# Patient Record
Sex: Female | Born: 1990 | Race: White | Hispanic: No | Marital: Single | Smoking: Former smoker
Health system: Southern US, Community
[De-identification: ages and names within clinical notes are randomized; demographics above are authoritative.]

## PROBLEM LIST (undated history)

## (undated) ENCOUNTER — Inpatient Hospital Stay (HOSPITAL_COMMUNITY): Payer: Self-pay

## (undated) ENCOUNTER — Ambulatory Visit: Admission: EM | Payer: Medicaid Other | Source: Home / Self Care

## (undated) DIAGNOSIS — F4310 Post-traumatic stress disorder, unspecified: Secondary | ICD-10-CM

## (undated) DIAGNOSIS — S0300XA Dislocation of jaw, unspecified side, initial encounter: Secondary | ICD-10-CM

## (undated) DIAGNOSIS — G43909 Migraine, unspecified, not intractable, without status migrainosus: Secondary | ICD-10-CM

## (undated) DIAGNOSIS — L501 Idiopathic urticaria: Secondary | ICD-10-CM

## (undated) DIAGNOSIS — Z8489 Family history of other specified conditions: Secondary | ICD-10-CM

## (undated) DIAGNOSIS — R569 Unspecified convulsions: Secondary | ICD-10-CM

## (undated) DIAGNOSIS — F319 Bipolar disorder, unspecified: Secondary | ICD-10-CM

## (undated) DIAGNOSIS — R42 Dizziness and giddiness: Secondary | ICD-10-CM

## (undated) DIAGNOSIS — N200 Calculus of kidney: Secondary | ICD-10-CM

## (undated) DIAGNOSIS — O24419 Gestational diabetes mellitus in pregnancy, unspecified control: Secondary | ICD-10-CM

## (undated) DIAGNOSIS — Z87898 Personal history of other specified conditions: Secondary | ICD-10-CM

## (undated) DIAGNOSIS — I10 Essential (primary) hypertension: Secondary | ICD-10-CM

## (undated) DIAGNOSIS — D171 Benign lipomatous neoplasm of skin and subcutaneous tissue of trunk: Secondary | ICD-10-CM

## (undated) DIAGNOSIS — Q7962 Hypermobile Ehlers-Danlos syndrome: Secondary | ICD-10-CM

## (undated) DIAGNOSIS — F419 Anxiety disorder, unspecified: Secondary | ICD-10-CM

## (undated) DIAGNOSIS — F329 Major depressive disorder, single episode, unspecified: Secondary | ICD-10-CM

## (undated) DIAGNOSIS — R000 Tachycardia, unspecified: Secondary | ICD-10-CM

## (undated) DIAGNOSIS — F32A Depression, unspecified: Secondary | ICD-10-CM

## (undated) DIAGNOSIS — J45909 Unspecified asthma, uncomplicated: Secondary | ICD-10-CM

## (undated) HISTORY — PX: ADENOIDECTOMY, TONSILLECTOMY AND MYRINGOTOMY WITH TUBE PLACEMENT: SHX5716

## (undated) HISTORY — PX: ABDOMINAL HYSTERECTOMY: SHX81

## (undated) HISTORY — PX: APPENDECTOMY: SHX54

## (undated) HISTORY — PX: TONSILLECTOMY: SUR1361

## (undated) HISTORY — DX: Calculus of kidney: N20.0

## (undated) HISTORY — PX: WISDOM TOOTH EXTRACTION: SHX21

## (undated) HISTORY — DX: Post-traumatic stress disorder, unspecified: F43.10

## (undated) HISTORY — PX: HIP SURGERY: SHX245

---
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
## 2008-01-20 ENCOUNTER — Emergency Department (HOSPITAL_COMMUNITY): Admission: EM | Admit: 2008-01-20 | Discharge: 2008-01-20 | Payer: Self-pay | Admitting: Emergency Medicine

## 2008-02-07 ENCOUNTER — Ambulatory Visit: Payer: Self-pay | Admitting: Family Medicine

## 2008-02-07 ENCOUNTER — Other Ambulatory Visit: Admission: RE | Admit: 2008-02-07 | Discharge: 2008-02-07 | Payer: Self-pay | Admitting: Family Medicine

## 2008-02-07 ENCOUNTER — Encounter: Payer: Self-pay | Admitting: Family Medicine

## 2008-02-07 DIAGNOSIS — F17200 Nicotine dependence, unspecified, uncomplicated: Secondary | ICD-10-CM | POA: Insufficient documentation

## 2008-02-07 HISTORY — DX: Nicotine dependence, unspecified, uncomplicated: F17.200

## 2008-02-14 ENCOUNTER — Encounter: Payer: Self-pay | Admitting: Family Medicine

## 2008-02-24 ENCOUNTER — Encounter: Payer: Self-pay | Admitting: Family Medicine

## 2008-02-26 ENCOUNTER — Telehealth: Payer: Self-pay | Admitting: *Deleted

## 2008-03-03 ENCOUNTER — Ambulatory Visit: Payer: Self-pay | Admitting: Family Medicine

## 2008-03-03 LAB — CONVERTED CEMR LAB: Beta hcg, urine, semiquantitative: NEGATIVE

## 2008-03-06 ENCOUNTER — Encounter: Payer: Self-pay | Admitting: *Deleted

## 2008-04-15 ENCOUNTER — Ambulatory Visit: Payer: Self-pay | Admitting: Family Medicine

## 2008-04-15 LAB — CONVERTED CEMR LAB: Beta hcg, urine, semiquantitative: NEGATIVE

## 2008-04-21 ENCOUNTER — Ambulatory Visit: Payer: Self-pay | Admitting: Family Medicine

## 2008-05-08 ENCOUNTER — Ambulatory Visit (HOSPITAL_COMMUNITY): Admission: RE | Admit: 2008-05-08 | Discharge: 2008-05-08 | Payer: Self-pay | Admitting: Family Medicine

## 2008-05-17 ENCOUNTER — Emergency Department (HOSPITAL_COMMUNITY): Admission: EM | Admit: 2008-05-17 | Discharge: 2008-05-17 | Payer: Self-pay | Admitting: Emergency Medicine

## 2008-05-18 ENCOUNTER — Telehealth (INDEPENDENT_AMBULATORY_CARE_PROVIDER_SITE_OTHER): Payer: Self-pay | Admitting: *Deleted

## 2008-06-10 ENCOUNTER — Encounter: Payer: Self-pay | Admitting: Family Medicine

## 2008-06-10 ENCOUNTER — Ambulatory Visit: Payer: Self-pay | Admitting: Family Medicine

## 2008-06-10 LAB — CONVERTED CEMR LAB
ALT: 22 units/L (ref 0–35)
AST: 17 units/L (ref 0–37)
Albumin: 4.6 g/dL (ref 3.5–5.2)
Alkaline Phosphatase: 94 units/L (ref 39–117)
BUN: 13 mg/dL (ref 6–23)
Basophils Absolute: 0 10*3/uL (ref 0.0–0.1)
Basophils Relative: 0 % (ref 0–1)
Beta hcg, urine, semiquantitative: NEGATIVE
CO2: 20 meq/L (ref 19–32)
Calcium: 9.8 mg/dL (ref 8.4–10.5)
Chloride: 107 meq/L (ref 96–112)
Creatinine, Ser: 0.76 mg/dL (ref 0.40–1.20)
Eosinophils Absolute: 0 10*3/uL (ref 0.0–0.7)
Eosinophils Relative: 1 % (ref 0–5)
Glucose, Bld: 111 mg/dL — ABNORMAL HIGH (ref 70–99)
HCT: 43.5 % (ref 36.0–46.0)
Hemoglobin: 13.8 g/dL (ref 12.0–15.0)
Lymphocytes Relative: 29 % (ref 12–46)
Lymphs Abs: 2.3 10*3/uL (ref 0.7–4.0)
MCHC: 31.7 g/dL (ref 30.0–36.0)
MCV: 89 fL (ref 78.0–100.0)
Monocytes Absolute: 0.6 10*3/uL (ref 0.1–1.0)
Monocytes Relative: 8 % (ref 3–12)
Neutro Abs: 5.1 10*3/uL (ref 1.7–7.7)
Neutrophils Relative %: 63 % (ref 43–77)
Platelets: 254 10*3/uL (ref 150–400)
Potassium: 4.7 meq/L (ref 3.5–5.3)
RBC: 4.89 M/uL (ref 3.87–5.11)
RDW: 14.9 % (ref 11.5–15.5)
Sodium: 140 meq/L (ref 135–145)
Total Bilirubin: 0.3 mg/dL (ref 0.3–1.2)
Total Protein: 7.6 g/dL (ref 6.0–8.3)
WBC: 8.1 10*3/uL (ref 4.0–10.5)

## 2008-06-11 ENCOUNTER — Encounter: Payer: Self-pay | Admitting: Family Medicine

## 2009-01-26 ENCOUNTER — Emergency Department (HOSPITAL_COMMUNITY): Admission: EM | Admit: 2009-01-26 | Discharge: 2009-01-26 | Payer: Self-pay | Admitting: Emergency Medicine

## 2009-04-16 ENCOUNTER — Encounter: Payer: Self-pay | Admitting: Family Medicine

## 2009-04-16 ENCOUNTER — Ambulatory Visit: Payer: Self-pay | Admitting: Family Medicine

## 2009-04-16 ENCOUNTER — Telehealth: Payer: Self-pay | Admitting: Family Medicine

## 2010-01-20 ENCOUNTER — Emergency Department: Payer: Self-pay | Admitting: Emergency Medicine

## 2010-02-02 ENCOUNTER — Ambulatory Visit: Payer: Self-pay | Admitting: Family Medicine

## 2010-02-02 ENCOUNTER — Encounter: Payer: Self-pay | Admitting: Family Medicine

## 2010-02-02 LAB — CONVERTED CEMR LAB
BUN: 11 mg/dL (ref 6–23)
CO2: 25 meq/L (ref 19–32)
Calcium: 9.3 mg/dL (ref 8.4–10.5)
Chloride: 104 meq/L (ref 96–112)
Creatinine, Ser: 0.69 mg/dL (ref 0.40–1.20)
Glucose, Bld: 103 mg/dL — ABNORMAL HIGH (ref 70–99)
Potassium: 4.1 meq/L (ref 3.5–5.3)
Sodium: 138 meq/L (ref 135–145)

## 2010-02-07 ENCOUNTER — Telehealth: Payer: Self-pay | Admitting: Family Medicine

## 2010-03-02 ENCOUNTER — Ambulatory Visit: Payer: Self-pay | Admitting: Family Medicine

## 2010-03-02 DIAGNOSIS — F4310 Post-traumatic stress disorder, unspecified: Secondary | ICD-10-CM

## 2010-03-02 HISTORY — DX: Post-traumatic stress disorder, unspecified: F43.10

## 2010-03-02 LAB — CONVERTED CEMR LAB: Beta hcg, urine, semiquantitative: NEGATIVE

## 2010-06-04 ENCOUNTER — Emergency Department: Payer: Self-pay | Admitting: Emergency Medicine

## 2010-06-24 ENCOUNTER — Emergency Department: Payer: Self-pay | Admitting: Emergency Medicine

## 2010-06-26 ENCOUNTER — Telehealth: Payer: Self-pay | Admitting: Family Medicine

## 2010-08-07 ENCOUNTER — Encounter: Payer: Self-pay | Admitting: *Deleted

## 2010-08-07 ENCOUNTER — Encounter: Payer: Self-pay | Admitting: Family Medicine

## 2010-08-16 NOTE — Miscellaneous (Signed)
Summary: needs postpartum pap  Clinical Lists Changes   Pt with hx of colpo with CIN I, then prenatal pap with [facility].  Given pt's age, will repeat pap 3 months postpartum and depending on results, will consider repeat colpo.

## 2010-08-16 NOTE — Assessment & Plan Note (Signed)
Summary: hematuria, lt side pain, pregnant/ls    PCP:  Altamese Cabal MD   History of Present Illness: S: Patient is a 20 y/o G2P1 at 13 weeks here todya for question of recent bleeding. Patient went to the bathroom about 1.5 hours ago and noticed blood tinged fluid in water and she came immediately to the [facility].  Patient also complains of intermittant l-sided pain in lower abdomen that is sharp.  No recent intercourse. No further bleeding since that episode. Denies cramping and ctx. No dysuria or flank pain or fevers.  FHTs 160s and fundal height 13 today    Past Medical History:    Reviewed history from 05/26/2007 and no changes required:        mild zoster in March 2007       SVD 1/08       depressive disorder NOS       abnl pap-LGSIL 2007, s/p colpo-->CIN I       ASCUS-H pap 10/08 (patient pregnant) needs f/u pap 3 months postpartum  Past Surgical History:    Reviewed history from 05/03/2007 and no changes required:       none      Physical Exam  General:     Well-developed,well-nourished,in no acute distress; alert,appropriate and cooperative throughout examination. tearful and anxious Abdomen:     Bowel sounds positive,abdomen soft and non-tender without masses, organomegaly. Gravid 13 cm uterus. FHTs 160s. No CVAT tenderness Genitalia:     Normal introitus for age, no external lesions, no vaginal discharge, mucosa pink and moist, no vaginal or cervical lesions, no vaginal atrophy, no friaility or hemorrhage, 13 cm  uterus size and position, no adnexal masses or tenderness. cervix appears closed Skin:     Intact without suspicious lesions or rashes Psych:     tearful and slightly anxious.      Impression & Recommendations:  Problem # 1:  HEMATURIA UNSPECIFIED (ICD-599.70) Assessment: New UA normal and cervix closed and no evidence of bleeding. Reassured patient.  She will call if symptoms return. Discussed case with preceptor. Orders: Urinalysis-[facility] (00000) [facility]-  Est Level  3 (16109)    Patient Instructions: 1)  you cervix is closed and there were no signs of bleeing on your internal exam or in your urine. 2)  You are still dehydrated and need to drink water 3)  If this happens again, call the office or go to the [facility] if it is after hours    ] Laboratory Results   Urine Tests  Date/Time Received: July 03, 2007 3.48  PM  Date/Time Reported: July 03, 2007 4:07 PM   Routine Urinalysis   Color: yellow Appearance: Clear Glucose: negative   (Normal Range: Negative) Bilirubin: negative   (Normal Range: Negative) Ketone: moderate (40)   (Normal Range: Negative) Spec. Gravity: 1.015   (Normal Range: 1.003-1.035) Blood: negative   (Normal Range: Negative) pH: 7.0   (Normal Range: 5.0-8.0) Protein: negative   (Normal Range: Negative) Urobilinogen: 0.2   (Normal Range: 0-1) Nitrite: negative   (Normal Range: Negative) Leukocyte Esterace: negative   (Normal Range: Negative)    Comments: ...............test performed by......Marland KitchenBonnie A. [place], [place] (ASCP)

## 2010-08-16 NOTE — Progress Notes (Signed)
Summary: triage  Phone Note Call from Patient Call back at [phone]   Caller: mom-Jennifer Summary of Call: pt is having sore throat for 2 days - wants to come in today Initial call taken by: De Nurse,  April 16, 2009 10:03 AM  Follow-up for Phone Call        mom states she has had this for 2-3 days & has lost her voice. needs note for work. appt at 3 as work in Follow-up by: Golden Circle RN,  April 16, 2009 10:09 AM
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
## 2010-08-16 NOTE — Assessment & Plan Note (Signed)
Summary: NOB   OB Initial Intake Information    Positive HCG by: at [facility]    Race: White    Marital status: Single    Occupation: Arts development officer    Number of children at home: 1    [facility]    Newborn's physician: samuhel  FOB Information    Husband/Father of baby: dustin giles    FOB occupation warehouse    FOB Comments: supportive  Menstrual History    LMP (date): 03/29/2007    EDC by LMP: 01/03/2008    Best Working EDC: 01/03/2008    LMP - Character: normal    LMP - Reliable? : Yes    Menarche: 10 years    Menses interval: 30 days    Menstrual flow 6 days    On BCP's at conception: yes    Pre Pregnancy Weight: 167 lbs.    Symptoms since LMP: nausea    Other symptoms: menstrual cramping pains. no bleeding  Past Medical History:    Reviewed history from 05/03/2007 and no changes required:        mild zoster in March 2007       SVD 1/08       depressive disorder NOS       abnl pap-LGSIL 2007, s/p colpo,  needs repeat  pap 11/08  Past Surgical History:    Reviewed history from 05/03/2007 and no changes required:       none  Past Pregnancy History    Gravida:     2    Term Births:     1    Premature Births:   0    Living Children:   1    Para:       1    Mult. Births:     0    Aborta:     0    Elect. Ab:     0  Pregnancy # 1    Delivery date:     08/08/2006    Weeks Gestation:   39    Preterm labor:     no    Delivery type:     NSVD    Hours of labor:     24    Anesthesia type:     epidural    [facility]    Infant Sex:     Female    Birth weight:     6 lbs 3 ounces    Name:     Jada    Comments:     cleft palate  Family History:    obesity, tobacco use, sister has seizure d/o and developmental delay    other sister has Type II DM    child has cleft palate    mom DM II, etoh abuse    great GF and GM - CAD, CHF  Genetic History    Father of baby:   dustin giles     Thalassemia:     mother: no    father: no    Neural tube defect:   mother: no   father: no    Down's Syndrome:   mother: no   father: no    Tay-Sachs:     mother: no   father: no    Sickle Cell Dz/Trait:   mother: no   father: no    Hemophilia:     mother: no   father: no    Muscular  Dystrophy:   mother: no   father: no    Cystic Fibrosis:   mother: no   father: no    Huntington's Dz:   mother: no   father: no    Mental Retardation:   mother: yes   father: no    Fragile X:     mother: no   father: no    Other Genetic or       Chromosomal Dz:   mother: no   father: no    Child with other       birth defect:     mother: yes   father: no    > 3 spont. abortions:   mother: no    Hx of stillbirth:     mother: no  Additional Genetic Comments:    patients first child had cleft palate patients sister has mental retardation(white matter delay) and seizure discorder  Flowsheet View for Follow-up Visit    Estimated weeks of       gestation:     6 5/7    Weight:     172    Blood pressure:   114 / 69  Physical Examination  Vital Signs:  BP (upright): 114/69  Wt: 172  Last Ht: 62.5 (11/02/2006)  General Exam:  Constitutional:    alert, no acute distress, well hydrated, well developed, well nourished, appropriate dress, and OBESE APPEARING.   Skin:    normal turgor, normal color, no rashes, no lesions, and no unusual bruising.   Head:    atraumatic and normocephalic.   Eyes:    EOM intact, PERRLA, and no injection.   Mouth:    good dentition, no erythema, no exudates, and no lesions.   Neck:    supple.   Cardiovascular:    RRR, no murmurs, no gallops, peripheral pulses intact, and no edema.   Respiratory:    no respiratory distress and clear to auscultation.   Abdomen:    gravid, normal BS, and no hepatosplenomegaly.   Psychiatric:    oriented to all spheres, affect and mood appropriate, normal interaction, and good eye contact.    Impression & Recommendations:  Problem # 1:  PREGNANCY, MULTIGRAVIDA  (ICD-V22.1) Assessment: Comment Only Patient about 6 weeks. Will do 1 hr GTT at next visit since she has h/o GDM. Will refer to MFM for integrative screening Orders: Prenatal-[facility] ([address]) Urine Culture-[facility] ([id number]) HIV-[facility] ([id number]) GC/Chlamydia-[facility] (87591/87491) Pap Smear-[facility] ([id number]) Medicaid OB visit - [facility] ([id number]) Obstetric Referral (Obstetric)   Patient Instructions: 1)  please f/u in 4 weeks 2)  we will do a sugar test at that time 3)  call if you have any questions or concerns Prenatal Visit    FOB name: dustin giles Concerns noted: patient having mild menstrual-like cramps. no vaginal bleeding EDC Confirmation:    New working [facility]: 01/03/2008    LMP reliable? Yes    Last menses onset (LMP) date: 03/29/2007    EDC by LMP: 01/03/2008

## 2010-08-16 NOTE — Miscellaneous (Signed)
Summary: PCP Change  Pt requested a female provider.  Have reassigned her and her 2 children to Dr. Helane Rima. Dennison Nancy RN  March 06, 2008 9:34 AM

## 2010-08-16 NOTE — Assessment & Plan Note (Signed)
Summary: IUD insertion/ACM   Vital Signs:  Patient Profile:   20 Years Old Female Height:     62.5 inches (158.75 cm) Weight:      175.8 pounds Temp:     97.9 degrees F Pulse rate:   97 / minute BP sitting:   109 / 75  (left arm)  Pt. in pain?   no  Vitals Entered By: Alphia Kava (March 03, 2008 9:02 AM)              Is Patient Diabetic? No     PCP:  Myrtie Soman  MD  Chief Complaint:  IUD Insertion.  History of Present Illness: here for iud--previously has tried ocp--desires iud. No questions.  Post partum--having some menses now. Smoker.    Past Medical History:     mild zoster in March 2007    NSVD x 2 1/08 and 6/09    depressive disorder NOS    abnl pap-LGSIL 2007, s/p colpo-->CIN I    ASCUS-H pap 10/08 (patient pregnant) needs f/u pap postpartum  Past Surgical History:    Reviewed history from 05/03/2007 and no changes required:       none    Risk Factors:     Counseled to quit/cut down tobacco use:  yes     Physical Exam  Genitalia:     Bright red blood oozing in small quantity from os.normal introitus, no external lesions, no vaginal or cervical lesions, normal uterus size and position, and no adnexal masses or tenderness.   Additional Exam:     Patient given informed consent for IUD insertion. She has no questions. Signed copy in chart. Patient placed in lithotomy position. Sterile prep and sterile speculum/equipment used. Cervix cleansed X 3 with betadine. Tenaculum used to secure cervix by placement in anterior lip of cervix. Some blood oozing from tthe os noted. Uterine sound used and sounded to 6 and then IUD placed without problems.IUD dislodged wit attempt to trim strings and so a second Mirena was inserted using continuous steril technique, IUD strings trimmed to 2 inches and patient taught how to check for them. Her os was relatively open and so I think she is at risk for early expulsion. We will have her come back in 2 weeks to see Dr  Constance Goltz for a string check.     Impression & Recommendations:  Problem # 1:  CONTRACEPTIVE MANAGEMENT (ICD-V25.09)  Orders: U Preg-[facility] (29518) IUD insert- [facility] (84166)   Complete Medication List: 1)  Ortho Tri-cyclen (28) 0.035 Mg Tabs (Norgestimate-ethinyl estradiol) .... One by mouth daily; dispense one month's supply 2)  Ibuprofen 400 Mg Tabs (Ibuprofen) .... One by mouth 3-4 times per day as needed for back pain   Patient Instructions: 1)  Please schedule a follow-up appointment in 2 weeks for IUD string check with Dr. Constance Goltz (okay to double book). 2)  One of the most important things you can do for your health is to STOP SMOKING.  When you are ready to quit smoking, please call the "quit line" at 1-800-QUIT-NOW (580-538-2160).  You can get more information about the quit line at [email or web address].  It is free!   ] Laboratory Results   Urine Tests  Date/Time Received: March 03, 2008 9:22 AM  Date/Time Reported: March 03, 2008 9:32 AM      Urine HCG: negative Comments: ...........test performed by...........Marland KitchenTerese Door, CMA     Appended Document: IUD insertion/ACM 1st IUD wasted when attempting to cut strings.  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  IUD lot # [id number] exp 01/12................................ JESSICA FLEEGER CMA  March 03, 2008 1:01 PM    Clinical Lists Changes  Orders: Added new Test order of IUD Supply-[facility] (I6270) - Signed Added new Test order of IUD Supply-[facility] (J5009) - Signed

## 2010-08-16 NOTE — Progress Notes (Signed)
Summary: Letter for [facility]  Phone Note Call from Patient Call back at Home Phone [phone]   Summary of Call: Pt is requesting a letter to prove she is pregnant to take to [facility].  Pt has an appt at 1pm this afternoon with them. Initial call taken by: Haydee Salter,  May 13, 2007 8:35 AM  Follow-up for Phone Call        LVM informing pt. Letter ready to be pick up @ front desk Follow-up by: Tomasa Rand,  May 13, 2007 9:07 AM

## 2010-08-16 NOTE — Assessment & Plan Note (Signed)
Summary: cannot hear our of ear x1wk wp   Vital Signs:  Patient Profile:   20 Years Old Female Height:     62.5 inches (158.75 cm) Weight:      178 pounds Temp:     98.4 degrees F Pulse rate:   109 / minute BP sitting:   116 / 74  Pt. in pain?   no  Vitals Entered By: Jone Baseman CMA (Nov 22, 2007 2:33 PM)                Audiometry Screening        Left  500 hz: 20db 1000 hz: 20db 2000 hz: 20db 4000 hz: 20db Right  500 hz: No Response 1000 hz: No Response 2000 hz: No Response 4000 hz: No Response   Hearing Testing Entered By: Jone Baseman CMA (Nov 22, 2007 2:40 PM)   PCP:  Altamese Cabal MD  Chief Complaint:  can't hear out of right ear x 10 days.  History of Present Illness: S: Patient is a 20 y/o female diagnosed with acute OM with TM rupture about 10 days ago. Recently finished course of oral antibiotic. Still having drainage from ear. Denies infection and pain. patient has h/o tympanosty tubes x 3 as a child. She has had 2 ear infections in the past 3 years. Currently smoking 1-2 cigarettes a day.       Risk Factors:  Tobacco use:  current    Cigarettes:  Yes -- occ pack(s) per day    Counseled to quit/cut down tobacco use:  yes    Physical Exam  General:     Well-developed,well-nourished,in no acute distress; alert,appropriate and cooperative throughout examination. pregnant Head:     normocephalic and atraumatic.   Ears:     R TM with abut 15 % TM rupture and some fluid drainage. left TM normal    Impression & Recommendations:  Problem # 1:  HEARING LOSS, RIGHT EAR (ICD-389.9) Assessment: New patient has decreased hearing from OM that resulted in TM rupture about 10 days ago. Will refer to ENT [phone][facility] ENT Referral (ENT) [facility]- Est Level  3 (76734)     ]

## 2010-08-16 NOTE — Assessment & Plan Note (Signed)
Summary: LLQ pain x 2 days   Vital Signs:  Patient Profile:   20 Years Old Female Height:     62.5 inches (158.75 cm) Weight:      165.1 pounds BMI:     29.82 Temp:     98.3 degrees F Pulse rate:   106 / minute BP sitting:   102 / 64  Pt. in pain?   no  Vitals Entered By: Golden Circle RN (January 25, 2007 10:05 AM)                PCP:  Altamese Cabal MD  Chief Complaint:  llq pain x 2-3 days-worse with movement and better with rest.  History of Present Illness: S Left groin pain x 2-3 days.  No trauma or overexertion per patient report.  Pain worse when lifting baby .  Patient has not taken anything for pain.  Actually it is getting better.   Patient also notes she is thinking about chagning contraceptive methods and is interested in the Implanon.     Family History:    Reviewed history from 09/13/2006 and no changes required:       obesity, tobacco use, sister has seizure d/o and developmental delay       other sister has Type II DM       child has cleft palate       mom DM II, etoh abuse  Social History:    Reviewed history from 09/13/2006 and no changes required:       G1P1 (2008, girl)  Poor school performance.  Has BF who has graduated from [employer], works at [employer].  Monogamous relationship, began sexual activity 2004.  H/o tobacco use, none currently.  No EtOH, recreational drugs.     Physical Exam  General:      Well appearing adolescent,no acute distress Musculoskeletal:      full ROM in left hip.  No pain on exam    Impression & Recommendations:  Problem # 1:  SPRAIN/STRAIN  NEC (ICD-848.8) Assessment: New Strained groin. See patient instructions Orders: [facility]- Est Level  3 (54098)    Patient Instructions: 1)  Yhave pulled a muscle in your left groin. 2)  Ace wrap that leg during the day and ice at ngiht. 3)  Exercises :  lay on side and lift leg up multiple times.  THen stand up and move leg in opposite direction.  Also sweeze ball betweeen  you legs.Marland Kitchen 4)  If you decide you want the implanon please call office and ask them to get me to do a referal 5)  F/U with me in 2 months

## 2010-08-16 NOTE — Letter (Signed)
Summary: Out of Work  [facility]  [address]   [place], [place] [address]   Phone: [phone]  Fax: [phone]    April 16, 2009   Employee:  ALAYZHA AN    To Whom It May Concern:   For Medical reasons, please excuse the above named employee from work for the following dates:  Start:   April 15, 2009 End:  April 17, 2009  If you need additional information, please feel free to contact our office.       Sincerely,    Romero Belling MD

## 2010-08-16 NOTE — Assessment & Plan Note (Signed)
Summary: diarrhea wp   Vital Signs:  Patient Profile:   20 Years Old Female Height:     62.5 inches (158.75 cm) Weight:      164.56 pounds BMI:     29.73 Temp:     98.1 degrees F Pulse rate:   94 / minute BP sitting:   108 / 78  (left arm) Cuff size:   regular  Pt. in pain?   yes    Location:   lower abdomen    Intensity:   5    Type:       cramping  Vitals Entered By: Dennison Nancy RN (June 10, 2008 9:55 AM)              Is Patient Diabetic? No     PCP:  Helane Rima MD  Chief Complaint:  Work in for diarrhea.  History of Present Illness: History of present illness is detailed by problem in assessment and plan.    Updated Prior Medication List: LOPERAMIDE HCL 2 MG CAPS (LOPERAMIDE HCL) 1 tab by mouth after every loose stool, maximum of 4 per day PROCTOFOAM HC 1-1 % FOAM (HYDROCORTISONE ACE-PRAMOXINE) Use per manufacturer's instructions as needed for anal pain.  Disp #1 bottle.  Current Allergies (reviewed today): ! CODEINE    Risk Factors:     Counseled to quit/cut down tobacco use:  yes    Physical Exam  General:     Well-developed,well-nourished,in no acute distress; alert,appropriate and cooperative throughout examination Head:     Normocephalic and atraumatic without obvious abnormalities. No apparent alopecia or balding. Mouth:     Moist mucous membranes. Abdomen:     Soft, nontender, normoactive bowel sounds with no masses or hepatosplenomegaly.    Impression & Recommendations:  Problem # 1:  ABDOMINAL CRAMPS (ICD-789.00) Assessment: New 1 month history of abdominal cramps and diarrhea.  Had an episode of vomiting and diarrhea on 10/31 and was seen in ED--told she had gastroenteritis that would be self-limiting.  Nausea persisted for 2 days after that and vomiting resolved.  However, patient has had persistent postprandial diarrhea since.  Occurs approximately three times a day after every meal.  Diarrhea is watery, nonbloody, brown  without fat.  No history of camping or drinking from streams.  Preceded by middle lower abdominal cramping that is relieved after defecation.  Has IUD (placement confirmed by Ultrasound), is sexually active with 1 partner, no vaginal discharge or bleeding.  Periods are monthly and last 2 weeks, no dysmenorrhea.  UPT is negative today.  No fever/chills.  Drinking plenty of fluids.  Has perianal pain during defecation.  A/P:  Unclear cause, but is low risk for parasites.  Consider IBS given relief with defecation and lack of systemic symptoms.    Will check stool O&P, WBC, and culture.  Loperamide for relief, Proctofoam for anal pain, maintain good hydration.  If persists, would consider testing for Celiac disease as well. Orders: U Preg-[facility] ([id number])   Complete Medication List: 1)  Loperamide Hcl 2 Mg Caps (Loperamide hcl) .Marland Kitchen.. 1 tab by mouth after every loose stool, maximum of 4 per day 2)  Proctofoam Hc 1-1 % Foam (Hydrocortisone ace-pramoxine) .... Use per manufacturer's instructions as needed for anal pain.  disp #1 bottle.  Other Orders: Comp Met-[facility] [id number]) CBC w/Diff-[facility] ([id number]) [facility]- Est Level  3 (95284)  Future Orders: Culture, Stool- [facility] ([id number]) ... 06/10/2009 Culture, Stool Giardia/Cryptosporidium-[facility] [id number]) ... 06/10/2009 Stool, WBC/Lactoferrin-[facility] ([id number]) ... 06/10/2009   Patient Instructions: 1)  We  are checking some stool studies today to determine why you are having so much diarrhea.  Our lab will give you instructions. 2)  Please continue to drink plenty of fluids to avoid dehydration. 3)  I have prescribed loperamide which you can use to try to relieve the diarrhea. 4)  I have also prescribed proctofoam which can help with the anal pain/burning. 5)  Please schedule a follow-up appointment in 1 month with Dr. Earlene Plater.   Prescriptions: PROCTOFOAM HC 1-1 % FOAM (HYDROCORTISONE ACE-PRAMOXINE) Use per manufacturer's instructions as needed for anal pain.   Disp #1 bottle.  #1 x 0   Entered and Authorized by:   Romero Belling MD   Signed by:   Romero Belling MD on 06/10/2008   Method used:   Electronically to        [facility] [address] [phone]* (retail)       [address]       [place], [place]  [address]       Ph: [phone] or [phone]       Fax: [phone]   RxID:   [id number] LOPERAMIDE HCL 2 MG CAPS (LOPERAMIDE HCL) 1 tab by mouth after every loose stool, maximum of 4 per day  #100 x 0   Entered and Authorized by:   Romero Belling MD   Signed by:   Romero Belling MD on 06/10/2008   Method used:   [facility]        [facility] [address] [phone]* (retail)       [address]       [place], [place]  [address]       Ph: [phone] or [phone]       Fax: [phone]   RxID:   [id number]  ]  Laboratory Results   Urine Tests  Date/Time Received: June 10, 2008 10:04 AM  Date/Time Reported: June 10, 2008 10:09 AM     Urine HCG: negative Comments: ...........test performed by...........Marland KitchenTerese Door, CMA

## 2010-08-16 NOTE — Assessment & Plan Note (Signed)
Summary: f/u ob  Prenatal Visit Concerns noted: tired. checking blood sugar 120 at the highest fasting.   following diabetic diet.  Flowsheet View for Follow-up Visit    Estimated weeks of       gestation:     12 6/7    Weight:     163    Blood pressure:   112 / 67    Urine protein:       N    Urine glucose:    N    Hx headache?     No    Nausea/vomiting?   No    Edema?     0    Bleeding?     no    Leakage/discharge?   no    Fetal activity:       no    Labor symptoms?   no    Fundal height:      12    FHR:       150s    Fetal position:      N/A    Taking Vitamins?   Y    Smoking PPD:   n/a    Next visit:     4 wk  Physical Examination  Vital Signs:  BP (upright): 112/67  Wt: 163  Last Ht: 62.5 (11/02/2006)  General Exam:  Constitutional:    alert, no acute distress, well hydrated, well developed, well nourished, and appropriate dress.     Impression & Recommendations:  Problem # 1:  PREGNANCY, MULTIGRAVIDA (ICD-V22.1) Assessment: Comment Only Patient failed early 1 hr GTT (204).  Will refer to high risk clinic for education and treatment. Patient's weight also down 5 lbs but she is eating well and no nausea or vomitting. Discussed with preceptor ...will monitor.  Orders: UA Glucose/Protein-[facility] ([id number]) Medicaid OB visit - [facility] (16109)   Other Orders: Obstetric Referral (Obstetric)  PAP Screening:    Last PAP smear:  05/16/2007  Osteoporosis Risk Assessment:  Risk Factors for Fracture or Low Bone Density:   Race (White or Asian):     yes   Smoking status:       never  Immunization & Chemoprophylaxis:    Influenza vaccine: Fluvax 3+  (05/03/2007)  Patient Instructions: 1)  we are making you an appointment at the highrisk diabetic clinic...they will call you with details 2)  see me in 4 weeks.Marland KitchenMarland KitchenMarland KitchenI believe I will be able to follow you as well  OB Initial Intake Information    Positive HCG by: at [facility]    Race: White    Marital status: Single  Occupation: home maker    Number of children at home: 1    [facility]    Newborn's physician: samuhel  FOB Information    Husband/Father of baby: dustin giles    FOB occupation warehouse    FOB Comments: supportive  Menstrual History    LMP (date): 03/29/2007    LMP - Character: normal    Menarche: 10 years    Menses interval: 30 days    Menstrual flow 6 days    On BCP's at conception: yes   OB Ultrasound Data Entry:    Ultrasound Date:     06/17/2007    Fetal number:         1    Cardiac Motion:       Yes    Anomalies:       None noted    Placenta Location:  Anterior Dating:    Gestational age:     27 weeks, 3 days    EDC by sonogram:     01/03/2008 (consistent with current EDC) Best Working [facility]:    01/03/2008  Prenatal Lab Monitoring, Entry, and Tracking  24-28 Week Lab:  TEST                  VALUE          DATE           REVIEWED  GTT:                  abnormal       06/21/2007     yes

## 2010-08-16 NOTE — Progress Notes (Signed)
   Phone Note Call from Patient   Caller: Patient Summary of Call: PT SCHEDULED SDA at 1:15pm for migraine Initial call taken by: Dedra Skeens CMA,,  February 18, 2007 11:17 AM

## 2010-08-16 NOTE — Progress Notes (Signed)
Summary: Triage  Phone Note Call from Patient Call back at Home Phone [phone]   Reason for Call: Talk to Nurse Summary of Call: Pt wants to be seen today to have her cervix checked....she has been referred to high risk, therefore I'm not sure if we can see her here for this. Initial call taken by: Haydee Salter,  December 17, 2007 9:10 AM  Follow-up for Phone Call        37 weeks. c/o cramps off & on . was seen yesterday at high risk. wants to be checked to see if her cervix has dialated further. asked Dr. Deirdre Priest. she needs to call high risk clinic and be checked there. pt ok with answer Follow-up by: Golden Circle RN,  December 17, 2007 9:10 AM

## 2010-08-16 NOTE — Assessment & Plan Note (Signed)
Summary: migraine/shaw/dpg  Medications Added VOLTAREN 75 MG TBEC (DICLOFENAC SODIUM) One tab by mouth two times a day PRN        Vital Signs:  Patient Profile:   20 Years Old Female Height:     62.5 inches (158.75 cm) Weight:      167.6 pounds Temp:     98.4 degrees F Pulse rate:   81 / minute BP sitting:   105 / 67  Pt. in pain?   yes    Location:   head    Intensity:   4  Vitals Entered By: Altamese Dilling CMA, (February 19, 2007 8:59 AM)                PCP:  Altamese Cabal MD  Chief Complaint:  Headache.  History of Present Illness: 20 y/o WF with  headaches - These have been going on for the last 6 days and are associated with lightheadedness as an aura as well as lightheadedness when most severe later on.  It is worst in the middle of the day.  She has tried Tylenol and it doesn't help.  + phono/photophobia.  3-8/10.  NO syncope.  Located in left temple and feels like a "hammer to her brain."  FIrst headaches ever like these.        Physical Exam  General:      Well appearing adolescent,no acute distress Head:      normocephalic and atraumatic  Eyes:      PERRL, EOMI  Ears:      TM's pearly gray with cone, canals clear  Nose:      Clear without erythema, edema or exudate  Mouth:      Clear without erythema, edema or exudate  Neck:      supple without adenopathy  Lungs:      Clear to ausc, no crackles, rhonchi or wheezing, no grunting, flaring or retractions  Heart:      RRR without murmur  Neurologic:      Neurologic exam grossly intact  Skin:      intact without lesions, rashes  Psychiatric:      alert and cooperative     Medications Added to Medication List This Visit: 1)  Voltaren 75 Mg Tbec (Diclofenac sodium) .... One tab by mouth two times a day prn   Patient Instructions: 1)  You can take Voltaren for the migraines one tab every 12 hours as needed. 2)  Followup routinely at next routine available with your 1o MD Dr Iven Finn  to see how the treatment has gone.          Appended Document: Orders Update  Since missed previously:  A/P:  Migraine:  Worsened.  WIll prescribe Voltaren as needed.  Consider tryptan if this continues.   Clinical Lists Changes  Orders: Added new Test order of [facility]- Est Level  3 ([id number]) - Signed

## 2010-08-16 NOTE — Miscellaneous (Signed)
Summary: DNKA  Clinical Lists Changes 

## 2010-08-16 NOTE — Assessment & Plan Note (Signed)
Summary: OB f/u  Prenatal Visit Concerns noted: Doing well. No n/v/abd pain or bleeding. interested in integrative screening EDC Confirmation:    New working [facility]: 01/03/2008 Ultrasound Dating Information:    First U/S on 05/26/2007   Gest age: [redacted]w[redacted]d   EDC: 01/03/2008.  Flowsheet View for Follow-up Visit    Estimated weeks of       gestation:     9 3/7    Weight:     168    Blood pressure:   119 / 16    Urine protein:       Tr    Urine glucose:    N    Hx headache?     No    Nausea/vomiting?   No    Edema?     0    Bleeding?     no    Leakage/discharge?   no    Fetal activity:       no    Labor symptoms?   no    Fundal height:      9    FHR:       180s    Fetal position:      N/A    Taking Vitamins?   Y    Smoking PPD:   n/a    Next visit:     4 wk  Physical Examination  Vital Signs:  BP (upright): 119/16  Wt: 168  Last Ht: 62.5 (11/02/2006)  General Exam:  Constitutional:    alert and no acute distress.    Impression & Recommendations:  Problem # 1:  PREGNANCY, MULTIGRAVIDA (ICD-V22.1) Assessment: Comment Only Nl pregnancy thus far. FHTs detected today. Patient has appt with MFM. Will have patient come in for early 1 hour GTT within next week.  Abnormal pap reviewed with attendings. Given age and recent colpo will perfrom pap 3 months postpartum. Orders: [facility]A Glucose/Protein-[facility] (81002)   Other Orders: Medicaid OB visit - [facility] (16109)  Future Orders: Glucose 1 hr-[facility] ([id number]) ... 06/12/2008  Patient Instructions: 1)  we have set you up with a referal for integrative screening and the nurse will give you your appt date and time 2)  please schedule you sugar test in the next 7 days 3)  please schedule patient a follow-up at [facility] clinic in 4 weeks     OB Initial Intake Information    Positive [facility]CG by: at [facility]    Race: White    Marital status: Single    Occupation: home maker    Number of children at home: 1    [facility]  Newborn's physician: samuhel  FOB Information    Husband/Father of baby: dustin giles    FOB occupation warehouse    FOB Comments: supportive  Menstrual History    LMP (date): 03/29/2007    Best Working EDC: 01/03/2008    LMP - Character: normal    Menarche: 10 years    Menses interval: 30 days    Menstrual flow 6 days    On BCP's at conception: yes   Risk Factors:     Genetic History  Infection Risk History    Infection Risk History Reviewed:    High Risk Hepatitis B: no    Immunized against Hepatitis B: no    Exposure to TB: no    Patient with history of Genital Herpes: no    Sexual partner with history of Genital Herpes: no    History of STD (GC, Chlamydia,  Syphilis, HPV): no    Rash, Viral, or Febrile Illness since LMP: no    Exposure to Cat Litter: no    Chicken Pox Immune Status: Hx of Disease: Immune    History of Parvovirus (Fifth Disease): no    Occupational Exposure to Children: none  Environmental Exposures    Environmental Exposures Reviewed:    Xray Exposure since LMP: no    Chemical or other exposure: no    Medication, drug, or alcohol use since LMP: no

## 2010-08-16 NOTE — Progress Notes (Signed)
Summary: Requesting appt  Phone Note Call from Patient Call back at Home Phone [phone]   Reason for Call: Talk to Nurse Summary of Call: pt is requesting to speak with RN, sts she went to her appt today with the highrisk clinic and they told her to call here to schedule a colposcopy appt Initial call taken by: ERIN LEVAN,  July 22, 2007 3:43 PM  Follow-up for Phone Call        Would you like for Michelle [name] to set this up? Follow-up by: Jone Baseman CMA,  July 22, 2007 5:08 PM  Additional Follow-up for Phone Call Additional follow up Details #1::        I talked to Dr. [place] when we got her pap back and she said not to do one until after she delivers but I'll ask again.  The patient is under 20 and had a colpo last year. Additional Follow-up by: Altamese Cabal MD,  July 22, 2007 9:05 PM    Additional Follow-up for Phone Call Additional follow up Details #2::    [facility] tell [facility] what I just wrote above (patient does not need colpo)  Yes, we should go with the original plan for post partum eval.  [facility] clinic informed. ...................................................................Michelle [name] CMA  July 30, 2007 3:37 PM

## 2010-08-16 NOTE — Progress Notes (Signed)
Summary: Triage  Phone Note Call from Patient Call back at [phone]   Reason for Call: Talk to Nurse Summary of Call: pt was seen over the weekend for stomach flu, was told to f/u this week. Pt wants to know if she has to be seen? Initial call taken by: Knox Royalty,  May 18, 2008 9:41 AM  Follow-up for Phone Call        Pt states she was seen at ED Saturday evening.  States she has been having diarrhea for apprx 2 weeks, and started vomiting sat- which is why she went to ED.  ED told pt she had stomach flu and gave her IV fluids.  Pt states she is feeling better - feeling a little nauseated still, not vomiting, but still having diarrhea.  Advised drink plenty of clear liquids - avoid fried foods- eat toast, rice, crackers when hungry.  Call back tomorrow if no better. Follow-up by: AMY MARTIN RN,  May 18, 2008 9:57 AM

## 2010-08-16 NOTE — Progress Notes (Signed)
Summary: Question  Phone Note Call from Patient   Reason for Call: Talk to Doctor Summary of Call: pt would like to speak with Dr Iven Finn about a test that she is having done Initial call taken by: Haydee Salter,  October 02, 2006 11:23 AM  Follow-up for Phone Call        Called patient.  Addressed her questions Follow-up by: Altamese Cabal MD,  October 02, 2006 12:08 PM

## 2010-08-16 NOTE — Letter (Signed)
Summary: Patient Health Questionnaire   Patient Health Questionnaire   Imported By: Clydell Hakim 03/04/2010 14:58:22  _____________________________________________________________________  External Attachment:    Type:   Image     Comment:   External Document

## 2010-08-16 NOTE — Progress Notes (Signed)
Summary: Triage  Phone Note Call from Patient Call back at [phone]   Summary of Call: pt wants to speak with a rn, wouldn't elaborate. Initial call taken by: Haydee Salter,  February 26, 2008 11:40 AM  Follow-up for Phone Call        left message Follow-up by: Golden Circle RN,  February 26, 2008 11:44 AM  Additional Follow-up for Phone Call Additional follow up Details #1::        started ocps 2 weeks ago after her 6 wk post partum exam.  bleeding.  since she started the pills. checked with Dr. Jennette Kettle. states that is normal & to keep taking the pills. should even out after 1-2 cycles. pt ok with answer Additional Follow-up by: Golden Circle RN,  February 26, 2008 11:46 AM

## 2010-08-16 NOTE — Assessment & Plan Note (Signed)
Summary: migraines/el  Medications Added IBUPROFEN 800 MG TABS (IBUPROFEN) one every 6 hrs if needed for headache. Take with food YASMIN 28 3-0.03 MG TABS (DROSPIRENONE-ETHINYL ESTRADIOL) Take 1 tablet by mouth once a day        Vital Signs:  Patient Profile:   20 Years Old Female Height:     62.5 inches (158.75 cm) Weight:      170 pounds Temp:     98.9 degrees F Pulse rate:   120 / minute BP sitting:   116 / 72  Pt. in pain?   yes    Location:   headache    Intensity:   2-3  Vitals Entered By: Jone Baseman CMA (May 01, 2007 4:00 PM)                  PCP:  Altamese Cabal MD  Chief Complaint:  migraines.  History of Present Illness: 20 y/o female 1. headaches-patient seen previously and diagnosed with migraine. Voltaren didnt help. Patient describes headaches that feel like a "hammer jabbing into her brain". She is getting 1-2 per week.  Questionable if they are associated with a true aura (patient sometimes seems black spots before HA).  Patient denies associated vomiting, but sometimes gets nauseous.   2. depression- Patient reports better mood. No longer taking paxil (took herself off) 3. nausea- Patient has felt nauseated last few mornings.  She reports last LMP was 9/12.  Patient feels like she did with last pregnancy    Past Medical History:    Reviewed history from 09/24/2006 and no changes required:        mild zoster in March 2007       SVD 1/08       depressive disorder NOS       abnl pap-LGSIL 2007   Family History:    Reviewed history from 01/25/2007 and no changes required:       obesity, tobacco use, sister has seizure d/o and developmental delay       other sister has Type II DM       child has cleft palate       mom DM II, etoh abuse  Social History:    Reviewed history from 01/25/2007 and no changes required:       G1P1 (2008, girl)  Poor school performance.  Has BF who has graduated from [employer], works at [employer].  Monogamous  relationship, began sexual activity 2004.  H/o tobacco use, none currently.  No EtOH, recreational drugs.       Impression & Recommendations:  Problem # 1:  NAUSEA (ICD-787.02) Assessment: New Patients pregnancy test positive. Discussed tihs with patient who is upset but wants to keep baby. Patient to stop Yaz, not on any other meds.  Orders: U Preg-[facility] ([id number]) [facility]- Est Level  3 (16109)   Problem # 2:  MIGRAINE, CLASSICAL W/O INTRACTABLE MIGRAINE (ICD-346.00) Assessment: Unchanged Recommended Tylenol since patient pregnant(positive test today) The following medications were removed from the medication list:    Voltaren 75 Mg Tbec (Diclofenac sodium) ..... One tab by mouth two times a day prn    Ibuprofen 800 Mg Tabs (Ibuprofen) ..... One every 6 hrs if needed for headache. take with food  Orders: [facility]- Est Level  3 (60454)    Patient Instructions: 1)  please schedule new OB appt 2)  do not take the ibuprofen which was sent to the pharmacy    Prescriptions: IBUPROFEN 800 MG TABS (IBUPROFEN) one every 6  hrs if needed for headache. Take with food  #30 x 1   Entered and Authorized by:   Altamese Cabal MD   Signed by:   Altamese Cabal MD on 05/01/2007   Method used:   Electronically sent to ...       [facility] # [id number]*       [address]       [place], [place]  [address]       Ph: [phone] or [phone]       Fax: [phone]   RxID:   [id number]  ] Laboratory Results   Urine Tests  Date/Time Received: May 01, 2007 4:26 PM   Date/Time Reported: 4:33 PM     Urine HCG: positive Comments: ...............test performed by......Marland KitchenBonnie A. [place], [place] (ASCP)

## 2010-08-16 NOTE — Miscellaneous (Signed)
  Clinical Lists Changes  Problems: Changed problem from PAP SMEAR, LGSIL, ABNORMAL (ICD-795.09) to PAP SMEAR, LGSIL, ABNORMAL (ICD-795.09) - patient  had ASCUS-H on f/u pap 6 months after colpo (which showed CIN 1) Since she is currently pregnant and 16, will repeat pap 3 months postpartum, no colpo now. Observations: Added new observation of PAST MED HX:  mild zoster in March 2007 SVD 1/08 depressive disorder NOS abnl pap-LGSIL 2007, s/p colpo-->CIN I ASCUS-H pap 10/08 (patient pregnant) needs f/u pap 3 months postpartum (05/26/2007 18:06)       Past Medical History:     mild zoster in March 2007    SVD 1/08    depressive disorder NOS    abnl pap-LGSIL 2007, s/p colpo-->CIN I    ASCUS-H pap 10/08 (patient pregnant) needs f/u pap 3 months postpartum

## 2010-08-16 NOTE — Assessment & Plan Note (Signed)
Summary: OV/DNKA          

## 2010-08-16 NOTE — Assessment & Plan Note (Signed)
Summary: FU COLPO/KH   Vital Signs:  Patient Profile:   20 Years Old Female Height:     62.5 inches (158.75 cm) Weight:      158 pounds Temp:     98.3 degrees F Pulse rate:   99 / minute BP sitting:   124 / 74  Pt. in pain?   no  Vitals Entered By: Jone Baseman CMA (Nov 27, 2006 3:57 PM)                PCP:  Altamese Cabal MD  Chief Complaint:  F/U colpo.  History of Present Illness: S: Patient is a 20 y/o female G1P1 here today to discuss colposcopy results.  She had LGSIL on pap smear. Official colpo path report not back in chart yet. Patient denies vaginal bleeding, dischrge, oror and pain. Mook is ok.  She tried Zyprexa 2.5 mg and then slept for two days straight, so she has stopped taking it.        Physical Exam  General:      Well appearing adolescent,no acute distress Psychiatric:      alert and cooperative     Impression & Recommendations:  Problem # 1:  PAP SMEAR, LGSIL, ABNORMAL (ICD-795.09) Assessment: Unchanged Official path report not yet back.  Told patient she will need a f/u pap smear in 6 months. Orders: [facility]- Est Level  3 (16109)   Other Orders: State- HPV Vaccine/ 3 dose sch IM (60454U) Admin 1st Vaccine (98119)   Patient Instructions: 1)  you will need a pap smear in October 2)  please see me in 2-3 months   HPV # 2    Vaccine Type: Gardasil (State)    Site: left deltoid    Mfr: Merck    Dose: 0.5 ml    Route: IM    Given by: JESSICA FLEEGER CMA    Exp. Date: 03/04/2009    Lot #: [id number]    VIS given: 08/18/05 version given Nov 27, 2006.

## 2010-08-16 NOTE — Progress Notes (Signed)
Summary: [facility]  Phone Note Call from Patient   Caller: Patient Call For: Spero Geralds, Psy.D. Summary of Call: Patient called to report she took one Zyprexa on May 7th at night time.  She said she slept all night and the entire next day.  She hasn't taken any since.  She wants to know what to do.  I consulted Dr. Kathrynn Running who followed up with her by phone.  Dr. Kathrynn Running said that because she was on a 2.5 mg dose of Zyprexa (the lowest dose possible), we will need to switch medications.  We will follow up with her in [facility] on the 28th to determine the next course of action. Initial call taken by: Spero Geralds PsyD,  Nov 28, 2006 12:14 PM

## 2010-08-16 NOTE — Miscellaneous (Signed)
  Clinical Lists Changes  Observations: Added new observation of PAP DUE: 02/2008 (05/26/2007 18:09) Added new observation of PAP SMEAR: ASCUS (05/16/2007 18:11)       Preventive Care Screening  Pap Smear:    Date:  05/16/2007    Next Due:  02/2008    Results:  ASCUS     patient had ASCUS - H.  Given age and current pregnancy, will repeat 3 months postpartum. 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

## 2010-08-16 NOTE — Assessment & Plan Note (Signed)
Summary: [facility]    Visit Type:  [facility] PCP:  Altamese Cabal MD   History of Present Illness: In the last month, reports a period of four days of "up" mood.  Did some major cleaning between 10 and 1 a.m. with decreased sleep.  Reported feeling hyper and joyful.  She had a week of normal mood and then a three day period of depressed mood that shifted toward a more normal mood when she awoke this morning.  The depressed mood included a desire to withdraw and sleep.  She was tearful for no reason.  She denied suicidal ideation during this time.  She is day four of a new birth control pack.  She sees herself as increased irritability with her period.    Reports throbbing headache at left upper temple region.  She gets them about every other day.  She feels nauseous but does not vomit.  Reports an aura about 30 minutes to an hour before migraine.  + phono/photo phobia. Takes two advil to treat. Has not discussed this with her PCP. She forgets this usually during a visit.          Impression & Recommendations:  Problem # 1:  DISORDER, BIPOLAR NOS (ICD-296.80) Assessment: Unchanged Patient reports mood is "normal" or good - shifting off a period of three days of depressed mood.  Affect: a little flat. No FOI or LOA. No psychotic symptoms. Negative suicide/homocide. Insight:consistent with age Judgment: intact  Discussed possibilities given lack of tolerance to Zyprexa.  Abilify and Lithium were considered.  Depakote would have the potential to help with headaches and provide mood stabilization.  Concerns about Depakote with pregnancy were discussed.  Patient takes Dianah Field and reports no difficulty remembering to take a daily pill.   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  2)  Take 250 mg of Depakote once a day at night with food for three days.  If that pill doesn't bother you after three days then you go to two pills (500 mg).  If you can tolerate that, increase to three pills (750 mg).  Samples provided.  Prescription given for Depakote 250 ER #90.  Take three daily with food.   3)  Take with food.  Most common side effect is stomach related. 4)  Some people can get sedated (sleepy).  Hair loss is uncommon but should be reported if it occurs.  Difficulty focusing or thinking may also be an issue.  Please pay attention to appetite as well.

## 2010-08-16 NOTE — Assessment & Plan Note (Signed)
Summary: check iud wp   Vital Signs:  Patient Profile:   20 Years Old Female Height:     62.5 inches (158.75 cm) Weight:      172 pounds Temp:     98.3 degrees F Pulse rate:   112 / minute BP sitting:   121 / 77  Pt. in pain?   no  Vitals Entered By: Jone Baseman CMA (April 15, 2008 2:43 PM)                   PCP:  Helane Rima MD  Chief Complaint:  IUD check.  History of Present Illness: Michelle [name] is a 20 year old G2P2 that had an IUD placed 03/03/08 by Dr. Jennette Kettle for contraception. She is following up today to check that it is in place. She has checked it herself over the past month and states that she was able to feel the string until recently.     Updated Prior Medication List: IBUPROFEN 400 MG  TABS (IBUPROFEN) one by mouth 3-4 times per day as needed for back pain    Past Medical History:    Reviewed history from 03/03/2008 and no changes required:       Mild Zoster in March 2007       NSVD x 2 1/08 and 6/09       Depressive Disorder NOS       Abnl pap-LGSIL 2007, s/p colpo-->CIN I       ASCUS-H pap 10/08 (patient pregnant)        ASCUS-HPV high risk 02/07/08  Past Surgical History:    Reviewed history from 05/03/2007 and no changes required:       None    Risk Factors:     Counseled to quit/cut down tobacco use:  yes  PAP Smear History:     Date of Last PAP Smear:  05/16/2007       Impression & Recommendations:  Problem # 1:  CONTRACEPTIVE MANAGEMENT (ICD-V25.09) Assessment: Deteriorated Unable to find string of IUD today. Will schedule ultrasound to evaluate. U Preg negative. Advised to use condoms for birth control until next visit. Paient voiced understanding.  Orders: U Preg-[facility] (81025) U Preg-[facility] (81025) Ultrasound (Ultrasound) [facility]- Est Level  3 (16109)   Problem # 2:  ABNORMAL PAP SMEAR (ICD-795.0) Assessment: Deteriorated Pap ASCUS- HPV +, pt is 17, will repeat pap in one year.  Problem # 3:  SMOKER (ICD-305.1) Assessment:  Unchanged Educated risks of continued smoking and benefits of cessation. Will provide education materials and resources. Orders: [facility]- Est Level  3 ([id number])   Complete Medication List: 1)  Ibuprofen 400 Mg Tabs (Ibuprofen) .... One by mouth 3-4 times per day as needed for back pain   Patient Instructions: 1)  Please schedule a follow-up appointment in 1 year. 2)  Please schedule a follow-up appointment as needed. 3)  Tobacco is very bad for your health and your loved ones! You Should stop smoking!. 4)  Stop Smoking Tips: Choose a Quit date. Cut down before the Quit date. Decide what you will do as a substitute when you feel the urge to smoke (gum, toothpick, exercise).   ] Laboratory Results   Urine Tests  Date/Time Received: April 15, 2008 3:48 PM  Date/Time Reported: April 15, 2008 3:55 PM     Urine HCG: negative Comments: ...............test performed by......Marland KitchenBonnie A. [place], [place] (ASCP)

## 2010-08-16 NOTE — Progress Notes (Signed)
Summary: Requesting to speak with Dr. Iven Finn   Phone Note Call from Patient Call back at Home Phone [phone]   Reason for Call: Talk to Doctor Summary of Call: Pt is requesting to speak with Dr. Iven Finn.  Pt is in denial about her preg test coming back positive and needs reassurance. Initial call taken by: Haydee Salter,  May 02, 2007 2:25 PM  Follow-up for Phone Call        pt calling again, requesting we page Dr. Iven Finn. Done by Amion. Follow-up by: ERIN LEVAN,  May 02, 2007 3:33 PM  Additional Follow-up for Phone Call Additional follow up Details #1::        spoke with patient and she is having menstrual-like cramps.  No vomiting or fevers.  No vaginal bleeding. Pain 4/10 in severity. Told patient if it gets worse to go to [facility] for evaluation. They could r/o ectopic.  She also has appt to see me tomorrow Additional Follow-up by: Altamese Cabal MD,  May 02, 2007 4:13 PM

## 2010-08-16 NOTE — Assessment & Plan Note (Signed)
Summary: sore throat & has lost voice/[place]/wallace   Vital Signs:  Patient profile:   20 year old female Weight:      131.9 pounds BMI:     23.83 Temp:     98.3 degrees F Pulse rate:   88 / minute BP sitting:   110 / 76  (right arm)  Vitals Entered By: Arlyss Repress CMA, (April 16, 2009 3:04 PM) CC: nasal congestion and cough x 2 days. lost voice yesterday. denies sore throat or pain. Is Patient Diabetic? No Pain Assessment Patient in pain? no        Primary Care Provider:  Helane Rima MD  CC:  nasal congestion and cough x 2 days. lost voice yesterday. denies sore throat or pain.Marland Kitchen  History of Present Illness: 20 yo female here for 2 day h/o nasal congestion and cough without otalgia, pharyngitis, facial pain, fever, or shortness of breath.  Has not tried any OTC meds.  Is now losing voice.  Missed work yesterday.  Habits & Providers  Alcohol-Tobacco-Diet     Tobacco Status: current     Tobacco Counseling: to quit use of tobacco products  Current Medications (verified): 1)  Tessalon Perles 100 Mg Caps (Benzonatate) .Marland Kitchen.. 1 Cap By Mouth Three Times A Day As Needed For Cough  Allergies (verified): 1)  ! Codeine  Physical Exam  Additional Exam:  VITALS:  Reviewed, normal GEN: Alert & oriented, weak-appearing, nonacute NECK: Midline trachea, no masses/thyromegaly LYMPH: No cervical/axillary lymphadenopathy CARDIO: Regular rate and rhythm, no murmurs/rubs/gallops, 2+ bilateral radial pulses, no carotid bruits RESP: Clear to auscultation, normal work of breathing, no retractions/accessory muscle use SKIN: Intact, no lesions     Impression & Recommendations:  Problem # 1:  COUGH (ICD-786.2) Assessment New Suspect allergies, but possible URI.  Sympomatic treatment.  Note for work. Orders: [facility]- Est Level  3 (16109)  Problem # 2:  SMOKER (ICD-305.1) Assessment: Unchanged Not ready to quit. Orders: [facility]- Est Level  3 ([id number])  Complete Medication List: 1)   Tessalon Perles 100 Mg Caps (Benzonatate) .Marland Kitchen.. 1 cap by mouth three times a day as needed for cough  Patient Instructions: 1)  You either have a very mild cold or seasonal allergies.  If it's a cold it should get better over the next 3-5 days.  In the meantime, get plenty of rest, drink plenty of fluids, and take Tessalon Perls for cough. 2)  If you are not better in 3-5 days come back to see Dr. Earlene Plater to consider allergies as the cause. 3)  One of the most important things you can do for your health is to STOP SMOKING.  When you are ready to quit smoking, please call the "quit line" at 1-800-QUIT-NOW (913-524-2113).  You can get more information about the quit line at [email or web address].  It is free! 4)  Please schedule a follow-up appointment as needed with Dr. Earlene Plater. Prescriptions: TESSALON PERLES 100 MG CAPS (BENZONATATE) 1 cap by mouth three times a day as needed for cough  #12 x 0   Entered and Authorized by:   Romero Belling MD   Signed by:   Romero Belling MD on 04/16/2009   Method used:   Print then Give to Patient   RxID:   [id number]

## 2010-08-16 NOTE — Progress Notes (Signed)
Summary: requesting referral  Phone Note Call from Patient Call back at Home Phone [phone]   Reason for Call: Talk to Nurse Summary of Call: pt is requesting to speak with RN, sts she has an ear infection and wants to be referred to a specialist, pt has been there before.  Initial call taken by: Knox Royalty,  Nov 15, 2007 9:07 AM  Follow-up for Phone Call        left message. when she calls back she will need appt here today. md will then decide if referral is to be made Follow-up by: Golden Circle RN,  Nov 15, 2007 9:23 AM  Additional Follow-up for Phone Call Additional follow up Details #1::        returning call Additional Follow-up by: Haydee Salter,  Nov 15, 2007 9:37 AM    Additional Follow-up for Phone Call Additional follow up Details #2::    c/o r ear pain x 5 days. Went to ED & was told she had a perforated ear drum. Was told to see her ear dr. Dr. Jac Canavan. on amoxicillin. discussed with Passenger transport manager. referral ok if hx of referrals there in chart. None found. checked with the doctor she named. she has not been there since 1997. called pt back after speaking to preceptors again. does not need  appt to be seen for this now. can wait for next OB check unless she has increasing pain. states the pain has not improved since Wed when she went to ED. appt sch for 11am today. pcp not available Follow-up by: Golden Circle RN,  Nov 15, 2007 9:39 AM

## 2010-08-16 NOTE — Assessment & Plan Note (Signed)
Summary: 6wk postpartum wp   Vital Signs:  Patient Profile:   20 Years Old Female Height:     62.5 inches (158.75 cm) Weight:      174.5 pounds BMI:     31.52 Temp:     97.4 degrees F Pulse rate:   110 / minute BP sitting:   120 / 73  (right arm)  Pt. in pain?   no  Vitals Entered By: Starleen Blue RN (February 07, 2008 11:15 AM)                  PCP:  Myrtie Soman  MD   History of Present Illness: Pt is a 20 yo female here for 6 week postpartum check.  1. Post-partum check Doing well. No problems with bleeding. Back pain is stable. Taking ibuprofen 400 mg 3-4 times per day. Eating and drinkning normally. Staying acive. Normal bowel and bladder habits.  2. Contraception Pt would like IUD. Is willing to do OCPs until IUD can be inserted. Thinks she'll be able to take a once daily pill. Has not had intercourse since delivery.  3. History of abnormal pap. ASCUS on 10/08. Ok to repeat pap smear today. No vaginal complaints. (no discharge, no bleeding; no dysuria).  4. Tobacco Smoking 2 cigs/day. Mother also smokes about 4 cigs/day. Wants to quit and has been actively cutting down. Ok to continue discussing this matter. Not yet ready to quit.    Past Medical History:     mild zoster in March 2007    SVD x 2 1/08 and 6/09    depressive disorder NOS    abnl pap-LGSIL 2007, s/p colpo-->CIN I    ASCUS-H pap 10/08 (patient pregnant) needs f/u pap postpartum   Social History:    [employer] (2008, girl)  Poor school performance.  Has a finace. Lives with fiance and mom. Dropped out after 9th grade. Hoping to finish school eventually. Monogamous relationship, began sexual activity 2004.  H/o tobacco use, none currently.  No EtOH, recreational drugs.     Physical Exam  General:     alert and overweight-appearing.   Lungs:     Normal respiratory effort, chest expands symmetrically. Lungs are clear to auscultation, no crackles or wheezes. Heart:     normal rate, regular  rhythm, no murmur, no gallop, and no rub.   Abdomen:     soft, non-tender, normal bowel sounds, and no guarding.   Pulses:     2+ DP pulses Extremities:     trace bilateral LE edema; no cyanosis    Impression & Recommendations:  Problem # 1:  POSTPARTUM CARE&EXAMINATION IMMED AFTER DELIV (ICD-V24.0) Assessment: Unchanged Post-partum. s/p SVD. No complications. Pain and bleeding controlled. Has not had intercourse since delivery. Has chronic back pain with pain that is at baseline. Takes ibuprofen 400 mg 3-4 times per day. Continue to follow these issues. Advised pt that as long as pain is under control I am not inclined to augment regimen further as this could cause problems with dependence later on in life. Pt is accepting. [id number]   Problem # 2:  Hx of GESTATIONAL DIABETES (ICD-648.80) Assessment: Unchanged Need to schedule 2hr GTT. Will have staff call pt to do this. Pt is overweight and has inreased risk of developing diabetes. Took glyburide and metformin during pregnancy. No complications. Orders: Postpartum visit- [facility] ([id number])   Problem # 3:  SCREENING FOR MALIGNANT NEOPLASM, CERVIX (ICD-V76.2) Assessment: Unchanged Pap smear performed today in  advance of IUD placement. Will follow-up results. ASCUS 10/08.  Orders: [facility] ([id number]) [facility]   Problem # 4:  CONTRACEPTIVE MANAGEMENT (ICD-V25.09) Assessment: Unchanged Would like IUD. Will schedule in about 1 month. By then pap results will be back. Advised pt not to smoke while on OCPs. Pt smoking 2 cigs per day. Follow-up adherence to daily pill-taking.  Problem # 5:  SMOKER (ICD-305.1) Assessment: Unchanged Smoking 2 cigs/day. Trying to stop entirely. Follow-up on this. Relative contraindication with OCPs. Will discuss this further if pt plans to be on OCPs long-term. [id number]   Complete Medication List: 1)  Ortho Tri-cyclen  (28) 0.035 Mg Tabs (Norgestimate-ethinyl estradiol) .... One by mouth daily; dispense one month's supply 2)  Ibuprofen 400 Mg Tabs (Ibuprofen) .... One by mouth 3-4 times per day as needed for back pain   Patient Instructions: 1)  Take the birth control pills every day. If you get off track use another form of contraception (like condoms). You shouldn't smoke while you are taking birth control pills. 2)  Increase ibuprofen to 600 mg up to four times a day for back pain. Tylenol will also help. 3)  We'll schedule you for an IUD insertion in our colposcopy clinic. Please schedule in one month. 4)  We'll get back to you on the results of your pap smear.   Prescriptions: ORTHO TRI-CYCLEN (28) 0.035 MG  TABS (NORGESTIMATE-ETHINYL ESTRADIOL) one by mouth daily; dispense one month's supply  #1 x 3   Entered and Authorized by:   Myrtie Soman  MD   Signed by:   Myrtie Soman  MD on 02/07/2008   Method used:   Electronically sent to ...       [facility] [address] [phone]*       [address]       [place], [place]  [address]       Ph: [phone] or [phone]       Fax: [phone]   RxID:   [id number]  ]  Vital Signs:  Patient Profile:   20 Years Old Female Height:     62.5 inches (158.75 cm) Weight:      174.5 pounds BMI:     31.52 Temp:     97.4 degrees F Pulse rate:   110 / minute BP sitting:   120 / 73

## 2010-08-16 NOTE — Assessment & Plan Note (Signed)
Summary: POST PARTUM CHECK [facility]   Vital Signs:  Patient Profile:   20 Years Old Female Weight:      158 pounds Temp:     98.3 degrees F Pulse rate:   74 / minute BP sitting:   123 / 73  Pt. in pain?   no  Vitals Entered By: Jone Baseman CMA (September 24, 2006 10:25 AM)                Chief Complaint:  Post Partum visit.  History of Present Illness:       The patient comes in today for a Post Partum visit.  She complains of abnormal vaginal bleeding, but denies fever, chills, abdominal pain, and pelvic pain.  Janaysha states she has had spotting or light flow nearly every day since delivery.The patient is currently bottle feeding exclusively.  She has been having problems sleeping and takes Ambien occasionally  Her mood/affect is described as mildly depressed and she denies suicidal ideation.  She is tolerating Zoloft well which was start about 11 days ago.  Review of her support sytems reveals: single mom, father very helpful, and family very helpful.  Discussed post partum contraception and patient is currently using : Depo Provera.  We discussed switching to a different contraceptive and she is oen to the idea.        Past Medical History:     mild zoster in March 2007    SVD 1/08    depressive disorder NOS    Risk Factors:  Tobacco use:  never    Physical Exam  General:      Well appearing adolescent,no acute distress Lungs:      Clear to ausc, no crackles, rhonchi or wheezing, no grunting, flaring or retractions  Heart:      RRR without murmur  Genitalia:      normal female.  small amnt blood flow from cerivcal os. physiologic leukorrhea.  No adenexal abnormalities. uterus involuted Skin:      Moderate papulo-pustular acne Psychiatric:      mildly depressed affect.      Impression & Recommendations:  Problem # 1:  POSTPARTUM EXAMINATION (ICD-V24.2) Pt recovering well. Teen mom, though good support.  Treating for depression.   Orders: Hemoglobin-[facility]  ([id number]) Postpartum visit- [facility] ([id number])   Problem # 2:  ACNE (ICD-706.1) Assessment: Unchanged Prescribed Benzaclin two times a day.  Problem # 3:  DISORDER, DEPRESSIVE NEC (ICD-311) Assessment: Unchanged Stable. Continue Zoloft 20 mg by mouth qam.  This was started less than two weeks ago so needs time for full effect. Will see patient back in one month and titrate up as needed.  Pt has information on counseling resources.  Problem # 4:  SCREENING FOR MALIGNANT NEOPLASM, CERVIX (ICD-V76.2) Patient due for pap smear.  Also will have first Gardisil today. Orders: Pap Smear-[facility] ([id number])   Other Orders: State- HPV Vaccine/ 3 dose sch IM ([id number]) Admin 1st Vaccine [facility]) [phone])   Patient Instructions: 1)  Please schedule f/u for 1 month from now   HPV # 1    Vaccine Type: Gardasil (State)    Site: left deltoid    Mfr: Merck    Dose: 0.5 ml    Route: IM    Given by: Jone Baseman CMA    Exp. Date: 02/15/2009    Lot #: [id number]    VIS given: 08/18/05 version given September 24, 2006.   Laboratory Results   Blood Tests   Date/Time Recieved: September 24, 2006 11:13 AM  Date/Time Reported: September 24, 2006 11:20 AM    CBC HGB:  13.9 g/dL   (Normal Range: 54.0-98.1 in Males, 12.0-15.0 in Females) Comments: capillary stick

## 2010-08-16 NOTE — Letter (Signed)
Summary: New OB  New OB   Imported By: Denny Peon LEVAN 05/17/2007 09:44:10  _____________________________________________________________________  External Attachment:    Type:   Image     Comment:   External Document

## 2010-08-16 NOTE — Assessment & Plan Note (Signed)
Summary: check iud/flu shot//ts   Vital Signs:  Patient Profile:   20 Years Old Female Height:     62.5 inches (158.75 cm) Weight:      172.3 pounds Pulse rate:   100 / minute BP sitting:   119 / 75  (right arm)  Pt. in pain?   no  Vitals Entered By: Arlyss Repress CMA, (April 21, 2008 2:56 PM)                 Last Flu Vaccine:  Fluvax 3+ (05/03/2007 1:36:54 PM) Flu Vaccine Result Date:  04/21/2008 Flu Vaccine Result:  given Flu Vaccine Next Due:  1 yr   PCP:  Helane Rima MD  Chief Complaint:  check iud and flu shot.  History of Present Illness: Michelle [name] is a 20 year old that I saw last week for an IUD check. At that time, I did not locate her IUD string and sent her for an [place]. She went to the [facility] for her [place], but was enable to go into the building since she had her child with her. She is here today with her daughter, Michelle [name], for a child visit. During the visit, she mentioned that 1) she was unable to get the [place], and 2) she thinks that she has been able to feel the string since last week.   1. Will check for IUD string. 2. Will give flu shot.    Past Medical History:    Reviewed history from 04/15/2008 and no changes required:       Mild Zoster in March 2007       NSVD x 2 1/08 and 6/09       Depressive Disorder NOS       Abnl pap-LGSIL 2007, s/p colpo-->CIN I       ASCUS-H pap 10/08 (patient pregnant)        ASCUS-HPV high risk 02/07/08  Past Surgical History:    Reviewed history from 04/15/2008 and no changes required:       None   Family History:    Reviewed history from 05/15/2007 and no changes required:       obesity, tobacco use, sister has seizure d/o and developmental delay       other sister has Type II DM       child has cleft palate       mom DM II, etoh abuse       great GF and GM - CAD, CHF  Social History:    Reviewed history from 02/07/2008 and no changes required:       G221 (2008, girl)  Poor school performance.  Has a finace. Lives with  fiance and mom. Dropped out after 9th grade, but is going to go back for her GED soon. Hoping to finish school eventually. Monogamous relationship, began sexual activity 2004.  + Tobacco.  No ETOH or recreational drugs.    Review of Systems  The patient denies fever, chest pain, syncope, dyspnea on exertion, peripheral edema, prolonged cough, and abdominal pain.     Physical Exam  Lungs:     Normal respiratory effort, chest expands symmetrically. Lungs are clear to auscultation, no crackles or wheezes. Heart:     Normal rate and regular rhythm. S1 and S2 normal without gallop, murmur, click, rub or other extra sounds. Genitalia:     normal introitus, no external lesions, no vaginal discharge, and mucosa pink and moist.  No IUD string.    Impression & Recommendations:  Problem # 1:  CONTRACEPTIVE MANAGEMENT (ICD-V25.09) Assessment: Unchanged No IUD located. Continue with plan: Ultrasound. Condoms for contraception.  Other Orders: Influenza Vaccine NON MCR ([address])    ]  Influenza Vaccine    Vaccine Type: Fluvax Non-MCR    Site: left deltoid    Mfr: GlaxoSmithKline    Dose: 0.5 ml    Route: IM    Given by: Arlyss Repress CMA,    Exp. Date: 01/13/2009    Lot #: [id number]    VIS given: 02/07/07 version given April 21, 2008.  Flu Vaccine Consent Questions    Do you have a history of severe allergic reactions to this vaccine? no    Any prior history of allergic reactions to egg and/or gelatin? no    Do you have a sensitivity to the preservative Thimersol? no    Do you have a past history of Guillan-Barre Syndrome? no    Do you currently have an acute febrile illness? no    Have you ever had a severe reaction to latex? no    Vaccine information given and explained to patient? yes    Are you currently pregnant? no   Appended Document: check iud/flu shot//ts                ]

## 2010-08-16 NOTE — Assessment & Plan Note (Signed)
Summary: abd cramping/ls   Vital Signs:  Patient Profile:   20 Years Old Female Height:     62.5 inches (158.75 cm) Weight:      171.5 pounds Temp:     97.6 degrees F Pulse rate:   109 / minute BP sitting:   140 / 69  (right arm)  Pt. in pain?   yes    Location:   abd.    Intensity:   4  Vitals Entered By: Starleen Blue RN (May 03, 2007 1:46 PM)                  PCP:  Altamese Cabal MD  Chief Complaint:  abd. cramping.  History of Present Illness: S: 20 y/o female G2P! recently found out she was pregnant. Initially very upset but wanted to keep baby. Her fiance is very supportive and happy. The patient had abdominal cramping yesterday, went to [facility].  U/s showed very early IUP. Hcg was about 1,100.  LLQ cramping is a lot better today. No bleeding. No dizziness and no fevers. patient taking PNV.   Lipid Management History:      Negative NCEP/ATP III risk factors include female age less than 48 years old and non-tobacco-user status.       Past Medical History:    Reviewed history from 05/01/2007 and no changes required:        mild zoster in March 2007       SVD 1/08       depressive disorder NOS       abnl pap-LGSIL 2007, s/p colpo,  needs repeat  pap 11/08  Past Surgical History:    Reviewed history from 09/13/2006 and no changes required:       none   Family History:    Reviewed history from 01/25/2007 and no changes required:       obesity, tobacco use, sister has seizure d/o and developmental delay       other sister has Type II DM       child has cleft palate       mom DM II, etoh abuse  Social History:    Reviewed history from 01/25/2007 and no changes required:       G2P1 (2008, girl)  Poor school performance.  Has BF , works at [employer].  Monogamous relationship, began sexual activity 2004.  H/o tobacco use, none currently.  No EtOH, recreational drugs.     Physical Exam  General:     Well-developed,well-nourished,in no acute distress;  alert,appropriate and cooperative throughout examination Abdomen:     Bowel sounds positive,abdomen soft and non-tender without masses, organomegaly or hernias noted. Psych:     Cognition and judgment appear intact. Alert and cooperative with normal attention span and concentration. No apparent delusions, illusions, hallucinations    Impression & Recommendations:  Problem # 1:  ABDOMINAL CRAMPS (ICD-789.00) Assessment: Improved Patient seen at [facility] last night and had normal exam and IUP on u/s. Wanted to come in today for follow-up.  Patients pain has resolved and she has not had bleeding.  Recommended she continue to take PNV and make inital OB appt.  Orders: [facility]- Est Level  3 ([address])   Other Orders: Flu Vaccine 14yrs + ([id number]) Admin 1st Vaccine (09811)  Lipid Assessment/Plan:      Based on NCEP/ATP III, the patient's risk factor category is "0-1 risk factors".  From this information, the patient's calculated lipid goals are as follows: Total cholesterol goal is 200; LDL  cholesterol goal is 160; HDL cholesterol goal is 40; Triglyceride goal is 150.     Patient Instructions: 1)  schedule new OB appointment    ]  Influenza Vaccine    Vaccine Type: Fluvax 3+    Site: left deltoid    Mfr: Sanofi Pasteur    Dose: 0.5 ml    Route: IM    Given by: Shanda Bumps fleger cma    Exp. Date: 01/14/2008    Lot #: [id number]    VIS given: 01/13/05 version given May 03, 2007.  Flu Vaccine Consent Questions    Do you have a history of severe allergic reactions to this vaccine? no    Any prior history of allergic reactions to egg and/or gelatin? no    Do you have a sensitivity to the preservative Thimersol? no    Do you have a past history of Guillan-Barre Syndrome? no    Do you currently have an acute febrile illness? no    Have you ever had a severe reaction to latex? no    Vaccine information given and explained to patient? yes    Are you currently pregnant? no

## 2010-08-16 NOTE — Letter (Signed)
Summary: CBC, CMet Results -- wnl  [facility]  [address]   [place], [place] [address]   Phone: [phone]  Fax: [phone]    06/11/2008  Michelle [name] [address] [place], [place]  [address]  Dear [name],  We checked your electrolytes and blood counts in lab yesterday, and I am happy to tell you that your results were all normal.  Sincerely,   Romero Belling MD [facility]  Appended Document: CBC, CMet Results -- wnl sent

## 2010-08-16 NOTE — Progress Notes (Signed)
Summary: Note for dentist  Phone Note Call from Patient Call back at Home Phone [phone]   Summary of Call: Pt needs to speak with someone about getting a note to see the dentist.  She has an appt with them tomorrow and forgot to get a note from [place] because she [facility]. Initial call taken by: Haydee Salter,  September 23, 2007 8:35 AM  Follow-up for Phone Call        Pt states she is returning a call to someone. Follow-up by: Haydee Salter,  September 23, 2007 8:52 AM  Additional Follow-up for Phone Call Additional follow up Details #1::        Pt needed a note since she is a gestational diabetic.  Advsied she should get the note from [facility] since they are following her pregnancy.  Pt agreeable. Additional Follow-up by: [facility] CMA,  September 23, 2007 11:54 AM

## 2010-08-16 NOTE — Miscellaneous (Signed)
Summary: Orders Update - 2hr GTT  Clinical Lists Changes  Orders: Added new Test order of Miscellaneous Lab Charge-[facility] [phone]) - Signed

## 2010-08-16 NOTE — Progress Notes (Signed)
Summary: wi request  Phone Note Call from Patient Call back at [phone]   Reason for Call: Talk to Nurse Summary of Call: wi request, having pregnancy symptoms & concerned bc she still has merena Initial call taken by: Knox Royalty,  February 07, 2010 9:11 AM  Follow-up for Phone Call        told her pregnancy unlikely. appt at 4 with pcp. has to get a ride here  Follow-up by: Golden Circle RN,  February 07, 2010 9:15 AM

## 2010-08-16 NOTE — Progress Notes (Signed)
Summary: Gardisil   Phone Note Call from Patient Call back at Home Phone [phone]   Reason for Call: Talk to Nurse Summary of Call: Patient would like to know when she got her last Gardisil shot. Initial call taken by: Haydee Salter,  February 26, 2007 9:21 AM  Follow-up for Phone Call        Per pt's record first gardasil received 09/24/06. Pt due for the third dose six months from the first. Pt due 03/27/07, pt made aware. Follow-up by: Tomasa Rand,  February 26, 2007 9:36 AM

## 2010-08-16 NOTE — Progress Notes (Signed)
Summary: [place] request   Phone Note Call from Patient Call back at Home Phone [phone]   Summary of Call: Pt states she wants to speak with an rn about something that is "very private" Initial call taken by: Haydee Salter,  March 25, 2007 10:06 AM  Follow-up for Phone Call        pt states she needs appointment to discuss birth control, Inplanan. also has white bumps on areola area of each breast which started 2 weeks ago. she had these after her baby was born when she was producing milk but went away. now have appeared again. appointment scheduled with Dr.Samuhel for 04/17/07.  will also send message to Dr.Samuhel to notify of white bumps and if earlier check is needed. Follow-up by: Theresia Lo RN,  March 25, 2007 11:27 AM
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
## 2010-08-16 NOTE — Progress Notes (Signed)
Summary: Appt and records  Phone Note From Other Clinic Call back at [phone]   Caller: Revonda Standard @ [facility] Summary of Call: Pt has been referred by [place] and has an appt on 12/1.  Needs order for this appt and all prenatal records faxed to [phone]. Initial call taken by: Haydee Salter,  June 12, 2007 3:30 PM  Follow-up for Phone Call        done  Follow-up by: [facility] CMA,  June 12, 2007 4:49 PM
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
## 2010-08-16 NOTE — Assessment & Plan Note (Signed)
Summary: f/u aw   Vital Signs:  Patient Profile:   20 Years Old Female Height:     62.5 inches (158.75 cm) Weight:      159.9 pounds (72.68 kg) BMI:     28.88 Temp:     97.6 degrees F (36.44 degrees C) Pulse rate:   99 / minute BP sitting:   115 / 74  (left arm)  Pt. in pain?   no  Vitals Entered By: Tomasa Rand (November 02, 2006 1:36 PM)                History of Present Illness: CC: f/u depresion, bronchitis S: Patient is a 20 y/o G1P1 presents one week after ER visit to [facility] where she was diagnosed   dx'd with bronchitis.  FInished antiobiotics 2 weeks ago but still has sore throat , can barely eat or drink mostly bothered by cough. Cough is productive at times.Also has nasal congestion. No recent fevers. nasal congestion. Tried Robitussin which didnt help much.  In terms of her depressio.  Prozac has helped some but she still has days where she stays un bed all day and is "miserable".  Still able to take care of child, but fels anger towards baby at times, but states she definitely would never hurt her.  She also has days where she cleans all day and has lots of energy and very elevated mood.  These "good days" cycle rapidly with the bad days.  No suicidal or homicidal ideation.  Patiet also having irregular vaginal bleeding.  Nearly every days has blood flow.  Patient had depo about 2.5 months ago after delivery of infant.  Would like to switch birth control.  Patient has colposcopy at end of April for LGSIL on last pap.  Patient also trying to lose weight.  She is exercising with a friend and trying to make healthy eating chocies.        Physical Exam  General:      Well appearing adolescent,no acute distress Eyes:      PERRL, EOMI  Ears:      TM's pearly gray with cone, canals clear  Nose:      Clear without erythema, edema or exudate  Mouth:      mild OP erythema.  No tonsil Neck:      no lymphadenopathy Lungs:      Clear to ausc, no crackles,  rhonchi or wheezing, no grunting, flaring or retractions  Heart:      RRR without murmur     Impression & Recommendations:  Problem # 1:  DISORDER, DEPRESSIVE NEC (ICD-311) Assessment: Improved Better with prozac, but also seems to be hypomanic now.  Im concerned that she mayhave bipolar d/o. Referal made to mood disorder clinic.  For now continue current meds. Orders: [facility]- Est  Level 4 ([address])   Problem # 2:  CONTRACEPTIVE MANAGEMENT (ICD-V25.09) Assessment: New Will change patient to Yasmin.  She will start Sunday April 20th.  No contraindications.  Paient provided with condoms as well. Orders: [facility]- Est  Level 4 ([address])   Problem # 3:  U R I (ICD-465.9) Assessment: New Post viral cough present.  Gave patient infaled corticosteroid (Asthmanex) sample.   Orders: [facility]- Est  Level 4 (09811)   Other Orders: U Preg-[facility] ([id number])   Patient Instructions: 1)  f/u 1 month 2)  Start birth control pill Sunday

## 2010-08-16 NOTE — Progress Notes (Signed)
Summary: [place] request  Phone Note Call from Patient Call back at Home Phone [phone]   Summary of Call: pt states she needs to be seen today for a high fever Initial call taken by: Haydee Salter,  Dec 05, 2006 11:40 AM  Follow-up for Phone Call        mother was calling for daughter Michelle [name]. see pt chart Follow-up by: Theresia Lo RN,  Dec 05, 2006 11:50 AM

## 2010-08-16 NOTE — Progress Notes (Signed)
Summary: [place] request   Phone Note Call from Patient Call back at Home Phone [phone]   Summary of Call: Pt is requesting ot speak with an rn about abdominal pain, states she is pregnant. Initial call taken by: Haydee Salter,  May 02, 2007 3:51 PM  Follow-up for Phone Call        spoke with patient and she reports abdominal cramoing with a pain score of 4. no bleeding. started this am and has continued. scheduled appointment with pcp tomorrow afternoon and advised if pain worsens to go to [facility] ER.  Also advised will page MD to make sure she doesn't need to be seen sooner.  Dr. Iven Finn notified and states she was just about to call pt anyway and will discuss. will keep appointment for tomorrow afternoon. Follow-up by: Theresia Lo RN,  May 02, 2007 4:12 PM

## 2010-08-18 NOTE — Progress Notes (Signed)
Summary: UPDATE PROBLEM LIST   

## 2010-08-23 ENCOUNTER — Encounter: Payer: Self-pay | Admitting: *Deleted

## 2010-10-28 ENCOUNTER — Encounter: Payer: Self-pay | Admitting: Family Medicine

## 2010-10-28 ENCOUNTER — Ambulatory Visit (INDEPENDENT_AMBULATORY_CARE_PROVIDER_SITE_OTHER): Payer: Self-pay | Admitting: Family Medicine

## 2010-10-28 DIAGNOSIS — N6019 Diffuse cystic mastopathy of unspecified breast: Secondary | ICD-10-CM | POA: Insufficient documentation

## 2010-10-28 DIAGNOSIS — N921 Excessive and frequent menstruation with irregular cycle: Secondary | ICD-10-CM

## 2010-10-28 DIAGNOSIS — N920 Excessive and frequent menstruation with regular cycle: Secondary | ICD-10-CM

## 2010-10-28 MED ORDER — KETOROLAC TROMETHAMINE 30 MG/ML IJ SOLN
30.0000 mg | Freq: Once | INTRAMUSCULAR | Status: AC
  Administered 2010-10-28: 30 mg via INTRAMUSCULAR

## 2010-10-28 MED ORDER — NORGESTIMATE-ETH ESTRADIOL 0.25-35 MG-MCG PO TABS: ORAL_TABLET | ORAL | Status: DC

## 2010-10-28 NOTE — Progress Notes (Signed)
  Subjective:    Patient ID: Michelle [name], female    DOB: 01/13/1991, 20 y.o.   MRN: [id number]  HPI R breast pain- Present x 1-2 weeks. No fevers, chills, nausea, vomiting. No redness, nipple discharge. No breast feeding. Has had bilateral breast pain in the past associated with menstruation. Pain similar in presentation. Currently menstruating per pt.  Also with menorrhagia type sxs per pt. Has had mirena IUD for last 2-3 years per pt. Has had  Intermittent menstrual bleeding for last 1-2 years despite IUD. Had irregular menses prior to IUD per pt. No abd pain, dysuria.     Review of Systems See HPI     Objective:   Physical Exam  Pulmonary/Chest:     Gen: up in chair, NAD R Breast (see diagram) L breast- WNL  ABD: S/NT/+ bowel sounds  EXT: 2+ peripheral pulses    Assessment & Plan:  R Breast- Likely fibrocystic disease with acute flare. No infectious red flags.  Will give pt shot of toradol for pain. Instructed to use prn ibuprofen/mobic for pain. Will start on on monophasic OCP in setting of concurrent menorrhagia.  Pt instructed to follow up with PCP in 2-4 weeks about breast pain and menorrhagia.   Menorrhagia- Apparently longstanding history of menorrhagia per pt prior to IUD. Am starting on monophasic OCP 10/2010, skipping blanks to follow pattern of menstrual cycle q 3 months. Inclined to start this in setting of concurrent fibrocystic disease. May need GYN referral (?essure) if this persists despite OCPs.

## 2010-10-28 NOTE — Patient Instructions (Signed)
Fibrocystic Breast Changes  Fibrocystic breast changes is a non-cancerous (benign) condition that about half of all women have at some time in their life. It is also called benign breast disease and mammary dysplasia. It may also be called fibrocystic breast disease, but it is not really a disease. It is a common condition that occurs when women go through the hormonal changes during their menstrual cycle, between the ages of 20 to 50. Menopausal women do not have this problem, unless they are on hormone therapy. It can affect one or both breasts. This is not a sign that you will later get cancer.  CAUSES  Overgrowth of cells lining the milk ducts, or enlarged lobules in the breast, cause the breast duct to become blocked. The duct then fills up with fluid. This is like a small balloon filled with water. It is called a cyst. Over time, with repeated inflammation there is a tendency to form scar tissue. This scar tissue becomes the fibrous part of fibrocystic disease. The exact cause of this happening is not known, but it may be related to the female hormones, estrogen and progesterone. Heredity (genetics) may also be a factor in some cases.  SYMPTOMS   Tenderness.    Swelling.    Rope-like feeling.    Lumpy breast, one or both sides.    Changes in the size of the breasts, before and after the menstrual period (larger before, smaller after).    Green or dark brown nipple discharge (not blood).   Symptoms are usually worse before periods (menstrual cycle) and get better toward the end of menstruation. Usually, it is temporary minor discomfort. But some women have severe pain.    DIAGNOSIS   Check your breasts monthly. The best time to check your breasts is after your period. If you check them during your period, you are more likely to feel the normal glands enlarged, as a result of the hormonal changes that happen right before your period. If you do not have menstrual periods, check your breasts the first day of every month. Become familiar with the way your own breasts feel. It is then easier to notice if there are changes, such as more tenderness, a new growth, change in breast size, or a change in a lump that has always been there. All breasts lumps need to be investigated, to rule out breast cancer. See your caregiver as soon as possible, if you find a lump. Most breast lumps are not cancerous. Excellent treatment is available for ones that are.    To make a diagnosis, your caregiver will examine your breasts and may recommend other tests, such as:   Mammogram (breast X-ray).    Ultrasound.    MRI (magnetic resonance imaging).    Removing fluid from the cyst with a fine needle, under local anesthesia (aspiration).    Taking a breast tissue sample (breast biopsy).   SOME QUESTIONS YOUR CAREGIVER WILL ASK ARE:   What was the date of your last period?    When did the lump show up?    Is there any discharge from your breast?    Is the breast tender or painful?    Are the symptoms in one or both breasts?    Has the lump changed in size from month-to-month? How long has it been present?    Any family history of breast problems?    Any past breast problems?    Any history of breast surgery?    Are you   taking any medications?    When was your last mammogram, and where was it done?   TREATMENT   Dietary changes help to prevent or reduce the symptoms of fibrocystic breast changes.    You may need to stop consuming all foods that contain caffeine, such as chocolate, sodas, coffee, and tea.    Reducing sugar and fat in your diet may also help.     Decrease estrogen in your diet. Some sources include commercially raised meats which contain estrogen. Eliminate other natural estrogens.    Birth control pills can also make symptoms worse.    Natural progesterone cream, applied at a dose of 15 to 20 milligrams per day, from ovulation until a day or two before your period returns, may help with returning to normal breast tissue over several months. Seek advice from your caregiver.    Over-the-counter pain pills may help, as recommended by your caregiver.    Danazol hormone (female-like hormone) is sometimes used. It may cause hair growth and acne.    Needle aspiration can be used, to remove fluid from the cyst.    Surgery may be needed, to remove a large, persistent, and tender cyst.    Evening primrose oil may help with the tenderness and pain. It has linolenic acid that women may not have enough of.   HOME CARE INSTRUCTIONS   Examine your breasts after every menstrual period.    If you do not have menstrual periods, examine your breasts the first day of every month.    Wear a firm support bra, especially when exercising.    Decrease or avoid caffeine in your diet.    Decrease the fat and sugar in your diet.    Eat a balanced diet.    Try to see your caregiver after you have a menstrual period.    Before seeing your caregiver, make notes about:    When you have the symptoms.    What types of symptoms you are having.    Medications you are taking.    When and where your last mammogram was taken.    Past breast problems or breast surgery.   SEEK MEDICAL CARE IF:   You have been diagnosed with fibrocystic breast changes, and you develop changes in your breast:    Discharge from the nipple, especially bloody discharge.    Pain in the breast that does not go away after your menstrual period.    New lumps or bumps in the breast.    Lumps in your armpit.    Your breast or breasts become enlarged, red, and painful.     You find an isolated lump, even if it is not tender.    You have questions about this condition that have not been answered.   Document Released: 04/19/2006 Document Re-Released: 07/25/2009  [employer] Patient Information 2011 [employer].

## 2010-10-28 NOTE — Assessment & Plan Note (Signed)
Likely fibrocystic disease with acut flare. No infectious red flags.  Will give pt shot of toradol for pain. Instructed to use prn ibuprofen/mobic for pain. Will start on on monophasic OCP in setting of concurrent menorrhagia.  Pt instructed to follow up with PCP in 2-4 weeks about breast pain and menorrhagia.

## 2010-11-29 NOTE — Discharge Summary (Signed)
Michelle [name], Michelle [name]                ACCOUNT NO.:  [id number]   MEDICAL RECORD NO.:  [id number]          PATIENT TYPE:  INP   LOCATION:  9164                          FACILITY:  [facility]   PHYSICIAN:  Norton Blizzard, MD    DATE OF BIRTH:  10/20/1990   DATE OF ADMISSION:  12/03/2007  DATE OF DISCHARGE:  12/04/2007                               DISCHARGE SUMMARY   ADMISSION DIAGNOSES:  1. Intrauterine pregnancy at 35-4/[redacted] weeks gestation.  2. Question preterm premature rupture of membranes.  3. Preterm labor.  4. Class B gestational diabetes.   DISCHARGE DIAGNOSES:  1. Intrauterine pregnancy at 35-5/7 weeks.  2. Preterm labor, resolved.  3. No evidence of preterm premature rupture of membranes.  4. Class B gestational diabetes.   BRIEF HOSPITAL COURSE:  The patient is an 20 year old G2, P1 at 35-4/7  weeks, who presented on Dec 03, 2007, with reports of leaking fluid and  contractions.  Of note, the patient was last seen in the [facility] on Dec 02, 2007.  Her cervical exam at that visit was 2 cm, 75,  and -2.  On examination, on admission, the patient was noted not to have  any pool of fluid but had a small amount of blood-tinged mucus discharge  around her cervix, which was weakly positive for Nitrazine, and had a  few ferns on a slide.  The patient's cervical examination was noted to  be changed from 2 cm dilatation to 4 cm dilatation.  Given her equivocal  testing for preterm premature rupture of membranes, and her cervical  change, the patient was admitted for observation.  Of note, the patient  was afebrile, had no uterine tenderness or any other concerning signs.  Fetal heart rate was in the 140 to 150s and reactive throughout her  stay.  The patient subsequently got an ultrasound that showed an  amniotic fluid index of 15.7.  She had no further leaking of fluid or  further cervical change overnight, and only had irregular contractions  on the tocometer.  After about 12  hours of  observation, it was deemed  that the patient was not in preterm labor and had no evidence of  rupture.  She was deemed stable for discharge to home.  Of note, for her  class B diabetes, her blood sugars were within normal limits with  postprandial of 112 and a fasting of 57 on the day of discharge.   DISCHARGE INSTRUCTIONS:  The patient is to follow up at [facility]  as previously scheduled for her antenatal testing on Dec 05, 2007.  The  patient was also given an antenatal discharge instruction sheet  discussing preterm labor signs or symptoms, and told to call or come  back to the [facility]U for any further concerning symptoms.      Norton Blizzard, MD  Electronically Signed     UAD/MEDQ  D:  12/04/2007  T:  12/04/2007  Job:  [id number]

## 2010-12-05 ENCOUNTER — Emergency Department: Payer: Self-pay | Admitting: Emergency Medicine

## 2011-04-06 LAB — POCT URINALYSIS DIP (DEVICE)
Bilirubin Urine: NEGATIVE
Bilirubin Urine: NEGATIVE
Bilirubin Urine: NEGATIVE
Glucose, UA: NEGATIVE
Glucose, UA: NEGATIVE
Glucose, UA: NEGATIVE
Hgb urine dipstick: NEGATIVE
Hgb urine dipstick: NEGATIVE
Hgb urine dipstick: NEGATIVE
Ketones, ur: NEGATIVE
Ketones, ur: NEGATIVE
Ketones, ur: NEGATIVE
Nitrite: NEGATIVE
Nitrite: NEGATIVE
Nitrite: NEGATIVE
Operator id: 194561
Operator id: 297281
Operator id: 297281
Protein, ur: NEGATIVE
Protein, ur: NEGATIVE
Protein, ur: NEGATIVE
Specific Gravity, Urine: 1.01
Specific Gravity, Urine: 1.02
Specific Gravity, Urine: 1.015
Urobilinogen, UA: 0.2
Urobilinogen, UA: 0.2
Urobilinogen, UA: 0.2
pH: 7
pH: 7
pH: 7.5

## 2011-04-06 LAB — URINALYSIS, ROUTINE W REFLEX MICROSCOPIC
Bilirubin Urine: NEGATIVE
Glucose, UA: NEGATIVE
Hgb urine dipstick: NEGATIVE
Ketones, ur: 15 — AB
Nitrite: NEGATIVE
Protein, ur: NEGATIVE
Specific Gravity, Urine: 1.01
Urobilinogen, UA: 0.2
pH: 7

## 2011-04-07 LAB — POCT URINALYSIS DIP (DEVICE)
Bilirubin Urine: NEGATIVE
Bilirubin Urine: NEGATIVE
Glucose, UA: NEGATIVE
Glucose, UA: NEGATIVE
Hgb urine dipstick: NEGATIVE
Hgb urine dipstick: NEGATIVE
Ketones, ur: NEGATIVE
Ketones, ur: NEGATIVE
Nitrite: NEGATIVE
Nitrite: NEGATIVE
Operator id: 194561
Operator id: 297281
Protein, ur: NEGATIVE
Protein, ur: NEGATIVE
Specific Gravity, Urine: 1.015
Specific Gravity, Urine: 1.015
Urobilinogen, UA: 0.2
Urobilinogen, UA: 0.2
pH: 7.5
pH: 7.5

## 2011-04-10 LAB — POCT URINALYSIS DIP (DEVICE)
Bilirubin Urine: NEGATIVE
Bilirubin Urine: NEGATIVE
Glucose, UA: NEGATIVE
Glucose, UA: NEGATIVE
Hgb urine dipstick: NEGATIVE
Hgb urine dipstick: NEGATIVE
Ketones, ur: NEGATIVE
Ketones, ur: NEGATIVE
Nitrite: NEGATIVE
Nitrite: NEGATIVE
Operator id: 297281
Operator id: 297281
Protein, ur: NEGATIVE
Protein, ur: NEGATIVE
Specific Gravity, Urine: <=1.005
Specific Gravity, Urine: 1.01
Urobilinogen, UA: 0.2
Urobilinogen, UA: 0.2
pH: 6.5
pH: 7.5

## 2011-04-11 LAB — POCT URINALYSIS DIP (DEVICE)
Bilirubin Urine: NEGATIVE
Bilirubin Urine: NEGATIVE
Bilirubin Urine: NEGATIVE
Glucose, UA: NEGATIVE
Glucose, UA: NEGATIVE
Glucose, UA: NEGATIVE
Hgb urine dipstick: NEGATIVE
Hgb urine dipstick: NEGATIVE
Hgb urine dipstick: NEGATIVE
Ketones, ur: NEGATIVE
Ketones, ur: NEGATIVE
Ketones, ur: NEGATIVE
Nitrite: NEGATIVE
Nitrite: NEGATIVE
Nitrite: NEGATIVE
Operator id: 194561
Operator id: 194561
Operator id: 297281
Protein, ur: NEGATIVE
Protein, ur: NEGATIVE
Protein, ur: NEGATIVE
Specific Gravity, Urine: 1.01
Specific Gravity, Urine: 1.01
Specific Gravity, Urine: 1.01
Urobilinogen, UA: 0.2
Urobilinogen, UA: 0.2
Urobilinogen, UA: 0.2
pH: 7.5
pH: 7
pH: 7.5

## 2011-04-11 LAB — WOUND CULTURE: Gram Stain: NONE SEEN

## 2011-04-12 LAB — COMPREHENSIVE METABOLIC PANEL
ALT: 12
AST: 16
Albumin: 2.3 — ABNORMAL LOW
Alkaline Phosphatase: 94
BUN: 6
CO2: 21
Calcium: 8.6
Chloride: 108
Creatinine, Ser: 0.49
GFR calc Af Amer: >60
GFR calc non Af Amer: >60
Glucose, Bld: 128 — ABNORMAL HIGH
Potassium: 3.6
Sodium: 137
Total Bilirubin: 0.3
Total Protein: 5.7 — ABNORMAL LOW

## 2011-04-12 LAB — CBC
HCT: 30.4 — ABNORMAL LOW
Hemoglobin: 10.6 — ABNORMAL LOW
MCHC: 34.8
MCV: 91.2
Platelets: 207
RBC: 3.34 — ABNORMAL LOW
RDW: 13.2
WBC: 14.4 — ABNORMAL HIGH

## 2011-04-12 LAB — RPR: RPR Ser Ql: NONREACTIVE

## 2011-04-13 LAB — CBC
HCT: 30.9 — ABNORMAL LOW
Hemoglobin: 10.8 — ABNORMAL LOW
MCHC: 35
MCV: 91.8
Platelets: 215
RBC: 3.37 — ABNORMAL LOW
RDW: 13.4
WBC: 14.3 — ABNORMAL HIGH

## 2011-04-13 LAB — POCT URINALYSIS DIP (DEVICE)
Bilirubin Urine: NEGATIVE
Bilirubin Urine: NEGATIVE
Glucose, UA: NEGATIVE
Glucose, UA: NEGATIVE
Hgb urine dipstick: NEGATIVE
Hgb urine dipstick: NEGATIVE
Ketones, ur: NEGATIVE
Ketones, ur: NEGATIVE
Nitrite: NEGATIVE
Nitrite: NEGATIVE
Operator id: 194561
Operator id: 297281
Protein, ur: NEGATIVE
Protein, ur: NEGATIVE
Specific Gravity, Urine: 1.01
Specific Gravity, Urine: 1.01
Urobilinogen, UA: 0.2
Urobilinogen, UA: 1
pH: 7
pH: 7

## 2011-04-13 LAB — RPR: RPR Ser Ql: NONREACTIVE

## 2011-04-18 LAB — COMPREHENSIVE METABOLIC PANEL
ALT: 22
AST: 23
Albumin: 4
Alkaline Phosphatase: 105
BUN: 15
CO2: 24
Calcium: 9.5
Chloride: 104
Creatinine, Ser: 0.69
GFR calc Af Amer: >60
GFR calc non Af Amer: >60
Glucose, Bld: 105 — ABNORMAL HIGH
Potassium: 3.5
Sodium: 136
Total Bilirubin: 0.3
Total Protein: 7.4

## 2011-04-18 LAB — DIFFERENTIAL
Basophils Absolute: 0
Basophils Relative: 0
Eosinophils Absolute: 0.1
Eosinophils Relative: 1
Lymphocytes Relative: 26
Lymphs Abs: 3.9
Monocytes Absolute: 1.1 — ABNORMAL HIGH
Monocytes Relative: 7
Neutro Abs: 10.2 — ABNORMAL HIGH
Neutrophils Relative %: 66

## 2011-04-18 LAB — POCT I-STAT, CHEM 8
BUN: 16
Calcium, Ion: 1.2
Chloride: 106
Creatinine, Ser: 0.9
Glucose, Bld: 114 — ABNORMAL HIGH
HCT: 42
Hemoglobin: 14.3
Potassium: 3.8
Sodium: 139
TCO2: 24

## 2011-04-18 LAB — CBC
HCT: 40.7
Hemoglobin: 13.7
MCHC: 33.7
MCV: 86.6
Platelets: 255
RBC: 4.7
RDW: 13.8
WBC: 15.3 — ABNORMAL HIGH

## 2011-04-18 LAB — URINALYSIS, ROUTINE W REFLEX MICROSCOPIC
Bilirubin Urine: NEGATIVE
Glucose, UA: NEGATIVE
Hgb urine dipstick: NEGATIVE
Ketones, ur: NEGATIVE
Nitrite: NEGATIVE
Protein, ur: NEGATIVE
Specific Gravity, Urine: 1.019
Urobilinogen, UA: 0.2
pH: 6.5

## 2011-04-18 LAB — POCT PREGNANCY, URINE: Preg Test, Ur: NEGATIVE

## 2011-04-18 LAB — LIPASE, BLOOD: Lipase: 19

## 2011-04-24 LAB — URINE CULTURE
Colony Count: NO GROWTH
Culture: NO GROWTH

## 2011-04-24 LAB — COMPREHENSIVE METABOLIC PANEL
ALT: 27
AST: 34
Albumin: 3 — ABNORMAL LOW
Alkaline Phosphatase: 75
BUN: 6
CO2: 22
Calcium: 9
Chloride: 104
Creatinine, Ser: 0.51
GFR calc Af Amer: >60
GFR calc non Af Amer: >60
Glucose, Bld: 90
Potassium: 3.7
Sodium: 132 — ABNORMAL LOW
Total Bilirubin: 0.8
Total Protein: 5.9 — ABNORMAL LOW

## 2011-04-24 LAB — DIFFERENTIAL
Basophils Absolute: 0
Basophils Relative: 0
Eosinophils Absolute: 0 — ABNORMAL LOW
Eosinophils Relative: 0
Lymphocytes Relative: 21
Lymphs Abs: 2.7
Monocytes Absolute: 1
Monocytes Relative: 8
Neutro Abs: 9.3 — ABNORMAL HIGH
Neutrophils Relative %: 71

## 2011-04-24 LAB — URINALYSIS, ROUTINE W REFLEX MICROSCOPIC
Bilirubin Urine: NEGATIVE
Bilirubin Urine: NEGATIVE
Glucose, UA: NEGATIVE
Glucose, UA: NEGATIVE
Hgb urine dipstick: NEGATIVE
Hgb urine dipstick: NEGATIVE
Ketones, ur: 15 — AB
Ketones, ur: NEGATIVE
Nitrite: NEGATIVE
Nitrite: NEGATIVE
Protein, ur: NEGATIVE
Protein, ur: NEGATIVE
Specific Gravity, Urine: 1.01
Specific Gravity, Urine: 1.012
Urobilinogen, UA: 0.2
Urobilinogen, UA: 1
pH: 7
pH: 7.5

## 2011-04-24 LAB — CBC
HCT: 34 — ABNORMAL LOW
Hemoglobin: 12
MCHC: 35.3
MCV: 85.4
Platelets: 232
RBC: 3.98
RDW: 13.2
WBC: 13.1 — ABNORMAL HIGH

## 2011-04-26 LAB — URINALYSIS, ROUTINE W REFLEX MICROSCOPIC
Bilirubin Urine: NEGATIVE
Glucose, UA: NEGATIVE
Hgb urine dipstick: NEGATIVE
Ketones, ur: NEGATIVE
Nitrite: NEGATIVE
Protein, ur: NEGATIVE
Specific Gravity, Urine: 1.02
Urobilinogen, UA: 0.2
pH: 6.5

## 2011-04-26 LAB — POCT PREGNANCY, URINE
Operator id: 12087
Preg Test, Ur: POSITIVE

## 2011-04-26 LAB — CBC
HCT: 37.2
Hemoglobin: 12.7
MCHC: 34.2
MCV: 87.8
Platelets: 242
RBC: 4.24
RDW: 13.2
WBC: 8

## 2011-04-26 LAB — WET PREP, GENITAL
Clue Cells Wet Prep HPF POC: NONE SEEN
Trich, Wet Prep: NONE SEEN
Yeast Wet Prep HPF POC: NONE SEEN

## 2011-04-26 LAB — GC/CHLAMYDIA PROBE AMP, GENITAL
Chlamydia, DNA Probe: NEGATIVE
GC Probe Amp, Genital: NEGATIVE

## 2011-04-26 LAB — HCG, QUANTITATIVE, PREGNANCY: hCG, Beta Chain, Quant, S: 1187 — ABNORMAL HIGH

## 2011-05-01 LAB — POCT URINALYSIS DIP (DEVICE)
Bilirubin Urine: NEGATIVE
Glucose, UA: NEGATIVE
Hgb urine dipstick: NEGATIVE
Ketones, ur: NEGATIVE
Nitrite: NEGATIVE
Operator id: 235561
Protein, ur: 30 — AB
Specific Gravity, Urine: 1.015
Urobilinogen, UA: 0.2
pH: 7

## 2011-05-01 LAB — I-STAT 8, (EC8 V) (CONVERTED LAB)
Acid-base deficit: 1
BUN: 10
Bicarbonate: 24.6 — ABNORMAL HIGH
Chloride: 105
Glucose, Bld: 108 — ABNORMAL HIGH
HCT: 39
Hemoglobin: 13.3
Operator id: 235561
Potassium: 3.6
Sodium: 137
TCO2: 26
pCO2, Ven: 42.3 — ABNORMAL LOW
pH, Ven: 7.372 — ABNORMAL HIGH

## 2011-05-01 LAB — POCT PREGNANCY, URINE
Operator id: 235561
Preg Test, Ur: NEGATIVE

## 2011-06-26 ENCOUNTER — Emergency Department: Payer: Self-pay | Admitting: Emergency Medicine

## 2011-08-11 ENCOUNTER — Ambulatory Visit: Payer: Medicaid Other | Admitting: Family Medicine

## 2011-08-11 ENCOUNTER — Ambulatory Visit (INDEPENDENT_AMBULATORY_CARE_PROVIDER_SITE_OTHER): Payer: Medicaid Other | Admitting: Family Medicine

## 2011-08-11 ENCOUNTER — Encounter: Payer: Self-pay | Admitting: Family Medicine

## 2011-08-11 ENCOUNTER — Ambulatory Visit (HOSPITAL_COMMUNITY)
Admission: RE | Admit: 2011-08-11 | Discharge: 2011-08-11 | Disposition: A | Discharge status: Home / Self Care | Payer: Medicaid Other | Source: Ambulatory Visit | Attending: Family Medicine | Admitting: Family Medicine

## 2011-08-11 VITALS — BP 121/80 | HR 108 | Ht 62.5 in | Wt 140.0 lb

## 2011-08-11 DIAGNOSIS — T8332XA Displacement of intrauterine contraceptive device, initial encounter: Secondary | ICD-10-CM

## 2011-08-11 DIAGNOSIS — Z30431 Encounter for routine checking of intrauterine contraceptive device: Secondary | ICD-10-CM | POA: Insufficient documentation

## 2011-08-11 DIAGNOSIS — N920 Excessive and frequent menstruation with regular cycle: Secondary | ICD-10-CM | POA: Insufficient documentation

## 2011-08-11 DIAGNOSIS — IMO0001 Reserved for inherently not codable concepts without codable children: Secondary | ICD-10-CM

## 2011-08-11 DIAGNOSIS — T8389XA Other specified complication of genitourinary prosthetic devices, implants and grafts, initial encounter: Secondary | ICD-10-CM

## 2011-08-11 DIAGNOSIS — Y849 Medical procedure, unspecified as the cause of abnormal reaction of the patient, or of later complication, without mention of misadventure at the time of the procedure: Secondary | ICD-10-CM

## 2011-08-11 NOTE — Progress Notes (Signed)
  Subjective:    Patient ID: Michelle [name], female    DOB: 02-22-1991, 21 y.o.   MRN: [id number]  HPI IUD removal: Pt states that she would like IUD removed due to daily vaginal bleeding for the past 1 1/2 years.  Initially had no period for the first 2 years.  Also would like to start trying for 3rd child with her boyfriend.  No vaginal discharge. No abd pain.  No vaginal bleeding currently. No foul odor.   Review of Systems As per above    Objective:   Physical Exam  Constitutional: She appears well-developed and well-nourished.  Cardiovascular: Normal rate.   Pulmonary/Chest: Effort normal. No respiratory distress.  Genitourinary: Vagina normal and uterus normal. Cervix exhibits no motion tenderness, no discharge and no friability. Right adnexum displays no mass, no tenderness and no fullness. Left adnexum displays no tenderness and no fullness.            Assessment & Plan:

## 2011-08-16 DIAGNOSIS — T8332XA Displacement of intrauterine contraceptive device, initial encounter: Secondary | ICD-10-CM | POA: Insufficient documentation

## 2011-08-16 NOTE — Assessment & Plan Note (Signed)
Unable to visualize strings IUD. Attempted to obtain strings by using cervical Pap smear brush. Unsuccessful. We'll send patient for ultrasound of abdomen to identify IUD position. Will refer to OB/GYN for removal.

## 2011-08-28 ENCOUNTER — Telehealth: Payer: Self-pay | Admitting: *Deleted

## 2011-08-28 NOTE — Telephone Encounter (Signed)
Waiting for call back. Please tell pt:  APPT AT [facility] 09-08-11 AT 10 AM. ARRIVE 15 MIN BEFORE APPT AND BRING PICTURE ID AND INSURANCE CARD LIST OF MEDS AND CO-PAY  .Michelle [name]

## 2011-08-30 NOTE — Telephone Encounter (Signed)
LMOM cell #informing pt of appt as no call back yet.

## 2011-09-05 ENCOUNTER — Encounter (HOSPITAL_COMMUNITY): Payer: Self-pay

## 2011-09-05 ENCOUNTER — Emergency Department (HOSPITAL_COMMUNITY): Payer: Medicaid Other

## 2011-09-05 ENCOUNTER — Emergency Department (HOSPITAL_COMMUNITY)
Admission: EM | Admit: 2011-09-05 | Discharge: 2011-09-05 | Disposition: A | Discharge status: Home Health | Payer: Medicaid Other | Attending: Emergency Medicine | Admitting: Emergency Medicine

## 2011-09-05 DIAGNOSIS — R1031 Right lower quadrant pain: Secondary | ICD-10-CM

## 2011-09-05 DIAGNOSIS — N12 Tubulo-interstitial nephritis, not specified as acute or chronic: Secondary | ICD-10-CM

## 2011-09-05 DIAGNOSIS — R1011 Right upper quadrant pain: Secondary | ICD-10-CM | POA: Insufficient documentation

## 2011-09-05 DIAGNOSIS — D72829 Elevated white blood cell count, unspecified: Secondary | ICD-10-CM

## 2011-09-05 LAB — CBC
HCT: 44.7 % (ref 36.0–46.0)
Hemoglobin: 15.2 g/dL — ABNORMAL HIGH (ref 12.0–15.0)
MCH: 32.1 pg (ref 26.0–34.0)
MCHC: 34 g/dL (ref 30.0–36.0)
MCV: 94.3 fL (ref 78.0–100.0)
Platelets: 162 10*3/uL (ref 150–400)
RBC: 4.74 MIL/uL (ref 3.87–5.11)
RDW: 12.4 % (ref 11.5–15.5)
WBC: 16.9 10*3/uL — ABNORMAL HIGH (ref 4.0–10.5)

## 2011-09-05 LAB — URINALYSIS, ROUTINE W REFLEX MICROSCOPIC
Bilirubin Urine: NEGATIVE
Glucose, UA: NEGATIVE mg/dL
Ketones, ur: NEGATIVE mg/dL
Leukocytes, UA: NEGATIVE
Nitrite: NEGATIVE
Protein, ur: NEGATIVE mg/dL
Specific Gravity, Urine: 1.015 (ref 1.005–1.030)
Urobilinogen, UA: 1 mg/dL (ref 0.0–1.0)
pH: 7 (ref 5.0–8.0)

## 2011-09-05 LAB — DIFFERENTIAL
Basophils Absolute: 0 10*3/uL (ref 0.0–0.1)
Basophils Relative: 0 % (ref 0–1)
Eosinophils Absolute: 0 10*3/uL (ref 0.0–0.7)
Eosinophils Relative: 0 % (ref 0–5)
Lymphocytes Relative: 11 % — ABNORMAL LOW (ref 12–46)
Lymphs Abs: 1.9 10*3/uL (ref 0.7–4.0)
Monocytes Absolute: 2.1 10*3/uL — ABNORMAL HIGH (ref 0.1–1.0)
Monocytes Relative: 13 % — ABNORMAL HIGH (ref 3–12)
Neutro Abs: 12.8 10*3/uL — ABNORMAL HIGH (ref 1.7–7.7)
Neutrophils Relative %: 76 % (ref 43–77)

## 2011-09-05 LAB — COMPREHENSIVE METABOLIC PANEL
ALT: 13 U/L (ref 0–35)
AST: 18 U/L (ref 0–37)
Albumin: 4.2 g/dL (ref 3.5–5.2)
Alkaline Phosphatase: 79 U/L (ref 39–117)
BUN: 11 mg/dL (ref 6–23)
CO2: 20 mEq/L (ref 19–32)
Calcium: 9.8 mg/dL (ref 8.4–10.5)
Chloride: 103 mEq/L (ref 96–112)
Creatinine, Ser: 0.61 mg/dL (ref 0.50–1.10)
GFR calc Af Amer: >90 mL/min (ref >90)
GFR calc non Af Amer: >90 mL/min (ref >90)
Glucose, Bld: 90 mg/dL (ref 70–99)
Potassium: 3.9 mEq/L (ref 3.5–5.1)
Sodium: 137 mEq/L (ref 135–145)
Total Bilirubin: 0.3 mg/dL (ref 0.3–1.2)
Total Protein: 8 g/dL (ref 6.0–8.3)

## 2011-09-05 LAB — URINE MICROSCOPIC-ADD ON

## 2011-09-05 LAB — WET PREP, GENITAL
Trich, Wet Prep: NONE SEEN
Yeast Wet Prep HPF POC: NONE SEEN

## 2011-09-05 LAB — LIPASE, BLOOD: Lipase: 13 U/L (ref 11–59)

## 2011-09-05 LAB — POCT PREGNANCY, URINE: Preg Test, Ur: NEGATIVE

## 2011-09-05 MED ORDER — ONDANSETRON HCL 4 MG/2ML IJ SOLN
4.0000 mg | INTRAMUSCULAR | Status: DC | PRN
  Administered 2011-09-05: 4 mg via INTRAVENOUS
  Filled 2011-09-05: qty 2

## 2011-09-05 MED ORDER — FENTANYL CITRATE 0.05 MG/ML IJ SOLN
50.0000 ug | Freq: Once | INTRAMUSCULAR | Status: AC
  Administered 2011-09-05: 18:00:00 via INTRAVENOUS
  Filled 2011-09-05: qty 2

## 2011-09-05 MED ORDER — ACETAMINOPHEN 325 MG PO TABS
975.0000 mg | ORAL_TABLET | Freq: Once | ORAL | Status: AC
  Administered 2011-09-05: 975 mg via ORAL

## 2011-09-05 MED ORDER — OXYCODONE-ACETAMINOPHEN 5-325 MG PO TABS
2.0000 | ORAL_TABLET | Freq: Once | ORAL | Status: AC
  Administered 2011-09-05: 2 via ORAL
  Filled 2011-09-05: qty 2

## 2011-09-05 MED ORDER — FENTANYL CITRATE 0.05 MG/ML IJ SOLN
INTRAMUSCULAR | Status: AC
  Administered 2011-09-05: 50 ug via INTRAVENOUS
  Filled 2011-09-05: qty 2

## 2011-09-05 MED ORDER — OXYCODONE-ACETAMINOPHEN 5-325 MG PO TABS: 2.0000 | ORAL_TABLET | Freq: Four times a day (QID) | ORAL | Status: DC | PRN

## 2011-09-05 MED ORDER — ACETAMINOPHEN 325 MG PO TABS
ORAL_TABLET | ORAL | Status: AC
  Administered 2011-09-05: 975 mg via ORAL
  Filled 2011-09-05: qty 3

## 2011-09-05 MED ORDER — DEXTROSE 5 % IV SOLN
1.0000 g | Freq: Once | INTRAVENOUS | Status: AC
  Administered 2011-09-05: 1 g via INTRAVENOUS
  Filled 2011-09-05: qty 10

## 2011-09-05 MED ORDER — CEFPODOXIME PROXETIL 100 MG PO TABS: 100.0000 mg | ORAL_TABLET | Freq: Two times a day (BID) | ORAL | Status: DC

## 2011-09-05 MED ORDER — IOHEXOL 300 MG/ML  SOLN
80.0000 mL | Freq: Once | INTRAMUSCULAR | Status: AC | PRN
  Administered 2011-09-05: 80 mL via INTRAVENOUS

## 2011-09-05 MED ORDER — FENTANYL CITRATE 0.05 MG/ML IJ SOLN
50.0000 ug | Freq: Once | INTRAMUSCULAR | Status: AC
  Administered 2011-09-05: 50 ug via INTRAVENOUS

## 2011-09-05 MED ORDER — FENTANYL CITRATE 0.05 MG/ML IJ SOLN
50.0000 ug | Freq: Once | INTRAMUSCULAR | Status: AC
  Administered 2011-09-05: 17:00:00 via INTRAVENOUS
  Filled 2011-09-05: qty 2

## 2011-09-05 MED ORDER — FENTANYL CITRATE 0.05 MG/ML IJ SOLN
INTRAMUSCULAR | Status: AC
  Filled 2011-09-05: qty 2

## 2011-09-05 MED ORDER — FENTANYL CITRATE 0.05 MG/ML IJ SOLN
50.0000 ug | Freq: Once | INTRAMUSCULAR | Status: AC
  Administered 2011-09-05: 15:00:00 via INTRAVENOUS

## 2011-09-05 NOTE — ED Notes (Signed)
Patient transported to Ultrasound 

## 2011-09-05 NOTE — ED Notes (Signed)
Called [place] to estimate time, no answer

## 2011-09-05 NOTE — ED Notes (Signed)
CT notified patient finished drinking contrastr

## 2011-09-05 NOTE — ED Notes (Signed)
Pt was instructed not to take additional Tylenol and not drive or operate heavy machinery while on pain medication. Pt informed that if any of her results are abnormal then she will be informed via phone call.

## 2011-09-05 NOTE — ED Notes (Signed)
Family at bedside. 

## 2011-09-05 NOTE — ED Notes (Signed)
Pt stated that she has been having RLQ abdominal pain since yesterday. Pt states that she also was having nausea and vomiting and fevers as well. Pain is a 7 out of 10. Currently no nausea or vomiting. No respiratory or cardiac distress. Bowel sounds heard and present. Will continue to monitor.

## 2011-09-05 NOTE — ED Provider Notes (Signed)
Results for orders placed during the hospital encounter of 09/05/11  URINALYSIS, ROUTINE W REFLEX MICROSCOPIC      Component Value Range   Color, Urine YELLOW  YELLOW    APPearance CLEAR  CLEAR    Specific Gravity, Urine 1.015  1.005 - 1.030    pH 7.0  5.0 - 8.0    Glucose, UA NEGATIVE  NEGATIVE (mg/dL)   Hgb urine dipstick SMALL (*) NEGATIVE    Bilirubin Urine NEGATIVE  NEGATIVE    Ketones, ur NEGATIVE  NEGATIVE (mg/dL)   Protein, ur NEGATIVE  NEGATIVE (mg/dL)   Urobilinogen, UA 1.0  0.0 - 1.0 (mg/dL)   Nitrite NEGATIVE  NEGATIVE    Leukocytes, UA NEGATIVE  NEGATIVE   POCT PREGNANCY, URINE      Component Value Range   Preg Test, Ur NEGATIVE  NEGATIVE   CBC      Component Value Range   WBC 16.9 (*) 4.0 - 10.5 (K/uL)   RBC 4.74  3.87 - 5.11 (MIL/uL)   Hemoglobin 15.2 (*) 12.0 - 15.0 (g/dL)   HCT 16.1  09.6 - 04.5 (%)   MCV 94.3  78.0 - 100.0 (fL)   MCH 32.1  26.0 - 34.0 (pg)   MCHC 34.0  30.0 - 36.0 (g/dL)   RDW 40.9  81.1 - 91.4 (%)   Platelets 162  150 - 400 (K/uL)  DIFFERENTIAL      Component Value Range   Neutrophils Relative 76  43 - 77 (%)   Neutro Abs 12.8 (*) 1.7 - 7.7 (K/uL)   Lymphocytes Relative 11 (*) 12 - 46 (%)   Lymphs Abs 1.9  0.7 - 4.0 (K/uL)   Monocytes Relative 13 (*) 3 - 12 (%)   Monocytes Absolute 2.1 (*) 0.1 - 1.0 (K/uL)   Eosinophils Relative 0  0 - 5 (%)   Eosinophils Absolute 0.0  0.0 - 0.7 (K/uL)   Basophils Relative 0  0 - 1 (%)   Basophils Absolute 0.0  0.0 - 0.1 (K/uL)  COMPREHENSIVE METABOLIC PANEL      Component Value Range   Sodium 137  135 - 145 (mEq/L)   Potassium 3.9  3.5 - 5.1 (mEq/L)   Chloride 103  96 - 112 (mEq/L)   CO2 20  19 - 32 (mEq/L)   Glucose, Bld 90  70 - 99 (mg/dL)   BUN 11  6 - 23 (mg/dL)   Creatinine, Ser 7.82  0.50 - 1.10 (mg/dL)   Calcium 9.8  8.4 - 95.6 (mg/dL)   Total Protein 8.0  6.0 - 8.3 (g/dL)   Albumin 4.2  3.5 - 5.2 (g/dL)   AST 18  0 - 37 (U/L)   ALT 13  0 - 35 (U/L)   Alkaline Phosphatase 79  39 - 117  (U/L)   Total Bilirubin 0.3  0.3 - 1.2 (mg/dL)   GFR calc non Af Amer >90  >90 (mL/min)   GFR calc Af Amer >90  >90 (mL/min)  LIPASE, BLOOD      Component Value Range   Lipase 13  11 - 59 (U/L)  URINE MICROSCOPIC-ADD ON      Component Value Range   Squamous Epithelial / LPF RARE  RARE    WBC, UA 0-2  <3 (WBC/hpf)   RBC / HPF 3-6  <3 (RBC/hpf)   Bacteria, UA FEW (*) RARE    CT ABDOMEN PELVIS W CONTRAST   Final Result:    IMPRESSION: Normal examination. No evidence of appendicitis.  Addendum by Danae Orleans, MD (09/05/11 19:29:30)  **ADDENDUM** CREATED: 09/05/2011 19:19:13  Subsequent review of this exam shows a poorly defined area of  decreased parenchymal enhancement in the anterior mid pole of the  right kidney. This is consistent with pyelonephritis. Mild  asymmetric swelling of the right kidney is seen but there is no  evidence of renal abscess or perinephric fluid.  These results were called by telephone on 09/05/2011 at 1920  hours to Dr. Jeraldine Loots in the emergency department, who verbally  acknowledged these results.   US PELVIS COMPLETE   Final Result:    IMPRESSION: Normal exam. No evidence of pelvic mass or other significant abnormality.  No sonographic evidence for ovarian torsion.     US TRANSVAGINAL NON-OB   Final Result:    IMPRESSION: Normal exam. No evidence of pelvic mass or other significant abnormality.  No sonographic evidence for ovarian torsion.     [place] ART/VEN FLOW ABD PELV DOPPLER   Final Result:    IMPRESSION: Normal exam. No evidence of pelvic mass or other significant abnormality.  No sonographic evidence for ovarian torsion.        MDM:  Care transferred from [facility], [place] at 16:00.  20 y.o. F with gradual, migrating abd pain, now in RLQ c/f appy.  Leukocytosis of 17 with NL lipase and LFTs.  CT abd pending.  Plan for surgery admit if positive vs TVU for r/o ovarian torsion if CT neg.  Fentanyl given for pain mgmt.  CT without  signs of acute appendicitis or pelvic pathology. Patient still with pain; thus, transvaginal ultrasound with Doppler ordered for rule out ovarian torsion.  Ultrasound negative for torsion. Radiology call back for CT over read with concern for possible right-sided pyelonephritis.  Although the pt's urine is not clearly infected, will Tx for pyelo 2/2 Sx's.  Rocephin ordered and vantin and vicodin Rx's given.  Plan for PCP f/u for persistence.     Clinical impression: 1. Right lower quadrant pain   2. Leukocytosis   3. Pyelonephritis      Particia Lather, MD 09/06/11 [id number]

## 2011-09-05 NOTE — ED Notes (Signed)
Pain is increasing

## 2011-09-05 NOTE — ED Notes (Signed)
Rt. Lower abdominal pain began yesterday,  Denies any n/v./d denies any vaginal discharge or pain , denies any UTI

## 2011-09-05 NOTE — ED Provider Notes (Signed)
History     CSN: [id number]  Arrival date & time 09/05/11  1236   First MD Initiated Contact with Patient 09/05/11 1400      Chief Complaint  Patient presents with  . Abdominal Pain    (Consider location/radiation/quality/duration/timing/severity/associated sxs/prior treatment) HPI Comments: Patient that is G2P2 presents emergency department with a chief complaint of abdominal pain.  The pain was located in the epigastric/RUQ yesterday and has moved and localized to RLQ today rapidly worsening. Pain is 10/10 severity. Pt reports having fever night sweats & chills last night. She denies N/V/D. Pt does not have a abdominal surgical history. Last meal last night at 5:30PM. Pt does not have regular menstruation bc on Mirena,  LMP 4 days ago, lasted 10 days.  The history is provided by the patient.    History reviewed. No pertinent past medical history.  History reviewed. No pertinent past surgical history.  No family history on file.  History  Substance Use Topics  . Smoking status: Current Everyday Smoker -- 0.3 packs/day    Types: Cigarettes  . Smokeless tobacco: Not on file   Comment: decreased smoking   . Alcohol Use: No    OB History    Grav Para Term Preterm Abortions TAB SAB Ect Mult Living                  Review of Systems  Constitutional: Negative for fever, chills and appetite change.  HENT: Negative for congestion.   Eyes: Negative for visual disturbance.  Respiratory: Negative for shortness of breath.   Cardiovascular: Negative for chest pain and leg swelling.  Gastrointestinal: Positive for abdominal pain.  Genitourinary: Negative for dysuria, urgency and frequency.  Neurological: Negative for dizziness, syncope, weakness, light-headedness, numbness and headaches.  Psychiatric/Behavioral: Negative for confusion.  All other systems reviewed and are negative.    Allergies  Codeine  Home Medications  No current outpatient prescriptions on file.  BP  112/67  Pulse 109  Temp(Src) 100.4 F (38 C) (Oral)  Resp 20  Ht 5\' 3"  (1.6 m)  Wt 140 lb (63.504 kg)  BMI 24.80 kg/m2  SpO2 97%  Physical Exam  Nursing note and vitals reviewed. Constitutional: She is oriented to person, place, and time. Vital signs are normal. She appears well-developed and well-nourished. No distress.  HENT:  Head: Normocephalic and atraumatic.  Mouth/Throat: Uvula is midline, oropharynx is clear and moist and mucous membranes are normal.  Eyes: Conjunctivae and EOM are normal. Pupils are equal, round, and reactive to light.  Neck: Normal range of motion and full passive range of motion without pain. Neck supple. No spinous process tenderness and no muscular tenderness present. No rigidity. No Brudzinski's sign noted.  Cardiovascular: Regular rhythm.        Tachycardic 134   Pulmonary/Chest: Effort normal and breath sounds normal. No accessory muscle usage. Not tachypneic. No respiratory distress.  Abdominal: Soft. Normal appearance. She exhibits no distension, no ascites, no pulsatile midline mass and no mass. There is tenderness in the right lower quadrant. There is no CVA tenderness. No hernia.         Patient with severe right lower quadrant abdominal pain.  No suprapubic tenderness.  Genitourinary:       Exam performed by Jaci Carrel,  exam chaperoned Date: 09/05/2011 Pelvic exam: normal external genitalia without evidence of trauma. VULVA: normal appearing vulva with no masses, tenderness or lesion. VAGINA: normal appearing vagina with normal color and discharge, no lesions. ADNEXA: normal adnexa in  size, nontender and no masses UTERUS: uterus is normal size, shape, consistency and nontender.    Musculoskeletal: Normal range of motion.  Lymphadenopathy:    She has no cervical adenopathy.  Neurological: She is alert and oriented to person, place, and time.  Skin: Skin is warm and dry. No rash noted. She is not diaphoretic.  Psychiatric: She has a  normal mood and affect. Her speech is normal and behavior is normal.    ED Course  Procedures (including critical care time)  Labs Reviewed  URINALYSIS, ROUTINE W REFLEX MICROSCOPIC - Abnormal; Notable for the following:    Hgb urine dipstick SMALL (*)    All other components within normal limits  CBC - Abnormal; Notable for the following:    WBC 16.9 (*)    Hemoglobin 15.2 (*)    All other components within normal limits  DIFFERENTIAL - Abnormal; Notable for the following:    Neutro Abs 12.8 (*)    Lymphocytes Relative 11 (*)    Monocytes Relative 13 (*)    Monocytes Absolute 2.1 (*)    All other components within normal limits  URINE MICROSCOPIC-ADD ON - Abnormal; Notable for the following:    Bacteria, UA FEW (*)    All other components within normal limits  POCT PREGNANCY, URINE  COMPREHENSIVE METABOLIC PANEL  LIPASE, BLOOD  WET PREP, GENITAL  GC/CHLAMYDIA PROBE AMP, GENITAL   No results found.   No diagnosis found.    MDM  RLQ pain   Pain not likely ovarian etiology d/t location of abdominal exam on PE including bi manual with no adenexal tenderness. CT abdpelvis to r/o appy. Pt care resumed [place], resident         [place], [place] 09/13/11 0127

## 2011-09-05 NOTE — Discharge Instructions (Signed)
Take Motrin (maximum of 2400mg  per day) or tylenol (maximum of 4000mg  per day) scheduled for 2 days then as needed for pain.  If your pain persists, take vicodin in place of your tylenol.  Complete your full course of antibiotics.  See your doctor if your symptoms persists.   Abdominal Pain (Nonspecific) Your exam might not show the exact reason you have abdominal pain. Since there are many different causes of abdominal pain, another checkup and more tests may be needed. It is very important to follow up for lasting (persistent) or worsening symptoms. A possible cause of abdominal pain in any person who still has his or her appendix is acute appendicitis. Appendicitis is often hard to diagnose. Normal blood tests, urine tests, ultrasound, and CT scans do not completely rule out early appendicitis or other causes of abdominal pain. Sometimes, only the changes that happen over time will allow appendicitis and other causes of abdominal pain to be determined. Other potential problems that may require surgery may also take time to become more apparent. Because of this, it is important that you follow all of the instructions below. HOME CARE INSTRUCTIONS   Rest as much as possible.   Do not eat solid food until your pain is gone.   While adults or children have pain: A diet of water, weak decaffeinated tea, broth or bouillon, gelatin, oral rehydration solutions (ORS), frozen ice pops, or ice chips may be helpful.   When pain is gone in adults or children: Start a light diet (dry toast, crackers, applesauce, or white rice). Increase the diet slowly as long as it does not bother you. Eat no dairy products (including cheese and eggs) and no spicy, fatty, fried, or high-fiber foods.   Use no alcohol, caffeine, or cigarettes.   Take your regular medicines unless your caregiver told you not to.   Take any prescribed medicine as directed.   Only take over-the-counter or prescription medicines for pain,  discomfort, or fever as directed by your caregiver. Do not give aspirin to children.  If your caregiver has given you a follow-up appointment, it is very important to keep that appointment. Not keeping the appointment could result in a permanent injury and/or lasting (chronic) pain and/or disability. If there is any problem keeping the appointment, you must call to reschedule.  SEEK IMMEDIATE MEDICAL CARE IF:   Your pain is not gone in 24 hours.   Your pain becomes worse, changes location, or feels different.   You or your child has an oral temperature above 102 F (38.9 C), not controlled by medicine.   Your baby is older than 3 months with a rectal temperature of 102 F (38.9 C) or higher.   Your baby is 20 months old or younger with a rectal temperature of 100.4 F (38 C) or higher.   You have shaking chills.   You keep throwing up (vomiting) or cannot drink liquids.   There is blood in your vomit or you see blood in your bowel movements.   Your bowel movements become dark or black.   You have frequent bowel movements.   Your bowel movements stop (become blocked) or you cannot pass gas.   You have bloody, frequent, or painful urination.   You have yellow discoloration in the skin or whites of the eyes.   Your stomach becomes bloated or bigger.   You have dizziness or fainting.   You have chest or back pain.  MAKE SURE YOU:   Understand these  instructions.   Will watch your condition.   Will get help right away if you are not doing well or get worse.  Document Released: 07/03/2005 Document Revised: 03/15/2011 Document Reviewed: 05/31/2009 [facility] Patient Information [address], [place].    Pyelonephritis, Adult Pyelonephritis is a kidney infection. In general, there are 2 main types of pyelonephritis:  Infections that come on quickly without any warning (acute pyelonephritis).   Infections that persist for a long period of time (chronic pyelonephritis).    CAUSES  Two main causes of pyelonephritis are:  Bacteria traveling from the bladder to the kidney. This is a problem especially in pregnant women. The urine in the bladder can become filled with bacteria from multiple causes, including:   Inflammation of the prostate gland (prostatitis).   Sexual intercourse in females.   Bladder infection (cystitis).   Bacteria traveling from the bloodstream to the tissue part of the kidney.  Problems that may increase your risk of getting a kidney infection include:  Diabetes.   Kidney stones or bladder stones.   Cancer.   Catheters placed in the bladder.   Other abnormalities of the kidney or ureter.  SYMPTOMS   Abdominal pain.   Pain in the side or flank area.   Fever.   Chills.   Upset stomach.   Blood in the urine (dark urine).   Frequent urination.   Strong or persistent urge to urinate.   Burning or stinging when urinating.  DIAGNOSIS  Your caregiver may diagnose your kidney infection based on your symptoms. A urine sample may also be taken. TREATMENT  In general, treatment depends on how severe the infection is.   If the infection is mild and caught early, your caregiver may treat you with oral antibiotics and send you home.   If the infection is more severe, the bacteria may have gotten into the bloodstream. This will require intravenous (IV) antibiotics and a hospital stay. Symptoms may include:   High fever.   Severe flank pain.   Shaking chills.   Even after a hospital stay, your caregiver may require you to be on oral antibiotics for a period of time.   Other treatments may be required depending upon the cause of the infection.  HOME CARE INSTRUCTIONS   Take your antibiotics as directed. Finish them even if you start to feel better.   Make an appointment to have your urine checked to make sure the infection is gone.   Drink enough fluids to keep your urine clear or pale yellow.   Take medicines for  the bladder if you have urgency and frequency of urination as directed by your caregiver.  SEEK IMMEDIATE MEDICAL CARE IF:   You have a fever.   You are unable to take your antibiotics or fluids.   You develop shaking chills.   You experience extreme weakness or fainting.   There is no improvement after 2 days of treatment.  MAKE SURE YOU:  Understand these instructions.   Will watch your condition.   Will get help right away if you are not doing well or get worse.  Document Released: 07/03/2005 Document Revised: 03/15/2011 Document Reviewed: 12/07/2010 [facility] Patient Information 2012 [place], [place].

## 2011-09-06 LAB — GC/CHLAMYDIA PROBE AMP, GENITAL
Chlamydia, DNA Probe: NEGATIVE
GC Probe Amp, Genital: NEGATIVE

## 2011-09-06 NOTE — ED Provider Notes (Signed)
  I performed a history and physical examination of Michelle [name] and discussed her management with Dr. Lendell Caprice.  I agree with the history, physical, assessment, and plan of care, with the following exceptions: None  This previously well female presents with abdominal pain.  On exam she has tenderness.  Ultrasound does not demonstrate acute ovarian torsion.  Patient's CT suggests pyelonephritis.  I have discussed the findings with the radiologist.  The patient was discharged following a dose of IV antibiotics. Michelle Jarvis, MD 09/06/11 [id number]

## 2011-09-07 ENCOUNTER — Ambulatory Visit (INDEPENDENT_AMBULATORY_CARE_PROVIDER_SITE_OTHER): Payer: Self-pay | Admitting: Family Medicine

## 2011-09-07 VITALS — BP 130/74 | HR 106 | Temp 98.2°F | Ht 63.0 in | Wt 141.0 lb

## 2011-09-07 DIAGNOSIS — R1031 Right lower quadrant pain: Secondary | ICD-10-CM | POA: Insufficient documentation

## 2011-09-07 MED ORDER — OXYCODONE-ACETAMINOPHEN 5-325 MG PO TABS: 1.0000 | ORAL_TABLET | Freq: Three times a day (TID) | ORAL | Status: DC | PRN

## 2011-09-07 MED ORDER — CEFTRIAXONE SODIUM 1 G IJ SOLR
1.0000 g | Freq: Once | INTRAMUSCULAR | Status: AC
  Administered 2011-09-07: 1 g via INTRAMUSCULAR

## 2011-09-07 NOTE — Patient Instructions (Signed)
Thank you for coming in today. Please fill that antibiotic.  Please come back tomorrow morning for a recheck.  Go to the ER if you have dramatically worsening abdominal pain, vomiting.

## 2011-09-07 NOTE — Assessment & Plan Note (Signed)
Unsure of etiology at this point.  Most likely diagnosis is pyelonephritis given changes on CT scan. Additionally an undetected ureteral stone is also possible.  Doubtful of appendicitis or ovarian torsion giving normal CT scan and ultrasound. Additionally given that her appendix overlies her psoas and she has a negative psoas sign and no peritoneal signs appendicitis is less likely.  Plan to give ceftriaxone 1 g IM today, and encourage patient to get Cefpodoxime. Discussed warning signs such as worsening pain or vomiting .   Patient will followup in clinic tomorrow for further evaluation

## 2011-09-07 NOTE — Progress Notes (Signed)
Michelle [name] is a 21 y.o. female who presents to [facility] today for right lower quadrant abdominal pain starting Monday. Noted acute onset of right lower quadrant pain on Monday. On Tuesday she went to the emergency room or a abdominal/pelvic CT scan revealed inflammation of the right kidney. Ultrasound did not show ovarian mass or torsion her white count was mildly elevated at 16 and her urinalysis showed mild blood with a few leukocyte esterase. She was diagnosed with pyelonephritis given a Rocephin shot and a prescription for oxycodone and Cefpodoxime and asked to followup with her primary care doctor.  She has been unable to fill the Cefpodoxime but will be able to get up today after the visit.  She continues to experience moderate to intense right lower quadrant pain. She denies any dysuria fevers or chills. Her last bowel movement we had was yesterday.  She has irregular periods and frequent vaginal bleeding of what she's had recently.  She has not been able to eat much recently but is drinking and urinating.     PMH reviewed. Recent delivery ROS as above otherwise neg Medications reviewed. Current Outpatient Prescriptions  Medication Sig Dispense Refill  . oxyCODONE-acetaminophen (ROXICET) 5-325 MG per tablet Take 1 tablet by mouth every 8 (eight) hours as needed for pain.  30 tablet  0  . cefpodoxime (VANTIN) 100 MG tablet Take 1 tablet (100 mg total) by mouth 2 (two) times daily.  14 tablet  0    Exam:  BP 130/74  Pulse 106  Temp(Src) 98.2 F (36.8 C) (Oral)  Ht 5\' 3"  (1.6 m)  Wt 141 lb (63.957 kg)  BMI 24.98 kg/m2  LMP 09/01/2011 Gen: Well  in pain appearing HEENT: EOMI,  MMM Lungs: CTABL Nl WOB Heart: RRR no MRG Abd: NABS, tender to palpation of the right lower corner no rebound or involuntary guarding.  Negative psoas sign.  No masses noted Exts: Non edematous BL  LE, warm and well perfused.

## 2011-09-08 ENCOUNTER — Encounter: Payer: Self-pay | Admitting: Family Medicine

## 2011-09-08 ENCOUNTER — Inpatient Hospital Stay (HOSPITAL_COMMUNITY)
Admission: AD | Admit: 2011-09-08 | Discharge: 2011-09-10 | DRG: 690 | Disposition: A | Discharge status: Home / Self Care | Payer: Medicaid Other | Source: Ambulatory Visit | Attending: Family Medicine | Admitting: Family Medicine

## 2011-09-08 ENCOUNTER — Ambulatory Visit (INDEPENDENT_AMBULATORY_CARE_PROVIDER_SITE_OTHER): Payer: Medicaid Other | Admitting: Family Medicine

## 2011-09-08 ENCOUNTER — Telehealth: Payer: Self-pay | Admitting: *Deleted

## 2011-09-08 ENCOUNTER — Ambulatory Visit: Payer: Medicaid Other | Admitting: Obstetrics and Gynecology

## 2011-09-08 VITALS — BP 109/78 | HR 95 | Temp 98.5°F | Ht 63.0 in | Wt 140.0 lb

## 2011-09-08 DIAGNOSIS — F319 Bipolar disorder, unspecified: Secondary | ICD-10-CM

## 2011-09-08 DIAGNOSIS — R1031 Right lower quadrant pain: Secondary | ICD-10-CM | POA: Insufficient documentation

## 2011-09-08 DIAGNOSIS — N12 Tubulo-interstitial nephritis, not specified as acute or chronic: Principal | ICD-10-CM | POA: Diagnosis present

## 2011-09-08 DIAGNOSIS — F17200 Nicotine dependence, unspecified, uncomplicated: Secondary | ICD-10-CM

## 2011-09-08 DIAGNOSIS — K37 Unspecified appendicitis: Secondary | ICD-10-CM | POA: Diagnosis present

## 2011-09-08 DIAGNOSIS — B952 Enterococcus as the cause of diseases classified elsewhere: Secondary | ICD-10-CM | POA: Diagnosis present

## 2011-09-08 DIAGNOSIS — Z79899 Other long term (current) drug therapy: Secondary | ICD-10-CM

## 2011-09-08 DIAGNOSIS — A498 Other bacterial infections of unspecified site: Secondary | ICD-10-CM | POA: Diagnosis present

## 2011-09-08 DIAGNOSIS — N10 Acute tubulo-interstitial nephritis: Secondary | ICD-10-CM

## 2011-09-08 LAB — DIFFERENTIAL
Basophils Absolute: 0 10*3/uL (ref 0.0–0.1)
Basophils Relative: 0 % (ref 0–1)
Eosinophils Absolute: 0.2 10*3/uL (ref 0.0–0.7)
Eosinophils Relative: 2 % (ref 0–5)
Lymphocytes Relative: 41 % (ref 12–46)
Lymphs Abs: 4.1 10*3/uL — ABNORMAL HIGH (ref 0.7–4.0)
Monocytes Absolute: 0.7 10*3/uL (ref 0.1–1.0)
Monocytes Relative: 7 % (ref 3–12)
Neutro Abs: 4.9 10*3/uL (ref 1.7–7.7)
Neutrophils Relative %: 49 % (ref 43–77)

## 2011-09-08 LAB — URINE CULTURE
Colony Count: >=100000
Culture  Setup Time: 201302192201

## 2011-09-08 LAB — CBC
HCT: 37 % (ref 36.0–46.0)
Hemoglobin: 12.8 g/dL (ref 12.0–15.0)
MCH: 32.5 pg (ref 26.0–34.0)
MCHC: 34.6 g/dL (ref 30.0–36.0)
MCV: 93.9 fL (ref 78.0–100.0)
Platelets: 217 10*3/uL (ref 150–400)
RBC: 3.94 MIL/uL (ref 3.87–5.11)
RDW: 12.2 % (ref 11.5–15.5)
WBC: 10 10*3/uL (ref 4.0–10.5)

## 2011-09-08 LAB — COMPREHENSIVE METABOLIC PANEL
ALT: 27 U/L (ref 0–35)
AST: 19 U/L (ref 0–37)
Albumin: 3.2 g/dL — ABNORMAL LOW (ref 3.5–5.2)
Alkaline Phosphatase: 79 U/L (ref 39–117)
BUN: 9 mg/dL (ref 6–23)
CO2: 25 mEq/L (ref 19–32)
Calcium: 9.2 mg/dL (ref 8.4–10.5)
Chloride: 101 mEq/L (ref 96–112)
Creatinine, Ser: 0.64 mg/dL (ref 0.50–1.10)
GFR calc Af Amer: >90 mL/min (ref >90)
GFR calc non Af Amer: >90 mL/min (ref >90)
Glucose, Bld: 108 mg/dL — ABNORMAL HIGH (ref 70–99)
Potassium: 3.5 mEq/L (ref 3.5–5.1)
Sodium: 138 mEq/L (ref 135–145)
Total Bilirubin: 0.1 mg/dL — ABNORMAL LOW (ref 0.3–1.2)
Total Protein: 6.8 g/dL (ref 6.0–8.3)

## 2011-09-08 MED ORDER — ACETAMINOPHEN 650 MG RE SUPP: 650.0000 mg | Freq: Four times a day (QID) | RECTAL | Status: DC | PRN

## 2011-09-08 MED ORDER — ENOXAPARIN SODIUM 40 MG/0.4ML ~~LOC~~ SOLN
40.0000 mg | SUBCUTANEOUS | Status: DC
  Administered 2011-09-08 – 2011-09-09 (×2): 40 mg via SUBCUTANEOUS
  Filled 2011-09-08 (×3): qty 0.4

## 2011-09-08 MED ORDER — NICOTINE 14 MG/24HR TD PT24
14.0000 mg | MEDICATED_PATCH | Freq: Every day | TRANSDERMAL | Status: DC
  Administered 2011-09-09: 14 mg via TRANSDERMAL
  Filled 2011-09-08 (×2): qty 1

## 2011-09-08 MED ORDER — ACETAMINOPHEN 325 MG PO TABS: 650.0000 mg | ORAL_TABLET | Freq: Four times a day (QID) | ORAL | Status: DC | PRN

## 2011-09-08 MED ORDER — ONDANSETRON HCL 4 MG/2ML IJ SOLN: 4.0000 mg | Freq: Four times a day (QID) | INTRAMUSCULAR | Status: DC | PRN

## 2011-09-08 MED ORDER — HEPARIN SODIUM (PORCINE) 5000 UNIT/ML IJ SOLN
5000.0000 [IU] | Freq: Three times a day (TID) | INTRAMUSCULAR | Status: DC
  Filled 2011-09-08 (×2): qty 1

## 2011-09-08 MED ORDER — SODIUM CHLORIDE 0.9 % IV SOLN
INTRAVENOUS | Status: DC
  Administered 2011-09-08 – 2011-09-10 (×6): via INTRAVENOUS

## 2011-09-08 MED ORDER — DEXTROSE 5 % IV SOLN
1.0000 g | INTRAVENOUS | Status: DC
  Administered 2011-09-08 – 2011-09-09 (×2): 1 g via INTRAVENOUS
  Filled 2011-09-08 (×3): qty 10

## 2011-09-08 MED ORDER — MORPHINE SULFATE 10 MG/ML IJ SOLN
1.0000 mg | Freq: Once | INTRAMUSCULAR | Status: AC
  Administered 2011-09-08: 1 mg via INTRAMUSCULAR

## 2011-09-08 MED ORDER — ONDANSETRON HCL 4 MG PO TABS: 4.0000 mg | ORAL_TABLET | Freq: Four times a day (QID) | ORAL | Status: DC | PRN

## 2011-09-08 MED ORDER — MORPHINE SULFATE 2 MG/ML IJ SOLN
2.0000 mg | INTRAMUSCULAR | Status: DC | PRN
  Administered 2011-09-08 – 2011-09-09 (×4): 2 mg via INTRAVENOUS
  Filled 2011-09-08 (×4): qty 1

## 2011-09-08 MED ORDER — MORPHINE SULFATE 4 MG/ML IJ SOLN: 1.0000 mg | Freq: Once | INTRAMUSCULAR | Status: DC

## 2011-09-08 NOTE — Telephone Encounter (Signed)
Called and informed patient bed is ready and to head over to admitting.Sharnelle Cappelli, Rodena Medin

## 2011-09-08 NOTE — H&P (Addendum)
Michelle [name] is an 21 y.o. female.   Chief Complaint: Abdominal Pain HPI:  Patient presented to clinic today with complaint of continued abdominal pain. Abdominal pain started 4 days ago.  Pain is most prominent in LRQ. Was seen in ED on 2/19 with abdominal pain and received extensive work up including CT abdomen, abd and pelvic ultrasound.  These did show some findings consistent with pyelonephritis.  No inflammation of the appendix was seen.  Gallbladder was normal and ovaries/uterus were normal.   She received 1g Ceftriaxone at that time and sent home with rx for cefpodoxime.  She followed up with our clinic on 1/21 with continued pain.  She was given 1g ceftriaxone again as she had not filled cefpodoxime and percocet for pain control.  She now comes in today still with continued pain.  She denies nausea but she does have some anorexia associated with this.   Past Medical History  Diagnosis Date  . SVD (spontaneous vaginal delivery)   . Tobacco abuse   . Bipolar I disorder   . PTSD (post-traumatic stress disorder)   . Abnormal Pap smear     No past surgical history on file.  No family history on file. Social History:  reports that she has been smoking Cigarettes.  She has been smoking about .3 packs per day. She does not have any smokeless tobacco history on file. She reports that she does not drink alcohol. Her drug history not on file.  Allergies:  Allergies  Allergen Reactions  . Codeine Hives    Medications Prior to Admission  Medication Sig Dispense Refill  . cefpodoxime (VANTIN) 100 MG tablet Take 1 tablet (100 mg total) by mouth 2 (two) times daily.  14 tablet  0  . oxyCODONE-acetaminophen (ROXICET) 5-325 MG per tablet Take 1 tablet by mouth every 8 (eight) hours as needed for pain.  30 tablet  0   Medications Prior to Admission  Medication Dose Route Frequency Provider Last Rate Last Dose  . cefTRIAXone (ROCEPHIN) injection 1 g  1 g Intramuscular Once Clementeen Graham, MD   1 g  at 09/07/11 1000    No results found for this or any previous visit (from the past 48 hour(s)). No results found.  ROS She had fever prior to ED visit but denies any since that time.  Endorses cough. Denies chest pain, shortness of breath, headache, dysuria, diarrhea, rash, joint pain, myalgias, vaginal discharge.     Blood pressure 109/78, pulse 95, temperature 98.5 F (36.9 C), temperature source Oral, height 5\' 3"  (1.6 m), weight 140 lb (63.504 kg), last menstrual period 09/01/2011. Physical Exam General appearance: alert, cooperative and appears uncomfortable. Head: Normocephalic, without obvious abnormality, atraumatic Eyes: negative findings: conjunctivae and sclerae normal and non icteric Neck: no adenopathy, supple, symmetrical, trachea midline and thyroid not enlarged, symmetric, no tenderness/mass/nodules Back: no cva tenderness, but testing does cause anterior abdominal pain.  Lungs: clear to auscultation bilaterally Breasts: normal appearance, no masses or tenderness Heart: regular rate and rhythm, S1, S2 normal, no murmur, click, rub or gallop Abdomen: NABS, Tenderness along entire R side of abdomen, most prominent in LRQ.  No distention.  no rebound Extremities: extremities normal, atraumatic, no cyanosis or edema Pulses: 2+ and symmetric Skin: Skin color, texture, turgor normal. No rashes or lesions Neurologic: Grossly normal    Assessment/Plan 21 yo female with 4 day history of abdominal pain, likely 2/2 to pyelonephritis  1. Abdominal Pain:  Has received ceftriaxone x2.  Will admit  and continue administration of ceftriaxone via IV and IV hydration given her anorexia.  Urine culture from 2/19 grew E. Coli and Enterobacter both sensitive to ceftriaxone and CT from 2/19 consistent with pyelo.  We will control her pain with morphine and hydrate with normal saline at 129mL/hr.  Recheck CBC to see if this has improved. If her pain is not improving within the next 24-48  hours or wbc count is still elevated can consider rescanning abdomen to evaluate for abscess. Differential includes PID, appendicitis, ovarian torsion, mesenteric adenitis.  Not likely PID given lack of vaginal discharge, normal pelvic ultrasound and cultures.  Ovarian torsion not seen on US/Doppler either.   I do not feel this is appendicitis given the recent CT findings as well as no peritoneal signs and CT with no signs of lymphadenitis.    2.  Bipolar I d/o:  Currently not on any medications, monitor while inpatient  3. FEN/GI:  Clear liquids for now.  IVF-NS @ 114mL/hr.    4. DVT PPX:  Heparin 5000 units TID  5.  Dispo:  Pending improvement of pain.     Keontay Vora 09/08/2011, 3:13 PM

## 2011-09-08 NOTE — Progress Notes (Addendum)
Patient ID: Michelle [name], female   DOB: [birth year], 21 y.o.   MRN: [id number]  Michelle [name] is an 21 y.o. female.    Chief Complaint: Abdominal Pain HPI:  Patient presented to clinic today with complaint of continued abdominal pain. Abdominal pain started 4 days ago.  Pain is most prominent in LRQ. Was seen in ED on 2/19 with abdominal pain and received extensive work up including CT abdomen, abd and pelvic ultrasound.  These did show some findings consistent with pyelonephritis.  No inflammation of the appendix was seen.  Gallbladder was normal and ovaries/uterus were normal.   She received 1g Ceftriaxone at that time and sent home with rx for cefpodoxime.  She followed up with our clinic on 1/21 with continued pain.  She was given 1g ceftriaxone again as she had not filled cefpodoxime and percocet for pain control.  She now comes in today still with continued pain.  She denies nausea but she does have some anorexia associated with this.   Past Medical History Diagnosis Date . SVD (spontaneous vaginal delivery)  . Tobacco abuse  . Bipolar I disorder  . PTSD (post-traumatic stress disorder)  . Abnormal Pap smear    No past surgical history on file.  No family history on file. Social History:  reports that she has been smoking Cigarettes.  She has been smoking about .3 packs per day. She does not have any smokeless tobacco history on file. She reports that she does not drink alcohol. Her drug history not on file.  Allergies:  Allergies Allergen Reactions . Codeine Hives   Medications Prior to Admission Medication Sig Dispense Refill . cefpodoxime (VANTIN) 100 MG tablet Take 1 tablet (100 mg total) by mouth 2 (two) times daily.  14 tablet  0 . oxyCODONE-acetaminophen (ROXICET) 5-325 MG per tablet Take 1 tablet by mouth every 8 (eight) hours as needed for pain.  30 tablet  0  Medications Prior to Admission Medication Dose Route Frequency Provider Last Rate Last Dose . cefTRIAXone  (ROCEPHIN) injection 1 g  1 g Intramuscular Once Clementeen Graham, MD   1 g at 09/07/11 1000   No results found for this or any previous visit (from the past 48 hour(s)). No results found.  ROS She had fever prior to ED visit but denies any since that time.  Endorses cough. Denies chest pain, shortness of breath, headache, dysuria, diarrhea, rash, joint pain, myalgias, vaginal discharge.     Blood pressure 109/78, pulse 95, temperature 98.5 F (36.9 C), temperature source Oral, height 5\' 3"  (1.6 m), weight 140 lb (63.504 kg), last menstrual period 09/01/2011. Physical Exam General appearance: alert, cooperative and appears uncomfortable. Head: Normocephalic, without obvious abnormality, atraumatic Eyes: negative findings: conjunctivae and sclerae normal and non icteric Neck: no adenopathy, supple, symmetrical, trachea midline and thyroid not enlarged, symmetric, no tenderness/mass/nodules Back: no cva tenderness, but testing does cause anterior abdominal pain.  Lungs: clear to auscultation bilaterally Breasts: normal appearance, no masses or tenderness Heart: regular rate and rhythm, S1, S2 normal, no murmur, click, rub or gallop Abdomen: NABS, Tenderness along entire R side of abdomen, most prominent in LRQ.  No distention.  no rebound Extremities: extremities normal, atraumatic, no cyanosis or edema Pulses: 2+ and symmetric Skin: Skin color, texture, turgor normal. No rashes or lesions Neurologic: Grossly normal    Assessment/Plan 21 yo female with 4 day history of abdominal pain, likely 2/2 to pyelonephritis  1. Abdominal Pain:  Has received ceftriaxone x2.  Will  admit and continue administration of ceftriaxone via IV and IV hydration given her anorexia.  Urine culture from 2/19 grew E. Coli and Enterobacter both sensitive to ceftriaxone and CT from 2/19 consistent with pyelo.  We will control her pain with morphine and hydrate with normal saline at 186mL/hr.  Recheck CBC to see if this  has improved. If her pain is not improving within the next 24-48 hours or wbc count is still elevated can consider rescanning abdomen to evaluate for abscess. Differential includes PID, appendicitis, ovarian torsion, mesenteric adenitis.  Not likely PID given lack of vaginal discharge, normal pelvic ultrasound and cultures.  Ovarian torsion not seen on US/Doppler either.   I do not feel this is appendicitis given the recent CT findings as well as no peritoneal signs and CT with no signs of lymphadenitis.    2.  Bipolar I d/o:  Currently not on any medications, monitor while inpatient  3. FEN/GI:  Clear liquids for now.  IVF-NS @ 135mL/hr.    4. DVT PPX:  Heparin 5000 units TID  5.  Dispo:  Pending improvement of pain.     Carlon Davidson 09/08/2011, 3:13 PM  FMT[facility] Attending Admission Note  Patient seen and examined by me with Dr. Ashley Royalty in the [facility], patient with R sided abdominal pain and prior evaluation which included abdominal CT, ultrasound and urine culture which grew E coli and Enterobacter; diagnosis of pyelonephritis. No evidence of appendicitis.  Patient has had poor oral intake and poor pain control with oral medications.  She does not appear to have made meaningful clinical improvement, and thus is a candidate for inpatient treatment with iv rehydration, parenteral ceftriaxone, analgesia.  Would obtain blood cultures upon admission.  Anticipate rapid improvement/turnaround. Paula Compton, M.D.

## 2011-09-08 NOTE — Progress Notes (Signed)
Addended by: Everrett Coombe on: 09/08/2011 05:23 PM   Modules accepted: Orders

## 2011-09-09 ENCOUNTER — Inpatient Hospital Stay (HOSPITAL_COMMUNITY): Payer: Medicaid Other

## 2011-09-09 LAB — CBC
HCT: 36.3 % (ref 36.0–46.0)
Hemoglobin: 12.1 g/dL (ref 12.0–15.0)
MCH: 31.3 pg (ref 26.0–34.0)
MCHC: 33.3 g/dL (ref 30.0–36.0)
MCV: 93.8 fL (ref 78.0–100.0)
Platelets: 212 10*3/uL (ref 150–400)
RBC: 3.87 MIL/uL (ref 3.87–5.11)
RDW: 12.3 % (ref 11.5–15.5)
WBC: 9.8 10*3/uL (ref 4.0–10.5)

## 2011-09-09 LAB — BASIC METABOLIC PANEL
BUN: 7 mg/dL (ref 6–23)
CO2: 25 mEq/L (ref 19–32)
Calcium: 8.8 mg/dL (ref 8.4–10.5)
Chloride: 106 mEq/L (ref 96–112)
Creatinine, Ser: 0.57 mg/dL (ref 0.50–1.10)
GFR calc Af Amer: >90 mL/min (ref >90)
GFR calc non Af Amer: >90 mL/min (ref >90)
Glucose, Bld: 101 mg/dL — ABNORMAL HIGH (ref 70–99)
Potassium: 3.8 mEq/L (ref 3.5–5.1)
Sodium: 139 mEq/L (ref 135–145)

## 2011-09-09 MED ORDER — IOHEXOL 300 MG/ML  SOLN
100.0000 mL | Freq: Once | INTRAMUSCULAR | Status: AC | PRN
  Administered 2011-09-09: 100 mL via INTRAVENOUS

## 2011-09-09 MED ORDER — ACETAMINOPHEN 325 MG PO TABS
650.0000 mg | ORAL_TABLET | Freq: Four times a day (QID) | ORAL | Status: DC | PRN
  Administered 2011-09-09: 650 mg via ORAL
  Filled 2011-09-09: qty 2

## 2011-09-09 MED ORDER — MORPHINE SULFATE 4 MG/ML IJ SOLN
4.0000 mg | INTRAMUSCULAR | Status: DC | PRN
  Administered 2011-09-09 (×2): 4 mg via INTRAVENOUS
  Administered 2011-09-09: 2 mg via INTRAVENOUS
  Administered 2011-09-09 (×3): 4 mg via INTRAVENOUS
  Filled 2011-09-09 (×6): qty 1

## 2011-09-09 MED ORDER — MORPHINE SULFATE 2 MG/ML IJ SOLN
1.0000 mg | INTRAMUSCULAR | Status: DC | PRN
  Administered 2011-09-09 – 2011-09-10 (×4): 1 mg via INTRAVENOUS
  Filled 2011-09-09 (×4): qty 1

## 2011-09-09 MED ORDER — OXYCODONE-ACETAMINOPHEN 5-325 MG PO TABS
1.0000 | ORAL_TABLET | Freq: Four times a day (QID) | ORAL | Status: DC
  Administered 2011-09-09 – 2011-09-10 (×3): 1 via ORAL
  Filled 2011-09-09 (×3): qty 1

## 2011-09-09 MED ORDER — IOHEXOL 300 MG/ML  SOLN
20.0000 mL | INTRAMUSCULAR | Status: AC
  Administered 2011-09-09 (×2): 20 mL via ORAL

## 2011-09-09 MED ORDER — MORPHINE SULFATE 4 MG/ML IJ SOLN: 4.0000 mg | INTRAMUSCULAR | Status: DC | PRN

## 2011-09-09 NOTE — Progress Notes (Signed)
Subjective: Interval History: has complaint of persistent RLQ pain 8=9/10. Denies nausea or vomiting. Ambulating and voiding without difficulty. Tolerating liquids. Asking to eat solids.   Objective: Vital signs in last 24 hours: Temp:  [98.2 F (36.8 C)-98.8 F (37.1 C)] 98.2 F (36.8 C) (02/23 0556) Pulse Rate:  [70-95] 72  (02/23 0656) Resp:  [18-20] 20  (02/23 0656) BP: (86-112)/(48-78) 92/50 mmHg (02/23 0814) SpO2:  [99 %-100 %] 100 % (02/23 0556) Weight:  [139 lb 6.4 oz (63.231 kg)-140 lb (63.504 kg)] 139 lb 6.4 oz (63.231 kg) (02/22 1835)  Intake/Output from previous day: 02/22 0701 - 02/23 0700 In: 1360.4 [I.V.:1310.4] Out: 1400 [Urine:1400] Intake/Output this shift: Total I/O In: 120 [P.O.:120] Out: 525 [Urine:525]  General appearance: alert, cooperative and mild distress Throat: lips, mucosa, and tongue normal; teeth and gums normal Lungs: clear to auscultation bilaterally Heart: regular rate and rhythm, S1, S2 normal, no murmur, click, rub or gallop Abdomen: Flat. NABS. RLQ pain w/o rebound or guarding. Positive obturator sign. Negative Psoas or Rovsing's. R CVA tenderness.  Skin: Skin color, texture, turgor normal. No rashes or lesions  Results for orders placed during the hospital encounter of 09/08/11 (from the past 24 hour(s))  CBC     Status: Normal   Collection Time   09/08/11  8:56 PM      Component Value Range   WBC 10.0  4.0 - 10.5 (K/uL)   RBC 3.94  3.87 - 5.11 (MIL/uL)   Hemoglobin 12.8  12.0 - 15.0 (g/dL)   HCT 16.1  09.6 - 04.5 (%)   MCV 93.9  78.0 - 100.0 (fL)   MCH 32.5  26.0 - 34.0 (pg)   MCHC 34.6  30.0 - 36.0 (g/dL)   RDW 40.9  81.1 - 91.4 (%)   Platelets 217  150 - 400 (K/uL)  COMPREHENSIVE METABOLIC PANEL     Status: Abnormal   Collection Time   09/08/11  8:56 PM      Component Value Range   Sodium 138  135 - 145 (mEq/L)   Potassium 3.5  3.5 - 5.1 (mEq/L)   Chloride 101  96 - 112 (mEq/L)   CO2 25  19 - 32 (mEq/L)   Glucose, Bld 108 (*)  70 - 99 (mg/dL)   BUN 9  6 - 23 (mg/dL)   Creatinine, Ser 7.82  0.50 - 1.10 (mg/dL)   Calcium 9.2  8.4 - 95.6 (mg/dL)   Total Protein 6.8  6.0 - 8.3 (g/dL)   Albumin 3.2 (*) 3.5 - 5.2 (g/dL)   AST 19  0 - 37 (U/L)   ALT 27  0 - 35 (U/L)   Alkaline Phosphatase 79  39 - 117 (U/L)   Total Bilirubin 0.1 (*) 0.3 - 1.2 (mg/dL)   GFR calc non Af Amer >90  >90 (mL/min)   GFR calc Af Amer >90  >90 (mL/min)  DIFFERENTIAL     Status: Abnormal   Collection Time   09/08/11  8:56 PM      Component Value Range   Neutrophils Relative 49  43 - 77 (%)   Neutro Abs 4.9  1.7 - 7.7 (K/uL)   Lymphocytes Relative 41  12 - 46 (%)   Lymphs Abs 4.1 (*) 0.7 - 4.0 (K/uL)   Monocytes Relative 7  3 - 12 (%)   Monocytes Absolute 0.7  0.1 - 1.0 (K/uL)   Eosinophils Relative 2  0 - 5 (%)   Eosinophils Absolute 0.2  0.0 -  0.7 (K/uL)   Basophils Relative 0  0 - 1 (%)   Basophils Absolute 0.0  0.0 - 0.1 (K/uL)  BASIC METABOLIC PANEL     Status: Abnormal   Collection Time   09/09/11  7:00 AM      Component Value Range   Sodium 139  135 - 145 (mEq/L)   Potassium 3.8  3.5 - 5.1 (mEq/L)   Chloride 106  96 - 112 (mEq/L)   CO2 25  19 - 32 (mEq/L)   Glucose, Bld 101 (*) 70 - 99 (mg/dL)   BUN 7  6 - 23 (mg/dL)   Creatinine, Ser 1.61  0.50 - 1.10 (mg/dL)   Calcium 8.8  8.4 - 09.6 (mg/dL)   GFR calc non Af Amer >90  >90 (mL/min)   GFR calc Af Amer >90  >90 (mL/min)  CBC     Status: Normal   Collection Time   09/09/11  7:00 AM      Component Value Range   WBC 9.8  4.0 - 10.5 (K/uL)   RBC 3.87  3.87 - 5.11 (MIL/uL)   Hemoglobin 12.1  12.0 - 15.0 (g/dL)   HCT 04.5  40.9 - 81.1 (%)   MCV 93.8  78.0 - 100.0 (fL)   MCH 31.3  26.0 - 34.0 (pg)   MCHC 33.3  30.0 - 36.0 (g/dL)   RDW 91.4  78.2 - 95.6 (%)   Platelets 212  150 - 400 (K/uL)    Micro: Urine culture: >=100,000 COLONIES/ML Culture ESCHERICHIA COLI ENTEROBACTER AEROGENES  Studies/Results: US Transvaginal and Pelvis 09/05/11: negative. With normal doppler  flow to both ovaries.   Ct Abdomen Pelvis W Contrast 09/05/2011:  poorly defined area of decreased parenchymal enhancement in the anterior mid pole of the right kidney.  This is consistent with pyelonephritis.  Mild asymmetric swelling of the right kidney is seen but there is no evidence of renal abscess or perinephric fluid.      Scheduled Meds:    . cefTRIAXone (ROCEPHIN)  IV  1 g Intravenous Q24H  . enoxaparin (LOVENOX) injection  40 mg Subcutaneous Q24H  . nicotine  14 mg Transdermal Daily  . DISCONTD: heparin  5,000 Units Subcutaneous Q8H   Continuous Infusions:   . sodium chloride 125 mL/hr at 09/09/11 0306   PRN Meds:acetaminophen, acetaminophen, morphine, ondansetron (ZOFRAN) IV, ondansetron, DISCONTD: morphine  Assessment/Plan: 21 yo F with 4 days of RLQ in the setting of positive UCx for E. Coli and enterococcus and evidence of pylelonephrits.  on abdominal CT, otherwise negative work up.   1. Abdominal pain A: Persistent pain treating for upper urinary tract infection. The patient's exam is also concerning for developing appendicitis although her appendix was normal on CT abdomen obtained on 09/05/11.  P: -make pt NPO -obtain abdominal ultrasound specifically to evaluate the appendix.  -pain control with prn morphine.  -Continue IV ceftriaxone.  -if Ab U/S is negative will advance diet and start PO pain medicine.  -CBC in AM monitor for fever or elevated WBC.    2. DVT prophylaxis: Lovenox.   3. Tobacco abuse: continue nicoderm.  4. FEN/GI: NPO for now. Continue IVFs and check BMET in AM.   LOS: 1 day   [facility]

## 2011-09-09 NOTE — Progress Notes (Addendum)
Family Medicine Teaching Service Attending Note  I interviewed and examined patient Michelle [name] and reviewed their tests and x-rays.  I discussed with Dr. Armen [name] and reviewed their note for today.  I agree with their assessment and plan.     Additionally  Continues with significant R mid abdomen pain that is worse with palpation and with inspiration.  No fever or wbc.  Lungs are clear but poor inspiration CT reviewed Lungs normal no masses seen  Continue IV antibiotics and analgesics.  Review previously ordered US If pain conitnues would discuss with radiology and liked repeat CT   After discussing the plan of car with my attending. I will cancel the abdominal ultrasound and restart the patient's diet with orders to advance at tolerated. If she has worsening pain will make her NPO again and repeat the CT of the abdomen with contrast.  Michelle [name] 1:25 PM 09/09/11

## 2011-09-10 LAB — CBC
HCT: 33.9 % — ABNORMAL LOW (ref 36.0–46.0)
Hemoglobin: 11.6 g/dL — ABNORMAL LOW (ref 12.0–15.0)
MCH: 31.8 pg (ref 26.0–34.0)
MCHC: 34.2 g/dL (ref 30.0–36.0)
MCV: 92.9 fL (ref 78.0–100.0)
Platelets: 195 10*3/uL (ref 150–400)
RBC: 3.65 MIL/uL — ABNORMAL LOW (ref 3.87–5.11)
RDW: 12 % (ref 11.5–15.5)
WBC: 8.1 10*3/uL (ref 4.0–10.5)

## 2011-09-10 LAB — BASIC METABOLIC PANEL
BUN: 4 mg/dL — ABNORMAL LOW (ref 6–23)
CO2: 25 mEq/L (ref 19–32)
Calcium: 8.9 mg/dL (ref 8.4–10.5)
Chloride: 109 mEq/L (ref 96–112)
Creatinine, Ser: 0.55 mg/dL (ref 0.50–1.10)
GFR calc Af Amer: >90 mL/min (ref >90)
GFR calc non Af Amer: >90 mL/min (ref >90)
Glucose, Bld: 108 mg/dL — ABNORMAL HIGH (ref 70–99)
Potassium: 3.6 mEq/L (ref 3.5–5.1)
Sodium: 142 mEq/L (ref 135–145)

## 2011-09-10 MED ORDER — NICOTINE 14 MG/24HR TD PT24: 1.0000 | MEDICATED_PATCH | Freq: Every day | TRANSDERMAL | Status: DC

## 2011-09-10 MED ORDER — OXYCODONE-ACETAMINOPHEN 5-325 MG PO TABS: 2.0000 | ORAL_TABLET | ORAL | Status: AC | PRN

## 2011-09-10 MED ORDER — CEPHALEXIN 500 MG PO CAPS: 500.0000 mg | ORAL_CAPSULE | Freq: Three times a day (TID) | ORAL | Status: DC

## 2011-09-10 MED ORDER — CEPHALEXIN 500 MG PO CAPS: 500.0000 mg | ORAL_CAPSULE | Freq: Three times a day (TID) | ORAL | Status: AC

## 2011-09-10 NOTE — Discharge Instructions (Signed)
Pyelonephritis, Adult Pyelonephritis is a kidney infection. In general, there are 2 main types of pyelonephritis:  Infections that come on quickly without any warning (acute pyelonephritis).   Infections that persist for a long period of time (chronic pyelonephritis).  CAUSES  Two main causes of pyelonephritis are:  Bacteria traveling from the bladder to the kidney. This is a problem especially in pregnant women. The urine in the bladder can become filled with bacteria from multiple causes, including:   Inflammation of the prostate gland (prostatitis).   Sexual intercourse in females.   Bladder infection (cystitis).   Bacteria traveling from the bloodstream to the tissue part of the kidney.  Problems that may increase your risk of getting a kidney infection include:  Diabetes.   Kidney stones or bladder stones.   Cancer.   Catheters placed in the bladder.   Other abnormalities of the kidney or ureter.  SYMPTOMS   Abdominal pain.   Pain in the side or flank area.   Fever.   Chills.   Upset stomach.   Blood in the urine (dark urine).   Frequent urination.   Strong or persistent urge to urinate.   Burning or stinging when urinating.  DIAGNOSIS  Your caregiver may diagnose your kidney infection based on your symptoms. A urine sample may also be taken. TREATMENT  In general, treatment depends on how severe the infection is.   If the infection is mild and caught early, your caregiver may treat you with oral antibiotics and send you home.   If the infection is more severe, the bacteria may have gotten into the bloodstream. This will require intravenous (IV) antibiotics and a hospital stay. Symptoms may include:   High fever.   Severe flank pain.   Shaking chills.   Even after a hospital stay, your caregiver may require you to be on oral antibiotics for a period of time.   Other treatments may be required depending upon the cause of the infection.  HOME CARE  INSTRUCTIONS   Take your antibiotics as directed. Finish them even if you start to feel better.   Make an appointment to have your urine checked to make sure the infection is gone.   Drink enough fluids to keep your urine clear or pale yellow.   Take medicines for the bladder if you have urgency and frequency of urination as directed by your caregiver.  SEEK IMMEDIATE MEDICAL CARE IF:   You have a fever.   You are unable to take your antibiotics or fluids.   You develop shaking chills.   You experience extreme weakness or fainting.   There is no improvement after 2 days of treatment.  MAKE SURE YOU:  Understand these instructions.   Will watch your condition.   Will get help right away if you are not doing well or get worse.  Document Released: 07/03/2005 Document Revised: 03/15/2011 Document Reviewed: 12/07/2010 [employer] Patient Information 2012 [employer]. 

## 2011-09-10 NOTE — Progress Notes (Signed)
Family Medicine Teaching Service Attending Note  I interviewed and examined patient Michelle [name] and reviewed their tests and x-rays.  I discussed with Dr. Armen Pickup and reviewed their note for today.  I agree with their assessment and plan.     Additionally  Feels some better.  Repeat CT only shows likely pyelo.  Treat with 14 days total of antibiotics and follow up for resolution of her pain

## 2011-09-10 NOTE — Discharge Summary (Signed)
I have reviewed this discharge summary and agree.    

## 2011-09-10 NOTE — Discharge Summary (Signed)
Physician Discharge Summary  Patient ID: Michelle [name] MRN: [id number] DOB/AGE: Mar 09, 1991 20 y.o.  Admit date: 09/08/2011 Discharge date: 09/10/2011  Admission Diagnoses: pyelonephritis, question appendicitis    Discharge Diagnoses:  Active Problems:  Pyelonephritis   DSORD BIPOLAR I, UNSPC, MOST RECENT EPSD  SMOKER   Discharged Condition: fair  Hospital Course:  21 yo F presents with uncontrolled abdominal pain in the setting of pyelonephritis.   1. Abdominal pain: attributed to pyelonephritis but given worsening pain she was admitted and  evaluated for additional sources of pain including appendicitis, ovarian torsion, PID. She was treated with IV Ceftriaxone.  Urine culture was positive for E. Coli and enterobacter sensitive to 3rd generation cephalosporins. Pain controlled with IV morphine, percocet and tylenol.  Repeat CT of the abdomen and pelvis was positive only for R pyelonephritis on 09/09/11. On dat 2. Tobacco abuse: started on nicotine patch.   Consults: None  Significant Diagnostic Studies:  GC/Chlam: negative U preg : negative  UCx: E. coli and enterobacter  > 100, 000  CT Abd and pelvis with contrast  2/19  Mild  asymmetric swelling of the right kidney is seen but there is no  evidence of renal abscess or perinephric fluid. CT Abd and pelvis with contrast 2/23: stable changes of right pyelonephritis  Treatments: IV hydration, antibiotics: ceftriaxone and analgesia: Morphine and percocet.   Discharge Exam: Blood pressure 110/65, pulse 72, temperature 97.9 F (36.6 C), temperature source Oral, resp. rate 18, height 5\' 3"  (1.6 m), weight 139 lb 6.4 oz (63.231 kg), last menstrual period 09/01/2011, SpO2 99.00%. General appearance: alert, cooperative and mild distress  Throat: lips, mucosa, and tongue normal; teeth and gums normal  Lungs: clear to auscultation bilaterally  Heart: regular rate and rhythm, S1, S2 normal, no murmur, click, rub or gallop  Abdomen:  Flat. NABS. RLQ pain w/o rebound or guarding.  Skin: Skin color, texture, turgor normal. No rashes or lesions   Disposition: Home-Health Care Svc  Discharge Orders    Future Orders Please Complete By Expires   Diet - low sodium heart healthy      Increase activity slowly      Call MD for:  persistant nausea and vomiting      Call MD for:  severe uncontrolled pain        Medication List  As of 09/10/2011  8:52 AM   STOP taking these medications         cefpodoxime 100 MG tablet         TAKE these medications         cephALEXin 500 MG capsule   Commonly known as: KEFLEX   Take 1 capsule (500 mg total) by mouth 3 (three) times daily.      nicotine 14 mg/24hr patch   Commonly known as: NICODERM CQ - dosed in mg/24 hours   Place 1 patch onto the skin daily.      oxyCODONE-acetaminophen 5-325 MG per tablet   Commonly known as: PERCOCET   Take 2 tablets by mouth every 4 (four) hours as needed. For pain.           Follow-up Information    Follow up with CAVINESS,DAWN, MD. Call in 1 week.   Contact information:   [address] [place] [address] [phone]          Signed: Dessa Phi 09/10/2011, 8:52 AM

## 2011-09-10 NOTE — Progress Notes (Addendum)
Subjective: Interval History: patient's pain is well controlled currently. She denies nausea or vomiting.    Objective: Vital signs in last 24 hours: Temp:  [97.7 F (36.5 C)-97.9 F (36.6 C)] 97.9 F (36.6 C) (02/24 0537) Pulse Rate:  [71-78] 72  (02/24 0537) Resp:  [18-20] 18  (02/24 0537) BP: (88-110)/(48-65) 110/65 mmHg (02/24 0537) SpO2:  [99 %-100 %] 99 % (02/24 0537)  Intake/Output from previous day: 02/23 0701 - 02/24 0700 In: 960 [P.O.:960] Out: 1275 [Urine:1275] Intake/Output this shift: Total I/O In: -  Out: 300 [Urine:300]  General appearance: alert, cooperative and mild distress Throat: lips, mucosa, and tongue normal; teeth and gums normal Lungs: clear to auscultation bilaterally Heart: regular rate and rhythm, S1, S2 normal, no murmur, click, rub or gallop Abdomen: Flat. NABS. RLQ pain w/o rebound or guarding.  Skin: Skin color, texture, turgor normal. No rashes or lesions  Results for orders placed during the hospital encounter of 09/08/11 (from the past 24 hour(s))  CBC     Status: Abnormal   Collection Time   09/10/11  6:20 AM      Component Value Range   WBC 8.1  4.0 - 10.5 (K/uL)   RBC 3.65 (*) 3.87 - 5.11 (MIL/uL)   Hemoglobin 11.6 (*) 12.0 - 15.0 (g/dL)   HCT 16.1 (*) 09.6 - 46.0 (%)   MCV 92.9  78.0 - 100.0 (fL)   MCH 31.8  26.0 - 34.0 (pg)   MCHC 34.2  30.0 - 36.0 (g/dL)   RDW 04.5  40.9 - 81.1 (%)   Platelets 195  150 - 400 (K/uL)  BASIC METABOLIC PANEL     Status: Abnormal   Collection Time   09/10/11  6:20 AM      Component Value Range   Sodium 142  135 - 145 (mEq/L)   Potassium 3.6  3.5 - 5.1 (mEq/L)   Chloride 109  96 - 112 (mEq/L)   CO2 25  19 - 32 (mEq/L)   Glucose, Bld 108 (*) 70 - 99 (mg/dL)   BUN 4 (*) 6 - 23 (mg/dL)   Creatinine, Ser 9.14  0.50 - 1.10 (mg/dL)   Calcium 8.9  8.4 - 78.2 (mg/dL)   GFR calc non Af Amer >90  >90 (mL/min)   GFR calc Af Amer >90  >90 (mL/min)    Micro: Urine culture: >=100,000 COLONIES/ML Culture  ESCHERICHIA COLI ENTEROBACTER AEROGENES  Studies/Results: US Transvaginal and Pelvis 09/05/11: negative. With normal doppler flow to both ovaries.   CT Abdomen Pelvis W Contrast 09/05/2011:  poorly defined area of decreased parenchymal enhancement in the anterior mid pole of the right kidney.  This is consistent with pyelonephritis.  Mild asymmetric swelling of the right kidney is seen but there is no evidence of renal abscess or perinephric fluid.    CT Abdomen Pelvis W Contrast 09/10/11: Stable changes of right pyelonephritis.  Normal appendix. No changes since prior study.    Scheduled Meds:    . cefTRIAXone (ROCEPHIN)  IV  1 g Intravenous Q24H  . enoxaparin (LOVENOX) injection  40 mg Subcutaneous Q24H  . iohexol  20 mL Oral Q1 Hr x 2  . nicotine  14 mg Transdermal Daily  . oxyCODONE-acetaminophen  1 tablet Oral Q6H   Continuous Infusions:    . sodium chloride 125 mL/hr at 09/10/11 0327   PRN Meds:acetaminophen, acetaminophen, iohexol, morphine, ondansetron (ZOFRAN) IV, DISCONTD: acetaminophen, DISCONTD: morphine, DISCONTD: morphine, DISCONTD: ondansetron  Assessment/Plan: 21 yo F with 4 days of RLQ  in the setting of positive UCx for E. Coli and enterococcus and evidence of pylelonephrits.  on abdominal CT x 2, otherwise negative work up.   1. Abdominal pain A: Persistent pain treating for pyelonephritis. Afebrile with normal WBC. Tolerating orals.  P: -D/C IVF and IV morphine -pain control with percocet. Discussed with patient and her boyfriend that she should take it regularly as first then space out to zero.  -Transition to keflex to complete a 7 days course of antibiotics.   2. DVT prophylaxis: Lovenox.   3. Tobacco abuse: continue nicoderm.  4. FEN/GI: Advance diet as tolerated.   5. Dispo: D/C to home today.    LOS: 2 days   [facility]

## 2011-09-14 NOTE — ED Provider Notes (Signed)
Medical screening examination/treatment/procedure(s) were performed by non-physician practitioner and as supervising physician I was immediately available for consultation/collaboration.   Laray Anger, DO 09/14/11 1612

## 2011-09-15 LAB — CULTURE, BLOOD (ROUTINE X 2)
Culture  Setup Time: 201302230116
Culture  Setup Time: 201302230117
Culture: NO GROWTH
Culture: NO GROWTH

## 2011-09-19 ENCOUNTER — Inpatient Hospital Stay: Payer: Medicaid Other | Admitting: Family Medicine

## 2011-09-22 ENCOUNTER — Ambulatory Visit (HOSPITAL_COMMUNITY)
Admission: RE | Admit: 2011-09-22 | Discharge: 2011-09-22 | Disposition: A | Discharge status: Home / Self Care | Payer: Medicaid Other | Source: Ambulatory Visit | Attending: Family Medicine | Admitting: Family Medicine

## 2011-09-22 ENCOUNTER — Ambulatory Visit (INDEPENDENT_AMBULATORY_CARE_PROVIDER_SITE_OTHER): Payer: Medicaid Other | Admitting: Family Medicine

## 2011-09-22 ENCOUNTER — Other Ambulatory Visit: Payer: Self-pay | Admitting: Family Medicine

## 2011-09-22 ENCOUNTER — Encounter: Payer: Self-pay | Admitting: Family Medicine

## 2011-09-22 DIAGNOSIS — Z975 Presence of (intrauterine) contraceptive device: Secondary | ICD-10-CM | POA: Insufficient documentation

## 2011-09-22 DIAGNOSIS — R109 Unspecified abdominal pain: Secondary | ICD-10-CM

## 2011-09-22 LAB — CBC WITH DIFFERENTIAL/PLATELET
Basophils Absolute: 0 10*3/uL (ref 0.0–0.1)
Basophils Relative: 0 % (ref 0–1)
Eosinophils Absolute: 0.1 10*3/uL (ref 0.0–0.7)
Eosinophils Relative: 1 % (ref 0–5)
HCT: 43.4 % (ref 36.0–46.0)
Hemoglobin: 14.9 g/dL (ref 12.0–15.0)
Lymphocytes Relative: 31 % (ref 12–46)
Lymphs Abs: 4 10*3/uL (ref 0.7–4.0)
MCH: 32 pg (ref 26.0–34.0)
MCHC: 34.3 g/dL (ref 30.0–36.0)
MCV: 93.1 fL (ref 78.0–100.0)
Monocytes Absolute: 1.1 10*3/uL — ABNORMAL HIGH (ref 0.1–1.0)
Monocytes Relative: 9 % (ref 3–12)
Neutro Abs: 7.5 10*3/uL (ref 1.7–7.7)
Neutrophils Relative %: 59 % (ref 43–77)
Platelets: 278 10*3/uL (ref 150–400)
RBC: 4.66 MIL/uL (ref 3.87–5.11)
RDW: 12.6 % (ref 11.5–15.5)
WBC: 12.7 10*3/uL — ABNORMAL HIGH (ref 4.0–10.5)

## 2011-09-22 LAB — COMPREHENSIVE METABOLIC PANEL
ALT: 10 U/L (ref 0–35)
AST: 15 U/L (ref 0–37)
Albumin: 4.5 g/dL (ref 3.5–5.2)
Alkaline Phosphatase: 88 U/L (ref 39–117)
BUN: 10 mg/dL (ref 6–23)
CO2: 26 mEq/L (ref 19–32)
Calcium: 9.6 mg/dL (ref 8.4–10.5)
Chloride: 105 mEq/L (ref 96–112)
Creat: 0.75 mg/dL (ref 0.50–1.10)
Glucose, Bld: 88 mg/dL (ref 70–99)
Potassium: 4.6 mEq/L (ref 3.5–5.3)
Sodium: 139 mEq/L (ref 135–145)
Total Bilirubin: 0.2 mg/dL — ABNORMAL LOW (ref 0.3–1.2)
Total Protein: 7.1 g/dL (ref 6.0–8.3)

## 2011-09-22 LAB — POCT URINALYSIS DIPSTICK
Bilirubin, UA: NEGATIVE
Blood, UA: NEGATIVE
Glucose, UA: NEGATIVE
Ketones, UA: NEGATIVE
Leukocytes, UA: NEGATIVE
Nitrite, UA: NEGATIVE
Protein, UA: NEGATIVE
Spec Grav, UA: 1.015
Urobilinogen, UA: 0.2
pH, UA: 7

## 2011-09-22 LAB — LIPASE: Lipase: 20 U/L (ref 0–75)

## 2011-09-22 MED ORDER — OXYCODONE-ACETAMINOPHEN 10-325 MG PO TABS: 1.0000 | ORAL_TABLET | ORAL | Status: DC | PRN

## 2011-09-22 NOTE — Progress Notes (Signed)
  Subjective:    Patient ID: Michelle [name], female    DOB: March 16, 1991, 21 y.o.   MRN: [id number]  HPI Abdominal pain: Patient discharged Hospital on February 24. Had right-sided abdominal pain that was persistent. Diagnosed with possible pyelonephritis. Had ultrasound as well as CT that were normal. Urine culture positive for Escherichia coli sensitive to Keflex. CT showed right-sided pyelonephritis. Patient here today for hospital followup. Says that right-sided pain now improved. But now left side is beginning to hurt for the past 2-3 days. This was not present during hospital stay. Has been taking Percocet as needed to control pain. Now out of Percocet. Positive nausea. No vomiting. No fever. No back pain. No burning with urination. No blood in urine. No blood in stool. No dark stools. Positive loose stools since being on antibiotics. Decreased appetite. But good by mouth fluid intake. No vaginal discharge. No vaginal bleeding. No history of STDs. Negative for gonorrhea and chlamydia on February 19.   Review of Systems As per above.    Objective:   Physical Exam  Constitutional: She appears well-developed and well-nourished.  HENT:  Head: Normocephalic and atraumatic.  Eyes: Pupils are equal, round, and reactive to light. Right eye exhibits no discharge. Left eye exhibits no discharge.  Neck: Normal range of motion.  Cardiovascular: Normal rate, regular rhythm and normal heart sounds.   No murmur heard. Pulmonary/Chest: Effort normal. No respiratory distress. She has no wheezes. She has no rales. She exhibits no tenderness.  Abdominal: Soft. She exhibits no distension and no mass. There is tenderness (LLQ mild-mod tenderness on exam, mild tenderness in RLQ). There is no rebound and no guarding.  Musculoskeletal: She exhibits no edema.  Lymphadenopathy:    She has no cervical adenopathy.  Neurological: She is alert.  Skin: No rash noted.  Psychiatric: She has a normal mood and affect.  Her behavior is normal.          Assessment & Plan:

## 2011-09-22 NOTE — Patient Instructions (Addendum)
Abdominal pain: Please schedule a follow up appointment with me on Tuesday.  We will discuss lab results and ultrasound at your appointment. If any new or worsening of symptoms return go to the ER Take percocet as needed for pain.

## 2011-09-25 ENCOUNTER — Emergency Department (HOSPITAL_COMMUNITY): Payer: Medicaid Other

## 2011-09-25 ENCOUNTER — Encounter (HOSPITAL_COMMUNITY): Payer: Self-pay | Admitting: Emergency Medicine

## 2011-09-25 ENCOUNTER — Emergency Department (HOSPITAL_COMMUNITY)
Admission: EM | Admit: 2011-09-25 | Discharge: 2011-09-25 | Disposition: A | Discharge status: Home / Self Care | Payer: Medicaid Other | Attending: Emergency Medicine | Admitting: Emergency Medicine

## 2011-09-25 DIAGNOSIS — F17200 Nicotine dependence, unspecified, uncomplicated: Secondary | ICD-10-CM | POA: Insufficient documentation

## 2011-09-25 DIAGNOSIS — R109 Unspecified abdominal pain: Secondary | ICD-10-CM | POA: Insufficient documentation

## 2011-09-25 DIAGNOSIS — F319 Bipolar disorder, unspecified: Secondary | ICD-10-CM | POA: Insufficient documentation

## 2011-09-25 DIAGNOSIS — R10813 Right lower quadrant abdominal tenderness: Secondary | ICD-10-CM | POA: Insufficient documentation

## 2011-09-25 LAB — CBC
HCT: 40.7 % (ref 36.0–46.0)
Hemoglobin: 14.3 g/dL (ref 12.0–15.0)
MCH: 32.2 pg (ref 26.0–34.0)
MCHC: 35.1 g/dL (ref 30.0–36.0)
MCV: 91.7 fL (ref 78.0–100.0)
Platelets: 224 10*3/uL (ref 150–400)
RBC: 4.44 MIL/uL (ref 3.87–5.11)
RDW: 12.5 % (ref 11.5–15.5)
WBC: 11.8 10*3/uL — ABNORMAL HIGH (ref 4.0–10.5)

## 2011-09-25 LAB — URINALYSIS, ROUTINE W REFLEX MICROSCOPIC
Bilirubin Urine: NEGATIVE
Glucose, UA: NEGATIVE mg/dL
Hgb urine dipstick: NEGATIVE
Ketones, ur: NEGATIVE mg/dL
Leukocytes, UA: NEGATIVE
Nitrite: NEGATIVE
Protein, ur: NEGATIVE mg/dL
Specific Gravity, Urine: 1.015 (ref 1.005–1.030)
Urobilinogen, UA: 1 mg/dL (ref 0.0–1.0)
pH: 7.5 (ref 5.0–8.0)

## 2011-09-25 LAB — COMPREHENSIVE METABOLIC PANEL
ALT: 9 U/L (ref 0–35)
AST: 17 U/L (ref 0–37)
Albumin: 3.9 g/dL (ref 3.5–5.2)
Alkaline Phosphatase: 89 U/L (ref 39–117)
BUN: 10 mg/dL (ref 6–23)
CO2: 27 mEq/L (ref 19–32)
Calcium: 9.4 mg/dL (ref 8.4–10.5)
Chloride: 103 mEq/L (ref 96–112)
Creatinine, Ser: 0.74 mg/dL (ref 0.50–1.10)
GFR calc Af Amer: >90 mL/min (ref >90)
GFR calc non Af Amer: >90 mL/min (ref >90)
Glucose, Bld: 98 mg/dL (ref 70–99)
Potassium: 3.8 mEq/L (ref 3.5–5.1)
Sodium: 140 mEq/L (ref 135–145)
Total Bilirubin: 0.1 mg/dL — ABNORMAL LOW (ref 0.3–1.2)
Total Protein: 7.6 g/dL (ref 6.0–8.3)

## 2011-09-25 LAB — DIFFERENTIAL
Basophils Absolute: 0 10*3/uL (ref 0.0–0.1)
Basophils Relative: 0 % (ref 0–1)
Eosinophils Absolute: 0.2 10*3/uL (ref 0.0–0.7)
Eosinophils Relative: 2 % (ref 0–5)
Lymphocytes Relative: 43 % (ref 12–46)
Lymphs Abs: 5.1 10*3/uL — ABNORMAL HIGH (ref 0.7–4.0)
Monocytes Absolute: 1 10*3/uL (ref 0.1–1.0)
Monocytes Relative: 9 % (ref 3–12)
Neutro Abs: 5.4 10*3/uL (ref 1.7–7.7)
Neutrophils Relative %: 46 % (ref 43–77)

## 2011-09-25 LAB — POCT PREGNANCY, URINE: Preg Test, Ur: NEGATIVE

## 2011-09-25 MED ORDER — KETOROLAC TROMETHAMINE 30 MG/ML IJ SOLN
30.0000 mg | Freq: Once | INTRAMUSCULAR | Status: AC
  Administered 2011-09-25: 30 mg via INTRAVENOUS

## 2011-09-25 MED ORDER — SODIUM CHLORIDE 0.9 % IV SOLN: 1000.0000 mL | INTRAVENOUS | Status: DC

## 2011-09-25 MED ORDER — KETOROLAC TROMETHAMINE 30 MG/ML IJ SOLN
INTRAMUSCULAR | Status: AC
  Filled 2011-09-25: qty 1

## 2011-09-25 MED ORDER — SODIUM CHLORIDE 0.9 % IV SOLN
1000.0000 mL | Freq: Once | INTRAVENOUS | Status: AC
  Administered 2011-09-25: 1000 mL via INTRAVENOUS

## 2011-09-25 MED ORDER — HYDROMORPHONE HCL PF 1 MG/ML IJ SOLN
1.0000 mg | Freq: Once | INTRAMUSCULAR | Status: AC
  Administered 2011-09-25: 1 mg via INTRAVENOUS
  Filled 2011-09-25: qty 1

## 2011-09-25 MED ORDER — ONDANSETRON HCL 4 MG/2ML IJ SOLN
4.0000 mg | Freq: Once | INTRAMUSCULAR | Status: AC
  Administered 2011-09-25: 4 mg via INTRAVENOUS
  Filled 2011-09-25: qty 2

## 2011-09-25 MED ORDER — OXYCODONE-ACETAMINOPHEN 5-325 MG PO TABS: 1.0000 | ORAL_TABLET | ORAL | Status: DC | PRN

## 2011-09-25 NOTE — ED Notes (Signed)
Pt states she was dx with a kidney stone over the weekend.  Pt states pain has not improved and pcp instructed pt to come to ED if pain did not improve.

## 2011-09-25 NOTE — ED Notes (Signed)
Patient undressed and in a gown. Pulse oximetry and blood pressure cuff on.

## 2011-09-25 NOTE — ED Notes (Signed)
Pt returned from restroom with assistance

## 2011-09-25 NOTE — ED Provider Notes (Signed)
History     CSN: [id number]  Arrival date & time 09/25/11  1235   First MD Initiated Contact with Patient 09/25/11 1709      Chief Complaint  Patient presents with  . Abdominal Pain    (Consider location/radiation/quality/duration/timing/severity/associated sxs/prior treatment) HPI Comments: Pt has been seen a few times in the past.  She has had a CT scan as well as an ultrasound.  The CT scan did not show appendicitis but it did suggest a possible kidney infection.  Pt feels like it was getting worse so she came to the ED today.  Pt called her doctor and was told to come to the ED.  Pt has been on antibiotics but it is not getting better.  The pain is on both sides now but more on the left.  It increases with eating.  Her pain meds do not help.  Patient is a 21 y.o. female presenting with abdominal pain. The history is provided by the patient.  Abdominal Pain The primary symptoms of the illness include abdominal pain. Episode onset: 1 month ago. The onset of the illness was gradual.    Past Medical History  Diagnosis Date  . SVD (spontaneous vaginal delivery)   . Tobacco abuse   . Bipolar I disorder   . PTSD (post-traumatic stress disorder)   . Abnormal Pap smear     History reviewed. No pertinent past surgical history.  No family history on file.  History  Substance Use Topics  . Smoking status: Current Everyday Smoker -- 0.8 packs/day    Types: Cigarettes  . Smokeless tobacco: Not on file   Comment: decreased smoking   . Alcohol Use: No    OB History    Grav Para Term Preterm Abortions TAB SAB Ect Mult Living                  Review of Systems  Gastrointestinal: Positive for abdominal pain.  All other systems reviewed and are negative.    Allergies  Codeine  Home Medications   Current Outpatient Rx  Name Route Sig Dispense Refill  . CEPHALEXIN 500 MG PO CAPS Oral Take 500 mg by mouth 3 (three) times daily. Suppose to finish course today.    .  OXYCODONE-ACETAMINOPHEN 5-325 MG PO TABS Oral Take 1 tablet by mouth every 4 (four) hours as needed. pain      BP 128/69  Pulse 98  Temp(Src) 97.9 F (36.6 C) (Oral)  Resp 16  SpO2 99%  LMP 09/01/2011  Physical Exam  Nursing note and vitals reviewed. Constitutional: She appears well-developed and well-nourished. No distress.  HENT:  Head: Normocephalic and atraumatic.  Right Ear: External ear normal.  Left Ear: External ear normal.  Eyes: Conjunctivae are normal. Right eye exhibits no discharge. Left eye exhibits no discharge. No scleral icterus.  Neck: Neck supple. No tracheal deviation present.  Cardiovascular: Normal rate, regular rhythm and intact distal pulses.   Pulmonary/Chest: Effort normal and breath sounds normal. No stridor. No respiratory distress. She has no wheezes. She has no rales.  Abdominal: Soft. Bowel sounds are normal. She exhibits no distension. There is tenderness in the right lower quadrant. There is no rigidity, no rebound and no guarding.  Musculoskeletal: She exhibits no edema and no tenderness.  Neurological: She is alert. She has normal strength. No sensory deficit. Cranial nerve deficit:  no gross defecits noted. She exhibits normal muscle tone. She displays no seizure activity. Coordination normal.  Skin: Skin is warm  and dry. No rash noted.  Psychiatric: She has a normal mood and affect.    ED Course  Procedures (including critical care time)  Labs Reviewed  CBC - Abnormal; Notable for the following:    WBC 11.8 (*)    All other components within normal limits  DIFFERENTIAL - Abnormal; Notable for the following:    Lymphs Abs 5.1 (*)    All other components within normal limits  COMPREHENSIVE METABOLIC PANEL - Abnormal; Notable for the following:    Total Bilirubin 0.1 (*)    All other components within normal limits  URINALYSIS, ROUTINE W REFLEX MICROSCOPIC  POCT PREGNANCY, URINE   Ct Abdomen Pelvis Wo Contrast  09/25/2011  *RADIOLOGY  REPORT*  Clinical Data: Abdominal pain for 4 weeks.  Urinary tract infection.  Pain.  CT ABDOMEN AND PELVIS WITHOUT CONTRAST  Technique:  Multidetector CT imaging of the abdomen and pelvis was performed following the standard protocol without intravenous contrast.  Comparison: 09/09/2011  Findings: Lung bases are unremarkable.  There are changes of early medullary nephrocalcinosis, right greater than left.  No evidence for discrete renal or ureteral calculi.  The changes within the renal parenchyma on the previous exams, suggesting pyelonephritis are not apparent today but would not be expected to be identified given the lack of intravenous contrast.  No focal abnormality identified within the liver, spleen, pancreas, or adrenal glands.  The gallbladder is present.  The stomach and small bowel loops have a normal appearance. The appendix is well seen and has a normal appearance.  Colonic loops are normal in appearance.  The uterus is present and contains intrauterine device in the expected location. No retroperitoneal or mesenteric adenopathy. No evidence for aortic aneurysm. Visualized osseous structures have a normal appearance.  IMPRESSION:  1.  Changes of medullary nephrocalcinosis, right greater than left. 2.  No intrarenal calculi or urinary tract obstruction. 3.  No evidence for acute appendicitis.  Original Report Authenticated By: Patterson Hammersmith, M.D.       MDM  CT scan suggests medullary nephrocalcinosis. At this time she has no evidence of intrarenal calculi or urinary tract obstruction there is no signs of acute appendicitis.  Her urinalysis does not suggest a urinary tract infection. As part of this workup the patient has already had a pelvic exam and pelvic ultrasound were unremarkable. This point it does not appear to be evidence of an acute emergency medical condition. Will have her followup with her Dr. consider seeing a nephrologist considering her symptoms.        Celene Kras,  MD 09/25/11 [phone]

## 2011-09-25 NOTE — ED Notes (Signed)
Pt sitting and interacting with visitor in waiting room. Appears in nad.

## 2011-09-25 NOTE — ED Notes (Addendum)
Pt ambulatory to restroom with assistance.

## 2011-09-25 NOTE — ED Notes (Signed)
Family at bedside. 

## 2011-09-25 NOTE — ED Notes (Signed)
abd pain x 4 weeks dx w/ uti has nas seen  a dr here and her dr and given 2 different meds still having pain states took 1 percocet this am still in pain

## 2011-09-25 NOTE — Assessment & Plan Note (Signed)
Has changed in location since last evaluation.  Pt reports pain is 7/10 in LLQ.  Further workup needed in the setting of these changing symptoms.  Will repeat blood work- cmet, cbc, lipase, u/a.  Will also repeat abd and pelvic ultrasound to look for any changes, and also to reevaluation kidneys. Pt to return on Monday or Tuesday for reeval.  Gave percocet for pain control.  If new or worsening of symptoms over the weekend pt is to go to ER for evaluation.

## 2011-09-26 ENCOUNTER — Other Ambulatory Visit (HOSPITAL_COMMUNITY)
Admission: RE | Admit: 2011-09-26 | Discharge: 2011-09-26 | Disposition: A | Discharge status: Home / Self Care | Payer: Medicaid Other | Source: Ambulatory Visit | Attending: Family Medicine | Admitting: Family Medicine

## 2011-09-26 ENCOUNTER — Ambulatory Visit (INDEPENDENT_AMBULATORY_CARE_PROVIDER_SITE_OTHER): Payer: Medicaid Other | Admitting: Family Medicine

## 2011-09-26 ENCOUNTER — Encounter: Payer: Self-pay | Admitting: Family Medicine

## 2011-09-26 DIAGNOSIS — Z113 Encounter for screening for infections with a predominantly sexual mode of transmission: Secondary | ICD-10-CM | POA: Insufficient documentation

## 2011-09-26 DIAGNOSIS — R109 Unspecified abdominal pain: Secondary | ICD-10-CM

## 2011-09-26 LAB — POCT URINALYSIS DIPSTICK
Bilirubin, UA: NEGATIVE
Blood, UA: NEGATIVE
Glucose, UA: NEGATIVE
Ketones, UA: NEGATIVE
Leukocytes, UA: NEGATIVE
Nitrite, UA: NEGATIVE
Protein, UA: NEGATIVE
Spec Grav, UA: 1.01
Urobilinogen, UA: 0.2
pH, UA: 7

## 2011-09-26 MED ORDER — CIPROFLOXACIN HCL 500 MG PO TABS: 500.0000 mg | ORAL_TABLET | Freq: Two times a day (BID) | ORAL | Status: DC

## 2011-09-26 MED ORDER — OXYCODONE-ACETAMINOPHEN 5-325 MG PO TABS: 1.0000 | ORAL_TABLET | ORAL | Status: DC | PRN

## 2011-09-26 MED ORDER — METRONIDAZOLE 500 MG PO TABS: 500.0000 mg | ORAL_TABLET | Freq: Two times a day (BID) | ORAL | Status: DC

## 2011-09-26 MED ORDER — AZITHROMYCIN 500 MG PO TABS: 500.0000 mg | ORAL_TABLET | Freq: Every day | ORAL | Status: DC

## 2011-09-27 ENCOUNTER — Encounter: Payer: Self-pay | Admitting: Family Medicine

## 2011-09-27 ENCOUNTER — Telehealth: Payer: Self-pay | Admitting: Family Medicine

## 2011-09-27 ENCOUNTER — Ambulatory Visit (INDEPENDENT_AMBULATORY_CARE_PROVIDER_SITE_OTHER): Payer: Medicaid Other | Admitting: Family Medicine

## 2011-09-27 VITALS — BP 121/76 | HR 102 | Temp 98.4°F | Wt 139.0 lb

## 2011-09-27 DIAGNOSIS — R55 Syncope and collapse: Secondary | ICD-10-CM

## 2011-09-27 DIAGNOSIS — R112 Nausea with vomiting, unspecified: Secondary | ICD-10-CM

## 2011-09-27 DIAGNOSIS — R109 Unspecified abdominal pain: Secondary | ICD-10-CM

## 2011-09-27 MED ORDER — PROMETHAZINE HCL 25 MG PO TABS
25.0000 mg | ORAL_TABLET | Freq: Three times a day (TID) | ORAL | Status: DC | PRN
Start: 2011-09-27 — End: 2011-12-03

## 2011-09-27 NOTE — Progress Notes (Signed)
  Subjective:    Patient ID: Michelle [name], female    DOB: 03-13-1991, 21 y.o.   MRN: [id number]  HPI Vomiting last night and persistent abdominal pain: Patient reports that after coming to office yesterday she went home and had a steak with a 1 soft and salad for supper. After eating she had a dark-colored vomit, she was concerned that this was blood. After that she vomited 3 other times during the night had dark brown colored specks in it. One time she was walking to bathroom and was dry heaving when she passed out. Woke up she felt a few minutes later on the floor. No head trauma. No dizziness after this. No weakness in extremities. Continues to have abdominal pain consistent with yesterday. Also having some discomfort in epigastric area today. Percocet did not seem to be controlling pain. No fever. No back pain. No urine symptoms. No diarrhea. No other sick contacts in the house. Patient has not started taking antibiotics as prescribed at yesterday's appointment for PID.  Husband states he did know she needed home, also concerned about inability to afford. Patient has had no further vomiting episodes today. Abdominal pain consistent with yesterday's.    Review of Systems As per above.    Objective:   Physical Exam  Constitutional: She is oriented to person, place, and time. She appears well-developed and well-nourished.  HENT:  Head: Normocephalic and atraumatic.  Mouth/Throat: Oropharynx is clear and moist.  Eyes: EOM are normal. Pupils are equal, round, and reactive to light. Right eye exhibits no discharge. Left eye exhibits no discharge.  Neck: Neck supple. No thyromegaly present.  Cardiovascular: Normal rate, regular rhythm and normal heart sounds.   No murmur heard. Pulmonary/Chest: Effort normal. No respiratory distress. She has no wheezes. She has no rales.  Abdominal: Soft. She exhibits no distension and no mass. There is tenderness (in RLQ and LLQ, moderate to severe--- in  epigastric area + mild tenderness). There is no rebound and no guarding.  Musculoskeletal: She exhibits no edema.  Lymphadenopathy:    She has no cervical adenopathy.  Neurological: She is alert and oriented to person, place, and time. She has normal reflexes. She displays normal reflexes. No cranial nerve deficit. She exhibits normal muscle tone. Coordination normal.  Skin: No rash noted.  Psychiatric: She has a normal mood and affect.          Assessment & Plan:
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
## 2011-09-27 NOTE — Telephone Encounter (Signed)
Patient called about bloody emesis. This is new problem, being worked up for abdominal pain. Advised needs evaluation. Patient states she is working to find a babysitter so that she can come to ED.

## 2011-09-27 NOTE — Patient Instructions (Signed)
Abdominal pain: Stop the percocet- this is probably contributing to your nausea. Eat bland diet- Drink lots of fluids. Monitor vomiting and look for dark color Go purchase antibiotic today- remember there are 3 of them.  I will give you a prescription for phenergan in case you need it.  Return to see me in the morning.

## 2011-09-28 ENCOUNTER — Ambulatory Visit (INDEPENDENT_AMBULATORY_CARE_PROVIDER_SITE_OTHER): Payer: Medicaid Other | Admitting: Family Medicine

## 2011-09-28 ENCOUNTER — Encounter: Payer: Self-pay | Admitting: Family Medicine

## 2011-09-28 DIAGNOSIS — R109 Unspecified abdominal pain: Secondary | ICD-10-CM

## 2011-09-28 NOTE — Patient Instructions (Signed)
Return on Tuesday for follow up.  Return sooner if any new or worsening of symptoms.  Continue antibiotics. Still unclear if this is all due to pyelonephritis or PID.       Pyelonephritis, Adult Pyelonephritis is a kidney infection. In general, there are 2 main types of pyelonephritis:  Infections that come on quickly without any warning (acute pyelonephritis).   Infections that persist for a long period of time (chronic pyelonephritis).  CAUSES  Two main causes of pyelonephritis are:  Bacteria traveling from the bladder to the kidney. This is a problem especially in pregnant women. The urine in the bladder can become filled with bacteria from multiple causes, including:   Inflammation of the prostate gland (prostatitis).   Sexual intercourse in females.   Bladder infection (cystitis).   Bacteria traveling from the bloodstream to the tissue part of the kidney.  Problems that may increase your risk of getting a kidney infection include:  Diabetes.   Kidney stones or bladder stones.   Cancer.   Catheters placed in the bladder.   Other abnormalities of the kidney or ureter.  SYMPTOMS   Abdominal pain.   Pain in the side or flank area.   Fever.   Chills.   Upset stomach.   Blood in the urine (dark urine).   Frequent urination.   Strong or persistent urge to urinate.   Burning or stinging when urinating.  DIAGNOSIS  Your caregiver may diagnose your kidney infection based on your symptoms. A urine sample may also be taken. TREATMENT  In general, treatment depends on how severe the infection is.   If the infection is mild and caught early, your caregiver may treat you with oral antibiotics and send you home.   If the infection is more severe, the bacteria may have gotten into the bloodstream. This will require intravenous (IV) antibiotics and a hospital stay. Symptoms may include:   High fever.   Severe flank pain.   Shaking chills.   Even after a  hospital stay, your caregiver may require you to be on oral antibiotics for a period of time.   Other treatments may be required depending upon the cause of the infection.  HOME CARE INSTRUCTIONS   Take your antibiotics as directed. Finish them even if you start to feel better.   Make an appointment to have your urine checked to make sure the infection is gone.   Drink enough fluids to keep your urine clear or pale yellow.   Take medicines for the bladder if you have urgency and frequency of urination as directed by your caregiver.  SEEK IMMEDIATE MEDICAL CARE IF:   You have a fever.   You are unable to take your antibiotics or fluids.   You develop shaking chills.   You experience extreme weakness or fainting.   There is no improvement after 2 days of treatment.  MAKE SURE YOU:  Understand these instructions.   Will watch your condition.   Will get help right away if you are not doing well or get worse.  Document Released: 07/03/2005 Document Revised: 06/22/2011 Document Reviewed: 12/07/2010 [facility] Patient Information 2012 [place], [place].      Pelvic Inflammatory Disease Pelvic Inflammatory Disease (PID) is an infection in some or all of your female organs. This includes the womb (uterus), ovaries, fallopian tubes and tissues in the pelvis. PID is a common cause of sudden onset (acute) lower abdominal (pelvic) pain. PID can be treated, but it is a serious infection. It  may take weeks before you are completely well. In some cases, hospitalization is needed for surgery or to administer medications to kill germs (antibiotics) through your veins (intravenously). CAUSES   It may be caused by germs that are spread during sexual contact.   PID can also occur following:   The birth of a baby.   A miscarriage.   An abortion.   Major surgery of the pelvis.   Use of an IUD.   Sexual assault.  SYMPTOMS   Abdominal or pelvic pain.   Fever.   Chills.   Abnormal  vaginal discharge.  DIAGNOSIS  Your caregiver will choose some of these methods to make a diagnosis:  A physical exam and history.   Blood tests.   Cultures of the vagina and cervix.   X-rays or ultrasound.   A procedure to look inside the pelvis (laparoscopy).  TREATMENT   Use of antibiotics by mouth or intravenously.   Treatment of sexual partners when the infection is an sexually transmitted disease (STD).   Hospitalization and surgery may be needed.  RISKS AND COMPLICATIONS   PID can cause women to become unable to have children (sterile) if left untreated or if partially treated. That is why it is important to finish all medications given to you.   Sterility or future tubal (ectopic) pregnancies can occur in fully treated individuals. This is why it is so important to follow your prescribed treatment.   It can cause longstanding (chronic) pelvic pain after frequent infections.   Painful intercourse.   Pelvic abscesses.   In rare cases, surgery or a hysterectomy may be needed.   If this is a sexually transmitted infection (STI), you are also at risk for any other STD including AIDSor human papillomavirus (HPV).  HOME CARE INSTRUCTIONS   Finish all medication as prescribed. Incomplete treatment will put you at risk for sterility and tubal pregnancy.   Only take over-the-counter or prescription medicines for pain, discomfort, or fever as directed by your caregiver.   Do not have sex until treatment is completed or as directed by your caregiver. If PID is confirmed, your recent sexual contacts will need treatment.   Keep your follow-up appointments.  SEEK MEDICAL CARE IF:   You have increased or abnormal vaginal discharge.   You need prescription medication for your pain.   Your partner has an STD.   You are vomiting.   You cannot take your medications.  SEEK IMMEDIATE MEDICAL CARE IF:   You have a fever.   You develop increased abdominal or pelvic pain.     You develop chills.   You have pain when you urinate.   You are not better after 72 hours following treatment.  Document Released: 07/03/2005 Document Revised: 06/22/2011 Document Reviewed: 03/16/2007 [facility] Patient Information 2012 [place], [place].

## 2011-09-28 NOTE — Progress Notes (Signed)
  Subjective:    Patient ID: Michelle [name], female    DOB: [birth year], 21 y.o.   MRN: [id number]  HPI Abdominal pain followup: Patient states that she has had some slight improvement in lower abdominal pain. Yesterday his pain was 8/10. Today it is 7/10. Patient was able to eat a full meal-chicken, rice, broccoli last night for supper. She had one episode of vomiting at 11:30 a positive one hour after the antibiotics. She did not have any blood in the vomit. No further syncopal episodes. Has had some intermittent nausea. Felt well enough last night to clean her house. States she has taken antibiotics as prescribed since yesterday when she left her appointment with me. Patient states she does not want to come back tomorrow for followup and she is feeling better. But agrees to followup with me next week. No fever. No back pain. No nausea. No further vomiting.   Review of Systems    as per above. Objective:   Physical Exam  Constitutional: She appears well-developed and well-nourished. No distress.  HENT:  Head: Normocephalic and atraumatic.  Neck: Neck supple.  Cardiovascular: Normal rate, regular rhythm and normal heart sounds.   No murmur heard. Pulmonary/Chest: Effort normal. No respiratory distress. She has no wheezes. She has no rales. She exhibits no tenderness.  Abdominal: Soft. Bowel sounds are normal. She exhibits no distension and no mass. There is tenderness (mild to mod LLQ and RLQ abd pain, mild epigastric tenderness--- improved compared to yesterdays exam.). There is no rebound and no guarding.  Musculoskeletal: She exhibits no edema.  Lymphadenopathy:    She has no cervical adenopathy.  Neurological: She is alert.  Skin: No rash noted.          Assessment & Plan:

## 2011-09-29 ENCOUNTER — Ambulatory Visit: Payer: Medicaid Other | Admitting: Family Medicine

## 2011-09-29 LAB — URINE CULTURE: Colony Count: >=100000

## 2011-10-01 DIAGNOSIS — R112 Nausea with vomiting, unspecified: Secondary | ICD-10-CM | POA: Insufficient documentation

## 2011-10-01 DIAGNOSIS — R55 Syncope and collapse: Secondary | ICD-10-CM | POA: Insufficient documentation

## 2011-10-01 NOTE — Assessment & Plan Note (Signed)
Not improved.  Pt has not yet taken first dose of antibiotics.  Reviewed with pt and fiancee the importance of taking antibiotics to treat the possible PID infection.  Advised that she pick up the antibiotics immediately and begin to take as directed.

## 2011-10-01 NOTE — Assessment & Plan Note (Addendum)
Most likely the n/v due to narcotic pain medications and possible PID infection.  The dark color of vomit most likely is due to the color of the food she at for dinner prior to the episodes of vomiting. No vomiting occurred while at office or during exam.  MMM, no dizziness with standing, no orthostasis.  Vomiting now resolved.  Reviewed red flags for return.  Phenergan for n/v. Pt told to stop taking percocet since it may be causing this.

## 2011-10-01 NOTE — Assessment & Plan Note (Signed)
Improved abd pain, no fever, over all feels better-- + urine culture for enterobacter sensitive to cipro.  Will continue antibiotics.  Most likely symptoms were due to either continue pyelo or PID.  Will continue antibiotic regimen of cipro, flagyl, and azithromycin since improved and high suspicion/concern for PID from previous exam 2 days ago. No more narcotics- since they complicate picture by possibly causing nausea/vomiting. Pt to return for follow up on Tuesday next week.  Reviewed red flags that require patient return to care.

## 2011-10-01 NOTE — Assessment & Plan Note (Signed)
Most likely 2/2 vasovagal since this occurred while vomiting.  Neuro exam wnl.  No further episodes. No seizure activity.  No loss of bowel or bladder during episode. Continue to monitor closely for any further events.

## 2011-10-01 NOTE — Assessment & Plan Note (Signed)
Pt continues to have persistent pain.  Done with keflex antibiotics for pyelo.  Workup in office and in ER negative.  Repeat pelvic exam today revealed CMT and tenderness in adenxa.  Persistent tenderness in RLQ and LLQ.  Will recheck UA and urine culture.  Repeated GC/Chlam.  Will also treat pt for PID in the setting of above PE findings- cipro, azithromycin, and flagyl.  Pt to return tomorrow for recheck.  Reviewed red flags that would warrant return to ER.

## 2011-10-03 ENCOUNTER — Encounter: Payer: Self-pay | Admitting: Family Medicine

## 2011-10-03 ENCOUNTER — Ambulatory Visit (INDEPENDENT_AMBULATORY_CARE_PROVIDER_SITE_OTHER): Payer: Medicaid Other | Admitting: Family Medicine

## 2011-10-03 DIAGNOSIS — R109 Unspecified abdominal pain: Secondary | ICD-10-CM

## 2011-10-03 NOTE — Assessment & Plan Note (Signed)
Abdominal pain improving by history.  Also, on exam abd pain is improved.  Pt to complete antibiotics.  Pt to return in 3-4 days after completion of antibiotics for recheck.  If persistent problems/symptoms at that time can consider recheck of Urine culture to confirm that infection is resolved, recheck of renal u/s to r/o abscess (if symptoms continue especially if any fever), could also consider referral to GI if continued abd pain, n/v. Hopefully pt will continue to improve and no further work up will be needed.  At this point, with pt improving it appears that diagnosis of pyelonephritis vs PID is most likely.

## 2011-10-03 NOTE — Progress Notes (Signed)
  Subjective:    Patient ID: Michelle [name], female    DOB: 1991-06-09, 21 y.o.   MRN: [id number]  HPI Abdominal pain Followup: Diagnosed last week with pyelonephritis versus PID. Patient states that day after last visit pain in abdomen we completely away. Over the weekend pain did come back intermittently. Would occur 2-3 times per day. The last approximately 2 hours. Occasionally would have nausea. Had 3 episodes of vomiting over the weekend. No further syncope. Patient states that eating seems to make the pain worse. It seems like it improves with rest and lying down. No burning with urination. Positive urinary frequency, positive urinary tension off and on. No fever. No back pain. Has been taking antibiotics as directed. States she has 3 more days of Cipro and Flagyl to take. Eating well. Drinking well. States that overall she feels much better than she did last week. Seems to be improving every day. Continues to deny vaginal discharge.  Review of Systems As per above.    Objective:   Physical Exam  Constitutional: She appears well-developed and well-nourished.  HENT:  Head: Normocephalic and atraumatic.  Mouth/Throat: Oropharynx is clear and moist. No oropharyngeal exudate.  Eyes: Pupils are equal, round, and reactive to light. Right eye exhibits no discharge. Left eye exhibits no discharge.  Cardiovascular: Normal rate, regular rhythm and normal heart sounds.   No murmur heard. Pulmonary/Chest: Effort normal and breath sounds normal. No respiratory distress. She has no wheezes. She has no rales.  Abdominal: Soft. She exhibits no distension and no mass. There is tenderness (mild tenderness in epigastric area and lower abd- improved compared to last exam.). There is no rebound and no guarding.  Musculoskeletal: She exhibits no edema.  Neurological: She is alert.  Skin: Skin is warm. No rash noted.  Psychiatric: She has a normal mood and affect.       Smiling, appears to feel better.            Assessment & Plan:

## 2011-10-03 NOTE — Patient Instructions (Signed)
Continue to take the antibiotics.  I still think that is is either due to pyelonephritis or PID.   Return to see me for recheck in 3-4 days.   If new or worsening symptoms return sooner.

## 2011-10-06 ENCOUNTER — Ambulatory Visit (INDEPENDENT_AMBULATORY_CARE_PROVIDER_SITE_OTHER): Payer: Medicaid Other | Admitting: Family Medicine

## 2011-10-06 ENCOUNTER — Encounter: Payer: Self-pay | Admitting: Family Medicine

## 2011-10-06 DIAGNOSIS — R109 Unspecified abdominal pain: Secondary | ICD-10-CM

## 2011-10-06 LAB — POCT URINALYSIS DIPSTICK
Bilirubin, UA: NEGATIVE
Blood, UA: NEGATIVE
Glucose, UA: NEGATIVE
Ketones, UA: NEGATIVE
Leukocytes, UA: NEGATIVE
Nitrite, UA: NEGATIVE
Protein, UA: 30
Spec Grav, UA: 1.015
Urobilinogen, UA: 0.2
pH, UA: 7

## 2011-10-06 LAB — POCT UA - MICROSCOPIC ONLY

## 2011-10-06 MED ORDER — RANITIDINE HCL 150 MG PO CAPS: 150.0000 mg | ORAL_CAPSULE | Freq: Two times a day (BID) | ORAL | Status: DC

## 2011-10-06 NOTE — Patient Instructions (Addendum)
We will [facility] and for [facility] doctor.   Take ranitidine 2 x day daily.  Try taking tums to see if this helps settle your stomach.   I will mail you or call you with your urine results.   Return to see me within the next week.

## 2011-10-06 NOTE — Progress Notes (Signed)
  Subjective:    Patient ID: Michelle [name], female    DOB: [birth year], 21 y.o.   MRN: [id number]  HPI Abdominal pain followup: Patient reports continued transient abdominal pain off and on. Pain located mostly in lower abdominal area. Occasional have epigastric discomfort but this is mild in comparison. Will come on and last for 2 hours and then goal weight for short amount of time. Occurs about 6 times per day. Food makes it worse. Lying down makes it better. Has had positive nausea with pain. Last night had vomiting with blood in it x1. No back pain. No vaginal discharge. No burning with urination. No urinary frequency. Finished antibiotics yesterday. No bloody stools. No dark stools.    Review of Systems    as per above. Objective:   Physical Exam  Constitutional: She appears well-developed and well-nourished.  HENT:  Head: Normocephalic and atraumatic.  Nose: Nose normal.  Mouth/Throat: Oropharynx is clear and moist. No oropharyngeal exudate.  Eyes: Pupils are equal, round, and reactive to light.  Neck: Normal range of motion. Neck supple.  Cardiovascular: Normal rate, regular rhythm and normal heart sounds.   No murmur heard. Pulmonary/Chest: Effort normal and breath sounds normal. No respiratory distress. She has no wheezes. She has no rales.  Abdominal: Soft. She exhibits no distension. There is tenderness (moderate tenderness in RLQ and LLQ, mild tenderness in epigastric area with palpation). There is no rebound and no guarding.  Musculoskeletal: She exhibits no edema.       No CVA tenderness  Neurological: She is alert.  Skin: No rash noted.  Psychiatric: She has a normal mood and affect. Her behavior is normal.          Assessment & Plan:

## 2011-10-07 NOTE — Assessment & Plan Note (Signed)
abominal pain- improved but is still present off and on.  Last dose of antibiotic taken yesterday.  Will repeat the U/a and culture for test of cure.  [facility]- I don't think that this is causing this discomfort but in the setting of ? Of PID that is improved but now is continuing to have abdominal pain- I think that it may be best to get the IUD out at this time.  Also will refer to GI for their opinion regarding patient symptoms.

## 2011-10-08 LAB — URINE CULTURE
Colony Count: NO GROWTH
Organism ID, Bacteria: NO GROWTH

## 2011-10-09 ENCOUNTER — Telehealth: Payer: Self-pay | Admitting: *Deleted

## 2011-10-09 ENCOUNTER — Other Ambulatory Visit: Payer: Self-pay | Admitting: Family Medicine

## 2011-10-09 DIAGNOSIS — T839XXA Unspecified complication of genitourinary prosthetic device, implant and graft, initial encounter: Secondary | ICD-10-CM

## 2011-10-09 NOTE — Telephone Encounter (Signed)
Will send request for IUD removal to [facility]. Pt informed. Lorenda Hatchet, Renato Battles

## 2011-10-09 NOTE — Telephone Encounter (Signed)
Waiting for call back. Need to ask about appt for IUD removal.  .Arlyss Repress

## 2011-10-10 ENCOUNTER — Encounter: Payer: Self-pay | Admitting: Advanced Practice Midwife

## 2011-10-11 ENCOUNTER — Ambulatory Visit (INDEPENDENT_AMBULATORY_CARE_PROVIDER_SITE_OTHER): Payer: Medicaid Other | Admitting: Gastroenterology

## 2011-10-11 ENCOUNTER — Encounter: Payer: Self-pay | Admitting: Gastroenterology

## 2011-10-11 DIAGNOSIS — R109 Unspecified abdominal pain: Secondary | ICD-10-CM

## 2011-10-11 NOTE — Assessment & Plan Note (Addendum)
One month history of lower bowel pain with fever, intermittent hematemesis and negative CT and ultrasound except for nephrocalcinosis. I strongly suspect that her abdominal pain is related to a primary GYN process, perhaps PID. Upper GI symptoms of nausea and vomiting seem to be secondary to her pain. There is no evidence for diverticulitis or appendicitis. She describes very limited hematemesis which probably is secondary to recurrent vomiting.  Recommendations #1 hold GI workup until her IUD is removed. If abdominal pain not improve I would consider further evaluation by endoscopic studies

## 2011-10-11 NOTE — Progress Notes (Signed)
History of Present Illness: Michelle [name] is a 21 year old white female referred at the request of Dr. Edmonia James for evaluation of abdominal pain. In the past month she's been complaining of moderately severe lower abdominal pain.  Pain is described as severe, constant, similar to pain from uterine contractions, and unrelated to eating or bowel movements. With pain she has developed nausea and intermittent vomiting. She claims to have seen  small blood clots with her vomiting. She denies melena or hematochezia. She's undergone extensive workup including CT x2, ultrasound x2, vaginal ultrasound and was hospitalized for  Pain and fever. She was treated with various courses of antibiotics for pyelonephritis and possible PID. She has no prior GI history.    Past Medical History  Diagnosis Date  . Tobacco abuse   . Bipolar I disorder   . PTSD (post-traumatic stress disorder)   . DM mellitus, gestational   . Post partum depression    History reviewed. No pertinent past surgical history. family history includes Breast cancer in her paternal grandmother; Colon cancer in her maternal grandfather and paternal grandfather; Colon polyps in her maternal aunt; Diabetes in her father, mother, and sister; and Heart disease in her father. No current outpatient prescriptions on file.   Allergies as of 10/11/2011 - Review Complete 10/11/2011  Allergen Reaction Noted  . Codeine Anaphylaxis and Hives 06/10/2008    reports that she has been smoking Cigarettes.  She has been smoking about .8 packs per day. She has never used smokeless tobacco. She reports that she does not drink alcohol or use illicit drugs.     Review of Systems: Pertinent positive and negative review of systems were noted in the above HPI section. All other review of systems were otherwise negative.  Vital signs were reviewed in today's medical record Physical Exam: General: She is a slightly ill-appearing female in no acute distress Head:  Normocephalic and atraumatic Eyes:  sclerae anicteric, EOMI Ears: Normal auditory acuity Mouth: No deformity or lesions Neck: Supple, no masses or thyromegaly Lungs: Clear throughout to auscultation Heart: Regular rate and rhythm; no murmurs, rubs or bruits Abdomen: Soft,and non distended. No masses, hepatosplenomegaly or hernias noted. Normal Bowel sounds; there is mild bilateral lower quadrant tenderness to palpation without guarding or rebound Rectal:deferred Musculoskeletal: Symmetrical with no gross deformities  Skin: No lesions on visible extremities Pulses:  Normal pulses noted Extremities: No clubbing, cyanosis, edema or deformities noted Neurological: Alert oriented x 4, grossly nonfocal Cervical Nodes:  No significant cervical adenopathy Inguinal Nodes: No significant inguinal adenopathy Psychological:  Alert and cooperative. Normal mood and affect

## 2011-10-11 NOTE — Patient Instructions (Signed)
Follow up as needed

## 2011-10-12 ENCOUNTER — Telehealth: Payer: Self-pay | Admitting: Family Medicine

## 2011-10-12 ENCOUNTER — Encounter (HOSPITAL_COMMUNITY): Payer: Self-pay | Admitting: *Deleted

## 2011-10-12 ENCOUNTER — Encounter: Payer: Self-pay | Admitting: Family Medicine

## 2011-10-12 ENCOUNTER — Inpatient Hospital Stay (HOSPITAL_COMMUNITY)
Admission: AD | Admit: 2011-10-12 | Discharge: 2011-10-13 | Disposition: A | Discharge status: Home / Self Care | Payer: Medicaid Other | Source: Ambulatory Visit | Attending: Family Medicine | Admitting: Family Medicine

## 2011-10-12 ENCOUNTER — Inpatient Hospital Stay (HOSPITAL_COMMUNITY): Payer: Medicaid Other

## 2011-10-12 ENCOUNTER — Ambulatory Visit (INDEPENDENT_AMBULATORY_CARE_PROVIDER_SITE_OTHER): Payer: Medicaid Other | Admitting: Family Medicine

## 2011-10-12 DIAGNOSIS — N949 Unspecified condition associated with female genital organs and menstrual cycle: Secondary | ICD-10-CM | POA: Insufficient documentation

## 2011-10-12 DIAGNOSIS — T839XXA Unspecified complication of genitourinary prosthetic device, implant and graft, initial encounter: Secondary | ICD-10-CM

## 2011-10-12 DIAGNOSIS — Z30431 Encounter for routine checking of intrauterine contraceptive device: Secondary | ICD-10-CM | POA: Insufficient documentation

## 2011-10-12 DIAGNOSIS — R102 Pelvic and perineal pain: Secondary | ICD-10-CM

## 2011-10-12 DIAGNOSIS — R109 Unspecified abdominal pain: Secondary | ICD-10-CM

## 2011-10-12 LAB — URINALYSIS, ROUTINE W REFLEX MICROSCOPIC
Bilirubin Urine: NEGATIVE
Glucose, UA: NEGATIVE mg/dL
Hgb urine dipstick: NEGATIVE
Ketones, ur: NEGATIVE mg/dL
Leukocytes, UA: NEGATIVE
Nitrite: NEGATIVE
Protein, ur: NEGATIVE mg/dL
Specific Gravity, Urine: 1.02 (ref 1.005–1.030)
Urobilinogen, UA: 0.2 mg/dL (ref 0.0–1.0)
pH: 7 (ref 5.0–8.0)

## 2011-10-12 LAB — CBC
HCT: 39.5 % (ref 36.0–46.0)
Hemoglobin: 13.3 g/dL (ref 12.0–15.0)
MCH: 31.6 pg (ref 26.0–34.0)
MCHC: 33.7 g/dL (ref 30.0–36.0)
MCV: 93.8 fL (ref 78.0–100.0)
Platelets: 201 10*3/uL (ref 150–400)
RBC: 4.21 MIL/uL (ref 3.87–5.11)
RDW: 12.6 % (ref 11.5–15.5)
WBC: 11.9 10*3/uL — ABNORMAL HIGH (ref 4.0–10.5)

## 2011-10-12 LAB — POCT PREGNANCY, URINE: Preg Test, Ur: NEGATIVE

## 2011-10-12 MED ORDER — KETOROLAC TROMETHAMINE 60 MG/2ML IM SOLN
60.0000 mg | Freq: Once | INTRAMUSCULAR | Status: AC
  Administered 2011-10-12: 60 mg via INTRAMUSCULAR
  Filled 2011-10-12: qty 2

## 2011-10-12 NOTE — Telephone Encounter (Signed)
[facility] to see if I could move pt appt up per Dr Carie Caddy told me 4/22 was the 1st available and that if pt was still w/abd pain and vomiting she should go to [facility]U at [facility] and be seen in their ER and they will probably do an U/S to visualize IUD,etc. Mom Victorino Dike is advised and she is in agreement and will notify dgt today of the recommendations. Will notify Dr Edmonia James.

## 2011-10-12 NOTE — MAU Provider Note (Signed)
History     CSN: [id number]  Arrival date and time: 10/12/11 2204   None     No chief complaint on file.  HPI 21 y.o. [id number] with pelvic pain for greater than 1 month, has been evaluated multiple times for this complaint. Has been treated for PID and Pyelonephritis with some relief, but recurrence of pain after antibiotic course completed. Sees Dr. Edmonia James at [facility], recommended removal of IUD, but unable to remove in clinic as strings were not visible. Pelvic and abd u/s were normal, IUD visualized in appropriate position. Had appt for GI eval with Dr. Arlyce Dice yesterday, he felt that pain was GYN in origin and stated he would not recommend further GI workup until IUD was removed. Has appt in GYN clinic on 4/22, but states that pain has worsened and she can't wait that long. Has percocet for pain, but states it does not help, has not taken anything for pain in a few days.    Past Medical History  Diagnosis Date  . Tobacco abuse   . Bipolar I disorder   . PTSD (post-traumatic stress disorder)   . DM mellitus, gestational   . Post partum depression     No past surgical history on file.  Family History  Problem Relation Age of Onset  . Breast cancer Paternal Grandmother   . Colon cancer Paternal Grandfather   . Colon cancer Maternal Grandfather   . Colon polyps Maternal Aunt   . Diabetes Mother     maternal aunts and uncles  . Diabetes Sister   . Diabetes Father   . Heart disease Father     History  Substance Use Topics  . Smoking status: Current Everyday Smoker -- 0.8 packs/day    Types: Cigarettes  . Smokeless tobacco: Never Used   Comment: decreased smoking   . Alcohol Use: No    Allergies:  Allergies  Allergen Reactions  . Codeine Anaphylaxis and Hives    No prescriptions prior to admission    Review of Systems  Constitutional: Negative.   Respiratory: Negative.   Cardiovascular: Negative.   Gastrointestinal: Positive for nausea, vomiting and abdominal  pain. Negative for diarrhea and constipation.  Genitourinary: Negative for dysuria, urgency, frequency, hematuria and flank pain.       Negative for vaginal bleeding, vaginal discharge  Musculoskeletal: Negative.   Neurological: Negative.   Psychiatric/Behavioral: Negative.    Physical Exam   Last menstrual period 09/18/2011.  Physical Exam  Constitutional: She is oriented to person, place, and time. She appears well-developed and well-nourished. No distress.  HENT:  Head: Normocephalic and atraumatic.  Cardiovascular: Normal rate, regular rhythm and normal heart sounds.   Respiratory: Effort normal and breath sounds normal. No respiratory distress.  GI: Soft. Bowel sounds are normal. She exhibits no distension and no mass. There is no tenderness. There is no rebound and no guarding.  Genitourinary: There is no rash or lesion on the right labia. There is no rash or lesion on the left labia. Uterus is tender. Uterus is not deviated, not enlarged and not fixed. Cervix exhibits no motion tenderness, no discharge and no friability. Right adnexum displays tenderness. Right adnexum displays no mass and no fullness. Left adnexum displays tenderness. Left adnexum displays no mass and no fullness. No erythema, tenderness or bleeding around the vagina. Vaginal discharge (thin white nonmalodorous) found.       IUD strings not visualized, attempted to reach IUD with Tresa Endo without success  Neurological: She is alert and oriented  to person, place, and time.  Skin: Skin is warm and dry.  Psychiatric: She has a normal mood and affect.    MAU Course  Procedures Results for orders placed during the hospital encounter of 10/12/11 (from the past 24 hour(s))  URINALYSIS, ROUTINE W REFLEX MICROSCOPIC     Status: Normal   Collection Time   10/12/11 10:15 PM      Component Value Range   Color, Urine YELLOW  YELLOW    APPearance CLEAR  CLEAR    Specific Gravity, Urine 1.020  1.005 - 1.030    pH 7.0  5.0 -  8.0    Glucose, UA NEGATIVE  NEGATIVE (mg/dL)   Hgb urine dipstick NEGATIVE  NEGATIVE    Bilirubin Urine NEGATIVE  NEGATIVE    Ketones, ur NEGATIVE  NEGATIVE (mg/dL)   Protein, ur NEGATIVE  NEGATIVE (mg/dL)   Urobilinogen, UA 0.2  0.0 - 1.0 (mg/dL)   Nitrite NEGATIVE  NEGATIVE    Leukocytes, UA NEGATIVE  NEGATIVE   CBC     Status: Abnormal   Collection Time   10/12/11 10:35 PM      Component Value Range   WBC 11.9 (*) 4.0 - 10.5 (K/uL)   RBC 4.21  3.87 - 5.11 (MIL/uL)   Hemoglobin 13.3  12.0 - 15.0 (g/dL)   HCT 40.9  81.1 - 91.4 (%)   MCV 93.8  78.0 - 100.0 (fL)   MCH 31.6  26.0 - 34.0 (pg)   MCHC 33.7  30.0 - 36.0 (g/dL)   RDW 78.2  95.6 - 21.3 (%)   Platelets 201  150 - 400 (K/uL)  POCT PREGNANCY, URINE     Status: Normal   Collection Time   10/12/11 10:41 PM      Component Value Range   Preg Test, Ur NEGATIVE  NEGATIVE    Ct Abdomen Pelvis Wo Contrast  09/25/2011  *RADIOLOGY REPORT*  Clinical Data: Abdominal pain for 4 weeks.  Urinary tract infection.  Pain.  CT ABDOMEN AND PELVIS WITHOUT CONTRAST  Technique:  Multidetector CT imaging of the abdomen and pelvis was performed following the standard protocol without intravenous contrast.  Comparison: 09/09/2011  Findings: Lung bases are unremarkable.  There are changes of early medullary nephrocalcinosis, right greater than left.  No evidence for discrete renal or ureteral calculi.  The changes within the renal parenchyma on the previous exams, suggesting pyelonephritis are not apparent today but would not be expected to be identified given the lack of intravenous contrast.  No focal abnormality identified within the liver, spleen, pancreas, or adrenal glands.  The gallbladder is present.  The stomach and small bowel loops have a normal appearance. The appendix is well seen and has a normal appearance.  Colonic loops are normal in appearance.  The uterus is present and contains intrauterine device in the expected location. No  retroperitoneal or mesenteric adenopathy. No evidence for aortic aneurysm. Visualized osseous structures have a normal appearance.  IMPRESSION:  1.  Changes of medullary nephrocalcinosis, right greater than left. 2.  No intrarenal calculi or urinary tract obstruction. 3.  No evidence for acute appendicitis.  Original Report Authenticated By: Patterson Hammersmith, M.D.   US Abdomen Complete  09/22/2011  *RADIOLOGY REPORT*  Clinical Data:  21 year old female with abdominal pain.  ABDOMINAL ULTRASOUND COMPLETE  Comparison:  09/09/2011 CT  Findings:  Gallbladder:  The gallbladder is unremarkable. There is no evidence of gallstones, gallbladder wall thickening, or pericholecystic fluid.  Common Bile Duct:  There  is no evidence of intrahepatic or extrahepatic biliary dilation. The CBD measures 4.0 mm in greatest diameter.  Liver:  The liver is within normal limits in parenchymal echogenicity. No focal abnormalities are identified.  IVC:  Appears normal.  Pancreas:  Although the pancreas is difficult to visualize in its entirety, no focal pancreatic abnormality is identified.  Spleen:  Within normal limits in size and echotexture.  Right kidney:  The right kidney is normal in size and parenchymal echogenicity.  There is no evidence of solid mass, hydronephrosis or definite renal calculi.  The right kidney measures 12.3 cm.  Left kidney:  The left kidney is normal in size and parenchymal echogenicity.  There is no evidence of solid mass, hydronephrosis or definite renal calculi.   The left kidney measures 12.0 cm.  Abdominal Aorta:  No abdominal aortic aneurysm identified.  There is no evidence of ascites.  IMPRESSION: Negative abdominal ultrasound.  Original Report Authenticated By: Rosendo Gros, M.D.   US Transvaginal Non-ob  10/13/2011  *RADIOLOGY REPORT*  Clinical Data: Pelvic pain; assess position of intrauterine device.  TRANSVAGINAL ULTRASOUND OF PELVIS  Technique:  Transvaginal ultrasound examination of the  pelvis was performed including evaluation of the uterus, ovaries, adnexal regions, and pelvic cul-de-sac.  Comparison:  CT of the abdomen and pelvis performed 09/25/2011, and pelvic ultrasound performed 09/22/2011  Findings:  Uterus:  Normal in size and appearance; measures 8.2 x 3.1 x 4.8 cm.  Endometrium: Normal in thickness and appearance; measures 0.3 cm in thickness.  The intrauterine device is noted in expected position, at the fundus of the uterus.  Right ovary: Normal appearance/no adnexal mass; measures 3.4 x 2.1 x 2.1 cm.  Left ovary: Normal appearance/no adnexal mass; measures 3.5 x 1.9 x 2.1 cm.  Other Findings:  No free fluid is seen within the pelvic cul-de- sac.  IMPRESSION: Normal study.  No evidence of pelvic mass or other significant abnormality.  Intrauterine device noted in expected position, at the fundus of the uterus.  Original Report Authenticated By: Tonia Ghent, M.D.   US Transvaginal Non-ob  09/22/2011  *RADIOLOGY REPORT*  Clinical Data: 21 year old female with left-sided pelvic pain.  TRANSABDOMINAL AND TRANSVAGINAL ULTRASOUND OF PELVIS Technique:  Both transabdominal and transvaginal ultrasound examinations of the pelvis were performed. Transabdominal technique was performed for global imaging of the pelvis including uterus, ovaries, adnexal regions, and pelvic cul-de-sac.  Comparison: None.   It was necessary to proceed with endovaginal exam following the transabdominal exam to visualize the endometrium and ovaries.  Findings:  Uterus: The uterus is normal in appearance and size measuring 7.2 x 3.3 x 4.9 cm.  No uterine masses are identified.  Endometrium: An IUD is present slightly limiting evaluation.  The endometrium is try layered measuring 3.9 mm in greatest diameter.  Right ovary:  The right ovary is unremarkable measuring 2.6 x 1.5 x 2.1 cm.  Left ovary: The left ovary is unremarkable measuring 2.3 x 2.1 x 2.6 cm.  Other findings: There is no evidence of free fluid or adnexal  mass.  IMPRESSION: IUD within the uterus, otherwise normal study. No evidence of pelvic mass or other significant abnormality.  Original Report Authenticated By: Rosendo Gros, M.D.   US Pelvis Complete  09/22/2011  *RADIOLOGY REPORT*  Clinical Data: 21 year old female with left-sided pelvic pain.  TRANSABDOMINAL AND TRANSVAGINAL ULTRASOUND OF PELVIS Technique:  Both transabdominal and transvaginal ultrasound examinations of the pelvis were performed. Transabdominal technique was performed for global imaging of the pelvis including  uterus, ovaries, adnexal regions, and pelvic cul-de-sac.  Comparison: None.   It was necessary to proceed with endovaginal exam following the transabdominal exam to visualize the endometrium and ovaries.  Findings:  Uterus: The uterus is normal in appearance and size measuring 7.2 x 3.3 x 4.9 cm.  No uterine masses are identified.  Endometrium: An IUD is present slightly limiting evaluation.  The endometrium is try layered measuring 3.9 mm in greatest diameter.  Right ovary:  The right ovary is unremarkable measuring 2.6 x 1.5 x 2.1 cm.  Left ovary: The left ovary is unremarkable measuring 2.3 x 2.1 x 2.6 cm.  Other findings: There is no evidence of free fluid or adnexal mass.  IMPRESSION: IUD within the uterus, otherwise normal study. No evidence of pelvic mass or other significant abnormality.  Original Report Authenticated By: Rosendo Gros, M.D.   Pain decreased from 9/10 to 5/10 following Toradol 60 mg IM, pt does not appear in acute distress   Assessment and Plan  21 y.o. [id number] with pelvic pain Pt has appointment in GYN clinic to follow up for IUD removal, per patient request I will send a request to the clinic to see if we can get an appointment any sooner than 4/22, she is aware that this may not be possible, also discussed with patient that pain may or may not be related to IUD and removal may not resolve pain Encouraged to take Motrin on schedule while pain is  ongoing, Percocet PRN   Granvel Proudfoot 10/12/2011, 10:22 PM

## 2011-10-12 NOTE — MAU Note (Signed)
Pt states she was seen at [facility] today-they told her if the abd pain got worse,to [facility] and we would remoave the [employer]

## 2011-10-13 MED ORDER — IBUPROFEN 600 MG PO TABS: 600.0000 mg | ORAL_TABLET | Freq: Four times a day (QID) | ORAL | Status: AC | PRN

## 2011-10-13 MED ORDER — OXYCODONE-ACETAMINOPHEN 5-325 MG PO TABS: 1.0000 | ORAL_TABLET | ORAL | Status: AC | PRN

## 2011-10-13 NOTE — Progress Notes (Signed)
  Subjective:    Patient ID: Michelle [name], female    DOB: 01-31-1991, 21 y.o.   MRN: [id number]  HPI Abdominal pain: Pt here for f/up.  Pt reports continued lower abdominal pain.  N/v resolved over last weekend but return again last night- had 3-4 episodes of vomiting overnight.  Reports that there were speaks of red color in her vomit- was concerned that it may be blood.  Pt was seen at GI doctor since last appointment.  Dr. Arlyce Dice.  He advised that we move forward with IUD removal and if pain persists after IUD removal he will consider doing endoscopies at that time.  Pain approximately the same intensity that it was at last appointment.  No worsening.  No fever.  No diarrhea.  No constipation.  No blood in stool.  No dark stools.  No back pain.  Not taking ranitidine rx that was given at last appointment.  Pt states that it cost too much.  Has used some of her mothers prevacid without any relief over the past 2-3 days.    Review of Systems As per above.      Objective:   Physical Exam  Constitutional: She appears well-developed and well-nourished.  HENT:  Head: Normocephalic and atraumatic.  Eyes: Pupils are equal, round, and reactive to light.  Neck: Normal range of motion.  Cardiovascular: Normal rate, regular rhythm and normal heart sounds.   No murmur heard. Pulmonary/Chest: Effort normal. No respiratory distress. She has no wheezes. She has no rales.  Abdominal: Soft. She exhibits no distension and no mass. There is tenderness (right and left lower quadrants- mild to mod). There is no rebound and no guarding.  Musculoskeletal: She exhibits no edema.       No CVA tenderness  Skin: No rash noted.          Assessment & Plan:

## 2011-10-13 NOTE — Assessment & Plan Note (Addendum)
As per GI recommendations.  Tried to get pt's April 22nd appointment for IUD removal moved to a sooner date- but unable.  Pt instructed to go to MAU if any new or worsening symptoms over the weekend. Otherwise pt is to follow up with me during the work week.  Pt is to Return if no improvement or if new or worsening of symptoms.   Return if no improvement or if new or worsening of symptoms.

## 2011-10-13 NOTE — MAU Provider Note (Signed)
Chart reviewed and agree with management and plan.  

## 2011-10-23 ENCOUNTER — Encounter: Payer: Self-pay | Admitting: Family Medicine

## 2011-10-23 ENCOUNTER — Encounter: Payer: Self-pay | Admitting: Obstetrics and Gynecology

## 2011-10-23 ENCOUNTER — Ambulatory Visit (INDEPENDENT_AMBULATORY_CARE_PROVIDER_SITE_OTHER): Payer: Medicaid Other | Admitting: Obstetrics and Gynecology

## 2011-10-23 ENCOUNTER — Ambulatory Visit (HOSPITAL_COMMUNITY)
Admission: RE | Admit: 2011-10-23 | Discharge: 2011-10-23 | Disposition: A | Discharge status: Home / Self Care | Payer: Medicaid Other | Source: Ambulatory Visit | Attending: Family Medicine | Admitting: Family Medicine

## 2011-10-23 ENCOUNTER — Ambulatory Visit (INDEPENDENT_AMBULATORY_CARE_PROVIDER_SITE_OTHER): Payer: Medicaid Other | Admitting: Family Medicine

## 2011-10-23 VITALS — BP 112/76 | HR 95 | Temp 98.1°F | Ht 63.0 in | Wt 141.3 lb

## 2011-10-23 DIAGNOSIS — R079 Chest pain, unspecified: Secondary | ICD-10-CM | POA: Insufficient documentation

## 2011-10-23 DIAGNOSIS — Z30432 Encounter for removal of intrauterine contraceptive device: Secondary | ICD-10-CM

## 2011-10-23 DIAGNOSIS — R109 Unspecified abdominal pain: Secondary | ICD-10-CM

## 2011-10-23 NOTE — Patient Instructions (Signed)
Smoking Cessation This document explains the best ways for you to quit smoking and new treatments to help. It lists new medicines that can double or triple your chances of quitting and quitting for good. It also considers ways to avoid relapses and concerns you may have about quitting, including weight gain. NICOTINE: A POWERFUL ADDICTION If you have tried to quit smoking, you know how hard it can be. It is hard because nicotine is a very addictive drug. For some people, it can be as addictive as heroin or cocaine. Usually, people make 2 or 3 tries, or more, before finally being able to quit. Each time you try to quit, you can learn about what helps and what hurts. Quitting takes hard work and a lot of effort, but you can quit smoking. QUITTING SMOKING IS ONE OF THE MOST IMPORTANT THINGS YOU WILL EVER DO.  You will live longer, feel better, and live better.   The impact on your body of quitting smoking is felt almost immediately:   Within 20 minutes, blood pressure decreases. Pulse returns to its normal level.   After 8 hours, carbon monoxide levels in the blood return to normal. Oxygen level increases.   After 24 hours, chance of heart attack starts to decrease. Breath, hair, and body stop smelling like smoke.   After 48 hours, damaged nerve endings begin to recover. Sense of taste and smell improve.   After 72 hours, the body is virtually free of nicotine. Bronchial tubes relax and breathing becomes easier.   After 2 to 12 weeks, lungs can hold more air. Exercise becomes easier and circulation improves.   Quitting will reduce your risk of having a heart attack, stroke, cancer, or lung disease:   After 1 year, the risk of coronary heart disease is cut in half.   After 5 years, the risk of stroke falls to the same as a nonsmoker.   After 10 years, the risk of lung cancer is cut in half and the risk of other cancers decreases significantly.   After 15 years, the risk of coronary heart  disease drops, usually to the level of a nonsmoker.   If you are pregnant, quitting smoking will improve your chances of having a healthy baby.   The people you live with, especially your children, will be healthier.   You will have extra money to spend on things other than cigarettes.  FIVE KEYS TO QUITTING Studies have shown that these 5 steps will help you quit smoking and quit for good. You have the best chances of quitting if you use them together: 1. Get ready.  2. Get support and encouragement.  3. Learn new skills and behaviors.  4. Get medicine to reduce your nicotine addiction and use it correctly.  5. Be prepared for relapse or difficult situations. Be determined to continue trying to quit, even if you do not succeed at first.  1. GET READY  Set a quit date.   Change your environment.   Get rid of ALL cigarettes, ashtrays, matches, and lighters in your home, car, and place of work.   Do not let people smoke in your home.   Review your past attempts to quit. Think about what worked and what did not.   Once you quit, do not smoke. NOT EVEN A PUFF!  2. GET SUPPORT AND ENCOURAGEMENT Studies have shown that you have a better chance of being successful if you have help. You can get support in many ways.  Tell   your family, friends, and coworkers that you are going to quit and need their support. Ask them not to smoke around you.   Talk to your caregivers (doctor, dentist, nurse, pharmacist, psychologist, and/or smoking counselor).   Get individual, group, or telephone counseling and support. The more counseling you have, the better your chances are of quitting. Programs are available at local hospitals and health centers. Call your local health department for information about programs in your area.   Spiritual beliefs and practices may help some smokers quit.   Quit meters are small computer programs online or downloadable that keep track of quit statistics, such as amount  of "quit-time," cigarettes not smoked, and money saved.   Many smokers find one or more of the many self-help books available useful in helping them quit and stay off tobacco.  3. LEARN NEW SKILLS AND BEHAVIORS  Try to distract yourself from urges to smoke. Talk to someone, go for a walk, or occupy your time with a task.   When you first try to quit, change your routine. Take a different route to work. Drink tea instead of coffee. Eat breakfast in a different place.   Do something to reduce your stress. Take a hot bath, exercise, or read a book.   Plan something enjoyable to do every day. Reward yourself for not smoking.   Explore interactive web-based programs that specialize in helping you quit.  4. GET MEDICINE AND USE IT CORRECTLY Medicines can help you stop smoking and decrease the urge to smoke. Combining medicine with the above behavioral methods and support can quadruple your chances of successfully quitting smoking. The [place] Food and Drug Administration (FDA) has approved 7 medicines to help you quit smoking. These medicines fall into 3 categories.  Nicotine replacement therapy (delivers nicotine to your body without the negative effects and risks of smoking):   Nicotine gum: Available over-the-counter.   Nicotine lozenges: Available over-the-counter.   Nicotine inhaler: Available by prescription.   Nicotine nasal spray: Available by prescription.   Nicotine skin patches (transdermal): Available by prescription and over-the-counter.   Antidepressant medicine (helps people abstain from smoking, but how this works is unknown):   Bupropion sustained-release (SR) tablets: Available by prescription.   Nicotinic receptor partial agonist (simulates the effect of nicotine in your brain):   Varenicline tartrate tablets: Available by prescription.   Ask your caregiver for advice about which medicines to use and how to use them. Carefully read the information on the package.    Everyone who is trying to quit may benefit from using a medicine. If you are pregnant or trying to become pregnant, nursing an infant, you are under age 18, or you smoke fewer than 10 cigarettes per day, talk to your caregiver before taking any nicotine replacement medicines.   You should stop using a nicotine replacement product and call your caregiver if you experience nausea, dizziness, weakness, vomiting, fast or irregular heartbeat, mouth problems with the lozenge or gum, or redness or swelling of the skin around the patch that does not go away.   Do not use any other product containing nicotine while using a nicotine replacement product.   Talk to your caregiver before using these products if you have diabetes, heart disease, asthma, stomach ulcers, you had a recent heart attack, you have high blood pressure that is not controlled with medicine, a history of irregular heartbeat, or you have been prescribed medicine to help you quit smoking.  5. BE PREPARED FOR RELAPSE OR   DIFFICULT SITUATIONS  Most relapses occur within the first 3 months after quitting. Do not be discouraged if you start smoking again. Remember, most people try several times before they finally quit.   You may have symptoms of withdrawal because your body is used to nicotine. You may crave cigarettes, be irritable, feel very hungry, cough often, get headaches, or have difficulty concentrating.   The withdrawal symptoms are only temporary. They are strongest when you first quit, but they will go away within 10 to 14 days.  Here are some difficult situations to watch for:  Alcohol. Avoid drinking alcohol. Drinking lowers your chances of successfully quitting.   Caffeine. Try to reduce the amount of caffeine you consume. It also lowers your chances of successfully quitting.   Other smokers. Being around smoking can make you want to smoke. Avoid smokers.   Weight gain. Many smokers will gain weight when they quit, usually  less than 10 pounds. Eat a healthy diet and stay active. Do not let weight gain distract you from your main goal, quitting smoking. Some medicines that help you quit smoking may also help delay weight gain. You can always lose the weight gained after you quit.   Bad mood or depression. There are a lot of ways to improve your mood other than smoking.  If you are having problems with any of these situations, talk to your caregiver. SPECIAL SITUATIONS AND CONDITIONS Studies suggest that everyone can quit smoking. Your situation or condition can give you a special reason to quit.  Pregnant women/new mothers: By quitting, you protect your baby's health and your own.   Hospitalized patients: By quitting, you reduce health problems and help healing.   Heart attack patients: By quitting, you reduce your risk of a second heart attack.   Lung, head, and neck cancer patients: By quitting, you reduce your chance of a second cancer.   Parents of children and adolescents: By quitting, you protect your children from illnesses caused by secondhand smoke.  QUESTIONS TO THINK ABOUT Think about the following questions before you try to stop smoking. You may want to talk about your answers with your caregiver.  Why do you want to quit?   If you tried to quit in the past, what helped and what did not?   What will be the most difficult situations for you after you quit? How will you plan to handle them?   Who can help you through the tough times? Your family? Friends? Caregiver?   What pleasures do you get from smoking? What ways can you still get pleasure if you quit?  Here are some questions to ask your caregiver:  How can you help me to be successful at quitting?   What medicine do you think would be best for me and how should I take it?   What should I do if I need more help?   What is smoking withdrawal like? How can I get information on withdrawal?  Quitting takes hard work and a lot of effort,  but you can quit smoking. FOR MORE INFORMATION  Smokefree.gov (http://www.davis-sullivan.com/) provides free, accurate, evidence-based information and professional assistance to help support the immediate and long-term needs of people trying to quit smoking. Document Released: 06/27/2001 Document Revised: 06/22/2011 Document Reviewed: 04/19/2009 [facility] Patient Information 2012 [place], [place].Pregnancy If you are planning on getting pregnant, it is a good idea to make a preconception appointment with your care- giver to discuss having a healthy lifestyle before getting pregnant. Such  as, diet, weight, exercise, taking prenatal vitamins especially folic acid (it helps prevent brain and spinal cord defects), avoiding alcohol, smoking and illegal drugs, medical problems (diabetes, convulsions), family history of genetic problems, working conditions and immunizations. It is better to have knowledge of these things and do something about them before getting pregnant. In your pregnancy, it is important to follow certain guidelines to have a healthy baby. It is very important to get good prenatal care and follow your caregiver's instructions. Prenatal care includes all the medical care you receive before your baby's birth. This helps to prevent problems during the pregnancy and childbirth. HOME CARE INSTRUCTIONS   Start your prenatal visits by the 12th week of pregnancy or before when possible. They are usually scheduled monthly at first. They are more often in the last 2 months before delivery. It is important that you keep your caregiver's appointments and follow your caregiver's instructions regarding medication use, exercise, and diet.   During pregnancy, you are providing food for you and your baby. Eat a regular, well-balanced diet. Choose foods such as meat, fish, milk and other dairy products, vegetables, fruits, whole-grain breads and cereals. Your caregiver will inform you of the ideal weight gain  depending on your current height and weight. Drink lots of liquids. Try to drink 8 glasses of water a day.   Alcohol is associated with a number of birth defects including fetal alcohol syndrome. It is best to avoid alcohol completely. Smoking will cause low birth rate and prematurity. Use of alcohol and nicotine during your pregnancy also increases the chances that your child will be chemically dependent later in their life and may contribute to SIDS (Sudden Infant Death Syndrome).   Do not use illegal drugs.   Only take prescription or over-the-counter medications that are recommended by your caregiver. Other medications can cause genetic and physical problems in the baby.   Morning sickness can often be helped by keeping soda crackers at the bedside. Eat a couple before arising in the morning.   A sexual relationship may be continued until near the end of pregnancy if there are no other problems such as early (premature) leaking of amniotic fluid from the membranes, vaginal bleeding, painful intercourse or belly (abdominal) pain.   Exercise regularly. Check with your caregiver if you are unsure of the safety of some of your exercises.   Do not use hot tubs, steam rooms or saunas. These increase the risk of fainting or passing out and hurting yourself and the baby. Swimming is OK for exercise. Get plenty of rest, including afternoon naps when possible especially in the third trimester.   Avoid toxic odors and chemicals.   Do not wear high heels. They may cause you to lose your balance and fall.   Do not lift over 5 pounds. If you do lift anything, lift with your legs and thighs, not your back.   Avoid long trips, especially in the third trimester.   If you have to travel out of the city or state, take a copy of your medical records with you.  SEEK IMMEDIATE MEDICAL CARE IF:   You develop an unexplained oral temperature above 102 F (38.9 C), or as your caregiver suggests.   You have  leaking of fluid from the vagina. If leaking membranes are suspected, take your temperature and inform your caregiver of this when you call.   There is vaginal spotting or bleeding. Notify your caregiver of the amount and how many pads are used.  You continue to feel sick to your stomach (nauseous) and have no relief from remedies suggested, or you throw up (vomit) blood or coffee ground like materials.   You develop upper abdominal pain.   You have round ligament discomfort in the lower abdominal area. This still must be evaluated by your caregiver.   You feel contractions of the uterus.   You do not feel the baby move, or there is less movement than before.   You have painful urination.   You have abnormal vaginal discharge.   You have persistent diarrhea.   You get a severe headache.   You have problems with your vision.   You develop muscle weakness.   You feel dizzy and faint.   You develop shortness of breath.   You develop chest pain.   You have back pain that travels down to your leg and feet.   You feel irregular or a very fast heartbeat.   You develop excessive weight gain in a short period of time (5 pounds in 3 to 5 days).   You are involved with a domestic violence situation.  Document Released: 07/03/2005 Document Revised: 06/22/2011 Document Reviewed: 12/25/2008 [facility] Patient Information 2012 [place], [place].

## 2011-10-23 NOTE — Progress Notes (Signed)
  Subjective:    Patient ID: Michelle [name], female    DOB: 1990-09-25, 21 y.o.   MRN: [id number]  HPI Chest pain: Can't breath, sharp pain left of sternum, worse with deep, worse with movement, occurred while laying down watching tv at 1 am.  Not worse with ambulation.  Not relieved with rest.  Constant.  Sitting still and not moving, and shallow breathing makes better.  No trauma to chest. No palpitations. No sob.  No syncope.  No weakness in arms and legs.  States it Doesn't feel like panic attack- last panic attack was when she was age 45. No lower extremity edema. No calf pain. No calf redness.  Lower abdomainal pain: Did go to the [facility] on 3/28. Per patient-They attempted to remove the IUD but were unsuccessful. Told her that they would need the instruments that are in the OB/GYN specialty clinic. And told her to come to her scheduled appointment on April 22. Pain actually improved over the past 2 weeks.  But then at 1 AM began to have lower abdominal pain that began at the same time as the above stated chest pain. Also began to have nausea and vomiting with any by mouth intake. Unable to keep anything down since 1 AM. No dark stools. No bloody stools. No rash. No by mouth intake since 1 AM.    Review of Systems As per above.    Objective:   Physical Exam  Constitutional: She appears well-developed and well-nourished.  HENT:  Head: Normocephalic and atraumatic.  Eyes: Pupils are equal, round, and reactive to light. Right eye exhibits no discharge. Left eye exhibits no discharge.  Neck: Normal range of motion. Neck supple.  Cardiovascular: Normal rate, regular rhythm and normal heart sounds.   No murmur heard.      At time of exam HR 90.    Pulmonary/Chest: Effort normal and breath sounds normal. No respiratory distress. She has no wheezes. She has no rales.       Poor inspiratory effort 2/2 discomfort in left rib/chest area with deep breath  Abdominal: Soft. She exhibits no  distension and no mass. There is tenderness (mod lower abd tenderness). There is no rebound and no guarding.  Musculoskeletal: She exhibits no edema.  Lymphadenopathy:    She has no cervical adenopathy.  Neurological: She is alert.  Skin: No rash noted.  Psychiatric: She has a normal mood and affect. Her behavior is normal.          Assessment & Plan:

## 2011-10-23 NOTE — Progress Notes (Signed)
Here today for IUD complications- IUD was put in about 4 years ago. States breast pain started about 1 year ago, then in February started having intermittent pain in lower abdomen- at first started in chest down left side to lower abdomen , then changed to chest pain stopped, but pain spread to all over lower abdomen.  States did  Have chest pains last night, and pain worse in lower abdomen =11. Has seen Dr. Edmonia James at [facility]- and they did a lot of blood, and std tests, and admitted x 48 hours. Also saw GI doctor-at [place] and he said it was gynecology related. Also states when first got IUD didn't have a period for 2 years , then 2011 started having periods again , and now for about a year has had bleeding almost every day except for 2 days.

## 2011-10-23 NOTE — Patient Instructions (Signed)
Chest wall pain: Most likely due to muscle strain. Your ekg was normal and your heart rate is now normal. Ibuprofen 800mg  every 8 hours as needed.   Lower abd pain: Ibuprofen as per above. Go for appointment today at 1:45.   Return to see me in 1 week or sooner if new or worsening of symptoms.

## 2011-10-23 NOTE — Progress Notes (Signed)
  HPI  Requests removal of Mirena IUD which has been in for 4 years. She would like to attempt pregnancy. She is also unhappy with the IUD feeling that cause her to have irregular bleeding and abdominal pain. Has had neg GC/CT cultures and Pap recently at [facility].   Review of Systems     Objective:   Physical Exam  Gen: WN WD in NAD Bimanual: cx posterior, uterus anteverted  IUD Removal  Patient was in the dorsal lithotomy position, normal external genitalia was noted.  A speculum was placed in the patient's vagina, normal discharge was noted, no lesions. The nullipparous cervix was visualized, no lesions, no abnormal discharge. The strings of the IUD were not visualized, after plastic dilator traverse the internal os and able to feel device with Cytobrush. She tolerated 3 attempts with Tresa Endo forceps. After arrest consulted Dr. Jolayne Panther who progressed anterior lip with a tenaculum.Kelly forceps were introduced into the endometrial cavity without success. After that hook was used and the IUD was grasped and removed in its entirety.  The strings of the IUD were grasped and pulled using ring forceps.  The IUD was successfully removed in its entirety.  Patient tolerated the procedure well.        Assessment & Plan:  Successful removal of IUD Will prescribe prenatal vitamins and discuss pregnancy precautions and advised her to wait for one spontaneous instrument.before attempting pregnancy. Keep menstrual calendar F/U with [facility]

## 2011-10-24 DIAGNOSIS — R079 Chest pain, unspecified: Secondary | ICD-10-CM | POA: Insufficient documentation

## 2011-10-24 NOTE — Assessment & Plan Note (Signed)
Atypical chest pain.  Story is not consistent with cardiac cause.  EKG wnl.  HR normalized once pt calmed down and exam room and was no longer crying.  Most likely chest pain is due to MSK strain in left rib area or possible anxiety.  Pt to take motrin 800mg  q 8 hour prn.  Pt is to return for f/up on Friday.  If new or worsening of symptoms pt is to return sooner.

## 2011-10-24 NOTE — Assessment & Plan Note (Signed)
[facility]  Pt to go for IUD removal.  As per previous notes- the hope is that pain will resolve with iud removal.  If pain doesn't will reconsult GI for further investigation as to cause of this pain.

## 2011-10-27 ENCOUNTER — Emergency Department (HOSPITAL_COMMUNITY)
Admission: EM | Admit: 2011-10-27 | Discharge: 2011-10-27 | Disposition: A | Discharge status: Home / Self Care | Payer: Medicaid Other | Source: Home / Self Care | Attending: Emergency Medicine | Admitting: Emergency Medicine

## 2011-10-27 ENCOUNTER — Emergency Department (INDEPENDENT_AMBULATORY_CARE_PROVIDER_SITE_OTHER): Payer: Medicaid Other

## 2011-10-27 ENCOUNTER — Encounter (HOSPITAL_COMMUNITY): Payer: Self-pay | Admitting: *Deleted

## 2011-10-27 DIAGNOSIS — S9000XA Contusion of unspecified ankle, initial encounter: Secondary | ICD-10-CM

## 2011-10-27 DIAGNOSIS — S9002XA Contusion of left ankle, initial encounter: Secondary | ICD-10-CM

## 2011-10-27 MED ORDER — NAPROXEN 500 MG PO TABS: 500.0000 mg | ORAL_TABLET | Freq: Two times a day (BID) | ORAL | Status: DC

## 2011-10-27 NOTE — ED Notes (Signed)
Pt states she hit her left ankle/lower leg on the door jam last pm.  C/o swelling and pain to left lateral ankle and foot.  Using crutches to walk.

## 2011-10-27 NOTE — ED Provider Notes (Signed)
History     CSN: [id number]  Arrival date & time 10/27/11  1820   First MD Initiated Contact with Patient 10/27/11 1842     7:52 PM HPI KAYLEI FRINK is a 21 y.o. female complaining of left ankle pain. States last night she hit her left lateral ankle on a door jam. States that her ankle immediately began to bruise. Reports waking with swelling and was unable to walk without severe pain. Has been using old crutches.  Patient is a 21 y.o. female presenting with ankle pain. The history is provided by the patient.  Ankle Pain  The incident occurred yesterday. The incident occurred at home. The pain is present in the left ankle. The quality of the pain is described as aching. The pain is severe. The pain has been constant since onset. Associated symptoms include inability to bear weight and loss of motion. Pertinent negatives include no numbness, no muscle weakness, no loss of sensation and no tingling. The symptoms are aggravated by bearing weight and palpation. She has tried rest, ice and elevation for the symptoms. The treatment provided no relief.    Past Medical History  Diagnosis Date  . Tobacco abuse   . Bipolar I disorder   . PTSD (post-traumatic stress disorder)   . DM mellitus, gestational   . Post partum depression   . Menorrhagia     History reviewed. No pertinent past surgical history.  Family History  Problem Relation Age of Onset  . Breast cancer Paternal Grandmother   . Colon cancer Paternal Grandfather   . Colon cancer Maternal Grandfather   . Colon polyps Maternal Aunt   . Diabetes Mother     maternal aunts and uncles  . Diabetes Sister   . Diabetes Father   . Heart disease Father     History  Substance Use Topics  . Smoking status: Current Everyday Smoker -- 0.5 packs/day    Types: Cigarettes  . Smokeless tobacco: Never Used   Comment: decreased smoking   . Alcohol Use: Yes     occasional    OB History    Grav Para Term Preterm Abortions TAB SAB Ect  Mult Living   2 2 2       2       Review of Systems  Constitutional: Negative for fever and chills.  Musculoskeletal: Positive for joint swelling. Negative for myalgias and back pain.       Ankle pain and swelling   Neurological: Negative for tingling and numbness.  All other systems reviewed and are negative.    Allergies  Codeine  Home Medications   Current Outpatient Rx  Name Route Sig Dispense Refill  . LEVONORGESTREL 20 MCG/24HR IU IUD Intrauterine 1 each by Intrauterine route once.    Marland Kitchen PROMETHAZINE HCL 25 MG PO TABS Oral Take 1 tablet (25 mg total) by mouth every 8 (eight) hours as needed for nausea. 20 tablet 0  . PROMETHAZINE HCL 25 MG PO TABS Oral Take 25 mg by mouth every 6 (six) hours as needed. For nausea      BP 126/68  Pulse 94  Temp(Src) 98.6 F (37 C) (Oral)  Resp 20  SpO2 96%  LMP 10/25/2011  Physical Exam  Vitals reviewed. Constitutional: She is oriented to person, place, and time. Vital signs are normal. She appears well-developed and well-nourished. No distress.  HENT:  Head: Normocephalic and atraumatic.  Eyes: Pupils are equal, round, and reactive to light.  Neck: Neck supple.  Pulmonary/Chest:  Effort normal.  Musculoskeletal:       Left ankle: She exhibits decreased range of motion, swelling and ecchymosis. She exhibits no deformity, no laceration and normal pulse. tenderness. Lateral malleolus and AITFL tenderness found. No medial malleolus, no CF ligament, no posterior TFL, no head of 5th metatarsal and no proximal fibula tenderness found. Achilles tendon normal.  Neurological: She is alert and oriented to person, place, and time.  Skin: Skin is warm and dry. No rash noted. No erythema. No pallor.  Psychiatric: She has a normal mood and affect. Her behavior is normal.    ED Course  Procedures     MDM  No Fracture or acute findings on Xray which was reviewed by myself. Will Place in ASO and Crutches and advised R.I.C.E. Likely has a  sprain or a contusion. Pt voices understanding and is ready for d/c        Thomasene Lot, Cordelia Poche 10/27/11 2051

## 2011-10-27 NOTE — Discharge Instructions (Signed)

## 2011-10-27 NOTE — ED Provider Notes (Signed)
Medical screening examination/treatment/procedure(s) were performed by non-physician practitioner and as supervising physician I was immediately available for consultation/collaboration.  Leslee Home, M.D.   Reuben Likes, MD 10/27/11 [phone]

## 2011-10-30 ENCOUNTER — Ambulatory Visit: Payer: Medicaid Other | Admitting: Family Medicine

## 2011-11-06 ENCOUNTER — Encounter: Payer: Medicaid Other | Admitting: Advanced Practice Midwife

## 2011-11-22 ENCOUNTER — Encounter: Payer: Self-pay | Admitting: Family Medicine

## 2011-11-22 NOTE — Telephone Encounter (Signed)
Error

## 2011-11-24 NOTE — Telephone Encounter (Signed)
This encounter was created in error - please disregard.

## 2011-12-03 ENCOUNTER — Inpatient Hospital Stay (HOSPITAL_COMMUNITY)
Admission: AD | Admit: 2011-12-03 | Discharge: 2011-12-03 | Disposition: A | Discharge status: Home / Self Care | Payer: Medicaid Other | Source: Ambulatory Visit | Attending: Obstetrics and Gynecology | Admitting: Obstetrics and Gynecology

## 2011-12-03 ENCOUNTER — Encounter (HOSPITAL_COMMUNITY): Payer: Self-pay

## 2011-12-03 DIAGNOSIS — Z3201 Encounter for pregnancy test, result positive: Secondary | ICD-10-CM

## 2011-12-03 DIAGNOSIS — R110 Nausea: Secondary | ICD-10-CM | POA: Insufficient documentation

## 2011-12-03 LAB — ABO/RH: ABO/RH(D): O POS

## 2011-12-03 LAB — POCT PREGNANCY, URINE: Preg Test, Ur: POSITIVE — AB

## 2011-12-03 LAB — URINALYSIS, ROUTINE W REFLEX MICROSCOPIC
Bilirubin Urine: NEGATIVE
Glucose, UA: NEGATIVE mg/dL
Hgb urine dipstick: NEGATIVE
Ketones, ur: 40 mg/dL — AB
Leukocytes, UA: NEGATIVE
Nitrite: NEGATIVE
Protein, ur: NEGATIVE mg/dL
Specific Gravity, Urine: 1.015 (ref 1.005–1.030)
Urobilinogen, UA: 0.2 mg/dL (ref 0.0–1.0)
pH: 7.5 (ref 5.0–8.0)

## 2011-12-03 LAB — HCG, QUANTITATIVE, PREGNANCY: hCG, Beta Chain, Quant, S: 5114 m[IU]/mL — ABNORMAL HIGH (ref <5)

## 2011-12-03 NOTE — MAU Note (Signed)
Nausea, sore breast, LMP 10/23/11 positive home tests concerned because went to [employer] yesterday, has taken Percocet and Tramadol for her back, drink alcohol

## 2011-12-03 NOTE — Discharge Instructions (Signed)
    ________________________________________ ° ° ° ° °To schedule your Maternity Eligibility Appointment, please call [phone].  When you arrive for your appointment you must bring the following items or information listed below.  Your appointment will be rescheduled if you do not have these items or are 15 minutes late. °If currently receiving Medicaid, you MUST bring: °1. Medicaid Card °2. Social Security Card °3. Picture ID °4. Proof of Pregnancy °5. Verification of current address if the address on Medicaid card is incorrect "postmarked mail" °If not receiving Medicaid, you MUST bring: °1. Social Security Card °2. Picture ID °3. Birth Certificate (if available) Passport or *Green Card °4. Proof of Pregnancy °5. Verification of current address "postmarked mail" for each income presented. °6. Verification of insurance coverage, if any °7. Check stubs from each employer for the previous month (if unable to present check stub  °for each week, we will accept check stub for the first and last week ill the same month.) If you can't locate check stubs, you must bring a letter from the employer(s) and it must have the following information on letterhead, typed, in English: °o name of company °o company telephone number °o how long been with the company, if less than one month °o how much person earns per hour °o how many hours per week work °o the gross pay the person earned for the previous month °If you are 23 years old or less, you do not have to bring proof of income unless you work or live with the father of the baby and at that time we will need proof of income from you and/or the father of the baby. °Green Card recipients are eligible for Medicaid for Pregnant Women (MPW) ° °Prenatal Care Providers °[facility]    [facility] ° Phone- [phone]     Phone: [phone] °         °[facility]                      [facility] ° @[place]     Phone: [phone] ° Phone: [phone] °        [facility] °[facility]     Phone: [phone] ° Phone: [phone]   °        [facility] °[facility] [place]                hone: [phone] ° Phone: [phone] °        [facility] °Dr. Bernard Marshall      Phone: [phone] ° Phone: [phone] °        [facility] °[facility]                Phone: [phone] ° Women’s Health  ° Phone:[phone]    [facility]) °         Phone: [phone] °[facility] °  Phone: [phone] °

## 2011-12-03 NOTE — MAU Provider Note (Signed)
Chief Complaint:  Nausea    First Provider Initiated Contact with Patient 12/03/11 1647      Michelle [name] is  21 y.o. [id number].  Patient's last menstrual period was 10/23/2011.Marland Kitchen  Her pregnancy status is positive.  She presents complaining of Nausea  "My breast have been very tender for the past few weeks. I got really nauseous when I smelled my boyfriend's food last night. I rode almost every roller coaster at [employer] yesterday. I drank a little and have been taking Tramadol and Percocet for my back and HCG diet drops. I just had my Mirena taken out about 3 days before my last normal period. I didn't think anything of missing my period because I have heard that it takes some time for the period to normalize after having an IUD removed. I just want to talk to a doctor to see what the effects of what I have been doing on this pregnancy. My other complaint is the extreme breast pain/tenderness."   Denies abd pain, vaginal bleeding, dysuria, vaginal discharge or back pain.   Obstetrical/Gynecological History: OB History    Grav Para Term Preterm Abortions TAB SAB Ect Mult Living   3 2 2       2       Past Medical History: Past Medical History  Diagnosis Date  . Tobacco abuse   . Bipolar I disorder   . PTSD (post-traumatic stress disorder)   . DM mellitus, gestational   . Post partum depression   . Menorrhagia   . 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 No pertinent past surgical history.  Family History: Family History  Problem Relation Age of Onset  . Breast cancer Paternal Grandmother   . Colon cancer Paternal Grandfather   . Colon cancer Maternal Grandfather   . Colon polyps Maternal Aunt   . Diabetes Mother     maternal aunts and uncles  . Diabetes Sister   . Diabetes Father   . Heart disease Father     Social History: History  Substance Use Topics  . Smoking status: Current Everyday Smoker -- 0.5  packs/day    Types: Cigarettes  . Smokeless tobacco: Never Used   Comment: decreased smoking   . Alcohol Use: Yes     occasional    Allergies:  Allergies  Allergen Reactions  . Codeine Anaphylaxis and Hives    Prescriptions prior to admission  Medication Sig Dispense Refill  . acetaminophen (TYLENOL) 325 MG tablet Take 650 mg by mouth every 6 (six) hours as needed. pain      . oxyCODONE-acetaminophen (PERCOCET) 5-325 MG per tablet Take 1 tablet by mouth every 8 (eight) hours as needed. pain      . traMADol (ULTRAM) 50 MG tablet Take 50 mg by mouth every 6 (six) hours as needed. pain      . levonorgestrel (MIRENA) 20 MCG/24HR IUD 1 each by Intrauterine route once.        Review of Systems - Negative except what has been reviewed in HPI  Physical Exam   Blood pressure 119/73, pulse 117, temperature 98.3 F (36.8 C), temperature source Oral, resp. rate 16, height 5\' 3"  (1.6 m), weight 138 lb (62.596 kg), last menstrual period 10/23/2011.  General: General appearance - alert, well appearing, and in no distress and oriented to person, place, and time Mental status - alert, oriented to person, place, and time, normal mood, behavior,  speech, dress, motor activity, and thought processes, affect appropriate to mood Mouth - mucous membranes moist, pharynx normal without lesions and multiple piercings of lips Abdomen - soft, nontender, nondistended, no masses or organomegaly Skin - normal coloration and turgor, no rashes, no suspicious skin lesions noted Multiple piercing and tattos Focused Gynecological Exam: examination not indicated  Labs: Recent Results (from the past 24 hour(s))  URINALYSIS, ROUTINE W REFLEX MICROSCOPIC   Collection Time   12/03/11  4:10 PM      Component Value Range   Color, Urine YELLOW  YELLOW    APPearance CLEAR  CLEAR    Specific Gravity, Urine 1.015  1.005 - 1.030    pH 7.5  5.0 - 8.0    Glucose, UA NEGATIVE  NEGATIVE (mg/dL)   Hgb urine dipstick  NEGATIVE  NEGATIVE    Bilirubin Urine NEGATIVE  NEGATIVE    Ketones, ur 40 (*) NEGATIVE (mg/dL)   Protein, ur NEGATIVE  NEGATIVE (mg/dL)   Urobilinogen, UA 0.2  0.0 - 1.0 (mg/dL)   Nitrite NEGATIVE  NEGATIVE    Leukocytes, UA NEGATIVE  NEGATIVE   POCT PREGNANCY, URINE   Collection Time   12/03/11  4:21 PM      Component Value Range   Preg Test, Ur POSITIVE (*) NEGATIVE   HCG, QUANTITATIVE, PREGNANCY   Collection Time   12/03/11  4:50 PM      Component Value Range   hCG, Beta Chain, Quant, S 5114 (*) <5 (mIU/mL)  ABO/RH   Collection Time   12/03/11  4:55 PM      Component Value Range   ABO/RH(D) O POS     ED Course: Informal [place] at bedside. Interuterine gest sac with ? Yolk sac. Given no abd pain or bleeding. Will have patient FU for transvag US for dating this week.  Assessment: Positive Pregnancy  Plan: Discharge home FU this week for dating [place]  Elya Diloreto E. 12/03/2011,6:06 PM

## 2011-12-03 NOTE — MAU Note (Signed)
"  My breast have been very tender for the past few weeks. I got really nauseous when I smelled my boyfriend's food last night.  I rode almost every roller coaster at [employer] yesterday. I drank a little and have been taking Tramadol and Percocet for my back and HCG diet drops. I just had my Mirena taken out about 3 days before my last normal period. I didn't think anything of missing my period because I have heard that it takes some time for the period to normalize after having an IUD removed. I just want to talk to a doctor to see what the effects of what I have been doing on this pregnancy. My other complaint is the extreme breast pain/tenderness."

## 2011-12-04 ENCOUNTER — Telehealth: Payer: Self-pay | Admitting: Family Medicine

## 2011-12-04 NOTE — Telephone Encounter (Signed)
Patient has just found out that she is pregnant and is concerned because she is having severe cramps in her back.  She wants to be seen today if at all possible.

## 2011-12-04 NOTE — Telephone Encounter (Signed)
Returned call to patient.  C/o back pain that "feels like I've been in a car accident."  Denies any dysuria or abdominal pain.  No appts available this afternoon.  Informed patient she can go to urgent care to be evaluated or come here tomorrow morning for appt.  Patient states she will go to urgent care today.  Gaylene Brooks, RN

## 2011-12-04 NOTE — MAU Provider Note (Signed)
Agree with above note.  Michelle [name] 12/04/2011 6:18 AM

## 2011-12-07 ENCOUNTER — Ambulatory Visit (INDEPENDENT_AMBULATORY_CARE_PROVIDER_SITE_OTHER): Payer: Medicaid Other | Admitting: Family Medicine

## 2011-12-07 ENCOUNTER — Ambulatory Visit (HOSPITAL_COMMUNITY)
Admission: RE | Admit: 2011-12-07 | Discharge: 2011-12-07 | Disposition: A | Discharge status: Home / Self Care | Payer: Medicaid Other | Source: Ambulatory Visit | Attending: Physician Assistant | Admitting: Physician Assistant

## 2011-12-07 ENCOUNTER — Telehealth: Payer: Self-pay | Admitting: Family Medicine

## 2011-12-07 ENCOUNTER — Encounter: Payer: Self-pay | Admitting: Family Medicine

## 2011-12-07 VITALS — BP 109/66 | HR 76 | Ht 63.0 in | Wt 139.0 lb

## 2011-12-07 DIAGNOSIS — N939 Abnormal uterine and vaginal bleeding, unspecified: Secondary | ICD-10-CM

## 2011-12-07 DIAGNOSIS — N898 Other specified noninflammatory disorders of vagina: Secondary | ICD-10-CM

## 2011-12-07 DIAGNOSIS — O209 Hemorrhage in early pregnancy, unspecified: Secondary | ICD-10-CM

## 2011-12-07 DIAGNOSIS — Z3201 Encounter for pregnancy test, result positive: Secondary | ICD-10-CM

## 2011-12-07 DIAGNOSIS — O3680X0 Pregnancy with inconclusive fetal viability, not applicable or unspecified: Secondary | ICD-10-CM | POA: Insufficient documentation

## 2011-12-07 DIAGNOSIS — Z3689 Encounter for other specified antenatal screening: Secondary | ICD-10-CM | POA: Insufficient documentation

## 2011-12-07 NOTE — Progress Notes (Signed)
  Subjective:    Patient ID: Michelle [name], female    DOB: 09-05-1990, 21 y.o.   MRN: [id number]  HPI Vaginal bleeding during pregnacy: Pt is [redacted] weeks pregnant- has had vaginal spotting x 2 days now.  Concerned about her pregnancy.  Had a positive pregnancy test on Sunday. She went [facility] the emergent department at [facility] when she found out that she was pregnant because she was concerned do to the amount of alcohol she had drank the week before she cannot she was pregnant. Was concerned she had jeopardize the pregnancy. One to be checked out. Beta hCG was done and level was 5114.  Had ultrasound scheduled for today. This was within normal limits and consistent with a [redacted] week gestational age. Patient here today because of spotting x2 days. Would like to be checked out to make sure pregnancy is healthy. No dizziness. Positive occasional nausea. No vomiting. No abdominal pain. No diarrhea. No vaginal discharge. No fever.  Smoking status reviewed.  Review of Systems    as per above. Objective:   Physical Exam  Constitutional: She appears well-developed and well-nourished.  HENT:  Head: Normocephalic and atraumatic.  Cardiovascular: Normal rate.   Pulmonary/Chest: Effort normal. No respiratory distress.  Abdominal: Soft. She exhibits no distension and no mass. There is no tenderness. There is no rebound and no guarding.  Genitourinary: Vagina normal and uterus normal. There is no rash, tenderness, lesion or injury on the right labia. There is no rash, tenderness, lesion or injury on the left labia. Cervix exhibits no motion tenderness, no discharge and no friability. Right adnexum displays no mass, no tenderness and no fullness. Left adnexum displays no mass, no tenderness and no fullness.            Assessment & Plan:

## 2011-12-07 NOTE — Telephone Encounter (Signed)
Michelle [name] needs New Ob appt set up for next week.  They are waiting for labs to come back tomorrow.

## 2011-12-08 ENCOUNTER — Telehealth: Payer: Self-pay | Admitting: Family Medicine

## 2011-12-08 DIAGNOSIS — O209 Hemorrhage in early pregnancy, unspecified: Secondary | ICD-10-CM | POA: Insufficient documentation

## 2011-12-08 LAB — HCG, QUANTITATIVE, PREGNANCY: hCG, Beta Chain, Quant, S: 18813.1 m[IU]/mL

## 2011-12-08 NOTE — Assessment & Plan Note (Signed)
Will repeat BHCG today to ensure that it is increasing appropriately.  Cervical os closed, abd exam wnl, U/S that was done this AM (ordered by MAU when pt went for eval 4-5 days ago when she learned she was pregant).  This shows intrauterine pregnancy consistent with 21 weeks of age- correlates with EDD from LMP.  Pt to return early next week for recheck of BHCG.  If any new or worsening of symptoms over the weekend pt is to go to MAU.  Reviewed red flags with patient.

## 2011-12-08 NOTE — Telephone Encounter (Signed)
Reviewed pt lab this am.  I am out of the office today- will ask Katrinka Blazing, RN to call patient and let her know that the lab work shows that the [facility] level is increasing appropriately- this is a good sign that the pregnancy is healthy, but we will need to continue to monitor.  Will recheck blood work and discuss in more detail at appointment next week.  Thanks so much for your help calling this patient.  Thanks, D. Edmonia James, MD

## 2011-12-08 NOTE — Telephone Encounter (Signed)
Dr. Edmonia James has no open slots for next week.  I scheduled Michelle [name] an appointment for 6/7 @ 9:00 for follow up only.  We can do her prenatal labs at that visit and then I will scheduled her NOB appointment at that time.  Jule notified.  Ileana Ladd

## 2011-12-08 NOTE — Telephone Encounter (Signed)
Patient notified

## 2011-12-10 ENCOUNTER — Inpatient Hospital Stay (HOSPITAL_COMMUNITY): Payer: Medicaid Other

## 2011-12-10 ENCOUNTER — Encounter (HOSPITAL_COMMUNITY): Payer: Self-pay | Admitting: *Deleted

## 2011-12-10 ENCOUNTER — Inpatient Hospital Stay (HOSPITAL_COMMUNITY)
Admission: AD | Admit: 2011-12-10 | Discharge: 2011-12-10 | Disposition: A | Discharge status: Home / Self Care | Payer: Medicaid Other | Source: Ambulatory Visit | Attending: Obstetrics & Gynecology | Admitting: Obstetrics & Gynecology

## 2011-12-10 DIAGNOSIS — O209 Hemorrhage in early pregnancy, unspecified: Secondary | ICD-10-CM | POA: Insufficient documentation

## 2011-12-10 DIAGNOSIS — O208 Other hemorrhage in early pregnancy: Secondary | ICD-10-CM

## 2011-12-10 DIAGNOSIS — O418X10 Other specified disorders of amniotic fluid and membranes, first trimester, not applicable or unspecified: Secondary | ICD-10-CM

## 2011-12-10 DIAGNOSIS — O468X1 Other antepartum hemorrhage, first trimester: Secondary | ICD-10-CM

## 2011-12-10 HISTORY — DX: Gestational diabetes mellitus in pregnancy, unspecified control: O24.419

## 2011-12-10 NOTE — MAU Provider Note (Signed)
History     CSN: [id number]  Arrival date and time: 12/10/11 1625   First Provider Initiated Contact with Patient 12/10/11 1650      No chief complaint on file.  HPI Michelle [name] is 21 y.o. [phone] [redacted]w[redacted]d weeks presenting with hx of a "gush of blood" see when urinating shortly before arrival.  Had ultrasound on 5/23 that showed small [facility] with living IUP at [redacted]w[redacted]d.  Has mild lower abdominal pain.      Past Medical History  Diagnosis Date  . Tobacco abuse   . Bipolar I disorder   . PTSD (post-traumatic stress disorder)   . DM mellitus, gestational   . Post partum depression   . Menorrhagia   . 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 Gestational diabetes     Past Surgical History  Procedure Date  . No past surgeries     Family History  Problem Relation Age of Onset  . Breast cancer Paternal Grandmother   . Colon cancer Paternal Grandfather   . Colon cancer Maternal Grandfather   . Colon polyps Maternal Aunt   . Diabetes Mother     maternal aunts and uncles  . Diabetes Sister   . Diabetes Father   . Heart disease Father     History  Substance Use Topics  . Smoking status: Current Everyday Smoker -- 0.5 packs/day    Types: Cigarettes  . Smokeless tobacco: Never Used   Comment: decreased smoking   . Alcohol Use: No     occasional    Allergies:  Allergies  Allergen Reactions  . Codeine Anaphylaxis and Hives    Prescriptions prior to admission  Medication Sig Dispense Refill  . acetaminophen (TYLENOL) 325 MG tablet Take 650 mg by mouth every 6 (six) hours as needed. pain        Review of Systems  Constitutional: Negative.   Respiratory: Negative.   Cardiovascular: Negative.   Gastrointestinal: Positive for abdominal pain (lower, mid abdominal pain-mild).  Genitourinary:       + for gush of vaginal blood   Physical Exam   Blood pressure 120/78, pulse 117, temperature 98.2 F (36.8 C), temperature source Oral, last menstrual period  10/23/2011.  Physical Exam  Constitutional: She is oriented to person, place, and time. She appears well-developed and well-nourished. No distress.       worried  HENT:  Head: Normocephalic.  Neck: Normal range of motion.  Cardiovascular: Normal rate.   Respiratory: Effort normal.  GI: Soft. She exhibits no distension and no mass. There is no tenderness. There is no rebound and no guarding.  Genitourinary:       Speculum exam Vulva neg Vagina small amount of bright red bleeding Cervix nontender, nonfriable, closed Uterus nontender, slightly enlarged Adnexa bilaterally clear, nontender Chaperone present for exam  Neurological: She is alert and oriented to person, place, and time.  Skin: Skin is warm and dry.  Psychiatric: She has a normal mood and affect. Her behavior is normal.       *RADIOLOGY REPORT*  Clinical Data: Vaginal bleeding  OBSTETRIC <14 WK ULTRASOUND  Technique: Transvaginal ultrasound was performed for evaluation  of the gestation as well as the maternal uterus and adnexal  regions.  Comparison: 12/07/2011  Intrauterine gestational sac: Visualized/normal in shape.  Yolk sac: Present  Embryo: Intrauterine  Cardiac Activity: Present  Heart Rate: 122 bpm  CRL: 7.4 mm 6w 5d [place] EDC: 07/30/2012  Maternal uterus/Adnexae:  A large subchorionic hemorrhage has increased in size since the  prior scan 3 days ago. Cross-sectional measurements today are 2.9 x  1.6 x 2.8 cm. Normal right ovary with corpus luteum cyst  redemonstrated. Normal left ovary unchanged. No free fluid is  present.  IMPRESSION:  Single viable intrauterine fetus with heart rate of 122 beats per  minute, with estimated gestational age of [redacted] weeks 5 days. EDC is  07/30/2012.  Large subchorionic hemorrhage has developed since the previous  study on 12/07/11 measuring 2.9 x 1.6 x 2.8 cm. Continued  surveillance is warranted.  Original Report Authenticated By: Elsie Stain, M.D.    MAU Course    Procedures  MDM   Assessment and Plan  A:  Vaginal bleeding in early pregnancy--[redacted]w[redacted]d      Subchorionic hemorrhage larger today than seen on U/S 2 days ago  P:  Pelvic rest      Bleeding precautions given, explained increase in size of [facility] to patient.       Return for increased bleeding or increased pain.        Taro Hidrogo,EVE M 12/10/2011, 4:54 PM

## 2011-12-10 NOTE — MAU Note (Signed)
Pt reports  she went to the bathroom and a "pool of Blood" came out of her. She has been having sharp abd pain off and on all day.

## 2011-12-10 NOTE — Discharge Instructions (Signed)
Vaginal Bleeding During Pregnancy  A small amount of bleeding from the vagina can happen anytime during pregnancy. Be sure to tell your doctor about all vaginal bleeding.   HOME CARE   Get plenty of rest and sleep.   Count the number of pads you use each day. Do not use tampons.   Save any tissue you pass for your doctor to see.   Do not exercise   Do not do any heavy lifting.   Avoid going up and down stairs. If you must climb stairs, go slowly.   Do not have sex (intercourse) or orgasms until approved by your doctor.   Do not douche.   Only take medicine as told by your doctor. Do not take aspirin.   Eat healthy.   Always keep your follow-up appointments.  GET HELP RIGHT AWAY IF:    You feel the baby moving less or not moving at all.   The bleeding gets worse.   You have very painful cramps or pain in your stomach or back.   You pass large clots or anything that looks like tissue.   You have a temperature by mouth above 102 F (38.9 C).   You feel very weak.   You have chills.   You feel dizzy or pass out (faint).   You have a gush of fluid from the vagina.  MAKE SURE YOU:    Understand these instructions.   Will watch your condition.   Will get help right away if you are not doing well or get worse.  Document Released: 04/11/2008 Document Revised: 06/22/2011 Document Reviewed: 06/08/2009  [employer] Patient Information 2012 [employer].

## 2011-12-22 ENCOUNTER — Encounter: Payer: Self-pay | Admitting: Family Medicine

## 2011-12-22 ENCOUNTER — Ambulatory Visit (INDEPENDENT_AMBULATORY_CARE_PROVIDER_SITE_OTHER): Payer: Medicaid Other | Admitting: Family Medicine

## 2011-12-22 VITALS — BP 105/65 | HR 89 | Ht 63.0 in | Wt 142.0 lb

## 2011-12-22 DIAGNOSIS — O209 Hemorrhage in early pregnancy, unspecified: Secondary | ICD-10-CM

## 2011-12-22 NOTE — Progress Notes (Signed)
  Subjective:    Patient ID: Michelle [name], female    DOB: 1991/01/01, 21 y.o.   MRN: [id number]  HPI Vaginal bleeding followup: Seen on May 23 for vaginal bleeding. Had ultrasound on May 23 that showed subchorionic hemorrhage. No bleeding between May 23 to May 26. Beta hCG showed appropriate increases suggesting viable pregnancy. On May 26 patient had another episode of bleeding. Went MAU had ultrasound repeat. Showed larger subchorionic hemorrhage. Patient told to followup here with me. No further bleeding since May 26. No abdominal pain. No fever. No cramping. No problems with bowel or urination. No back pain.  Smoking status reviewed.   Review of Systems As per above.    Objective:   Physical Exam  Constitutional: She appears well-developed and well-nourished.  HENT:  Head: Normocephalic and atraumatic.  Eyes: Pupils are equal, round, and reactive to light.  Cardiovascular: Normal rate, regular rhythm and normal heart sounds.   No murmur heard. Pulmonary/Chest: Effort normal and breath sounds normal. No respiratory distress.  Abdominal: Soft. She exhibits no distension and no mass. There is no tenderness. There is no rebound and no guarding.  Musculoskeletal: She exhibits no edema.  Neurological: She is alert.  Skin: No rash noted.  Psychiatric: She has a normal mood and affect.          Assessment & Plan:

## 2011-12-25 NOTE — Assessment & Plan Note (Signed)
Vaginal bleeding most likely caused by [facility] as seen in [place] at [facility] on 5/23 and 5/26. Called OB/GYN attending on call- Dr. Debroah Loop and discussed case.  No further lab work or imaging recommended in the setting of no further vaginal bleeding.  Will resume regular prenatal care- New OB appointment to be scheduled for the next couple of weeks.  Reviewed red flags with pt.  Pt to return sooner if any further vaginal bleeding or new concerning symptoms.

## 2012-01-02 ENCOUNTER — Telehealth: Payer: Self-pay | Admitting: Family Medicine

## 2012-01-02 NOTE — Telephone Encounter (Signed)
Patient is [redacted] weeks pregnant and asking if she can take Tramadol for back pain.  This was a Rx given to her prior to pregnancy.  Because patient is still in first trimester, I advised against Tramadol and to take Tylenol with heating or ice pads instead.  Tramadol is Category C, but because first trimester is so critical, I would rather patient start with Tylenol and follow up with PCP as needed.

## 2012-01-06 ENCOUNTER — Encounter (HOSPITAL_COMMUNITY): Payer: Self-pay | Admitting: Emergency Medicine

## 2012-01-06 ENCOUNTER — Emergency Department (HOSPITAL_COMMUNITY)
Admission: EM | Admit: 2012-01-06 | Discharge: 2012-01-06 | Disposition: A | Discharge status: Home / Self Care | Payer: Medicaid Other | Attending: Emergency Medicine | Admitting: Emergency Medicine

## 2012-01-06 DIAGNOSIS — S99921A Unspecified injury of right foot, initial encounter: Secondary | ICD-10-CM

## 2012-01-06 DIAGNOSIS — M79609 Pain in unspecified limb: Secondary | ICD-10-CM | POA: Insufficient documentation

## 2012-01-06 DIAGNOSIS — W208XXA Other cause of strike by thrown, projected or falling object, initial encounter: Secondary | ICD-10-CM | POA: Insufficient documentation

## 2012-01-06 NOTE — ED Notes (Signed)
Pt. Noted not in her room, triage, waiting room, POD A/B/C/D or radiology.  Will continue to look for pt.

## 2012-01-06 NOTE — ED Notes (Signed)
Patient not in assigned room. Patient not in radiology.  During admission, patient was stating she'd wanted to leave because she had a "birthday party" she wanted to "go to." MD notified. Consulting civil engineer notified.

## 2012-01-06 NOTE — ED Notes (Signed)
Pt. Stated, i was carrying a car seat with groceries and it fell on my rt. Foot the car seat.

## 2012-01-06 NOTE — ED Provider Notes (Signed)
History     CSN: [id number]  Arrival date & time 01/06/12  1001   First MD Initiated Contact with Patient 01/06/12 1055      Chief Complaint  Patient presents with  . Foot Pain    (Consider location/radiation/quality/duration/timing/severity/associated sxs/prior treatment) HPI  patient is a 21 year old female who presents today complaining of 5/10 sharp throbbing pain over the dorsum of the right foot which began earlier today after the patient dropped a car seat holding a bag of groceries on her foot. Patient says the pain is worse with movement, palpation, and walking. She also reports that she is alert and weeks' pregnant. Patient took Tylenol at home with no relief. She has not tried ice. Patient has no prior injury to the foot. There are no other associated or modifying factors. Past Medical History  Diagnosis Date  . Tobacco abuse   . Bipolar I disorder   . PTSD (post-traumatic stress disorder)   . Post partum depression   . Menorrhagia   . 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 Diabetes mellitus   . Pregnant     Past Surgical History  Procedure Date  . No past surgeries     Family History  Problem Relation Age of Onset  . Breast cancer Paternal Grandmother   . Colon cancer Paternal Grandfather   . Colon cancer Maternal Grandfather   . Colon polyps Maternal Aunt   . Diabetes Mother     maternal aunts and uncles  . Diabetes Sister   . Diabetes Father   . Heart disease Father     History  Substance Use Topics  . Smoking status: Current Everyday Smoker -- 0.5 packs/day    Types: Cigarettes  . Smokeless tobacco: Never Used   Comment: decreased smoking   . Alcohol Use: No     occasional    OB History    Grav Para Term Preterm Abortions TAB SAB Ect Mult Living   3 2 2       2       Review of Systems  Constitutional: Negative.   HENT: Negative.   Eyes: Negative.   Respiratory: Negative.   Cardiovascular: Negative.     Gastrointestinal: Negative.   Genitourinary: Negative.   Musculoskeletal:       Right foot pain as mentioned in the history of present illness.  Skin: Negative.   Neurological: Negative.   Hematological: Negative.   Psychiatric/Behavioral: Negative.   All other systems reviewed and are negative.    Allergies  Codeine  Home Medications   Current Outpatient Rx  Name Route Sig Dispense Refill  . PRENATAL MULTIVITAMIN CH Oral Take 1 tablet by mouth daily.      BP 99/71  Pulse 94  Temp 97.9 F (36.6 C) (Oral)  Resp 18  SpO2 98%  LMP 10/23/2011  Physical Exam  Nursing note and vitals reviewed. GEN: Well-developed, well-nourished female in no distress HEENT: Atraumatic, normocephalic. EYES: PERRLA BL, no scleral icterus. NECK: Trachea midline, no meningismus CV: regular rate and rhythm.  PULM: No respiratory distress.   Neuro: cranial nerves grossly 2-12 intact, no abnormalities of strength or sensation, A and O x 3 MSK: Tenderness to palpation over the dorsum of the right foot with absolutely no deformity, edema, or ecchymosis noted. No tenderness palpation of the ankle. Otherwise musculoskeletal exam is within normal limits.  Skin: No rashes petechiae, purpura, or jaundice Psych: no abnormality of mood   ED Course  Procedures (including critical care time)  Labs Reviewed - No data to display No results found.   1. Right foot injury       MDM  Patient was evaluated by myself. Based on presentation patient was given ice and an clean, the foot was ordered. Patient had already taken Tylenol prior to presentation. She did not have any significant deformity. Patient then eloped and did not notify medical staff. Multiple attempts were made to call the patient from the lobby in case she left. We were unable to locate the patient. Had she stayed we would've performed a plain film of the right foot with shielding of her belly. If the knee injury had been found we would've  manage this appropriately. Patient did not stay for this and therefore cannot rule out the possibility of a bony injury today.        Cyndra Numbers, MD 01/06/12 2106

## 2012-01-09 ENCOUNTER — Other Ambulatory Visit: Payer: Medicaid Other

## 2012-01-09 DIAGNOSIS — Z331 Pregnant state, incidental: Secondary | ICD-10-CM

## 2012-01-09 LAB — HIV ANTIBODY (ROUTINE TESTING W REFLEX): HIV: NONREACTIVE

## 2012-01-09 NOTE — Progress Notes (Signed)
PRENATAL LABS DONE TODAY,PT COULD NOT GIVE [place] A URINE CULTURE TODAY. Michelle [name]

## 2012-01-10 LAB — OBSTETRIC PANEL
Antibody Screen: NEGATIVE
Basophils Absolute: 0 10*3/uL (ref 0.0–0.1)
Basophils Relative: 0 % (ref 0–1)
Eosinophils Absolute: 0.1 10*3/uL (ref 0.0–0.7)
Eosinophils Relative: 1 % (ref 0–5)
HCT: 35.7 % — ABNORMAL LOW (ref 36.0–46.0)
Hemoglobin: 12.3 g/dL (ref 12.0–15.0)
Hepatitis B Surface Ag: NEGATIVE
Lymphocytes Relative: 26 % (ref 12–46)
Lymphs Abs: 3 10*3/uL (ref 0.7–4.0)
MCH: 31.5 pg (ref 26.0–34.0)
MCHC: 34.5 g/dL (ref 30.0–36.0)
MCV: 91.5 fL (ref 78.0–100.0)
Monocytes Absolute: 0.7 10*3/uL (ref 0.1–1.0)
Monocytes Relative: 6 % (ref 3–12)
Neutro Abs: 7.7 10*3/uL (ref 1.7–7.7)
Neutrophils Relative %: 67 % (ref 43–77)
Platelets: 172 10*3/uL (ref 150–400)
RBC: 3.9 MIL/uL (ref 3.87–5.11)
RDW: 12.8 % (ref 11.5–15.5)
Rh Type: POSITIVE
Rubella: 9.1 IU/mL — ABNORMAL HIGH
WBC: 11.4 10*3/uL — ABNORMAL HIGH (ref 4.0–10.5)

## 2012-01-10 LAB — SICKLE CELL SCREEN: Sickle Cell Screen: NEGATIVE

## 2012-01-11 ENCOUNTER — Encounter: Payer: Self-pay | Admitting: Family Medicine

## 2012-01-16 ENCOUNTER — Encounter: Payer: Medicaid Other | Admitting: Family Medicine

## 2012-01-21 ENCOUNTER — Encounter (HOSPITAL_COMMUNITY): Payer: Self-pay | Admitting: Obstetrics and Gynecology

## 2012-01-21 ENCOUNTER — Inpatient Hospital Stay (HOSPITAL_COMMUNITY)
Admission: AD | Admit: 2012-01-21 | Discharge: 2012-01-21 | Disposition: A | Discharge status: Home / Self Care | Payer: Medicaid Other | Source: Ambulatory Visit | Attending: Obstetrics & Gynecology | Admitting: Obstetrics & Gynecology

## 2012-01-21 DIAGNOSIS — R1013 Epigastric pain: Secondary | ICD-10-CM | POA: Insufficient documentation

## 2012-01-21 DIAGNOSIS — O99891 Other specified diseases and conditions complicating pregnancy: Secondary | ICD-10-CM | POA: Insufficient documentation

## 2012-01-21 LAB — COMPREHENSIVE METABOLIC PANEL
ALT: 7 U/L (ref 0–35)
AST: 12 U/L (ref 0–37)
Albumin: 3 g/dL — ABNORMAL LOW (ref 3.5–5.2)
Alkaline Phosphatase: 65 U/L (ref 39–117)
BUN: 8 mg/dL (ref 6–23)
CO2: 24 mEq/L (ref 19–32)
Calcium: 8.9 mg/dL (ref 8.4–10.5)
Chloride: 103 mEq/L (ref 96–112)
Creatinine, Ser: 0.54 mg/dL (ref 0.50–1.10)
GFR calc Af Amer: >90 mL/min (ref >90)
GFR calc non Af Amer: >90 mL/min (ref >90)
Glucose, Bld: 95 mg/dL (ref 70–99)
Potassium: 3.9 mEq/L (ref 3.5–5.1)
Sodium: 136 mEq/L (ref 135–145)
Total Bilirubin: 0.1 mg/dL — ABNORMAL LOW (ref 0.3–1.2)
Total Protein: 6.2 g/dL (ref 6.0–8.3)

## 2012-01-21 LAB — CBC WITH DIFFERENTIAL/PLATELET
Basophils Absolute: 0 10*3/uL (ref 0.0–0.1)
Basophils Relative: 0 % (ref 0–1)
Eosinophils Absolute: 0.1 10*3/uL (ref 0.0–0.7)
Eosinophils Relative: 1 % (ref 0–5)
HCT: 34.1 % — ABNORMAL LOW (ref 36.0–46.0)
Hemoglobin: 11.7 g/dL — ABNORMAL LOW (ref 12.0–15.0)
Lymphocytes Relative: 22 % (ref 12–46)
Lymphs Abs: 3 10*3/uL (ref 0.7–4.0)
MCH: 31.5 pg (ref 26.0–34.0)
MCHC: 34.3 g/dL (ref 30.0–36.0)
MCV: 91.7 fL (ref 78.0–100.0)
Monocytes Absolute: 0.8 10*3/uL (ref 0.1–1.0)
Monocytes Relative: 6 % (ref 3–12)
Neutro Abs: 9.7 10*3/uL — ABNORMAL HIGH (ref 1.7–7.7)
Neutrophils Relative %: 71 % (ref 43–77)
Platelets: 160 10*3/uL (ref 150–400)
RBC: 3.72 MIL/uL — ABNORMAL LOW (ref 3.87–5.11)
RDW: 11.8 % (ref 11.5–15.5)
WBC: 13.7 10*3/uL — ABNORMAL HIGH (ref 4.0–10.5)

## 2012-01-21 LAB — LIPASE, BLOOD: Lipase: 14 U/L (ref 11–59)

## 2012-01-21 MED ORDER — GI COCKTAIL ~~LOC~~
30.0000 mL | Freq: Once | ORAL | Status: AC
  Administered 2012-01-21: 30 mL via ORAL
  Filled 2012-01-21: qty 30

## 2012-01-21 MED ORDER — RANITIDINE HCL 150 MG PO CAPS: 150.0000 mg | ORAL_CAPSULE | Freq: Every day | ORAL | Status: DC

## 2012-01-21 MED ORDER — FAMOTIDINE 20 MG PO TABS
20.0000 mg | ORAL_TABLET | Freq: Once | ORAL | Status: AC
  Administered 2012-01-21: 20 mg via ORAL
  Filled 2012-01-21: qty 1

## 2012-01-21 NOTE — MAU Note (Signed)
Pt encouraged to provide Urine sample. Pt says she is unable to urinate at this time.

## 2012-01-21 NOTE — MAU Provider Note (Signed)
Attestation of Attending Supervision of Advanced Practitioner (CNM/NP): Evaluation and management procedures were performed by the Advanced Practitioner under my supervision and collaboration.  I have reviewed the Advanced Practitioner's note and chart, and I agree with the management and plan.  Jaynie Collins, M.D. 01/21/2012 12:25 PM

## 2012-01-21 NOTE — MAU Note (Signed)
Pt presents to MAU with chief complaint of upper abdominal pain that started 2 days ago. Pt is G3P2, [redacted]w[redacted]d; Pt describes the pain as intermittent, sharp, when it comes it lasts for 2-3 hours. Pt has tried tums and has had no relief.

## 2012-01-21 NOTE — MAU Provider Note (Signed)
History     CSN: [id number]  Arrival date & time 01/21/12  0948   None     Chief Complaint  Patient presents with  . Abdominal Pain    HPI Michelle [name] is a 21 y.o. female @ [redacted]w[redacted]d gestation who presents to MAU for abdominal pain. The pain is located in the upper abdomen. The pain started 3 days ago. The symptoms increase with lying flat and after eating at [facility], [employer]  and eating [place] food. Associated symptoms include headache and back pain. Denies UTI symptoms, denies nausea, vomiting or other problems. Has taken tylenol for pain without relief of epigastric pain but did help headache. The history was provided by the patient.  Past Medical History  Diagnosis Date  . Tobacco abuse   . Bipolar I disorder   . PTSD (post-traumatic stress disorder)   . Post partum depression   . Menorrhagia   . 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 Diabetes mellitus   . Pregnant     Past Surgical History  Procedure Date  . No past surgeries     Family History  Problem Relation Age of Onset  . Breast cancer Paternal Grandmother   . Colon cancer Paternal Grandfather   . Colon cancer Maternal Grandfather   . Colon polyps Maternal Aunt   . Diabetes Mother     maternal aunts and uncles  . Diabetes Sister   . Diabetes Father   . Heart disease Father     History  Substance Use Topics  . Smoking status: Current Everyday Smoker -- 0.5 packs/day    Types: Cigarettes  . Smokeless tobacco: Never Used   Comment: decreased smoking   . Alcohol Use: No     occasional    OB History    Grav Para Term Preterm Abortions TAB SAB Ect Mult Living   3 2 2       2       Review of Systems  Constitutional: Negative for fever, chills, diaphoresis and fatigue.  HENT: Negative for ear pain, congestion, sore throat, facial swelling, neck pain, neck stiffness, dental problem and sinus pressure.   Eyes: Negative for photophobia, pain, discharge and visual disturbance.    Respiratory: Positive for wheezing (smoker). Negative for cough and chest tightness.   Cardiovascular: Negative for chest pain, palpitations and leg swelling.  Gastrointestinal: Positive for abdominal pain. Negative for nausea, vomiting, diarrhea, constipation and abdominal distention.  Genitourinary: Negative for dysuria, frequency, flank pain, vaginal bleeding, vaginal discharge, difficulty urinating and pelvic pain.  Musculoskeletal: Positive for back pain. Negative for myalgias and gait problem.  Skin: Negative for color change and rash.  Neurological: Positive for headaches. Negative for dizziness, speech difficulty, weakness, light-headedness and numbness.  Psychiatric/Behavioral: Negative for confusion and agitation. Nervous/anxious: bipolar disorder, no on any medication.     Allergies  Codeine  Home Medications  No current outpatient prescriptions on file.  BP 111/64  Pulse 102  Temp 98.2 F (36.8 C) (Oral)  Resp 18  LMP 10/23/2011  Physical Exam  Nursing note and vitals reviewed. Constitutional: She is oriented to person, place, and time. She appears well-developed and well-nourished. No distress.  HENT:  Head: Normocephalic.  Eyes: EOM are normal.  Neck: Neck supple.  Cardiovascular: Normal rate.        tachycardia  Pulmonary/Chest: Effort normal.  Abdominal: Soft. There is tenderness in the epigastric area. There is no rigidity, no rebound, no  guarding and no CVA tenderness.       Positive FHT's.  Musculoskeletal: Normal range of motion.  Neurological: She is alert and oriented to person, place, and time. No cranial nerve deficit.  Skin: Skin is warm and dry.  Psychiatric: She has a normal mood and affect. Her behavior is normal. Judgment and thought content normal.   Results for orders placed during the hospital encounter of 01/21/12 (from the past 24 hour(s))  CBC WITH DIFFERENTIAL     Status: Abnormal   Collection Time   01/21/12 10:49 AM      Component  Value Range   WBC 13.7 (*) 4.0 - 10.5 K/uL   RBC 3.72 (*) 3.87 - 5.11 MIL/uL   Hemoglobin 11.7 (*) 12.0 - 15.0 g/dL   HCT 11.9 (*) 14.7 - 82.9 %   MCV 91.7  78.0 - 100.0 fL   MCH 31.5  26.0 - 34.0 pg   MCHC 34.3  30.0 - 36.0 g/dL   RDW 56.2  13.0 - 86.5 %   Platelets 160  150 - 400 K/uL   Neutrophils Relative 71  43 - 77 %   Neutro Abs 9.7 (*) 1.7 - 7.7 K/uL   Lymphocytes Relative 22  12 - 46 %   Lymphs Abs 3.0  0.7 - 4.0 K/uL   Monocytes Relative 6  3 - 12 %   Monocytes Absolute 0.8  0.1 - 1.0 K/uL   Eosinophils Relative 1  0 - 5 %   Eosinophils Absolute 0.1  0.0 - 0.7 K/uL   Basophils Relative 0  0 - 1 %   Basophils Absolute 0.0  0.0 - 0.1 K/uL  COMPREHENSIVE METABOLIC PANEL     Status: Abnormal   Collection Time   01/21/12 10:49 AM      Component Value Range   Sodium 136  135 - 145 mEq/L   Potassium 3.9  3.5 - 5.1 mEq/L   Chloride 103  96 - 112 mEq/L   CO2 24  19 - 32 mEq/L   Glucose, Bld 95  70 - 99 mg/dL   BUN 8  6 - 23 mg/dL   Creatinine, Ser 7.84  0.50 - 1.10 mg/dL   Calcium 8.9  8.4 - 69.6 mg/dL   Total Protein 6.2  6.0 - 8.3 g/dL   Albumin 3.0 (*) 3.5 - 5.2 g/dL   AST 12  0 - 37 U/L   ALT 7  0 - 35 U/L   Alkaline Phosphatase 65  39 - 117 U/L   Total Bilirubin 0.1 (*) 0.3 - 1.2 mg/dL   GFR calc non Af Amer >90  >90 mL/min   GFR calc Af Amer >90  >90 mL/min  LIPASE, BLOOD     Status: Normal   Collection Time   01/21/12 10:49 AM      Component Value Range   Lipase 14  11 - 59 U/L   Re evaluation: After GI Cocktail and pepcid the patient is feeling much better. She now rates her pain as 2/10. States she wants to go home and get some sleep now.  Assessment: 21 y.o. @ [redacted]w[redacted]d with epigastric pain  Plan:  Zantac Rx   Follow up with OB for continued prenatal care   Return as needed.  I have discussed with the patient results of labs and clinical finding and need for change in diet. Patient voices understanding. ED Course  Procedures   MDM

## 2012-01-26 ENCOUNTER — Ambulatory Visit: Payer: Medicaid Other | Admitting: Sports Medicine

## 2012-01-26 ENCOUNTER — Encounter: Payer: Self-pay | Admitting: Sports Medicine

## 2012-01-26 ENCOUNTER — Other Ambulatory Visit (HOSPITAL_COMMUNITY)
Admission: RE | Admit: 2012-01-26 | Discharge: 2012-01-26 | Disposition: A | Discharge status: Home / Self Care | Payer: Medicaid Other | Source: Ambulatory Visit | Attending: Family Medicine | Admitting: Family Medicine

## 2012-01-26 ENCOUNTER — Encounter: Payer: Medicaid Other | Admitting: Sports Medicine

## 2012-01-26 VITALS — BP 111/67 | Temp 98.5°F | Wt 142.0 lb

## 2012-01-26 DIAGNOSIS — Z113 Encounter for screening for infections with a predominantly sexual mode of transmission: Secondary | ICD-10-CM | POA: Insufficient documentation

## 2012-01-26 DIAGNOSIS — R8789 Other abnormal findings in specimens from female genital organs: Secondary | ICD-10-CM

## 2012-01-26 DIAGNOSIS — Z3490 Encounter for supervision of normal pregnancy, unspecified, unspecified trimester: Secondary | ICD-10-CM

## 2012-01-26 DIAGNOSIS — Z01419 Encounter for gynecological examination (general) (routine) without abnormal findings: Secondary | ICD-10-CM | POA: Insufficient documentation

## 2012-01-26 LAB — OB RESULTS CONSOLE GC/CHLAMYDIA
Chlamydia: NEGATIVE
Gonorrhea: NEGATIVE

## 2012-01-26 NOTE — Patient Instructions (Addendum)
Follow up next week for you lab tests. Follow up in 2.5 weeks for your 16 week appointment  ABCs of Pregnancy A Antepartum care is very important. Be sure you see your doctor and get prenatal care as soon as you think you are pregnant. At this time, you will be tested for infection, genetic abnormalities and potential problems with you and the pregnancy. This is the time to discuss diet, exercise, work, medications, labor, pain medication during labor and the possibility of a cesarean delivery. Ask any questions that may concern you. It is important to see your doctor regularly throughout your pregnancy. Avoid exposure to toxic substances and chemicals - such as cleaning solvents, lead and mercury, some insecticides, and paint. Pregnant women should avoid exposure to paint fumes, and fumes that cause you to feel ill, dizzy or faint. When possible, it is a good idea to have a pre-pregnancy consultation with your caregiver to begin some important recommendations your caregiver suggests such as, taking folic acid, exercising, quitting smoking, avoiding alcoholic beverages, etc. B Breastfeeding is the healthiest choice for both you and your baby. It has many nutritional benefits for the baby and health benefits for the mother. It also creates a very tight and loving bond between the baby and mother. Talk to your doctor, your family and friends, and your employer about how you choose to feed your baby and how they can support you in your decision. Not all birth defects can be prevented, but a woman can take actions that may increase her chance of having a healthy baby. Many birth defects happen very early in pregnancy, sometimes before a woman even knows she is pregnant. Birth defects or abnormalities of any child in your or the father's family should be discussed with your caregiver. Get a good support bra as your breast size changes. Wear it especially when you exercise and when nursing.  C Celebrate the news  of your pregnancy with the your spouse/father and family. Childbirth classes are helpful to take for you and the spouse/father because it helps to understand what happens during the pregnancy, labor and delivery. Cesarean delivery should be discussed with your doctor so you are prepared for that possibility. The pros and cons of circumcision if it is a boy, should be discussed with your pediatrician. Cigarette smoking during pregnancy can result in low birth weight babies. It has been associated with infertility, miscarriages, tubal pregnancies, infant death (mortality) and poor health (morbidity) in childhood. Additionally, cigarette smoking may cause long-term learning disabilities. If you smoke, you should try to quit before getting pregnant and not smoke during the pregnancy. Secondary smoke may also harm a mother and her developing baby. It is a good idea to ask people to stop smoking around you during your pregnancy and after the baby is born. Extra calcium is necessary when you are pregnant and is found in your prenatal vitamin, in dairy products, green leafy vegetables and in calcium supplements. D A healthy diet according to your current weight and height, along with vitamins and mineral supplements should be discussed with your caregiver. Domestic abuse or violence should be made known to your doctor right away to get the situation corrected. Drink more water when you exercise to keep hydrated. Discomfort of your back and legs usually develops and progresses from the middle of the second trimester through to delivery of the baby. This is because of the enlarging baby and uterus, which may also affect your balance. Do not take illegal drugs.  Illegal drugs can seriously harm the baby and you. Drink extra fluids (water is best) throughout pregnancy to help your body keep up with the increases in your blood volume. Drink at least 6 to 8 glasses of water, fruit juice, or milk each day. A good way to know you  are drinking enough fluid is when your urine looks almost like clear water or is very light yellow.  E Eat healthy to get the nutrients you and your unborn baby need. Your meals should include the five basic food groups. Exercise (30 minutes of light to moderate exercise a day) is important and encouraged during pregnancy, if there are no medical problems or problems with the pregnancy. Exercise that causes discomfort or dizziness should be stopped and reported to your caregiver. Emotions during pregnancy can change from being ecstatic to depression and should be understood by you, your partner and your family. F Fetal screening with ultrasound, amniocentesis and monitoring during pregnancy and labor is common and sometimes necessary. Take 400 micrograms of folic acid daily both before, when possible, and during the first few months of pregnancy to reduce the risk of birth defects of the brain and spine. All women who could possibly become pregnant should take a vitamin with folic acid, every day. It is also important to eat a healthy diet with fortified foods (enriched grain products, including cereals, rice, breads, and pastas) and foods with natural sources of folate (orange juice, green leafy vegetables, beans, peanuts, broccoli, asparagus, peas, and lentils). The father should be involved with all aspects of the pregnancy including, the prenatal care, childbirth classes, labor, delivery, and postpartum time. Fathers may also have emotional concerns about being a father, financial needs, and raising a family. G Genetic testing should be done appropriately. It is important to know your family and the father's history. If there have been problems with pregnancies or birth defects in your family, report these to your doctor. Also, genetic counselors can talk with you about the information you might need in making decisions about having a family. You can call a major medical center in your area for help in  finding a board-certified genetic counselor. Genetic testing and counseling should be done before pregnancy when possible, especially if there is a history of problems in the mother's or father's family. Certain ethnic backgrounds are more at risk for genetic defects. H Get familiar with the hospital where you will be having your baby. Get to know how long it takes to get there, the labor and delivery area, and the hospital procedures. Be sure your medical insurance is accepted there. Get your home ready for the baby including, clothes, the baby's room (when possible), furniture and car seat. Hand washing is important throughout the day, especially after handling raw meat and poultry, changing the baby's diaper or using the bathroom. This can help prevent the spread of many bacteria and viruses that cause infection. Your hair may become dry and thinner, but will return to normal a few weeks after the baby is born. Heartburn is a common problem that can be treated by taking antacids recommended by your caregiver, eating smaller meals 5 or 6 times a day, not drinking liquids when eating, drinking between meals and raising the head of your bed 2 to 3 inches. I Insurance to cover you, the baby, doctor and hospital should be reviewed so that you will be prepared to pay any costs not covered by your insurance plan. If you do not have medical  insurance, there are usually clinics and services available for you in your community. Take 30 milligrams of iron during your pregnancy as prescribed by your doctor to reduce the risk of low red blood cells (anemia) later in pregnancy. All women of childbearing age should eat a diet rich in iron. J There should be a joint effort for the mother, father and any other children to adapt to the pregnancy financially, emotionally, and psychologically during the pregnancy. Join a support group for moms-to-be. Or, join a class on parenting or childbirth. Have the family participate when  possible. K Know your limits. Let your caregiver know if you experience any of the following:   Pain of any kind.   Strong cramps.   You develop a lot of weight in a short period of time (5 pounds in 3 to 5 days).   Vaginal bleeding, leaking of amniotic fluid.   Headache, vision problems.   Dizziness, fainting, shortness of breath.   Chest pain.   Fever of 102 F (38.9 C) or higher.   Gush of clear fluid from your vagina.   Painful urination.   Domestic violence.   Irregular heartbeat (palpitations).   Rapid beating of the heart (tachycardia).   Constant feeling sick to your stomach (nauseous) and vomiting.   Trouble walking, fluid retention (edema).   Muscle weakness.   If your baby has decreased activity.   Persistent diarrhea.   Abnormal vaginal discharge.   Uterine contractions at 20-minute intervals.   Back pain that travels down your leg.  L Learn and practice that what you eat and drink should be in moderation and healthy for you and your baby. Legal drugs such as alcohol and caffeine are important issues for pregnant women. There is no safe amount of alcohol a woman can drink while pregnant. Fetal alcohol syndrome, a disorder characterized by growth retardation, facial abnormalities, and central nervous system dysfunction, is caused by a woman's use of alcohol during pregnancy. Caffeine, found in tea, coffee, soft drinks and chocolate, should also be limited. Be sure to read labels when trying to cut down on caffeine during pregnancy. More than 200 foods, beverages, and over-the-counter medications contain caffeine and have a high salt content! There are coffees and teas that do not contain caffeine. M Medical conditions such as diabetes, epilepsy, and high blood pressure should be treated and kept under control before pregnancy when possible, but especially during pregnancy. Ask your caregiver about any medications that may need to be changed or adjusted  during pregnancy. If you are currently taking any medications, ask your caregiver if it is safe to take them while you are pregnant or before getting pregnant when possible. Also, be sure to discuss any herbs or vitamins you are taking. They are medicines, too! Discuss with your doctor all medications, prescribed and over-the-counter, that you are taking. During your prenatal visit, discuss the medications your doctor may give you during labor and delivery. N Never be afraid to ask your doctor or caregiver questions about your health, the progress of the pregnancy, family problems, stressful situations, and recommendation for a pediatrician, if you do not have one. It is better to take all precautions and discuss any questions or concerns you may have during your office visits. It is a good idea to write down your questions before you visit the doctor. O Over-the-counter cough and cold remedies may contain alcohol or other ingredients that should be avoided during pregnancy. Ask your caregiver about prescription, herbs or  over-the-counter medications that you are taking or may consider taking while pregnant.  P Physical activity during pregnancy can benefit both you and your baby by lessening discomfort and fatigue, providing a sense of well-being, and increasing the likelihood of early recovery after delivery. Light to moderate exercise during pregnancy strengthens the belly (abdominal) and back muscles. This helps improve posture. Practicing yoga, walking, swimming, and cycling on a stationary bicycle are usually safe exercises for pregnant women. Avoid scuba diving, exercise at high altitudes (over 3000 feet), skiing, horseback riding, contact sports, etc. Always check with your doctor before beginning any kind of exercise, especially during pregnancy and especially if you did not exercise before getting pregnant. Q Queasiness, stomach upset and morning sickness are common during pregnancy. Eating a  couple of crackers or dry toast before getting out of bed. Foods that you normally love may make you feel sick to your stomach. You may need to substitute other nutritious foods. Eating 5 or 6 small meals a day instead of 3 large ones may make you feel better. Do not drink with your meals, drink between meals. Questions that you have should be written down and asked during your prenatal visits. R Read about and make plans to baby-proof your home. There are important tips for making your home a safer environment for your baby. Review the tips and make your home safer for you and your baby. Read food labels regarding calories, salt and fat content in the food. S Saunas, hot tubs, and steam rooms should be avoided while you are pregnant. Excessive high heat may be harmful during your pregnancy. Your caregiver will screen and examine you for sexually transmitted diseases and genetic disorders during your prenatal visits. Learn the signs of labor. Sexual relations while pregnant is safe unless there is a medical or pregnancy problem and your caregiver advises against it. T Traveling long distances should be avoided especially in the third trimester of your pregnancy. If you do have to travel out of state, be sure to take a copy of your medical records and medical insurance plan with you. You should not travel long distances without seeing your doctor first. Most airlines will not allow you to travel after 36 weeks of pregnancy. Toxoplasmosis is an infection caused by a parasite that can seriously harm an unborn baby. Avoid eating undercooked meat and handling cat litter. Be sure to wear gloves when gardening. Tingling of the hands and fingers is not unusual and is due to fluid retention. This will go away after the baby is born. U Womb (uterus) size increases during the first trimester. Your kidneys will begin to function more efficiently. This may cause you to feel the need to urinate more often. You may also  leak urine when sneezing, coughing or laughing. This is due to the growing uterus pressing against your bladder, which lies directly in front of and slightly under the uterus during the first few months of pregnancy. If you experience burning along with frequency of urination or bloody urine, be sure to tell your doctor. The size of your uterus in the third trimester may cause a problem with your balance. It is advisable to maintain good posture and avoid wearing high heels during this time. An ultrasound of your baby may be necessary during your pregnancy and is safe for you and your baby. V Vaccinations are an important concern for pregnant women. Get needed vaccines before pregnancy. Center for Disease Control ([employer]) has clear guidelines for the use  of vaccines during pregnancy. Review the list, be sure to discuss it with your doctor. Prenatal vitamins are helpful and healthy for you and the baby. Do not take extra vitamins except what is recommended. Taking too much of certain vitamins can cause overdose problems. Continuous vomiting should be reported to your caregiver. Varicose veins may appear especially if there is a family history of varicose veins. They should subside after the delivery of the baby. Support hose helps if there is leg discomfort. W Being overweight or underweight during pregnancy may cause problems. Try to get within 15 pounds of your ideal weight before pregnancy. Remember, pregnancy is not a time to be dieting! Do not stop eating or start skipping meals as your weight increases. Both you and your baby need the calories and nutrition you receive from a healthy diet. Be sure to consult with your doctor about your diet. There is a formula and diet plan available depending on whether you are overweight or underweight. Your caregiver or nutritionist can help and advise you if necessary. X Avoid X-rays. If you must have dental work or diagnostic tests, tell your dentist or  physician that you are pregnant so that extra care can be taken. X-rays should only be taken when the risks of not taking them outweigh the risk of taking them. If needed, only the minimum amount of radiation should be used. When X-rays are necessary, protective lead shields should be used to cover areas of the body that are not being X-rayed. Y Your baby loves you. Breastfeeding your baby creates a loving and very close bond between the two of you. Give your baby a healthy environment to live in while you are pregnant. Infants and children require constant care and guidance. Their health and safety should be carefully watched at all times. After the baby is born, rest or take a nap when the baby is sleeping. Z Get your ZZZs. Be sure to get plenty of rest. Resting on your side as often as possible, especially on your left side is advised. It provides the best circulation to your baby and helps reduce swelling. Try taking a nap for 30 to 45 minutes in the afternoon when possible. After the baby is born rest or take a nap when the baby is sleeping. Try elevating your feet for that amount of time when possible. It helps the circulation in your legs and helps reduce swelling.  Most information courtesy of the CDC. Document Released: 07/03/2005 Document Revised: 06/22/2011 Document Reviewed: 03/17/2009 [facility] Patient Information 2012 [place], [place].  Pregnancy If you are planning on getting pregnant, it is a good idea to make a preconception appointment with your care- giver to discuss having a healthy lifestyle before getting pregnant. Such as, diet, weight, exercise, taking prenatal vitamins especially folic acid (it helps prevent brain and spinal cord defects), avoiding alcohol, smoking and illegal drugs, medical problems (diabetes, convulsions), family history of genetic problems, working conditions and immunizations. It is better to have knowledge of these things and do something about them before  getting pregnant. In your pregnancy, it is important to follow certain guidelines to have a healthy baby. It is very important to get good prenatal care and follow your caregiver's instructions. Prenatal care includes all the medical care you receive before your baby's birth. This helps to prevent problems during the pregnancy and childbirth. HOME CARE INSTRUCTIONS   Start your prenatal visits by the 12th week of pregnancy or before when possible. They are usually scheduled  monthly at first. They are more often in the last 2 months before delivery. It is important that you keep your caregiver's appointments and follow your caregiver's instructions regarding medication use, exercise, and diet.   During pregnancy, you are providing food for you and your baby. Eat a regular, well-balanced diet. Choose foods such as meat, fish, milk and other dairy products, vegetables, fruits, whole-grain breads and cereals. Your caregiver will inform you of the ideal weight gain depending on your current height and weight. Drink lots of liquids. Try to drink 8 glasses of water a day.   Alcohol is associated with a number of birth defects including fetal alcohol syndrome. It is best to avoid alcohol completely. Smoking will cause low birth rate and prematurity. Use of alcohol and nicotine during your pregnancy also increases the chances that your child will be chemically dependent later in their life and may contribute to SIDS (Sudden Infant Death Syndrome).   Do not use illegal drugs.   Only take prescription or over-the-counter medications that are recommended by your caregiver. Other medications can cause genetic and physical problems in the baby.   Morning sickness can often be helped by keeping soda crackers at the bedside. Eat a couple before arising in the morning.   A sexual relationship may be continued until near the end of pregnancy if there are no other problems such as early (premature) leaking of amniotic  fluid from the membranes, vaginal bleeding, painful intercourse or belly (abdominal) pain.   Exercise regularly. Check with your caregiver if you are unsure of the safety of some of your exercises.   Do not use hot tubs, steam rooms or saunas. These increase the risk of fainting or passing out and hurting yourself and the baby. Swimming is OK for exercise. Get plenty of rest, including afternoon naps when possible especially in the third trimester.   Avoid toxic odors and chemicals.   Do not wear high heels. They may cause you to lose your balance and fall.   Do not lift over 5 pounds. If you do lift anything, lift with your legs and thighs, not your back.   Avoid long trips, especially in the third trimester.   If you have to travel out of the city or state, take a copy of your medical records with you.  SEEK IMMEDIATE MEDICAL CARE IF:   You develop an unexplained oral temperature above 102 F (38.9 C), or as your caregiver suggests.   You have leaking of fluid from the vagina. If leaking membranes are suspected, take your temperature and inform your caregiver of this when you call.   There is vaginal spotting or bleeding. Notify your caregiver of the amount and how many pads are used.   You continue to feel sick to your stomach (nauseous) and have no relief from remedies suggested, or you throw up (vomit) blood or coffee ground like materials.   You develop upper abdominal pain.   You have round ligament discomfort in the lower abdominal area. This still must be evaluated by your caregiver.   You feel contractions of the uterus.   You do not feel the baby move, or there is less movement than before.   You have painful urination.   You have abnormal vaginal discharge.   You have persistent diarrhea.   You get a severe headache.   You have problems with your vision.   You develop muscle weakness.   You feel dizzy and faint.   You  develop shortness of breath.   You  develop chest pain.   You have back pain that travels down to your leg and feet.   You feel irregular or a very fast heartbeat.   You develop excessive weight gain in a short period of time (5 pounds in 3 to 5 days).   You are involved with a domestic violence situation.  Document Released: 07/03/2005 Document Revised: 06/22/2011 Document Reviewed: 12/25/2008 [facility] Patient Information 2012 [place], [place].

## 2012-01-30 ENCOUNTER — Other Ambulatory Visit: Payer: Medicaid Other

## 2012-01-30 DIAGNOSIS — Z3480 Encounter for supervision of other normal pregnancy, unspecified trimester: Secondary | ICD-10-CM

## 2012-01-30 LAB — GLUCOSE, CAPILLARY: Glucose-Capillary: 85 mg/dL (ref 70–99)

## 2012-01-30 NOTE — Progress Notes (Signed)
3 HR GTT DONE TODAY Michelle [name] 

## 2012-01-31 LAB — GLUCOSE TOLERANCE, 3 HOURS
Glucose Tolerance, 1 hour: 118 mg/dL (ref 70–189)
Glucose Tolerance, 2 hour: 97 mg/dL (ref 70–164)
Glucose Tolerance, Fasting: 84 mg/dL (ref 70–104)
Glucose, GTT - 3 Hour: 88 mg/dL (ref 70–144)

## 2012-02-05 ENCOUNTER — Other Ambulatory Visit: Payer: Self-pay | Admitting: Sports Medicine

## 2012-02-05 DIAGNOSIS — Z3490 Encounter for supervision of normal pregnancy, unspecified, unspecified trimester: Secondary | ICD-10-CM | POA: Insufficient documentation

## 2012-02-06 ENCOUNTER — Encounter: Payer: Self-pay | Admitting: Sports Medicine

## 2012-02-06 NOTE — Progress Notes (Signed)
Note reviewed.  Agree with plan.  Following issues noted: H/o GDM - early 3 hour GTT normal.  Will need repeat at 24-28 weeks.   Smoker - cessation encouraged.  BPAD with h/o PTSD - could use support of Pregnancy Medical Home case manager.  Should fill out PMH forms next visit, if not already done. Rubella equivocal - needs post partum vaccination. Dating - per radiology note, LMP unsure so will use early ultrasound for dates. Vaginal bleeding early pregnancy - subchorionic hemorrhage on ultrasound.  Bleeding resolved.  H/o abnormal pap (ASCUS - H) in 2008.  Most recent pap normal.  Need urine culture.

## 2012-02-06 NOTE — Progress Notes (Signed)
  Subjective:    Michelle [name] is being seen today for her first obstetrical visit.  This is a planned pregnancy. She is at [redacted]w[redacted]d gestation. Her obstetrical history is significant for smoker. Relationship with FOB: significant other, living together. Patient does intend to breast feed. Pregnancy history fully reviewed.  Patient reports no complaints.  Review of Systems:   Review of Systems Pt reports infrequent headaches, worse when missed meals. No current bleeding, discharge No cramping NO vision changes NO swelling Some nausea, no vomiting  Objective:     BP 111/67  Temp 98.5 F (36.9 C)  Wt 142 lb (64.411 kg)  LMP 10/23/2011 Physical Exam  PE: GENERAL:  Adult caucasian female.  [facility]  In no discomfort; norespiratory distress.   PSYCH: Alert and appropriately interactive; Insight:Fair   H&N: AT/[place], MMM, no scleral icterus, EOMi THORAX: HEART: RRR, S1/S2 heard, no murmur LUNGS: CTA B, no wheezes, no crackles EXTREMITIES: Moves all 4 extremities spontaneously, warm well perfused, 1+ edema, bilateral DP and PT pulses 2/4.   Pelvic exam: normal external genitalia, vulva, vagina, cervix, uterus and adnexa, PAP: Pap smear done today, exam chaperoned, uterine size consistent with dates.  Exam    Assessment:    Pregnancy: [id number] Patient Active Problem List  Diagnosis  . DSORD BIPOLAR I, UNSPC, MOST RECENT EPSD  . SMOKER  . PTSD  . ABNORMAL PAP SMEAR  . PAP SMEAR, LGSIL, ABNORMAL  . Fibrocystic disease of breast  . Menorrhagia  . Malpositioned IUD  . Acute abdominal pain in right lower quadrant  . Abdominal pain  . Syncope  . Nausea & vomiting  . Chest pain  . Vaginal bleeding before [redacted] weeks gestation  . Supervision of normal pregnancy       Plan:     Initial labs drawn. Prenatal vitamins. Problem list reviewed and updated. AFP3 discussed: declined. Role of ultrasound in pregnancy discussed; fetal survey: requested. Amniocentesis  discussed: not indicated. Follow up in 1 weeks. >50% of 25 min visit spent on counseling and coordination of care.    Andrena Mews, DO [facility] Resident - PGY-2 02/06/2012 12:07 AM

## 2012-02-10 ENCOUNTER — Telehealth: Payer: Self-pay | Admitting: Emergency Medicine

## 2012-02-10 NOTE — Telephone Encounter (Signed)
Patient called emergency line for nausea and nosebleeds.  She is currently [redacted] weeks pregnant.  Reported two episodes of NBNB vomiting last night.  Has tried eating or drinking anything since then.  She also reports two nosebleeds, one last night that lasted about 30 seconds and another one this morning that resolved quickly but she "bled a lot."  Discussed that if she was able to tolerate fluids and the nosebleeds stopped quickly, she is probably okay until Monday.  However, given her concern over amount of blood with nosebleed this morning, informed that she can go to [facility] to be evaluated.

## 2012-02-14 ENCOUNTER — Encounter: Payer: Medicaid Other | Admitting: Sports Medicine

## 2012-02-15 ENCOUNTER — Ambulatory Visit (INDEPENDENT_AMBULATORY_CARE_PROVIDER_SITE_OTHER): Payer: Medicaid Other | Admitting: Sports Medicine

## 2012-02-15 ENCOUNTER — Telehealth: Payer: Self-pay | Admitting: *Deleted

## 2012-02-15 VITALS — BP 119/78 | Wt 144.4 lb

## 2012-02-15 DIAGNOSIS — Z3490 Encounter for supervision of normal pregnancy, unspecified, unspecified trimester: Secondary | ICD-10-CM

## 2012-02-15 DIAGNOSIS — M999 Biomechanical lesion, unspecified: Secondary | ICD-10-CM | POA: Insufficient documentation

## 2012-02-15 DIAGNOSIS — Z3480 Encounter for supervision of other normal pregnancy, unspecified trimester: Secondary | ICD-10-CM

## 2012-02-15 DIAGNOSIS — F319 Bipolar disorder, unspecified: Secondary | ICD-10-CM

## 2012-02-15 NOTE — Patient Instructions (Addendum)
CUT BACK ON YOUR MT DEW and other Caffeine containing beverages. STOP SMOKING!!! Follow up in 4 weeks we will arrange for an ultrasound before your next appointment.  Pregnancy - Second Trimester The second trimester of pregnancy (3 to 6 months) is a period of rapid growth for you and your baby. At the end of the sixth month, your baby is about 9 inches long and weighs 1 1/2 pounds. You will begin to feel the baby move between 18 and 20 weeks of the pregnancy. This is called quickening. Weight gain is faster. A clear fluid (colostrum) may leak out of your breasts. You may feel small contractions of the womb (uterus). This is known as false labor or Braxton-Hicks contractions. This is like a practice for labor when the baby is ready to be born. Usually, the problems with morning sickness have usually passed by the end of your first trimester. Some women develop small dark blotches (called cholasma, mask of pregnancy) on their face that usually goes away after the baby is born. Exposure to the sun makes the blotches worse. Acne may also develop in some pregnant women and pregnant women who have acne, may find that it goes away. PRENATAL EXAMS  Blood work may continue to be done during prenatal exams. These tests are done to check on your health and the probable health of your baby. Blood work is used to follow your blood levels (hemoglobin). Anemia (low hemoglobin) is common during pregnancy. Iron and vitamins are given to help prevent this. You will also be checked for diabetes between 24 and 28 weeks of the pregnancy. Some of the previous blood tests may be repeated.   The size of the uterus is measured during each visit. This is to make sure that the baby is continuing to grow properly according to the dates of the pregnancy.   Your blood pressure is checked every prenatal visit. This is to make sure you are not getting toxemia.   Your urine is checked to make sure you do not have an infection,  diabetes or protein in the urine.   Your weight is checked often to make sure gains are happening at the suggested rate. This is to ensure that both you and your baby are growing normally.   Sometimes, an ultrasound is performed to confirm the proper growth and development of the baby. This is a test which bounces harmless sound waves off the baby so your caregiver can more accurately determine due dates.  Sometimes, a specialized test is done on the amniotic fluid surrounding the baby. This test is called an amniocentesis. The amniotic fluid is obtained by sticking a needle into the belly (abdomen). This is done to check the chromosomes in instances where there is a concern about possible genetic problems with the baby. It is also sometimes done near the end of pregnancy if an early delivery is required. In this case, it is done to help make sure the baby's lungs are mature enough for the baby to live outside of the womb. CHANGES OCCURING IN THE SECOND TRIMESTER OF PREGNANCY Your body goes through many changes during pregnancy. They vary from person to person. Talk to your caregiver about changes you notice that you are concerned about.  During the second trimester, you will likely have an increase in your appetite. It is normal to have cravings for certain foods. This varies from person to person and pregnancy to pregnancy.   Your lower abdomen will begin to bulge.  You may have to urinate more often because the uterus and baby are pressing on your bladder. It is also common to get more bladder infections during pregnancy (pain with urination). You can help this by drinking lots of fluids and emptying your bladder before and after intercourse.   You may begin to get stretch marks on your hips, abdomen, and breasts. These are normal changes in the body during pregnancy. There are no exercises or medications to take that prevent this change.   You may begin to develop swollen and bulging veins  (varicose veins) in your legs. Wearing support hose, elevating your feet for 15 minutes, 3 to 4 times a day and limiting salt in your diet helps lessen the problem.   Heartburn may develop as the uterus grows and pushes up against the stomach. Antacids recommended by your caregiver helps with this problem. Also, eating smaller meals 4 to 5 times a day helps.   Constipation can be treated with a stool softener or adding bulk to your diet. Drinking lots of fluids, vegetables, fruits, and whole grains are helpful.   Exercising is also helpful. If you have been very active up until your pregnancy, most of these activities can be continued during your pregnancy. If you have been less active, it is helpful to start an exercise program such as walking.   Hemorrhoids (varicose veins in the rectum) may develop at the end of the second trimester. Warm sitz baths and hemorrhoid cream recommended by your caregiver helps hemorrhoid problems.   Backaches may develop during this time of your pregnancy. Avoid heavy lifting, wear low heal shoes and practice good posture to help with backache problems.   Some pregnant women develop tingling and numbness of their hand and fingers because of swelling and tightening of ligaments in the wrist (carpel tunnel syndrome). This goes away after the baby is born.   As your breasts enlarge, you may have to get a bigger bra. Get a comfortable, cotton, support bra. Do not get a nursing bra until the last month of the pregnancy if you will be nursing the baby.   You may get a dark line from your belly button to the pubic area called the linea nigra.   You may develop rosy cheeks because of increase blood flow to the face.   You may develop spider looking lines of the face, neck, arms and chest. These go away after the baby is born.  HOME CARE INSTRUCTIONS   It is extremely important to avoid all smoking, herbs, alcohol, and unprescribed drugs during your pregnancy. These  chemicals affect the formation and growth of the baby. Avoid these chemicals throughout the pregnancy to ensure the delivery of a healthy infant.   Most of your home care instructions are the same as suggested for the first trimester of your pregnancy. Keep your caregiver's appointments. Follow your caregiver's instructions regarding medication use, exercise and diet.   During pregnancy, you are providing food for you and your baby. Continue to eat regular, well-balanced meals. Choose foods such as meat, fish, milk and other low fat dairy products, vegetables, fruits, and whole-grain breads and cereals. Your caregiver will tell you of the ideal weight gain.   A physical sexual relationship may be continued up until near the end of pregnancy if there are no other problems. Problems could include early (premature) leaking of amniotic fluid from the membranes, vaginal bleeding, abdominal pain, or other medical or pregnancy problems.   Exercise regularly if  there are no restrictions. Check with your caregiver if you are unsure of the safety of some of your exercises. The greatest weight gain will occur in the last 2 trimesters of pregnancy. Exercise will help you:   Control your weight.   Get you in shape for labor and delivery.   Lose weight after you have the baby.   Wear a good support or jogging bra for breast tenderness during pregnancy. This may help if worn during sleep. Pads or tissues may be used in the bra if you are leaking colostrum.   Do not use hot tubs, steam rooms or saunas throughout the pregnancy.   Wear your seat belt at all times when driving. This protects you and your baby if you are in an accident.   Avoid raw meat, uncooked cheese, cat litter boxes and soil used by cats. These carry germs that can cause birth defects in the baby.   The second trimester is also a good time to visit your dentist for your dental health if this has not been done yet. Getting your teeth cleaned  is OK. Use a soft toothbrush. Brush gently during pregnancy.   It is easier to loose urine during pregnancy. Tightening up and strengthening the pelvic muscles will help with this problem. Practice stopping your urination while you are going to the bathroom. These are the same muscles you need to strengthen. It is also the muscles you would use as if you were trying to stop from passing gas. You can practice tightening these muscles up 10 times a set and repeating this about 3 times per day. Once you know what muscles to tighten up, do not perform these exercises during urination. It is more likely to contribute to an infection by backing up the urine.   Ask for help if you have financial, counseling or nutritional needs during pregnancy. Your caregiver will be able to offer counseling for these needs as well as refer you for other special needs.   Your skin may become oily. If so, wash your face with mild soap, use non-greasy moisturizer and oil or cream based makeup.  MEDICATIONS AND DRUG USE IN PREGNANCY  Take prenatal vitamins as directed. The vitamin should contain 1 milligram of folic acid. Keep all vitamins out of reach of children. Only a couple vitamins or tablets containing iron may be fatal to a baby or young child when ingested.   Avoid use of all medications, including herbs, over-the-counter medications, not prescribed or suggested by your caregiver. Only take over-the-counter or prescription medicines for pain, discomfort, or fever as directed by your caregiver. Do not use aspirin.   Let your caregiver also know about herbs you may be using.   Alcohol is related to a number of birth defects. This includes fetal alcohol syndrome. All alcohol, in any form, should be avoided completely. Smoking will cause low birth rate and premature babies.   Street or illegal drugs are very harmful to the baby. They are absolutely forbidden. A baby born to an addicted mother will be addicted at birth.  The baby will go through the same withdrawal an adult does.  SEEK MEDICAL CARE IF:  You have any concerns or worries during your pregnancy. It is better to call with your questions if you feel they cannot wait, rather than worry about them. SEEK IMMEDIATE MEDICAL CARE IF:   An unexplained oral temperature above 102 F (38.9 C) develops, or as your caregiver suggests.   You have  leaking of fluid from the vagina (birth canal). If leaking membranes are suspected, take your temperature and tell your caregiver of this when you call.   There is vaginal spotting, bleeding, or passing clots. Tell your caregiver of the amount and how many pads are used. Light spotting in pregnancy is common, especially following intercourse.   You develop a bad smelling vaginal discharge with a change in the color from clear to white.   You continue to feel sick to your stomach (nauseated) and have no relief from remedies suggested. You vomit blood or coffee ground-like materials.   You lose more than 2 pounds of weight or gain more than 2 pounds of weight over 1 week, or as suggested by your caregiver.   You notice swelling of your face, hands, feet, or legs.   You get exposed to [place] measles and have never had them.   You are exposed to fifth disease or chickenpox.   You develop belly (abdominal) pain. Round ligament discomfort is a common non-cancerous (benign) cause of abdominal pain in pregnancy. Your caregiver still must evaluate you.   You develop a bad headache that does not go away.   You develop fever, diarrhea, pain with urination, or shortness of breath.   You develop visual problems, blurry, or double vision.   You fall or are in a car accident or any kind of trauma.   There is mental or physical violence at home.  Document Released: 06/27/2001 Document Revised: 06/22/2011 Document Reviewed: 12/30/2008 [facility] Patient Information 2012 [place], [place].

## 2012-02-15 NOTE — Assessment & Plan Note (Signed)
PHQ-9 - Difficulty: Somewhat Difficult 1 2 3 4 5 6 7 8 9  Total:  1 0 3 3 2 1 1 0 0 11    Completed [facility] pregnancy home risk screening form.

## 2012-02-15 NOTE — Progress Notes (Signed)
Pt reports frequent headaches and fatigue.  Not sleeping well.  Consuming excessive Mt. Dew.   Urine Culture today. Counseled on smoking cessation and caffine intake. OMT provided for headache and lumbar pain. Follow up in 4 weeks. Anatomy US ordered

## 2012-02-15 NOTE — Assessment & Plan Note (Signed)
OMT EXAM: Seated Flexion/ASIS CompressionTest: left Leg Length screening: R pseudoshort leg CERVICAL  C2 RR  THORACIC  T3 rLsL,  T6 rRsR  RIBS  R rib 7 posterior  LUMBAR  L5 rL  SACRUM  RonR  PELVIS  Posterior L innominant   Decision today to treat with OMT was based on Physical Exam  After verbal consent patient was treated with ME, CS,  techniques in Cervical, Thoracic, Rib, lumbar, sacral and pelvic SD addressed today findings as below: areas  Patient tolerated the procedure well with improvement in symptoms  Patient given exercises, stretches and lifestyle modifications  See medications in patient instructions if given  Patient will follow up in 4 weeks

## 2012-02-15 NOTE — Telephone Encounter (Signed)
Left message for patient to return call. She has an appointment at [facility] on 03/07/12 at 9:30 for OB US. She needs to arrive at 9:15 and bring a picture ID and insurance card.Hale Chalfin, Rodena Medin

## 2012-02-17 ENCOUNTER — Telehealth: Payer: Self-pay | Admitting: Sports Medicine

## 2012-02-18 LAB — CULTURE, OB URINE: Colony Count: >=100000

## 2012-02-22 ENCOUNTER — Telehealth: Payer: Self-pay | Admitting: Sports Medicine

## 2012-02-22 NOTE — Telephone Encounter (Signed)
Pt is requesting a proof of pregnancy letter.

## 2012-02-22 NOTE — Telephone Encounter (Signed)
Verification of pregnancy letter done,pt to pick up.

## 2012-02-26 ENCOUNTER — Telehealth: Payer: Self-pay | Admitting: Sports Medicine

## 2012-02-26 DIAGNOSIS — Z3490 Encounter for supervision of normal pregnancy, unspecified, unspecified trimester: Secondary | ICD-10-CM

## 2012-02-26 DIAGNOSIS — O99891 Other specified diseases and conditions complicating pregnancy: Secondary | ICD-10-CM

## 2012-02-26 DIAGNOSIS — R8271 Bacteriuria: Secondary | ICD-10-CM

## 2012-02-26 MED ORDER — NITROFURANTOIN MONOHYD MACRO 100 MG PO CAPS: 100.0000 mg | ORAL_CAPSULE | Freq: Two times a day (BID) | ORAL | Status: DC

## 2012-02-26 NOTE — Telephone Encounter (Signed)
Called and spoke with patient regarding asymptomatic bacteria.  Will treat with 7 days of Macrobid.  Stressed importance of adherence to 2 resistant nature of organism and potential need for injectable therapy if fails Macrobid.  Patient did request screening for neural tube defects and reviewed options of quad screen.  Will call patient to schedule this as she is before 20 weeks

## 2012-02-26 NOTE — Telephone Encounter (Signed)
Error

## 2012-02-27 ENCOUNTER — Telehealth: Payer: Self-pay | Admitting: *Deleted

## 2012-02-27 ENCOUNTER — Other Ambulatory Visit: Payer: Self-pay | Admitting: Sports Medicine

## 2012-02-27 MED ORDER — SULFAMETHOXAZOLE-TMP DS 800-160 MG PO TABS: 1.0000 | ORAL_TABLET | Freq: Two times a day (BID) | ORAL | Status: AC

## 2012-02-27 NOTE — Telephone Encounter (Signed)
Patient calls stating she cannot afford medication sent in for urinary tract infection. Wonders if she can  come in for an injection .    Her medicaid is pending and will take about  1.5 weeks to be effective. She failed to fill out needed paperwork in time.  Will forward message to Dr. Berline Chough.

## 2012-02-27 NOTE — Telephone Encounter (Signed)
Dr. Berline Chough sent in another RX that will be on $4.00 plan .  Message left with mother to have patient call back.

## 2012-02-27 NOTE — Telephone Encounter (Signed)
LVM for patient to call back to schedule appointment for her

## 2012-02-27 NOTE — Telephone Encounter (Signed)
Okay for lab only appointment.  F/u with me as previously scheduled

## 2012-02-29 NOTE — Telephone Encounter (Signed)
Patient never called back . I was able to get in touch with her this AM.  States her mother ended up giving her the money to get [employer] ( first RX sent in by Dr. Berline Chough).  Also the pharmacy called her to advise that another RX had been sent in for bactrim so  she picked that up also. She ask pharmacist if she was suppose to take both and was told yes since MD since both on same day.  Took both yesterday. Advised patient to just take Macrobid. Will notify MD.

## 2012-03-06 ENCOUNTER — Ambulatory Visit (INDEPENDENT_AMBULATORY_CARE_PROVIDER_SITE_OTHER): Payer: Medicaid Other | Admitting: Sports Medicine

## 2012-03-06 VITALS — BP 106/68 | Wt 144.0 lb

## 2012-03-06 DIAGNOSIS — Z3480 Encounter for supervision of other normal pregnancy, unspecified trimester: Secondary | ICD-10-CM

## 2012-03-06 DIAGNOSIS — Z3490 Encounter for supervision of normal pregnancy, unspecified, unspecified trimester: Secondary | ICD-10-CM

## 2012-03-06 DIAGNOSIS — R8271 Bacteriuria: Secondary | ICD-10-CM

## 2012-03-06 DIAGNOSIS — O99891 Other specified diseases and conditions complicating pregnancy: Secondary | ICD-10-CM

## 2012-03-06 LAB — POCT URINALYSIS DIPSTICK
Bilirubin, UA: NEGATIVE
Blood, UA: NEGATIVE
Glucose, UA: NEGATIVE
Ketones, UA: NEGATIVE
Nitrite, UA: NEGATIVE
Protein, UA: NEGATIVE
Spec Grav, UA: 1.015
Urobilinogen, UA: 0.2
pH, UA: 7

## 2012-03-06 LAB — POCT UA - MICROSCOPIC ONLY

## 2012-03-07 ENCOUNTER — Encounter: Payer: Self-pay | Admitting: Sports Medicine

## 2012-03-07 ENCOUNTER — Ambulatory Visit (HOSPITAL_COMMUNITY)
Admission: RE | Admit: 2012-03-07 | Discharge: 2012-03-07 | Disposition: A | Discharge status: Home / Self Care | Payer: Medicaid Other | Source: Ambulatory Visit | Attending: Family Medicine | Admitting: Family Medicine

## 2012-03-07 DIAGNOSIS — O99330 Smoking (tobacco) complicating pregnancy, unspecified trimester: Secondary | ICD-10-CM | POA: Insufficient documentation

## 2012-03-07 DIAGNOSIS — Z3490 Encounter for supervision of normal pregnancy, unspecified, unspecified trimester: Secondary | ICD-10-CM

## 2012-03-07 DIAGNOSIS — Z3689 Encounter for other specified antenatal screening: Secondary | ICD-10-CM | POA: Insufficient documentation

## 2012-03-08 ENCOUNTER — Telehealth: Payer: Self-pay | Admitting: Sports Medicine

## 2012-03-08 LAB — CULTURE, OB URINE
Colony Count: NO GROWTH
Organism ID, Bacteria: NO GROWTH

## 2012-03-08 NOTE — Telephone Encounter (Signed)
Patient is calling to discuss the results of the ultrasound from yesterday.

## 2012-03-08 NOTE — Telephone Encounter (Signed)
Called and LVM informing of normal Fetal US.  Need for repeat during 3rd trimester to re-evaluate placenta.

## 2012-03-13 NOTE — Progress Notes (Signed)
Pt presents for routine care.  No complaints.  Has significantly decreased her caffine intake.  Headaches improved Results of QUAD screen reviewed [place] ordered

## 2012-03-14 ENCOUNTER — Encounter: Payer: Self-pay | Admitting: Sports Medicine

## 2012-03-20 ENCOUNTER — Telehealth: Payer: Self-pay | Admitting: *Deleted

## 2012-03-20 NOTE — Telephone Encounter (Signed)
Patient calls stating  for past few days has been having contraction once daily , yesterday had two , one in AM and one in afternoon. Denies any leakage of fluid. Does have some thin white discharge.  She is feeling baby move . She received a call from  Dr. Berline Chough about [place]  and she thought he said something about amnionic fluid being low and is wondering if this is why she is having contractions. Consulted Dr. Earnest Bailey and she looked at [place] reports and advised that placenta is lying low but fluid looks OK. Patient states her phone was breaking up so she didn't hear correctly. Dr. Earnest Bailey  Advises these are Michelle [name] and if pain becomes  more intense or leakage of fluid go to [facility] or call MD.

## 2012-03-27 ENCOUNTER — Ambulatory Visit (INDEPENDENT_AMBULATORY_CARE_PROVIDER_SITE_OTHER): Payer: Medicaid Other | Admitting: Sports Medicine

## 2012-03-27 DIAGNOSIS — Z3480 Encounter for supervision of other normal pregnancy, unspecified trimester: Secondary | ICD-10-CM

## 2012-03-27 NOTE — Patient Instructions (Addendum)
Please follow up in 3 weeks in our OB clinic.  Pregnancy - Second Trimester The second trimester of pregnancy (3 to 6 months) is a period of rapid growth for you and your baby. At the end of the sixth month, your baby is about 9 inches long and weighs 1 1/2 pounds. You will begin to feel the baby move between 18 and 20 weeks of the pregnancy. This is called quickening. Weight gain is faster. A clear fluid (colostrum) may leak out of your breasts. You may feel small contractions of the womb (uterus). This is known as false labor or Braxton-Hicks contractions. This is like a practice for labor when the baby is ready to be born. Usually, the problems with morning sickness have usually passed by the end of your first trimester. Some women develop small dark blotches (called cholasma, mask of pregnancy) on their face that usually goes away after the baby is born. Exposure to the sun makes the blotches worse. Acne may also develop in some pregnant women and pregnant women who have acne, may find that it goes away. PRENATAL EXAMS  Blood work may continue to be done during prenatal exams. These tests are done to check on your health and the probable health of your baby. Blood work is used to follow your blood levels (hemoglobin). Anemia (low hemoglobin) is common during pregnancy. Iron and vitamins are given to help prevent this. You will also be checked for diabetes between 24 and 28 weeks of the pregnancy. Some of the previous blood tests may be repeated.   The size of the uterus is measured during each visit. This is to make sure that the baby is continuing to grow properly according to the dates of the pregnancy.   Your blood pressure is checked every prenatal visit. This is to make sure you are not getting toxemia.   Your urine is checked to make sure you do not have an infection, diabetes or protein in the urine.   Your weight is checked often to make sure gains are happening at the suggested rate. This  is to ensure that both you and your baby are growing normally.   Sometimes, an ultrasound is performed to confirm the proper growth and development of the baby. This is a test which bounces harmless sound waves off the baby so your caregiver can more accurately determine due dates.  Sometimes, a specialized test is done on the amniotic fluid surrounding the baby. This test is called an amniocentesis. The amniotic fluid is obtained by sticking a needle into the belly (abdomen). This is done to check the chromosomes in instances where there is a concern about possible genetic problems with the baby. It is also sometimes done near the end of pregnancy if an early delivery is required. In this case, it is done to help make sure the baby's lungs are mature enough for the baby to live outside of the womb. CHANGES OCCURING IN THE SECOND TRIMESTER OF PREGNANCY Your body goes through many changes during pregnancy. They vary from person to person. Talk to your caregiver about changes you notice that you are concerned about.  During the second trimester, you will likely have an increase in your appetite. It is normal to have cravings for certain foods. This varies from person to person and pregnancy to pregnancy.   Your lower abdomen will begin to bulge.   You may have to urinate more often because the uterus and baby are pressing on your bladder.  It is also common to get more bladder infections during pregnancy (pain with urination). You can help this by drinking lots of fluids and emptying your bladder before and after intercourse.   You may begin to get stretch marks on your hips, abdomen, and breasts. These are normal changes in the body during pregnancy. There are no exercises or medications to take that prevent this change.   You may begin to develop swollen and bulging veins (varicose veins) in your legs. Wearing support hose, elevating your feet for 15 minutes, 3 to 4 times a day and limiting salt in  your diet helps lessen the problem.   Heartburn may develop as the uterus grows and pushes up against the stomach. Antacids recommended by your caregiver helps with this problem. Also, eating smaller meals 4 to 5 times a day helps.   Constipation can be treated with a stool softener or adding bulk to your diet. Drinking lots of fluids, vegetables, fruits, and whole grains are helpful.   Exercising is also helpful. If you have been very active up until your pregnancy, most of these activities can be continued during your pregnancy. If you have been less active, it is helpful to start an exercise program such as walking.   Hemorrhoids (varicose veins in the rectum) may develop at the end of the second trimester. Warm sitz baths and hemorrhoid cream recommended by your caregiver helps hemorrhoid problems.   Backaches may develop during this time of your pregnancy. Avoid heavy lifting, wear low heal shoes and practice good posture to help with backache problems.   Some pregnant women develop tingling and numbness of their hand and fingers because of swelling and tightening of ligaments in the wrist (carpel tunnel syndrome). This goes away after the baby is born.   As your breasts enlarge, you may have to get a bigger bra. Get a comfortable, cotton, support bra. Do not get a nursing bra until the last month of the pregnancy if you will be nursing the baby.   You may get a dark line from your belly button to the pubic area called the linea nigra.   You may develop rosy cheeks because of increase blood flow to the face.   You may develop spider looking lines of the face, neck, arms and chest. These go away after the baby is born.  HOME CARE INSTRUCTIONS   It is extremely important to avoid all smoking, herbs, alcohol, and unprescribed drugs during your pregnancy. These chemicals affect the formation and growth of the baby. Avoid these chemicals throughout the pregnancy to ensure the delivery of a  healthy infant.   Most of your home care instructions are the same as suggested for the first trimester of your pregnancy. Keep your caregiver's appointments. Follow your caregiver's instructions regarding medication use, exercise and diet.   During pregnancy, you are providing food for you and your baby. Continue to eat regular, well-balanced meals. Choose foods such as meat, fish, milk and other low fat dairy products, vegetables, fruits, and whole-grain breads and cereals. Your caregiver will tell you of the ideal weight gain.   A physical sexual relationship may be continued up until near the end of pregnancy if there are no other problems. Problems could include early (premature) leaking of amniotic fluid from the membranes, vaginal bleeding, abdominal pain, or other medical or pregnancy problems.   Exercise regularly if there are no restrictions. Check with your caregiver if you are unsure of the safety of some  of your exercises. The greatest weight gain will occur in the last 2 trimesters of pregnancy. Exercise will help you:   Control your weight.   Get you in shape for labor and delivery.   Lose weight after you have the baby.   Wear a good support or jogging bra for breast tenderness during pregnancy. This may help if worn during sleep. Pads or tissues may be used in the bra if you are leaking colostrum.   Do not use hot tubs, steam rooms or saunas throughout the pregnancy.   Wear your seat belt at all times when driving. This protects you and your baby if you are in an accident.   Avoid raw meat, uncooked cheese, cat litter boxes and soil used by cats. These carry germs that can cause birth defects in the baby.   The second trimester is also a good time to visit your dentist for your dental health if this has not been done yet. Getting your teeth cleaned is OK. Use a soft toothbrush. Brush gently during pregnancy.   It is easier to loose urine during pregnancy. Tightening up and  strengthening the pelvic muscles will help with this problem. Practice stopping your urination while you are going to the bathroom. These are the same muscles you need to strengthen. It is also the muscles you would use as if you were trying to stop from passing gas. You can practice tightening these muscles up 10 times a set and repeating this about 3 times per day. Once you know what muscles to tighten up, do not perform these exercises during urination. It is more likely to contribute to an infection by backing up the urine.   Ask for help if you have financial, counseling or nutritional needs during pregnancy. Your caregiver will be able to offer counseling for these needs as well as refer you for other special needs.   Your skin may become oily. If so, wash your face with mild soap, use non-greasy moisturizer and oil or cream based makeup.  MEDICATIONS AND DRUG USE IN PREGNANCY  Take prenatal vitamins as directed. The vitamin should contain 1 milligram of folic acid. Keep all vitamins out of reach of children. Only a couple vitamins or tablets containing iron may be fatal to a baby or young child when ingested.   Avoid use of all medications, including herbs, over-the-counter medications, not prescribed or suggested by your caregiver. Only take over-the-counter or prescription medicines for pain, discomfort, or fever as directed by your caregiver. Do not use aspirin.   Let your caregiver also know about herbs you may be using.   Alcohol is related to a number of birth defects. This includes fetal alcohol syndrome. All alcohol, in any form, should be avoided completely. Smoking will cause low birth rate and premature babies.   Street or illegal drugs are very harmful to the baby. They are absolutely forbidden. A baby born to an addicted mother will be addicted at birth. The baby will go through the same withdrawal an adult does.  SEEK MEDICAL CARE IF:  You have any concerns or worries during  your pregnancy. It is better to call with your questions if you feel they cannot wait, rather than worry about them. SEEK IMMEDIATE MEDICAL CARE IF:   An unexplained oral temperature above 102 F (38.9 C) develops, or as your caregiver suggests.   You have leaking of fluid from the vagina (birth canal). If leaking membranes are suspected, take your temperature and  tell your caregiver of this when you call.   There is vaginal spotting, bleeding, or passing clots. Tell your caregiver of the amount and how many pads are used. Light spotting in pregnancy is common, especially following intercourse.   You develop a bad smelling vaginal discharge with a change in the color from clear to white.   You continue to feel sick to your stomach (nauseated) and have no relief from remedies suggested. You vomit blood or coffee ground-like materials.   You lose more than 2 pounds of weight or gain more than 2 pounds of weight over 1 week, or as suggested by your caregiver.   You notice swelling of your face, hands, feet, or legs.   You get exposed to [place] measles and have never had them.   You are exposed to fifth disease or chickenpox.   You develop belly (abdominal) pain. Round ligament discomfort is a common non-cancerous (benign) cause of abdominal pain in pregnancy. Your caregiver still must evaluate you.   You develop a bad headache that does not go away.   You develop fever, diarrhea, pain with urination, or shortness of breath.   You develop visual problems, blurry, or double vision.   You fall or are in a car accident or any kind of trauma.   There is mental or physical violence at home.  Document Released: 06/27/2001 Document Revised: 06/22/2011 Document Reviewed: 12/30/2008 [facility] Patient Information 2012 [place], [place].

## 2012-03-28 NOTE — Progress Notes (Signed)
Presents for routine care @ [redacted]w[redacted]d.  Reports occasional intermittent mild cramping.  No other complaints.  Headaches improved.   Reviewed results of [place] - will need repeat US to re-evaluate placenta. F/u in 2.5 weeks with OB clinic for 24 week appointment.

## 2012-04-11 ENCOUNTER — Inpatient Hospital Stay (HOSPITAL_COMMUNITY)
Admission: AD | Admit: 2012-04-11 | Discharge: 2012-04-11 | Disposition: A | Discharge status: Home / Self Care | Payer: Medicaid Other | Source: Ambulatory Visit | Attending: Obstetrics & Gynecology | Admitting: Obstetrics & Gynecology

## 2012-04-11 ENCOUNTER — Encounter (HOSPITAL_COMMUNITY): Payer: Self-pay | Admitting: *Deleted

## 2012-04-11 DIAGNOSIS — O368190 Decreased fetal movements, unspecified trimester, not applicable or unspecified: Secondary | ICD-10-CM

## 2012-04-11 DIAGNOSIS — R109 Unspecified abdominal pain: Secondary | ICD-10-CM | POA: Insufficient documentation

## 2012-04-11 LAB — URINALYSIS, ROUTINE W REFLEX MICROSCOPIC
Bilirubin Urine: NEGATIVE
Glucose, UA: NEGATIVE mg/dL
Hgb urine dipstick: NEGATIVE
Ketones, ur: NEGATIVE mg/dL
Leukocytes, UA: NEGATIVE
Nitrite: NEGATIVE
Protein, ur: NEGATIVE mg/dL
Specific Gravity, Urine: 1.01 (ref 1.005–1.030)
Urobilinogen, UA: 0.2 mg/dL (ref 0.0–1.0)
pH: 7.5 (ref 5.0–8.0)

## 2012-04-11 LAB — RAPID URINE DRUG SCREEN, HOSP PERFORMED
Amphetamines: NOT DETECTED
Barbiturates: NOT DETECTED
Benzodiazepines: NOT DETECTED
Cocaine: NOT DETECTED
Opiates: NOT DETECTED
Tetrahydrocannabinol: NOT DETECTED

## 2012-04-11 NOTE — MAU Provider Note (Signed)
Michelle [name] is  21 y.o. G3P2002 at [redacted]w[redacted]d presents complaining of decreased fetal movement and cramping. Pt denies VB and LOF. She has been receiving care at [facility]. Obstetrical/Gynecological History: Menstrual History: OB History    Grav Para Term Preterm Abortions TAB SAB Ect Mult Living   3 2 2       2       Patient's last menstrual period was 10/23/2011.     Past Medical History: Past Medical History  Diagnosis Date  . Tobacco abuse   . Bipolar I disorder   . PTSD (post-traumatic stress disorder)   . Post partum depression   . Menorrhagia   . 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 Gestational diabetes   . Pregnant   . Chronic headaches     Past Surgical History: Past Surgical History  Procedure Date  . No past surgeries     Family History: Family History  Problem Relation Age of Onset  . Breast cancer Paternal Grandmother   . Colon cancer Paternal Grandfather   . Colon cancer Maternal Grandfather   . Colon polyps Maternal Aunt   . Diabetes Mother     maternal aunts and uncles  . Diabetes Sister   . Diabetes Father   . Heart disease Father     Social History: History  Substance Use Topics  . Smoking status: Current Every Day Smoker -- 0.5 packs/day    Types: Cigarettes  . Smokeless tobacco: Never Used   Comment: decreased smoking   . Alcohol Use: No     occasional    Allergies:  Allergies  Allergen Reactions  . Codeine Anaphylaxis and Hives    Meds:  Prescriptions prior to admission  Medication Sig Dispense Refill  . Prenatal Vit-Fe Fumarate-FA (PRENATAL MULTIVITAMIN) TABS Take 1 tablet by mouth at bedtime.       . traMADol (ULTRAM) 50 MG tablet Take 50 mg by mouth every 6 (six) hours as needed. For pain        Review of Systems - Please refer to the aforementioned patients' reports.     Physical Exam  Blood pressure 123/77, pulse 110, temperature 97.8 F (36.6 C), temperature source Oral, resp.  rate 20, height 5' 2.5" (1.588 m), weight 68.493 kg (151 lb), last menstrual period 10/23/2011, SpO2 100.00%. GENERAL: Well-developed, well-nourished female in no acute distress.  LUNGS: Clear to auscultation bilaterally.  HEART: Regular rate and rhythm. ABDOMEN: Soft, nontender, nondistended, gravid.  EXTREMITIES: Nontender, no edema, 2+ distal pulses. CERVICAL EXAM: Dilatation 0cm   Effacement 0%   Station high     Labs: Recent Results (from the past 24 hour(s))  URINALYSIS, ROUTINE W REFLEX MICROSCOPIC   Collection Time   04/11/12  9:55 PM      Component Value Range   Color, Urine YELLOW  YELLOW   APPearance CLEAR  CLEAR   Specific Gravity, Urine 1.010  1.005 - 1.030   pH 7.5  5.0 - 8.0   Glucose, UA NEGATIVE  NEGATIVE mg/dL   Hgb urine dipstick NEGATIVE  NEGATIVE   Bilirubin Urine NEGATIVE  NEGATIVE   Ketones, ur NEGATIVE  NEGATIVE mg/dL   Protein, ur NEGATIVE  NEGATIVE mg/dL   Urobilinogen, UA 0.2  0.0 - 1.0 mg/dL   Nitrite NEGATIVE  NEGATIVE   Leukocytes, UA NEGATIVE  NEGATIVE   Imaging Studies:  Bedside U/S per Michelle [name] CNM, +FHR Assessment: Michelle [name] is  21 y.o. [phone]  at [redacted]w[redacted]d presents with decreased fetal movement and cramping.  Plan: Discharge home F/u [facility] Fetal movement counts info and safe meds.   Michelle [name] 9/26/201311:30 PM

## 2012-04-11 NOTE — MAU Note (Signed)
Around 2pm started having pelvic pressure. Around 4-5pm today contractions started. Pain started in the abdomen then spread to back. Decrease fetal movement. No movement felt since last night 1030pm.

## 2012-04-11 NOTE — MAU Note (Signed)
Fetal heartbeat visualized by b/s ultrasound performed by Wynelle Bourgeois CNM

## 2012-04-13 NOTE — MAU Provider Note (Signed)
I have seen this patient and agree with the above resident's note.  LEFTWICH-KIRBY, Cortney Beissel Certified Nurse-Midwife 

## 2012-04-18 ENCOUNTER — Encounter: Payer: Self-pay | Admitting: Family Medicine

## 2012-04-25 ENCOUNTER — Ambulatory Visit (INDEPENDENT_AMBULATORY_CARE_PROVIDER_SITE_OTHER): Payer: Medicaid Other | Admitting: Family Medicine

## 2012-04-25 VITALS — BP 111/63 | Temp 98.1°F | Wt 156.0 lb

## 2012-04-25 DIAGNOSIS — Z3480 Encounter for supervision of other normal pregnancy, unspecified trimester: Secondary | ICD-10-CM

## 2012-04-25 NOTE — Patient Instructions (Addendum)
I think it is great that you are considering quitting smoking.  Please call us or go to [facility] if you have any problems with contractions that occur every 20 minutes and last for 2 hours, if your water breaks, if you have vaginal bleeding or if the baby is not moving well. We will set up an appt with Dr Raymondo Band. Please come back and see Dr. Berline Chough in 2 weeks.  We will check your sugar test at that time, and will set up your ultrasound appointment then.

## 2012-04-25 NOTE — Progress Notes (Signed)
Michelle [name] is a 21 y.o. G3P2 at [redacted]w[redacted]d for routine follow up.  She reports back pain, present from prior to pregnancy.  Unrelieved with Tylenol.  Does take Tramadol at times.  No maternity belt used.  See flow sheet for details.  Denies vaginal discharge.  A/P: Pregnancy at [redacted]w[redacted]d.  Doing well.   Pregnancy issues include low lying placenta - needs repeat ultrasound. Can schedule at next visit. PTSD/depression - pt reports she is doing fine now.  Denies SI Smoking - Interested in quitting smoking.  Has tried. Willing to meet with Dr. Raymondo Band. H/o GDM previous pregnancy -passed early glucola.  Repeat next visit Childbirth and education classes were offered. Preterm labor precautions reviewed. Follow up 2 weeks.

## 2012-05-10 ENCOUNTER — Encounter: Payer: Medicaid Other | Admitting: Sports Medicine

## 2012-05-13 ENCOUNTER — Ambulatory Visit (INDEPENDENT_AMBULATORY_CARE_PROVIDER_SITE_OTHER): Payer: Medicaid Other | Admitting: Family Medicine

## 2012-05-13 VITALS — BP 100/62 | Temp 97.7°F | Wt 159.6 lb

## 2012-05-13 DIAGNOSIS — O9989 Other specified diseases and conditions complicating pregnancy, childbirth and the puerperium: Secondary | ICD-10-CM

## 2012-05-13 DIAGNOSIS — O26899 Other specified pregnancy related conditions, unspecified trimester: Secondary | ICD-10-CM

## 2012-05-13 DIAGNOSIS — Z3480 Encounter for supervision of other normal pregnancy, unspecified trimester: Secondary | ICD-10-CM

## 2012-05-13 DIAGNOSIS — Z3490 Encounter for supervision of normal pregnancy, unspecified, unspecified trimester: Secondary | ICD-10-CM

## 2012-05-13 LAB — POCT URINALYSIS DIPSTICK
Bilirubin, UA: NEGATIVE
Blood, UA: NEGATIVE
Glucose, UA: NEGATIVE
Ketones, UA: NEGATIVE
Nitrite, UA: NEGATIVE
Protein, UA: NEGATIVE
Spec Grav, UA: 1.015
Urobilinogen, UA: 0.2
pH, UA: 7.5

## 2012-05-13 LAB — POCT UA - MICROSCOPIC ONLY: Epithelial cells, urine per micros: <5

## 2012-05-13 NOTE — Progress Notes (Signed)
Pt presents with cramping since 8am.  Has had diarrhea last two nights.  Slightly increased fetal movement.  Cramping is sporadic.  No bleeding/discharge.  Is trying to drink plenty of fluids.  No fevers/chills.  Pt is mildly unwell appearing but in no distress.  Exam is otherwise per flowsheet.  Urine does not show obvious signs of infection.  A/P: Rec increased fluid intake and, if cramping becomes organized and happening more often than 4 times per hour, to go to MAU for monitoring.

## 2012-05-16 ENCOUNTER — Ambulatory Visit (INDEPENDENT_AMBULATORY_CARE_PROVIDER_SITE_OTHER): Payer: Medicaid Other | Admitting: Sports Medicine

## 2012-05-16 VITALS — BP 129/79 | Temp 98.7°F | Wt 166.2 lb

## 2012-05-16 DIAGNOSIS — Z3490 Encounter for supervision of normal pregnancy, unspecified, unspecified trimester: Secondary | ICD-10-CM

## 2012-05-16 DIAGNOSIS — Z3480 Encounter for supervision of other normal pregnancy, unspecified trimester: Secondary | ICD-10-CM

## 2012-05-16 DIAGNOSIS — R8271 Bacteriuria: Secondary | ICD-10-CM

## 2012-05-16 DIAGNOSIS — O99891 Other specified diseases and conditions complicating pregnancy: Secondary | ICD-10-CM

## 2012-05-16 LAB — RPR

## 2012-05-16 LAB — CBC
HCT: 33.3 % — ABNORMAL LOW (ref 36.0–46.0)
Hemoglobin: 11.7 g/dL — ABNORMAL LOW (ref 12.0–15.0)
MCH: 32.1 pg (ref 26.0–34.0)
MCHC: 35.1 g/dL (ref 30.0–36.0)
MCV: 91.2 fL (ref 78.0–100.0)
Platelets: 184 10*3/uL (ref 150–400)
RBC: 3.65 MIL/uL — ABNORMAL LOW (ref 3.87–5.11)
RDW: 12.3 % (ref 11.5–15.5)
WBC: 16.4 10*3/uL — ABNORMAL HIGH (ref 4.0–10.5)

## 2012-05-16 MED ORDER — SULFAMETHOXAZOLE-TRIMETHOPRIM 800-160 MG PO TABS: 1.0000 | ORAL_TABLET | Freq: Two times a day (BID) | ORAL | Status: DC

## 2012-05-16 MED ORDER — NITROFURANTOIN MONOHYD MACRO 100 MG PO CAPS: 100.0000 mg | ORAL_CAPSULE | Freq: Every day | ORAL | Status: DC

## 2012-05-16 NOTE — Patient Instructions (Addendum)
It was good to see you.  Lots todo:  1. Follow up ultrasound 2. Follow up with Dr. Raymondo Band about smoking 3. Same day we will do a 3 hour sugar test.  You need to be fasted before coming in. 4. We are checking labs today. 5. Please take the Bactrim for 7 days.  Then macrobid everyday after that until pregnancy ends for the bacteria in your urine.  6. Follow up with me in 2 weeks.  Urinary Tract Infection in Pregnancy A urinary tract infection (UTI) is a bacterial infection of the urinary tract. Infection of the urinary tract can include the ureters, kidneys (pyelonephritis), bladder (cystitis), and urethra (urethritis). All pregnant women should be screened for bacteria in the urinary tract. Identifying and treating a UTI will decrease the risk of preterm labor and developing more serious infections in both the mother and baby. CAUSES Bacteria germs cause almost all UTIs. There are many factors that can increase your chances of getting a UTI during pregnancy. These include:  Having a short urethra.  Poor toilet and hygiene habits.  Sexual intercourse.  Blockage of urine along the urinary tract.  Problems with the pelvic muscles or nerves.  Diabetes.  Obesity.  Bladder problems after having several children.  Previous history of UTI. SYMPTOMS   Pain, burning, or a stinging feeling when urinating.  Suddenly feeling the need to urinate right away (urgency).  Loss of bladder control (urinary incontinence).  Frequent urination, more than is common with pregnancy.  Lower abdominal or back discomfort.  Bad smelling urine.  Cloudy urine.  Blood in the urine (hematuria).  Fever. When the kidneys are infected, the symptoms may be:  Back pain.  Flank pain on the right side more so than the left.  Fever.  Chills.  Nausea.  Vomiting. DIAGNOSIS   Urine tests.  Additional tests and procedures may include:  Ultrasound of the kidneys, ureters, bladder, and  urethra.  Looking in the bladder with a lighted tube (cystoscopy).  Certain X-ray studies only when absolutely necessary. Finding out the results of your test Ask when your test results will be ready. Make sure you get your test results. TREATMENT  Antibiotic medicine by mouth.  Antibiotics given through the vein (intravenously), if needed. HOME CARE INSTRUCTIONS   Take your antibiotics as directed. Finish them even if you start to feel better. Only take medicine as directed by your caregiver.  Drink enough fluids to keep your urine clear or pale yellow.  Do not have sexual intercourse until the infection is gone and your caregiver says it is okay.  Make sure you are tested for UTIs throughout your pregnancy if you get one. These infections often come back. Preventing a UTI in the future:  Practice good toilet habits. Always wipe from front to back. Use the tissue only once.  Do not hold your urine. Empty your bladder as soon as possible when the urge comes.  Do not douche or use deodorant sprays.  Wash with soap and warm water around the genital area and the anus.  Empty your bladder before and after sexual intercourse.  Wear underwear with a cotton crotch.  Avoid caffeine and carbonated drinks. They can irritate the bladder.  Drink cranberry juice or take cranberry pills. This may decrease the risk of getting a UTI.  Do not drink alcohol.  Keep all your appointments and tests as scheduled. SEEK MEDICAL CARE IF:   Your symptoms get worse.  You are still having fevers 2  or more days after treatment begins.  You develop a rash.  You feel that you are having problems with medicines prescribed.  You develop abnormal vaginal discharge. SEEK IMMEDIATE MEDICAL CARE IF:   You develop back or flank pain.  You develop chills.  You have blood in your urine.  You develop nausea and vomiting.  You develop contractions of your uterus.  You have a gush of fluid  from the vagina. MAKE SURE YOU:   Understand these instructions.  Will watch your condition.  Will get help right away if you are not doing well or get worse. Document Released: 10/28/2010 Document Revised: 09/25/2011 Document Reviewed: 10/28/2010 [facility] Patient Information 2013 [place], [place].    Pregnancy - Third Trimester The third trimester of pregnancy (the last 3 months) is a period of the most rapid growth for you and your baby. The baby approaches a length of 20 inches and a weight of 6 to 10 pounds. The baby is adding on fat and getting ready for life outside your body. While inside, babies have periods of sleeping and waking, suck their thumbs, and hiccups. You can often feel small contractions of the uterus. This is false labor. It is also called Braxton-Hicks contractions. This is like a practice for labor. The usual problems in this stage of pregnancy include more difficulty breathing, swelling of the hands and feet from water retention, and having to urinate more often because of the uterus and baby pressing on your bladder.  PRENATAL EXAMS  Blood work may continue to be done during prenatal exams. These tests are done to check on your health and the probable health of your baby. Blood work is used to follow your blood levels (hemoglobin). Anemia (low hemoglobin) is common during pregnancy. Iron and vitamins are given to help prevent this. You may also continue to be checked for diabetes. Some of the past blood tests may be done again.  The size of the uterus is measured during each visit. This makes sure your baby is growing properly according to your pregnancy dates.  Your blood pressure is checked every prenatal visit. This is to make sure you are not getting toxemia.  Your urine is checked every prenatal visit for infection, diabetes and protein.  Your weight is checked at each visit. This is done to make sure gains are happening at the suggested rate and that you and  your baby are growing normally.  Sometimes, an ultrasound is performed to confirm the position and the proper growth and development of the baby. This is a test done that bounces harmless sound waves off the baby so your caregiver can more accurately determine due dates.  Discuss the type of pain medication and anesthesia you will have during your labor and delivery.  Discuss the possibility and anesthesia if a Cesarean Section might be necessary.  Inform your caregiver if there is any mental or physical violence at home. Sometimes, a specialized non-stress test, contraction stress test and biophysical profile are done to make sure the baby is not having a problem. Checking the amniotic fluid surrounding the baby is called an amniocentesis. The amniotic fluid is removed by sticking a needle into the belly (abdomen). This is sometimes done near the end of pregnancy if an early delivery is required. In this case, it is done to help make sure the baby's lungs are mature enough for the baby to live outside of the womb. If the lungs are not mature and it is unsafe to  deliver the baby, an injection of cortisone medication is given to the mother 1 to 2 days before the delivery. This helps the baby's lungs mature and makes it safer to deliver the baby. CHANGES OCCURING IN THE THIRD TRIMESTER OF PREGNANCY Your body goes through many changes during pregnancy. They vary from person to person. Talk to your caregiver about changes you notice and are concerned about.  During the last trimester, you have probably had an increase in your appetite. It is normal to have cravings for certain foods. This varies from person to person and pregnancy to pregnancy.  You may begin to get stretch marks on your hips, abdomen, and breasts. These are normal changes in the body during pregnancy. There are no exercises or medications to take which prevent this change.  Constipation may be treated with a stool softener or adding  bulk to your diet. Drinking lots of fluids, fiber in vegetables, fruits, and whole grains are helpful.  Exercising is also helpful. If you have been very active up until your pregnancy, most of these activities can be continued during your pregnancy. If you have been less active, it is helpful to start an exercise program such as walking. Consult your caregiver before starting exercise programs.  Avoid all smoking, alcohol, un-prescribed drugs, herbs and "street drugs" during your pregnancy. These chemicals affect the formation and growth of the baby. Avoid chemicals throughout the pregnancy to ensure the delivery of a healthy infant.  Backache, varicose veins and hemorrhoids may develop or get worse.  You will tire more easily in the third trimester, which is normal.  The baby's movements may be stronger and more often.  You may become short of breath easily.  Your belly button may stick out.  A yellow discharge may leak from your breasts called colostrum.  You may have a bloody mucus discharge. This usually occurs a few days to a week before labor begins. HOME CARE INSTRUCTIONS   Keep your caregiver's appointments. Follow your caregiver's instructions regarding medication use, exercise, and diet.  During pregnancy, you are providing food for you and your baby. Continue to eat regular, well-balanced meals. Choose foods such as meat, fish, milk and other low fat dairy products, vegetables, fruits, and whole-grain breads and cereals. Your caregiver will tell you of the ideal weight gain.  A physical sexual relationship may be continued throughout pregnancy if there are no other problems such as early (premature) leaking of amniotic fluid from the membranes, vaginal bleeding, or belly (abdominal) pain.  Exercise regularly if there are no restrictions. Check with your caregiver if you are unsure of the safety of your exercises. Greater weight gain will occur in the last 2 trimesters of  pregnancy. Exercising helps:  Control your weight.  Get you in shape for labor and delivery.  You lose weight after you deliver.  Rest a lot with legs elevated, or as needed for leg cramps or low back pain.  Wear a good support or jogging bra for breast tenderness during pregnancy. This may help if worn during sleep. Pads or tissues may be used in the bra if you are leaking colostrum.  Do not use hot tubs, steam rooms, or saunas.  Wear your seat belt when driving. This protects you and your baby if you are in an accident.  Avoid raw meat, cat litter boxes and soil used by cats. These carry germs that can cause birth defects in the baby.  It is easier to loose urine during pregnancy.  Tightening up and strengthening the pelvic muscles will help with this problem. You can practice stopping your urination while you are going to the bathroom. These are the same muscles you need to strengthen. It is also the muscles you would use if you were trying to stop from passing gas. You can practice tightening these muscles up 10 times a set and repeating this about 3 times per day. Once you know what muscles to tighten up, do not perform these exercises during urination. It is more likely to cause an infection by backing up the urine.  Ask for help if you have financial, counseling or nutritional needs during pregnancy. Your caregiver will be able to offer counseling for these needs as well as refer you for other special needs.  Make a list of emergency phone numbers and have them available.  Plan on getting help from family or friends when you go home from the hospital.  Make a trial run to the hospital.  Take prenatal classes with the father to understand, practice and ask questions about the labor and delivery.  Prepare the baby's room/nursery.  Do not travel out of the city unless it is absolutely necessary and with the advice of your caregiver.  Wear only low or no heal shoes to have better  balance and prevent falling. MEDICATIONS AND DRUG USE IN PREGNANCY  Take prenatal vitamins as directed. The vitamin should contain 1 milligram of folic acid. Keep all vitamins out of reach of children. Only a couple vitamins or tablets containing iron may be fatal to a baby or young child when ingested.  Avoid use of all medications, including herbs, over-the-counter medications, not prescribed or suggested by your caregiver. Only take over-the-counter or prescription medicines for pain, discomfort, or fever as directed by your caregiver. Do not use aspirin, ibuprofen (Motrin, Advil, Nuprin) or naproxen (Aleve) unless OK'd by your caregiver.  Let your caregiver also know about herbs you may be using.  Alcohol is related to a number of birth defects. This includes fetal alcohol syndrome. All alcohol, in any form, should be avoided completely. Smoking will cause low birth rate and premature babies.  Street/illegal drugs are very harmful to the baby. They are absolutely forbidden. A baby born to an addicted mother will be addicted at birth. The baby will go through the same withdrawal an adult does. SEEK MEDICAL CARE IF: You have any concerns or worries during your pregnancy. It is better to call with your questions if you feel they cannot wait, rather than worry about them. DECISIONS ABOUT CIRCUMCISION You may or may not know the sex of your baby. If you know your baby is a boy, it may be time to think about circumcision. Circumcision is the removal of the foreskin of the penis. This is the skin that covers the sensitive end of the penis. There is no proven medical need for this. Often this decision is made on what is popular at the time or based upon religious beliefs and social issues. You can discuss these issues with your caregiver or pediatrician. SEEK IMMEDIATE MEDICAL CARE IF:   An unexplained oral temperature above 102 F (38.9 C) develops, or as your caregiver suggests.  You have  leaking of fluid from the vagina (birth canal). If leaking membranes are suspected, take your temperature and tell your caregiver of this when you call.  There is vaginal spotting, bleeding or passing clots. Tell your caregiver of the amount and how many pads are used.  You develop a bad smelling vaginal discharge with a change in the color from clear to white.  You develop vomiting that lasts more than 24 hours.  You develop chills or fever.  You develop shortness of breath.  You develop burning on urination.  You loose more than 2 pounds of weight or gain more than 2 pounds of weight or as suggested by your caregiver.  You notice sudden swelling of your face, hands, and feet or legs.  You develop belly (abdominal) pain. Round ligament discomfort is a common non-cancerous (benign) cause of abdominal pain in pregnancy. Your caregiver still must evaluate you.  You develop a severe headache that does not go away.  You develop visual problems, blurred or double vision.  If you have not felt your baby move for more than 1 hour. If you think the baby is not moving as much as usual, eat something with sugar in it and lie down on your left side for an hour. The baby should move at least 4 to 5 times per hour. Call right away if your baby moves less than that.  You fall, are in a car accident or any kind of trauma.  There is mental or physical violence at home. Document Released: 06/27/2001 Document Revised: 09/25/2011 Document Reviewed: 12/30/2008 [facility] Patient Information 2013 [place], [place].

## 2012-05-16 NOTE — Progress Notes (Signed)
Feeling much better.  Fewer contractions.  Good movement. 2nd case of Bacturia.  Will start prophylaxis following treatment.  Culture pending.  Staph species will treat with Bactrim.  Then macrobid daily Repeat US for low lying placenta ordered today 28 Week labs collected today 3 hour Glucola ordered for next week given prior hx of GDM Follow up with Dr. Raymondo Band for smoking cessation. Follow up with me in 2 weeks. Seen in OB clinic @ 25w

## 2012-05-16 NOTE — Assessment & Plan Note (Signed)
Tx with Bactrim given Staph Species.  Sensitives pending.  Will follow cultures. Second episode.  Daily macrobid rx'd

## 2012-05-17 ENCOUNTER — Telehealth: Payer: Self-pay | Admitting: *Deleted

## 2012-05-17 LAB — HIV ANTIBODY (ROUTINE TESTING W REFLEX): HIV: NONREACTIVE

## 2012-05-17 LAB — CULTURE, OB URINE: Colony Count: >=100000

## 2012-05-17 NOTE — Telephone Encounter (Signed)
LM with patient's mother to inform patient to call back

## 2012-05-17 NOTE — Telephone Encounter (Signed)
Message copied by Farrell Ours on Fri May 17, 2012  8:42 AM ------      Message from: Gaspar Bidding D      Created: Fri May 17, 2012  6:29 AM       Please call and let her know her hemoglobin is fine.  Normal level for pregnancy.  Continue prenatal vitamin

## 2012-05-21 ENCOUNTER — Telehealth: Payer: Self-pay | Admitting: Sports Medicine

## 2012-05-21 ENCOUNTER — Ambulatory Visit (HOSPITAL_COMMUNITY)
Admission: RE | Admit: 2012-05-21 | Discharge: 2012-05-21 | Disposition: A | Discharge status: Home / Self Care | Payer: Medicaid Other | Source: Ambulatory Visit | Attending: Family Medicine | Admitting: Family Medicine

## 2012-05-21 ENCOUNTER — Encounter: Payer: Self-pay | Admitting: Sports Medicine

## 2012-05-21 DIAGNOSIS — O99330 Smoking (tobacco) complicating pregnancy, unspecified trimester: Secondary | ICD-10-CM | POA: Insufficient documentation

## 2012-05-21 DIAGNOSIS — O4400 Placenta previa specified as without hemorrhage, unspecified trimester: Secondary | ICD-10-CM | POA: Insufficient documentation

## 2012-05-21 DIAGNOSIS — Z3490 Encounter for supervision of normal pregnancy, unspecified, unspecified trimester: Secondary | ICD-10-CM

## 2012-05-21 NOTE — Telephone Encounter (Signed)
Patient is calling for the results of her ultrasound.

## 2012-05-22 ENCOUNTER — Other Ambulatory Visit: Payer: Medicaid Other

## 2012-05-22 DIAGNOSIS — Z3490 Encounter for supervision of normal pregnancy, unspecified, unspecified trimester: Secondary | ICD-10-CM

## 2012-05-22 LAB — GLUCOSE, CAPILLARY: Glucose-Capillary: 84 mg/dL (ref 70–99)

## 2012-05-22 NOTE — Progress Notes (Signed)
3 HR GTT DONE TODAY Kenedie Dirocco 

## 2012-05-23 LAB — GLUCOSE TOLERANCE, 3 HOURS
Glucose Tolerance, 1 hour: 177 mg/dL (ref 70–189)
Glucose Tolerance, 2 hour: 122 mg/dL (ref 70–164)
Glucose Tolerance, Fasting: 73 mg/dL (ref 70–104)
Glucose, GTT - 3 Hour: 87 mg/dL (ref 70–144)

## 2012-05-28 NOTE — Telephone Encounter (Signed)
To be addressed at next visit.

## 2012-05-29 ENCOUNTER — Ambulatory Visit (INDEPENDENT_AMBULATORY_CARE_PROVIDER_SITE_OTHER): Payer: Medicaid Other | Admitting: Sports Medicine

## 2012-05-29 VITALS — BP 125/74 | Temp 97.7°F | Wt 172.2 lb

## 2012-05-29 DIAGNOSIS — O99891 Other specified diseases and conditions complicating pregnancy: Secondary | ICD-10-CM

## 2012-05-29 DIAGNOSIS — Z3480 Encounter for supervision of other normal pregnancy, unspecified trimester: Secondary | ICD-10-CM

## 2012-05-29 DIAGNOSIS — R8271 Bacteriuria: Secondary | ICD-10-CM

## 2012-05-29 LAB — POCT URINALYSIS DIPSTICK
Bilirubin, UA: NEGATIVE
Blood, UA: NEGATIVE
Glucose, UA: NEGATIVE
Ketones, UA: NEGATIVE
Nitrite, UA: NEGATIVE
Protein, UA: NEGATIVE
Spec Grav, UA: 1.015
Urobilinogen, UA: 0.2
pH, UA: 7.5

## 2012-05-29 LAB — POCT UA - MICROSCOPIC ONLY

## 2012-05-29 NOTE — Patient Instructions (Addendum)
It was good to see you.  You have a cold.  For treatment you can use:  1. Tylenol for body aches  2. Saline rinse up to 6 times per day  3. Mucinex (guaifenesin) for helping break up your cough.    Call us if you have any fevers, chills, nausea or vomiting.  Drink more water. Stop smoking!  Pregnancy If you are planning on getting pregnant, it is a good idea to make a preconception appointment with your caregiver to discuss having a healthy lifestyle before getting pregnant. This includes diet, weight, exercise, taking prenatal vitamins (especially folic acid, which helps prevent brain and spinal cord defects), avoiding alcohol, smoking and illegal drugs, medical problems (diabetes, convulsions), family history of genetic problems, working conditions, and immunizations. It is better to have knowledge of these things and do something about them before getting pregnant. During your pregnancy, it is important to follow certain guidelines in order to have a healthy baby. It is very important to get good prenatal care and follow your caregiver's instructions. Prenatal care includes all the medical care you receive before your baby's birth. This helps to prevent problems during the pregnancy and childbirth. HOME CARE INSTRUCTIONS   Start your prenatal visits by the 12th week of pregnancy or earlier, if possible. At first, appointments are usually scheduled monthly. They become more frequent in the last 2 months before delivery. It is important that you keep your caregiver's appointments and follow your caregiver's instructions regarding medication use, exercise, and diet.  During pregnancy, you are providing food for you and your baby. Eat a regular, well-balanced diet. Choose foods such as meat, fish, milk and other dairy products, vegetables, fruits, whole-grain breads and cereals. Your caregiver will inform you of the ideal weight gain depending on your current height and weight. Drink lots of  liquids. Try to drink 8 glasses of water a day.  Alcohol is associated with a number of birth defects including fetal alcohol syndrome. It is best to avoid alcohol completely. Smoking will cause low birth rate and prematurity. Use of alcohol and nicotine during your pregnancy also increases the chances that your child will be chemically dependent later in their life and may contribute to SIDS (Sudden Infant Death Syndrome).  Do not use illegal drugs.  Only take prescription or over-the-counter medications that are recommended by your caregiver. Other medications can cause genetic and physical problems in the baby.  Morning sickness can often be helped by keeping soda crackers at the bedside. Eat a few before getting up in the morning.  A sexual relationship may be continued until near the end of pregnancy if there are no other problems such as early (premature) leaking of amniotic fluid from the membranes, vaginal bleeding, painful intercourse or belly (abdominal) pain.  Exercise regularly. Check with your caregiver if you are unsure of the safety of some of your exercises.  Do not use hot tubs, steam rooms or saunas. These increase the risk of fainting and hurting yourself and the baby. Swimming is OK for exercise. Get plenty of rest, including afternoon naps when possible, especially in the third trimester.  Avoid toxic odors and chemicals.  Do not wear high heels. They may cause you to lose your balance and fall.  Do not lift over 5 pounds. If you do lift anything, lift with your legs and thighs, not your back.  Avoid long trips, especially in the third trimester.  If you have to travel out of the city or  state, take a copy of your medical records with you. SEEK IMMEDIATE MEDICAL CARE IF:   You develop an unexplained oral temperature above 102 F (38.9 C), or as your caregiver suggests.  You have leaking of fluid from the vagina. If leaking membranes are suspected, take your  temperature and inform your caregiver of this when you call.  There is vaginal spotting or bleeding. Notify your caregiver of the amount and how many pads are used.  You continue to feel sick to your stomach (nauseous) and have no relief from remedies suggested, or you throw up (vomit) blood or coffee ground like materials.  You develop upper abdominal pain.  You have round ligament discomfort in the lower abdominal area. This still must be evaluated by your caregiver.  You feel contractions of the uterus.  You do not feel the baby move, or there is less movement than before.  You have painful urination.  You have abnormal vaginal discharge.  You have persistent diarrhea.  You get a severe headache.  You have problems with your vision.  You develop muscle weakness.  You feel dizzy and faint.  You develop shortness of breath.  You develop chest pain.  You have back pain that travels down to your leg and feet.  You feel irregular or a very fast heartbeat.  You develop excessive weight gain in a short period of time (5 pounds in 3 to 5 days).  You are involved in a domestic violence situation. Document Released: 07/03/2005 Document Revised: 01/02/2012 Document Reviewed: 12/25/2008 [facility] Patient Information 2013 [place], [place].

## 2012-05-29 NOTE — Assessment & Plan Note (Signed)
F/u UA and culture today

## 2012-05-31 LAB — CULTURE, OB URINE
Colony Count: NO GROWTH
Organism ID, Bacteria: NO GROWTH

## 2012-06-02 ENCOUNTER — Encounter (HOSPITAL_COMMUNITY): Payer: Self-pay | Admitting: *Deleted

## 2012-06-02 ENCOUNTER — Inpatient Hospital Stay (HOSPITAL_COMMUNITY)
Admission: AD | Admit: 2012-06-02 | Discharge: 2012-06-02 | Disposition: A | Discharge status: Home / Self Care | Payer: Medicaid Other | Source: Ambulatory Visit | Attending: Obstetrics and Gynecology | Admitting: Obstetrics and Gynecology

## 2012-06-02 DIAGNOSIS — J069 Acute upper respiratory infection, unspecified: Secondary | ICD-10-CM

## 2012-06-02 DIAGNOSIS — J329 Chronic sinusitis, unspecified: Secondary | ICD-10-CM | POA: Insufficient documentation

## 2012-06-02 DIAGNOSIS — Z3490 Encounter for supervision of normal pregnancy, unspecified, unspecified trimester: Secondary | ICD-10-CM

## 2012-06-02 DIAGNOSIS — J40 Bronchitis, not specified as acute or chronic: Secondary | ICD-10-CM

## 2012-06-02 DIAGNOSIS — R05 Cough: Secondary | ICD-10-CM | POA: Insufficient documentation

## 2012-06-02 DIAGNOSIS — O99891 Other specified diseases and conditions complicating pregnancy: Secondary | ICD-10-CM | POA: Insufficient documentation

## 2012-06-02 DIAGNOSIS — R059 Cough, unspecified: Secondary | ICD-10-CM | POA: Insufficient documentation

## 2012-06-02 LAB — URINE MICROSCOPIC-ADD ON

## 2012-06-02 LAB — URINALYSIS, ROUTINE W REFLEX MICROSCOPIC
Bilirubin Urine: NEGATIVE
Glucose, UA: NEGATIVE mg/dL
Hgb urine dipstick: NEGATIVE
Ketones, ur: NEGATIVE mg/dL
Nitrite: NEGATIVE
Protein, ur: NEGATIVE mg/dL
Specific Gravity, Urine: <1.005 — ABNORMAL LOW (ref 1.005–1.030)
Urobilinogen, UA: 0.2 mg/dL (ref 0.0–1.0)
pH: 7 (ref 5.0–8.0)

## 2012-06-02 MED ORDER — ONDANSETRON HCL 4 MG PO TABS: 4.0000 mg | ORAL_TABLET | Freq: Four times a day (QID) | ORAL | Status: DC

## 2012-06-02 MED ORDER — OXYMETAZOLINE HCL 0.05 % NA SOLN: 1.0000 | Freq: Two times a day (BID) | NASAL | Status: DC

## 2012-06-02 MED ORDER — ALBUTEROL SULFATE HFA 108 (90 BASE) MCG/ACT IN AERS: 2.0000 | INHALATION_SPRAY | RESPIRATORY_TRACT | Status: DC | PRN

## 2012-06-02 MED ORDER — AZITHROMYCIN 250 MG PO TABS: 250.0000 mg | ORAL_TABLET | Freq: Every day | ORAL | Status: DC

## 2012-06-02 MED ORDER — SPACER/AERO-HOLDING CHAMBERS DEVI: Status: DC

## 2012-06-02 NOTE — Progress Notes (Signed)
Pt reports cold like symptoms X 3 days.  Cough, nasal congestion.  Denies fevers, chills, nausea or vomiting.  Lungs CTA, mild nasal exudate, mildly dry mucous membranes.  No focal signs of infection.  Reviewed appropriate sx treatments in pregnancy. Pt to call if worsening symptoms vs starts running fevers.  No fetal tachycardia, normal fetal movement. Rechecked Urine.  On suppressive therapy for recurrent Bacturia. +1 Leuk, neg culture Reviewed 3 hour results and repeat US = normal Slight swelling on exam.  Encouraged to drink water.  BP adequate, no changes in vision, no headaches other than congestion related Pt continues to smoke, has not followed up with Dr. Raymondo Band.  Counseled again to quit Follow up in 2 weeks.

## 2012-06-02 NOTE — Progress Notes (Signed)
Report given to Ginger, Charity fundraiser. Current Rn asked to transfer back to Antenatal with a different patient.

## 2012-06-02 NOTE — MAU Provider Note (Signed)
History     CSN: [id number]  Arrival date and time: 06/02/12 1749   None     Chief Complaint  Patient presents with  . Emesis  . Cough   HPI 21 y.o. G3P2002 at [redacted]w[redacted]d with cough and sinus congestion > 1 week. Sinus headache. Green/brown sputum. Chest a little tight, some difficulty breathing and wheezing. Has to sleep with extra pillow or feels harder to breathe at night. Left side of sinuses congested, cannot expel mucous. Postnasal drainage, thick mucous, gagging at times. Nausea and vomiting since last night. Emesis x 6 after eating. Keeping some fluids down but hard to swallow, thinks it is because of mucous. Taking Mucinex in daytime, benadryl at night.  No fever/chills. No diarrhea/constipation. Just finished bactrim for UTI. On macrobid for suppression.   OB History    Grav Para Term Preterm Abortions TAB SAB Ect Mult Living   3 2 2       2      GDM both prior pregnancies.   Past Medical History  Diagnosis Date  . Tobacco abuse   . Bipolar I disorder   . PTSD (post-traumatic stress disorder)   . Post partum depression   . Menorrhagia   . 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 Gestational diabetes   . Pregnant   . Chronic headaches     Past Surgical History  Procedure Date  . No past surgeries     Family History  Problem Relation Age of Onset  . Breast cancer Paternal Grandmother   . Colon cancer Paternal Grandfather   . Colon cancer Maternal Grandfather   . Colon polyps Maternal Aunt   . Diabetes Mother     maternal aunts and uncles  . Diabetes Sister   . Diabetes Father   . Heart disease Father   Asthma - sister, mom, dad, MGF  History  Substance Use Topics  . Smoking status: Current Every Day Smoker -- 0.5 packs/day    Types: Cigarettes  . Smokeless tobacco: Never Used     Comment: decreased smoking   . Alcohol Use: No     Comment: occasional    Allergies:  Allergies  Allergen Reactions  . Codeine Anaphylaxis and Hives     Prescriptions prior to admission  Medication Sig Dispense Refill  . diphenhydrAMINE (BENADRYL) 25 mg capsule Take 50 mg by mouth at bedtime as needed. sleep      . guaiFENesin (MUCINEX) 600 MG 12 hr tablet Take 600 mg by mouth 2 (two) times daily.      . nitrofurantoin, macrocrystal-monohydrate, (MACROBID) 100 MG capsule Take 1 capsule (100 mg total) by mouth daily.  30 capsule  5  . Prenatal Vit-Fe Fumarate-FA (PRENATAL MULTIVITAMIN) TABS Take 1 tablet by mouth at bedtime.       . traMADol (ULTRAM) 50 MG tablet Take 50 mg by mouth every 6 (six) hours as needed. For pain      . sulfamethoxazole-trimethoprim (BACTRIM DS) 800-160 MG per tablet Take 1 tablet by mouth 2 (two) times daily.  14 tablet  0    Review of Systems  Constitutional: Negative for fever and chills.  HENT: Positive for congestion.   Eyes: Negative for blurred vision and double vision.  Respiratory: Positive for cough, sputum production and wheezing.   Cardiovascular: Negative for chest pain.  Gastrointestinal: Positive for nausea and vomiting. Negative for abdominal pain, diarrhea and constipation.  Genitourinary: Negative for dysuria and urgency.  Musculoskeletal:  Positive for back pain.  Neurological: Positive for dizziness. Negative for headaches.   Physical Exam   Blood pressure 127/84, pulse 96, temperature 98.1 F (36.7 C), temperature source Oral, resp. rate 18, height 5\' 2"  (1.575 m), weight 77.565 kg (171 lb), last menstrual period 10/23/2011, SpO2 97.00%.  Physical Exam  Constitutional: She is oriented to person, place, and time. She appears well-developed and well-nourished. No distress.  HENT:  Head: Normocephalic and atraumatic.       Nasal congestion  Eyes: Conjunctivae normal and EOM are normal.  Neck: Normal range of motion. Neck supple.  Cardiovascular: Normal rate, regular rhythm and normal heart sounds.   Respiratory: Breath sounds normal. No respiratory distress. She has no wheezes.  GI:  Soft. There is no tenderness. There is no rebound and no guarding.  Musculoskeletal: Normal range of motion. She exhibits no edema and no tenderness.  Neurological: She is alert and oriented to person, place, and time.  Skin: Skin is warm and dry.  Psychiatric: She has a normal mood and affect.     NST:  130-135, moderate variability, accels present, no decels TOCO:  No contractions   MAU Course  Procedures   Assessment and Plan  21 y.o. G3P2002 at [redacted]w[redacted]d with URI, bronchitis/sinusitis - Azithromycin - Continue mucinex, stop benadryl. Discussed OTC meds and safety - Afrin + sinus rinse - Albuterol inhaler - Supportive care, fluids, zofran for nausea F/U in clinic in 1 week  Napoleon Form 06/02/2012, 7:25 PM

## 2012-06-02 NOTE — MAU Note (Signed)
Pt started having a cough since last Sat.Micah Flesher to Dr. Appointment on [place]. Told her if symptoms got worse to come to MAU. Pt c/o vomiting now not able to eat since yesterday. Reports "coughing is worse and her lungs feel like they are burning.

## 2012-06-10 ENCOUNTER — Encounter: Payer: Medicaid Other | Admitting: Sports Medicine

## 2012-06-19 ENCOUNTER — Inpatient Hospital Stay (HOSPITAL_COMMUNITY)
Admission: AD | Admit: 2012-06-19 | Discharge: 2012-06-23 | DRG: 765 | Disposition: A | Discharge status: Home / Self Care | Payer: Medicaid Other | Source: Ambulatory Visit | Attending: Obstetrics and Gynecology | Admitting: Obstetrics and Gynecology

## 2012-06-19 ENCOUNTER — Inpatient Hospital Stay (HOSPITAL_COMMUNITY): Payer: Medicaid Other

## 2012-06-19 ENCOUNTER — Encounter (HOSPITAL_COMMUNITY): Payer: Self-pay | Admitting: *Deleted

## 2012-06-19 ENCOUNTER — Ambulatory Visit (INDEPENDENT_AMBULATORY_CARE_PROVIDER_SITE_OTHER): Payer: Medicaid Other | Admitting: Sports Medicine

## 2012-06-19 ENCOUNTER — Encounter: Payer: Medicaid Other | Admitting: Sports Medicine

## 2012-06-19 VITALS — BP 130/92 | Temp 97.5°F | Wt 174.7 lb

## 2012-06-19 DIAGNOSIS — IMO0002 Reserved for concepts with insufficient information to code with codable children: Secondary | ICD-10-CM

## 2012-06-19 DIAGNOSIS — O1490 Unspecified pre-eclampsia, unspecified trimester: Secondary | ICD-10-CM

## 2012-06-19 DIAGNOSIS — O152 Eclampsia in the puerperium: Secondary | ICD-10-CM | POA: Diagnosis not present

## 2012-06-19 DIAGNOSIS — O159 Eclampsia, unspecified as to time period: Secondary | ICD-10-CM | POA: Diagnosis present

## 2012-06-19 DIAGNOSIS — Z3490 Encounter for supervision of normal pregnancy, unspecified, unspecified trimester: Secondary | ICD-10-CM

## 2012-06-19 DIAGNOSIS — F17200 Nicotine dependence, unspecified, uncomplicated: Secondary | ICD-10-CM

## 2012-06-19 DIAGNOSIS — O365990 Maternal care for other known or suspected poor fetal growth, unspecified trimester, not applicable or unspecified: Secondary | ICD-10-CM | POA: Diagnosis present

## 2012-06-19 DIAGNOSIS — O368390 Maternal care for abnormalities of the fetal heart rate or rhythm, unspecified trimester, not applicable or unspecified: Secondary | ICD-10-CM

## 2012-06-19 DIAGNOSIS — H5340 Unspecified visual field defects: Secondary | ICD-10-CM

## 2012-06-19 DIAGNOSIS — D689 Coagulation defect, unspecified: Secondary | ICD-10-CM | POA: Diagnosis not present

## 2012-06-19 DIAGNOSIS — O9913 Other diseases of the blood and blood-forming organs and certain disorders involving the immune mechanism complicating the puerperium: Secondary | ICD-10-CM | POA: Diagnosis not present

## 2012-06-19 DIAGNOSIS — O99891 Other specified diseases and conditions complicating pregnancy: Secondary | ICD-10-CM

## 2012-06-19 DIAGNOSIS — R8271 Bacteriuria: Secondary | ICD-10-CM

## 2012-06-19 DIAGNOSIS — Z98891 History of uterine scar from previous surgery: Secondary | ICD-10-CM

## 2012-06-19 DIAGNOSIS — O99893 Other specified diseases and conditions complicating puerperium: Secondary | ICD-10-CM | POA: Diagnosis not present

## 2012-06-19 DIAGNOSIS — D696 Thrombocytopenia, unspecified: Secondary | ICD-10-CM | POA: Diagnosis not present

## 2012-06-19 DIAGNOSIS — F4310 Post-traumatic stress disorder, unspecified: Secondary | ICD-10-CM

## 2012-06-19 HISTORY — DX: Bipolar disorder, unspecified: F31.9

## 2012-06-19 LAB — COMPREHENSIVE METABOLIC PANEL
ALT: 8 U/L (ref 0–35)
AST: 18 U/L (ref 0–37)
Albumin: 2.2 g/dL — ABNORMAL LOW (ref 3.5–5.2)
Alkaline Phosphatase: 120 U/L — ABNORMAL HIGH (ref 39–117)
BUN: 20 mg/dL (ref 6–23)
CO2: 20 mEq/L (ref 19–32)
Calcium: 9.6 mg/dL (ref 8.4–10.5)
Chloride: 103 mEq/L (ref 96–112)
Creatinine, Ser: 0.91 mg/dL (ref 0.50–1.10)
GFR calc Af Amer: >90 mL/min (ref >90)
GFR calc non Af Amer: 88 mL/min — ABNORMAL LOW (ref >90)
Glucose, Bld: 114 mg/dL — ABNORMAL HIGH (ref 70–99)
Potassium: 4.1 mEq/L (ref 3.5–5.1)
Sodium: 137 mEq/L (ref 135–145)
Total Bilirubin: 0.3 mg/dL (ref 0.3–1.2)
Total Protein: 5.6 g/dL — ABNORMAL LOW (ref 6.0–8.3)

## 2012-06-19 LAB — URINALYSIS, ROUTINE W REFLEX MICROSCOPIC
Bilirubin Urine: NEGATIVE
Glucose, UA: NEGATIVE mg/dL
Ketones, ur: NEGATIVE mg/dL
Leukocytes, UA: NEGATIVE
Nitrite: NEGATIVE
Protein, ur: >300 mg/dL — AB
Specific Gravity, Urine: 1.015 (ref 1.005–1.030)
Urobilinogen, UA: 0.2 mg/dL (ref 0.0–1.0)
pH: 6.5 (ref 5.0–8.0)

## 2012-06-19 LAB — CBC
HCT: 37.7 % (ref 36.0–46.0)
Hemoglobin: 13.3 g/dL (ref 12.0–15.0)
MCH: 32.8 pg (ref 26.0–34.0)
MCHC: 35.3 g/dL (ref 30.0–36.0)
MCV: 92.9 fL (ref 78.0–100.0)
Platelets: 115 10*3/uL — ABNORMAL LOW (ref 150–400)
RBC: 4.06 MIL/uL (ref 3.87–5.11)
RDW: 12.7 % (ref 11.5–15.5)
WBC: 16.4 10*3/uL — ABNORMAL HIGH (ref 4.0–10.5)

## 2012-06-19 LAB — PROTEIN / CREATININE RATIO, URINE
Creatinine, Urine: 188.8 mg/dL
Protein Creatinine Ratio: 3.29 — ABNORMAL HIGH (ref 0.00–0.15)
Total Protein, Urine: 621.6 mg/dL

## 2012-06-19 LAB — URINE MICROSCOPIC-ADD ON

## 2012-06-19 MED ORDER — DOCUSATE SODIUM 100 MG PO CAPS
100.0000 mg | ORAL_CAPSULE | Freq: Every day | ORAL | Status: DC
  Administered 2012-06-19: 100 mg via ORAL
  Filled 2012-06-19: qty 1

## 2012-06-19 MED ORDER — ACETAMINOPHEN 325 MG PO TABS
650.0000 mg | ORAL_TABLET | ORAL | Status: DC | PRN
  Administered 2012-06-19: 650 mg via ORAL
  Filled 2012-06-19: qty 2

## 2012-06-19 MED ORDER — DOCUSATE SODIUM 100 MG PO CAPS
100.0000 mg | ORAL_CAPSULE | Freq: Every day | ORAL | Status: DC
  Administered 2012-06-20: 100 mg via ORAL
  Filled 2012-06-19: qty 1

## 2012-06-19 MED ORDER — PRENATAL MULTIVITAMIN CH
1.0000 | ORAL_TABLET | Freq: Every day | ORAL | Status: DC
  Administered 2012-06-19: 1 via ORAL
  Filled 2012-06-19: qty 1

## 2012-06-19 MED ORDER — CALCIUM CARBONATE ANTACID 500 MG PO CHEW
2.0000 | CHEWABLE_TABLET | ORAL | Status: DC | PRN
  Administered 2012-06-20 (×2): 400 mg via ORAL
  Filled 2012-06-19 (×2): qty 2

## 2012-06-19 MED ORDER — LABETALOL HCL 200 MG PO TABS
200.0000 mg | ORAL_TABLET | Freq: Two times a day (BID) | ORAL | Status: DC
  Administered 2012-06-19 – 2012-06-23 (×7): 200 mg via ORAL
  Filled 2012-06-19 (×10): qty 1

## 2012-06-19 MED ORDER — BETAMETHASONE SOD PHOS & ACET 6 (3-3) MG/ML IJ SUSP
12.0000 mg | INTRAMUSCULAR | Status: DC
  Administered 2012-06-19: 12 mg via INTRAMUSCULAR
  Filled 2012-06-19 (×2): qty 2

## 2012-06-19 MED ORDER — ZOLPIDEM TARTRATE 5 MG PO TABS: 5.0000 mg | ORAL_TABLET | Freq: Every evening | ORAL | Status: DC | PRN

## 2012-06-19 MED ORDER — LACTATED RINGERS IV SOLN
INTRAVENOUS | Status: DC
  Administered 2012-06-19 – 2012-06-20 (×2): via INTRAVENOUS

## 2012-06-19 NOTE — MAU Provider Note (Signed)
Chief Complaint:  Headache and Leg Swelling   None     HPI: Michelle [name] is a 21 y.o. G3P2002 at 41w4dwho presents to maternity admissions from family practice with h/a, edema, and elevated BP.  She reports some visual disturbances, seeing spots, which has been occuring throughout the pregnancy.  She reports some epigastric burning, more central than on the right, all day today.  She reports good fetal movement, denies LOF, vaginal bleeding, vaginal itching/burning, urinary symptoms, h/a, dizziness, n/v, or fever/chills.     Past Medical History: Past Medical History  Diagnosis Date  . Tobacco abuse   . Bipolar I disorder   . PTSD (post-traumatic stress disorder)   . Post partum depression   . Menorrhagia   . 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 Gestational diabetes   . Pregnant   . Chronic headaches   . Bipolar disorder     Past obstetric history: OB History    Grav Para Term Preterm Abortions TAB SAB Ect Mult Living   3 2 2       2      # Outc Date GA Lbr Len/2nd Wgt Sex Del Anes PTL Lv   1 TRM 1/08 [redacted]w[redacted]d  6lb7oz(2.92kg)  SVD      Comments: Gestational Diabetes @ 17 weeks; followed at High Risk   2 TRM 6/09 [redacted]w[redacted]d  7lb(3.175kg)  SVD      Comments: Gestational Diabetes @ ~18 weeks; Followed at High Risk   3 CUR               Past Surgical History: Past Surgical History  Procedure Date  . No past surgeries     Family History: Family History  Problem Relation Age of Onset  . Breast cancer Paternal Grandmother   . Colon cancer Paternal Grandfather   . Colon cancer Maternal Grandfather   . Colon polyps Maternal Aunt   . Diabetes Mother     maternal aunts and uncles  . Diabetes Sister   . Diabetes Father   . Heart disease Father     Social History: History  Substance Use Topics  . Smoking status: Current Every Day Smoker -- 0.5 packs/day    Types: Cigarettes  . Smokeless tobacco: Never Used     Comment: decreased smoking   . Alcohol  Use: No     Comment: occasional    Allergies:  Allergies  Allergen Reactions  . Codeine Anaphylaxis and Hives    Meds:  Prescriptions prior to admission  Medication Sig Dispense Refill  . albuterol (PROVENTIL HFA;VENTOLIN HFA) 108 (90 BASE) MCG/ACT inhaler Inhale 2 puffs into the lungs every 4 (four) hours as needed for wheezing.  1 Inhaler  0  . nitrofurantoin, macrocrystal-monohydrate, (MACROBID) 100 MG capsule Take 1 capsule (100 mg total) by mouth daily.  30 capsule  5  . ondansetron (ZOFRAN) 4 MG tablet Take 1 tablet (4 mg total) by mouth every 6 (six) hours.  12 tablet  0  . Prenatal Vit-Fe Fumarate-FA (PRENATAL MULTIVITAMIN) TABS Take 1 tablet by mouth at bedtime.       . traMADol (ULTRAM) 50 MG tablet Take 50 mg by mouth every 6 (six) hours as needed. For pain        ROS: Pertinent findings in history of present illness.  Physical Exam  Blood pressure 146/97, pulse 97, temperature 97.4 F (36.3 C), temperature source Oral, resp. rate 16, height 5' 2.5" (1.588 m), weight  79.742 kg (175 lb 12.8 oz), last menstrual period 10/23/2011. GENERAL: Well-developed, well-nourished female in no acute distress.  HEENT: normocephalic HEART: normal rate RESP: normal effort ABDOMEN: Soft, non-tender, gravid appropriate for gestational age EXTREMITIES: Nontender, no edema NEURO: alert and oriented  Cervix 2/40/ballottable, vertex    FHT:  Baseline 145 ,  Minimal variability, No accelerations, prolonged decelerations lasting 3 minutes and 50 second decelerations that do not correlate with contractions Contractions: occasional, pt denies feeling ctx   Labs: Results for orders placed during the hospital encounter of 06/19/12 (from the past 24 hour(s))  PROTEIN / CREATININE RATIO, URINE     Status: Abnormal   Collection Time   06/19/12 10:49 AM      Component Value Range   Creatinine, Urine 188.8     Total Protein, Urine 621.6     PROTEIN CREATININE RATIO 3.29 (*) 0.00 - 0.15   URINALYSIS, ROUTINE W REFLEX MICROSCOPIC     Status: Abnormal   Collection Time   06/19/12 10:49 AM      Component Value Range   Color, Urine YELLOW  YELLOW   APPearance CLEAR  CLEAR   Specific Gravity, Urine 1.015  1.005 - 1.030   pH 6.5  5.0 - 8.0   Glucose, UA NEGATIVE  NEGATIVE mg/dL   Hgb urine dipstick TRACE (*) NEGATIVE   Bilirubin Urine NEGATIVE  NEGATIVE   Ketones, ur NEGATIVE  NEGATIVE mg/dL   Protein, ur >409 (*) NEGATIVE mg/dL   Urobilinogen, UA 0.2  0.0 - 1.0 mg/dL   Nitrite NEGATIVE  NEGATIVE   Leukocytes, UA NEGATIVE  NEGATIVE  URINE MICROSCOPIC-ADD ON     Status: Normal   Collection Time   06/19/12 10:49 AM      Component Value Range   Squamous Epithelial / LPF RARE  RARE   WBC, UA 0-2  <3 WBC/hpf  CBC     Status: Abnormal   Collection Time   06/19/12 10:55 AM      Component Value Range   WBC 16.4 (*) 4.0 - 10.5 K/uL   RBC 4.06  3.87 - 5.11 MIL/uL   Hemoglobin 13.3  12.0 - 15.0 g/dL   HCT 81.1  91.4 - 78.2 %   MCV 92.9  78.0 - 100.0 fL   MCH 32.8  26.0 - 34.0 pg   MCHC 35.3  30.0 - 36.0 g/dL   RDW 95.6  21.3 - 08.6 %   Platelets 115 (*) 150 - 400 K/uL  COMPREHENSIVE METABOLIC PANEL     Status: Abnormal   Collection Time   06/19/12 10:55 AM      Component Value Range   Sodium 137  135 - 145 mEq/L   Potassium 4.1  3.5 - 5.1 mEq/L   Chloride 103  96 - 112 mEq/L   CO2 20  19 - 32 mEq/L   Glucose, Bld 114 (*) 70 - 99 mg/dL   BUN 20  6 - 23 mg/dL   Creatinine, Ser 5.78  0.50 - 1.10 mg/dL   Calcium 9.6  8.4 - 46.9 mg/dL   Total Protein 5.6 (*) 6.0 - 8.3 g/dL   Albumin 2.2 (*) 3.5 - 5.2 g/dL   AST 18  0 - 37 U/L   ALT 8  0 - 35 U/L   Alkaline Phosphatase 120 (*) 39 - 117 U/L   Total Bilirubin 0.3  0.3 - 1.2 mg/dL   GFR calc non Af Amer 88 (*) >90 mL/min   GFR calc Af  Amer >90  >90 mL/min     Assessment: 1. Supervision of normal pregnancy   2. Preeclampsia   3. Non-reassuring electronic fetal monitoring tracing     Plan: Called Dr Jolayne Panther to  review EFM and labs Admit to antepartum for 24 hour urine Continuous EFM Betamethasone x2 doses in 24 hours  Sharen Counter Certified Nurse-Midwife 06/19/2012 1:53 PM

## 2012-06-19 NOTE — Patient Instructions (Addendum)
Please go to MAU due to the swelling and new headache. STOP SMOKING

## 2012-06-19 NOTE — MAU Note (Signed)
Onset of headache and swelling for one week, headache started yesterday has not gone away.

## 2012-06-19 NOTE — Progress Notes (Signed)
Reports new onset swelling and 1 day history of new frontal headaches.  Has history of cervicogenic headaches.  This is slightly different. Reflexes 2+/4 on exam, Swelling 2+/4 in LE and UE Continues to smoke Feeling baby move today, noticed less movement over the weekend (~ full day without movement) Taking macrobid daily for recoccurant UTI, urine today pending. Will send to MAU for pre-eclampsia work up and NST given new swelling and new type headache.  BP has been elevated but not above 130 previously.  Discussed with Dr. Leveda Anna and Dr. Jolayne Panther Also seems to bo have fallen off growth with only 1cm change in last 3 weeks.  Will need to continue to monitor growth curve. F/u in 1-2 weeks

## 2012-06-19 NOTE — Progress Notes (Signed)
Called Dr. Jolayne Panther to update on status of EFM strip with late decels.  MD viewed strip and new orders rec;d at this time.  O2 continues at 8-10L/min via facemask and pt is sleeping on left side.  IVF's at 125cc/hr.

## 2012-06-19 NOTE — Assessment & Plan Note (Signed)
UA today

## 2012-06-19 NOTE — Progress Notes (Signed)
Patient ID: Michelle [name], female   DOB: [birth year], 21 y.o.   MRN: [id number] Came to talk to patient regarding management plan. Patient is doing well without any complaints. She denies any headaches, visual disturbances, RUQ/epigastric pain.  Filed Vitals:   06/19/12 2206  BP: 144/100  Pulse: 77  Temp:   Resp:    Abd: S/Gravid/NT Ext: NT, equal in size, b/l lower extremity edema  FHT: baseline 150, min variability, no accels, occasional late decels Toco: occasional contractions 12/4 BPP 8/8  A/P 21 yo G3P2 at [redacted]w[redacted]d admitted to rule out preeclampsia - Fetal tracing category II- Discussed with patient concern regarding fetal tracing and possibility of delivery within the next 24 hours - NICU consult - Continue labetalol for BP control - Continue close monitoring - Plan to receive second dose of betamethasone at 3 pm  If not delivered - Awaiting 24 hr urine collection

## 2012-06-19 NOTE — H&P (Signed)
Michelle [name] is a 21 y.o. female presenting from family practice office for h/a, elevated BP, and edema.  She reports reduced fetal movement, denies regular contractions, LOF, vaginal bleeding, vaginal itching/burning, urinary symptoms, dizziness, n/v, or fever/chills.     Maternal Medical History:  Reason for admission: Reason for Admission:   [facility], [facility], [facility]/a  Contractions: Frequency: rare.   Perceived severity is mild.    Fetal activity: Perceived fetal activity is normal.   Last perceived fetal movement was within the past hour.    Prenatal complications: no prenatal complications Prenatal Complications - Diabetes: none.    OB History    Grav Para Term Preterm Abortions TAB SAB Ect Mult Living   3 2 2       2      Past Medical History  Diagnosis Date  . Tobacco abuse   . Bipolar I disorder   . PTSD (post-traumatic stress disorder)   . Post partum depression   . Menorrhagia   . 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 Gestational diabetes   . Pregnant   . Chronic headaches   . Bipolar disorder    Past Surgical History  Procedure Date  . No past surgeries    Family History: family history includes Breast cancer in her paternal grandmother; Colon cancer in her maternal grandfather and paternal grandfather; Colon polyps in her maternal aunt; Diabetes in her father, mother, and sister; and Heart disease in her father. Social History:  reports that she has been smoking Cigarettes.  She has been smoking about .5 packs per day. She has never used smokeless tobacco. She reports that she does not drink alcohol or use illicit drugs.   Prenatal Transfer Tool  Maternal Diabetes: No Genetic Screening: Declined Maternal Ultrasounds/Referrals: Abnormal:  Findings:   Other: low-lying placenta--resolved Fetal Ultrasounds or other Referrals:  None Maternal Substance Abuse:  No Significant Maternal Medications:  None Significant  Maternal Lab Results:  Lab values include: Other: GBS unknown Other Comments:  None  Review of Systems  Constitutional: Negative for fever, chills and malaise/fatigue.  Eyes: Negative for blurred vision.  Respiratory: Negative for cough and shortness of breath.   Cardiovascular: Negative for chest pain.  Gastrointestinal: Negative for heartburn, nausea, vomiting and abdominal pain.  Genitourinary: Negative for dysuria, urgency and frequency.  Musculoskeletal: Negative.   Neurological: Negative for dizziness and headaches.  Psychiatric/Behavioral: Negative for depression.      Blood pressure 146/97, pulse 97, temperature 97.4 F (36.3 C), temperature source Oral, resp. rate 16, height 5' 2.5" (1.588 m), weight 79.742 kg (175 lb 12.8 oz), last menstrual period 10/23/2011. Maternal Exam:  Uterine Assessment: Contraction strength is mild.  Contraction duration is 50 seconds. Contraction frequency is rare.   Abdomen: Patient reports no abdominal tenderness. Fetal presentation: vertex  Cervix: Cervix evaluated by digital exam.     Fetal Exam Fetal Monitor Review: Mode: ultrasound.   Baseline rate: 150.  Variability: minimal (<5 bpm).   Pattern: no accelerations and prolonged decelerations.   Prolonged decels lasting 3 minutes and decelerations down to 130s lasting 50-60 seconds that do not correlate with contractions.  After admission to ante, occasional lates noted.   Fetal State Assessment: Category II - tracings are indeterminate.     Physical Exam  Nursing note and vitals reviewed. Constitutional: She is oriented to person, place, and time. She appears well-developed and well-nourished.  Neck: Normal range of motion.  Cardiovascular: Normal rate and regular rhythm.   Respiratory: Effort normal and breath sounds normal.  GI: Soft.  Genitourinary:       2/40/ballotable, vertex  Musculoskeletal: Normal range of motion.  Neurological: She is alert and oriented to person,  place, and time.  Skin: Skin is warm and dry.  Psychiatric: She has a normal mood and affect. Her behavior is normal. Judgment and thought content normal.    Prenatal labs: ABO, Rh: O/POS/-- (06/25 1045) Antibody: NEG (06/25 1045) Rubella: 9.1 (06/25 1045) RPR: NON REAC (10/31 0943)  HBsAg: NEGATIVE (06/25 1045)  HIV: NON REACTIVE (10/31 0943)  GBS:   unknown  3 hour GTT on 05/22/12: 73, 177, 122, 87   Assessment/Plan: Preeclampsia Nonreassuring fetal surveillance  Admit to antepartum for 24 hour urine Continuous EFM Betamethasone x2 doses in 24 hours   LEFTW[place]-KIRBY, Teoman Giraud 06/19/2012, 2:02 PM

## 2012-06-20 ENCOUNTER — Encounter (HOSPITAL_COMMUNITY): Payer: Self-pay | Admitting: Radiology

## 2012-06-20 ENCOUNTER — Inpatient Hospital Stay (HOSPITAL_COMMUNITY): Payer: Medicaid Other

## 2012-06-20 ENCOUNTER — Encounter (HOSPITAL_COMMUNITY): Payer: Self-pay | Admitting: Anesthesiology

## 2012-06-20 ENCOUNTER — Encounter (HOSPITAL_COMMUNITY)
Admission: AD | Disposition: A | Discharge status: Home / Self Care | Payer: Self-pay | Source: Ambulatory Visit | Attending: Obstetrics and Gynecology

## 2012-06-20 ENCOUNTER — Inpatient Hospital Stay (HOSPITAL_COMMUNITY): Payer: Medicaid Other | Admitting: Anesthesiology

## 2012-06-20 DIAGNOSIS — Z87898 Personal history of other specified conditions: Secondary | ICD-10-CM

## 2012-06-20 DIAGNOSIS — IMO0002 Reserved for concepts with insufficient information to code with codable children: Secondary | ICD-10-CM

## 2012-06-20 HISTORY — DX: Personal history of other specified conditions: Z87.898

## 2012-06-20 LAB — POCT FERN TEST: POCT Fern Test: NEGATIVE

## 2012-06-20 LAB — CBC
HCT: 35.7 % — ABNORMAL LOW (ref 36.0–46.0)
Hemoglobin: 12.4 g/dL (ref 12.0–15.0)
MCH: 32.1 pg (ref 26.0–34.0)
MCHC: 34.7 g/dL (ref 30.0–36.0)
MCV: 92.5 fL (ref 78.0–100.0)
Platelets: 100 K/uL — ABNORMAL LOW (ref 150–400)
RBC: 3.86 MIL/uL — ABNORMAL LOW (ref 3.87–5.11)
RDW: 12.7 % (ref 11.5–15.5)
WBC: 18.2 K/uL — ABNORMAL HIGH (ref 4.0–10.5)

## 2012-06-20 LAB — GLUCOSE, CAPILLARY: Glucose-Capillary: 129 mg/dL — ABNORMAL HIGH (ref 70–99)

## 2012-06-20 SURGERY — Surgical Case
Anesthesia: Regional

## 2012-06-20 MED ORDER — CEFAZOLIN SODIUM-DEXTROSE 2-3 GM-% IV SOLR
2.0000 g | INTRAVENOUS | Status: AC
  Administered 2012-06-20: 2 g via INTRAVENOUS
  Filled 2012-06-20: qty 50

## 2012-06-20 MED ORDER — DIPHENHYDRAMINE HCL 50 MG/ML IJ SOLN: 12.5000 mg | INTRAMUSCULAR | Status: DC | PRN

## 2012-06-20 MED ORDER — SIMETHICONE 80 MG PO CHEW
80.0000 mg | CHEWABLE_TABLET | ORAL | Status: DC | PRN
  Administered 2012-06-22: 80 mg via ORAL

## 2012-06-20 MED ORDER — OXYTOCIN 10 UNIT/ML IJ SOLN
40.0000 [IU] | INTRAVENOUS | Status: DC | PRN
  Administered 2012-06-20: 40 [IU] via INTRAVENOUS

## 2012-06-20 MED ORDER — PRENATAL MULTIVITAMIN CH
1.0000 | ORAL_TABLET | Freq: Every day | ORAL | Status: DC
  Administered 2012-06-21 – 2012-06-23 (×3): 1 via ORAL
  Filled 2012-06-20 (×3): qty 1

## 2012-06-20 MED ORDER — CITRIC ACID-SODIUM CITRATE 334-500 MG/5ML PO SOLN
ORAL | Status: AC
  Filled 2012-06-20: qty 15

## 2012-06-20 MED ORDER — SENNOSIDES-DOCUSATE SODIUM 8.6-50 MG PO TABS
2.0000 | ORAL_TABLET | Freq: Every day | ORAL | Status: DC
  Administered 2012-06-20 – 2012-06-22 (×3): 2 via ORAL

## 2012-06-20 MED ORDER — SCOPOLAMINE 1 MG/3DAYS TD PT72
1.0000 | MEDICATED_PATCH | Freq: Once | TRANSDERMAL | Status: AC
  Administered 2012-06-20: 1.5 mg via TRANSDERMAL

## 2012-06-20 MED ORDER — MIDAZOLAM HCL 5 MG/5ML IJ SOLN
INTRAMUSCULAR | Status: DC | PRN
  Administered 2012-06-20: 4 mg via INTRAVENOUS

## 2012-06-20 MED ORDER — PNEUMOCOCCAL VAC POLYVALENT 25 MCG/0.5ML IJ INJ
0.5000 mL | INJECTION | INTRAMUSCULAR | Status: AC
  Administered 2012-06-21: 0.5 mL via INTRAMUSCULAR
  Filled 2012-06-20: qty 0.5

## 2012-06-20 MED ORDER — DIPHENHYDRAMINE HCL 25 MG PO CAPS: 25.0000 mg | ORAL_CAPSULE | ORAL | Status: DC | PRN

## 2012-06-20 MED ORDER — OXYCODONE-ACETAMINOPHEN 5-325 MG PO TABS
1.0000 | ORAL_TABLET | ORAL | Status: DC | PRN
  Administered 2012-06-20 – 2012-06-21 (×3): 2 via ORAL
  Administered 2012-06-21: 1 via ORAL
  Administered 2012-06-21: 2 via ORAL
  Administered 2012-06-21 (×3): 1 via ORAL
  Administered 2012-06-22 – 2012-06-23 (×6): 2 via ORAL
  Filled 2012-06-20 (×4): qty 2
  Filled 2012-06-20: qty 1
  Filled 2012-06-20: qty 2
  Filled 2012-06-20: qty 1
  Filled 2012-06-20 (×2): qty 2
  Filled 2012-06-20: qty 1
  Filled 2012-06-20 (×3): qty 2
  Filled 2012-06-20: qty 1
  Filled 2012-06-20: qty 2

## 2012-06-20 MED ORDER — MORPHINE SULFATE 0.5 MG/ML IJ SOLN
INTRAMUSCULAR | Status: AC
  Filled 2012-06-20: qty 10

## 2012-06-20 MED ORDER — OXYTOCIN 40 UNITS IN LACTATED RINGERS INFUSION - SIMPLE MED: 62.5000 mL/h | INTRAVENOUS | Status: AC

## 2012-06-20 MED ORDER — NALOXONE HCL 0.4 MG/ML IJ SOLN: 0.4000 mg | INTRAMUSCULAR | Status: DC | PRN

## 2012-06-20 MED ORDER — ONDANSETRON HCL 4 MG PO TABS: 4.0000 mg | ORAL_TABLET | ORAL | Status: DC | PRN

## 2012-06-20 MED ORDER — NICOTINE 21 MG/24HR TD PT24
21.0000 mg | MEDICATED_PATCH | Freq: Every day | TRANSDERMAL | Status: DC
  Filled 2012-06-20 (×5): qty 1

## 2012-06-20 MED ORDER — SCOPOLAMINE 1 MG/3DAYS TD PT72
MEDICATED_PATCH | TRANSDERMAL | Status: AC
  Administered 2012-06-20: 1.5 mg via TRANSDERMAL
  Filled 2012-06-20: qty 1

## 2012-06-20 MED ORDER — METOCLOPRAMIDE HCL 5 MG/ML IJ SOLN: 10.0000 mg | Freq: Three times a day (TID) | INTRAMUSCULAR | Status: DC | PRN

## 2012-06-20 MED ORDER — MIDAZOLAM HCL 2 MG/2ML IJ SOLN
INTRAMUSCULAR | Status: AC
  Filled 2012-06-20: qty 4

## 2012-06-20 MED ORDER — LACTATED RINGERS IV SOLN
INTRAVENOUS | Status: DC
  Administered 2012-06-21: via INTRAVENOUS

## 2012-06-20 MED ORDER — ONDANSETRON HCL 4 MG/2ML IJ SOLN
INTRAMUSCULAR | Status: DC | PRN
  Administered 2012-06-20: 4 mg via INTRAVENOUS

## 2012-06-20 MED ORDER — MAGNESIUM SULFATE 50 % IJ SOLN
4.0000 g | INTRAVENOUS | Status: DC | PRN
  Administered 2012-06-20: 4 g via INTRAVENOUS
  Administered 2012-06-20: 10:00:00 via INTRAVENOUS

## 2012-06-20 MED ORDER — DIPHENHYDRAMINE HCL 25 MG PO CAPS: 25.0000 mg | ORAL_CAPSULE | Freq: Four times a day (QID) | ORAL | Status: DC | PRN

## 2012-06-20 MED ORDER — ONDANSETRON HCL 4 MG/2ML IJ SOLN: 4.0000 mg | Freq: Three times a day (TID) | INTRAMUSCULAR | Status: DC | PRN

## 2012-06-20 MED ORDER — ONDANSETRON HCL 4 MG/2ML IJ SOLN: 4.0000 mg | INTRAMUSCULAR | Status: DC | PRN

## 2012-06-20 MED ORDER — BUPIVACAINE HCL (PF) 0.5 % IJ SOLN
INTRAMUSCULAR | Status: AC
  Filled 2012-06-20: qty 30

## 2012-06-20 MED ORDER — IBUPROFEN 600 MG PO TABS: 600.0000 mg | ORAL_TABLET | Freq: Four times a day (QID) | ORAL | Status: DC | PRN

## 2012-06-20 MED ORDER — DIPHENHYDRAMINE HCL 50 MG/ML IJ SOLN: 25.0000 mg | INTRAMUSCULAR | Status: DC | PRN

## 2012-06-20 MED ORDER — TETANUS-DIPHTH-ACELL PERTUSSIS 5-2.5-18.5 LF-MCG/0.5 IM SUSP
0.5000 mL | Freq: Once | INTRAMUSCULAR | Status: DC
  Filled 2012-06-20: qty 0.5

## 2012-06-20 MED ORDER — SODIUM CHLORIDE 0.9 % IJ SOLN: 3.0000 mL | INTRAMUSCULAR | Status: DC | PRN

## 2012-06-20 MED ORDER — MAGNESIUM SULFATE 50 % IJ SOLN: 4.0000 g | INTRAVENOUS | Status: DC | PRN

## 2012-06-20 MED ORDER — PHENYLEPHRINE HCL 10 MG/ML IJ SOLN
INTRAMUSCULAR | Status: DC | PRN
  Administered 2012-06-20: 80 ug via INTRAVENOUS

## 2012-06-20 MED ORDER — DIBUCAINE 1 % RE OINT: 1.0000 "application " | TOPICAL_OINTMENT | RECTAL | Status: DC | PRN

## 2012-06-20 MED ORDER — MENTHOL 3 MG MT LOZG: 1.0000 | LOZENGE | OROMUCOSAL | Status: DC | PRN

## 2012-06-20 MED ORDER — IBUPROFEN 600 MG PO TABS
600.0000 mg | ORAL_TABLET | Freq: Four times a day (QID) | ORAL | Status: DC
  Administered 2012-06-20 – 2012-06-22 (×4): 600 mg via ORAL
  Filled 2012-06-20 (×4): qty 1

## 2012-06-20 MED ORDER — MEPERIDINE HCL 25 MG/ML IJ SOLN: 6.2500 mg | INTRAMUSCULAR | Status: DC | PRN

## 2012-06-20 MED ORDER — OXYTOCIN 10 UNIT/ML IJ SOLN
INTRAMUSCULAR | Status: AC
  Filled 2012-06-20: qty 4

## 2012-06-20 MED ORDER — MAGNESIUM SULFATE 40 G IN LACTATED RINGERS - SIMPLE
2.0000 g/h | INTRAVENOUS | Status: DC
  Administered 2012-06-21: 2 g/h via INTRAVENOUS
  Filled 2012-06-20 (×2): qty 500

## 2012-06-20 MED ORDER — SIMETHICONE 80 MG PO CHEW
80.0000 mg | CHEWABLE_TABLET | Freq: Three times a day (TID) | ORAL | Status: DC
  Administered 2012-06-20 – 2012-06-22 (×8): 80 mg via ORAL

## 2012-06-20 MED ORDER — CEFAZOLIN SODIUM-DEXTROSE 2-3 GM-% IV SOLR
INTRAVENOUS | Status: AC
  Filled 2012-06-20: qty 50

## 2012-06-20 MED ORDER — LACTATED RINGERS IV SOLN
INTRAVENOUS | Status: DC | PRN
  Administered 2012-06-20: 10:00:00 via INTRAVENOUS

## 2012-06-20 MED ORDER — KETOROLAC TROMETHAMINE 30 MG/ML IJ SOLN: 30.0000 mg | Freq: Four times a day (QID) | INTRAMUSCULAR | Status: DC | PRN

## 2012-06-20 MED ORDER — NALBUPHINE SYRINGE 5 MG/0.5 ML
INJECTION | INTRAMUSCULAR | Status: AC
  Administered 2012-06-20: 5 mg via SUBCUTANEOUS
  Filled 2012-06-20: qty 0.5

## 2012-06-20 MED ORDER — NALBUPHINE HCL 10 MG/ML IJ SOLN
5.0000 mg | INTRAMUSCULAR | Status: DC | PRN
  Filled 2012-06-20: qty 1

## 2012-06-20 MED ORDER — MAGNESIUM SULFATE BOLUS VIA INFUSION: 4.0000 g | Freq: Once | INTRAVENOUS | Status: DC

## 2012-06-20 MED ORDER — MAGNESIUM SULFATE 40 G IN LACTATED RINGERS - SIMPLE: 2.0000 g/h | INTRAVENOUS | Status: DC

## 2012-06-20 MED ORDER — LANOLIN HYDROUS EX OINT: 1.0000 "application " | TOPICAL_OINTMENT | CUTANEOUS | Status: DC | PRN

## 2012-06-20 MED ORDER — HYDROMORPHONE HCL PF 1 MG/ML IJ SOLN
INTRAMUSCULAR | Status: AC
  Administered 2012-06-20: 0.5 mg via INTRAVENOUS
  Filled 2012-06-20: qty 1

## 2012-06-20 MED ORDER — BUPIVACAINE HCL (PF) 0.5 % IJ SOLN
INTRAMUSCULAR | Status: DC | PRN
  Administered 2012-06-20: 30 mL

## 2012-06-20 MED ORDER — ZOLPIDEM TARTRATE 5 MG PO TABS
5.0000 mg | ORAL_TABLET | Freq: Every evening | ORAL | Status: DC | PRN
  Administered 2012-06-21 – 2012-06-22 (×2): 5 mg via ORAL
  Filled 2012-06-20 (×2): qty 1

## 2012-06-20 MED ORDER — NALBUPHINE HCL 10 MG/ML IJ SOLN
5.0000 mg | INTRAMUSCULAR | Status: DC | PRN
  Administered 2012-06-20: 5 mg via SUBCUTANEOUS
  Filled 2012-06-20: qty 1

## 2012-06-20 MED ORDER — ONDANSETRON HCL 4 MG/2ML IJ SOLN
INTRAMUSCULAR | Status: AC
  Filled 2012-06-20: qty 2

## 2012-06-20 MED ORDER — HYDROMORPHONE HCL PF 1 MG/ML IJ SOLN
0.2500 mg | INTRAMUSCULAR | Status: DC | PRN
  Administered 2012-06-20 (×4): 0.5 mg via INTRAVENOUS

## 2012-06-20 MED ORDER — PHENYLEPHRINE 40 MCG/ML (10ML) SYRINGE FOR IV PUSH (FOR BLOOD PRESSURE SUPPORT)
PREFILLED_SYRINGE | INTRAVENOUS | Status: AC
  Filled 2012-06-20: qty 5

## 2012-06-20 MED ORDER — MAGNESIUM SULFATE BOLUS VIA INFUSION
2.0000 g | Freq: Once | INTRAVENOUS | Status: AC
  Filled 2012-06-20: qty 500

## 2012-06-20 MED ORDER — WITCH HAZEL-GLYCERIN EX PADS: 1.0000 "application " | MEDICATED_PAD | CUTANEOUS | Status: DC | PRN

## 2012-06-20 MED ORDER — BUPIVACAINE HCL (PF) 0.25 % IJ SOLN
INTRAMUSCULAR | Status: AC
  Filled 2012-06-20: qty 30

## 2012-06-20 MED ORDER — LACTATED RINGERS IV SOLN
INTRAVENOUS | Status: DC | PRN
  Administered 2012-06-20 (×2): via INTRAVENOUS

## 2012-06-20 MED ORDER — FENTANYL CITRATE 0.05 MG/ML IJ SOLN
INTRAMUSCULAR | Status: AC
  Filled 2012-06-20: qty 2

## 2012-06-20 MED ORDER — NALOXONE HCL 1 MG/ML IJ SOLN
1.0000 ug/kg/h | INTRAVENOUS | Status: DC | PRN
  Filled 2012-06-20: qty 2

## 2012-06-20 MED ORDER — KETOROLAC TROMETHAMINE 60 MG/2ML IM SOLN: 60.0000 mg | Freq: Once | INTRAMUSCULAR | Status: DC | PRN

## 2012-06-20 MED ORDER — FENTANYL CITRATE 0.05 MG/ML IJ SOLN
INTRAMUSCULAR | Status: DC | PRN
  Administered 2012-06-20: 25 ug via INTRATHECAL

## 2012-06-20 SURGICAL SUPPLY — 36 items
BARRIER ADHS 3X4 INTERCEED (GAUZE/BANDAGES/DRESSINGS) IMPLANT
CLOTH BEACON ORANGE TIMEOUT ST (SAFETY) ×2 IMPLANT
CONTAINER PREFILL 10% NBF 15ML (MISCELLANEOUS) IMPLANT
DRESSING TELFA 8X3 (GAUZE/BANDAGES/DRESSINGS) IMPLANT
DRSG COVADERM 4X10 (GAUZE/BANDAGES/DRESSINGS) IMPLANT
DRSG OPSITE POSTOP 4X10 (GAUZE/BANDAGES/DRESSINGS) ×2 IMPLANT
DURAPREP 26ML APPLICATOR (WOUND CARE) ×2 IMPLANT
ELECT REM PT RETURN 9FT ADLT (ELECTROSURGICAL) ×2
ELECTRODE REM PT RTRN 9FT ADLT (ELECTROSURGICAL) ×1 IMPLANT
GAUZE SPONGE 4X4 12PLY STRL LF (GAUZE/BANDAGES/DRESSINGS) IMPLANT
GLOVE BIO SURGEON STRL SZ 6.5 (GLOVE) ×4 IMPLANT
GOWN PREVENTION PLUS LG XLONG (DISPOSABLE) ×6 IMPLANT
KIT ABG SYR 3ML LUER SLIP (SYRINGE) IMPLANT
NEEDLE HYPO 25X5/8 SAFETYGLIDE (NEEDLE) IMPLANT
NEEDLE SPNL 18GX3.5 QUINCKE PK (NEEDLE) ×2 IMPLANT
NS IRRIG 1000ML POUR BTL (IV SOLUTION) ×2 IMPLANT
PACK C SECTION WH (CUSTOM PROCEDURE TRAY) ×2 IMPLANT
PAD ABD 7.5X8 STRL (GAUZE/BANDAGES/DRESSINGS) IMPLANT
PAD OB MATERNITY 4.3X12.25 (PERSONAL CARE ITEMS) IMPLANT
SLEEVE SCD COMPRESS KNEE MED (MISCELLANEOUS) IMPLANT
STRIP CLOSURE SKIN 1/2X4 (GAUZE/BANDAGES/DRESSINGS) ×2 IMPLANT
SUT PDS AB 0 CTX 60 (SUTURE) IMPLANT
SUT VIC AB 0 CT1 27 (SUTURE)
SUT VIC AB 0 CT1 27XBRD ANBCTR (SUTURE) IMPLANT
SUT VIC AB 0 CT1 36 (SUTURE) IMPLANT
SUT VIC AB 2-0 CT1 27 (SUTURE) ×1
SUT VIC AB 2-0 CT1 TAPERPNT 27 (SUTURE) ×1 IMPLANT
SUT VIC AB 2-0 CTX 36 (SUTURE) ×4 IMPLANT
SUT VIC AB 3-0 CT1 27 (SUTURE) ×1
SUT VIC AB 3-0 CT1 TAPERPNT 27 (SUTURE) ×1 IMPLANT
SUT VIC AB 3-0 SH 27 (SUTURE)
SUT VIC AB 3-0 SH 27X BRD (SUTURE) IMPLANT
SYR 30ML LL (SYRINGE) ×2 IMPLANT
TOWEL OR 17X24 6PK STRL BLUE (TOWEL DISPOSABLE) ×4 IMPLANT
TRAY FOLEY CATH 14FR (SET/KITS/TRAYS/PACK) ×2 IMPLANT
WATER STERILE IRR 1000ML POUR (IV SOLUTION) ×2 IMPLANT

## 2012-06-20 NOTE — Progress Notes (Signed)
Spoke with Dr. Jean Rosenthal and Dr. Marice Potter regarding having blood in the OR. They said they would call blood bank if they needed the blood and do emergency release for O negative. At this time the pt is a PPH LOW. T&S drawn and 2 units of PRBC ordered. Kendra RN in the OR also advised.

## 2012-06-20 NOTE — Progress Notes (Signed)
Patient ID: Michelle [name], female   DOB: [birth year], 21 y.o.   MRN: [id number] Attending on call note  After receiving check out on this patient, I spoke with MFM, Dr. Vincenza Hews, who reviewed the chart and FHR. U/S called soon after to tell me about the poor fetal growth. I have spoken with Ms. Michelle [name] and strongly recommended a cesarean section done ASAP. OR, NICU and anesthesia notified

## 2012-06-20 NOTE — Anesthesia Postprocedure Evaluation (Signed)
  Anesthesia Post-op Note  Patient: Michelle [name]  Procedure(s) Performed: Procedure(s) (LRB) with comments: CESAREAN SECTION (N/A)  Patient is awake and responsive. She will go to CT scanner. Pain and nausea are reasonably well controlled. Dr Marice Potter opts for PO meds vs PCA Vital signs are stable and clinically acceptable. Oxygen saturation is clinically acceptable. There are no apparent anesthetic complications at this time. Patient is ready for discharge.

## 2012-06-20 NOTE — Progress Notes (Signed)
  Family Medicine Resident Continuity Delivery Interim Follow-up Note   Patient name: Michelle [name] Medical record number: [id number] Date of birth: [birth year] Age: 21 y.o. Gender: female Length of Stay:  LOS: 1 day     Visited with pt this evening.  She reports generalized malaise but is resting well and denies headache, vision changes, nausea or vomiting.  There has been no further seizure activity.  There was CBG from 1111 today that was erroneously added to [name]'s Chart from [facility]).  Subsequent CBG was normal.  [name] has been able to eat and drink without difficulty and is wanting to travel to the NICU to see Eli.  Nursing will assess her ability to transfer and determine appropriateness for transport.  Eli is currently ventilated (s/p Jet Ventilator) and is having difficulties maintaining his oxygen levels as well as CBGs.  He remains critical and will be expected to remain under NICU's care for an additional 2-3 weeks pending clinical improvement.             DISPOSITION  [name] is to remain in AICU for 24 hours of Magnesium.  We will continue to monitor BPs and control as appropriate. I appreciate [facility]'s excellent care of this difficult and unfortunate case.    Andrena Mews, DO [facility] Resident - PGY-2 06/20/2012 5:33 PM

## 2012-06-20 NOTE — Anesthesia Postprocedure Evaluation (Signed)
  Anesthesia Post-op Note  Patient: Michelle [name]  Procedure(s) Performed: Procedure(s) (LRB) with comments: CESAREAN SECTION (N/A)  Patient Location: A-ICU  Anesthesia Type:Spinal  Level of Consciousness: awake  Airway and Oxygen Therapy: Patient Spontanous Breathing  Post-op Pain: mild  Post-op Assessment: Patient's Cardiovascular Status Stable and Respiratory Function Stable  Post-op Vital Signs: stable  Complications: No apparent anesthesia complications

## 2012-06-20 NOTE — Transfer of Care (Signed)
Immediate Anesthesia Transfer of Care Note  Patient: Michelle [name]  Procedure(s) Performed: Procedure(s) (LRB) with comments: CESAREAN SECTION (N/A)  Patient Location: PACU  Anesthesia Type:Spinal  Level of Consciousness: sedated  Airway & Oxygen Therapy: Patient Spontanous Breathing and Patient connected to nasal cannula oxygen  Post-op Assessment: Report given to PACU RN and Post -op Vital signs reviewed and stable  Post vital signs: stable  Complications: No apparent anesthesia complications

## 2012-06-20 NOTE — Addendum Note (Signed)
Addendum  created 06/20/12 1643 by Renford Dills, CRNA   Modules edited:Notes Section

## 2012-06-20 NOTE — Anesthesia Procedure Notes (Signed)
Spinal  Patient location during procedure: OR Preanesthetic Checklist Completed: patient identified, site marked, surgical consent, pre-op evaluation, timeout performed, IV checked, risks and benefits discussed and monitors and equipment checked Spinal Block Patient position: sitting Prep: DuraPrep Patient monitoring: heart rate, cardiac monitor, continuous pulse ox and blood pressure Approach: midline Location: L3-4 Injection technique: single-shot Needle Needle type: Sprotte  Needle gauge: 24 G Needle length: 9 cm Assessment Sensory level: T4 Additional Notes Spinal Dosage in OR  Bupivicaine ml       1.3 Fentanyl mcg            25

## 2012-06-20 NOTE — Progress Notes (Signed)
  Family Medicine Resident Continuity Delivery   Patient name: Michelle [name] Medical record number: [id number] Date of birth: [birth year] Age: 21 y.o. Gender: female Length of Stay:  LOS: 1 day     Pt admitted 12/4 for Pre-ecclampsia.  This morning category III tracing, poor growth scan leading to primary C-Section.  I was present and assisted in C-section.  During surgery pt was noted to have Generalized Tonic Clonic Seizures.  Responded to versed.  Please see Operative Note and Anesthesia notes for further details.            DISPOSITION  To AICU for 24o magnesium.  Disposition pending further clinical improvement.   Baby to NICU care.  Andrena Mews, DO [facility] Resident - PGY-2 06/20/2012 10:16 AM

## 2012-06-20 NOTE — Op Note (Addendum)
06/19/2012 - 06/20/2012  10:07 AM  PATIENT:  Michelle [name]  21 y.o. female  PRE-OPERATIVE DIAGNOSIS:  Non-Reassuring Fetal Heart Rate; pre eclampsia  POST-OPERATIVE DIAGNOSIS:  Non-Reassuring Fetal Heart Rate; eclampsia  PROCEDURE:  Procedure(s) (LRB) with comments: CESAREAN SECTION (N/A)  SURGEON:  Surgeon(s) and Role:    * Allie Bossier, MD - Primary  PHYSICIAN ASSISTANT:   ASSISTANTS: Gaspar Bidding, DO   ANESTHESIA:   spinal  EBL:  Total I/O In: 1000 [I.V.:1000] Out: 550 [Urine:50; Blood:500]  BLOOD ADMINISTERED:none  DRAINS: none   COMPLICATIONS: intraoperative eclamptic seizure  FINDINGS: Living female infant with Apgars 6 &8, weight 3-6   intact, small, calcified placenta, normal adnexa Cord gas 6.99  LOCAL MEDICATIONS USED:  MARCAINE     SPECIMEN:  Source of Specimen:  cord blood, cord gas, placenta  DISPOSITION OF SPECIMEN:  PATHOLOGY  COUNTS:  YES  TOURNIQUET:  * No tourniquets in log *  DICTATION: .Dragon Dictation  PLAN OF CARE: Admit to inpatient   PATIENT DISPOSITION:  PACU - hemodynamically stable.   Delay start of Pharmacological VTE agent (>24hrs) due to surgical blood loss or risk of bleeding: yes  The risks, benefits, and alternatives of surgery were explained, understood, accepted. Consents were signed. All questions were answered. In the operating room spinal anesthesia was applied without complication. Her abdomen and vagina were prepped and draped in the usual sterile fashion. A Foley catheter was placed, draining clear urine throughout case. Timeout procedure was done. After adequate anesthesia was assured, a low transverse incision was made. The incision was carried down through the subcutaneous tissue to the fascia. The fascia was scored the midline and extended bilaterally. The middle 50% of the rectus muscles were separated in a transverse fashion using electrosurgical technique. Excellent hemostasis was maintained. The peritoneum was  entered with hemostats. Peritoneal incision was extended bilaterally with the Bovie. The bladder blade was placed. A transverse incision was made on the lower uterine segment. The uterine incision was extended with traction on each side. Amniotomy was performed with a hemostat. Clear fluid was noted. The baby was delivered from a vertex presentation. The baby's cord was clamped and cut and it was transferred to the NICU personnel for care. The placenta was delivered intact with traction after a section of cord was removed for a cord gas. The uterus was left in situ and the interior was cleaned with a dry lap sponge. The uterine incision was closed with two layers of2-0 Vicryl running locking suture, the second layer imbricating the first. Excellent hemostasis was noted. At about this time she began having a tonic clonic seizure. Magnesium sulfate and versed were given and the seizure stopped. By tilting the uterus each side was able to visualize the adnexa, and they were normal. The rectus fascia rectus muscles were noted be hemostatic as well. The fascia was closed with a #0 vicryl suture inn a running nonlocking fashion. No defects were palpable. The subcutaneous tissue was irrigated, clean, and dried. I then injected 25 mL of 0.5% marcaine into the subcutaneous tissue. A subcuticular closure was done with a 3-0 Vicryl suture. Steri-Strips are placed. Excellent cosmetic results were obtained. She was taken to the recovery room in stable condition.

## 2012-06-20 NOTE — Progress Notes (Signed)
MFM- retrospective note  Dr. Marice Potter came to my office around 8:30am for a curbside consult on this patient. I was told she had elevated blood pressures, significant proteinuria, and a concerning fetal tracing. Was told patient had a BPP of 8/8 yesterday.  I quickly evaluated the patient's chart and noted a few severe range blood pressure and platelets had been 115,000 on admission. I pulled up the fetal tracing and there was a normal baseline with minimal variability with repetitive late decelerations since yesterday afternoon.  Based on this quick review I recommended patient be started on magnesium sulfate for seizure prophylaxis and be delivered immediately by C section for nonreassuring fetal status. Also recommended repeat lab studies to evaluate for HELLP syndrome as platelet count previously had been decreased.  Dr. Marice Potter was in agreement and proceeded with C section.  Dr. Marice Potter returned to inform me that the patient had a seizure- I then recommended that the patient have a CT scan of the head to evaluate for bleeding.  If official consult is desired we will be happy to make further recommendations.  Eulis Foster, MD

## 2012-06-20 NOTE — Progress Notes (Addendum)
CBG at 11:11am listed as 26. Per NICU nurse at 17:00, baby arrived to NICU with newborn and maternal bracelets.  Nurse-to-nurse reports for patient made no mention of pt with CBG of 26.  CBG value possibly represents infant's glucose level at that time, but posted on mother's chart due to scanning error.  See infant's chart.

## 2012-06-20 NOTE — Anesthesia Preprocedure Evaluation (Signed)
Anesthesia Evaluation  Patient identified by MRN, date of birth, ID band Patient awake    Reviewed: Allergy & Precautions, H&P , Patient's Chart, lab work & pertinent test results  Airway Mallampati: II TM Distance: >3 FB Neck ROM: full    Dental No notable dental hx.    Pulmonary  breath sounds clear to auscultation  Pulmonary exam normal       Cardiovascular Exercise Tolerance: Good hypertension, Rhythm:regular Rate:Normal     Neuro/Psych    GI/Hepatic   Endo/Other    Renal/GU      Musculoskeletal   Abdominal   Peds  Hematology   Anesthesia Other Findings PIH, Low platlets. Repeated now. Swollen face and hands MP lll, No Hx of GA 18GA IV to L ACF.  Swollen difficult access  Reproductive/Obstetrics                           Anesthesia Physical Anesthesia Plan  ASA: III  Anesthesia Plan: Spinal   Post-op Pain Management:    Induction:   Airway Management Planned:   Additional Equipment:   Intra-op Plan:   Post-operative Plan:   Informed Consent: I have reviewed the patients History and Physical, chart, labs and discussed the procedure including the risks, benefits and alternatives for the proposed anesthesia with the patient or authorized representative who has indicated his/her understanding and acceptance.   Dental Advisory Given  Plan Discussed with: CRNA  Anesthesia Plan Comments: (Lab work confirmed with CRNA in room. Platelets okay. Discussed spinal anesthetic, and patient consents to the procedure:  included risk of possible headache,backache, failed block, allergic reaction, and nerve injury. This patient was asked if she had any questions or concerns before the procedure started. )        Anesthesia Quick Evaluation

## 2012-06-20 NOTE — Progress Notes (Signed)
Patient ID: Michelle [name], female   DOB: [birth year], 21 y.o.   MRN: [id number] FACULTY PRACTICE ANTEPARTUM(COMPREHENSIVE) NOTE  Michelle [name] is a 21 y.o. G3P2002 at [redacted]w[redacted]d who is admitted for rule out preeclampsia.   Fetal presentation is cephalic. Length of Stay:  1  Days  Subjective: Patient reports feeling well this morning. She denies any headaches, visual disturbances, RUQ/epigastric pain, nausea/emesis. Patient reports the fetal movement as active. Patient reports uterine contraction  activity as occasional. Patient reports  vaginal bleeding as none. Patient describes fluid per vagina as None.  Vitals:  Blood pressure 134/80, pulse 96, temperature 98.2 F (36.8 C), temperature source Oral, resp. rate 18, height 5' 2.5" (1.588 m), weight 79.742 kg (175 lb 12.8 oz), last menstrual period 10/23/2011, SpO2 96.00%. Physical Examination:  General appearance - alert, well appearing, and in no distress Fundal Height:  consistent with dates Pelvic Exam:  examination not indicated Cervical Exam: Not evaluated.  Extremities: Homans sign is negative, no sign of DVT with DTRs 2+ bilaterally Membranes:intact  Fetal Monitoring:  Baseline: 135 bpm, Variability: Fair (1-6 bpm), Accelerations: no accels, Decelerations: present and Toco: irregular contractions  Labs:  Recent Results (from the past 24 hour(s))  PROTEIN / CREATININE RATIO, URINE   Collection Time   06/19/12 10:49 AM      Component Value Range   Creatinine, Urine 188.8     Total Protein, Urine 621.6     PROTEIN CREATININE RATIO 3.29 (*) 0.00 - 0.15  URINALYSIS, ROUTINE W REFLEX MICROSCOPIC   Collection Time   06/19/12 10:49 AM      Component Value Range   Color, Urine YELLOW  YELLOW   APPearance CLEAR  CLEAR   Specific Gravity, Urine 1.015  1.005 - 1.030   pH 6.5  5.0 - 8.0   Glucose, UA NEGATIVE  NEGATIVE mg/dL   Hgb urine dipstick TRACE (*) NEGATIVE   Bilirubin Urine NEGATIVE  NEGATIVE   Ketones, ur NEGATIVE  NEGATIVE  mg/dL   Protein, ur >696 (*) NEGATIVE mg/dL   Urobilinogen, UA 0.2  0.0 - 1.0 mg/dL   Nitrite NEGATIVE  NEGATIVE   Leukocytes, UA NEGATIVE  NEGATIVE  URINE MICROSCOPIC-ADD ON   Collection Time   06/19/12 10:49 AM      Component Value Range   Squamous Epithelial / LPF RARE  RARE   WBC, UA 0-2  <3 WBC/hpf  CBC   Collection Time   06/19/12 10:55 AM      Component Value Range   WBC 16.4 (*) 4.0 - 10.5 K/uL   RBC 4.06  3.87 - 5.11 MIL/uL   Hemoglobin 13.3  12.0 - 15.0 g/dL   HCT 29.5  28.4 - 13.2 %   MCV 92.9  78.0 - 100.0 fL   MCH 32.8  26.0 - 34.0 pg   MCHC 35.3  30.0 - 36.0 g/dL   RDW 44.0  10.2 - 72.5 %   Platelets 115 (*) 150 - 400 K/uL  COMPREHENSIVE METABOLIC PANEL   Collection Time   06/19/12 10:55 AM      Component Value Range   Sodium 137  135 - 145 mEq/L   Potassium 4.1  3.5 - 5.1 mEq/L   Chloride 103  96 - 112 mEq/L   CO2 20  19 - 32 mEq/L   Glucose, Bld 114 (*) 70 - 99 mg/dL   BUN 20  6 - 23 mg/dL   Creatinine, Ser 3.66  0.50 - 1.10 mg/dL   Calcium  9.6  8.4 - 10.5 mg/dL   Total Protein 5.6 (*) 6.0 - 8.3 g/dL   Albumin 2.2 (*) 3.5 - 5.2 g/dL   AST 18  0 - 37 U/L   ALT 8  0 - 35 U/L   Alkaline Phosphatase 120 (*) 39 - 117 U/L   Total Bilirubin 0.3  0.3 - 1.2 mg/dL   GFR calc non Af Amer 88 (*) >90 mL/min   GFR calc Af Amer >90  >90 mL/min  POCT FERN TEST   Collection Time   06/20/12 12:12 AM      Component Value Range   POCT Fern Test Negative = intact amniotic membranes      Imaging Studies:    Currently EPIC will not allow sonographic studies to automatically populate into notes.  In the meantime, copy and paste results into note or free text.  Medications:  Scheduled    . betamethasone acetate-betamethasone sodium phosphate  12 mg Intramuscular Q24H  . docusate sodium  100 mg Oral Daily  . labetalol  200 mg Oral BID  . prenatal multivitamin  1 tablet Oral Daily  . [DISCONTINUED] docusate sodium  100 mg Oral Daily   I have reviewed the patient's  current medications.  ASSESSMENT: Patient Active Problem List  Diagnosis  . DSORD BIPOLAR I, UNSPC, MOST RECENT EPSD  . SMOKER  . PTSD  . PAP SMEAR, LGSIL, ABNORMAL  . Fibrocystic disease of breast  . Menorrhagia  . Acute abdominal pain in right lower quadrant  . Abdominal pain  . Syncope  . Nausea & vomiting  . Chest pain  . Vaginal bleeding before [redacted] weeks gestation  . Supervision of normal pregnancy  . OSTEOPATHIC FINDINDS - SOMATIC DYSFUNCTION  . Asymptomatic bacteriuria in pregnancy  . Preeclampsia    PLAN: 21 yo G3P2002 at [redacted]w[redacted]d admitted to rule out preeclampsia - FHT remains category II with BPP 8/8 yesterday afternoon - Continue close monitoring - BP better controlled on labetalol - Continue 24 hr urine collection - Second dose of betamethasone today  Michelle [name] 06/20/2012,6:57 AM

## 2012-06-21 ENCOUNTER — Encounter (HOSPITAL_COMMUNITY): Payer: Self-pay | Admitting: *Deleted

## 2012-06-21 LAB — CBC
HCT: 29.1 % — ABNORMAL LOW (ref 36.0–46.0)
Hemoglobin: 10 g/dL — ABNORMAL LOW (ref 12.0–15.0)
MCH: 32.4 pg (ref 26.0–34.0)
MCHC: 34.4 g/dL (ref 30.0–36.0)
MCV: 94.2 fL (ref 78.0–100.0)
Platelets: 84 10*3/uL — ABNORMAL LOW (ref 150–400)
RBC: 3.09 MIL/uL — ABNORMAL LOW (ref 3.87–5.11)
RDW: 12.9 % (ref 11.5–15.5)
WBC: 21.6 10*3/uL — ABNORMAL HIGH (ref 4.0–10.5)

## 2012-06-21 LAB — COMPREHENSIVE METABOLIC PANEL
ALT: 8 U/L (ref 0–35)
AST: 24 U/L (ref 0–37)
Albumin: 1.9 g/dL — ABNORMAL LOW (ref 3.5–5.2)
Alkaline Phosphatase: 92 U/L (ref 39–117)
BUN: 16 mg/dL (ref 6–23)
CO2: 20 mEq/L (ref 19–32)
Calcium: 7.6 mg/dL — ABNORMAL LOW (ref 8.4–10.5)
Chloride: 106 mEq/L (ref 96–112)
Creatinine, Ser: 0.89 mg/dL (ref 0.50–1.10)
GFR calc Af Amer: >90 mL/min (ref >90)
GFR calc non Af Amer: >90 mL/min (ref >90)
Glucose, Bld: 138 mg/dL — ABNORMAL HIGH (ref 70–99)
Potassium: 3.8 mEq/L (ref 3.5–5.1)
Sodium: 136 mEq/L (ref 135–145)
Total Bilirubin: 0.1 mg/dL — ABNORMAL LOW (ref 0.3–1.2)
Total Protein: 5 g/dL — ABNORMAL LOW (ref 6.0–8.3)

## 2012-06-21 LAB — GLUCOSE, CAPILLARY: Glucose-Capillary: 26 mg/dL — CL (ref 70–99)

## 2012-06-21 MED ORDER — MAGNESIUM SULFATE 40 G IN LACTATED RINGERS - SIMPLE
2.0000 g/h | INTRAVENOUS | Status: DC
  Filled 2012-06-21: qty 500
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
## 2012-06-21 NOTE — Progress Notes (Signed)
Subjective: Postpartum Day 1: Cesarean Delivery Patient reports tolerating PO.    Objective: Vital signs in last 24 hours: Temp:  [97.5 F (36.4 C)-98.2 F (36.8 C)] 98.2 F (36.8 C) (12/06 0346) Pulse Rate:  [72-109] 85  (12/06 0700) Resp:  [14-20] 18  (12/06 0632) BP: (96-142)/(45-91) 135/91 mmHg (12/06 0700) SpO2:  [96 %-100 %] 98 % (12/06 0700)  Physical Exam:  General: alert Lochia: appropriate Uterine Fundus: firm Incision: dressing c/d/i DVT Evaluation: No evidence of DVT seen on physical exam. DTR- 3+   Basename 06/21/12 0530 06/20/12 0859  HGB 10.0* 12.4  HCT 29.1* 35.7*   Platelets 84K  Assessment/Plan: Status post Cesarean section. Doing well postoperatively.  Continue current care I will order the magnesium to stop 24 hours post partum and transfer her to the floor.  Michelle [name] C. 06/21/2012, 7:40 AM

## 2012-06-21 NOTE — MAU Provider Note (Signed)
Attestation of Attending Supervision of Advanced Practitioner (CNM/NP): Evaluation and management procedures were performed by the Advanced Practitioner under my supervision and collaboration.  I have reviewed the Advanced Practitioner's note and chart, and I agree with the management and plan.  Damiel Barthold 06/21/2012 9:01 AM \ 

## 2012-06-21 NOTE — Progress Notes (Signed)
Pt. Very insistent on going downstairs to "get some fresh air" and then she was going to see her baby in NICU. I turned off the Magnesium drip at 1000 and her mom and FOB pushed her in the wheelchair downstairs.

## 2012-06-21 NOTE — H&P (Signed)
Attestation of Attending Supervision of Advanced Practitioner (CNM/NP): Evaluation and management procedures were performed by the Advanced Practitioner under my supervision and collaboration.  I have reviewed the Advanced Practitioner's note and chart, and I agree with the management and plan.  Margie Urbanowicz 06/21/2012 9:01 AM \ 

## 2012-06-22 LAB — CBC
HCT: 29.2 % — ABNORMAL LOW (ref 36.0–46.0)
HCT: 28.1 % — ABNORMAL LOW (ref 36.0–46.0)
Hemoglobin: 9.9 g/dL — ABNORMAL LOW (ref 12.0–15.0)
Hemoglobin: 9.7 g/dL — ABNORMAL LOW (ref 12.0–15.0)
MCH: 32.5 pg (ref 26.0–34.0)
MCH: 33.1 pg (ref 26.0–34.0)
MCHC: 33.9 g/dL (ref 30.0–36.0)
MCHC: 34.5 g/dL (ref 30.0–36.0)
MCV: 95.7 fL (ref 78.0–100.0)
MCV: 95.9 fL (ref 78.0–100.0)
Platelets: 65 10*3/uL — ABNORMAL LOW (ref 150–400)
Platelets: 68 10*3/uL — ABNORMAL LOW (ref 150–400)
RBC: 3.05 MIL/uL — ABNORMAL LOW (ref 3.87–5.11)
RBC: 2.93 MIL/uL — ABNORMAL LOW (ref 3.87–5.11)
RDW: 13.3 % (ref 11.5–15.5)
RDW: 13.2 % (ref 11.5–15.5)
WBC: 22.2 10*3/uL — ABNORMAL HIGH (ref 4.0–10.5)
WBC: 21.6 10*3/uL — ABNORMAL HIGH (ref 4.0–10.5)

## 2012-06-22 NOTE — Progress Notes (Addendum)
Subjective: Postpartum Day 2: Cesarean Delivery for NRFHT , with Eclamptic seizure during cesarean Patient reports vision abnormality in left eye, she's seeing dark spots in visual field, described as half the size of examiners face.  Objective: Vital signs in last 24 hours: Temp:  [97.4 F (36.3 C)-98.4 F (36.9 C)] 98.4 F (36.9 C) (12/07 0639) Pulse Rate:  [73-88] 74  (12/07 0639) Resp:  [16-20] 16  (12/07 0639) BP: (133-156)/(86-104) 146/104 mmHg (12/07 0639) SpO2:  [97 %-100 %] 98 % (12/07 0639) Weight:  [180 lb (81.647 kg)] 180 lb (81.647 kg) (12/07 0981)  Physical Exam:  General: alert, appears stated age and no distress HEENT: PERRL, EOMI, SCLERA NORMAL, VISUAL FIELDS NORMAL ON RIGHT, WITH APPARENT REDUCED VISION IN UPPER VISUAL FIELD, SHE DOES NOT SEE FINGER OR MOVEMENT UNTIL FINGER NEARLY TO CENTER OF FIELD. Cranial nerves otherwise intact Lochia: appropriate Uterine Fundus: firm Incision: healing well DVT Evaluation: No evidence of DVT seen on physical exam.   Basename 06/22/12 0510 06/21/12 0530  HGB 9.9* 10.0*  HCT 29.2* 29.1*   CBC    Component Value Date/Time   WBC 22.2* 06/22/2012 0510   RBC 3.05* 06/22/2012 0510   HGB 9.9* 06/22/2012 0510   HCT 29.2* 06/22/2012 0510   PLT 65* 06/22/2012 0510   MCV 95.7 06/22/2012 0510   MCH 32.5 06/22/2012 0510   MCHC 33.9 06/22/2012 0510   RDW 13.3 06/22/2012 0510   LYMPHSABS 3.0 01/21/2012 1049   MONOABS 0.8 01/21/2012 1049   EOSABS 0.1 01/21/2012 1049   BASOSABS 0.0 01/21/2012 1049     Assessment/Plan: Status post Cesarean section for NRFHT, . Postoperative course complicated by possible visual field change left eye,   Thrombocytopenia, leukocytosis s/p cesarean PLAN: Opthalmology consult,  Follow up cbc daily.  Tilda Burrow 06/22/2012, 7:39 AM   PHone consult with Dr Luciana Axe at [phone].  CT was ordered postop and was normal, reviewed with Dr Luciana Axe.  He advised followup MON/Tues/Wed at his office at above phone number  to check for retinal vein abnormalities.  No other interventions at this time.

## 2012-06-22 NOTE — Plan of Care (Signed)
Problem: Phase II Progression Outcomes Goal: Pain controlled on oral analgesia Outcome: Completed/Met Date Met:  06/22/12 Good pain control on po Percocet Goal: Progress activity as tolerated unless otherwise ordered Outcome: Completed/Met Date Met:  06/22/12 Walks frequently to NICU Goal: Afebrile, VS remain stable Outcome: Not Progressing BP continues to be elevated at times Goal: Incision intact & without signs/symptoms of infection Outcome: Completed/Met Date Met:  06/22/12 Patient has see through op site in place  Problem: Discharge Progression Outcomes Goal: Barriers To Progression Addressed/Resolved Outcome: Not Progressing Bp still fluctuating

## 2012-06-23 DIAGNOSIS — H5340 Unspecified visual field defects: Secondary | ICD-10-CM

## 2012-06-23 DIAGNOSIS — Z98891 History of uterine scar from previous surgery: Secondary | ICD-10-CM

## 2012-06-23 LAB — CBC WITH DIFFERENTIAL/PLATELET
Basophils Absolute: 0 10*3/uL (ref 0.0–0.1)
Basophils Relative: 0 % (ref 0–1)
Eosinophils Absolute: 0.3 10*3/uL (ref 0.0–0.7)
Eosinophils Relative: 1 % (ref 0–5)
HCT: 28.7 % — ABNORMAL LOW (ref 36.0–46.0)
Hemoglobin: 9.6 g/dL — ABNORMAL LOW (ref 12.0–15.0)
Lymphocytes Relative: 24 % (ref 12–46)
Lymphs Abs: 5.2 10*3/uL — ABNORMAL HIGH (ref 0.7–4.0)
MCH: 32.4 pg (ref 26.0–34.0)
MCHC: 33.4 g/dL (ref 30.0–36.0)
MCV: 97 fL (ref 78.0–100.0)
Monocytes Absolute: 1.6 10*3/uL — ABNORMAL HIGH (ref 0.1–1.0)
Monocytes Relative: 7 % (ref 3–12)
Neutro Abs: 14.7 10*3/uL — ABNORMAL HIGH (ref 1.7–7.7)
Neutrophils Relative %: 67 % (ref 43–77)
Platelets: 62 10*3/uL — ABNORMAL LOW (ref 150–400)
RBC: 2.96 MIL/uL — ABNORMAL LOW (ref 3.87–5.11)
RDW: 13.4 % (ref 11.5–15.5)
WBC: 21.8 10*3/uL — ABNORMAL HIGH (ref 4.0–10.5)

## 2012-06-23 MED ORDER — PNEUMOCOCCAL VAC POLYVALENT 25 MCG/0.5ML IJ INJ
0.5000 mL | INJECTION | Freq: Once | INTRAMUSCULAR | Status: AC
  Administered 2012-06-23: 0.5 mL via INTRAMUSCULAR
  Filled 2012-06-23: qty 0.5

## 2012-06-23 MED ORDER — IBUPROFEN 600 MG PO TABS: 600.0000 mg | ORAL_TABLET | Freq: Four times a day (QID) | ORAL | Status: DC

## 2012-06-23 MED ORDER — OXYCODONE-ACETAMINOPHEN 5-325 MG PO TABS
1.0000 | ORAL_TABLET | ORAL | Status: DC | PRN
Start: 2012-06-23 — End: 2012-07-21

## 2012-06-23 MED ORDER — MEASLES, MUMPS & RUBELLA VAC ~~LOC~~ INJ
0.5000 mL | INJECTION | Freq: Once | SUBCUTANEOUS | Status: AC
  Administered 2012-06-23: 0.5 mL via SUBCUTANEOUS
  Filled 2012-06-23: qty 0.5
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
## 2012-06-23 NOTE — Progress Notes (Signed)
Discharge instructions reviewed with patient.  Patient states understanding of home care and signs/symptoms to report to MD.  No home equipment  Needed.  Ambulated to NICU to visit baby and left for home from NICU.

## 2012-06-23 NOTE — Discharge Summary (Signed)
Pt was seen and eval by Dr. Despina Hidden and Dr. Berline Chough.  Scheduled to see ophthal early next week as an outpt.  clh-S

## 2012-06-23 NOTE — Progress Notes (Signed)
Pt seen by Dr. Despina Hidden and Dr. Berline Chough.  Visual field defect improved.  Scheduled to see opthal early next week.  Michelle [name], M.D., [facility]n Core

## 2012-06-23 NOTE — Progress Notes (Signed)
Subjective: Postpartum Day 3: Cesarean Delivery Patient reports incisional pain, tolerating PO, + flatus, + BM and no problems voiding.   Pt being interviewed by NICU social worker at time of evaluation  Objective: Vital signs in last 24 hours: Temp:  [97.8 F (36.6 C)-98.6 F (37 C)] 98.6 F (37 C) (12/08 0504) Pulse Rate:  [84-106] 92  (12/08 0504) Resp:  [18-20] 18  (12/08 0504) BP: (134-156)/(83-101) 134/83 mmHg (12/08 0504) SpO2:  [97 %-98 %] 97 % (12/08 0504) Weight:  [81.194 kg (179 lb)] 81.194 kg (179 lb) (12/08 0504)  Physical Exam:  General: alert, cooperative, distracted, fatigued and no distress Lochia: appropriate Uterine Fundus: firm Incision: healing well, no significant drainage, no dehiscence, no significant erythema DVT Evaluation: No evidence of DVT seen on physical exam. Negative Homan's sign. No cords or calf tenderness. No significant calf/ankle edema.   Basename 06/23/12 0516 06/22/12 1209  HGB 9.6* 9.7*  HCT 28.7* 28.1*    Assessment/Plan: Status post Cesarean section. Postoperative course complicated by visual field loss.  Plan for out pt follow up tomorrow with  D/c to home today with follow up with OB clinic in 1-2 weeks, myself in 3 days for BP check and Opthalmology tomorrow.  Andrena Mews, DO [facility] Resident - PGY-2 06/23/2012 10:17 AM

## 2012-06-23 NOTE — Discharge Summary (Signed)
Obstetric Discharge Summary Reason for Admission: Pre-Ecclampsia Prenatal Procedures: NST, Preeclampsia, ultrasound and BPP, non-reassuring fetal monitoring and growth restriction Intrapartum Procedures: cesarean: low cervical, transverse Postpartum Procedures: none Complications-Operative and Postpartum: Intraoperative Ecclamptic Seizure, PP visual field loss Hemoglobin  Date Value Range Status  06/23/2012 9.6* 12.0 - 15.0 g/dL Final     HCT  Date Value Range Status  06/23/2012 28.7* 36.0 - 46.0 % Final   Hospital Course: Pt was admitted on 12/4 with symptoms of pre-eclampsia.  Pt was found to have non-reassuring fetal tracing and growth restriction on [place] and was urgently taken to C-section.  Following delivery of fetus pt had Eclamptic Seizure on operating table that was responsive to versed.  She was given 24o of magnesium and BP was controlled using Lebetalol;  CT head was negative for intracranial bleed/mass.  Pt developed visual field defect on PPD#2 and opthalmology was consulted.  Plan immediate follow up as OP.  Pt is to be d/c to home with close follow up for BP.  Will need close follow up of her BP, smoking and psychiatric co-morbidities especially given tenuous neonate status.     Physical Exam:  General: alert, appears stated age, distracted and no distress Lochia: appropriate Uterine Fundus: firm Incision: healing well DVT Evaluation: No evidence of DVT seen on physical exam. Visual Field:  R sided peripheral visual field loss  Discharge Diagnoses: Ecclampsia with Pre-term C-Section Delivery   Discharge Information: Date: 06/23/2012 Activity: lifting restriction Diet: routine Medications: Ibuprofen and Percocet Condition: persisent visual field loss with occasional elevated but controlled BP Instructions: refer to practice specific booklet Discharge to: home Follow-up Information    Follow up with Vittorio Mohs, DO. Call on 06/26/2012. (Call to schedule appointment  for BP check)    Contact information:   [address] [place] [address] [phone]       Follow up with [facility]. In 2 weeks. (for incision check)    Contact information:   [facility] Number: [phone]      Call Edmon Crape, MD. (Call first thing in AM for appointment On 12/9)    Contact information:   [address] [place] [address] [phone]          Newborn Data: Live born female  Birth Weight: 3 lb 6.2 oz (1537 g) APGAR: 6, 8  Baby Eli remains in NICU on ventilator.   Andrena Mews, DO [facility] Resident - PGY-2 06/23/2012 10:41 AM

## 2012-06-25 ENCOUNTER — Telehealth: Payer: Self-pay | Admitting: Sports Medicine

## 2012-06-25 NOTE — Telephone Encounter (Signed)
Called patient back and she states she had been taking Roxicet 5/325mg  two  every 4 hours but recently has decreased to one every 4 hours because she is running out.  Advised will try to contact Dr. Berline Chough

## 2012-06-25 NOTE — Telephone Encounter (Signed)
Pt Rx 20 pills less than 48 hours ago.  Did not have this requirement during hospitalization.  Will be happy to discuss further pain management at tomorrow's appointment but this is not a medication to be continued more than 1 week post-op.  Ensure patient is taking Ibuprofen.  Pt does have hx of opioid abuse and currently in stressed situation with baby in NICU.  Concern for relapse but does require analgesia following abdominal surgery.  If pt's pain is not adequately controlled by medications provided at discharge needs to be re-evaluated in MAU.  Will forward to Dr. Marice Potter.

## 2012-06-25 NOTE — Telephone Encounter (Signed)
Pt has appt tomorrow and will be out of her pain meds before then.  She had a C section last week and needs something before tomorrow.  Needs to talk to nurse

## 2012-06-25 NOTE — Telephone Encounter (Signed)
Patient notified.  She has not been taking ibuprofen because she thought she was not suppose to take   due to BP. Advised if pain is severe she should go to MAU for evaluation. She voices understanding.

## 2012-06-25 NOTE — Telephone Encounter (Signed)
Message left to call back

## 2012-06-26 ENCOUNTER — Encounter: Payer: Self-pay | Admitting: Sports Medicine

## 2012-06-26 ENCOUNTER — Ambulatory Visit (INDEPENDENT_AMBULATORY_CARE_PROVIDER_SITE_OTHER): Payer: Medicaid Other | Admitting: Sports Medicine

## 2012-06-26 VITALS — BP 138/91 | HR 113 | Temp 99.1°F | Wt 166.5 lb

## 2012-06-26 DIAGNOSIS — O159 Eclampsia, unspecified as to time period: Secondary | ICD-10-CM

## 2012-06-26 DIAGNOSIS — R569 Unspecified convulsions: Secondary | ICD-10-CM

## 2012-06-26 MED ORDER — TRAMADOL HCL 50 MG PO TABS: 50.0000 mg | ORAL_TABLET | Freq: Four times a day (QID) | ORAL | Status: DC | PRN

## 2012-06-27 NOTE — Assessment & Plan Note (Addendum)
Pt with residual visual field loss.  Pt was to call for Ophthalmology f/u 2 days ago but did not call until immediately prior to appointment here.  CAlled and scheduled appointment for tomorrow morning personally during visit.   S/p seizure, normal CT BP normal

## 2012-06-28 NOTE — Progress Notes (Signed)
  [facility]  Patient name: Michelle [name] MRN [id number]  Date of birth: 22-Apr-1991  CC & HPI:  Michelle [name] is a 21 y.o. female presenting today for follow up of Eclampsia with visual field loss s/p C-Section.  # wound & pain control.  Pt requiring 2 percocet every 4-6 hours.  Almost out from rx 3 days ago.  Not taking ibuprofen because it was scratched out on her AVS from [facility] Discharge.    # Visual Field loss:  Pt was to call on Monday for appointment but called before immediately prior arriving to this appointment (Wednesday).  No change in visual fields, still lacking Left lateral field  ROS:  No headaches, no seizures, not on any meds for BP at this time  Pertinent History Reviewed:  Medical & Surgical Hx:  Reviewed: Significant for recent C-section and Eclamptic seizure Medications: Reviewed & Updated - see associated section Social History: Reviewed -  reports that she has been smoking Cigarettes.  She has been smoking about .5 packs per day. She has never used smokeless tobacco. BABY IN NICU, extubated today  Objective Findings:  Vitals:  Filed Vitals:   06/26/12 1444  BP: 138/91  Pulse: 113  Temp: 99.1 F (37.3 C)    PE: GENERAL:  Adult caucasian female. In mild to moderate discomfort; no respiratory distress. PSYCH: Alert and appropriately interactive; Insight:Fair   HNEENT:  EOMi, PERRLA, Ophthalmoscopic exam: limited/non-dilated, no hemorrhages, optic disk sharp, no av nicking,  ABDOMEN: surgical dressing clean, dry and intact, transparent, no erythema, not abdominal rigidity   Assessment & Plan:

## 2012-07-04 ENCOUNTER — Telehealth: Payer: Self-pay | Admitting: Sports Medicine

## 2012-07-04 NOTE — Telephone Encounter (Signed)
Called back and mother answered phone and states patient is not in . Message left to call back.

## 2012-07-04 NOTE — Telephone Encounter (Signed)
Pt called back and needs to talk to her about packing - wants to know if she needs to take packing from C Section off before she goes back on the 2nd

## 2012-07-04 NOTE — Telephone Encounter (Signed)
Called back and spoke with mother  who states Avis told her what to ask.  States she has a dressing over incision and wonders if she can remove it. Mother states she has not had staples removed yet . Has been two weeks since delivery. Advised mother that she will need to go back to [facility] for them to look at incision and give instructions and to remove staples. She will let patient know.

## 2012-07-04 NOTE — Telephone Encounter (Signed)
Needs to ask the nurse a question about her packing

## 2012-07-05 ENCOUNTER — Inpatient Hospital Stay (HOSPITAL_COMMUNITY)
Admission: AD | Admit: 2012-07-05 | Discharge: 2012-07-06 | Disposition: A | Discharge status: Home / Self Care | Payer: Medicaid Other | Source: Ambulatory Visit | Attending: Obstetrics & Gynecology | Admitting: Obstetrics & Gynecology

## 2012-07-05 DIAGNOSIS — O99893 Other specified diseases and conditions complicating puerperium: Secondary | ICD-10-CM | POA: Insufficient documentation

## 2012-07-05 DIAGNOSIS — R51 Headache: Secondary | ICD-10-CM | POA: Insufficient documentation

## 2012-07-05 DIAGNOSIS — R29898 Other symptoms and signs involving the musculoskeletal system: Secondary | ICD-10-CM

## 2012-07-06 DIAGNOSIS — O165 Unspecified maternal hypertension, complicating the puerperium: Secondary | ICD-10-CM

## 2012-07-06 DIAGNOSIS — R51 Headache: Secondary | ICD-10-CM

## 2012-07-06 MED ORDER — IBUPROFEN 200 MG PO TABS: 600.0000 mg | ORAL_TABLET | Freq: Four times a day (QID) | ORAL | Status: DC | PRN

## 2012-07-06 NOTE — MAU Note (Signed)
Removed abdominal dressing from C/S incision, removed steri strips, no staples visible would appear patient had dissolvable stitches, incision well approximated, no drainage or redness noted.

## 2012-07-06 NOTE — MAU Provider Note (Signed)
History     CSN: [id number]  Arrival date and time: 07/05/12 2355   First Provider Initiated Contact with Patient 07/06/12 0056      Chief Complaint  Patient presents with  . Suture / Staple Removal  . Headache   HPI Michelle [name] is 21yo, s/p c-section on 12/5 due preeclampsia and seizure during the c-section. Pt reports pain in her R temporal area for 16 days,  Since the delivery. Pt states the pain is getting worse since the delivery and she notice that yesterday was worse after she lay down on her R side. The pain is present only at touch, 9/10, no photophobia, no nausea, or other symptoms associated. Pt is taking tramadol for her c-section ( last dose was yesterday 1pm). Pt smoke 1/2-1pack/ day. No alcohol or drug use. Pt is  Breastfeeding.  Past Medical History  Diagnosis Date  . Tobacco abuse   . Bipolar I disorder   . PTSD (post-traumatic stress disorder)   . Post partum depression   . Menorrhagia   . 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 Gestational diabetes   . Pregnant   . Chronic headaches   . Bipolar disorder     Past Surgical History  Procedure Date  . No past surgeries   . Cesarean section 06/20/2012    Procedure: CESAREAN SECTION;  Surgeon: Allie Bossier, MD;  Location: [facility];  Service: Obstetrics;  Laterality: N/A;    Family History  Problem Relation Age of Onset  . Breast cancer Paternal Grandmother   . Colon cancer Paternal Grandfather   . Colon cancer Maternal Grandfather   . Colon polyps Maternal Aunt   . Diabetes Mother     maternal aunts and uncles  . Diabetes Sister   . Diabetes Father   . Heart disease Father     History  Substance Use Topics  . Smoking status: Current Every Day Smoker -- 0.5 packs/day    Types: Cigarettes  . Smokeless tobacco: Never Used     Comment: decreased smoking   . Alcohol Use: No     Comment: occasional    Allergies:  Allergies  Allergen Reactions  . Codeine Anaphylaxis and Hives     Rxn occurred when pt was 21 yr old.  Not sure of details.  Has taken percocet and tolerated it in the past.    Prescriptions prior to admission  Medication Sig Dispense Refill  . albuterol (PROVENTIL HFA;VENTOLIN HFA) 108 (90 BASE) MCG/ACT inhaler Inhale 2 puffs into the lungs every 4 (four) hours as needed for wheezing.  1 Inhaler  0  . ibuprofen (ADVIL,MOTRIN) 600 MG tablet Take 1 tablet (600 mg total) by mouth every 6 (six) hours.  30 tablet  0  . oxyCODONE-acetaminophen (PERCOCET/ROXICET) 5-325 MG per tablet Take 1-2 tablets by mouth every 4 (four) hours as needed (moderate - severe pain).  20 tablet  0  . Prenatal Vit-Fe Fumarate-FA (PRENATAL MULTIVITAMIN) TABS Take 1 tablet by mouth at bedtime.       . traMADol (ULTRAM) 50 MG tablet Take 1 tablet (50 mg total) by mouth every 6 (six) hours as needed. For pain  30 tablet  0    Review of Systems  Constitutional: Negative for fever.  HENT: Negative for hearing loss.   Eyes: Positive for blurred vision. Negative for photophobia and pain.  Cardiovascular: Negative for chest pain.  Gastrointestinal: Negative for nausea, vomiting and diarrhea.  Neurological: Negative  for headaches.   Physical Exam   Blood pressure 129/94, pulse 108, temperature 98.2 F (36.8 C), temperature source Oral, resp. rate 20, height 5' 2.5" (1.588 m), weight 71.759 kg (158 lb 3.2 oz), currently breastfeeding.  Physical Exam  Constitutional: She is oriented to person, place, and time. She appears well-developed.  HENT:  Head: Normocephalic and atraumatic.  Right Ear: External ear normal. Tympanic membrane is not perforated and not bulging.  Left Ear: External ear normal. Tympanic membrane is not perforated and not bulging.  Nose: Nose normal.  Mouth/Throat: Oropharynx is clear and moist. No oropharyngeal exudate.  Eyes: Conjunctivae normal and EOM are normal. Pupils are equal, round, and reactive to light.  Neck: Normal range of motion. Neck supple.   Cardiovascular: Normal rate, regular rhythm and normal heart sounds.   Respiratory: Effort normal and breath sounds normal.  GI: Soft.  Neurological: She is alert and oriented to person, place, and time. No cranial nerve deficit or sensory deficit.  Pain to palpation to the TMJ with clicking  MAU Course  Procedures  MDM   Assessment and Plan  Michelle [name] is 21yo s/p c-section on 12/5 who presents with pain in her R temporal side. This pain is possible due TMJ. Advised patient to follow up with her PCP. Ibuprofen for pain. Governor Specking 07/06/2012, 12:57 AM   I have seen and examined this patient and I agree with the above. Cam Hai 9:34 AM 07/06/2012

## 2012-07-06 NOTE — MAU Note (Signed)
Had c/s 06/20/12 due to preeclampsia at 33wks. Had seizure after delivery. Baby in NICU. Told to come here to have staples removed. Also have had pain in R temple that extends to R ear and down R cheek when I turn my head to L

## 2012-07-06 NOTE — Progress Notes (Signed)
Pain occurs R temple when touches area. Sore like bruise. Also has pain when turns head to L

## 2012-07-18 ENCOUNTER — Ambulatory Visit (INDEPENDENT_AMBULATORY_CARE_PROVIDER_SITE_OTHER): Payer: Medicaid Other | Admitting: Sports Medicine

## 2012-07-18 VITALS — BP 112/68 | HR 105 | Temp 97.9°F | Ht 62.0 in | Wt 158.0 lb

## 2012-07-18 DIAGNOSIS — F319 Bipolar disorder, unspecified: Secondary | ICD-10-CM

## 2012-07-18 DIAGNOSIS — R569 Unspecified convulsions: Secondary | ICD-10-CM

## 2012-07-18 DIAGNOSIS — H5340 Unspecified visual field defects: Secondary | ICD-10-CM

## 2012-07-18 DIAGNOSIS — Z98891 History of uterine scar from previous surgery: Secondary | ICD-10-CM

## 2012-07-18 DIAGNOSIS — O159 Eclampsia, unspecified as to time period: Secondary | ICD-10-CM

## 2012-07-18 DIAGNOSIS — M26609 Unspecified temporomandibular joint disorder, unspecified side: Secondary | ICD-10-CM

## 2012-07-18 DIAGNOSIS — F17200 Nicotine dependence, unspecified, uncomplicated: Secondary | ICD-10-CM

## 2012-07-18 DIAGNOSIS — Z9889 Other specified postprocedural states: Secondary | ICD-10-CM

## 2012-07-18 MED ORDER — VARENICLINE TARTRATE 0.5 MG X 11 & 1 MG X 42 PO MISC: ORAL | Status: DC

## 2012-07-18 NOTE — Assessment & Plan Note (Signed)
Will try chantix.  Pt with history of BiPolar disorder and hx of Suicide attempt multiple years ago.  Discussed risks/benefits and pt voices understanding  Pt has to follow up with myself or Dr. Raymondo Band in 1 week. Also given information for 1800 Quit Now. >follow up Mood/SI now on chantix

## 2012-07-18 NOTE — Patient Instructions (Addendum)
It was nice to see you today.   Today we discussed: 1. SMOKING  I have started you on  - varenicline (CHANTIX PAK) 0.5 MG X 11 & 1 MG X 42 tablet; Take one 0.5 mg tablet by mouth q day for 3 days, then increase to one 0.5 mg bid for 4 days, then increase to one 1 mg tablet q day.  Dispense: 53 tablet; Refill: 0  YOU NEED TO FOLLOW UP WITH ME or DR. KOVAL in 1 week  Please Call 1800-QUIT-NOW or log onto their website to get enrolled  3. S/P cesarean section  Please call and see if [facility] would like to see you back.  Their number is [phone].  Ask to be transferred to the high risk clinic.    4.  Jaw Pain  Follow up with the Dentist as previously scheduled    Please plan to return to see me in 1 week.  If you need anything prior to seeing me please call the clinic.  Please Bring all medications with you to each appointment.

## 2012-07-18 NOTE — Assessment & Plan Note (Addendum)
Will have follow up at [facility] in 2 weeks for 6 week check Discussed and encouraged to continue to attempt pumping q 2-3 hours.  Discussed role of FEnuGreek and Reglan.  #1 thing to try is to STOP SMOKING.  Will try OTC FenuGreek.  Reglan not proven, has significant side effects and have other correctable cause of milk supply issues.  Pt agreeable to attempt quitting and recognizes high likelihood of being unable to produce adequate breast milk for [employer]

## 2012-07-18 NOTE — Progress Notes (Signed)
  [facility]  Patient name: Michelle [name] MRN [id number]  Date of birth: [birth year]  CC & HPI:  SHAHANA [name] is a 22 y.o. female presenting today for 4 week post-partum follow up:  # s/p C-section - seen in MAU for wound check.  Having some itching, mild thin dark vaginal discharge over past couple of days.  No wound discharge.  # Breast feeding - seen by Lactation Consultant in NICU.  Having issues with milk supply.  Only getting a couple of drops with each pumping episode.  Encourged to continue q3 hour pumping.  Wants to discuss options for helping milk supply  # Jaw Pain: has appointment to see dentist  # S/p Seizure and persistent visual field defect.  Seen by Ophthalmology.  Pt reports had broken blood vessel that should heal on own.  No further problems.  No new weaknesses  # Smoking: continues to smoke 1/2 - 1 ppd.  Her and Partner trying to quit but having difficulty.  Interested in quitting "yesterday" but cannot.  Wants to try chantix  ROS:  Per HPI  Pertinent History Reviewed:  Medical & Surgical Hx:  Reviewed: Significant for recent Ecclampsia during C-Section, chronic pain issues, tobacco abuse since age 22 Medications: Reviewed & Updated - see associated section Social History: Reviewed -  reports that she has been smoking Cigarettes.  She has been smoking about .5 packs per day. She has never used smokeless tobacco.  Son Michelle [name] remains in NICU,  Working on taking po feeds from bottle.  Improving overall and hopes to have him home soon  Objective Findings:  Vitals:  Filed Vitals:   07/18/12 0838  BP: 112/68  Pulse: 105  Temp: 97.9 F (36.6 C)    PE: GENERAL:  Adult post-partum caucasian female. In no discomfort; no respiratory distress. Psychiatric: Alert, affect improved but markedly flat.  Very apathetic and depressed mood.  No evidence of SI/HI.  Lacking confidence and hesitant but recognizes importance of smoking cessation.   H&N: AT/[place],  trachea midline,  EENT:  MMM, no scleral icterus, EOMi, eye exam deferred HEART: RRR, S1/S2 heard, no murmur LUNGS: CTA B, no wheezes, no crackles EXTREMITIES: Moves all 4 extremities spontaneously  warm well perfused, no edema, bilateral DP and PT pulses 2/4.  Biceps reflexes 2+/4  Assessment & Plan:

## 2012-07-18 NOTE — Assessment & Plan Note (Signed)
Improving, no follow up scheduled

## 2012-07-21 ENCOUNTER — Encounter: Payer: Self-pay | Admitting: Sports Medicine

## 2012-07-21 NOTE — Assessment & Plan Note (Signed)
Continues to have visual field defect s/p seizure.  No further evidence of deficits, no headaches, no recurrent seizures

## 2012-07-21 NOTE — Assessment & Plan Note (Signed)
To see dentist >consider OMT if not improved by 6 week follow up

## 2012-07-21 NOTE — Assessment & Plan Note (Signed)
Appears clinically stable given worsening psychological stressors (NICU baby) Continue to follow and have early follow up >Refer for individual/family counseling if agreeable

## 2012-07-21 NOTE — Addendum Note (Signed)
Addended by: Gaspar Bidding D on: 07/21/2012 04:43 PM   Modules accepted: Level of Service

## 2012-07-22 ENCOUNTER — Telehealth: Payer: Self-pay | Admitting: Sports Medicine

## 2012-07-22 ENCOUNTER — Emergency Department (HOSPITAL_COMMUNITY): Payer: Medicaid Other

## 2012-07-22 ENCOUNTER — Encounter (HOSPITAL_COMMUNITY): Payer: Self-pay | Admitting: Emergency Medicine

## 2012-07-22 ENCOUNTER — Emergency Department (HOSPITAL_COMMUNITY)
Admission: EM | Admit: 2012-07-22 | Discharge: 2012-07-22 | Disposition: A | Discharge status: Home / Self Care | Payer: Medicaid Other | Attending: Emergency Medicine | Admitting: Emergency Medicine

## 2012-07-22 DIAGNOSIS — Z8669 Personal history of other diseases of the nervous system and sense organs: Secondary | ICD-10-CM | POA: Insufficient documentation

## 2012-07-22 DIAGNOSIS — N898 Other specified noninflammatory disorders of vagina: Secondary | ICD-10-CM | POA: Insufficient documentation

## 2012-07-22 DIAGNOSIS — Z8739 Personal history of other diseases of the musculoskeletal system and connective tissue: Secondary | ICD-10-CM | POA: Insufficient documentation

## 2012-07-22 DIAGNOSIS — Z8659 Personal history of other mental and behavioral disorders: Secondary | ICD-10-CM | POA: Insufficient documentation

## 2012-07-22 DIAGNOSIS — F4310 Post-traumatic stress disorder, unspecified: Secondary | ICD-10-CM | POA: Insufficient documentation

## 2012-07-22 DIAGNOSIS — Z9889 Other specified postprocedural states: Secondary | ICD-10-CM | POA: Insufficient documentation

## 2012-07-22 DIAGNOSIS — F17200 Nicotine dependence, unspecified, uncomplicated: Secondary | ICD-10-CM | POA: Insufficient documentation

## 2012-07-22 DIAGNOSIS — R110 Nausea: Secondary | ICD-10-CM | POA: Insufficient documentation

## 2012-07-22 DIAGNOSIS — Z8679 Personal history of other diseases of the circulatory system: Secondary | ICD-10-CM | POA: Insufficient documentation

## 2012-07-22 DIAGNOSIS — F319 Bipolar disorder, unspecified: Secondary | ICD-10-CM | POA: Insufficient documentation

## 2012-07-22 DIAGNOSIS — R109 Unspecified abdominal pain: Secondary | ICD-10-CM | POA: Insufficient documentation

## 2012-07-22 DIAGNOSIS — Z3202 Encounter for pregnancy test, result negative: Secondary | ICD-10-CM | POA: Insufficient documentation

## 2012-07-22 LAB — WET PREP, GENITAL
Clue Cells Wet Prep HPF POC: NONE SEEN
Trich, Wet Prep: NONE SEEN
Yeast Wet Prep HPF POC: NONE SEEN

## 2012-07-22 LAB — BASIC METABOLIC PANEL
BUN: 10 mg/dL (ref 6–23)
CO2: 23 mEq/L (ref 19–32)
Calcium: 9.1 mg/dL (ref 8.4–10.5)
Chloride: 103 mEq/L (ref 96–112)
Creatinine, Ser: 0.63 mg/dL (ref 0.50–1.10)
GFR calc Af Amer: >90 mL/min (ref >90)
GFR calc non Af Amer: >90 mL/min (ref >90)
Glucose, Bld: 106 mg/dL — ABNORMAL HIGH (ref 70–99)
Potassium: 3.7 mEq/L (ref 3.5–5.1)
Sodium: 139 mEq/L (ref 135–145)

## 2012-07-22 LAB — URINALYSIS, ROUTINE W REFLEX MICROSCOPIC
Bilirubin Urine: NEGATIVE
Glucose, UA: NEGATIVE mg/dL
Hgb urine dipstick: NEGATIVE
Ketones, ur: NEGATIVE mg/dL
Leukocytes, UA: NEGATIVE
Nitrite: NEGATIVE
Protein, ur: NEGATIVE mg/dL
Specific Gravity, Urine: 1.016 (ref 1.005–1.030)
Urobilinogen, UA: 0.2 mg/dL (ref 0.0–1.0)
pH: 6.5 (ref 5.0–8.0)

## 2012-07-22 LAB — CBC WITH DIFFERENTIAL/PLATELET
Basophils Absolute: 0 10*3/uL (ref 0.0–0.1)
Basophils Relative: 0 % (ref 0–1)
Eosinophils Absolute: 0.1 10*3/uL (ref 0.0–0.7)
Eosinophils Relative: 1 % (ref 0–5)
HCT: 38.5 % (ref 36.0–46.0)
Hemoglobin: 13.2 g/dL (ref 12.0–15.0)
Lymphocytes Relative: 19 % (ref 12–46)
Lymphs Abs: 3.6 10*3/uL (ref 0.7–4.0)
MCH: 32.2 pg (ref 26.0–34.0)
MCHC: 34.3 g/dL (ref 30.0–36.0)
MCV: 93.9 fL (ref 78.0–100.0)
Monocytes Absolute: 1.1 10*3/uL — ABNORMAL HIGH (ref 0.1–1.0)
Monocytes Relative: 6 % (ref 3–12)
Neutro Abs: 14.2 10*3/uL — ABNORMAL HIGH (ref 1.7–7.7)
Neutrophils Relative %: 75 % (ref 43–77)
Platelets: 194 10*3/uL (ref 150–400)
RBC: 4.1 MIL/uL (ref 3.87–5.11)
RDW: 12.6 % (ref 11.5–15.5)
WBC: 19 10*3/uL — ABNORMAL HIGH (ref 4.0–10.5)

## 2012-07-22 LAB — POCT PREGNANCY, URINE: Preg Test, Ur: NEGATIVE

## 2012-07-22 LAB — LIPASE, BLOOD: Lipase: 20 U/L (ref 11–59)

## 2012-07-22 MED ORDER — KETOROLAC TROMETHAMINE 30 MG/ML IJ SOLN
30.0000 mg | Freq: Once | INTRAMUSCULAR | Status: AC
  Administered 2012-07-22: 30 mg via INTRAVENOUS
  Filled 2012-07-22: qty 1

## 2012-07-22 MED ORDER — ONDANSETRON HCL 4 MG PO TABS: 4.0000 mg | ORAL_TABLET | Freq: Four times a day (QID) | ORAL | Status: DC

## 2012-07-22 MED ORDER — NAPROXEN 375 MG PO TABS: 375.0000 mg | ORAL_TABLET | Freq: Two times a day (BID) | ORAL | Status: DC

## 2012-07-22 MED ORDER — ONDANSETRON HCL 4 MG/2ML IJ SOLN
4.0000 mg | Freq: Once | INTRAMUSCULAR | Status: AC
  Administered 2012-07-22: 4 mg via INTRAVENOUS
  Filled 2012-07-22: qty 2

## 2012-07-22 NOTE — ED Notes (Signed)
Pt c/o RLQ pain starting today; pt 1 month post partum csection delivery; pt sts some nausea

## 2012-07-22 NOTE — ED Provider Notes (Signed)
History     CSN: [id number]  Arrival date & time 07/22/12  1422   First MD Initiated Contact with Patient 07/22/12 1812      Chief Complaint  Patient presents with  . Abdominal Pain    (Consider location/radiation/quality/duration/timing/severity/associated sxs/prior treatment) HPI  22 year old female who had a C-section a month ago presents complaining of low abnormal pain. Patient reports pain to her low abdomen when she woke up this morning. Pain did not wake her up. She described pain as a sharp and aching sensation, radiating up abdomen, moderate in severity, persistent, with mild nausea. She has never had pain like this before. She took some ibuprofen without relief. She complains of mild nausea without vomiting or diarrhea. She denies fever, chills, chest pain shortness of breath, dysuria, vaginal discharge, or rash. She denies any recent sexual activities. She reports the child is currently still in the NICU for the past month. She is currently not breast-feeding.  Past Medical History  Diagnosis Date  . Tobacco abuse   . Bipolar I disorder   . PTSD (post-traumatic stress disorder)   . Post partum depression   . Menorrhagia   . 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 Gestational diabetes   . Pregnant   . Chronic headaches   . Bipolar disorder   . H/O eclampsia     Past Surgical History  Procedure Date  . No past surgeries   . Cesarean section 06/20/2012    Procedure: CESAREAN SECTION;  Surgeon: Allie Bossier, MD;  Location: [facility];  Service: Obstetrics;  Laterality: N/A;    Family History  Problem Relation Age of Onset  . Breast cancer Paternal Grandmother   . Colon cancer Paternal Grandfather   . Colon cancer Maternal Grandfather   . Colon polyps Maternal Aunt   . Diabetes Mother     maternal aunts and uncles  . Diabetes Sister   . Diabetes Father   . Heart disease Father     History  Substance Use Topics  . Smoking status: Current Every  Day Smoker -- 0.5 packs/day    Types: Cigarettes  . Smokeless tobacco: Never Used     Comment: decreased smoking   . Alcohol Use: No     Comment: occasional    OB History    Grav Para Term Preterm Abortions TAB SAB Ect Mult Living   4 3 2 1      3      Obstetric Comments   C-Section Indication: Non-reassuring fetal tracing, growth delay & marked proteinuria & Pre-Eclampsia Eclamptic Seizure during C-section delivery      Review of Systems  Constitutional:       10 Systems reviewed and all are negative for acute change except as noted in the HPI.     Allergies  Codeine  Home Medications   Current Outpatient Rx  Name  Route  Sig  Dispense  Refill  . ALBUTEROL SULFATE HFA 108 (90 BASE) MCG/ACT IN AERS   Inhalation   Inhale 2 puffs into the lungs every 4 (four) hours as needed for wheezing.   1 Inhaler   0   . IBUPROFEN 200 MG PO TABS   Oral   Take 3 tablets (600 mg total) by mouth every 6 (six) hours as needed for pain.   30 tablet   0   . IBUPROFEN 600 MG PO TABS   Oral   Take 1 tablet (600 mg total)  by mouth every 6 (six) hours.   30 tablet   0   . PRENATAL MULTIVITAMIN CH   Oral   Take 1 tablet by mouth at bedtime.          . TRAMADOL HCL 50 MG PO TABS   Oral   Take 1 tablet (50 mg total) by mouth every 6 (six) hours as needed. For pain   30 tablet   0   . VARENICLINE TARTRATE 0.5 MG X 11 & 1 MG X 42 PO MISC      Take one 0.5 mg tablet by mouth q day for 3 days, then increase to one 0.5 mg bid for 4 days, then increase to one 1 mg tablet q day.   53 tablet   0     BP 134/81  Pulse 114  Temp 97.7 F (36.5 C) (Oral)  Resp 18  SpO2 99%  Breastfeeding? Yes  Physical Exam  Nursing note and vitals reviewed. Constitutional: She appears well-developed and well-nourished. No distress.  HENT:  Head: Normocephalic and atraumatic.  Eyes: Conjunctivae normal are normal.  Neck: Normal range of motion. Neck supple.  Cardiovascular: Normal rate and  regular rhythm.   Pulmonary/Chest: Effort normal and breath sounds normal. She exhibits no tenderness.  Abdominal: Soft. There is tenderness (tenderness along abdominal surgical scar without evidence of dehiscence, or infection..  No hernia or overlying skin changes noted.  ). There is no rebound and no guarding.  Genitourinary: Uterus normal. There is no rash or lesion on the right labia. There is no rash or lesion on the left labia. Cervix exhibits no motion tenderness and no discharge. Right adnexum displays no mass and no tenderness. Left adnexum displays no mass and no tenderness. No erythema, tenderness or bleeding around the vagina. No foreign body around the vagina. Vaginal discharge found.  Lymphadenopathy:       Right: No inguinal adenopathy present.       Left: No inguinal adenopathy present.    ED Course  Procedures (including critical care time)  Labs Reviewed  CBC WITH DIFFERENTIAL - Abnormal; Notable for the following:    WBC 19.0 (*)     Neutro Abs 14.2 (*)     Monocytes Absolute 1.1 (*)     All other components within normal limits  BASIC METABOLIC PANEL - Abnormal; Notable for the following:    Glucose, Bld 106 (*)     All other components within normal limits  LIPASE, BLOOD  URINALYSIS, ROUTINE W REFLEX MICROSCOPIC   No results found.   No diagnosis found.  Results for orders placed during the hospital encounter of 07/22/12  CBC WITH DIFFERENTIAL      Component Value Range   WBC 19.0 (*) 4.0 - 10.5 K/uL   RBC 4.10  3.87 - 5.11 MIL/uL   Hemoglobin 13.2  12.0 - 15.0 g/dL   HCT 16.1  09.6 - 04.5 %   MCV 93.9  78.0 - 100.0 fL   MCH 32.2  26.0 - 34.0 pg   MCHC 34.3  30.0 - 36.0 g/dL   RDW 40.9  81.1 - 91.4 %   Platelets 194  150 - 400 K/uL   Neutrophils Relative 75  43 - 77 %   Neutro Abs 14.2 (*) 1.7 - 7.7 K/uL   Lymphocytes Relative 19  12 - 46 %   Lymphs Abs 3.6  0.7 - 4.0 K/uL   Monocytes Relative 6  3 - 12 %   Monocytes  Absolute 1.1 (*) 0.1 - 1.0 K/uL     Eosinophils Relative 1  0 - 5 %   Eosinophils Absolute 0.1  0.0 - 0.7 K/uL   Basophils Relative 0  0 - 1 %   Basophils Absolute 0.0  0.0 - 0.1 K/uL  BASIC METABOLIC PANEL      Component Value Range   Sodium 139  135 - 145 mEq/L   Potassium 3.7  3.5 - 5.1 mEq/L   Chloride 103  96 - 112 mEq/L   CO2 23  19 - 32 mEq/L   Glucose, Bld 106 (*) 70 - 99 mg/dL   BUN 10  6 - 23 mg/dL   Creatinine, Ser 4.03  0.50 - 1.10 mg/dL   Calcium 9.1  8.4 - 47.4 mg/dL   GFR calc non Af Amer >90  >90 mL/min   GFR calc Af Amer >90  >90 mL/min  LIPASE, BLOOD      Component Value Range   Lipase 20  11 - 59 U/L  URINALYSIS, ROUTINE W REFLEX MICROSCOPIC      Component Value Range   Color, Urine YELLOW  YELLOW   APPearance CLEAR  CLEAR   Specific Gravity, Urine 1.016  1.005 - 1.030   pH 6.5  5.0 - 8.0   Glucose, UA NEGATIVE  NEGATIVE mg/dL   Hgb urine dipstick NEGATIVE  NEGATIVE   Bilirubin Urine NEGATIVE  NEGATIVE   Ketones, ur NEGATIVE  NEGATIVE mg/dL   Protein, ur NEGATIVE  NEGATIVE mg/dL   Urobilinogen, UA 0.2  0.0 - 1.0 mg/dL   Nitrite NEGATIVE  NEGATIVE   Leukocytes, UA NEGATIVE  NEGATIVE  WET PREP, GENITAL      Component Value Range   Yeast Wet Prep HPF POC NONE SEEN  NONE SEEN   Trich, Wet Prep NONE SEEN  NONE SEEN   Clue Cells Wet Prep HPF POC NONE SEEN  NONE SEEN   WBC, Wet Prep HPF POC FEW (*) NONE SEEN  POCT PREGNANCY, URINE      Component Value Range   Preg Test, Ur NEGATIVE  NEGATIVE   Dg Abd Acute W/chest  07/22/2012  *RADIOLOGY REPORT*  Clinical Data: Right lower quadrant abdominal pain.  ACUTE ABDOMEN SERIES (ABDOMEN 2 VIEW & CHEST 1 VIEW)  Comparison: CT 09/25/2011.  Findings: Lungs clear.  Cardiopericardial silhouette within normal limits.  Trachea midline.  No airspace disease or effusion. Bowel gas pattern is within normal limits.  No pathologic air fluid levels are identified.   Stool and bowel gas present in the rectosigmoid.  IMPRESSION: Normal acute abdominal series.    Original Report Authenticated By: Andreas Newport, M.D.     1. Abdominal pain 2. leukocytosis  MDM  Lower abdominal pain along abdominal surgical scar, which started this AM.  Abdomen nonsurgical. Has C-section 1 month ago.  Report she is having difficulty passing flatus. Will obtain acute abd series.  9:10 PM Workup has been unremarkable. No acute changes, and acute abdomen series shows no concerning finding.  Pt has elevated WBC of 19, however at her baseline. On rexamination pt feels more comfortable.  Still tender to the touch along surgical site.  I have low suspicion for appendicitis at this time.  I recommend pt to f/u with her PCP for further care.  Strict return precaution discusses, to r/o appendicitis if pain worsen.     BP 101/61  Pulse 92  Temp 98.7 F (37.1 C) (Oral)  Resp 18  SpO2 99%  I  have reviewed nursing notes and vital signs. I personally reviewed the imaging tests through PACS system  I reviewed available ER/hospitalization records thought the EMR   Fayrene Helper, [place] 07/22/12 2124

## 2012-07-22 NOTE — ED Notes (Signed)
Verified with mini lab that pts preg test was negative. Called Xray and notified pt ready for transport to Xray.

## 2012-07-22 NOTE — Telephone Encounter (Signed)
Returned call to patient and left message to call our office back.  Richardson, Jeannette Ann, RN  

## 2012-07-22 NOTE — ED Notes (Signed)
Patient transported to X-ray 

## 2012-07-22 NOTE — Telephone Encounter (Signed)
Pt is having cramping and hurting in her incision (C section) and needs to know if she needs to go to hospital

## 2012-07-22 NOTE — ED Notes (Signed)
T. Bowie, PA at bedside.  

## 2012-07-23 LAB — GC/CHLAMYDIA PROBE AMP
CT Probe RNA: NEGATIVE
GC Probe RNA: NEGATIVE

## 2012-07-23 NOTE — Telephone Encounter (Signed)
Called patient back.  She went to hospital yesterday for "side pain" and will call back as needed.  Gaylene Brooks, RN

## 2012-07-24 NOTE — ED Provider Notes (Signed)
Medical screening examination/treatment/procedure(s) were performed by non-physician practitioner and as supervising physician I was immediately available for consultation/collaboration.    Nelia Shi, MD 07/24/12 [phone]

## 2012-07-25 ENCOUNTER — Observation Stay (HOSPITAL_COMMUNITY)
Admission: EM | Admit: 2012-07-25 | Discharge: 2012-07-26 | Disposition: A | Discharge status: Home / Self Care | Payer: Medicaid Other | Attending: General Surgery | Admitting: General Surgery

## 2012-07-25 ENCOUNTER — Ambulatory Visit (INDEPENDENT_AMBULATORY_CARE_PROVIDER_SITE_OTHER): Payer: Medicaid Other | Admitting: Pharmacist

## 2012-07-25 ENCOUNTER — Ambulatory Visit (HOSPITAL_COMMUNITY): Payer: Medicaid Other | Admitting: Anesthesiology

## 2012-07-25 ENCOUNTER — Ambulatory Visit (HOSPITAL_COMMUNITY)
Admission: RE | Admit: 2012-07-25 | Discharge: 2012-07-25 | Disposition: A | Discharge status: Home / Self Care | Payer: Medicaid Other | Source: Ambulatory Visit | Attending: Family Medicine | Admitting: Family Medicine

## 2012-07-25 ENCOUNTER — Encounter (HOSPITAL_COMMUNITY): Payer: Self-pay

## 2012-07-25 ENCOUNTER — Encounter (HOSPITAL_COMMUNITY): Payer: Self-pay | Admitting: General Practice

## 2012-07-25 ENCOUNTER — Encounter (HOSPITAL_COMMUNITY): Payer: Self-pay | Admitting: Anesthesiology

## 2012-07-25 ENCOUNTER — Telehealth: Payer: Self-pay | Admitting: Family Medicine

## 2012-07-25 ENCOUNTER — Encounter: Payer: Self-pay | Admitting: Pharmacist

## 2012-07-25 ENCOUNTER — Encounter (HOSPITAL_COMMUNITY): Payer: Self-pay | Admitting: Emergency Medicine

## 2012-07-25 ENCOUNTER — Encounter (HOSPITAL_COMMUNITY)
Admission: EM | Disposition: A | Discharge status: Home / Self Care | Payer: Self-pay | Source: Home / Self Care | Attending: Emergency Medicine

## 2012-07-25 VITALS — BP 119/77 | HR 90 | Ht 62.5 in | Wt 158.9 lb

## 2012-07-25 DIAGNOSIS — R1031 Right lower quadrant pain: Secondary | ICD-10-CM

## 2012-07-25 DIAGNOSIS — R52 Pain, unspecified: Secondary | ICD-10-CM

## 2012-07-25 DIAGNOSIS — K3580 Unspecified acute appendicitis: Principal | ICD-10-CM | POA: Diagnosis present

## 2012-07-25 DIAGNOSIS — E119 Type 2 diabetes mellitus without complications: Secondary | ICD-10-CM | POA: Insufficient documentation

## 2012-07-25 DIAGNOSIS — F319 Bipolar disorder, unspecified: Secondary | ICD-10-CM | POA: Insufficient documentation

## 2012-07-25 DIAGNOSIS — K37 Unspecified appendicitis: Secondary | ICD-10-CM

## 2012-07-25 DIAGNOSIS — R109 Unspecified abdominal pain: Secondary | ICD-10-CM | POA: Insufficient documentation

## 2012-07-25 DIAGNOSIS — F17200 Nicotine dependence, unspecified, uncomplicated: Secondary | ICD-10-CM | POA: Insufficient documentation

## 2012-07-25 DIAGNOSIS — I10 Essential (primary) hypertension: Secondary | ICD-10-CM | POA: Insufficient documentation

## 2012-07-25 HISTORY — DX: Unspecified convulsions: R56.9

## 2012-07-25 HISTORY — DX: Essential (primary) hypertension: I10

## 2012-07-25 HISTORY — PX: LAPAROSCOPIC APPENDECTOMY: SHX408

## 2012-07-25 LAB — BASIC METABOLIC PANEL
BUN: 13 mg/dL (ref 6–23)
CO2: 26 mEq/L (ref 19–32)
Calcium: 9.6 mg/dL (ref 8.4–10.5)
Chloride: 101 mEq/L (ref 96–112)
Creatinine, Ser: 0.65 mg/dL (ref 0.50–1.10)
GFR calc Af Amer: >90 mL/min (ref >90)
GFR calc non Af Amer: >90 mL/min (ref >90)
Glucose, Bld: 100 mg/dL — ABNORMAL HIGH (ref 70–99)
Potassium: 4.3 mEq/L (ref 3.5–5.1)
Sodium: 138 mEq/L (ref 135–145)

## 2012-07-25 LAB — CBC
HCT: 37.6 % (ref 36.0–46.0)
Hemoglobin: 12.6 g/dL (ref 12.0–15.0)
MCH: 32.1 pg (ref 26.0–34.0)
MCHC: 33.5 g/dL (ref 30.0–36.0)
MCV: 95.7 fL (ref 78.0–100.0)
Platelets: 196 10*3/uL (ref 150–400)
RBC: 3.93 MIL/uL (ref 3.87–5.11)
RDW: 12.4 % (ref 11.5–15.5)
WBC: 11.6 10*3/uL — ABNORMAL HIGH (ref 4.0–10.5)

## 2012-07-25 SURGERY — APPENDECTOMY, LAPAROSCOPIC
Anesthesia: General | Site: Abdomen | Wound class: Contaminated

## 2012-07-25 MED ORDER — VARENICLINE TARTRATE 1 MG PO TABS
1.0000 mg | ORAL_TABLET | Freq: Two times a day (BID) | ORAL | Status: DC
  Administered 2012-07-25: 1 mg via ORAL
  Filled 2012-07-25 (×3): qty 1

## 2012-07-25 MED ORDER — MORPHINE SULFATE 2 MG/ML IJ SOLN
2.0000 mg | INTRAMUSCULAR | Status: DC | PRN
  Administered 2012-07-25: 2 mg via INTRAVENOUS
  Filled 2012-07-25: qty 1

## 2012-07-25 MED ORDER — KCL IN DEXTROSE-NACL 20-5-0.9 MEQ/L-%-% IV SOLN
INTRAVENOUS | Status: DC
  Administered 2012-07-25: 20:00:00 via INTRAVENOUS
  Filled 2012-07-25 (×2): qty 1000

## 2012-07-25 MED ORDER — SODIUM CHLORIDE 0.9 % IR SOLN
Status: DC | PRN
  Administered 2012-07-25: 1000 mL

## 2012-07-25 MED ORDER — 0.9 % SODIUM CHLORIDE (POUR BTL) OPTIME
TOPICAL | Status: DC | PRN
  Administered 2012-07-25: 1000 mL

## 2012-07-25 MED ORDER — IOHEXOL 300 MG/ML  SOLN
100.0000 mL | Freq: Once | INTRAMUSCULAR | Status: AC | PRN
Start: 2012-07-25 — End: 2012-07-25
  Administered 2012-07-25: 1 mL via INTRAVENOUS

## 2012-07-25 MED ORDER — OXYCODONE HCL 5 MG PO TABS: 5.0000 mg | ORAL_TABLET | Freq: Once | ORAL | Status: DC | PRN

## 2012-07-25 MED ORDER — VARENICLINE TARTRATE 0.5 MG X 11 & 1 MG X 42 PO MISC: 1.0000 | Freq: Two times a day (BID) | ORAL | Status: DC

## 2012-07-25 MED ORDER — ONDANSETRON HCL 4 MG/2ML IJ SOLN
INTRAMUSCULAR | Status: DC | PRN
  Administered 2012-07-25: 4 mg via INTRAVENOUS

## 2012-07-25 MED ORDER — FENTANYL CITRATE 0.05 MG/ML IJ SOLN
INTRAMUSCULAR | Status: DC | PRN
  Administered 2012-07-25: 100 ug via INTRAVENOUS
  Administered 2012-07-25: 50 ug via INTRAVENOUS
  Administered 2012-07-25: 100 ug via INTRAVENOUS

## 2012-07-25 MED ORDER — PROPOFOL 10 MG/ML IV BOLUS
INTRAVENOUS | Status: DC | PRN
  Administered 2012-07-25: 180 mg via INTRAVENOUS

## 2012-07-25 MED ORDER — ROCURONIUM BROMIDE 100 MG/10ML IV SOLN
INTRAVENOUS | Status: DC | PRN
  Administered 2012-07-25: 30 mg via INTRAVENOUS
  Administered 2012-07-25: 10 mg via INTRAVENOUS

## 2012-07-25 MED ORDER — ALBUTEROL SULFATE HFA 108 (90 BASE) MCG/ACT IN AERS: 2.0000 | INHALATION_SPRAY | RESPIRATORY_TRACT | Status: DC | PRN

## 2012-07-25 MED ORDER — DEXAMETHASONE SODIUM PHOSPHATE 4 MG/ML IJ SOLN
INTRAMUSCULAR | Status: DC | PRN
  Administered 2012-07-25: 8 mg via INTRAVENOUS

## 2012-07-25 MED ORDER — HYDROMORPHONE HCL PF 1 MG/ML IJ SOLN
0.2500 mg | INTRAMUSCULAR | Status: DC | PRN
  Administered 2012-07-25: 0.5 mg via INTRAVENOUS

## 2012-07-25 MED ORDER — ONDANSETRON HCL 4 MG/2ML IJ SOLN: 4.0000 mg | Freq: Four times a day (QID) | INTRAMUSCULAR | Status: DC | PRN

## 2012-07-25 MED ORDER — HEPARIN SODIUM (PORCINE) 5000 UNIT/ML IJ SOLN
5000.0000 [IU] | Freq: Three times a day (TID) | INTRAMUSCULAR | Status: DC
  Filled 2012-07-25 (×4): qty 1

## 2012-07-25 MED ORDER — ACETAMINOPHEN 325 MG PO TABS: 650.0000 mg | ORAL_TABLET | ORAL | Status: DC | PRN

## 2012-07-25 MED ORDER — ONDANSETRON HCL 4 MG PO TABS: 4.0000 mg | ORAL_TABLET | Freq: Four times a day (QID) | ORAL | Status: DC | PRN

## 2012-07-25 MED ORDER — LACTATED RINGERS IV SOLN
INTRAVENOUS | Status: DC
  Administered 2012-07-25: 50 mL/h via INTRAVENOUS

## 2012-07-25 MED ORDER — BUPIVACAINE-EPINEPHRINE 0.25% -1:200000 IJ SOLN
INTRAMUSCULAR | Status: DC | PRN
  Administered 2012-07-25: 10 mL

## 2012-07-25 MED ORDER — SUCCINYLCHOLINE CHLORIDE 20 MG/ML IJ SOLN
INTRAMUSCULAR | Status: DC | PRN
  Administered 2012-07-25: 120 mg via INTRAVENOUS

## 2012-07-25 MED ORDER — IOHEXOL 300 MG/ML  SOLN
25.0000 mL | INTRAMUSCULAR | Status: AC
  Administered 2012-07-25 (×2): 25 mL via ORAL

## 2012-07-25 MED ORDER — LIDOCAINE HCL (CARDIAC) 20 MG/ML IV SOLN
INTRAVENOUS | Status: DC | PRN
  Administered 2012-07-25: 80 mg via INTRAVENOUS

## 2012-07-25 MED ORDER — HYDROMORPHONE HCL PF 1 MG/ML IJ SOLN
INTRAMUSCULAR | Status: AC
  Filled 2012-07-25: qty 1

## 2012-07-25 MED ORDER — SODIUM CHLORIDE 0.9 % IV SOLN
1.0000 g | Freq: Once | INTRAVENOUS | Status: AC
  Administered 2012-07-25: 1 g via INTRAVENOUS
  Filled 2012-07-25 (×2): qty 1

## 2012-07-25 MED ORDER — OXYCODONE-ACETAMINOPHEN 5-325 MG PO TABS
1.0000 | ORAL_TABLET | ORAL | Status: DC | PRN
  Administered 2012-07-25 – 2012-07-26 (×3): 2 via ORAL
  Filled 2012-07-25 (×3): qty 2

## 2012-07-25 MED ORDER — OXYCODONE HCL 5 MG/5ML PO SOLN: 5.0000 mg | Freq: Once | ORAL | Status: DC | PRN

## 2012-07-25 MED ORDER — GLYCOPYRROLATE 0.2 MG/ML IJ SOLN
INTRAMUSCULAR | Status: DC | PRN
  Administered 2012-07-25: 0.6 mg via INTRAVENOUS

## 2012-07-25 MED ORDER — LACTATED RINGERS IV SOLN
INTRAVENOUS | Status: DC | PRN
  Administered 2012-07-25: 15:00:00 via INTRAVENOUS

## 2012-07-25 MED ORDER — NEOSTIGMINE METHYLSULFATE 1 MG/ML IJ SOLN
INTRAMUSCULAR | Status: DC | PRN
  Administered 2012-07-25: 4 mg via INTRAVENOUS

## 2012-07-25 MED ORDER — MIDAZOLAM HCL 5 MG/5ML IJ SOLN
INTRAMUSCULAR | Status: DC | PRN
  Administered 2012-07-25: 2 mg via INTRAVENOUS

## 2012-07-25 SURGICAL SUPPLY — 46 items
APPLIER CLIP ROT 10 11.4 M/L (STAPLE)
BLADE SURG ROTATE 9660 (MISCELLANEOUS) IMPLANT
CANISTER SUCTION 2500CC (MISCELLANEOUS) ×2 IMPLANT
CHLORAPREP W/TINT 26ML (MISCELLANEOUS) ×2 IMPLANT
CLIP APPLIE ROT 10 11.4 M/L (STAPLE) IMPLANT
CLOTH BEACON ORANGE TIMEOUT ST (SAFETY) ×2 IMPLANT
COVER SURGICAL LIGHT HANDLE (MISCELLANEOUS) ×2 IMPLANT
CUTTER LINEAR ENDO 35 ETS (STAPLE) IMPLANT
CUTTER LINEAR ENDO 35 ETS TH (STAPLE) ×2 IMPLANT
DECANTER SPIKE VIAL GLASS SM (MISCELLANEOUS) ×2 IMPLANT
DERMABOND ADVANCED (GAUZE/BANDAGES/DRESSINGS) ×1
DERMABOND ADVANCED .7 DNX12 (GAUZE/BANDAGES/DRESSINGS) ×1 IMPLANT
DRAPE UTILITY 15X26 W/TAPE STR (DRAPE) ×4 IMPLANT
ELECT REM PT RETURN 9FT ADLT (ELECTROSURGICAL) ×2
ELECTRODE REM PT RTRN 9FT ADLT (ELECTROSURGICAL) ×1 IMPLANT
ENDOLOOP SUT PDS II  0 18 (SUTURE)
ENDOLOOP SUT PDS II 0 18 (SUTURE) IMPLANT
GLOVE BIO SURGEON STRL SZ7 (GLOVE) ×2 IMPLANT
GLOVE BIO SURGEON STRL SZ8 (GLOVE) ×2 IMPLANT
GLOVE BIOGEL PI IND STRL 6.5 (GLOVE) ×1 IMPLANT
GLOVE BIOGEL PI IND STRL 8 (GLOVE) ×1 IMPLANT
GLOVE BIOGEL PI INDICATOR 6.5 (GLOVE) ×1
GLOVE BIOGEL PI INDICATOR 8 (GLOVE) ×1
GOWN PREVENTION PLUS XLARGE (GOWN DISPOSABLE) ×2 IMPLANT
GOWN STRL NON-REIN LRG LVL3 (GOWN DISPOSABLE) ×4 IMPLANT
KIT BASIN OR (CUSTOM PROCEDURE TRAY) ×2 IMPLANT
KIT ROOM TURNOVER OR (KITS) ×2 IMPLANT
NS IRRIG 1000ML POUR BTL (IV SOLUTION) ×2 IMPLANT
PAD ARMBOARD 7.5X6 YLW CONV (MISCELLANEOUS) ×4 IMPLANT
POUCH SPECIMEN RETRIEVAL 10MM (ENDOMECHANICALS) ×2 IMPLANT
RELOAD /EVU35 (ENDOMECHANICALS) IMPLANT
RELOAD CUTTER ETS 35MM STAND (ENDOMECHANICALS) IMPLANT
SCALPEL HARMONIC ACE (MISCELLANEOUS) ×2 IMPLANT
SET IRRIG TUBING LAPAROSCOPIC (IRRIGATION / IRRIGATOR) ×2 IMPLANT
SPECIMEN JAR SMALL (MISCELLANEOUS) ×2 IMPLANT
SUT VIC AB 4-0 PS2 27 (SUTURE) ×2 IMPLANT
SWAB COLLECTION DEVICE MRSA (MISCELLANEOUS) IMPLANT
TOWEL OR 17X24 6PK STRL BLUE (TOWEL DISPOSABLE) ×2 IMPLANT
TOWEL OR 17X26 10 PK STRL BLUE (TOWEL DISPOSABLE) ×2 IMPLANT
TRAY FOLEY CATH 14FR (SET/KITS/TRAYS/PACK) ×2 IMPLANT
TRAY LAPAROSCOPIC (CUSTOM PROCEDURE TRAY) ×2 IMPLANT
TROCAR HASSON GELL 12X100 (TROCAR) ×2 IMPLANT
TROCAR Z-THREAD FIOS 12X100MM (TROCAR) ×2 IMPLANT
TROCAR Z-THREAD FIOS 5X100MM (TROCAR) ×2 IMPLANT
TUBE ANAEROBIC SPECIMEN COL (MISCELLANEOUS) IMPLANT
WATER STERILE IRR 1000ML POUR (IV SOLUTION) ×2 IMPLANT

## 2012-07-25 NOTE — H&P (Signed)
Michelle [name] [birth year]  [id number].   Primary Care MD: family practice clinics  Chief Complaint/Reason for Consult: abdominal pain, acute appendicitis  HPI: This is a 22 yo female who is 1 month postpartum who had an emergency C-section due to pre-eclampsia and had a seizure during induction.  She has done well since then.  On Monday though, she developed RLQ abdominal pain with some mild nausea.  She came to the [facility] where she had a WBC of 19K, but was sent home.  Her pain improved over the past couple of days, but she has had some mild persistent anorexia.  She came to the clinic today due to some pain that she thought was secondary to her starting her Chantix.  FP thought she may have appendicitis and sent her for a CT scan which confirmed this suspicion.  She was sent to [facility] and we have been asked to evaluate her.  Review of systems: Please see HPI, otherwise negative.  Family History  Problem Relation Age of Onset  . Breast cancer Paternal Grandmother   . Colon cancer Paternal Grandfather   . Colon cancer Maternal Grandfather   . Colon polyps Maternal Aunt   . Diabetes Mother     maternal aunts and uncles  . Diabetes Sister   . Diabetes Father   . Heart disease Father     Past Medical History  Diagnosis Date  . Tobacco abuse   . Bipolar I disorder   . PTSD (post-traumatic stress disorder)   . Post partum depression   . Menorrhagia   . 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 Gestational diabetes   . Pregnant   . Chronic headaches   . Bipolar disorder   . H/O eclampsia   . Hypertension     Past Surgical History  Procedure Date  . No past surgeries   . Cesarean section 06/20/2012    Procedure: CESAREAN SECTION;  Surgeon: Allie Bossier, MD;  Location: [facility];  Service: Obstetrics;  Laterality: N/A;  . Tympanostomy tube placement   . Tonsillectomy and adenoidectomy     Social History:  reports that she has been smoking Cigarettes.  She has been  smoking about .25 packs per day. She has never used smokeless tobacco. She reports that she does not drink alcohol or use illicit drugs.  Allergies:  Allergies  Allergen Reactions  . Codeine Hives    Rxn occurred when pt was 22 yr old.  Not sure of details.  Has taken percocet and tolerated it in the past.     (Not in a hospital admission)  Blood pressure 100/64, pulse 93, temperature 98.9 F (37.2 C), temperature source Oral, resp. rate 18, last menstrual period 07/18/2012, SpO2 98.00%. Physical Exam: General: pleasant, WD, WN white female who is laying in bed in NAD HEENT: head is normocephalic, atraumatic.  Sclera are noninjected.  PERRL.  Ears and nose without any masses or lesions.  Mouth is pink and moist Heart: regular, rate, and rhythm.  Normal s1,s2. No obvious murmurs, gallops, or rubs noted.  Palpable radial and pedal pulses bilaterally Lungs: CTAB, no wheezes, rhonchi, or rales noted.  Respiratory effort nonlabored Abd: soft, tender in RLQ, voluntary guarding, but no rebound, ND, hypoactive BS, no masses, hernias, or organomegaly MS: all 4 extremities are symmetrical with no cyanosis, clubbing, or edema. Skin: warm and dry with no masses, lesions, or rashes Psych: A&Ox3 with an appropriate affect.  Results for orders placed in visit on 07/25/12 (from the past 48 hour(s))  CBC     Status: Abnormal   Collection Time   07/25/12  9:10 AM      Component Value Range Comment   WBC 11.6 (*) 4.0 - 10.5 K/uL    RBC 3.93  3.87 - 5.11 MIL/uL    Hemoglobin 12.6  12.0 - 15.0 g/dL    HCT 81.1  91.4 - 78.2 %    MCV 95.7  78.0 - 100.0 fL    MCH 32.1  26.0 - 34.0 pg    MCHC 33.5  30.0 - 36.0 g/dL    RDW 95.6  21.3 - 08.6 %    Platelets 196  150 - 400 K/uL    Ct Abdomen Pelvis W Contrast  07/25/2012  *RADIOLOGY REPORT*  Clinical Data:  Acute abdominal pain and leukocytosis  CT ABDOMEN AND PELVIS WITH CONTRAST  Technique:  Multidetector CT imaging of the abdomen and pelvis was  performed following the standard protocol during bolus administration of intravenous contrast. Oral contrast was also administered.  Contrast: 100 mL OMNIPAQUE IOHEXOL 300 MG/ML  SOLN  Comparison: None.  Findings: Lung bases are clear.  No focal liver lesions are identified.  There is no biliary duct dilatation.  Spleen, pancreas, adrenals, and kidneys appear normal.  There is a minimal umbilical hernia containing only fat.  In the pelvis, there is evidence of a collapsed ovarian cyst on the right with mild enhancement of the rim.  There is no other pelvic mass.  There is no pelvic fluid.  The appendix is mildly distended with ill-defined borders and mild surrounding mucosal thickening.  This appearance is consistent with a degree of acute appendiceal inflammation.  There is no bowel obstruction.  No free air or portal venous air.  There is no ascites, adenopathy, or abscess in the abdomen or pelvis.  Aorta is nonaneurysmal.  There are no blastic or lytic bone lesions.  IMPRESSION: Acute appendiceal inflammation.  Small collapsed ovarian cyst on the right.  Small umbilical hernia containing only fat.  Study otherwise unremarkable.   Original Report Authenticated By: Bretta Bang, M.D.        Assessment/Plan 1. Acute appendicitis 2. Pre-eclampsia, with seizure upon anesthesia induction 3. Bipolar disorder  Plan: 1. Admit, IV Invanz, to OR for appendectomy.  I have d/w the patient the risks and complications including bleeding, infection, open procedure, staple line leak, post op abscess.  She understands and is willing to proceed.  We did discuss if she was perforated she may be here for a couple additional days vs hopefully home tomorrow.  Nyko Gell E 07/25/2012, 2:56 PM Pager: [phone]

## 2012-07-25 NOTE — Telephone Encounter (Signed)
Received cal from Radiology that patient had evidence of appendicitis.  Called patient, who had left to go to NICU to see baby, and told her to go to Emergency Room (spoke with her husband).  He is taking her to ED now.  Called and told Charge nurse that she was coming and also called [facility] and passed along the information as well.

## 2012-07-25 NOTE — Anesthesia Postprocedure Evaluation (Signed)
Anesthesia Post Note  Patient: Michelle [name]  Procedure(s) Performed: Procedure(s) (LRB): APPENDECTOMY LAPAROSCOPIC (N/A)  Anesthesia type: General  Patient location: PACU  Post pain: Pain level controlled and Adequate analgesia  Post assessment: Post-op Vital signs reviewed, Patient's Cardiovascular Status Stable, Respiratory Function Stable, Patent Airway and Pain level controlled  Last Vitals:  Filed Vitals:   07/25/12 1645  BP:   Pulse: 63  Temp:   Resp:     Post vital signs: Reviewed and stable  Level of consciousness: awake, alert  and oriented  Complications: No apparent anesthesia complications

## 2012-07-25 NOTE — Progress Notes (Signed)
  Subjective:    Patient ID: Michelle [name], female    DOB: Dec 14, 1990, 22 y.o.   MRN: [id number]  HPI Patient arrives in good spirits accompanied by female (spouse/boyfriend?).    She reports doing well with Chantix.   She states that she believes her mood has actually improved since starting Chantix.  She state she is more giggly and happier. She denies any nausea with Chantix.  She is planning to increase dose to "maintenence dose" tomorrow.   She reports she is planning to room in with her NICU son tonight and bring him home tomorrow.   She reports she went to the ER earlier in the week with abdominal pain.   This pain remains today.  This problem was deferred for evaluation and management to Dr. Gwendolyn Grant.     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  However, having a new baby in addition to a 62 and 89 year old in the home may be challenging. Initiated varenicline tx one week ago and is planning to go to blue pills (1mg  tablets) tomorrow.  Clarified dose to be 1mg  BID. Patient counseled on purpose, proper use, and potential adverse effects, including GI upset, and reviewed low likelihood of potential change in mood.   Written information provided.    Total time in face-to-face counseling 15 minutes.  Patient seen with [name] PharmD, Pharmacy Resident.  Abdominal Pain - management per Dr. Gwendolyn Grant.   Follow up PRN with Dr. Berline Chough.

## 2012-07-25 NOTE — ED Notes (Signed)
Spoke with general surgery, PA reports she is on the way down to ED to speak with pt.

## 2012-07-25 NOTE — Op Note (Signed)
07/25/2012  4:13 PM  PATIENT:  Michelle [name]  22 y.o. female  PRE-OPERATIVE DIAGNOSIS:  ACUTE APPENDICITIS  POST-OPERATIVE DIAGNOSIS:  ACUTE APPENDICITIS  PROCEDURE:  Procedure(s): APPENDECTOMY LAPAROSCOPIC  SURGEON:  Surgeon(s): Liz Malady, MD  PHYSICIAN ASSISTANT:   ASSISTANTS: Magnus Ivan, RNFA  ANESTHESIA:   local and general  EBL:     BLOOD ADMINISTERED:none  DRAINS: none   SPECIMEN:  Excision  DISPOSITION OF SPECIMEN:  PATHOLOGY  COUNTS:  YES  DICTATION: .Dragon Dictation  Patient was identified in the preop holding area. Informed consent was obtained. She received intravenous antibiotics. She was brought to the operating room and general endotracheal anesthesia was administered by the anesthesia staff. Her abdomen was prepped and draped in sterile fashion after Foley was placed by nursing staff. Time out procedure was done. Infraumbilical region was infiltrated with quarter percent Marcaine. Infraumbilical incision was made. Subcutaneous tissues were dissected down revealing the anterior fascia. This was divided sharply along the midline. Peritoneal cavity was entered under direct vision. 0 Vicryl pursestring suture was placed on the fascial opening. Hassan trocar was inserted. Abdomen was insufflated with carbon dioxide in standard fashion. Under direct vision, left lower quadrant 12 mm port and a right mid abdomen 5 mm port were placed. Local was used at each port site. Laparoscopic exploration revealed an acutely inflamed appendix wrapped in omentum stuck to the white line of pull within the right lower quadrant. The base along the cecum was intact. The base was initially dissected. The base was then divided with Endo GIA with vascular load. There was excellent closure with staple line. The mesoappendix was then gradually divided with the harmonic scalpel. The omentum was freed up off the appendix. The remainder the mesoappendix was divided with harmonic scalpel.  There was excellent hemostasis. The appendix was very inflamed but not perforated. It was placed in an Endo Catch bag and removed from the infraumbilical port site. Area was copiously irrigated. There was excellent hemostasis. Irrigation fluid returned clear. Staple line was intact. Ports were removed under direct vision. Pneumoperitoneum was released. Infra-umbilical fascia was closed by tying the 0 Vicryl pursestring suture with care not to trap any intra-abdominal contents. All 3 wounds were copiously irrigated. Skin of each was closed with running 4 Vicryl followed by Dermabond. All counts were correct. Patient tolerated procedure well apparent complication was taken recovery in stable condition.  PATIENT DISPOSITION:  PACU - hemodynamically stable.   Delay start of Pharmacological VTE agent (>24hrs) due to surgical blood loss or risk of bleeding:  no  Violeta Gelinas, MD, MPH, FACS Pager: [phone]  1/9/20144:13 PM

## 2012-07-25 NOTE — Assessment & Plan Note (Signed)
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  However, having a new baby in addition to a 16 and 24 year old in the home may be challenging.  Quit date will be today.  Initiated varenicline tx one week ago and is planning to go to blue pills (1mg  tablets) tomorrow.  Clarified dose to be 1mg  BID. Patient counseled on purpose, proper use, and potential adverse effects, including GI upset, and reviewed low likelihood of potential change in mood.   Written information provided.    Total time in face-to-face counseling 15 minutes.  Patient seen with [name] PharmD, Pharmacy Resident.

## 2012-07-25 NOTE — ED Notes (Signed)
Pt sent here after outpt CT with appendicitis

## 2012-07-25 NOTE — Patient Instructions (Addendum)
For your belly pain: We are checking a white count and sending your for a CT of your abdomen. I will call you with the results.   For smoking: You're doing great! Try to increase Chantix to 1 mg twice daily.  Today is your quit day.  Schedule an appointment to follow up with Dr. Berline Chough.

## 2012-07-25 NOTE — Anesthesia Preprocedure Evaluation (Addendum)
Anesthesia Evaluation  Patient identified by MRN, date of birth, ID band Patient awake    Reviewed: Allergy & Precautions, H&P , NPO status , Patient's Chart, lab work & pertinent test results  History of Anesthesia Complications Negative for: history of anesthetic complications  Airway Mallampati: II TM Distance: >3 FB Neck ROM: full    Dental  (+) Teeth Intact and Dental Advisory Given   Pulmonary Current Smoker,          Cardiovascular hypertension,     Neuro/Psych  Headaches, Depression Bipolar Disorder    GI/Hepatic negative GI ROS, Neg liver ROS,   Endo/Other  diabetes, Type 2  Renal/GU negative Renal ROS     Musculoskeletal negative musculoskeletal ROS (+)   Abdominal   Peds  Hematology negative hematology ROS (+)   Anesthesia Other Findings   Reproductive/Obstetrics negative OB ROS                        Anesthesia Physical Anesthesia Plan  ASA: II  Anesthesia Plan: General   Post-op Pain Management:    Induction: Intravenous, Cricoid pressure planned and Rapid sequence  Airway Management Planned: Oral ETT  Additional Equipment:   Intra-op Plan:   Post-operative Plan: Extubation in OR  Informed Consent: I have reviewed the patients History and Physical, chart, labs and discussed the procedure including the risks, benefits and alternatives for the proposed anesthesia with the patient or authorized representative who has indicated his/her understanding and acceptance.     Plan Discussed with: CRNA and Surgeon  Anesthesia Plan Comments:         Anesthesia Quick Evaluation

## 2012-07-25 NOTE — Assessment & Plan Note (Signed)
Has had similar complaints in Feb 2013. However no leukocytosis at that time. Concern of course is for appendicitis. Not acute abdomen currently. Will repeat CBC here and send for stat CT scan.  I will call patient with results.

## 2012-07-25 NOTE — Transfer of Care (Signed)
Immediate Anesthesia Transfer of Care Note  Patient: Michelle [name]  Procedure(s) Performed: Procedure(s) (LRB) with comments: APPENDECTOMY LAPAROSCOPIC (N/A)  Patient Location: PACU  Anesthesia Type:General  Level of Consciousness: awake, alert  and oriented  Airway & Oxygen Therapy: Patient Spontanous Breathing  Post-op Assessment: Report given to PACU RN and Post -op Vital signs reviewed and stable  Post vital signs: Reviewed and stable  Complications: No apparent anesthesia complications

## 2012-07-25 NOTE — H&P (Signed)
Seen and CT reviewed. For lap appy as above.  I also D/W anesthesia at length. Patient examined and I agree with the assessment and plan  Violeta Gelinas, MD, MPH, FACS Pager: [phone]  07/25/2012 3:11 PM

## 2012-07-25 NOTE — ED Notes (Signed)
Pt c/o RLQ abd pain X 1 week. Pt reports she was seen here in ED for same issue but thought it was related to her recent c-section. Pt went to PCP for follow up appointment and her WBC was still high so he sent pt to have abd CT that confirmed appendicitis. Pt in nad.

## 2012-07-25 NOTE — ED Provider Notes (Signed)
History     CSN: [id number]  Arrival date & time 07/25/12  1253   First MD Initiated Contact with Patient 07/25/12 1328      Chief Complaint  Patient presents with  . Abdominal Pain    (Consider location/radiation/quality/duration/timing/severity/associated sxs/prior treatment) Patient is a 22 y.o. female presenting with abdominal pain. The history is provided by the patient (pt complains of rlq abd pain since monday). No language interpreter was used.  Abdominal Pain The primary symptoms of the illness include abdominal pain. The primary symptoms of the illness do not include fatigue or diarrhea. The current episode started more than 2 days ago. The onset of the illness was gradual. The problem has not changed since onset. Associated with: nothing. The patient states that she believes she is currently not pregnant. The patient has not had a change in bowel habit. Symptoms associated with the illness do not include chills, hematuria, frequency or back pain. Significant associated medical issues do not include PUD.    Past Medical History  Diagnosis Date  . Tobacco abuse   . Bipolar I disorder   . PTSD (post-traumatic stress disorder)   . Post partum depression   . Menorrhagia   . 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 Gestational diabetes   . Pregnant   . Chronic headaches   . Bipolar disorder   . H/O eclampsia   . Hypertension     Past Surgical History  Procedure Date  . No past surgeries   . Cesarean section 06/20/2012    Procedure: CESAREAN SECTION;  Surgeon: Allie Bossier, MD;  Location: [facility];  Service: Obstetrics;  Laterality: N/A;    Family History  Problem Relation Age of Onset  . Breast cancer Paternal Grandmother   . Colon cancer Paternal Grandfather   . Colon cancer Maternal Grandfather   . Colon polyps Maternal Aunt   . Diabetes Mother     maternal aunts and uncles  . Diabetes Sister   . Diabetes Father   . Heart disease Father      History  Substance Use Topics  . Smoking status: Current Every Day Smoker -- 0.5 packs/day    Types: Cigarettes  . Smokeless tobacco: Never Used     Comment: decreased smoking   . Alcohol Use: No     Comment: occasional    OB History    Grav Para Term Preterm Abortions TAB SAB Ect Mult Living   4 3 2 1      3      Obstetric Comments   C-Section Indication: Non-reassuring fetal tracing, growth delay & marked proteinuria & Pre-Eclampsia Eclamptic Seizure during C-section delivery      Review of Systems  Constitutional: Negative for chills and fatigue.  HENT: Negative for congestion, sinus pressure and ear discharge.   Eyes: Negative for discharge.  Respiratory: Negative for cough.   Cardiovascular: Negative for chest pain.  Gastrointestinal: Positive for abdominal pain. Negative for diarrhea.  Genitourinary: Negative for frequency and hematuria.  Musculoskeletal: Negative for back pain.  Skin: Negative for rash.  Neurological: Negative for seizures and headaches.  Hematological: Negative.   Psychiatric/Behavioral: Negative for hallucinations.    Allergies  Codeine  Home Medications   Current Outpatient Rx  Name  Route  Sig  Dispense  Refill  . ALBUTEROL SULFATE HFA 108 (90 BASE) MCG/ACT IN AERS   Inhalation   Inhale 2 puffs into the lungs every 4 (four) hours as  needed. For wheezing         . VARENICLINE TARTRATE 0.5 MG X 11 & 1 MG X 42 PO MISC   Oral   Take 1 mg by mouth 2 (two) times daily.           BP 128/68  Pulse 95  Temp 98.4 F (36.9 C) (Oral)  Resp 18  SpO2 96%  LMP 07/18/2012  Physical Exam  Constitutional: She is oriented to person, place, and time. She appears well-developed.  HENT:  Head: Normocephalic and atraumatic.  Eyes: Conjunctivae normal and EOM are normal. No scleral icterus.  Neck: Neck supple. No thyromegaly present.  Cardiovascular: Normal rate and regular rhythm.  Exam reveals no gallop and no friction rub.   No murmur  heard. Pulmonary/Chest: No stridor. She has no wheezes. She has no rales. She exhibits no tenderness.  Abdominal: She exhibits no distension. There is tenderness. There is no rebound.       Tender rlq  Musculoskeletal: Normal range of motion. She exhibits no edema.  Lymphadenopathy:    She has no cervical adenopathy.  Neurological: She is oriented to person, place, and time. Coordination normal.  Skin: No rash noted. No erythema.  Psychiatric: She has a normal mood and affect. Her behavior is normal.    ED Course  Procedures (including critical care time)   Labs Reviewed  BASIC METABOLIC PANEL   Ct Abdomen Pelvis W Contrast  07/25/2012  *RADIOLOGY REPORT*  Clinical Data:  Acute abdominal pain and leukocytosis  CT ABDOMEN AND PELVIS WITH CONTRAST  Technique:  Multidetector CT imaging of the abdomen and pelvis was performed following the standard protocol during bolus administration of intravenous contrast. Oral contrast was also administered.  Contrast: 100 mL OMNIPAQUE IOHEXOL 300 MG/ML  SOLN  Comparison: None.  Findings: Lung bases are clear.  No focal liver lesions are identified.  There is no biliary duct dilatation.  Spleen, pancreas, adrenals, and kidneys appear normal.  There is a minimal umbilical hernia containing only fat.  In the pelvis, there is evidence of a collapsed ovarian cyst on the right with mild enhancement of the rim.  There is no other pelvic mass.  There is no pelvic fluid.  The appendix is mildly distended with ill-defined borders and mild surrounding mucosal thickening.  This appearance is consistent with a degree of acute appendiceal inflammation.  There is no bowel obstruction.  No free air or portal venous air.  There is no ascites, adenopathy, or abscess in the abdomen or pelvis.  Aorta is nonaneurysmal.  There are no blastic or lytic bone lesions.  IMPRESSION: Acute appendiceal inflammation.  Small collapsed ovarian cyst on the right.  Small umbilical hernia  containing only fat.  Study otherwise unremarkable.   Original Report Authenticated By: Bretta Bang, M.D.      1. Appendicitis       MDM  General surgery to see pt       Benny Lennert, MD 07/25/12 [phone]

## 2012-07-25 NOTE — ED Notes (Signed)
Surgery holding reports they are ready for pt

## 2012-07-25 NOTE — ED Notes (Signed)
Pt last ate at noon.

## 2012-07-25 NOTE — Preoperative (Signed)
Beta Blockers   Reason not to administer Beta Blockers:Not Applicable 

## 2012-07-25 NOTE — Progress Notes (Signed)
Patient ID: Michelle [name], female   DOB: 08-07-1990, 22 y.o.   MRN: [id number] Michelle [name] is a 22 y.o. female with PMH significant for eclampsia and emergency C-section on 06/20/12 who presents to [facility]acy [facility] today for smoking cessation, but also reports RLQ pain of 3 days' duration.  I was called in to examine patient due to her RLQ pain:  1.  RLQ pain:  Began on Monday.  Described as sharp pain that progressed in severity until she presented to ED.  Had acute abdominal series which was negative at that time.  CBC revealed leukocytosis with left shift, which has been present since eclamptic episode.  Pain has somewhat diminished since then but is still present, 8/10 at worst.  Endorses anorexia.  Some nausea, no actual vomiting.  Tolerating PO fluids well.     Has been doing well since C-section delivery of baby.  Surgical scar pain has improved.  No fevers or chills.    The following portions of the patient's history were reviewed and updated as appropriate: allergies, current medications, past medical history, family and social history, and problem list.  Patient is a nonsmoker.  Past Medical History  Diagnosis Date  . Tobacco abuse   . Bipolar I disorder   . PTSD (post-traumatic stress disorder)   . Post partum depression   . Menorrhagia   . 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 Gestational diabetes   . Pregnant   . Chronic headaches   . Bipolar disorder   . H/O eclampsia     ROS as above otherwise neg. No Chest pain, palpitations, SOB, Fever, Chills, Abd pain, N/V/D.  Medications reviewed. Current Outpatient Prescriptions  Medication Sig Dispense Refill  . albuterol (PROVENTIL HFA;VENTOLIN HFA) 108 (90 BASE) MCG/ACT inhaler Inhale 2 puffs into the lungs every 4 (four) hours as needed for wheezing.  1 Inhaler  0  . varenicline (CHANTIX PAK) 0.5 MG X 11 & 1 MG X 42 tablet Take 1 mg by mouth 2 (two) times daily.        Exam:  BP 119/77  Pulse 90  Ht 5'  2.5" (1.588 m)  Wt 158 lb 14.4 oz (72.077 kg)  BMI 28.60 kg/m2 Gen: Well NAD HEENT: EOMI,  MMM Lungs: CTABL Nl WOB Heart: RRR no MRG Abd: Soft.  TTP in RLQ, moderate pain.  No guarding or rebound.  Diminished bowel sounds.  Nondistended.   Exts: Non edematous BL  LE, warm and well perfused.

## 2012-07-26 ENCOUNTER — Encounter (HOSPITAL_COMMUNITY): Payer: Self-pay | Admitting: General Surgery

## 2012-07-26 ENCOUNTER — Encounter: Payer: Self-pay | Admitting: Family Medicine

## 2012-07-26 MED ORDER — OXYCODONE-ACETAMINOPHEN 5-325 MG PO TABS: 1.0000 | ORAL_TABLET | ORAL | Status: DC | PRN

## 2012-07-26 NOTE — Progress Notes (Signed)
Agree Myers Tutterow, MD, MPH, FACS Pager: [phone]  

## 2012-07-26 NOTE — Progress Notes (Signed)
Discharge home.

## 2012-07-26 NOTE — Progress Notes (Signed)
General Surgery Daily Progress Note: Subjective: Doing well. Ate full meal last night. Passing gas, no bowel movement yet. Urinating. Pain controled with percocet. Sore around incisions. Ambulating down the hall.   Objective: Vital signs in last 24 hours: Temp:  [97 F (36.1 C)-98.9 F (37.2 C)] 97.5 F (36.4 C) (01/10 0617) Pulse Rate:  [63-95] 80  (01/10 0617) Resp:  [17-18] 17  (01/10 0617) BP: (100-128)/(54-77) 113/67 mmHg (01/10 0617) SpO2:  [88 %-100 %] 99 % (01/10 0617) Weight:  [158 lb (71.668 kg)-158 lb 14.4 oz (72.077 kg)] 158 lb (71.668 kg) (01/09 2228)    Intake/Output from previous day: 01/09 0701 - 01/10 0700 In: 1157.5 [I.V.:1157.5] Out: 1320 [Urine:1300; Blood:20] Intake/Output this shift:    General appearance: alert, cooperative and no distress Pulm: clear to auscultation bilaterally Cardio: regular rate and rhythm, S1, S2 normal, no murmur, click, rub or gallop GI: soft, non tender, bowel sounds present, incisions clean and intact with dermabond, no surrounding erythema.   Lab Results:   [facility] 07/25/12 0910  WBC 11.6*  HGB 12.6  HCT 37.6  PLT 196   BMET  Basename 07/25/12 1417  NA 138  K 4.3  CL 101  CO2 26  GLUCOSE 100*  BUN 13  CREATININE 0.65  CALCIUM 9.6   PT/INR No results found for this basename: LABPROT:2,INR:2 in the last 72 hours ABG No results found for this basename: PHART:2,PCO2:2,PO2:2,HCO3:2 in the last 72 hours  Studies/Results: Ct Abdomen Pelvis W Contrast  07/25/2012  *RADIOLOGY REPORT*  Clinical Data:  Acute abdominal pain and leukocytosis  CT ABDOMEN AND PELVIS WITH CONTRAST  Technique:  Multidetector CT imaging of the abdomen and pelvis was performed following the standard protocol during bolus administration of intravenous contrast. Oral contrast was also administered.  Contrast: 100 mL OMNIPAQUE IOHEXOL 300 MG/ML  SOLN  Comparison: None.  Findings: Lung bases are clear.  No focal liver lesions are identified.  There is  no biliary duct dilatation.  Spleen, pancreas, adrenals, and kidneys appear normal.  There is a minimal umbilical hernia containing only fat.  In the pelvis, there is evidence of a collapsed ovarian cyst on the right with mild enhancement of the rim.  There is no other pelvic mass.  There is no pelvic fluid.  The appendix is mildly distended with ill-defined borders and mild surrounding mucosal thickening.  This appearance is consistent with a degree of acute appendiceal inflammation.  There is no bowel obstruction.  No free air or portal venous air.  There is no ascites, adenopathy, or abscess in the abdomen or pelvis.  Aorta is nonaneurysmal.  There are no blastic or lytic bone lesions.  IMPRESSION: Acute appendiceal inflammation.  Small collapsed ovarian cyst on the right.  Small umbilical hernia containing only fat.  Study otherwise unremarkable.   Original Report Authenticated By: Bretta Bang, M.D.     Anti-infectives: Anti-infectives     Start     Dose/Rate Route Frequency Ordered Stop   07/25/12 1400   ertapenem (INVANZ) 1 g in sodium chloride 0.9 % 50 mL IVPB        1 g 100 mL/hr over 30 Minutes Intravenous  Once 07/25/12 1349 07/25/12 1459          Assessment/Plan: 22 yo female s/p lap chole for acute appendicitis, post op day 1. Doing well, eating full diet and pain controled with percocet.  Plan:  - D/C fluids - home today  LOS: 1 day    Michelle [name]  07/26/2012  

## 2012-07-26 NOTE — Discharge Summary (Signed)
Michelle Rylee, MD, MPH, FACS Pager: [phone]  

## 2012-07-26 NOTE — Progress Notes (Signed)
UR completed 

## 2012-07-26 NOTE — Discharge Summary (Signed)
Patient ID: Michelle [name] MRN: [id number] DOB/AGE: 1991/02/23 21 y.o.  Admit date: 07/25/2012 Discharge date: 07/26/2012  Procedures: laparoscopic appendectomy   Consults: None  Reason for Admission: This is a 21 yo female who is 1 month postpartum who had an emergency C-section due to pre-eclampsia and had a seizure during induction. She has done well since then. On Monday though, she developed RLQ abdominal pain with some mild nausea. She came to the [facility] where she had a WBC of 19K, but was sent home. Her pain improved over the past couple of days, but she has had some mild persistent anorexia. She came to the clinic today due to some pain that she thought was secondary to her starting her Chantix. FP thought she may have appendicitis and sent her for a CT scan which confirmed this suspicion. She was sent to [facility] and we have been asked to evaluate her.  Admission Diagnoses:  1. Acute appendicitis  Hospital Course: The patient was admitted and taken to the operating room where she underwent a lap appy.  She tolerated the procedure well.  Her diet was advanced as tolerated and her pain was well controlled.  On POD# 1, she was stable for dc home  Discharge Diagnoses:  Principal Problem:  *Acute appendicitis s/p lap appy  Discharge Medications:   Medication List     As of 07/26/2012  8:28 AM    TAKE these medications         albuterol 108 (90 BASE) MCG/ACT inhaler   Commonly known as: PROVENTIL HFA;VENTOLIN HFA   Inhale 2 puffs into the lungs every 4 (four) hours as needed. For wheezing      oxyCODONE-acetaminophen 5-325 MG per tablet   Commonly known as: PERCOCET/ROXICET   Take 1-2 tablets by mouth every 4 (four) hours as needed.      varenicline 0.5 MG X 11 & 1 MG X 42 tablet   Commonly known as: CHANTIX PAK   Take 1 mg by mouth 2 (two) times daily.        Discharge Instructions:     Follow-up Information    Follow up with [facility]. On 08/13/2012. (2:00pm,  arrive at 1:30pm)    Contact information:   [address]   [place] [address] [phone]          Signed: Letha Cape 07/26/2012, 8:28 AM

## 2012-07-26 NOTE — Progress Notes (Signed)
Patient ID: Michelle [name], female   DOB: 02-16-1991, 22 y.o.   MRN: [id number] Reviewed: Agree with Dr. Macky Lower documentation and management.

## 2012-07-26 NOTE — Addendum Note (Signed)
Addended byGwendolyn Grant, Newt Lukes on: 07/26/2012 05:02 PM   Modules accepted: Level of Service

## 2012-07-29 ENCOUNTER — Encounter: Payer: Self-pay | Admitting: Family Medicine

## 2012-07-29 ENCOUNTER — Ambulatory Visit (INDEPENDENT_AMBULATORY_CARE_PROVIDER_SITE_OTHER): Payer: Medicaid Other | Admitting: Family Medicine

## 2012-07-29 ENCOUNTER — Other Ambulatory Visit (HOSPITAL_COMMUNITY)
Admission: RE | Admit: 2012-07-29 | Discharge: 2012-07-29 | Disposition: A | Discharge status: Home / Self Care | Payer: Medicaid Other | Source: Ambulatory Visit | Attending: Family Medicine | Admitting: Family Medicine

## 2012-07-29 ENCOUNTER — Telehealth (INDEPENDENT_AMBULATORY_CARE_PROVIDER_SITE_OTHER): Payer: Medicaid Other | Admitting: *Deleted

## 2012-07-29 VITALS — BP 122/88 | HR 102 | Ht 62.0 in | Wt 156.8 lb

## 2012-07-29 DIAGNOSIS — N898 Other specified noninflammatory disorders of vagina: Secondary | ICD-10-CM

## 2012-07-29 DIAGNOSIS — N719 Inflammatory disease of uterus, unspecified: Secondary | ICD-10-CM | POA: Insufficient documentation

## 2012-07-29 DIAGNOSIS — N939 Abnormal uterine and vaginal bleeding, unspecified: Secondary | ICD-10-CM

## 2012-07-29 DIAGNOSIS — R102 Pelvic and perineal pain: Secondary | ICD-10-CM

## 2012-07-29 DIAGNOSIS — Z113 Encounter for screening for infections with a predominantly sexual mode of transmission: Secondary | ICD-10-CM | POA: Insufficient documentation

## 2012-07-29 DIAGNOSIS — N920 Excessive and frequent menstruation with regular cycle: Secondary | ICD-10-CM

## 2012-07-29 LAB — POCT HEMOGLOBIN: Hemoglobin: 13.5 g/dL (ref 12.2–16.2)

## 2012-07-29 MED ORDER — DOXYCYCLINE HYCLATE 100 MG PO TABS: 100.0000 mg | ORAL_TABLET | Freq: Two times a day (BID) | ORAL | Status: DC

## 2012-07-29 NOTE — Assessment & Plan Note (Addendum)
A: Vaginal bleeding for >4 weeks postpartum with CMT and uterine fundal tenderness on bimanual exam. No fevers, but with tenderness and bleeding, will treat as presumptive endometritis; also on DDx is complication from recent appendectomy, but exam is not entirely consistent with peritonitis/surgical abdomen and pt is not toxic-appearing. P: GC/Chlamydia and cervical culture swabs obtained. [facility] Rx for doxycycline 100 mg BID for 14 days. Pt to f/u with PCP in two days, advised to keep this appointment. If continued/worse bleeding and/or abdominal pain, could consider abd/pelvic CT.

## 2012-07-29 NOTE — Telephone Encounter (Signed)
Patient  With baby for weight check. She reports appendectomy on 10/09. Since then she has been having vaginal bleeding light pink in color which she reports as a large amount. Soaking a large overnight pad.  appointment scheduled at 1:30 today for work in.  Dr. Jennette Kettle consulted and she advises to check HGB now before patent leaves office . Results of HGB 13.5.

## 2012-07-29 NOTE — Progress Notes (Signed)
  Subjective:    Patient ID: Michelle [name], female    DOB: 1991/05/09, 22 y.o.   MRN: [id number]  HPI: Pt presents with complaint of vaginal bleeding. Pt gave birth on 12/5 by emergency c-section (and suffered an intra-op eclamptic seizure) and of note underwent laparascopic appendectomy on 1/9. Pt reports she has had vaginal bleeding off and on since delivery; she had light bleeding and passage of clots for about 2 weeks after delivery, then bleeding started again 2-3 days after stopping. Bleeding again arrested spontaneously after several days but began again on 1/2. For the last four days (i.e., since after appendectomy), she has had constant bleeding. Bleeding was initially pink but today has been bright red and with clots. For the past four days, she has been using a pad almost every hour. She has had no history of bleeding since before giving birth; previously her periods were regular/monthly and lasted 6-7 days but were described as light. Last sexual activity was prior to delivery. Not breast feeding. Pt brought baby in for check today (RN visit) and pt's Hb POC was 13.5 at that time.  Review of Systems: Denies headache, dizziness, or weakness. Does endorse lightheadedness yesterday and pt reports she fell backwards onto her bottom (no loss of consciousness, did not hit her head). No N/V, fever/chills, depression/anhedonia, or dysuria. Last BM yesterday, loose but no blood (but there was blood in the toilet due to vaginal bleeding). Also endorses some decreased appetite for several days, as well as incisional pain s/p appendectomy.     Objective:   Physical Exam BP 122/88  Pulse 102  Ht 5\' 2"  (1.575 m)  Wt 156 lb 12.8 oz (71.124 kg)  BMI 28.68 kg/m2  LMP 07/25/2012  Breastfeeding? No Gen: adult female, appears tired but not acutely distressed, slightly pale skin and mucous membranes Pulm: CTAB, no wheezes Card: regular rhythm, slight tachycardia but no murmur Abd: soft, non-distended;  periumbilical tenderness to light palpation but without guarding; well-healing Pfannensteil incision and 3 laparascopic incisions GU: speculum exam reveals fresh blood in vaginal vault and small amount of clear mucousy cervical discharge but no frank/brisk bleeding  On bimanual exam, there is positive CMT and fundal uterine tenderness; no discrete/obvious masses     Assessment & Plan:

## 2012-07-29 NOTE — Patient Instructions (Signed)
Thank you for coming in, today! I'm sorry you're not feeling well and that you have had such a rough time after your delivery. We think that you have an infection called endometritis, an infection of the uterus that can occur after delivery. You should take doxycycline 100 mg by mouth twice per day for 14 days. Make sure to keep your appointment with Dr. Berline Chough on Wednesday. If you have any of the following symptoms, call the clinic or go to the ED:    Fever over 100.3 that does not go away or keeps coming back even with taking Tylenol.    Persistent nausea and/or vomiting.    Bleeding or discharge from your vagina that continues to get worse despite being treated.    Increased pain in your abdomen, especially if it gets to the point that moving makes the pain intolerable. Please call the clinic if you have any questions or concerns. --Dr. Casper Harrison

## 2012-07-31 ENCOUNTER — Encounter: Payer: Self-pay | Admitting: Sports Medicine

## 2012-07-31 ENCOUNTER — Ambulatory Visit (INDEPENDENT_AMBULATORY_CARE_PROVIDER_SITE_OTHER): Payer: Medicaid Other | Admitting: Sports Medicine

## 2012-07-31 VITALS — BP 124/84 | HR 104 | Temp 97.5°F | Ht 62.0 in | Wt 158.4 lb

## 2012-07-31 DIAGNOSIS — R52 Pain, unspecified: Secondary | ICD-10-CM

## 2012-07-31 DIAGNOSIS — Z3490 Encounter for supervision of normal pregnancy, unspecified, unspecified trimester: Secondary | ICD-10-CM

## 2012-07-31 DIAGNOSIS — Z3480 Encounter for supervision of other normal pregnancy, unspecified trimester: Secondary | ICD-10-CM

## 2012-07-31 DIAGNOSIS — F17200 Nicotine dependence, unspecified, uncomplicated: Secondary | ICD-10-CM

## 2012-07-31 DIAGNOSIS — R1031 Right lower quadrant pain: Secondary | ICD-10-CM

## 2012-07-31 DIAGNOSIS — N719 Inflammatory disease of uterus, unspecified: Secondary | ICD-10-CM

## 2012-07-31 MED ORDER — TRAMADOL HCL 50 MG PO TABS: 50.0000 mg | ORAL_TABLET | Freq: Three times a day (TID) | ORAL | Status: DC | PRN

## 2012-07-31 MED ORDER — AMOXICILLIN-POT CLAVULANATE 875-125 MG PO TABS: 1.0000 | ORAL_TABLET | Freq: Two times a day (BID) | ORAL | Status: DC

## 2012-07-31 MED ORDER — VARENICLINE TARTRATE 1 MG PO TABS: 1.0000 mg | ORAL_TABLET | Freq: Two times a day (BID) | ORAL | Status: DC

## 2012-07-31 NOTE — Assessment & Plan Note (Addendum)
Change from Doxy to Augmentin Spoke with Dr. Macon Large regarding follow up and feels as though she is being appropriately cared for.   Consider CT if becomes febrile, or significantly worsening pain

## 2012-07-31 NOTE — Patient Instructions (Addendum)
Return in 1 week for a post partum visit Stop your Doxycycline Start Augmentin  Endometritis Endometritis is an irritation, soreness, and swelling (inflammation) of the lining of the uterus (endometrium).  CAUSES   Bacterial infections.  Sexually transmitted infections (STIs).  Having a miscarriage or childbirth, especially after a long labor or cesarean delivery.  Certain gynecological procedures (D&C, hysteroscopy, or contraceptive insertion). SYMPTOMS   Fever.  Lower abdominal or pelvic pain.  Abnormal vaginal discharge or bleeding.  Abdominal bloating (distention) or swelling.  General discomfort or ill feeling.  Discomfort with bowel movements. DIAGNOSIS  A physical and pelvic exam are performed. Other tests may include:  Cultures from the cervix.  Blood tests.  Examining a tissue sample of the uterine lining (endometrial biopsy).  Examining discharge under a microscope (wet prep).  Laparoscopy. TREATMENT  Antibiotic medicines are usually given. Other treatments may include:  Fluids through the vein (IV fluids).  Rest. HOME CARE INSTRUCTIONS   Take over-the-counter or prescription medicines for pain, discomfort, or fever as directed by your caregiver.  Resume your normal diet and activities as directed or as tolerated.  Do not douche or have sexual intercourse until your caregiver says it is okay.  Take your antibiotics as directed. Finish them even if you start to feel better.  Do not have sexual intercourse until your partner has been treated if your endometritis is caused by an STI. SEEK IMMEDIATE MEDICAL CARE IF:   You have swelling or increasing pain in the abdomen.  You have a fever.  You have bad smelling vaginal discharge, or you have an increased amount of discharge.  You have abnormal vaginal bleeding.  Your medicine is not helping with the pain.  You experience any problems that may be related to the medicine you are taking.  You  have nausea and vomiting, or you cannot keep foods down.  You have pain with bowel movements. MAKE SURE YOU:   Understand these instructions.  Will watch your condition.  Will get help right away if you are not doing well or get worse. Document Released: 06/27/2001 Document Revised: 09/25/2011 Document Reviewed: 01/30/2011 [facility] Patient Information 2013 [place], [place].

## 2012-08-01 LAB — WOUND CULTURE
Gram Stain: NONE SEEN
Organism ID, Bacteria: NORMAL

## 2012-08-01 NOTE — Progress Notes (Signed)
  [facility]  Patient name: TARIAH TRANSUE MRN [id number]  Date of birth: 12/20/1990  CC & HPI:  YEILA MORRO is a 22 y.o. female presenting today for follow up of complicated course of preterm delivery with her son Theone Murdoch.  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    Pregnancy and postpartum period complicated by tobacco dependence.  Now smoke free X2 days. On Chantix  Diagnosed with Endometritis on 07/31/12 due to new/persistent vaginal bleeding with CMT and uterine fundal tenderness  Presenting today for follow up Endometritis and post partum visit:  Post partum:  Theone Murdoch has been discharged and seen today in my clinic.  Bottle feeding as difficulty with breast feeding then complicated by above complications.  Unsure as to what methods of contraception but interested in nexplanon vs mirena.   Endometritis.  Doing better. Pain improved.  Afebrile. Bleeding significantly improved.  ------------------------------------------------------------------------------------------------------------------ Medication Compliance: compliant most of the time    ROS:  PER HPI  Pertinent History Reviewed:  Medical & Surgical Hx:  Reviewed: Significant for as above Medications: Reviewed & Updated - see associated section Social History: Reviewed -  reports that she has quit smoking. Her smoking use included Cigarettes. She has a 3.5 pack-year smoking history. She has never used smokeless tobacco.   Objective Findings:  Vitals:  Filed Vitals:   07/31/12 0954  BP: 124/84  Pulse: 104  Temp: 97.5 F (36.4 C)    PE: GENERAL:   Young adult caucasian female. In no discomfort; no respiratory distress. PSYCH: Alert and appropriately interactive; Insight:Fair   H&N: AT/[place], trachea midline EENT:  MMM, no scleral icterus, EOMi HEART: RRR, S1/S2 heard, no murmur LUNGS: CTA B, no wheezes, no crackles Abdomen:  +BS, soft.  No rigidity, no organomegaly.  Slight TTP with mild/moderate palpation of RLQ.  Pfannenstiel incision well healed.  Umbilical, LLQ and RLQ port sites healing well, without erythema or exudate  EXTREMITIES: Moves all 4 extremities spontaneously, warm well perfused, no edema, bilateral DP and PT pulses 2/4.   Pelvic Exam deferred.    Assessment & Plan:

## 2012-08-01 NOTE — Assessment & Plan Note (Signed)
Still present, likely due to post surgical changes from appendectomy. Will continue to monitor. Has follow up with surgery

## 2012-08-01 NOTE — Assessment & Plan Note (Signed)
Will follow up in 1 week for post partum visit. To let us know if she would like Nexplanon or Mirena

## 2012-08-08 ENCOUNTER — Ambulatory Visit: Payer: Medicaid Other | Admitting: Sports Medicine

## 2012-08-13 ENCOUNTER — Encounter (INDEPENDENT_AMBULATORY_CARE_PROVIDER_SITE_OTHER): Payer: Medicaid Other

## 2012-08-13 ENCOUNTER — Encounter (INDEPENDENT_AMBULATORY_CARE_PROVIDER_SITE_OTHER): Payer: Medicaid Other | Admitting: General Surgery

## 2012-08-20 ENCOUNTER — Encounter (INDEPENDENT_AMBULATORY_CARE_PROVIDER_SITE_OTHER): Payer: Self-pay | Admitting: Internal Medicine

## 2012-08-20 ENCOUNTER — Ambulatory Visit (INDEPENDENT_AMBULATORY_CARE_PROVIDER_SITE_OTHER): Payer: Medicaid Other | Admitting: Internal Medicine

## 2012-08-20 VITALS — BP 100/70 | HR 74 | Resp 16 | Ht 62.5 in | Wt 158.0 lb

## 2012-08-20 DIAGNOSIS — K3580 Unspecified acute appendicitis: Secondary | ICD-10-CM

## 2012-08-20 NOTE — Patient Instructions (Signed)
May resume regular activity without restrictions. Follow up as needed. Call with questions or concerns.  

## 2012-08-20 NOTE — Progress Notes (Signed)
  Subjective: Pt returns to the clinic today after undergoing laparoscopic appendectomy on 07/25/12 by Dr. Janee Morn.  The patient is tolerating their diet well and is having no severe pain.  Bowel function is good.  No problems with the wounds.  Objective: Vital signs in last 24 hours: Reviewed  PE: Abd: soft, non-tender, +bs, incisions well healed  Lab Results:  No results found for this basename: WBC:2,HGB:2,HCT:2,PLT:2 in the last 72 hours BMET No results found for this basename: NA:2,K:2,CL:2,CO2:2,GLUCOSE:2,BUN:2,CREATININE:2,CALCIUM:2 in the last 72 hours PT/INR No results found for this basename: LABPROT:2,INR:2 in the last 72 hours CMP     Component Value Date/Time   NA 138 07/25/2012 1417   K 4.3 07/25/2012 1417   CL 101 07/25/2012 1417   CO2 26 07/25/2012 1417   GLUCOSE 100* 07/25/2012 1417   BUN 13 07/25/2012 1417   CREATININE 0.65 07/25/2012 1417   CREATININE 0.75 09/22/2011 1704   CALCIUM 9.6 07/25/2012 1417   PROT 5.0* 06/21/2012 0530   ALBUMIN 1.9* 06/21/2012 0530   AST 24 06/21/2012 0530   ALT 8 06/21/2012 0530   ALKPHOS 92 06/21/2012 0530   BILITOT 0.1* 06/21/2012 0530   GFRNONAA >90 07/25/2012 1417   GFRAA >90 07/25/2012 1417   Lipase     Component Value Date/Time   LIPASE 20 07/22/2012 1503       Studies/Results: No results found.  Anti-infectives: Anti-infectives    None       Assessment/Plan  1.  S/P Laparoscopic Appendectomy: doing well, may resume regular activity without restrictions, Pt will follow up with [place] PRN and knows to call with questions or concerns.     Michelle [name] 08/20/2012

## 2012-08-21 ENCOUNTER — Ambulatory Visit: Payer: Medicaid Other | Admitting: Sports Medicine

## 2012-09-09 ENCOUNTER — Telehealth: Payer: Self-pay | Admitting: Sports Medicine

## 2012-09-09 NOTE — Telephone Encounter (Signed)
Is asking to speak to nurse about breast feeding.

## 2012-09-09 NOTE — Telephone Encounter (Signed)
Spoke with patient and she is wanting Dr. Berline Chough to  get her set up for S and S feeding tube to use with breast feeding. States her baby is 67 months old and she has only been able to pump about 5 ml at any one time. Baby has been on formula and now she wants to try to stimulate milk production. I called lactation nurse at [facility] and she has spoken with patient and she needs to call to schedule appointment there to discuss this and get set up. Patient notified.

## 2012-09-12 ENCOUNTER — Ambulatory Visit (HOSPITAL_COMMUNITY)
Admission: RE | Admit: 2012-09-12 | Discharge: 2012-09-12 | Disposition: A | Discharge status: Home / Self Care | Payer: Medicaid Other | Source: Ambulatory Visit | Attending: Sports Medicine | Admitting: Sports Medicine

## 2012-09-12 NOTE — Lactation Note (Signed)
Adult Lactation Consultation Outpatient Visit Note  Patient Name: Michelle [name] Date of Birth: 11-Sep-1990 Gestational Age at Delivery: Unknown Type of Delivery:   Breastfeeding History: Frequency of Breastfeeding: Mom has been bottle feeding but wants to go back to breast feeding. Got pump from [facility] yesterday. Baby was in NICU for 6 [name]. Mom had seizure after delivery. Mom requesting assistance with SNS.   Length of Feeding:  Voids: QS Stools: QS  Supplementing / Method: Pumping:  Type of Pump: [employer] Medela Lactina   Frequency: power pumped 2x/yesterday- has not pumped yet today.  Volume:  Few ml  Comments:    Consultation Evaluation:  Initial Feeding Assessment: Pre-feed Weight: 8- 6.5  3812g Post-feed Weight:8- 7.5  3840g Amount Transferred:28 cc's Comments: Baby latched easily to left breast and nursed well with the SNS. Took about 30 cc's from the SNS. Lots of swallows noted.Mom reports no pain with nursing.  Additional Feeding Assessment: Pre-feed Weight: 8- 7.5  3840g Post-feed Weight:8- 7.5  3840g Amount Transferred:0 Comments: Baby would not latch to right breast. Got very fussy and was arching away from breast.  Additional Feeding Assessment: Pre-feed Weight: 8- 7.5  3840g Post-feed Weight: 8- 8.6  3874g Amount Transferred:34 cc's Comments:Baby back to left breast. Latched easily and took the rest of the formula. Then off to sleep.   Total Breast milk Transferred this Visit: 2 cc's Total Supplement Given:60 cc's by SNS  Additional Interventions: Reviewed set up and cleaning of SNS with both parents.They verbalize understanding.Reviewed the importance of regular pumping q 3 hours- 8 times/ day with 2-3 sessions of power pumping per day. Discussed the probability that she would not be able to make enough milk to completely feed baby breast milk due to very little/ no pumping over the last few [name]. No further questions at present. To call prn   Follow-Up To  see Ped tomorrow     Pamelia Hoit 09/12/2012, 3:22 PM

## 2012-09-16 ENCOUNTER — Telehealth: Payer: Self-pay | Admitting: Sports Medicine

## 2012-09-16 NOTE — Telephone Encounter (Signed)
Patient is calling asking if Dr. Berline Chough will call in a Rx for All Purpose Nipple Cream to [facility].

## 2012-09-19 MED ORDER — NONFORMULARY OR COMPOUNDED ITEM: Status: DC

## 2012-09-19 NOTE — Telephone Encounter (Signed)
Compounding rx sent in for all purpose nipple cream

## 2012-09-19 NOTE — Addendum Note (Signed)
Addended by: Gaspar Bidding D on: 09/19/2012 04:18 PM   Modules accepted: Orders

## 2012-09-25 ENCOUNTER — Telehealth: Payer: Self-pay | Admitting: Sports Medicine

## 2012-09-25 NOTE — Telephone Encounter (Signed)
Patient is calling wanting to speak to someone about breast feeding.

## 2012-09-25 NOTE — Telephone Encounter (Signed)
Left message on voicemail requested rtn call if still has questions. Michelle [name], Virgel Bouquet

## 2012-09-26 ENCOUNTER — Ambulatory Visit (HOSPITAL_COMMUNITY)
Admission: RE | Admit: 2012-09-26 | Discharge: 2012-09-26 | Disposition: A | Discharge status: Home / Self Care | Payer: Medicaid Other | Source: Ambulatory Visit | Attending: Sports Medicine | Admitting: Sports Medicine

## 2012-09-26 ENCOUNTER — Ambulatory Visit (HOSPITAL_COMMUNITY): Admission: RE | Admit: 2012-09-26 | Payer: Medicaid Other | Source: Ambulatory Visit

## 2012-09-27 ENCOUNTER — Ambulatory Visit (HOSPITAL_COMMUNITY): Admission: RE | Admit: 2012-09-27 | Payer: Medicaid Other | Source: Ambulatory Visit

## 2012-10-02 ENCOUNTER — Ambulatory Visit (INDEPENDENT_AMBULATORY_CARE_PROVIDER_SITE_OTHER): Payer: Medicaid Other | Admitting: Sports Medicine

## 2012-10-02 ENCOUNTER — Encounter: Payer: Self-pay | Admitting: Sports Medicine

## 2012-10-02 VITALS — BP 115/76 | HR 105 | Temp 99.2°F | Ht 62.5 in | Wt 159.0 lb

## 2012-10-02 DIAGNOSIS — M26609 Unspecified temporomandibular joint disorder, unspecified side: Secondary | ICD-10-CM

## 2012-10-02 DIAGNOSIS — F17200 Nicotine dependence, unspecified, uncomplicated: Secondary | ICD-10-CM

## 2012-10-02 DIAGNOSIS — H5340 Unspecified visual field defects: Secondary | ICD-10-CM

## 2012-10-02 DIAGNOSIS — IMO0001 Reserved for inherently not codable concepts without codable children: Secondary | ICD-10-CM

## 2012-10-02 DIAGNOSIS — Z309 Encounter for contraceptive management, unspecified: Secondary | ICD-10-CM

## 2012-10-02 NOTE — Patient Instructions (Signed)
It was good to see you.  Please schedule an appointment to have a Nexplanon inserted by Dr. Ashley Royalty  I will look into the Oxytocin Spray and be in touch.  Please follow up with your TMJ and Ophthalmology visits

## 2012-10-07 ENCOUNTER — Encounter: Payer: Self-pay | Admitting: Family Medicine

## 2012-10-07 ENCOUNTER — Ambulatory Visit (INDEPENDENT_AMBULATORY_CARE_PROVIDER_SITE_OTHER): Payer: Medicaid Other | Admitting: Family Medicine

## 2012-10-07 VITALS — BP 114/68 | HR 97 | Temp 98.2°F | Ht 62.0 in | Wt 162.0 lb

## 2012-10-07 DIAGNOSIS — Z3046 Encounter for surveillance of implantable subdermal contraceptive: Secondary | ICD-10-CM

## 2012-10-07 DIAGNOSIS — Z30017 Encounter for initial prescription of implantable subdermal contraceptive: Secondary | ICD-10-CM

## 2012-10-07 DIAGNOSIS — IMO0001 Reserved for inherently not codable concepts without codable children: Secondary | ICD-10-CM | POA: Insufficient documentation

## 2012-10-07 DIAGNOSIS — Z3009 Encounter for other general counseling and advice on contraception: Secondary | ICD-10-CM

## 2012-10-07 LAB — POCT URINE PREGNANCY: Preg Test, Ur: NEGATIVE

## 2012-10-07 MED ORDER — ETONOGESTREL 68 MG ~~LOC~~ IMPL
68.0000 mg | DRUG_IMPLANT | Freq: Once | SUBCUTANEOUS | Status: AC
  Administered 2012-10-07: 68 mg via SUBCUTANEOUS

## 2012-10-07 NOTE — Progress Notes (Signed)
Patient ID: SARAHBETH CASHIN, female   DOB: [birth year], 22 y.o.   MRN: [id number]  Desires contraception. Discussed alternatives, risks, benefits including hormonal side effects, strong likelihood of bleeding irregularities. Patient understands and all questions answered.  PROCEDURE NOTE: NEXPLANON  INSERTION Patient given informed consent and signed copy in the chart.  Pregnancy test was negative. Appropriate time out was taken. left arm was prepped and draped in the usual sterile fashion. Appropriate measurement was made for insertion of nexplanon and landmarks identified, insertion site marked. Two cc of 1%lidocaine w/epi was used for local anesthesia. Once anesthesia obtained, nexplanon was inserted in typical fashion. No complications. Pressure bandage applied to decrease bruising. Patient given follow up instructions should she experience redness, swelling at sight or fever in the next 24 hours. Patient given Nexplanon pocket card.  Advised to use back up protection for 14 days.

## 2012-10-07 NOTE — Assessment & Plan Note (Signed)
Has follow up with ENT.

## 2012-10-07 NOTE — Progress Notes (Signed)
  [facility]  Patient name: Michelle [name] MRN [id number]  Date of birth: 05-May-1991  CC & HPI:  Michelle [name] is a 22 y.o. female presenting today for follow up of:  # Lactation Concerns: Following up with Lactation consultants at [facility].  Interested in Oxytocin Spray due to lack of let down.  # Contraception:  Wishes for Nexplanon  # OB Follow up:  Still having residual effects from Eclamptic episode.  Has follow up with ENT for TMJ issues that Ophthalmology would like to have her address prior to seeing her again for her vision loss  ROS:  Headaches improved. No LOC, syncope,  No vaginal bleeding No abdominal pain  Pertinent History Reviewed:  Medical & Surgical Hx:  Reviewed: Significant for Eclamptic seizure with most recent post partum visit and menorrhagia prior to pregnancy.  Has tried IUD previously Medications: Reviewed & Updated - see associated section Social History: Reviewed -  reports that she has quit smoking. Her smoking use included Cigarettes. She has a 3.5 pack-year smoking history. She has never used smokeless tobacco.  Objective Findings:  Vitals: BP 115/76  Pulse 105  Temp(Src) 99.2 F (37.3 C) (Oral)  Ht 5' 2.5" (1.588 m)  Wt 159 lb (72.122 kg)  BMI 28.6 kg/m2  SpO2 99%  PE: GENERAL:  Adult Caucasian  female. In no discomfort; no respiratory distress. PSYCH: Alert and appropriately interactive; Insight:Good   H&N: AT/[place], trachea midline EENT:  MMM, no scleral icterus, EOMi HEART: RRR, S1/S2 heard, no murmur LUNGS: CTA B, no wheezes, no crackles EXTREMITIES: Moves all 4 extremities spontaneously, warm well perfused, no edema, bilateral DP and PT pulses 2/4.    Assessment & Plan:

## 2012-10-07 NOTE — Patient Instructions (Addendum)
Use back up method for 14 days. Pressure if bleeding restarts. Etonogestrel implant What is this medicine? ETONOGESTREL is a contraceptive (birth control) device. It is used to prevent pregnancy. It can be used for up to 3 years. This medicine may be used for other purposes; ask your health care provider or pharmacist if you have questions. What should I tell my health care provider before I take this medicine? They need to know if you have any of these conditions: -abnormal vaginal bleeding -blood vessel disease or blood clots -cancer of the breast, cervix, or liver -depression -diabetes -gallbladder disease -headaches -heart disease or recent heart attack -high blood pressure -high cholesterol -kidney disease -liver disease -renal disease -seizures -tobacco smoker -an unusual or allergic reaction to etonogestrel, other hormones, anesthetics or antiseptics, medicines, foods, dyes, or preservatives -pregnant or trying to get pregnant -breast-feeding How should I use this medicine? This device is inserted just under the skin on the inner side of your upper arm by a health care professional. Talk to your pediatrician regarding the use of this medicine in children. Special care may be needed. Overdosage: If you think you've taken too much of this medicine contact a poison control center or emergency room at once. Overdosage: If you think you have taken too much of this medicine contact a poison control center or emergency room at once. NOTE: This medicine is only for you. Do not share this medicine with others. What if I miss a dose? This does not apply. What may interact with this medicine? Do not take this medicine with any of the following medications: -amprenavir -bosentan -fosamprenavir This medicine may also interact with the following medications: -barbiturate medicines for inducing sleep or treating seizures -certain medicines for fungal infections like ketoconazole and  itraconazole -griseofulvin -medicines to treat seizures like carbamazepine, felbamate, oxcarbazepine, phenytoin, topiramate -modafinil -phenylbutazone -rifampin -some medicines to treat HIV infection like atazanavir, indinavir, lopinavir, nelfinavir, tipranavir, ritonavir -St. John's wort This list may not describe all possible interactions. Give your health care provider a list of all the medicines, herbs, non-prescription drugs, or dietary supplements you use. Also tell them if you smoke, drink alcohol, or use illegal drugs. Some items may interact with your medicine. What should I watch for while using this medicine? This product does not protect you against HIV infection (AIDS) or other sexually transmitted diseases. You should be able to feel the implant by pressing your fingertips over the skin where it was inserted. Tell your doctor if you cannot feel the implant. What side effects may I notice from receiving this medicine? Side effects that you should report to your doctor or health care professional as soon as possible: -allergic reactions like skin rash, itching or hives, swelling of the face, lips, or tongue -breast lumps -changes in vision -confusion, trouble speaking or understanding -dark urine -depressed mood -general ill feeling or flu-like symptoms -light-colored stools -loss of appetite, nausea -right upper belly pain -severe headaches -severe pain, swelling, or tenderness in the abdomen -shortness of breath, chest pain, swelling in a leg -signs of pregnancy -sudden numbness or weakness of the face, arm or leg -trouble walking, dizziness, loss of balance or coordination -unusual vaginal bleeding, discharge -unusually weak or tired -yellowing of the eyes or skin Side effects that usually do not require medical attention (Report these to your doctor or health care professional if they continue or are bothersome.): -acne -breast pain -changes in  weight -cough -fever or chills -headache -irregular menstrual bleeding -itching, burning,  and vaginal discharge -pain or difficulty passing urine -sore throat This list may not describe all possible side effects. Call your doctor for medical advice about side effects. You may report side effects to FDA at 1-800-FDA-1088. Where should I keep my medicine? This drug is given in a hospital or clinic and will not be stored at home. NOTE: This sheet is a summary. It may not cover all possible information. If you have questions about this medicine, talk to your doctor, pharmacist, or health care provider.  2013, [employer]. (03/26/2009 3:54:17 PM)

## 2012-10-07 NOTE — Assessment & Plan Note (Signed)
Interested in [place]. To follow up with provider in [facility]

## 2012-10-07 NOTE — Assessment & Plan Note (Signed)
Has follow up with Ophthal

## 2012-10-15 ENCOUNTER — Ambulatory Visit (HOSPITAL_COMMUNITY)
Admission: RE | Admit: 2012-10-15 | Discharge: 2012-10-15 | Disposition: A | Discharge status: Home / Self Care | Payer: Medicaid Other | Source: Ambulatory Visit | Attending: Sports Medicine | Admitting: Sports Medicine

## 2012-10-16 ENCOUNTER — Ambulatory Visit (HOSPITAL_COMMUNITY)
Admission: RE | Admit: 2012-10-16 | Discharge: 2012-10-16 | Disposition: A | Discharge status: Home / Self Care | Payer: Medicaid Other | Source: Ambulatory Visit | Attending: Sports Medicine | Admitting: Sports Medicine

## 2012-10-16 NOTE — Lactation Note (Signed)
Infant Lactation Consultation Outpatient Visit Note  Patient Name: Michelle [name] Date of Birth: 03-30-1991                            DOB [birth year] Birth Weight:                                              Birth weight 3-6     Today's weight 10-5 Gestational Age at Delivery: Gestational Age: <None>    32           Today 4 months (2 corrected gestation) Type of Delivery: c-section  Breastfeeding History Frequency of Breastfeeding: 4 times a day with SNS, taking 120 mls Neosure 24 cals, and 6-7 times at breast for 10 minutes, without SNS Length of Feeding: 30 min w SNS, 10 min without Voids: 6-7 /day Stools: 1 every 1-2 days  Supplementing / Method: Pumping:  Type of Pump:mom has a Lactina DEP, from [facility]. She said it "does not work for her", so she hand expresses about 6 times a day    Frequency:6 times a day  Volume:   15-30 mls  Comments:  On exam, mom's breast are very pendulous  and soft, but does have some milk with hand expression    Consultation Evaluation:    Follow up lactation consult with this moma nd now 38 month old infant (2 months corrected gestation). I worked with this mom in the NICU, when the baby was born, and I did not find her very compliant with pumping, during  the time the baby was in the NICU, although she reports she was. She has been tring to relactate for a few weeks now, and started domperidone 10 mg 3 times a day, 1 week ago. She reports  she has been able to hand express 15 - 30 mls about 6 times a day, and has been breast feeding Michelle [name] about 6 times a day, for 10 minutes(without SNS.) He also gets 4 feeds a day at the breast with SNS, at which he takes at least 120 mls of Neosure 24 calorie  formula. Michelle [name] appears well nourished and happy. He latches easily to the breast, but mom often continue with the SNS, 4 times a day , as per her pediatrician(as per mom), and to continue putting Michelle [name] to the breast, and pumping/hand expressing . Mom was  going to see if she was able to increase her domperidone.  Initial Feeding Assessment: Pre-feed [id number] Post-feed EXBMWU:1324 Amount Transferred:10 Comments:baby pulls at the breast a lot - may be trying to increase flow (used to faster flow with SNS)   Michelle [name] does a lot of fast sucking at the breast, but little slow suck and swallow, which goes along with the small amount he was able to transfer Additional Feeding Assessment: Pre-feed [id number] Post-feed UYQIHK:7425 Amount Transferred:4 Comments:on same breast  Additional Feeding Assessment: Pre-feed [id number]:5643 Post-feed PIRJJO:8416 Amount Transferred:6 Comments: on second breast  Total Breast milk Transferred this Visit: 20 Total Supplement Given: offered SNS with Neosure 24 cal formula, but baby fell asleep at breast after taking a very small amount of supplement  Additional Interventions:  I gave mom a new SNS - I instructed mom to sterilize her SNS  parts daily. i also gave mom a Medela hand pump, which she felt worked well for her   Follow-Up  Mom will call for follow up consult      Alfred Levins 10/16/2012, 5:08 PM

## 2012-10-28 ENCOUNTER — Ambulatory Visit: Payer: Medicaid Other | Admitting: Sports Medicine

## 2013-02-14 ENCOUNTER — Ambulatory Visit (INDEPENDENT_AMBULATORY_CARE_PROVIDER_SITE_OTHER): Payer: Medicaid Other | Admitting: Sports Medicine

## 2013-02-14 ENCOUNTER — Encounter: Payer: Self-pay | Admitting: Sports Medicine

## 2013-02-14 VITALS — BP 111/56 | HR 104 | Temp 98.6°F | Ht 62.0 in | Wt 164.7 lb

## 2013-02-14 DIAGNOSIS — N912 Amenorrhea, unspecified: Secondary | ICD-10-CM

## 2013-02-14 DIAGNOSIS — R300 Dysuria: Secondary | ICD-10-CM

## 2013-02-14 DIAGNOSIS — H8110 Benign paroxysmal vertigo, unspecified ear: Secondary | ICD-10-CM

## 2013-02-14 LAB — POCT URINALYSIS DIPSTICK
Bilirubin, UA: NEGATIVE
Blood, UA: NEGATIVE
Glucose, UA: NEGATIVE
Ketones, UA: NEGATIVE
Nitrite, UA: POSITIVE
Spec Grav, UA: 1.015
Urobilinogen, UA: 0.2
pH, UA: 7

## 2013-02-14 LAB — POCT UA - MICROSCOPIC ONLY

## 2013-02-14 LAB — POCT URINE PREGNANCY: Preg Test, Ur: NEGATIVE

## 2013-02-14 MED ORDER — ALBUTEROL SULFATE HFA 108 (90 BASE) MCG/ACT IN AERS: 2.0000 | INHALATION_SPRAY | RESPIRATORY_TRACT | Status: DC | PRN

## 2013-02-14 MED ORDER — MECLIZINE HCL 25 MG PO TABS: 25.0000 mg | ORAL_TABLET | Freq: Three times a day (TID) | ORAL | Status: DC | PRN

## 2013-02-14 NOTE — Patient Instructions (Addendum)
It was nice to see you today.   Today we discussed: Amenorrhea Your Pregnancy test is negative - POCT urine pregnancy  Benign paroxysmal positional vertigo Please take - meclizine (ANTIVERT) 25 MG tablet; Take 1-2 tablets (25-50 mg total) by mouth 3 (three) times daily as needed.  Dispense: 30 tablet; Refill: 0   Please plan to return to see me as needed.  If you need anything prior to seeing me please call the clinic.  Please Bring all medications with you to each appointment.   Vertigo Vertigo means you feel like you or your surroundings are moving when they are not. Vertigo can be dangerous if it occurs when you are at work, driving, or performing difficult activities.  CAUSES  Vertigo occurs when there is a conflict of signals sent to your brain from the visual and sensory systems in your body. There are many different causes of vertigo, including:  Infections, especially in the inner ear.  A bad reaction to a drug or misuse of alcohol and medicines.  Withdrawal from drugs or alcohol.  Rapidly changing positions, such as lying down or rolling over in bed.  A migraine headache.  Decreased blood flow to the brain.  Increased pressure in the brain from a head injury, infection, tumor, or bleeding. SYMPTOMS  You may feel as though the world is spinning around or you are falling to the ground. Because your balance is upset, vertigo can cause nausea and vomiting. You may have involuntary eye movements (nystagmus). DIAGNOSIS  Vertigo is usually diagnosed by physical exam. If the cause of your vertigo is unknown, your caregiver may perform imaging tests, such as an MRI scan (magnetic resonance imaging). TREATMENT  Most cases of vertigo resolve on their own, without treatment. Depending on the cause, your caregiver may prescribe certain medicines. If your vertigo is related to body position issues, your caregiver may recommend movements or procedures to correct the problem. In rare  cases, if your vertigo is caused by certain inner ear problems, you may need surgery. HOME CARE INSTRUCTIONS   Follow your caregiver's instructions.  Avoid driving.  Avoid operating heavy machinery.  Avoid performing any tasks that would be dangerous to you or others during a vertigo episode.  Tell your caregiver if you notice that certain medicines seem to be causing your vertigo. Some of the medicines used to treat vertigo episodes can actually make them worse in some people. SEEK IMMEDIATE MEDICAL CARE IF:   Your medicines do not relieve your vertigo or are making it worse.  You develop problems with talking, walking, weakness, or using your arms, hands, or legs.  You develop severe headaches.  Your nausea or vomiting continues or gets worse.  You develop visual changes.  A family member notices behavioral changes.  Your condition gets worse. MAKE SURE YOU:  Understand these instructions.  Will watch your condition.  Will get help right away if you are not doing well or get worse. Document Released: 04/12/2005 Document Revised: 09/25/2011 Document Reviewed: 01/19/2011 [facility] Patient Information 2014 [place], [place].

## 2013-02-14 NOTE — Progress Notes (Signed)
  [facility]  Patient name: Michelle [name] MRN [id number]  Date of birth: 1990/10/08  CC & HPI:  Michelle [name] is a 22 y.o. female presenting today for:  # Dizziness  Character & Context:  patient reports a 10 day history of intermittent dizziness and lightheadedness.  This can occur multiple times throughout the day.  This has happened while she's been walking as well as while she's been sitting.  She's had no syncopal episodes. Reports generalized dizziness without specific vertiginous component.   No prior episodes   Associated Symptoms:  Reports some associated hearing changes with the symptoms described as a "whooshing" but is inconsistent with symptoms.  Effective Therapies:  nothing tried   # Amenorrhea  Character & Context:  patient has not had a period since the insertion of her Nexplanon.  She is concerned she was under the impression that she would have normal menstrual periods.  She sutures may be pregnant.  She's having no other side effects, no abdominal cramping, no other concerns with the Nexplanon    ROS:  PER HPI  Pertinent History Reviewed:  Medical & Surgical Hx:  Reviewed: Significant for recent delivery with eclamptic seizure Medications: Reviewed & Updated - see associated section Social History: Reviewed -  reports that she has quit smoking. Her smoking use included Cigarettes. She has a 3.5 pack-year smoking history. She has never used smokeless tobacco.  Objective Findings:  Vitals: BP 111/56  Pulse 104  Temp(Src) 98.6 F (37 C) (Oral)  Ht 5\' 2"  (1.575 m)  Wt 164 lb 11.2 oz (74.707 kg)  BMI 30.12 kg/m2  Breastfeeding? No  PE: GENERAL:  adult Caucasian female. In no discomfort; no respiratory distress  PSYCH:  alert and appropriate, good insight   HNEENT:   cranial nerves II through XI intact, S1 there's no nystagmus, Moist mucous membranes, there are no middle ear effusions, Normal whisper tests   CARDIO:   RRR, S1/S2 heard, no  murmur  LUNGS:  CTA B, no wheezes, no crackles  ABDOMEN:    EXTREM:   GU:   SKIN:   NEUROMSK: Symptoms are elicited with Dix-Hallpike maneuver to the right.  I did not appreciate a nystagmus Robert alternating movements and your nose finger are normal . Negative Romberg      Assessment & Plan:

## 2013-02-14 NOTE — Assessment & Plan Note (Signed)
Sx ilicited by Dix-Hallpike - no nystagmus Rx for Meclizine Return if no improvement >consider further workup for Meniere's if hearing involvement more specific

## 2013-02-16 LAB — URINE CULTURE: Colony Count: >=100000

## 2013-03-31 ENCOUNTER — Ambulatory Visit: Payer: Medicaid Other | Admitting: Sports Medicine

## 2013-04-19 ENCOUNTER — Emergency Department (HOSPITAL_COMMUNITY)
Admission: EM | Admit: 2013-04-19 | Discharge: 2013-04-19 | Disposition: A | Discharge status: Home / Self Care | Payer: Medicaid Other | Attending: Emergency Medicine | Admitting: Emergency Medicine

## 2013-04-19 ENCOUNTER — Encounter (HOSPITAL_COMMUNITY): Payer: Self-pay | Admitting: Nurse Practitioner

## 2013-04-19 DIAGNOSIS — R109 Unspecified abdominal pain: Secondary | ICD-10-CM | POA: Insufficient documentation

## 2013-04-19 DIAGNOSIS — R110 Nausea: Secondary | ICD-10-CM | POA: Insufficient documentation

## 2013-04-19 DIAGNOSIS — Z79899 Other long term (current) drug therapy: Secondary | ICD-10-CM | POA: Insufficient documentation

## 2013-04-19 DIAGNOSIS — W57XXXA Bitten or stung by nonvenomous insect and other nonvenomous arthropods, initial encounter: Secondary | ICD-10-CM | POA: Insufficient documentation

## 2013-04-19 DIAGNOSIS — F319 Bipolar disorder, unspecified: Secondary | ICD-10-CM | POA: Insufficient documentation

## 2013-04-19 DIAGNOSIS — Y9389 Activity, other specified: Secondary | ICD-10-CM | POA: Insufficient documentation

## 2013-04-19 DIAGNOSIS — S60469A Insect bite (nonvenomous) of unspecified finger, initial encounter: Secondary | ICD-10-CM | POA: Insufficient documentation

## 2013-04-19 DIAGNOSIS — F4310 Post-traumatic stress disorder, unspecified: Secondary | ICD-10-CM | POA: Insufficient documentation

## 2013-04-19 DIAGNOSIS — Y929 Unspecified place or not applicable: Secondary | ICD-10-CM | POA: Insufficient documentation

## 2013-04-19 DIAGNOSIS — Z87891 Personal history of nicotine dependence: Secondary | ICD-10-CM | POA: Insufficient documentation

## 2013-04-19 NOTE — ED Notes (Addendum)
Pt bit by a spider on her L 5th digit. Redness and pain at site. Has spider alive in a bag with her. Cms intact.

## 2013-04-19 NOTE — ED Provider Notes (Signed)
CSN: [id number]     Arrival date & time 04/19/13  1010 History  This chart was scribed for non-physician practitioner, Trixie Dredge, PA-C working with Shanna Cisco, MD by Greggory Stallion, ED scribe. This patient was seen in room TR05C/TR05C and the patient's care was started at 10:45 AM.   Chief Complaint  Patient presents with  . Insect Bite   The history is provided by the patient. No language interpreter was used.    HPI Comments: Michelle [name] is a 22 y.o. female who presents to the Emergency Department complaining of a spider bite to her left fifth finger that occurred around 7 AM this morning. Pt states she saw a spider on her hand and about an hour later her finger started itching and burning. She states she captured the spider. Pt states she has nausea. She denies fever, chills, abdominal pain, muscle spasms, body aches, emesis, numbness, tingling and weakness.   Past Medical History  Diagnosis Date  . Tobacco abuse   . Menorrhagia   . 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 H/O eclampsia     "son was born @ 33wk because of it" (07/25/2012)  . Seizures 06/20/2012    "had one when they pulled baby out via C-section" (07/25/2012)  . Gestational hypertension     "back to normalish 2 wk after son born" (07/25/2012)  . Gestational diabetes 2007; 2008  . Migraines   . Bipolar I disorder   . PTSD (post-traumatic stress disorder)     "started back when I was 22 yr old" (07/25/2012)  . Bipolar disorder    Past Surgical History  Procedure Laterality Date  . Cesarean section  06/20/2012    Procedure: CESAREAN SECTION;  Surgeon: Allie Bossier, MD;  Location: [facility];  Service: Obstetrics;  Laterality: N/A;  . Appendectomy  07/25/2012  . Adenoidectomy, tonsillectomy and myringotomy with tube placement  1990's  . Laparoscopic appendectomy  07/25/2012    Procedure: APPENDECTOMY LAPAROSCOPIC;  Surgeon: Liz Malady, MD;  Location: [facility] OR;  Service: General;  Laterality: N/A;    Family History  Problem Relation Age of Onset  . Breast cancer Paternal Grandmother   . Colon cancer Paternal Grandfather   . Colon cancer Maternal Grandfather   . Colon polyps Maternal Aunt   . Diabetes Mother     maternal aunts and uncles  . Diabetes Sister   . Diabetes Father   . Heart disease Father    History  Substance Use Topics  . Smoking status: Former Smoker -- 0.25 packs/day for 14 years    Types: Cigarettes  . Smokeless tobacco: Never Used     Comment: 07/25/2012 "I'm on Chantix; tomorrow will be my first day of no cigarettes"  . Alcohol Use: No     Comment: 07/25/2012 "New Year's or other occasions drinker"   OB History   Grav Para Term Preterm Abortions TAB SAB Ect Mult Living   4 3 2 1      3      Obstetric Comments   C-Section Indication: Non-reassuring fetal tracing, growth delay & marked proteinuria & Pre-Eclampsia Eclamptic Seizure during C-section delivery     Review of Systems  Constitutional: Negative for fever and chills.  Gastrointestinal: Positive for nausea and abdominal pain. Negative for vomiting.  Musculoskeletal: Positive for arthralgias (left fifth finger).  Neurological: Negative for weakness and numbness.  All other systems reviewed and are negative.    Allergies  Codeine  Home Medications   Current Outpatient Rx  Name  Route  Sig  Dispense  Refill  . albuterol (PROVENTIL HFA;VENTOLIN HFA) 108 (90 BASE) MCG/ACT inhaler   Inhalation   Inhale 2 puffs into the lungs every 4 (four) hours as needed. For wheezing   1 Inhaler   0   . amoxicillin-clavulanate (AUGMENTIN) 875-125 MG per tablet   Oral   Take 1 tablet by mouth 2 (two) times daily.   20 tablet   0   . meclizine (ANTIVERT) 25 MG tablet   Oral   Take 1-2 tablets (25-50 mg total) by mouth 3 (three) times daily as needed.   30 tablet   0   . NONFORMULARY OR COMPOUNDED ITEM      All Purpose Nipple Cream - Apply 4-6 times per day for dry, cracked, painful nipples due to  breast feeding.   1 each   1   . oxyCODONE-acetaminophen (PERCOCET/ROXICET) 5-325 MG per tablet   Oral   Take 1-2 tablets by mouth every 4 (four) hours as needed.   40 tablet   0   . traMADol (ULTRAM) 50 MG tablet   Oral   Take 1 tablet (50 mg total) by mouth every 8 (eight) hours as needed for pain.   15 tablet   0   . varenicline (CHANTIX CONTINUING MONTH PAK) 1 MG tablet   Oral   Take 1 tablet (1 mg total) by mouth 2 (two) times daily.   60 tablet   1    BP 112/74  Pulse 104  Temp(Src) 98.3 F (36.8 C) (Oral)  Resp 15  Ht 5\' 2"  (1.575 m)  Wt 160 lb (72.576 kg)  BMI 29.26 kg/m2  SpO2 96%  Physical Exam  Nursing note and vitals reviewed. Constitutional: She appears well-developed and well-nourished. No distress.  HENT:  Head: Normocephalic and atraumatic.  Neck: Neck supple.  Pulmonary/Chest: Effort normal.  Neurological: She is alert.  Skin: She is not diaphoretic.  Ulnar aspect of left fifth digit of proximal phalanx has two white papules.  Capillary refill < 2 seconds.  Sensation intact.  Full AROM of digit.  No erythema, edema, warmth, or discharge.     ED Course  Procedures (including critical care time)  DIAGNOSTIC STUDIES: Oxygen Saturation is 96% on RA, normal by my interpretation.    COORDINATION OF CARE: 10:47 AM-Discussed treatment plan which includes researching the spider and treatment with pt at bedside and pt agreed to plan.   Labs Review Labs Reviewed - No data to display Imaging Review No results found.  MDM   1. Spider bite, initial encounter    Patient with spider bite to left 5th finger.  She brought the spider in and it was identified as Scientist, forensic.  It is not a venomous spider.  Pt with mild burning and itching, no significant pain.  Pt d/c home with OTC medication instructions and return precautions.  Discussed findings, treatment, and follow up  with patient.  Pt given return precautions.  Pt verbalizes understanding  and agrees with plan.       I personally performed the services described in this documentation, which was scribed in my presence. The recorded information has been reviewed and is accurate.   Trixie Dredge, PA-C 04/19/13 1124

## 2013-04-19 NOTE — ED Provider Notes (Signed)
Medical screening examination/treatment/procedure(s) were performed by non-physician practitioner and as supervising physician I was immediately available for consultation/collaboration.   Megan E Docherty, MD 04/19/13 1243 

## 2013-04-25 ENCOUNTER — Encounter: Payer: Self-pay | Admitting: Sports Medicine

## 2013-04-25 ENCOUNTER — Ambulatory Visit (INDEPENDENT_AMBULATORY_CARE_PROVIDER_SITE_OTHER): Payer: Medicaid Other | Admitting: Sports Medicine

## 2013-04-25 VITALS — BP 111/75 | HR 109 | Temp 98.7°F | Ht 62.0 in | Wt 164.0 lb

## 2013-04-25 DIAGNOSIS — Z23 Encounter for immunization: Secondary | ICD-10-CM

## 2013-04-25 DIAGNOSIS — F4310 Post-traumatic stress disorder, unspecified: Secondary | ICD-10-CM

## 2013-04-25 DIAGNOSIS — Z299 Encounter for prophylactic measures, unspecified: Secondary | ICD-10-CM

## 2013-04-25 DIAGNOSIS — F319 Bipolar disorder, unspecified: Secondary | ICD-10-CM

## 2013-04-25 MED ORDER — LURASIDONE HCL 20 MG PO TABS: 20.0000 mg | ORAL_TABLET | Freq: Every day | ORAL | Status: DC
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
## 2013-04-25 NOTE — Assessment & Plan Note (Addendum)
-   50% of this 25 minute visit spent in direct patient counseling and/or coordination of care. History of suicide attempt while on Zoloft.  No suicidality currently, no homicidality - Avoid SSRI - Start Latuda for bipolar depression. -Given [facility] psychology clinic information > Follow up with mood disorder clinic > Plan to titrate Latuda in one week
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
## 2013-04-25 NOTE — Patient Instructions (Signed)
   Start Michelle [name] today.  Followup with me in one week  Call to make an appointment in mood disorder clinic with Dr. Pascal Lux & Dr. Eddie Dibbles can schedule an appointment with her by calling her directly at [phone].  Follow up at [facility]. psychology clinic for counseling  Keep up exercising    If you need anything prior to your next visit please call the clinic. Please Bring all medications or accurate medication list with you to each appointment; an accurate medication list is essential in providing you the best care possible.

## 2013-04-28 ENCOUNTER — Telehealth: Payer: Self-pay | Admitting: Psychology

## 2013-04-28 ENCOUNTER — Encounter: Payer: Self-pay | Admitting: Psychology
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
## 2013-04-28 NOTE — Progress Notes (Deleted)
Psychiatric Assessment Adult  Patient Identification:  Michelle [name] Date of Evaluation:  04/28/2013 Chief Complaint: *** History of Chief Complaint:  No chief complaint on file.   HPI Review of Systems Physical Exam  Depressive Symptoms: {DEPRESSION SYMPTOMS:20000}  (Hypo) Manic Symptoms:   Elevated Mood:  {BHH YES OR NO:22294} Irritable Mood:  {BHH YES OR NO:22294} Grandiosity:  {BHH YES OR NO:22294} Distractibility:  {BHH YES OR NO:22294} Labiality of Mood:  {BHH YES OR NO:22294} Delusions:  {BHH YES OR NO:22294} Hallucinations:  {BHH YES OR NO:22294} Impulsivity:  {BHH YES OR NO:22294} Sexually Inappropriate Behavior:  {BHH YES OR NO:22294} Financial Extravagance:  {BHH YES OR NO:22294} Flight of Ideas:  {BHH YES OR NO:22294}  Anxiety Symptoms: Excessive Worry:  {BHH YES OR NO:22294} Panic Symptoms:  {BHH YES OR NO:22294} Agoraphobia:  {BHH YES OR NO:22294} Obsessive Compulsive: {BHH YES OR NO:22294}  Symptoms: {Obsessive Compulsive Symptoms:22671} Specific Phobias:  {BHH YES OR NO:22294} Social Anxiety:  {BHH YES OR NO:22294}  Psychotic Symptoms:  Hallucinations: {BHH YES OR NO:22294} {Hallucinations:22672} Delusions:  {BHH YES OR NO:22294} Paranoia:  {BHH YES OR NO:22294}   Ideas of Reference:  {BHH YES OR NO:22294}  PTSD Symptoms: Ever had a traumatic exposure:  {BHH YES OR NO:22294} Had a traumatic exposure in the last month:  {BHH YES OR NO:22294} Re-experiencing: {BHH YES OR NO:22294} {Re-experiencing:22673} Hypervigilance:  {BHH YES OR NO:22294} Hyperarousal: {BHH YES OR NO:22294} {Hyperarousal:22674} Avoidance: {BHH YES OR NO:22294} {Avoidance:22675}  Traumatic Brain Injury: {BHH YES OR NO:22294} {Traumatic Brain Injury:22676}  Past Psychiatric History: Diagnosis: ***  Hospitalizations: ***  Outpatient Care: ***  Substance Abuse Care: ***  Self-Mutilation: ***  Suicidal Attempts: ***  Violent Behaviors: ***   Past Medical History:   Past  Medical History  Diagnosis Date  . Tobacco abuse   . Menorrhagia   . 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 H/O eclampsia     "son was born @ 33wk because of it" (07/25/2012)  . Seizures 06/20/2012    "had one when they pulled baby out via C-section" (07/25/2012)  . Gestational hypertension     "back to normalish 2 wk after son born" (07/25/2012)  . Gestational diabetes 2007; 2008  . Migraines   . Bipolar I disorder   . PTSD (post-traumatic stress disorder)     "started back when I was 22 yr old" (07/25/2012)  . Bipolar disorder    History of Loss of Consciousness:  {BHH YES OR NO:22294} Seizure History:  {BHH YES OR NO:22294} Cardiac History:  {BHH YES OR NO:22294} Allergies:   Allergies  Allergen Reactions  . Codeine Anaphylaxis and Hives    Rxn occurred when pt was 22 yr old.  Not sure of details.  Has taken percocet and tolerated it in the past.   Current Medications:  Current Outpatient Prescriptions  Medication Sig Dispense Refill  . Lurasidone HCl (LATUDA) 20 MG TABS Take 1 tablet (20 mg total) by mouth daily with breakfast.  30 tablet  0   No current facility-administered medications for this visit.    Previous Psychotropic Medications:  Medication Dose   ***  ***                     Substance Abuse History in the last 12 months: Substance Age of 1st Use Last Use Amount Specific Type  Nicotine  ***  ***  ***  ***  Alcohol  ***  ***  ***  ***  Cannabis  ***  ***  ***  ***  Opiates  ***  ***  ***  ***  Cocaine  ***  ***  ***  ***  Methamphetamines  ***  ***  ***  ***  LSD  ***  ***  ***  ***  Ecstasy  ***   ***  ***  ***  Benzodiazepines  ***  ***  ***  ***  Caffeine  ***  ***  ***  ***  Inhalants  ***  ***  ***  ***  Others:                          Medical Consequences of Substance Abuse: ***  Legal Consequences of Substance Abuse: ***  Family Consequences of Substance Abuse: ***  Blackouts:  {BHH YES OR NO:22294} DT's:  {BHH YES  OR NO:22294} Withdrawal Symptoms:  {BHH YES OR NO:22294} {Withdrawal Symptoms:22677}  Social History: Current Place of Residence: *** Place of Birth: *** Family Members: *** Marital Status:  {Marital Status:22678} Children: ***  Sons: ***  Daughters: *** Relationships: *** Education:  {Education:22679} Educational Problems/Performance: *** Religious Beliefs/Practices: *** History of Abuse: {Desc; abuse:16542} Occupational Experiences; Military History:  {Military History:22680} Legal History: *** Hobbies/Interests: ***  Family History:   Family History  Problem Relation Age of Onset  . Breast cancer Paternal Grandmother   . Colon cancer Paternal Grandfather   . Colon cancer Maternal Grandfather   . Colon polyps Maternal Aunt   . Diabetes Mother     maternal aunts and uncles  . Diabetes Sister   . Diabetes Father   . Heart disease Father     Mental Status Examination/Evaluation: Objective:  Appearance: {Appearance:22683}  Eye Contact::  {BHH EYE CONTACT:22684}  Speech:  {Speech:22685}  Volume:  {Volume (PAA):22686}  Mood:  ***  Affect:  {Affect (PAA):22687}  Thought Process:  {Thought Process (PAA):22688}  Orientation:  {BHH ORIENTATION (PAA):22689}  Thought Content:  {Thought Content:22690}  Suicidal Thoughts:  {ST/HT (PAA):22692}  Homicidal Thoughts:  {ST/HT (PAA):22692}  Judgement:  {Judgement (PAA):22694}  Insight:  {Insight (PAA):22695}  Psychomotor Activity:  {Psychomotor (PAA):22696}  Akathisia:  {BHH YES OR NO:22294}  Handed:  {Handed:22697}  AIMS (if indicated):  ***  Assets:  {Assets (PAA):22698}    Laboratory/X-Ray Psychological Evaluation(s)   ***  ***   Assessment:  {axis diagnosis:3049000}  AXIS I {psych axis 1:31909}  AXIS II {psych axis 2:31910}  AXIS III Past Medical History  Diagnosis Date  . Tobacco abuse   . Menorrhagia   . 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 H/O eclampsia     "son was born @ 33wk because of  it" (07/25/2012)  . Seizures 06/20/2012    "had one when they pulled baby out via C-section" (07/25/2012)  . Gestational hypertension     "back to normalish 2 wk after son born" (07/25/2012)  . Gestational diabetes 2007; 2008  . Migraines   . Bipolar I disorder   . PTSD (post-traumatic stress disorder)     "started back when I was 22 yr old" (07/25/2012)  . Bipolar disorder      AXIS IV {psych axis iv:31915}  AXIS V {psych axis v score:31919}   Treatment Plan/Recommendations:  Plan of Care: ***  Laboratory:  {Laboratory:22682}  Psychotherapy: ***  Medications: ***  Routine PRN Medications:  {BHH YES OR NO:22294}  Consultations: ***  Safety Concerns:  ***  Other:  Spero Geralds, PsyD 10/13/20144:01 PM

## 2013-04-28 NOTE — Telephone Encounter (Signed)
Michelle [name] called to schedule an [facility] appointment.  Recently started on Latuda for Bipolar Disorder.  Stated that she is very sleepy on the medicine and is falling asleep mid day which is problematic because she is with her 31 month old.  She wondered if she could take the medicine at night.  Discussed need to take with food as being the main thing.  Can take at night with a meal to see if that makes a difference.  She follows with Dr. Berline Chough on Wednesday.  [facility] appointment November 5th at 11:30.  I told her the following: -  If she is unable to make the appointment she needs to call me. -  If she misses the appointment without a phone call, I won't be able to schedule her back in my clinic. She voiced an understanding and was able to repeat the appointment date and time back to me.

## 2013-04-29 NOTE — Telephone Encounter (Signed)
Agree with change to qhs dosing.  Will see her back tomorrow

## 2013-04-30 ENCOUNTER — Ambulatory Visit (INDEPENDENT_AMBULATORY_CARE_PROVIDER_SITE_OTHER): Payer: Medicaid Other | Admitting: Sports Medicine

## 2013-04-30 ENCOUNTER — Encounter: Payer: Self-pay | Admitting: Sports Medicine

## 2013-04-30 VITALS — BP 116/77 | HR 105 | Temp 98.7°F | Wt 163.0 lb

## 2013-04-30 DIAGNOSIS — F319 Bipolar disorder, unspecified: Secondary | ICD-10-CM

## 2013-04-30 DIAGNOSIS — F4310 Post-traumatic stress disorder, unspecified: Secondary | ICD-10-CM

## 2013-04-30 NOTE — Patient Instructions (Signed)
   Increase to 2 tablets at night in 1 week if tolerating medication well  Return to see me 3-4 weeks after being seen in Mood Disorder Clinic  Be sure to call and follow up with [facility] Psychology clinic    If you need anything prior to your next visit please call the clinic. Please Bring all medications or accurate medication list with you to each appointment; an accurate medication list is essential in providing you the best care possible.

## 2013-04-30 NOTE — Assessment & Plan Note (Signed)
Patient was having daytime somnolence with taking medicine at breakfast time. She started taking the medicine at night yesterday and seems to be doing well today.  Plan to titrate to 40 mg in one week if continues to do well and not having any persistent daytime somnolence. Patient denies any SI or HI at this time. Otherwise tolerating medicines well. Followup with mood disorder clinic has been scheduled. Encouraged to followup with [facility] psychology clinic and pt agreeable

## 2013-04-30 NOTE — Progress Notes (Signed)
[facility] - 22 y.o. female MRN [id number]  Date of birth: [birth year]  CC, HPI, INTERVAL HISTORY & ROS  Michelle [name] is here today for follow up of Bipolar disorder.    She reports doing well since starting Jordan.   Denies any side effects from medications other than sleepiness that has improved since changing to QHS dosing.  Denies SI/HI.    Reports temper has improved and no arguments "so far" this week.  Has scheduled appointment with [facility]  Has not touched based with [facility] for counseling  Reports prior CBT counseling did not seem to help her symptoms and only made her "relive" the experiences without improvement in her associated symptoms  History  Past Medical, Surgical, Social, and Family History Reviewed per EMR Medications and Allergies reviewed and all updated if necessary. Objective Findings  VITALS: HR: 105 bpm  BP: 116/77 mmHg  TEMP: 98.7 F (37.1 C) (Oral)  RESP:    HT:    WT: 163 lb (73.936 kg)  BMI:     BP Readings from Last 3 Encounters:  04/30/13 116/77  04/25/13 111/75  04/19/13 112/74   Wt Readings from Last 3 Encounters:  04/30/13 163 lb (73.936 kg)  04/25/13 164 lb (74.39 kg)  04/19/13 160 lb (72.576 kg)     PHYSICAL EXAM: GENERAL: Adult Caucasian  female. In no discomfort; no respiratory distress  PSYCH: alert and appropriate, good insight,  Seems to more engaged, and less stress.  Here today with 2 young children and her mother and appears to handle the stress of parenting well.    Assessment & Plan   Problems addressed today: General Plan & Pt Instructions:  1. Bipolar I disorder, most recent episode (or current) unspecified   2. PTSD       Increase to 2 tablets at night in 1 week if tolerating medication well  Return to see me 3-4 weeks after being seen in Mood Disorder Clinic  Be sure to call and follow up with [facility] Psychology clinic      For further discussion of A/P and for follow up issues see  problem based charting if applicable.

## 2013-04-30 NOTE — Assessment & Plan Note (Addendum)
Patient will followup with [facility] for counseling.  Patient would likely benefit from other forms of counseling other than CBT as this has seemed to only exacerbate her symptoms previously. > Consider SSRI in addition to Latuda once mood is improved and still continuing to have PTSD symptoms.

## 2013-05-09 ENCOUNTER — Telehealth: Payer: Self-pay | Admitting: Sports Medicine

## 2013-05-09 NOTE — Telephone Encounter (Signed)
She has taken double of her medication-latuda Just wanted him to know

## 2013-05-12 NOTE — Telephone Encounter (Signed)
Agree with up titration. Followup as previously scheduled

## 2013-05-16 ENCOUNTER — Other Ambulatory Visit: Payer: Self-pay | Admitting: Sports Medicine

## 2013-05-19 ENCOUNTER — Telehealth: Payer: Self-pay | Admitting: Sports Medicine

## 2013-05-19 ENCOUNTER — Other Ambulatory Visit: Payer: Self-pay | Admitting: Sports Medicine

## 2013-05-19 DIAGNOSIS — F319 Bipolar disorder, unspecified: Secondary | ICD-10-CM

## 2013-05-19 MED ORDER — LURASIDONE HCL 40 MG PO TABS: 40.0000 mg | ORAL_TABLET | Freq: Every day | ORAL | Status: DC

## 2013-05-19 NOTE — Telephone Encounter (Signed)
Rx for 40mg  tablet per last phone note

## 2013-05-19 NOTE — Telephone Encounter (Signed)
Pt called for a refill on Latuda sent to her pharmacy. JW

## 2013-05-21 ENCOUNTER — Ambulatory Visit (INDEPENDENT_AMBULATORY_CARE_PROVIDER_SITE_OTHER): Payer: Medicaid Other | Admitting: Psychology

## 2013-05-21 DIAGNOSIS — F319 Bipolar disorder, unspecified: Secondary | ICD-10-CM

## 2013-05-21 LAB — T4, FREE: Free T4: 0.99 ng/dL (ref 0.80–1.80)

## 2013-05-21 LAB — TSH: TSH: 1.625 u[IU]/mL (ref 0.350–4.500)

## 2013-05-21 LAB — T3, FREE: T3, Free: 3.1 pg/mL (ref 2.3–4.2)

## 2013-05-21 NOTE — Patient Instructions (Signed)
Please schedule a follow-up for:  December 3rd at 9:30 in the Mood Clinic. The [facility] number is [phone]. There waiting list is Jan or Feb. Arrie Senate might be a good option.  Her info is:  [address] [place] , [place]   [address] [phone]   Another option is [facility] [place]:  [phone].  They are supposed to return phone calls within a certain period of time but it will come from a blocked number (probably will read [phone]).   You should answer the phone because they won't leave a voice mail. If you have access to the internet, you might want to check out the website:  [email or web address].  You will read some truly inspiring stories here and also can learn a bunch about your illness.

## 2013-05-21 NOTE — Progress Notes (Signed)
Michelle [name] presents for a [facility] appointment after a long hiatus.  She has recently seen Dr. Berline Chough for mood issues and was started on Latuda around 04/25/13.  Was increased to 40 mg per patient's report.  Ran out this past Thursday.  Prior to running out, noticed less anger / need to pick fights, more motivation and energy and less fatigue.  She sought out and got a job during this time.  Since coming off the medicine, she has noticed a return of these symptoms.  She tolerated the medicine well.  No reported SI / HI.    TSH had been ordered but never obtained.  Last TSH in 2008 was abnormal.  Patient reported she followed through with therapy for two sessions but was not yet ready to talk about her traumatic past.  She thinks she is more ready now and needs to have these things addressed.  Says she has called [facility] but no one answers.  Living with boyfriend - father of her third child (about a year old).  Considers this a positive relationship.

## 2013-05-21 NOTE — Assessment & Plan Note (Addendum)
Patient report of mood is mildly depressed / irritable.  Affect is consistent.  Tearful on occasions.  Symptom report continues to be consistent with diagnosis of Bipolar Disorder.  Positive response to Latuda prior to running out of the medication.  Wishes to restart.  Dr. Kathrynn Running prescribed 40 mg #30 with one refill.  Will ask patient to follow with Dr. Berline Chough in 1-2 weeks with a return to our clinic December 3rd.  Therapy resources were provided to address PTSD issues.  See patient instructions for further plan.

## 2013-05-22 LAB — THYROID PEROXIDASE ANTIBODY: Thyroperoxidase Ab SerPl-aCnc: <10 IU/mL (ref <35.0)

## 2013-06-01 ENCOUNTER — Emergency Department (HOSPITAL_COMMUNITY)
Admission: EM | Admit: 2013-06-01 | Discharge: 2013-06-01 | Discharge status: Against Medical Advice | Payer: Medicaid Other | Source: Home / Self Care

## 2013-06-01 ENCOUNTER — Encounter (HOSPITAL_COMMUNITY): Payer: Self-pay | Admitting: Emergency Medicine

## 2013-06-01 NOTE — ED Notes (Signed)
Pt c/o swellig in hands and feet x 1 day. Pt reports she had a tooth pulled on Thursday and face is swollen also. She was given pain medicine and noticed the swelling after taking it and also had diarrhea yesterday. Pt is alert and oriented and in no acute distress.

## 2013-06-09 ENCOUNTER — Ambulatory Visit (INDEPENDENT_AMBULATORY_CARE_PROVIDER_SITE_OTHER): Payer: Medicaid Other | Admitting: Sports Medicine

## 2013-06-09 ENCOUNTER — Encounter: Payer: Self-pay | Admitting: Sports Medicine

## 2013-06-09 VITALS — BP 117/75 | HR 114 | Temp 99.0°F | Ht 62.0 in | Wt 171.0 lb

## 2013-06-09 DIAGNOSIS — F4310 Post-traumatic stress disorder, unspecified: Secondary | ICD-10-CM

## 2013-06-09 DIAGNOSIS — M26609 Unspecified temporomandibular joint disorder, unspecified side: Secondary | ICD-10-CM

## 2013-06-09 DIAGNOSIS — F319 Bipolar disorder, unspecified: Secondary | ICD-10-CM

## 2013-06-09 MED ORDER — MELOXICAM 15 MG PO TABS: 15.0000 mg | ORAL_TABLET | Freq: Every day | ORAL | Status: DC

## 2013-06-09 MED ORDER — LURASIDONE HCL 40 MG PO TABS: 40.0000 mg | ORAL_TABLET | Freq: Every day | ORAL | Status: DC

## 2013-06-09 MED ORDER — CYCLOBENZAPRINE HCL 10 MG PO TABS: 10.0000 mg | ORAL_TABLET | Freq: Three times a day (TID) | ORAL | Status: DC | PRN

## 2013-06-09 NOTE — Patient Instructions (Signed)
   Change Latuda to each morning  Keep exercising  Keep [facility] appointment;   Try stopping Flexeril  Remember towel exercises  Keep taking mobic    If you need anything prior to your next visit please call the clinic. Please Bring all medications or accurate medication list with you to each appointment; an accurate medication list is essential in providing you the best care possible.

## 2013-06-09 NOTE — Progress Notes (Signed)
Michelle [name] - 22 y.o. female MRN [id number]  Date of birth: 1990/11/17  CC, HPI, INTERVAL HISTORY & ROS  Michelle [name] is here today for follow up of Bipolar disorder.    She reports doing well since starting Jordan.   Reports temper has improved and only 1 argument since last visit.  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  Flexeril has been causing some strange dreams and does not seem to be helping her  She is having difficulty with sleep and some bizarre dreams.  Not performing any exercises.  History  Past Medical, Surgical, Social, and Family History Reviewed per EMR Medications and Allergies reviewed and all updated if necessary. Objective Findings  VITALS: HR: 114 bpm  BP: 117/75 mmHg  TEMP: 99 F (37.2 C) (Oral)  RESP:    HT: 5\' 2"  (157.5 cm)  WT: 171 lb (77.565 kg)  BMI: 31.3   BP Readings from Last 3 Encounters:  06/09/13 117/75  06/01/13 116/76  04/30/13 116/77   Wt Readings from Last 3 Encounters:  06/09/13 171 lb (77.565 kg)  04/30/13 163 lb (73.936 kg)  04/25/13 164 lb (74.39 kg)     PHYSICAL EXAM: GENERAL: Adult Caucasian  female. In no discomfort; no respiratory distress  PSYCH: alert and appropriate, good insight,  mood seems significantly improved   MSK   cervical range of motion restricted in extension, full and flexion, sidebending, rotation.  There are no allergic symptoms.  No focal weakness found.      Assessment & Plan   Problems addressed today: General Plan & Pt Instructions:  1. PTSD   2. TMJ dysfunction   3. DSORD BIPOLAR I, [facility], MOST RECENT EPSD       Change Latuda to each morning  Keep exercising  Keep [facility] appointment;   Try stopping Flexeril  Remember towel exercises  Keep taking mobic      For further discussion of A/P and for follow up  issues see problem based charting if applicable.

## 2013-06-10 ENCOUNTER — Encounter: Payer: Self-pay | Admitting: Sports Medicine

## 2013-06-10 NOTE — Assessment & Plan Note (Signed)
Encouraged to keep counseling appointment with [facility] after the holidays. The followup appointment with mood disorder clinic.  Seems to be doing fairly well on Latuda but is having some sleep disorder.  We'll plan to change back to  q.a.m. dosing.

## 2013-06-10 NOTE — Assessment & Plan Note (Signed)
Seems to be tolerating Latuda well except with disordered sleep. Has been exercising on a daily basis but not sleeping well during the night Change to every morning dosing

## 2013-06-10 NOTE — Assessment & Plan Note (Signed)
Dr. Barbette Merino has been treating with Mobic and Flexeril.  Will discontinue Flexeril as having some possible reaction. Review of cervical stretching exercises (towel exercises) and encouraged to do 2-3 times per day. Continue Mobic

## 2013-06-18 ENCOUNTER — Ambulatory Visit: Payer: Medicaid Other | Admitting: Psychology

## 2013-11-02 ENCOUNTER — Encounter (HOSPITAL_COMMUNITY): Payer: Self-pay | Admitting: Emergency Medicine

## 2013-11-02 ENCOUNTER — Emergency Department (HOSPITAL_COMMUNITY)
Admission: EM | Admit: 2013-11-02 | Discharge: 2013-11-02 | Disposition: A | Discharge status: Home / Self Care | Payer: Medicaid Other | Attending: Emergency Medicine | Admitting: Emergency Medicine

## 2013-11-02 DIAGNOSIS — L600 Ingrowing nail: Secondary | ICD-10-CM | POA: Insufficient documentation

## 2013-11-02 DIAGNOSIS — Z8679 Personal history of other diseases of the circulatory system: Secondary | ICD-10-CM | POA: Insufficient documentation

## 2013-11-02 DIAGNOSIS — Z8669 Personal history of other diseases of the nervous system and sense organs: Secondary | ICD-10-CM | POA: Insufficient documentation

## 2013-11-02 DIAGNOSIS — F319 Bipolar disorder, unspecified: Secondary | ICD-10-CM | POA: Insufficient documentation

## 2013-11-02 DIAGNOSIS — Z8632 Personal history of gestational diabetes: Secondary | ICD-10-CM | POA: Insufficient documentation

## 2013-11-02 DIAGNOSIS — Z79899 Other long term (current) drug therapy: Secondary | ICD-10-CM | POA: Insufficient documentation

## 2013-11-02 DIAGNOSIS — Z791 Long term (current) use of non-steroidal anti-inflammatories (NSAID): Secondary | ICD-10-CM | POA: Insufficient documentation

## 2013-11-02 DIAGNOSIS — Z8742 Personal history of other diseases of the female genital tract: Secondary | ICD-10-CM | POA: Insufficient documentation

## 2013-11-02 DIAGNOSIS — F17200 Nicotine dependence, unspecified, uncomplicated: Secondary | ICD-10-CM | POA: Insufficient documentation

## 2013-11-02 MED ORDER — IBUPROFEN 800 MG PO TABS: 800.0000 mg | ORAL_TABLET | Freq: Three times a day (TID) | ORAL | Status: DC

## 2013-11-02 MED ORDER — CEPHALEXIN 500 MG PO CAPS: 500.0000 mg | ORAL_CAPSULE | Freq: Four times a day (QID) | ORAL | Status: DC

## 2013-11-02 MED ORDER — HYDROCODONE-ACETAMINOPHEN 5-325 MG PO TABS: 1.0000 | ORAL_TABLET | Freq: Four times a day (QID) | ORAL | Status: DC | PRN

## 2013-11-02 MED ORDER — OXYCODONE-ACETAMINOPHEN 5-325 MG PO TABS
1.0000 | ORAL_TABLET | Freq: Once | ORAL | Status: AC
  Administered 2013-11-02: 1 via ORAL
  Filled 2013-11-02: qty 1

## 2013-11-02 NOTE — Discharge Instructions (Signed)
Ibuprofen for pain. Norco for severe pain. Antibacterial soap soaks every 1-2 hours. Keflex as prescribed until all gone. Follow up with podietry.    Infected Ingrown Toenail An infected ingrown toenail occurs when the nail edge grows into the skin and bacteria invade the area. Symptoms include pain, tenderness, swelling, and pus drainage from the edge of the nail. Poorly fitting shoes, minor injuries, and improper cutting of the toenail may also contribute to the problem. You should cut your toenails squarely instead of rounding the edges. Do not cut them too short. Avoid tight or pointed toe shoes. Sometimes the ingrown portion of the nail must be removed. If your toenail is removed, it can take 3-4 months for it to re-grow. HOME CARE INSTRUCTIONS   Soak your infected toe in warm water for 20-30 minutes, 2 to 3 times a day.  Packing or dressings applied to the area should be changed daily.  Take medicine as directed and finish them.  Reduce activities and keep your foot elevated when able to reduce swelling and discomfort. Do this until the infection gets better.  Wear sandals or go barefoot as much as possible while the infected area is sensitive.  See your caregiver for follow-up care in 2-3 days if the infection is not better. SEEK MEDICAL CARE IF:  Your toe is becoming more red, swollen or painful. MAKE SURE YOU:   Understand these instructions.  Will watch your condition.  Will get help right away if you are not doing well or get worse. Document Released: 08/10/2004 Document Revised: 09/25/2011 Document Reviewed: 06/29/2008 [facility] Patient Information 2014 [place].

## 2013-11-02 NOTE — ED Notes (Signed)
Pt reports ingrown toenail to left big toe ongoing x 1 week. Pt reports history of same. Pt presents with redness to left big toe nail.

## 2013-11-02 NOTE — ED Notes (Signed)
Pt st's she has had a ingrown toenail on left great toe for a few days.  Now toe red swollen and painful

## 2013-11-02 NOTE — ED Provider Notes (Signed)
CSN: [id number]     Arrival date & time 11/02/13  1804 History  This chart was scribed for non-physician practitioner working with Blanchie Dessert, MD by Mercy Moore, ED Scribe. This patient was seen in room TR10C/TR10C and the patient's care was started at 7:40 PM.   Chief Complaint  Patient presents with  . Nail Problem    Left big toe nail      The history is provided by the patient. No language interpreter was used.   HPI Comments: Michelle [name] is a 23 y.o. female who presents to the Emergency Department complaining of ingrown left great toenail, ongoing for one week. Yesterday the pain significantly worsened. Patient reports the pain was so severe that she was unable to sleep last night. Patient reports being unable to walk without feeling excruciating pain and is unable tolerate much pressure to the toe without pain. Patient reports history of frequent ingrown toenails on both great toes every few months. Patient denies fever or chills.   Past Medical History  Diagnosis Date  . Tobacco abuse   . Menorrhagia   . 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 H/O eclampsia     "son was born @ 33wk because of it" (07/25/2012)  . Seizures 06/20/2012    "had one when they pulled baby out via C-section" (07/25/2012)  . Migraines   . Bipolar I disorder   . PTSD (post-traumatic stress disorder)     "started back when I was 23 yr old" (07/25/2012)  . Bipolar disorder   . Eclampsia 06/19/2012  . Gestational diabetes 2007; 2008  . Gestational hypertension     "back to normalish 2 wk after son born" (07/25/2012)   Past Surgical History  Procedure Laterality Date  . Cesarean section  06/20/2012    Procedure: CESAREAN SECTION;  Surgeon: Emily Filbert, MD;  Location: [facility];  Service: Obstetrics;  Laterality: N/A;  . Appendectomy  07/25/2012  . Adenoidectomy, tonsillectomy and myringotomy with tube placement  1990's  . Laparoscopic appendectomy  07/25/2012    Procedure: APPENDECTOMY  LAPAROSCOPIC;  Surgeon: Zenovia Jarred, MD;  Location: [facility];  Service: General;  Laterality: N/A;   Family History  Problem Relation Age of Onset  . Breast cancer Paternal Grandmother   . Colon cancer Paternal Grandfather   . Colon cancer Maternal Grandfather   . Colon polyps Maternal Aunt   . Diabetes Mother     maternal aunts and uncles  . Diabetes Sister   . Diabetes Father   . Heart disease Father    History  Substance Use Topics  . Smoking status: Current Some Day Smoker -- 0.25 packs/day for 14 years    Types: Cigarettes  . Smokeless tobacco: Never Used  . Alcohol Use: No     Comment: 07/25/2012 "New Year's or other occasions drinker"   OB History   Grav Para Term Preterm Abortions TAB SAB Ect Mult Living   4 3 2 1      3      Obstetric Comments   C-Section Indication: Non-reassuring fetal tracing, growth delay & marked proteinuria & Pre-Eclampsia Eclamptic Seizure during C-section delivery     Review of Systems  Constitutional: Negative for fever and chills.  Musculoskeletal:       Ingrown toenail.       Allergies  Codeine  Home Medications   Prior to Admission medications   Medication Sig Start Date End Date Taking? Authorizing  Provider  cyclobenzaprine (FLEXERIL) 10 MG tablet Take 1 tablet (10 mg total) by mouth 3 (three) times daily as needed for muscle spasms. Per Dr. Hoyt Koch (DDS) 06/09/13   Gerda Diss, DO  lurasidone (LATUDA) 40 MG TABS tablet Take 1 tablet (40 mg total) by mouth at bedtime. 06/09/13   Gerda Diss, DO  meloxicam (MOBIC) 15 MG tablet Take 1 tablet (15 mg total) by mouth daily. Per Dr. Hoyt Koch (DDS) 06/09/13   Gerda Diss, DO  PROAIR HFA 108 [phone] BASE) MCG/ACT inhaler INHALE 2 PUFFS INTO THE LUNGS EVERY 4 HOURS AS NEEDED FOR WHEEZING 05/16/13   Gerda Diss, DO   Triage Vitals: BP 113/69  Pulse 115  Temp(Src) 98.6 F (37 C) (Oral)  Resp 16  Ht 5' 2.5" (1.588 m)  Wt 172 lb 1 oz (78.047 kg)  BMI 30.95 kg/m2  SpO2 98%   Breastfeeding? No Physical Exam  Nursing note and vitals reviewed. Constitutional: She is oriented to person, place, and time. She appears well-developed and well-nourished. No distress.  HENT:  Head: Normocephalic and atraumatic.  Eyes: EOM are normal.  Neck: Neck supple. No tracheal deviation present.  Cardiovascular: Normal rate.   Pulmonary/Chest: Effort normal. No respiratory distress.  Musculoskeletal: Normal range of motion.  Neurological: She is alert and oriented to person, place, and time.  Skin: Skin is warm and dry.  Ingrown left great toenail, with surrounding erythema, tenderness. Full ROM of the toe. No drainage. Fungal nail infection present with toenail yellowed and thickened.   Psychiatric: She has a normal mood and affect. Her behavior is normal.    ED Course  Procedures (including critical care time) DIAGNOSTIC STUDIES: Oxygen Saturation is 98% on room air, normal by my interpretation.    COORDINATION OF CARE: 7:41 PM- Appears that patient has fungal infection: left great toe nail is discolored and hardened. Will refer to pediatrist. Will prescribe pain medication and antibiotic. Discussed treatment plan with patient at bedside and patient agreed to plan.     Labs Review Labs Reviewed - No data to display  Imaging Review No results found.   EKG Interpretation None      MDM   Final diagnoses:  Ingrown left big toenail    Patient in emergency department with an ingrown left toenail. Her toenail is thickened, yellow, surrounding erythema and tenderness. Patient is hesitant about any cutting her toenail out, and I am concerned about doing that with a fungal infection. Will start her on antibiotics, Keflex, for infection, instructed to do warm soaks at home multiple times a day, followup with podiatrist.  Filed Vitals:   11/02/13 1817  BP: 113/69  Pulse: 115  Temp: 98.6 F (37 C)  TempSrc: Oral  Resp: 16  Height: 5' 2.5" (1.588 m)  Weight: 172 lb  1 oz (78.047 kg)  SpO2: 98%    I personally performed the services described in this documentation, which was scribed in my presence. The recorded information has been reviewed and is accurate.   Renold Genta, PA-C 11/03/13 0246

## 2013-11-04 ENCOUNTER — Ambulatory Visit (INDEPENDENT_AMBULATORY_CARE_PROVIDER_SITE_OTHER): Payer: Medicaid Other | Admitting: Podiatry

## 2013-11-04 ENCOUNTER — Encounter: Payer: Self-pay | Admitting: Podiatry

## 2013-11-04 VITALS — BP 118/73 | HR 86 | Ht 62.5 in | Wt 172.0 lb

## 2013-11-04 DIAGNOSIS — L03039 Cellulitis of unspecified toe: Secondary | ICD-10-CM | POA: Insufficient documentation

## 2013-11-04 DIAGNOSIS — M79609 Pain in unspecified limb: Secondary | ICD-10-CM

## 2013-11-04 DIAGNOSIS — L600 Ingrowing nail: Secondary | ICD-10-CM

## 2013-11-04 MED ORDER — HYDROCODONE-IBUPROFEN 7.5-200 MG PO TABS: 1.0000 | ORAL_TABLET | Freq: Three times a day (TID) | ORAL | Status: DC | PRN

## 2013-11-04 NOTE — Progress Notes (Signed)
Subjective: 23 year old female presents with her mother complaining of painful ingrown nail off and on. But this is the first time the nail got infected on the left great toe at medial border.   Objective: Incurvated nails both great toes on both borders. Inflamed ingrown nail with pus pocket at medial nail border left hallux. No ascending cellulitis or edema noted. Neurovascular status are within normal.  Assessment: Ingrown nail left hallux with paronychia.  Plan: Reviewed findings and available options. Patient and her mother requested to have surgical correction of the ingrown nail left hallux.  Procedure done as follow; Affected left great toe was anesthetized with total 71ml mixture of 50/50 0.5% Marcaine plain and 1% Xylocaine plain. Affected nail border was reflected with a nail elevator and excised with nail nipper. Proximal nail matrix tissue was cauterized with Phenol soaked cotton applicator x 4 and neutralized with Alcohol soaked cotton applicator. The wound was dressed with Amerigel ointment dressing. Home care instructions and supply dispensed.  Return in 1 week for follow up.

## 2013-11-04 NOTE — Patient Instructions (Signed)
Ingrown nail surgery left great toe medial border done.

## 2013-11-05 NOTE — ED Provider Notes (Signed)
Medical screening examination/treatment/procedure(s) were performed by non-physician practitioner and as supervising physician I was immediately available for consultation/collaboration.   EKG Interpretation None        Blanchie Dessert, MD 11/05/13 1526

## 2013-11-11 ENCOUNTER — Encounter: Payer: Self-pay | Admitting: Podiatry

## 2013-11-11 ENCOUNTER — Ambulatory Visit (INDEPENDENT_AMBULATORY_CARE_PROVIDER_SITE_OTHER): Payer: Medicaid Other | Admitting: Podiatry

## 2013-11-11 ENCOUNTER — Ambulatory Visit: Payer: Medicaid Other | Admitting: Sports Medicine

## 2013-11-11 VITALS — BP 128/90 | HR 112

## 2013-11-11 DIAGNOSIS — L600 Ingrowing nail: Secondary | ICD-10-CM

## 2013-11-11 NOTE — Patient Instructions (Signed)
Doing well following nail surgery. No sign of infection. Continue to soak till tenderness subside.  Return as needed.

## 2013-11-11 NOTE — Progress Notes (Signed)
1 week follow up on left great toe nail surgery. Wound is clean and dry. No sign of infection. Continue to soak till tenderness subside. Return as needed.

## 2013-11-14 ENCOUNTER — Telehealth: Payer: Self-pay | Admitting: *Deleted

## 2013-11-14 NOTE — Telephone Encounter (Signed)
Patient called this afternoon requesting pain med for her toe,had surgery in the last two weeks.She states she has a 50 month old that keeps stepping on her foot.

## 2013-11-26 ENCOUNTER — Telehealth: Payer: Self-pay | Admitting: *Deleted

## 2013-11-26 ENCOUNTER — Encounter: Payer: Self-pay | Admitting: Podiatry

## 2013-11-26 ENCOUNTER — Ambulatory Visit (INDEPENDENT_AMBULATORY_CARE_PROVIDER_SITE_OTHER): Payer: Medicaid Other | Admitting: Podiatry

## 2013-11-26 VITALS — BP 116/74 | HR 99 | Ht 62.5 in | Wt 172.0 lb

## 2013-11-26 DIAGNOSIS — M79609 Pain in unspecified limb: Secondary | ICD-10-CM

## 2013-11-26 DIAGNOSIS — L600 Ingrowing nail: Secondary | ICD-10-CM

## 2013-11-26 DIAGNOSIS — M79606 Pain in leg, unspecified: Secondary | ICD-10-CM | POA: Insufficient documentation

## 2013-11-26 DIAGNOSIS — L03039 Cellulitis of unspecified toe: Secondary | ICD-10-CM

## 2013-11-26 MED ORDER — HYDROCODONE-IBUPROFEN 7.5-200 MG PO TABS: 1.0000 | ORAL_TABLET | Freq: Three times a day (TID) | ORAL | Status: DC | PRN

## 2013-11-26 NOTE — Telephone Encounter (Signed)
Peggy from [facility] called to request NPI number.  NPI number x 2 visits given. Derl Barrow, RN

## 2013-11-26 NOTE — Patient Instructions (Signed)
Seen for post op nail surgery on left that has healed well. As per request, right great toe nail surgery done. Follow soaking instruction and return in one week.

## 2013-11-26 NOTE — Progress Notes (Signed)
Subjective:  23 year old female presents with her mother for follow up on left great toe nail surgery and a request for right great toe ingrown nail surgery.   Objective: Left great toe post surgical site healed well and dry. Incurvated nails right great toes on both borders.  Neurovascular status are within normal.   Assessment: Ingrown nail right hallux with pain on both borders.   Plan: Reviewed findings and available options.  Patient and her mother requested to have surgical correction of the ingrown nail on right great toe both borders.   Procedure done; P&A Matrixectomy right hallux both borders. Affected right great toe was anesthetized with total 43ml mixture of 50/50 0.5% Marcaine plain and 1% Xylocaine plain.  Affected both nail borders were reflected with a nail elevator and excised with nail nipper.  Both borders of proximal nail matrix tissue was cauterized with Phenol soaked cotton applicator x 4 and neutralized with Alcohol soaked cotton applicator.  The wound was dressed with Amerigel ointment dressing.  Home care instructions and supply dispensed.  Return in 1 week for follow up

## 2013-12-03 ENCOUNTER — Encounter: Payer: Medicaid Other | Admitting: Podiatry

## 2013-12-17 ENCOUNTER — Ambulatory Visit (INDEPENDENT_AMBULATORY_CARE_PROVIDER_SITE_OTHER): Payer: Medicaid Other | Admitting: Sports Medicine

## 2013-12-17 ENCOUNTER — Encounter: Payer: Self-pay | Admitting: Sports Medicine

## 2013-12-17 VITALS — BP 120/64 | HR 117 | Temp 99.4°F | Ht 62.5 in | Wt 174.0 lb

## 2013-12-17 DIAGNOSIS — F4310 Post-traumatic stress disorder, unspecified: Secondary | ICD-10-CM

## 2013-12-17 DIAGNOSIS — F17200 Nicotine dependence, unspecified, uncomplicated: Secondary | ICD-10-CM

## 2013-12-17 DIAGNOSIS — F319 Bipolar disorder, unspecified: Secondary | ICD-10-CM

## 2013-12-17 MED ORDER — LURASIDONE HCL 40 MG PO TABS: 40.0000 mg | ORAL_TABLET | Freq: Every day | ORAL | Status: DC

## 2013-12-17 NOTE — Patient Instructions (Signed)
Please restart the Latuda - try taking melatonin 5 mg 30 minutes prior to bed for the next 7 days.  Remember to work on your exercise . I recommend that you meet with our psychologist, Dr. Zella Ball, for help dealing with your bipolar disorder.  You can schedule an appointment with her by calling her directly at [phone].  We can consider starting Wellbutrin after talking with Dr. Tammi Klippel.

## 2013-12-17 NOTE — Progress Notes (Signed)
  INIOLUWA BOULAY - 23 y.o. female MRN [id number]  Date of birth: [birth year]  CC, SUBJECTIVE & ROS:     If applicable, see problem based charting for additional problem specific documentation. Chief Complaint  Patient presents with  . lack of Libido    HISTORY: Past Medical, Surgical, Social, and Family History Reviewed & Updated per EMR.  Pertinent Historical Findings include: Last appointment and mood disorder clinic 05/21/2013.  Thyroid labs at that time were normal.  Continued on Latuda Has undergone bilateral ingrown toenail surgeries in the past 2 months.  OBJECTIVE:  VS: BP:120/64 mmHg  HR:117bpm  TEMP:99.4 F (37.4 C)(Oral)  RESP:   HT:5' 2.5" (158.8 cm)   WT:174 lb (78.926 kg)  BMI:31.4 PHYSICAL EXAM:  GENERAL:  Adult caucasian  female. In no discomfort; no respiratory distress PSYCH Exam: Eye Contact:   Diminished Speech:  Clear, normal rate, normal volume Mood:  Depressed Affect:   Flat but better than I have seen in the past SI/HI:  None Orientation:    Knowledge:    Average Thought Proc:  Linear Thought Cont:  Appropriate Judgement:  Good Insight:  Moderate ---- Psychomotor: None Akathisia:  None  ASSESSMENT & PLAN: See problem based charting & AVS for pt instructions.  >50% of this 25 minute visit spent in direct patient counseling and/or coordination of care.

## 2013-12-19 NOTE — Assessment & Plan Note (Addendum)
Problem Based Documentation:    Subjective Report:  Patient has been off her Latuda for a couple of months.  She was feeling significantly better and self discontinued this.  Today she presents with reports of poor libido that has been present since the birth of her child.  She initially thought this would resolve on its own but is now interfering with her relationship. She reports being disinterested and denies any physical discomfort or physical dysfunction  She was previously seen by mood disorder clinic.  Additionally she is interested in quitting smoking again and is wondering if there are medicines that would be beneficial for this.  Has not been to counseling     Assessment & Plan & Follow up Issues:  Encounter Diagnoses  Name Primary?  Marland Kitchen PTSD Yes  . SMOKER   . DSORD BIPOLAR I, UNSPC, MOST RECENT EPSD    chronic conditions  - not well controlled given self discontinuation of Latuda and lack of followup with counseling.  We discussed multiple options but I do feel primarily we need to restart a stabilizer before moving forward.  She reported doing well on the [place] and denies any significant side effects to this medicine.  Regarding other concurrent treatments we discussed the possibility of using either Chantix for Wellbutrin to help with smoking cessation.  I favor considering the addition of Wellbutrin to help with sexual dysfunction as well as smoking cessation however I will defer to MDQ with Dr. Gwenlyn Saran and Dr. Tammi Klippel to discuss this further with the patient.  We have discussed the importance and provided her resources to call in town for consideration of both personal and family counseling. 1. Restart Latuda 2. Followup with mood disorder clinic 3. Followup with psychology/counseling.  Resources provided  > Consider addition of Wellbutrin once stable on mood stabilizer    CONSULTATION WITH Mood Clinic:  Discussed Dr. Nicolasa Ducking plan in mood clinic.  Treatment thinks moving forward  with Wellbutrin is a reasonable option given the issues needed addressed (smoking and low libido).  Dr. Tammi Klippel recommends starting with 150 mg once a day for a couple of weeks to give sufficient time to assess safety and tolerability.  Would recommend being mindful of increased agitation, racing thoughts, insomnia or other signs of activation.  Will call patient to follow-up and recommend an appointment with [place] or her new PCP.

## 2013-12-31 ENCOUNTER — Ambulatory Visit: Payer: Medicaid Other | Admitting: Podiatry

## 2013-12-31 ENCOUNTER — Encounter: Payer: Self-pay | Admitting: Podiatry

## 2013-12-31 ENCOUNTER — Ambulatory Visit (INDEPENDENT_AMBULATORY_CARE_PROVIDER_SITE_OTHER): Payer: Medicaid Other | Admitting: Podiatry

## 2013-12-31 VITALS — BP 126/89 | HR 124

## 2013-12-31 DIAGNOSIS — M79606 Pain in leg, unspecified: Secondary | ICD-10-CM

## 2013-12-31 DIAGNOSIS — M79609 Pain in unspecified limb: Secondary | ICD-10-CM

## 2013-12-31 DIAGNOSIS — L600 Ingrowing nail: Secondary | ICD-10-CM

## 2013-12-31 DIAGNOSIS — L03039 Cellulitis of unspecified toe: Secondary | ICD-10-CM

## 2013-12-31 DIAGNOSIS — M79673 Pain in unspecified foot: Secondary | ICD-10-CM

## 2013-12-31 MED ORDER — CICLOPIROX 8 % EX SOLN: Freq: Every day | CUTANEOUS | Status: DC

## 2013-12-31 MED ORDER — HYDROCODONE-IBUPROFEN 7.5-200 MG PO TABS: 1.0000 | ORAL_TABLET | Freq: Three times a day (TID) | ORAL | Status: DC | PRN

## 2013-12-31 NOTE — Patient Instructions (Signed)
Ingrown nail surgery done on left great toe. Follow soaking instruction and return in one week.

## 2013-12-31 NOTE — Progress Notes (Signed)
Last week started to have painful ingrown nail left lateral border. Has had problems on other toe nails that are fixed.   Objective: Red inflamed left lateral border with paronychia.  Assessment: Infected ingrown nail left great toe nail.  Plan: As per request left great toe ingrown nail surgery done. Phenol and Alcohol Matrixectomy lateral border left great toe.  Affected toe was anesthetized with total 67ml mixture of 50/50 0.5% Marcaine plain and 1% Xylocaine plain. Affected nail border was reflected with a nail elevator and excised with nail nipper. Proximal nail matrix tissue was cauterized with Phenol soaked cotton applicator x 4 and neutralized with Alcohol soaked cotton applicator. The wound was dressed with Amerigel ointment dressing. Home care instructions and supply dispensed.  Return in 1 week for follow up.

## 2014-01-01 ENCOUNTER — Ambulatory Visit: Payer: Medicaid Other | Admitting: Sports Medicine

## 2014-01-07 ENCOUNTER — Encounter: Payer: Medicaid Other | Admitting: Podiatry

## 2014-01-28 ENCOUNTER — Encounter: Payer: Self-pay | Admitting: Family Medicine

## 2014-01-28 ENCOUNTER — Ambulatory Visit (INDEPENDENT_AMBULATORY_CARE_PROVIDER_SITE_OTHER): Payer: Medicaid Other | Admitting: Family Medicine

## 2014-01-28 VITALS — BP 118/60 | HR 72 | Temp 98.8°F | Wt 176.0 lb

## 2014-01-28 DIAGNOSIS — F4310 Post-traumatic stress disorder, unspecified: Secondary | ICD-10-CM

## 2014-01-28 DIAGNOSIS — F319 Bipolar disorder, unspecified: Secondary | ICD-10-CM

## 2014-01-28 MED ORDER — ALBUTEROL SULFATE HFA 108 (90 BASE) MCG/ACT IN AERS: 2.0000 | INHALATION_SPRAY | RESPIRATORY_TRACT | Status: DC | PRN

## 2014-01-28 MED ORDER — VENLAFAXINE HCL ER 37.5 MG PO CP24: 37.5000 mg | ORAL_CAPSULE | Freq: Every day | ORAL | Status: DC

## 2014-01-28 MED ORDER — LURASIDONE HCL 40 MG PO TABS: 40.0000 mg | ORAL_TABLET | Freq: Every day | ORAL | Status: DC

## 2014-01-28 NOTE — Patient Instructions (Signed)
-   Start taking venlafaxine 37.5mg  one capsule once a day for 7 days, after that take one capsule twice a day for 7 days and call the family medicine center at [phone] and leave a message telling me how the medication is working for you and what side effects you are noticing. I will get back to you and we can make changes as necessary.  - Start taking your latuda as you were.  - I recommend that you meet with our psychologist, Dr. Zella Ball, for help dealing with your depression.  You can schedule an appointment with her by calling her directly at [phone].  If you start having increased thoughts of suicide or inability to sleep please call the clinic immediately.
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
## 2014-02-03 NOTE — Assessment & Plan Note (Signed)
Agree that wellbutrin is ideal to treat mood and possibly poor libido while augmenting any smoking cessation efforts, though she reports a history of what sounds a lot like absence seizures, so will try alternative agents. Starting venlafaxine 37.5mg , titrate to 75mg  daily after 7 days, and restart latuda given the recent worsening of mood after discontinuation. Discussed at length risk of suicidal ideation and other side effects. Will follow up safety, tolerability and efficacy in 2 weeks by phone and 4 weeks at [place].

## 2014-02-21 ENCOUNTER — Encounter (HOSPITAL_COMMUNITY): Payer: Self-pay | Admitting: Emergency Medicine

## 2014-02-21 ENCOUNTER — Emergency Department (HOSPITAL_COMMUNITY)
Admission: EM | Admit: 2014-02-21 | Discharge: 2014-02-21 | Disposition: A | Discharge status: Home / Self Care | Payer: Medicaid Other | Attending: Emergency Medicine | Admitting: Emergency Medicine

## 2014-02-21 DIAGNOSIS — Z791 Long term (current) use of non-steroidal anti-inflammatories (NSAID): Secondary | ICD-10-CM | POA: Insufficient documentation

## 2014-02-21 DIAGNOSIS — Z8632 Personal history of gestational diabetes: Secondary | ICD-10-CM | POA: Diagnosis not present

## 2014-02-21 DIAGNOSIS — Z8742 Personal history of other diseases of the female genital tract: Secondary | ICD-10-CM | POA: Insufficient documentation

## 2014-02-21 DIAGNOSIS — F4310 Post-traumatic stress disorder, unspecified: Secondary | ICD-10-CM | POA: Insufficient documentation

## 2014-02-21 DIAGNOSIS — F319 Bipolar disorder, unspecified: Secondary | ICD-10-CM | POA: Insufficient documentation

## 2014-02-21 DIAGNOSIS — F17200 Nicotine dependence, unspecified, uncomplicated: Secondary | ICD-10-CM | POA: Diagnosis not present

## 2014-02-21 DIAGNOSIS — G43909 Migraine, unspecified, not intractable, without status migrainosus: Secondary | ICD-10-CM | POA: Diagnosis not present

## 2014-02-21 DIAGNOSIS — Z79899 Other long term (current) drug therapy: Secondary | ICD-10-CM | POA: Diagnosis not present

## 2014-02-21 DIAGNOSIS — L600 Ingrowing nail: Secondary | ICD-10-CM | POA: Insufficient documentation

## 2014-02-21 MED ORDER — OXYCODONE-ACETAMINOPHEN 5-325 MG PO TABS: 1.0000 | ORAL_TABLET | Freq: Four times a day (QID) | ORAL | Status: DC | PRN

## 2014-02-21 MED ORDER — CEPHALEXIN 500 MG PO CAPS: 500.0000 mg | ORAL_CAPSULE | Freq: Four times a day (QID) | ORAL | Status: DC

## 2014-02-21 MED ORDER — BACITRACIN 500 UNIT/GM EX OINT
1.0000 | TOPICAL_OINTMENT | Freq: Two times a day (BID) | CUTANEOUS | Status: DC
Start: 2014-02-21 — End: 2014-02-22
  Administered 2014-02-21: 1 via TOPICAL
  Filled 2014-02-21: qty 28

## 2014-02-21 NOTE — Discharge Instructions (Signed)
Ingrown Toenail An ingrown toenail occurs when the sharp edge of your toenail grows into the skin. Causes of ingrown toenails include toenails clipped too far back or poorly fitting shoes. Activities involving sudden stops (basketball, tennis) causing "toe jamming" may lead to an ingrown nail. HOME CARE INSTRUCTIONS   Soak the whole foot in warm soapy water for 20 minutes, 3 times per day.  You may lift the edge of the nail away from the sore skin by wedging a small piece of cotton under the corner of the nail. Be careful not to dig (traumatize) and cause more injury to the area.  Wear shoes that fit well. While the ingrown nail is causing problems, sandals may be beneficial.  Trim your toenails regularly and carefully. Cut your toenails straight across, not in a curve. This will prevent injury to the skin at the corners of the toenail.  Keep your feet clean and dry.  Crutches may be helpful early in treatment if walking is painful.  Antibiotics, if prescribed, should be taken as directed.  Return for a wound check in 2 days or as directed.  Only take over-the-counter or prescription medicines for pain, discomfort, or fever as directed by your caregiver. SEEK IMMEDIATE MEDICAL CARE IF:   You have a fever.  You have increasing pain, redness, swelling, or heat at the wound site.  Your toe is not better in 7 days. If conservative treatment is not successful, surgical removal of a portion or all of the nail may be necessary. MAKE SURE YOU:   Understand these instructions.  Will watch your condition.  Will get help right away if you are not doing well or get worse. Document Released: 06/30/2000 Document Revised: 09/25/2011 Document Reviewed: 06/24/2008 Exit[employer] Patient Information 2015 [employer]. This information is not intended to replace advice given to you by your health care provider. Make sure you discuss any questions you have with your health care provider.  

## 2014-02-21 NOTE — ED Provider Notes (Signed)
CSN: [id number]     Arrival date & time 02/21/14  2108 History   First MD Initiated Contact with Patient 02/21/14 2113     This chart was scribed for non-physician practitioner, Montine Circle PA-C working with Pamella Pert, MD by Forrestine Him, ED Scribe. This patient was seen in room TR07C/TR07C and the patient's care was started at 9:42 PM.   Chief Complaint  Patient presents with  . Ingrown Toenail   The history is provided by the patient. No language interpreter was used.    HPI Comments: Michelle [name] is a 23 y.o. female who presents to the Emergency Department complaining of constant, moderate pain to the L great toe x 2 days that is unchanged at this time. Pt associated discomfort with an ingrown toenail onset 1 week. She is requesting a possible antibiotic today as she feels the area may be infected. No fever or chills at this time. States she plans to follow up with Podiatry to have her toenail cut late next week. Pt with known allergy to Codeine. No other concerns this visit.  Past Medical History  Diagnosis Date  . Tobacco abuse   . Menorrhagia   . 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 H/O eclampsia     "son was born @ 33wk because of it" (07/25/2012)  . Seizures 06/20/2012    "had one when they pulled baby out via C-section" (07/25/2012)  . Migraines   . Bipolar I disorder   . PTSD (post-traumatic stress disorder)     "started back when I was 23 yr old" (07/25/2012)  . Bipolar disorder   . Eclampsia 06/19/2012  . Gestational diabetes 2007; 2008  . Gestational hypertension     "back to normalish 2 wk after son born" (07/25/2012)  . Onychia and paronychia of toe 11/04/2013   Past Surgical History  Procedure Laterality Date  . Cesarean section  06/20/2012    Procedure: CESAREAN SECTION;  Surgeon: Emily Filbert, MD;  Location: [facility];  Service: Obstetrics;  Laterality: N/A;  . Appendectomy  07/25/2012  . Adenoidectomy, tonsillectomy and myringotomy with tube  placement  1990's  . Laparoscopic appendectomy  07/25/2012    Procedure: APPENDECTOMY LAPAROSCOPIC;  Surgeon: Zenovia Jarred, MD;  Location: [facility] OR;  Service: General;  Laterality: N/A;   Family History  Problem Relation Age of Onset  . Breast cancer Paternal Grandmother   . Colon cancer Paternal Grandfather   . Colon cancer Maternal Grandfather   . Colon polyps Maternal Aunt   . Diabetes Mother     maternal aunts and uncles  . Diabetes Sister   . Diabetes Father   . Heart disease Father    History  Substance Use Topics  . Smoking status: Current Every Day Smoker -- 0.25 packs/day for 14 years    Types: Cigarettes  . Smokeless tobacco: Never Used  . Alcohol Use: No     Comment: 07/25/2012 "New Year's or other occasions drinker"   OB History   Grav Para Term Preterm Abortions TAB SAB Ect Mult Living   4 3 2 1      3      Obstetric Comments   C-Section Indication: Non-reassuring fetal tracing, growth delay & marked proteinuria & Pre-Eclampsia Eclamptic Seizure during C-section delivery     Review of Systems  Constitutional: Negative for fever and chills.  Musculoskeletal: Positive for arthralgias (L great toe).      Allergies  Codeine  Home Medications   Prior to Admission medications   Medication Sig Start Date End Date Taking? Authorizing Provider  albuterol (PROVENTIL HFA;VENTOLIN HFA) 108 (90 BASE) MCG/ACT inhaler Inhale 2 puffs into the lungs every 4 (four) hours as needed for wheezing or shortness of breath. 01/28/14   Patrecia Pour, MD  ciclopirox (PENLAC) 8 % solution Apply topically at bedtime. Apply over nail and surrounding skin. Apply daily over previous coat. After seven (7) days, may remove with alcohol and continue cycle. 12/31/13   Myeong Sheard, DPM  HYDROcodone-ibuprofen (VICOPROFEN) 7.5-200 MG per tablet Take 1 tablet by mouth every 8 (eight) hours as needed for moderate pain. 12/31/13   Myeong Sheard, DPM  ibuprofen (ADVIL,MOTRIN) 800 MG tablet Take 1  tablet (800 mg total) by mouth 3 (three) times daily. 11/02/13   Tatyana A Kirichenko, PA-C  lurasidone (LATUDA) 40 MG TABS tablet Take 1 tablet (40 mg total) by mouth at bedtime. 01/28/14   Patrecia Pour, MD  venlafaxine XR (EFFEXOR XR) 37.5 MG 24 hr capsule Take 1 capsule (37.5 mg total) by mouth daily with breakfast. 01/28/14   Patrecia Pour, MD   Triage Vitals: BP 121/75  Pulse 108  Temp(Src) 98.7 F (37.1 C)  Resp 16  Ht 5\' 2"  (1.575 m)  Wt 177 lb (80.287 kg)  BMI 32.37 kg/m2  SpO2 99%   Physical Exam  Nursing note and vitals reviewed. Constitutional: She is oriented to person, place, and time. She appears well-developed and well-nourished.  HENT:  Head: Normocephalic and atraumatic.  Eyes: Conjunctivae and EOM are normal.  Neck: Normal range of motion.  Cardiovascular: Normal rate.   Pulmonary/Chest: Effort normal.  Abdominal: She exhibits no distension.  Musculoskeletal: Normal range of motion.  Neurological: She is alert and oriented to person, place, and time.  Sensation intact  Skin: Skin is dry.  Ingrown toenail of left great toe on the radial aspect, no sign of infection  Psychiatric: She has a normal mood and affect. Her behavior is normal. Judgment and thought content normal.    ED Course  NAIL REMOVAL Date/Time: 02/21/2014 10:22 PM Performed by: Montine Circle Authorized by: Montine Circle Consent: Verbal consent obtained. Risks and benefits: risks, benefits and alternatives were discussed Consent given by: patient Patient understanding: patient states understanding of the procedure being performed Patient consent: the patient's understanding of the procedure matches consent given Procedure consent: procedure consent matches procedure scheduled Relevant documents: relevant documents present and verified Test results: test results available and properly labeled Site marked: the operative site was marked Imaging studies: imaging studies available Required  items: required blood products, implants, devices, and special equipment available Patient identity confirmed: verbally with patient and arm band Time out: Immediately prior to procedure a "time out" was called to verify the correct patient, procedure, equipment, support staff and site/side marked as required. Location: left foot Location details: left big toe Anesthesia: digital block Local anesthetic: lidocaine 2% without epinephrine Anesthetic total: 3.5 ml Patient sedated: no Preparation: skin prepped with Betadine Amount removed: partial Side: radial Wedge excision of skin of nail fold: no Nail bed sutured: no Nail matrix removed: none Removed nail replaced and anchored: no Dressing: 4x4, antibiotic ointment and gauze roll Patient tolerance: Patient tolerated the procedure well with no immediate complications.   (including critical care time)  DIAGNOSTIC STUDIES: Oxygen Saturation is 99% on RA, Normal by my interpretation.    COORDINATION OF CARE: 9:41 PM- Will cut ingrown toenail in ED. Advised pt to  continue with plan to follow up with Podiatry late next week. Discussed treatment plan with pt at bedside and pt agreed to plan.     Labs Review Labs Reviewed - No data to display  Imaging Review No results found.   EKG Interpretation None      MDM   Final diagnoses:  Ingrown nail    Patient with ingrown toenail of left great toe. The affected part of the nail was removed in the emergency department. Patient tolerated the procedure well. Will discharge to home with podiatry followup. Patient understands and agrees with the plan. She is stable and ready for discharge. She states that she frequently gets infections after tunnel surgeries, it is very nervous about this. I will give her some Keflex as well. I personally performed the services described in this documentation, which was scribed in my presence. The recorded information has been reviewed and is accurate.     Montine Circle, PA-C 02/21/14 2236

## 2014-02-21 NOTE — ED Notes (Signed)
Black nail polish removed from pts left greater toe with nail polish remover.

## 2014-02-21 NOTE — ED Notes (Signed)
The pt has had an ingrown toenail for one week no temp

## 2014-02-22 NOTE — ED Provider Notes (Signed)
Medical screening examination/treatment/procedure(s) were performed by non-physician practitioner and as supervising physician I was immediately available for consultation/collaboration.   EKG Interpretation None        Adalei Novell, MD 02/22/14 1223 

## 2014-03-02 ENCOUNTER — Telehealth: Payer: Self-pay | Admitting: Family Medicine

## 2014-03-02 MED ORDER — VENLAFAXINE HCL ER 37.5 MG PO CP24: 37.5000 mg | ORAL_CAPSULE | Freq: Every day | ORAL | Status: DC

## 2014-03-02 NOTE — Telephone Encounter (Signed)
Patient out of her venlafaxine XR and need rx asap

## 2014-03-13 ENCOUNTER — Ambulatory Visit: Payer: Medicaid Other | Admitting: Family Medicine

## 2014-03-25 ENCOUNTER — Encounter: Payer: Self-pay | Admitting: Family Medicine

## 2014-03-25 ENCOUNTER — Ambulatory Visit (INDEPENDENT_AMBULATORY_CARE_PROVIDER_SITE_OTHER): Payer: Medicaid Other | Admitting: Family Medicine

## 2014-03-25 VITALS — BP 119/71 | HR 82 | Temp 98.1°F | Ht 62.5 in | Wt 185.7 lb

## 2014-03-25 DIAGNOSIS — Z3009 Encounter for other general counseling and advice on contraception: Secondary | ICD-10-CM

## 2014-03-25 DIAGNOSIS — Z5189 Encounter for other specified aftercare: Secondary | ICD-10-CM | POA: Diagnosis present

## 2014-03-25 DIAGNOSIS — Z23 Encounter for immunization: Secondary | ICD-10-CM

## 2014-03-25 NOTE — Progress Notes (Signed)
Patient ID: Michelle [name], female   DOB: Feb 26, 1991, 23 y.o.   MRN: [id number]   Subjective:  Michelle [name] is a 23 y.o. female here for nexplanon removal.  Michelle [name] has had a nexplanon in her left arm for about 2 years and desires removal and permanent sterilization by tubal ligation. She is 23 years old and married and reports "no doubts whatsoever" about not wanting any future pregnancies. She has gained 20 lbs. since beginning on nexplanon which she attributes to nexplanon. She will use condoms for birth control until definitive contraception.   All other pertinent systems reviewed and are negative. Objective:  BP 119/71  Pulse 82  Temp(Src) 98.1 F (36.7 C) (Oral)  Ht 5' 2.5" (1.588 m)  Wt 185 lb 11.2 oz (84.233 kg)  BMI 33.40 kg/m2  Gen:  23 y.o. female in NAD Left arm: Without overlaying erythema with easily palpable superficial rod slightly proximal to scar from insertion site. Assessment:  Michelle [name] is a 23 y.o. female here for nexplanon removal.   Plan:  See problem list for problem-specific plans.

## 2014-03-25 NOTE — Patient Instructions (Signed)
Incision Care °An incision is when a surgeon cuts into your body tissues. After surgery, the incision needs to be cared for properly to prevent infection.  °HOME CARE INSTRUCTIONS  °· Take all medicine as directed by your caregiver. Only take over-the-counter or prescription medicines for pain, discomfort, or fever as directed by your caregiver. °· Do not remove your bandage (dressing) or get your incision wet until your surgeon gives you permission. In the event that your dressing becomes wet, dirty, or starts to smell, change the dressing and call your surgeon for instructions as soon as possible. °· Take showers. Do not take tub baths, swim, or do anything that may soak the wound until it is healed. °· Resume your normal diet and activities as directed or allowed. °· Avoid lifting any weight until you are instructed otherwise. °· Use anti-itch antihistamine medicine as directed by your caregiver. The wound may itch when it is healing. Do not pick or scratch at the wound. °· Follow up with your caregiver for stitch (suture) or staple removal as directed. °· Drink enough fluids to keep your urine clear or pale yellow. °SEEK MEDICAL CARE IF:  °· You have redness, swelling, or increasing pain in the wound that is not controlled with medicine. °· You have drainage, blood, or pus coming from the wound that lasts longer than 1 day. °· You develop muscle aches, chills, or a general ill feeling. °· You notice a bad smell coming from the wound or dressing. °· Your wound edges separate after the sutures, staples, or skin adhesive strips have been removed. °· You develop persistent nausea or vomiting. °SEEK IMMEDIATE MEDICAL CARE IF:  °· You have a fever. °· You develop a rash. °· You develop dizzy episodes or faint while standing. °· You have difficulty breathing. °· You develop any reaction or side effects to medicine given. °MAKE SURE YOU:  °· Understand these instructions. °· Will watch your condition. °· Will get help  right away if you are not doing well or get worse. °Document Released: 01/20/2005 Document Revised: 09/25/2011 Document Reviewed: 08/27/2013 °ExitCare® Patient Information ©2015 [employer]. This information is not intended to replace advice given to you by your health care provider. Make sure you discuss any questions you have with your health care provider. ° ° °

## 2014-03-25 NOTE — Progress Notes (Signed)
PROCEDURE NOTE: Nexplanon Removal  Patient given informed consent for removal of nexplanon including risks, benefits and alternatives to procedures. All questions answered.  Signed copy in the chart.  Appropriate time out taken.  Nexplanon site identified in left upper arm.  Area prepped in usual sterile fashon. One cc of 1% lidocaine was used to anesthetize the area at the distal end of the implant. A small stab incision was made right beside the implant on the distal portion. The nexplanon rod was grasped using hemostats and removed without difficulty. There was less than 3 cc blood loss. There were no complications. A small amount of antibiotic ointment and steri-strips were applied over the small incision. A pressure bandage was applied to reduce any bruising. The patient tolerated the procedure well and was given post procedure instructions.  Procedure was supervised by Dr. Andria Frames.  Michelle Kelso B. Bonner Puna, MD, PGY-2 03/25/2014 10:15 AM

## 2014-05-01 ENCOUNTER — Emergency Department (HOSPITAL_COMMUNITY): Payer: Medicaid Other

## 2014-05-01 ENCOUNTER — Emergency Department (HOSPITAL_COMMUNITY)
Admission: EM | Admit: 2014-05-01 | Discharge: 2014-05-01 | Disposition: A | Discharge status: Home / Self Care | Payer: Medicaid Other | Attending: Emergency Medicine | Admitting: Emergency Medicine

## 2014-05-01 ENCOUNTER — Encounter (HOSPITAL_COMMUNITY): Payer: Self-pay | Admitting: Emergency Medicine

## 2014-05-01 DIAGNOSIS — Y9289 Other specified places as the place of occurrence of the external cause: Secondary | ICD-10-CM | POA: Insufficient documentation

## 2014-05-01 DIAGNOSIS — Z8632 Personal history of gestational diabetes: Secondary | ICD-10-CM | POA: Insufficient documentation

## 2014-05-01 DIAGNOSIS — F4310 Post-traumatic stress disorder, unspecified: Secondary | ICD-10-CM | POA: Insufficient documentation

## 2014-05-01 DIAGNOSIS — Z720 Tobacco use: Secondary | ICD-10-CM | POA: Diagnosis not present

## 2014-05-01 DIAGNOSIS — Z792 Long term (current) use of antibiotics: Secondary | ICD-10-CM | POA: Diagnosis not present

## 2014-05-01 DIAGNOSIS — S99922A Unspecified injury of left foot, initial encounter: Secondary | ICD-10-CM | POA: Diagnosis present

## 2014-05-01 DIAGNOSIS — Z872 Personal history of diseases of the skin and subcutaneous tissue: Secondary | ICD-10-CM | POA: Diagnosis not present

## 2014-05-01 DIAGNOSIS — S93602A Unspecified sprain of left foot, initial encounter: Secondary | ICD-10-CM | POA: Diagnosis not present

## 2014-05-01 DIAGNOSIS — S99912A Unspecified injury of left ankle, initial encounter: Secondary | ICD-10-CM | POA: Diagnosis not present

## 2014-05-01 DIAGNOSIS — Z8742 Personal history of other diseases of the female genital tract: Secondary | ICD-10-CM | POA: Diagnosis not present

## 2014-05-01 DIAGNOSIS — F319 Bipolar disorder, unspecified: Secondary | ICD-10-CM | POA: Diagnosis not present

## 2014-05-01 DIAGNOSIS — Z8669 Personal history of other diseases of the nervous system and sense organs: Secondary | ICD-10-CM | POA: Insufficient documentation

## 2014-05-01 DIAGNOSIS — Z79899 Other long term (current) drug therapy: Secondary | ICD-10-CM | POA: Insufficient documentation

## 2014-05-01 DIAGNOSIS — W010XXA Fall on same level from slipping, tripping and stumbling without subsequent striking against object, initial encounter: Secondary | ICD-10-CM | POA: Diagnosis not present

## 2014-05-01 DIAGNOSIS — Y9389 Activity, other specified: Secondary | ICD-10-CM | POA: Diagnosis not present

## 2014-05-01 DIAGNOSIS — Z8679 Personal history of other diseases of the circulatory system: Secondary | ICD-10-CM | POA: Diagnosis not present

## 2014-05-01 MED ORDER — HYDROCODONE-ACETAMINOPHEN 5-325 MG PO TABS: 1.0000 | ORAL_TABLET | Freq: Four times a day (QID) | ORAL | Status: DC | PRN

## 2014-05-01 MED ORDER — HYDROCODONE-ACETAMINOPHEN 5-325 MG PO TABS
2.0000 | ORAL_TABLET | Freq: Once | ORAL | Status: AC
  Administered 2014-05-01: 2 via ORAL
  Filled 2014-05-01: qty 2

## 2014-05-01 NOTE — ED Notes (Signed)
The pt fell over a baby gate and injured her lt foot with swelling laterally.  lmp  none

## 2014-05-01 NOTE — Discharge Instructions (Signed)
Foot Sprain The muscles and cord like structures which attach muscle to bone (tendons) that surround the feet are made up of units. A foot sprain can occur at the weakest spot in any of these units. This condition is most often caused by injury to or overuse of the foot, as from playing contact sports, or aggravating a previous injury, or from poor conditioning, or obesity. SYMPTOMS  Pain with movement of the foot.  Tenderness and swelling at the injury site.  Loss of strength is present in moderate or severe sprains. THE THREE GRADES OR SEVERITY OF FOOT SPRAIN ARE:  Mild (Grade I): Slightly pulled muscle without tearing of muscle or tendon fibers or loss of strength.  Moderate (Grade II): Tearing of fibers in a muscle, tendon, or at the attachment to bone, with small decrease in strength.  Severe (Grade III): Rupture of the muscle-tendon-bone attachment, with separation of fibers. Severe sprain requires surgical repair. Often repeating (chronic) sprains are caused by overuse. Sudden (acute) sprains are caused by direct injury or over-use. DIAGNOSIS  Diagnosis of this condition is usually by your own observation. If problems continue, a caregiver may be required for further evaluation and treatment. X-rays may be required to make sure there are not breaks in the bones (fractures) present. Continued problems may require physical therapy for treatment. PREVENTION  Use strength and conditioning exercises appropriate for your sport.  Warm up properly prior to working out.  Use athletic shoes that are made for the sport you are participating in.  Allow adequate time for healing. Early return to activities makes repeat injury more likely, and can lead to an unstable arthritic foot that can result in prolonged disability. Mild sprains generally heal in 3 to 10 days, with moderate and severe sprains taking 2 to 10 weeks. Your caregiver can help you determine the proper time required for  healing. HOME CARE INSTRUCTIONS   Apply ice to the injury for 15-20 minutes, 03-04 times per day. Put the ice in a plastic bag and place a towel between the bag of ice and your skin.  An elastic wrap (like an Ace bandage) may be used to keep swelling down.  Keep foot above the level of the heart, or at least raised on a footstool, when swelling and pain are present.  Try to avoid use other than gentle range of motion while the foot is painful. Do not resume use until instructed by your caregiver. Then begin use gradually, not increasing use to the point of pain. If pain does develop, decrease use and continue the above measures, gradually increasing activities that do not cause discomfort, until you gradually achieve normal use.  Use crutches if and as instructed, and for the length of time instructed.  Keep injured foot and ankle wrapped between treatments.  Massage foot and ankle for comfort and to keep swelling down. Massage from the toes up towards the knee.  Only take over-the-counter or prescription medicines for pain, discomfort, or fever as directed by your caregiver. SEEK IMMEDIATE MEDICAL CARE IF:   Your pain and swelling increase, or pain is not controlled with medications.  You have loss of feeling in your foot or your foot turns cold or blue.  You develop new, unexplained symptoms, or an increase of the symptoms that brought you to your caregiver. MAKE SURE YOU:   Understand these instructions.  Will watch your condition.  Will get help right away if you are not doing well or get worse. Document Released:   12/23/2001 Document Revised: 09/25/2011 Document Reviewed: 02/20/2008 ExitCare Patient Information 2015 [employer]. This information is not intended to replace advice given to you by your health care provider. Make sure you discuss any questions you have with your health care provider.  

## 2014-05-01 NOTE — ED Notes (Signed)
Patient transported to X-ray 

## 2014-05-01 NOTE — ED Provider Notes (Signed)
CSN: [id number]     Arrival date & time 05/01/14  1731 History  This chart was scribed for non-physician practitioner, Montine Circle, PA-C working with Orpah Greek, * by Judithann Sauger, ED Scribe. The patient was seen in room TR04C/TR04C and the patient's care was started at 6:00 PM   No chief complaint on file.  The history is provided by the patient. No language interpreter was used.   HPI Comments: Michelle [name] is a 23 y.o. female who presents to the Emergency Department complaining of constant, moderate, left foot and ankle pain that started today after falling. She states that she tripped over a baby gate. She complains of pain with palpation and movement. She reports moderate associated swelling. She has not tried taking anything to alleviate the symptoms.   Past Medical History  Diagnosis Date  . Tobacco abuse   . Menorrhagia   . 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 H/O eclampsia     "son was born @ 33wk because of it" (07/25/2012)  . Seizures 06/20/2012    "had one when they pulled baby out via C-section" (07/25/2012)  . Migraines   . Bipolar I disorder   . PTSD (post-traumatic stress disorder)     "started back when I was 23 yr old" (07/25/2012)  . Bipolar disorder   . Eclampsia 06/19/2012  . Gestational diabetes 2007; 2008  . Gestational hypertension     "back to normalish 2 wk after son born" (07/25/2012)  . Onychia and paronychia of toe 11/04/2013   Past Surgical History  Procedure Laterality Date  . Cesarean section  06/20/2012    Procedure: CESAREAN SECTION;  Surgeon: Emily Filbert, MD;  Location: [facility];  Service: Obstetrics;  Laterality: N/A;  . Appendectomy  07/25/2012  . Adenoidectomy, tonsillectomy and myringotomy with tube placement  1990's  . Laparoscopic appendectomy  07/25/2012    Procedure: APPENDECTOMY LAPAROSCOPIC;  Surgeon: Zenovia Jarred, MD;  Location: [facility];  Service: General;  Laterality: N/A;   Family History  Problem  Relation Age of Onset  . Breast cancer Paternal Grandmother   . Colon cancer Paternal Grandfather   . Colon cancer Maternal Grandfather   . Colon polyps Maternal Aunt   . Diabetes Mother     maternal aunts and uncles  . Diabetes Sister   . Diabetes Father   . Heart disease Father    History  Substance Use Topics  . Smoking status: Current Every Day Smoker -- 0.25 packs/day for 14 years    Types: Cigarettes  . Smokeless tobacco: Never Used  . Alcohol Use: No     Comment: 07/25/2012 "New Year's or other occasions drinker"   OB History   Grav Para Term Preterm Abortions TAB SAB Ect Mult Living   4 3 2 1      3      Obstetric Comments   C-Section Indication: Non-reassuring fetal tracing, growth delay & marked proteinuria & Pre-Eclampsia Eclamptic Seizure during C-section delivery     Review of Systems  Constitutional: Negative for fever and chills.  Respiratory: Negative for shortness of breath.   Cardiovascular: Negative for chest pain.  Gastrointestinal: Negative for nausea, vomiting, diarrhea and constipation.  Genitourinary: Negative for dysuria.  Musculoskeletal: Positive for arthralgias and joint swelling.  All other systems reviewed and are negative.     Allergies  Codeine  Home Medications   Prior to Admission medications   Medication Sig Start Date  End Date Taking? Authorizing Provider  albuterol (PROVENTIL HFA;VENTOLIN HFA) 108 (90 BASE) MCG/ACT inhaler Inhale 2 puffs into the lungs every 4 (four) hours as needed for wheezing or shortness of breath. 01/28/14   Patrecia Pour, MD  cephALEXin (KEFLEX) 500 MG capsule Take 1 capsule (500 mg total) by mouth 4 (four) times daily. 02/21/14   Montine Circle, PA-C  ciclopirox (PENLAC) 8 % solution Apply topically at bedtime. Apply over nail and surrounding skin. Apply daily over previous coat. After seven (7) days, may remove with alcohol and continue cycle. 12/31/13   Myeong Sheard, DPM  lurasidone (LATUDA) 40 MG TABS tablet  Take 1 tablet (40 mg total) by mouth at bedtime. 01/28/14   Patrecia Pour, MD  oxyCODONE-acetaminophen (PERCOCET/ROXICET) 5-325 MG per tablet Take 1 tablet by mouth every 6 (six) hours as needed for severe pain. 02/21/14   Montine Circle, PA-C  venlafaxine XR (EFFEXOR XR) 37.5 MG 24 hr capsule Take 1 capsule (37.5 mg total) by mouth daily with breakfast. 03/02/14   Patrecia Pour, MD   BP 120/77  Pulse 121  Temp(Src) 98.2 F (36.8 C) (Oral)  Resp 16  SpO2 96% Physical Exam  Nursing note and vitals reviewed. Constitutional: She is oriented to person, place, and time. She appears well-developed and well-nourished. No distress.  HENT:  Head: Normocephalic and atraumatic.  Eyes: Conjunctivae and EOM are normal.  Neck: Normal range of motion. Neck supple. No tracheal deviation present.  Cardiovascular: Normal rate and intact distal pulses.   Intact distal pulses with brisk capillary refill  Pulmonary/Chest: Effort normal. No respiratory distress.  Abdominal: She exhibits no distension.  Musculoskeletal: Normal range of motion.  Left foot and ankle range of motion and strength limited secondary to pain, no bony abnormality or deformity  Neurological: She is alert and oriented to person, place, and time.  Sensation intact  Skin: Skin is warm and dry.  Psychiatric: She has a normal mood and affect. Her behavior is normal. Judgment and thought content normal.    ED Course  Procedures (including critical care time) DIAGNOSTIC STUDIES: Oxygen Saturation is 96% on RA, adequate by my interpretation.    COORDINATION OF CARE: 6:01 PM- Pt advised of plan for treatment and pt agrees.  Labs Review Labs Reviewed - No data to display  Imaging Review Dg Ankle Complete Left  05/01/2014   CLINICAL DATA:  Tripped and fell over a child safety gait. Lateral pain and swelling.  EXAM: LEFT ANKLE COMPLETE - 3+ VIEW  COMPARISON:  10/27/2011  FINDINGS: No evidence of fracture dislocation.  IMPRESSION: Negative    Electronically Signed   By: Nelson Chimes M.D.   On: 05/01/2014 20:10   Dg Foot Complete Left  05/01/2014   CLINICAL DATA:  Tripped and fell over a baby Gait. Lateral pain extending up to the ankle  EXAM: LEFT FOOT - COMPLETE 3+ VIEW  COMPARISON:  None.  FINDINGS: There is no evidence of fracture or dislocation. There is no evidence of arthropathy or other focal bone abnormality. Soft tissues are unremarkable.  IMPRESSION: Normal radiographs   Electronically Signed   By: Nelson Chimes M.D.   On: 05/01/2014 20:09     EKG Interpretation None      MDM   Final diagnoses:  Foot sprain, left, initial encounter    Patient with left foot sprain. Plain films are negative. Discharged to home with ankle brace, crutches, and pain medicine. Recommend orthopedic followup. Patient understands and agrees with the  plan. She is stable and ready for discharge.  I personally performed the services described in this documentation, which was scribed in my presence. The recorded information has been reviewed and is accurate.  Filed Vitals:   05/01/14 2030  BP: 108/62  Pulse: 96  Temp:   Resp: [address] PA-C 05/01/14 2022  Montine Circle, PA-C 05/01/14 2036

## 2014-05-01 NOTE — ED Notes (Signed)
PA at BS.  

## 2014-05-02 NOTE — ED Provider Notes (Signed)
Medical screening examination/treatment/procedure(s) were performed by non-physician practitioner and as supervising physician I was immediately available for consultation/collaboration.  Orpah Greek, MD 05/02/14 [phone]

## 2014-05-04 ENCOUNTER — Telehealth: Payer: Self-pay | Admitting: Family Medicine

## 2014-05-04 NOTE — Telephone Encounter (Signed)
Please let pt know she needs to be evaluated before we can prescribe anything. Could she be seen tomorrow AM?

## 2014-05-04 NOTE — Telephone Encounter (Signed)
Pt seen this past Friday in the ED for a sprain in her foot, was prescribed enough pain meds until today but pt could not get in to see the ortho doctor until Wednesday, wants to know if her doctor here could prescribe her enough to get through until her appt Wednesday. Says if she needs to be seen again here she will. (No appts available today)

## 2014-05-05 ENCOUNTER — Ambulatory Visit (INDEPENDENT_AMBULATORY_CARE_PROVIDER_SITE_OTHER): Payer: Medicaid Other | Admitting: Family Medicine

## 2014-05-05 ENCOUNTER — Encounter: Payer: Self-pay | Admitting: Family Medicine

## 2014-05-05 VITALS — BP 136/88 | HR 124 | Temp 98.1°F | Ht 62.5 in | Wt 194.0 lb

## 2014-05-05 DIAGNOSIS — S93602D Unspecified sprain of left foot, subsequent encounter: Secondary | ICD-10-CM

## 2014-05-05 DIAGNOSIS — S93602A Unspecified sprain of left foot, initial encounter: Secondary | ICD-10-CM | POA: Insufficient documentation

## 2014-05-05 MED ORDER — OXYCODONE-ACETAMINOPHEN 5-325 MG PO TABS: 1.0000 | ORAL_TABLET | ORAL | Status: DC | PRN

## 2014-05-05 NOTE — Progress Notes (Signed)
   Subjective:    Patient ID: Michelle [name], female    DOB: 11-Jun-1991, 23 y.o.   MRN: [id number]  Patient presents for a same day appointment.  HPI  SPRAINED LEFT ANKLE: - Recently evaluated in ED on 05/01/14 for same complaint, Left Foot pain - Reported that she "tripped and fell over a baby gate" on Friday 05/01/14 and experienced immediate severe left foot pain and swelling at that time. Went to ED, evaluated with X-rays (Left ankle and Left foot) which showed no acute abnormalities or fractures. She was told that she has a "severe left foot sprain" and was referred to Orthopedic surgery this week (apt scheduled for tomorrow 05/06/14), given Vicodin 5/325, crutches, and ankle support for pain - Currently reports persistent pain in left foot at rest 7/10 throbbing pain, significantly worse with moving using crutches "excrutiating pain" 10/10, had been taking Vicodin without any relief. Swelling seems to have improved with rest and elevation. Requesting something stronger for pain to get her to the Ortho appointment, previously tolerated Percocet well for dental pain, admits hx codeine allergy but unsure of reaction and nothing recently.   - Denies any weakness, numbness, or tingling in left foot, persistent swelling, redness, pain or swelling in other joints.  I have reviewed and updated the following as appropriate: allergies and current medications  Social Hx: - Active smoker  Review of Systems  See above HPI    Objective:   Physical Exam  BP 136/88  Pulse 124  Temp(Src) 98.1 F (36.7 C) (Oral)  Ht 5' 2.5" (1.588 m)  Wt 194 lb (87.998 kg)  BMI 34.90 kg/m2  Gen - well-appearing, with crutches, left lower ext propped up on chair by side, wearing left ankle support brace, NAD MSK / EXT: Left Foot - mild edema compared to right, tender to palpation lateral aspect of dorsal mid foot, mild pain on plantar aspect of foot. Left ankle and calf non-tender to palpation. Ankle inversion  and plantarflexion intact ROM and muscle strength, otherwise increased pain with ankle eversion and dorsiflexion. Peripheral pulses intact +2 b/l Skin - warm, dry, no rashes Neuro - awake, alert, oriented, grossly non-focal, intact distal sensation to light touch, unable to weight bear due to pain, ambulates with crutches     Assessment & Plan:   See specific A&P problem list for details.

## 2014-05-05 NOTE — Patient Instructions (Signed)
Dear Michelle [name], Thank you for coming in to clinic today.  1. Continue with crutches, ankle support, use Percocet as needed for pain. Continue ice and elevation for swelling. 2. Follow-up with Orthopedic appointment tomorrow on Wednesday  Please schedule a follow-up appointment with Dr. Bonner Puna within 2-4 weeks if no improvement.  If you have any other questions or concerns, please feel free to call the clinic to contact me. You may also schedule an earlier appointment if necessary.  However, if your symptoms get significantly worse, please go to the Emergency Department to seek immediate medical attention.  Nobie Putnam, DO [facility]   Foot Sprain The muscles and cord like structures which attach muscle to bone (tendons) that surround the feet are made up of units. A foot sprain can occur at the weakest spot in any of these units. This condition is most often caused by injury to or overuse of the foot, as from playing contact sports, or aggravating a previous injury, or from poor conditioning, or obesity. SYMPTOMS  Pain with movement of the foot.  Tenderness and swelling at the injury site.  Loss of strength is present in moderate or severe sprains. THE THREE GRADES OR SEVERITY OF FOOT SPRAIN ARE:  Mild (Grade I): Slightly pulled muscle without tearing of muscle or tendon fibers or loss of strength.  Moderate (Grade II): Tearing of fibers in a muscle, tendon, or at the attachment to bone, with small decrease in strength.  Severe (Grade III): Rupture of the muscle-tendon-bone attachment, with separation of fibers. Severe sprain requires surgical repair. Often repeating (chronic) sprains are caused by overuse. Sudden (acute) sprains are caused by direct injury or over-use. DIAGNOSIS  Diagnosis of this condition is usually by your own observation. If problems continue, a caregiver may be required for further evaluation and treatment. X-rays may be required to  make sure there are not breaks in the bones (fractures) present. Continued problems may require physical therapy for treatment. PREVENTION  Use strength and conditioning exercises appropriate for your sport.  Warm up properly prior to working out.  Use athletic shoes that are made for the sport you are participating in.  Allow adequate time for healing. Early return to activities makes repeat injury more likely, and can lead to an unstable arthritic foot that can result in prolonged disability. Mild sprains generally heal in 3 to 10 days, with moderate and severe sprains taking 2 to 10 weeks. Your caregiver can help you determine the proper time required for healing. HOME CARE INSTRUCTIONS   Apply ice to the injury for 15-20 minutes, 03-04 times per day. Put the ice in a plastic bag and place a towel between the bag of ice and your skin.  An elastic wrap (like an Ace bandage) may be used to keep swelling down.  Keep foot above the level of the heart, or at least raised on a footstool, when swelling and pain are present.  Try to avoid use other than gentle range of motion while the foot is painful. Do not resume use until instructed by your caregiver. Then begin use gradually, not increasing use to the point of pain. If pain does develop, decrease use and continue the above measures, gradually increasing activities that do not cause discomfort, until you gradually achieve normal use.  Use crutches if and as instructed, and for the length of time instructed.  Keep injured foot and ankle wrapped between treatments.  Massage foot and ankle for comfort and to keep  swelling down. Massage from the toes up towards the knee.  Only take over-the-counter or prescription medicines for pain, discomfort, or fever as directed by your caregiver. SEEK IMMEDIATE MEDICAL CARE IF:   Your pain and swelling increase, or pain is not controlled with medications.  You have loss of feeling in your foot or your  foot turns cold or blue.  You develop new, unexplained symptoms, or an increase of the symptoms that brought you to your caregiver. MAKE SURE YOU:   Understand these instructions.  Will watch your condition.  Will get help right away if you are not doing well or get worse. Document Released: 12/23/2001 Document Revised: 09/25/2011 Document Reviewed: 02/20/2008 [facility] Patient Information 2015 [place], [place]. This information is not intended to replace advice given to you by your health care provider. Make sure you discuss any questions you have with your health care provider.

## 2014-05-05 NOTE — Assessment & Plan Note (Signed)
Consistent with severe left foot sprain following fall injury 4 days ago on 10/16, persistent pain at rest and unable to weight bear, currently using crutches for ambulation - ED evaluation on 10/16 with negative Left ankle and foot X-rays, no evidence of fractures - Has Ortho follow-up scheduled for tomorrow 05/06/14  Plan: 1. Prescribed Percocet 5/325, 1 q 4 hr PRN pain (#20, 0 refills), stop taking Vicodin 2. Continue Tylenol PRN, elevation, ice, ankle support, crutches 3. Follow-up with Ortho tomorrow 10/21 for further eval 4. RTC PRN

## 2014-05-18 ENCOUNTER — Encounter: Payer: Self-pay | Admitting: Family Medicine

## 2014-06-17 ENCOUNTER — Encounter: Payer: Self-pay | Admitting: Family Medicine

## 2014-06-17 ENCOUNTER — Ambulatory Visit (INDEPENDENT_AMBULATORY_CARE_PROVIDER_SITE_OTHER): Payer: Medicaid Other | Admitting: Family Medicine

## 2014-06-17 VITALS — BP 116/80 | HR 110 | Temp 98.5°F | Ht 62.5 in | Wt 186.9 lb

## 2014-06-17 DIAGNOSIS — Z0189 Encounter for other specified special examinations: Secondary | ICD-10-CM

## 2014-06-17 DIAGNOSIS — Z3009 Encounter for other general counseling and advice on contraception: Secondary | ICD-10-CM

## 2014-06-17 DIAGNOSIS — Z309 Encounter for contraceptive management, unspecified: Secondary | ICD-10-CM

## 2014-06-17 LAB — POCT PREGNANCY, URINE: Preg Test, Ur: NEGATIVE

## 2014-06-17 NOTE — Progress Notes (Signed)
Subjective:    Patient ID: Michelle [name], female    DOB: 02/22/1991, 23 y.o.   MRN: [id number]  HPI Consult Date: 06/17/2014  Reason for Consult: Laparoscopic Bilateral Tubal ligation Referring Physician: Dr Lytle Butte is an 23 y.o. [phone] female who was referred to our clinic for laparoscopic BTL.  Michelle [name] has three children and does not want any more children.  Michelle [name] has tried multiple forms of birth control, including OCP, nexplanon, nuvaring without satisfaction.  Michelle [name] has normal periods.  Current Outpatient Prescriptions on File Prior to Visit  Medication Sig Dispense Refill  . albuterol (PROVENTIL HFA;VENTOLIN HFA) 108 (90 BASE) MCG/ACT inhaler Inhale 2 puffs into the lungs every 4 (four) hours as needed for wheezing or shortness of breath. (Patient not taking: Reported on 06/17/2014) 6.7 g 11  . ciclopirox (PENLAC) 8 % solution Apply topically at bedtime. Apply over nail and surrounding skin. Apply daily over previous coat. After seven (7) days, may remove with alcohol and continue cycle. (Patient not taking: Reported on 06/17/2014) 6.6 mL 0  . lurasidone (LATUDA) 40 MG TABS tablet Take 1 tablet (40 mg total) by mouth at bedtime. (Patient not taking: Reported on 06/17/2014) 30 tablet 2  . venlafaxine XR (EFFEXOR XR) 37.5 MG 24 hr capsule Take 1 capsule (37.5 mg total) by mouth daily with breakfast. (Patient not taking: Reported on 06/17/2014) 30 capsule 5   No current facility-administered medications on file prior to visit.   Past Medical History  Diagnosis Date  . Tobacco abuse   . Menorrhagia   . 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 H/O eclampsia     "son was born @ 33wk because of it" (07/25/2012)  . Seizures 06/20/2012    "had one when they pulled baby out via C-section" (07/25/2012)  . Migraines   . Bipolar I disorder   . PTSD (post-traumatic stress disorder)     "started back when I was 23 yr old" (07/25/2012)  . Bipolar disorder   . Eclampsia  06/19/2012  . Gestational diabetes 2007; 2008  . Gestational hypertension     "back to normalish 2 wk after son born" (07/25/2012)  . Onychia and paronychia of toe 11/04/2013   History   Social History  . Marital Status: Legally Separated    Spouse Name: N/A    Number of Children: 2  . Years of Education: N/A   Occupational History  . Not on file.   Social History Main Topics  . Smoking status: Current Every Day Smoker -- 0.25 packs/day for 14 years    Types: Cigarettes  . Smokeless tobacco: Never Used  . Alcohol Use: No     Comment: 07/25/2012 "New Year's or other occasions drinker"  . Drug Use: No  . Sexual Activity: Yes    Birth Control/ Protection: Condom   Other Topics Concern  . Not on file   Social History Narrative   Family History  Problem Relation Age of Onset  . Breast cancer Paternal Grandmother   . Colon cancer Paternal Grandfather   . Colon cancer Maternal Grandfather   . Colon polyps Maternal Aunt   . Diabetes Mother     maternal aunts and uncles  . Diabetes Sister   . Diabetes Father   . Heart disease Father       Review of Systems  Constitutional: Negative for fever, chills and fatigue.  Respiratory: Negative for cough, wheezing and stridor.  Cardiovascular: Negative for chest pain and palpitations.  Gastrointestinal: Negative for nausea, vomiting, abdominal pain, diarrhea and constipation.  Genitourinary: Negative for dysuria, urgency, frequency, hematuria, vaginal bleeding, vaginal discharge, vaginal pain and pelvic pain.  Neurological: Negative for dizziness, seizures and headaches.  All other systems reviewed and are negative.      Objective:   Physical Exam  Constitutional: Michelle [name] is oriented to person, place, and time. Michelle [name] appears well-developed and well-nourished.  HENT:  Head: Normocephalic and atraumatic.  Cardiovascular: Normal rate, regular rhythm and normal heart sounds.  Exam reveals no gallop and no friction rub.   No murmur  heard. Pulmonary/Chest: Effort normal and breath sounds normal. No respiratory distress. Michelle [name] has no wheezes. Michelle [name] has no rales. Michelle [name] exhibits no tenderness.  Abdominal: Soft. Bowel sounds are normal. Michelle [name] exhibits no distension and no mass. There is no tenderness. There is no rebound and no guarding.  Musculoskeletal: Michelle [name] exhibits no edema or tenderness.  Neurological: Michelle [name] is alert and oriented to person, place, and time.  Psychiatric: Michelle [name] has a normal mood and affect. Her behavior is normal. Judgment and thought content normal.      Assessment & Plan:   Problem List Items Addressed This Visit    None    Visit Diagnoses    Visit for family planning education    -  Primary    Patient request for diagnostic testing          Counseled pt regarding permanent nature of BTL, including regret rate.  Also discussed risks associated with procedure, including infection, bleeding, injury to adjacent organs, and failure.  30 day papers signed today.  Will schedule procedure with Dr Roselie Awkward, who saw the patient.

## 2014-06-17 NOTE — Progress Notes (Signed)
Patient here today to discuss having BTL. Reports having nexplanon removed 3 months ago and having one very light short period lasting 2-3 days since then. Also reports having a few times in which the condom fell off-- is concerned she could be pregnant. Requests pregnant test. UPT obtained.

## 2014-06-17 NOTE — Patient Instructions (Signed)
Laparoscopic Tubal Ligation Laparoscopic tubal ligation is a procedure that closes the fallopian tubes at a time other than right after childbirth. By closing the fallopian tubes, the eggs that are released from the ovaries cannot enter the uterus and sperm cannot reach the egg. Tubal ligation is also known as getting your "tubes tied." Tubal ligation is done so you will not be able to get pregnant or have a baby.  Although this procedure may be reversed, it should be considered permanent and irreversible. If you want to have future pregnancies, you should not have this procedure.  LET YOUR CAREGIVER KNOW ABOUT:  Allergies to food or medicine.  Medicines taken, including vitamins, herbs, eyedrops, over-the-counter medicines, and creams.  Use of steroids (by mouth or creams).  Previous problems with numbing medicines.  History of bleeding problems or blood clots.  Any recent colds or infections.  Previous surgery.  Other health problems, including diabetes and kidney problems.  Possibility of pregnancy, if this applies.  Any past pregnancies. RISKS AND COMPLICATIONS   Infection.  Bleeding.  Injury to surrounding organs.  Anesthetic side effects.  Failure of the procedure.  Ectopic pregnancy.  Future regret about having the procedure done. BEFORE THE PROCEDURE  Do not take aspirin or blood thinners a week before the procedure or as directed. This can cause bleeding.  Do not eat or drink anything 6 to 8 hours before the procedure. PROCEDURE   You may be given a medicine to help you relax (sedative) before the procedure. You will be given a medicine to make you sleep (general anesthetic) during the procedure.  A tube will be put down your throat to help your breath while under general anesthesia.  Two small cuts (incisions) are made in the lower abdominal area and near the belly button.  Your abdominal area will be inflated with a safe gas (carbon dioxide). This helps  give the surgeon room to operate, visualize, and helps the surgeon avoid other organs.  A thin, lighted tube (laparoscope) with a camera attached is inserted into your abdomen through one of the incisions near the belly button. Other small instruments are also inserted through the other abdominal incision.  The fallopian tubes are located and are either blocked with a ring, clip, or are burned (cauterized).  After the fallopian tubes are blocked, the gas is released from the abdomen.  The incisions will be closed with stitches (sutures), and a bandage may be placed over the incisions. AFTER THE PROCEDURE   You will rest in a recovery room for 1--4 hours until you are stable and doing well.  You will also have some mild abdominal discomfort for 3--7 days. You will be given pain medicine to ease any discomfort.  As long as there are no problems, you may be allowed to go home. Someone will need to drive you home and be with you for at least 24 hours once home.  You may have some mild discomfort in the throat. This is from the tube placed in your throat while you were sleeping.  You may experience discomfort in the shoulder area from some trapped air between the liver and diaphragm. This sensation is normal and will slowly go away on its own. Document Released: 10/09/2000 Document Revised: 01/02/2012 Document Reviewed: 10/14/2011 [employer] Patient Information 2015 [employer]. This information is not intended to replace advice given to you by your health care provider. Make sure you discuss any questions you have with your health care provider.  

## 2014-06-29 ENCOUNTER — Encounter: Payer: Self-pay | Admitting: Family Medicine

## 2014-06-29 ENCOUNTER — Ambulatory Visit (INDEPENDENT_AMBULATORY_CARE_PROVIDER_SITE_OTHER): Payer: Medicaid Other | Admitting: Family Medicine

## 2014-06-29 VITALS — BP 117/69 | HR 112 | Temp 97.9°F | Ht 63.0 in | Wt 186.0 lb

## 2014-06-29 DIAGNOSIS — Z720 Tobacco use: Secondary | ICD-10-CM

## 2014-06-29 DIAGNOSIS — F17200 Nicotine dependence, unspecified, uncomplicated: Secondary | ICD-10-CM

## 2014-06-29 MED ORDER — VARENICLINE TARTRATE 0.5 MG X 11 & 1 MG X 42 PO MISC: ORAL | Status: DC

## 2014-06-29 NOTE — Patient Instructions (Signed)
Start the chantix starter pack whenever you feel ready. Expect some of the side effects we talked about, but these should get better as you smoke less and less. Make a follow up appointment with me within a month so we can discuss strategies and other options as well as continuing chantix.   Congratulations! Merry Christmas! - Dr. Bonner Puna

## 2014-06-29 NOTE — Progress Notes (Signed)
  Subjective:  Michelle [name] is a 23 y.o. female here for evaluation/assistance with tobacco dependence.   She would like to quit because she is "just tired of it." Age when started using tobacco on a daily basis at age 49. Number of cigarettes per day 15-20. Brand smoked: [place] red. Smokes first cigarette immediately minutes after waking. Denies waking to smoke / Smokes zero times per night.    Most recent quit attempt on chantix right after last child born, got nauseous and stopped Longest time ever been tobacco free was about 9 months when pregnant with her daughter.. Medications (NRT, bupropion, varenicline) used in past includes Chantix, patches didn't work.  Rates IMPORTANCE of quitting tobacco on 1-10 scale of 10, trying to work out and can't exercise for more than 30 min. Rates READINESS of quitting tobacco on 1-10 scale of 10. Rates CONFIDENCE of quitting tobacco on 1-10 scale of 10 . Triggers to use tobacco include boyfriend smokes and is planning on quitting cold [place]; stress, eating, "just about anything"    - Review of Systems: Per HPI. All other systems reviewed and are negative. - PMH: Reviewed; No history of seizures - Meds: Reviewed and updated Objective:  BP 117/69 mmHg  Pulse 112  Temp(Src) 97.9 F (36.6 C) (Oral)  Ht 5\' 3"  (1.6 m)  Wt 186 lb (84.369 kg)  BMI 32.96 kg/m2 Gen: Obese 23 y.o. female in NAD HEENT: MMM, EOMI, PERRL, anicteric sclerae, lower lip piercings x2 CV: RRR, no MRG, no JVD Resp: Non-labored, CTAB, no wheezes noted Ext: No edema, no cyanosis or clubbing Assessment/Plan:  [name] is a 24 y.o. female here for tobacco cessation.     See problem list for problem-specific plans.  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 Obstacles include boyfriend who will try to stop cold [place].     Initiated  varenicline tx starter pack. Patient counseled on purpose, proper use, and potential adverse effects, including GI upset, and potential change in mood.   Written information provided. Provided information on 1 800-QUIT NOW support program. F/U Clinic Visit in 3 weeks.

## 2014-06-30 NOTE — Assessment & Plan Note (Signed)
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 Obstacles include boyfriend who will try to stop cold [place].     Initiated varenicline tx starter pack. Patient counseled on purpose, proper use, and potential adverse effects, including GI upset, and potential change in mood.   Written information provided. Provided information on 1 800-QUIT NOW support program. F/U Clinic Visit in 3 weeks.

## 2014-07-03 ENCOUNTER — Other Ambulatory Visit: Payer: Self-pay | Admitting: Obstetrics & Gynecology

## 2014-07-28 NOTE — Patient Instructions (Addendum)
   Your procedure is scheduled on:  Tuesday, Jan 19  Enter through the Main Entrance of [facility] at:  1:30 PM Pick up the phone at the desk and dial [phone] and inform us of your arrival.  Please call this number if you have any problems the morning of surgery: [phone]  Remember: Do not eat food after midnight: Monday Do not drink clear liquids after: 11 AM Tuesday, day of surgery Take these medicines the morning of surgery with a SIP OF WATER:  Chantix.  Bring albuterol inhaler with you on day of surgery.  Do not wear jewelry, make-up, or FINGER nail polish No metal in your hair or on your body. Do not wear lotions, powders, perfumes.  You may wear deodorant.  Do not bring valuables to the hospital. Contacts, dentures or bridgework may not be worn into surgery.  Patients discharged on the day of surgery will not be allowed to drive home.  Home with [facility] cell [phone]

## 2014-07-29 ENCOUNTER — Encounter (HOSPITAL_COMMUNITY): Payer: Self-pay

## 2014-07-29 ENCOUNTER — Encounter (HOSPITAL_COMMUNITY)
Admission: RE | Admit: 2014-07-29 | Discharge: 2014-07-29 | Disposition: A | Discharge status: Home / Self Care | Payer: Medicaid Other | Source: Ambulatory Visit | Attending: Obstetrics & Gynecology | Admitting: Obstetrics & Gynecology

## 2014-07-29 DIAGNOSIS — Z01818 Encounter for other preprocedural examination: Secondary | ICD-10-CM | POA: Diagnosis present

## 2014-07-29 DIAGNOSIS — Z3184 Encounter for fertility preservation procedure: Secondary | ICD-10-CM | POA: Diagnosis not present

## 2014-07-29 HISTORY — DX: Depression, unspecified: F32.A

## 2014-07-29 HISTORY — DX: Anxiety disorder, unspecified: F41.9

## 2014-07-29 HISTORY — DX: Unspecified asthma, uncomplicated: J45.909

## 2014-07-29 HISTORY — DX: Major depressive disorder, single episode, unspecified: F32.9

## 2014-07-29 LAB — CBC
HCT: 42 % (ref 36.0–46.0)
Hemoglobin: 14.3 g/dL (ref 12.0–15.0)
MCH: 31.1 pg (ref 26.0–34.0)
MCHC: 34 g/dL (ref 30.0–36.0)
MCV: 91.3 fL (ref 78.0–100.0)
Platelets: 230 10*3/uL (ref 150–400)
RBC: 4.6 MIL/uL (ref 3.87–5.11)
RDW: 12.9 % (ref 11.5–15.5)
WBC: 12.5 10*3/uL — ABNORMAL HIGH (ref 4.0–10.5)

## 2014-08-03 NOTE — Anesthesia Preprocedure Evaluation (Signed)
Anesthesia Evaluation  Patient identified by MRN, date of birth, ID band Patient awake    Reviewed: Allergy & Precautions, NPO status , Patient's Chart, lab work & pertinent test results  History of Anesthesia Complications Negative for: history of anesthetic complications  Airway Mallampati: II  TM Distance: >3 FB Neck ROM: Full    Dental no notable dental hx. (+) Dental Advisory Given   Pulmonary neg pulmonary ROS, asthma , Current Smoker,  breath sounds clear to auscultation  Pulmonary exam normal       Cardiovascular negative cardio ROS  Rhythm:Regular Rate:Normal     Neuro/Psych  Headaches, Seizures - (during last C/S, thought to be eclampsia),  PSYCHIATRIC DISORDERS Anxiety Depression Bipolar Disorder    GI/Hepatic negative GI ROS, Neg liver ROS,   Endo/Other  negative endocrine ROS  Renal/GU negative Renal ROS  negative genitourinary   Musculoskeletal negative musculoskeletal ROS (+)   Abdominal   Peds negative pediatric ROS (+)  Hematology negative hematology ROS (+)   Anesthesia Other Findings   Reproductive/Obstetrics negative OB ROS                             Anesthesia Physical Anesthesia Plan  ASA: II  Anesthesia Plan: General   Post-op Pain Management:    Induction: Intravenous  Airway Management Planned: Oral ETT  Additional Equipment:   Intra-op Plan:   Post-operative Plan: Extubation in OR  Informed Consent: I have reviewed the patients History and Physical, chart, labs and discussed the procedure including the risks, benefits and alternatives for the proposed anesthesia with the patient or authorized representative who has indicated his/her understanding and acceptance.   Dental advisory given  Plan Discussed with: CRNA  Anesthesia Plan Comments:         Anesthesia Quick Evaluation

## 2014-08-04 ENCOUNTER — Ambulatory Visit (HOSPITAL_COMMUNITY)
Admission: RE | Admit: 2014-08-04 | Discharge: 2014-08-04 | Disposition: A | Discharge status: Home / Self Care | Payer: Medicaid Other | Source: Ambulatory Visit | Attending: Obstetrics & Gynecology | Admitting: Obstetrics & Gynecology

## 2014-08-04 ENCOUNTER — Encounter (HOSPITAL_COMMUNITY)
Admission: RE | Disposition: A | Discharge status: Home / Self Care | Payer: Self-pay | Source: Ambulatory Visit | Attending: Obstetrics & Gynecology

## 2014-08-04 ENCOUNTER — Ambulatory Visit (HOSPITAL_COMMUNITY): Payer: Medicaid Other | Admitting: Anesthesiology

## 2014-08-04 ENCOUNTER — Encounter (HOSPITAL_COMMUNITY): Payer: Self-pay | Admitting: Certified Registered Nurse Anesthetist

## 2014-08-04 DIAGNOSIS — F17210 Nicotine dependence, cigarettes, uncomplicated: Secondary | ICD-10-CM | POA: Diagnosis not present

## 2014-08-04 DIAGNOSIS — Z302 Encounter for sterilization: Secondary | ICD-10-CM | POA: Diagnosis not present

## 2014-08-04 DIAGNOSIS — Z79899 Other long term (current) drug therapy: Secondary | ICD-10-CM | POA: Insufficient documentation

## 2014-08-04 DIAGNOSIS — J45909 Unspecified asthma, uncomplicated: Secondary | ICD-10-CM | POA: Insufficient documentation

## 2014-08-04 DIAGNOSIS — G43909 Migraine, unspecified, not intractable, without status migrainosus: Secondary | ICD-10-CM | POA: Insufficient documentation

## 2014-08-04 DIAGNOSIS — Z641 Problems related to multiparity: Secondary | ICD-10-CM | POA: Diagnosis not present

## 2014-08-04 DIAGNOSIS — F319 Bipolar disorder, unspecified: Secondary | ICD-10-CM | POA: Insufficient documentation

## 2014-08-04 HISTORY — PX: LAPAROSCOPIC TUBAL LIGATION: SHX1937

## 2014-08-04 LAB — PREGNANCY, URINE: Preg Test, Ur: NEGATIVE

## 2014-08-04 SURGERY — LIGATION, FALLOPIAN TUBE, LAPAROSCOPIC
Anesthesia: General | Site: Abdomen | Laterality: Bilateral

## 2014-08-04 MED ORDER — ROCURONIUM BROMIDE 100 MG/10ML IV SOLN
INTRAVENOUS | Status: AC
  Filled 2014-08-04: qty 1

## 2014-08-04 MED ORDER — OXYCODONE HCL 5 MG PO TABS
ORAL_TABLET | ORAL | Status: AC
  Filled 2014-08-04: qty 1

## 2014-08-04 MED ORDER — DEXAMETHASONE SODIUM PHOSPHATE 10 MG/ML IJ SOLN
INTRAMUSCULAR | Status: DC | PRN
  Administered 2014-08-04: 4 mg via INTRAVENOUS

## 2014-08-04 MED ORDER — SODIUM CHLORIDE 0.9 % IJ SOLN
INTRAMUSCULAR | Status: AC
  Filled 2014-08-04: qty 10

## 2014-08-04 MED ORDER — ONDANSETRON HCL 4 MG/2ML IJ SOLN
INTRAMUSCULAR | Status: DC | PRN
  Administered 2014-08-04: 4 mg via INTRAVENOUS

## 2014-08-04 MED ORDER — ACETAMINOPHEN 160 MG/5ML PO SOLN
ORAL | Status: AC
  Filled 2014-08-04: qty 20.3

## 2014-08-04 MED ORDER — FENTANYL CITRATE 0.05 MG/ML IJ SOLN
INTRAMUSCULAR | Status: DC | PRN
  Administered 2014-08-04 (×3): 50 ug via INTRAVENOUS
  Administered 2014-08-04: 100 ug via INTRAVENOUS

## 2014-08-04 MED ORDER — ROCURONIUM BROMIDE 100 MG/10ML IV SOLN
INTRAVENOUS | Status: DC | PRN
  Administered 2014-08-04: 35 mg via INTRAVENOUS

## 2014-08-04 MED ORDER — KETOROLAC TROMETHAMINE 30 MG/ML IJ SOLN
INTRAMUSCULAR | Status: AC
  Filled 2014-08-04: qty 1

## 2014-08-04 MED ORDER — FENTANYL CITRATE 0.05 MG/ML IJ SOLN
25.0000 ug | INTRAMUSCULAR | Status: DC | PRN
  Administered 2014-08-04 (×2): 50 ug via INTRAVENOUS

## 2014-08-04 MED ORDER — BUPIVACAINE HCL (PF) 0.25 % IJ SOLN
INTRAMUSCULAR | Status: DC | PRN
  Administered 2014-08-04: 13 mL

## 2014-08-04 MED ORDER — ACETAMINOPHEN 325 MG PO TABS: 325.0000 mg | ORAL_TABLET | ORAL | Status: DC | PRN

## 2014-08-04 MED ORDER — LIDOCAINE HCL (CARDIAC) 20 MG/ML IV SOLN
INTRAVENOUS | Status: AC
  Filled 2014-08-04: qty 5

## 2014-08-04 MED ORDER — OXYCODONE HCL 5 MG PO TABS
5.0000 mg | ORAL_TABLET | Freq: Once | ORAL | Status: AC
  Administered 2014-08-04: 5 mg via ORAL

## 2014-08-04 MED ORDER — MIDAZOLAM HCL 2 MG/2ML IJ SOLN
INTRAMUSCULAR | Status: AC
  Filled 2014-08-04: qty 2

## 2014-08-04 MED ORDER — KETOROLAC TROMETHAMINE 30 MG/ML IJ SOLN
INTRAMUSCULAR | Status: DC | PRN
  Administered 2014-08-04: 30 mg via INTRAVENOUS

## 2014-08-04 MED ORDER — OXYCODONE-ACETAMINOPHEN 5-325 MG PO TABS: 1.0000 | ORAL_TABLET | ORAL | Status: DC | PRN

## 2014-08-04 MED ORDER — SCOPOLAMINE 1 MG/3DAYS TD PT72
MEDICATED_PATCH | TRANSDERMAL | Status: AC
  Administered 2014-08-04: 1.5 mg via TRANSDERMAL
  Filled 2014-08-04: qty 1

## 2014-08-04 MED ORDER — GLYCOPYRROLATE 0.2 MG/ML IJ SOLN
INTRAMUSCULAR | Status: AC
  Filled 2014-08-04: qty 3

## 2014-08-04 MED ORDER — FENTANYL CITRATE 0.05 MG/ML IJ SOLN
INTRAMUSCULAR | Status: AC
  Administered 2014-08-04: 50 ug via INTRAVENOUS
  Filled 2014-08-04: qty 2

## 2014-08-04 MED ORDER — GLYCOPYRROLATE 0.2 MG/ML IJ SOLN
INTRAMUSCULAR | Status: DC | PRN
  Administered 2014-08-04: 0.6 mg via INTRAVENOUS

## 2014-08-04 MED ORDER — LIDOCAINE HCL (CARDIAC) 20 MG/ML IV SOLN
INTRAVENOUS | Status: DC | PRN
  Administered 2014-08-04: 80 mg via INTRAVENOUS

## 2014-08-04 MED ORDER — NEOSTIGMINE METHYLSULFATE 10 MG/10ML IV SOLN
INTRAVENOUS | Status: DC | PRN
  Administered 2014-08-04: 4 mg via INTRAVENOUS

## 2014-08-04 MED ORDER — NEOSTIGMINE METHYLSULFATE 10 MG/10ML IV SOLN
INTRAVENOUS | Status: AC
Start: 2014-08-04 — End: 2014-08-04
  Filled 2014-08-04: qty 1

## 2014-08-04 MED ORDER — ACETAMINOPHEN 160 MG/5ML PO SOLN
325.0000 mg | ORAL | Status: DC | PRN
  Administered 2014-08-04: 650 mg via ORAL

## 2014-08-04 MED ORDER — LACTATED RINGERS IV SOLN
INTRAVENOUS | Status: DC
  Administered 2014-08-04 (×3): via INTRAVENOUS

## 2014-08-04 MED ORDER — OXYCODONE-ACETAMINOPHEN 5-325 MG PO TABS: 1.0000 | ORAL_TABLET | Freq: Four times a day (QID) | ORAL | Status: DC | PRN

## 2014-08-04 MED ORDER — DEXAMETHASONE SODIUM PHOSPHATE 4 MG/ML IJ SOLN
INTRAMUSCULAR | Status: AC
  Filled 2014-08-04: qty 1

## 2014-08-04 MED ORDER — LACTATED RINGERS IV SOLN
Freq: Once | INTRAVENOUS | Status: AC
  Administered 2014-08-04 (×2): via INTRAVENOUS

## 2014-08-04 MED ORDER — ONDANSETRON HCL 4 MG/2ML IJ SOLN
INTRAMUSCULAR | Status: AC
  Filled 2014-08-04: qty 2

## 2014-08-04 MED ORDER — SCOPOLAMINE 1 MG/3DAYS TD PT72
1.0000 | MEDICATED_PATCH | Freq: Once | TRANSDERMAL | Status: DC
  Administered 2014-08-04: 1.5 mg via TRANSDERMAL

## 2014-08-04 MED ORDER — ONDANSETRON HCL 4 MG/2ML IJ SOLN: 4.0000 mg | Freq: Once | INTRAMUSCULAR | Status: DC | PRN

## 2014-08-04 MED ORDER — PROPOFOL 10 MG/ML IV BOLUS
INTRAVENOUS | Status: DC | PRN
  Administered 2014-08-04: 160 mg via INTRAVENOUS

## 2014-08-04 MED ORDER — PROPOFOL 10 MG/ML IV BOLUS
INTRAVENOUS | Status: AC
  Filled 2014-08-04: qty 20

## 2014-08-04 MED ORDER — FENTANYL CITRATE 0.05 MG/ML IJ SOLN
INTRAMUSCULAR | Status: AC
  Filled 2014-08-04: qty 5

## 2014-08-04 MED ORDER — MIDAZOLAM HCL 2 MG/2ML IJ SOLN
INTRAMUSCULAR | Status: DC | PRN
  Administered 2014-08-04: 2 mg via INTRAVENOUS

## 2014-08-04 SURGICAL SUPPLY — 23 items
BENZOIN TINCTURE PRP APPL 2/3 (GAUZE/BANDAGES/DRESSINGS) IMPLANT
CATH ROBINSON RED A/P 16FR (CATHETERS) ×3 IMPLANT
CHLORAPREP W/TINT 26ML (MISCELLANEOUS) ×3 IMPLANT
CLIP FILSHIE TUBAL LIGA STRL (Clip) ×3 IMPLANT
CLOTH BEACON ORANGE TIMEOUT ST (SAFETY) ×3 IMPLANT
DRESSING OPSITE X SMALL 2X3 (GAUZE/BANDAGES/DRESSINGS) ×3 IMPLANT
DRSG COVADERM PLUS 2X2 (GAUZE/BANDAGES/DRESSINGS) ×6 IMPLANT
DRSG OPSITE POSTOP 3X4 (GAUZE/BANDAGES/DRESSINGS) ×3 IMPLANT
GLOVE BIO SURGEON STRL SZ 6.5 (GLOVE) ×2 IMPLANT
GLOVE BIO SURGEONS STRL SZ 6.5 (GLOVE) ×1
GLOVE BIOGEL PI IND STRL 7.0 (GLOVE) ×1 IMPLANT
GLOVE BIOGEL PI INDICATOR 7.0 (GLOVE) ×2
GOWN STRL REUS W/TWL LRG LVL3 (GOWN DISPOSABLE) ×6 IMPLANT
NEEDLE INSUFFLATION 120MM (ENDOMECHANICALS) ×3 IMPLANT
PACK LAPAROSCOPY BASIN (CUSTOM PROCEDURE TRAY) ×3 IMPLANT
PAD POSITIONER PINK NONSTERILE (MISCELLANEOUS) ×3 IMPLANT
SET IRRIG TUBING LAPAROSCOPIC (IRRIGATION / IRRIGATOR) IMPLANT
SUT VICRYL 0 UR6 27IN ABS (SUTURE) ×3 IMPLANT
SUT VICRYL 4-0 PS2 18IN ABS (SUTURE) ×3 IMPLANT
TOWEL OR 17X24 6PK STRL BLUE (TOWEL DISPOSABLE) ×6 IMPLANT
TROCAR XCEL DIL TIP R 11M (ENDOMECHANICALS) ×3 IMPLANT
WARMER LAPAROSCOPE (MISCELLANEOUS) ×3 IMPLANT
WATER STERILE IRR 1000ML POUR (IV SOLUTION) ×3 IMPLANT

## 2014-08-04 NOTE — Anesthesia Procedure Notes (Signed)
Procedure Name: Intubation Date/Time: 08/04/2014 4:33 PM Performed by: Raenette Rover Pre-anesthesia Checklist: Patient identified, Emergency Drugs available, Suction available and Patient being monitored Patient Re-evaluated:Patient Re-evaluated prior to inductionOxygen Delivery Method: Circle system utilized Preoxygenation: Pre-oxygenation with 100% oxygen Intubation Type: IV induction Ventilation: Mask ventilation without difficulty Laryngoscope Size: Mac and 3 Grade View: Grade I Tube type: Oral Tube size: 7.0 mm Number of attempts: 1 Placement Confirmation: ETT inserted through vocal cords under direct vision,  positive ETCO2,  CO2 detector and breath sounds checked- equal and bilateral Secured at: 21 cm Tube secured with: Tape Dental Injury: Teeth and Oropharynx as per pre-operative assessment

## 2014-08-04 NOTE — Transfer of Care (Signed)
Immediate Anesthesia Transfer of Care Note  Patient: Michelle [name]  Procedure(s) Performed: Procedure(s): BILATERAL LAPAROSCOPIC TUBAL LIGATION (Bilateral)  Patient Location: PACU  Anesthesia Type:General  Level of Consciousness: awake, alert , oriented and patient cooperative  Airway & Oxygen Therapy: Patient Spontanous Breathing and Patient connected to nasal cannula oxygen  Post-op Assessment: Report given to PACU RN and Post -op Vital signs reviewed and stable  Post vital signs: Reviewed and stable  Complications: No apparent anesthesia complications

## 2014-08-04 NOTE — Discharge Instructions (Signed)
DISCHARGE INSTRUCTIONS: Laparoscopy  The following instructions have been prepared to help you care for yourself upon your return home today.  May take Ibuprofen products after 10:51 pm as needed for pain.  May remove Scop patch on or before 08/06/2014.  Wound care:  Do not get the incision wet for the first 24 hours. The incision should be kept clean and dry.  The Band-Aids or dressings may be removed the day after surgery.  Should the incision become sore, red, and swollen after the first week, check with your doctor.  Personal hygiene:  Shower the day after your procedure.  Activity and limitations:  Do NOT drive or operate any equipment today.  Do NOT lift anything more than 15 pounds for 2-3 weeks after surgery.  Do NOT rest in bed all day.  Walking is encouraged. Walk each day, starting slowly with 5-minute walks 3 or 4 times a day. Slowly increase the length of your walks.  Walk up and down stairs slowly.  Do NOT do strenuous activities, such as golfing, playing tennis, bowling, running, biking, weight lifting, gardening, mowing, or vacuuming for 2-4 weeks. Ask your doctor when it is okay to start.  Diet: Eat a light meal as desired this evening. You may resume your usual diet tomorrow.  Return to work: This is dependent on the type of work you do. For the most part you can return to a desk job within a week of surgery. If you are more active at work, please discuss this with your doctor.  What to expect after your surgery: You may have a slight burning sensation when you urinate on the first day. You may have a very small amount of blood in the urine. Expect to have a small amount of vaginal discharge/light bleeding for 1-2 weeks. It is not unusual to have abdominal soreness and bruising for up to 2 weeks. You may be tired and need more rest for about 1 week. You may experience shoulder pain for 24-72 hours. Lying flat in bed may relieve it.  Call your doctor for any  of the following:  Develop a fever of 100.4 or greater  Inability to urinate 6 hours after discharge from hospital  Severe pain not relieved by pain medications  Persistent of heavy bleeding at incision site  Redness or swelling around incision site after a week  Increasing nausea or vomiting

## 2014-08-04 NOTE — Op Note (Signed)
Diagnostic Laparoscopy Procedure Note Tubal ligation Indications: The patient is a 24 y.o. female with undesired fertility.  Pre-operative Diagnosis: Undesired fertility  Post-operative Diagnosis: Same  Surgeon: ARNOLD,JAMES   Assistants: NoneGeneral endotracheal anesthesia  Anesthesia: General endotracheal anesthesia  ASA Class: 2  Procedure Details  The patient was seen in the Holding Room. The risks, benefits, complications, treatment options, and expected outcomes were discussed with the patient. The possibilities of reaction to medication, pulmonary aspiration, perforation of viscus, bleeding, recurrent infection, the need for additional procedures, failure to diagnose a condition, and creating a complication requiring transfusion or operation were discussed with the patient. The patient concurred with the proposed plan, giving informed consent. The patient was taken to the Operating Room, identified as Michelle [name] and the procedure verified as Diagnostic Laparoscopy. A Time Out was held and the above information confirmed.  After induction of general anesthesia, the patient was placed in modified dorsal lithotomy position where she was prepped, draped, and catheterized in the normal, sterile fashion.  The cervix was visualized and an intrauterine manipulator was placed. A 1.5 cm umbilical incision was then performed. Veress needle was passed and pneumoperitoneum was established. A 12 mm trocar was placed through the incision and operative laparoscope was inserted with video camera and use. The uterus was manipulated with the Hulka tenaculum. Uterus tubes and ovaries appeared normal. A Filshie clip was applied to the left fallopian tube with good placement seen. Same was done on the right. Half percent Marcaine was drizzled over both tubes. Allis was removed and CO2 was released. Fascia was closed with a 0 Vicryl suture. Skin was closed with interrupted subcuticular sutures of 4-0  Vicryl. Sterile dressing was applied. She tolerated the procedure well without complications and was brought in stable condition to PACU Following the procedure the umbilical sheath was removed after intra-abdominal carbon dioxide was expressed. The incision was closed with subcutaneous and subcuticular sutures of 4-0 Vicryl. The intrauterine manipulator was then removed.  Instrument, sponge, and needle counts were correct prior to abdominal closure and at the conclusion of the case.     Estimated Blood Loss:  Minimal         Drains: none         Total IV Fluids: 1000 mL         Specimens: none              Complications:  None; patient tolerated the procedure well.         Disposition: PACU - hemodynamically stable.         Condition: stable  Dr. Emeterio Reeve 08/04/2014 5:08 PM
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
## 2014-08-04 NOTE — Anesthesia Postprocedure Evaluation (Signed)
  Anesthesia Post Note  Patient: Michelle [name]  Procedure(s) Performed: Procedure(s) (LRB): BILATERAL LAPAROSCOPIC TUBAL LIGATION (Bilateral)  Anesthesia type: GA  Patient location: PACU  Post pain: Pain level controlled  Post assessment: Post-op Vital signs reviewed  Last Vitals:  Filed Vitals:   08/04/14 1325  BP: 110/73  Pulse: 105  Temp: 36.8 C  Resp: 16    Post vital signs: Reviewed  Level of consciousness: sedated  Complications: No apparent anesthesia complications

## 2014-08-05 ENCOUNTER — Encounter (HOSPITAL_COMMUNITY): Payer: Self-pay | Admitting: Obstetrics & Gynecology

## 2014-09-09 ENCOUNTER — Encounter: Payer: Self-pay | Admitting: *Deleted

## 2014-09-28 ENCOUNTER — Emergency Department (HOSPITAL_COMMUNITY)
Admission: EM | Admit: 2014-09-28 | Discharge: 2014-09-28 | Disposition: A | Discharge status: Home / Self Care | Payer: Medicaid Other | Attending: Emergency Medicine | Admitting: Emergency Medicine

## 2014-09-28 ENCOUNTER — Encounter (HOSPITAL_COMMUNITY): Payer: Self-pay | Admitting: Emergency Medicine

## 2014-09-28 DIAGNOSIS — Y998 Other external cause status: Secondary | ICD-10-CM | POA: Insufficient documentation

## 2014-09-28 DIAGNOSIS — Z8669 Personal history of other diseases of the nervous system and sense organs: Secondary | ICD-10-CM | POA: Diagnosis not present

## 2014-09-28 DIAGNOSIS — Y9389 Activity, other specified: Secondary | ICD-10-CM | POA: Diagnosis not present

## 2014-09-28 DIAGNOSIS — Z79899 Other long term (current) drug therapy: Secondary | ICD-10-CM | POA: Insufficient documentation

## 2014-09-28 DIAGNOSIS — F319 Bipolar disorder, unspecified: Secondary | ICD-10-CM | POA: Insufficient documentation

## 2014-09-28 DIAGNOSIS — Y9289 Other specified places as the place of occurrence of the external cause: Secondary | ICD-10-CM | POA: Diagnosis not present

## 2014-09-28 DIAGNOSIS — W260XXA Contact with knife, initial encounter: Secondary | ICD-10-CM | POA: Diagnosis not present

## 2014-09-28 DIAGNOSIS — Z23 Encounter for immunization: Secondary | ICD-10-CM | POA: Insufficient documentation

## 2014-09-28 DIAGNOSIS — Z8742 Personal history of other diseases of the female genital tract: Secondary | ICD-10-CM | POA: Diagnosis not present

## 2014-09-28 DIAGNOSIS — Z720 Tobacco use: Secondary | ICD-10-CM | POA: Diagnosis not present

## 2014-09-28 DIAGNOSIS — Z872 Personal history of diseases of the skin and subcutaneous tissue: Secondary | ICD-10-CM | POA: Diagnosis not present

## 2014-09-28 DIAGNOSIS — S61218A Laceration without foreign body of other finger without damage to nail, initial encounter: Secondary | ICD-10-CM

## 2014-09-28 DIAGNOSIS — J45909 Unspecified asthma, uncomplicated: Secondary | ICD-10-CM | POA: Insufficient documentation

## 2014-09-28 DIAGNOSIS — S61210A Laceration without foreign body of right index finger without damage to nail, initial encounter: Secondary | ICD-10-CM | POA: Diagnosis not present

## 2014-09-28 DIAGNOSIS — G43909 Migraine, unspecified, not intractable, without status migrainosus: Secondary | ICD-10-CM | POA: Insufficient documentation

## 2014-09-28 DIAGNOSIS — S6991XA Unspecified injury of right wrist, hand and finger(s), initial encounter: Secondary | ICD-10-CM | POA: Diagnosis present

## 2014-09-28 MED ORDER — TETANUS-DIPHTH-ACELL PERTUSSIS 5-2.5-18.5 LF-MCG/0.5 IM SUSP
0.5000 mL | Freq: Once | INTRAMUSCULAR | Status: AC
  Administered 2014-09-28: 0.5 mL via INTRAMUSCULAR
  Filled 2014-09-28: qty 0.5

## 2014-09-28 MED ORDER — ACETAMINOPHEN 325 MG PO TABS
325.0000 mg | ORAL_TABLET | Freq: Once | ORAL | Status: AC
  Administered 2014-09-28: 325 mg via ORAL
  Filled 2014-09-28: qty 1

## 2014-09-28 MED ORDER — TRAMADOL HCL 50 MG PO TABS
50.0000 mg | ORAL_TABLET | Freq: Once | ORAL | Status: AC
  Administered 2014-09-28: 50 mg via ORAL
  Filled 2014-09-28: qty 1

## 2014-09-28 MED ORDER — IBUPROFEN 800 MG PO TABS: 800.0000 mg | ORAL_TABLET | Freq: Three times a day (TID) | ORAL | Status: DC
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
## 2014-09-28 NOTE — ED Notes (Signed)
PA at bedside.

## 2014-09-28 NOTE — Discharge Instructions (Signed)
Take the prescribed medication as directed to help with pain.  dermabond will slough off over the next few days, try not to pull it off.  May continue showering and washing hands as normal. Follow-up with your primary care physician. Return to the ED for new or worsening symptoms.

## 2014-09-28 NOTE — ED Notes (Signed)
Pt c/o small laceration to 2nd digit on right hand with knife while doing dishes today; bleeding controlled

## 2014-09-28 NOTE — ED Notes (Signed)
RN called x 2. No response.

## 2014-10-08 ENCOUNTER — Ambulatory Visit (INDEPENDENT_AMBULATORY_CARE_PROVIDER_SITE_OTHER): Payer: Medicaid Other | Admitting: Family Medicine

## 2014-10-08 ENCOUNTER — Encounter: Payer: Self-pay | Admitting: Family Medicine

## 2014-10-08 VITALS — BP 119/81 | HR 90 | Temp 99.0°F | Ht 62.0 in | Wt 182.4 lb

## 2014-10-08 DIAGNOSIS — M542 Cervicalgia: Secondary | ICD-10-CM

## 2014-10-08 NOTE — Progress Notes (Signed)
I was preceptor the day of this visit.   

## 2014-10-08 NOTE — Progress Notes (Signed)
   Subjective:    Patient ID: Michelle [name], female    DOB: 1990-12-10, 24 y.o.   MRN: [id number]  Seen for Same day visit for   CC: Left side neck pain  She reports left-sided neck pain 2 days.  Pain is achy, crampy in nature.  Denies any trauma or previous neck injuries or surgeries.  Pain is made worse with touching, neck movements and yawning.  She has not taken any medications or tried heat or ice to alleviate the pain.  Denies any trouble swallowing or breathing.  Denies any fevers, chills, or viral symptoms.  Has history of TMJ.  No superficial lesions or rashes.   Review of Systems   See HPI for ROS. Objective:  BP 119/81 mmHg  Pulse 90  Temp(Src) 99 F (37.2 C) (Oral)  Ht 5\' 2"  (1.575 m)  Wt 182 lb 6 oz (82.725 kg)  BMI 33.35 kg/m2  LMP 10/01/2014  General: NAD Cardiac: RRR, normal heart sounds, no murmurs. 2+ radial and PT pulses bilaterally Respiratory: CTAB, normal effort Neck.  Skin without lesions or rashes; full range of motion.  Mild superficial tenderness to palpation.  No masses or cervical adenopathy noted.     Assessment & Plan:  See Problem List Documentation

## 2014-10-19 ENCOUNTER — Ambulatory Visit: Payer: Medicaid Other | Admitting: Family Medicine

## 2014-11-10 ENCOUNTER — Ambulatory Visit (INDEPENDENT_AMBULATORY_CARE_PROVIDER_SITE_OTHER): Payer: Medicaid Other | Admitting: Family Medicine

## 2014-11-10 ENCOUNTER — Encounter: Payer: Self-pay | Admitting: Family Medicine

## 2014-11-10 VITALS — BP 129/82 | HR 100 | Temp 98.3°F | Ht 62.0 in | Wt 178.0 lb

## 2014-11-10 DIAGNOSIS — IMO0002 Reserved for concepts with insufficient information to code with codable children: Secondary | ICD-10-CM | POA: Insufficient documentation

## 2014-11-10 DIAGNOSIS — L03011 Cellulitis of right finger: Secondary | ICD-10-CM | POA: Diagnosis not present

## 2014-11-10 DIAGNOSIS — S93602D Unspecified sprain of left foot, subsequent encounter: Secondary | ICD-10-CM | POA: Diagnosis not present

## 2014-11-10 MED ORDER — CEPHALEXIN 500 MG PO CAPS: 500.0000 mg | ORAL_CAPSULE | Freq: Three times a day (TID) | ORAL | Status: DC

## 2014-11-10 NOTE — Patient Instructions (Signed)
Fingertip Infection °When an infection is around the nail, it is called a paronychia. When it appears over the tip of the finger, it is called a felon. These infections are due to minor injuries or cracks in the skin. If they are not treated properly, they can lead to bone infection and permanent damage to the fingernail. °Incision and drainage is necessary if a pus pocket (an abscess) has formed. Antibiotics and pain medicine may also be needed. Keep your hand elevated for the next 2-3 days to reduce swelling and pain. If a pack was placed in the abscess, it should be removed in 1-2 days by your caregiver. Soak the finger in warm water for 20 minutes 4 times daily to help promote drainage. °Keep the hands as dry as possible. Wear protective gloves with cotton liners. See your caregiver for follow-up care as recommended.  °HOME CARE INSTRUCTIONS  °· Keep wound clean, dry and dressed as suggested by your caregiver. °· Soak in warm salt water for fifteen minutes, four times per day for bacterial infections. °· Your caregiver will prescribe an antibiotic if a bacterial infection is suspected. Take antibiotics as directed and finish the prescription, even if the problem appears to be improving before the medicine is gone. °· Only take over-the-counter or prescription medicines for pain, discomfort, or fever as directed by your caregiver. °SEEK IMMEDIATE MEDICAL CARE IF: °· There is redness, swelling, or increasing pain in the wound. °· Pus or any other unusual drainage is coming from the wound. °· An unexplained oral temperature above 102° F (38.9° C) develops. °· You notice a foul smell coming from the wound or dressing. °MAKE SURE YOU:  °· Understand these instructions. °· Monitor your condition. °· Contact your caregiver if you are getting worse or not improving. °Document Released: 08/10/2004 Document Revised: 09/25/2011 Document Reviewed: 08/06/2008 °ExitCare® Patient Information ©2015 [employer]. This  information is not intended to replace advice given to you by your health care provider. Make sure you discuss any questions you have with your health care provider. ° °

## 2014-11-10 NOTE — Assessment & Plan Note (Signed)
Appears to be resolving s/p drainage and ingrown nail removal by patient. Still some swelling, redness, tenderness. - would likely resolve without further intervention but given location/risk will treat with keflex - rtc if not improving in 2-3 days

## 2014-11-10 NOTE — Progress Notes (Signed)
   Subjective:    Patient ID: Michelle [name], female    DOB: [birth year], 24 y.o.   MRN: [id number]  HPI Pt presents with reported ingrown fingernail x3 days which she removed herself last night. She reports there was a bubble of pus on the distal ulnar corner of the 4th digit nails that popped when she was removing the ingrown portion. It remains painful but looks better than yesterday. No fever/chillls. Has h/o ingrown toenails which have been removed in clinic 6 times per her report.   Review of Systems See HPI    Objective:   Physical Exam  Constitutional: She is oriented to person, place, and time. She appears well-developed and well-nourished. No distress.  HENT:  Head: Normocephalic and atraumatic.  Eyes: Conjunctivae are normal. Right eye exhibits no discharge. Left eye exhibits no discharge.  Cardiovascular: Normal rate.   Pulmonary/Chest: Effort normal. No respiratory distress.  Abdominal: She exhibits no distension.  Musculoskeletal:       Hands: Neurological: She is alert and oriented to person, place, and time.  Skin: Skin is warm and dry. She is not diaphoretic.  Psychiatric: She has a normal mood and affect. Her behavior is normal.  Nursing note and vitals reviewed.         Assessment & Plan:

## 2014-12-29 ENCOUNTER — Encounter: Payer: Self-pay | Admitting: *Deleted

## 2015-03-05 ENCOUNTER — Other Ambulatory Visit: Payer: Self-pay | Admitting: *Deleted

## 2015-03-05 MED ORDER — ALBUTEROL SULFATE HFA 108 (90 BASE) MCG/ACT IN AERS: 2.0000 | INHALATION_SPRAY | RESPIRATORY_TRACT | Status: DC | PRN

## 2015-03-08 ENCOUNTER — Inpatient Hospital Stay (HOSPITAL_COMMUNITY)
Admission: AD | Admit: 2015-03-08 | Discharge: 2015-03-08 | Disposition: A | Discharge status: Home / Self Care | Payer: Medicaid Other | Source: Ambulatory Visit | Attending: Family Medicine | Admitting: Family Medicine

## 2015-03-08 ENCOUNTER — Encounter (HOSPITAL_COMMUNITY): Payer: Self-pay | Admitting: *Deleted

## 2015-03-08 DIAGNOSIS — N644 Mastodynia: Secondary | ICD-10-CM | POA: Insufficient documentation

## 2015-03-08 DIAGNOSIS — F17200 Nicotine dependence, unspecified, uncomplicated: Secondary | ICD-10-CM | POA: Insufficient documentation

## 2015-03-08 DIAGNOSIS — F419 Anxiety disorder, unspecified: Secondary | ICD-10-CM | POA: Diagnosis not present

## 2015-03-08 DIAGNOSIS — J45909 Unspecified asthma, uncomplicated: Secondary | ICD-10-CM | POA: Insufficient documentation

## 2015-03-08 DIAGNOSIS — N6019 Diffuse cystic mastopathy of unspecified breast: Secondary | ICD-10-CM | POA: Diagnosis not present

## 2015-03-08 DIAGNOSIS — F319 Bipolar disorder, unspecified: Secondary | ICD-10-CM | POA: Insufficient documentation

## 2015-03-08 DIAGNOSIS — F4310 Post-traumatic stress disorder, unspecified: Secondary | ICD-10-CM | POA: Insufficient documentation

## 2015-03-08 DIAGNOSIS — G43909 Migraine, unspecified, not intractable, without status migrainosus: Secondary | ICD-10-CM | POA: Insufficient documentation
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
## 2015-03-08 NOTE — Discharge Instructions (Signed)

## 2015-03-08 NOTE — MAU Note (Signed)
Pt c.o  Right breast pain for the past 2 1/2 months.  Does not do Self breast exams. Tried to get in with [facility] but unable to get in for another 2 weeks.

## 2015-03-16 ENCOUNTER — Ambulatory Visit: Payer: Medicaid Other | Admitting: Family Medicine

## 2015-05-11 ENCOUNTER — Other Ambulatory Visit: Payer: Self-pay | Admitting: *Deleted

## 2015-05-11 MED ORDER — ALBUTEROL SULFATE HFA 108 (90 BASE) MCG/ACT IN AERS: 2.0000 | INHALATION_SPRAY | RESPIRATORY_TRACT | Status: DC | PRN

## 2015-06-01 ENCOUNTER — Ambulatory Visit (INDEPENDENT_AMBULATORY_CARE_PROVIDER_SITE_OTHER): Payer: Medicaid Other | Admitting: Family Medicine

## 2015-06-01 ENCOUNTER — Encounter: Payer: Self-pay | Admitting: Family Medicine

## 2015-06-01 VITALS — BP 116/60 | HR 98 | Temp 98.3°F | Ht 62.5 in | Wt 155.4 lb

## 2015-06-01 DIAGNOSIS — Z23 Encounter for immunization: Secondary | ICD-10-CM | POA: Diagnosis not present

## 2015-06-01 DIAGNOSIS — H6091 Unspecified otitis externa, right ear: Secondary | ICD-10-CM | POA: Diagnosis not present

## 2015-06-01 DIAGNOSIS — S93602D Unspecified sprain of left foot, subsequent encounter: Secondary | ICD-10-CM | POA: Diagnosis present

## 2015-06-01 MED ORDER — HYDROCORTISONE-ACETIC ACID 1-2 % OT SOLN: 3.0000 [drp] | Freq: Three times a day (TID) | OTIC | Status: DC

## 2015-06-01 NOTE — Progress Notes (Signed)
    Subjective: CC: ear pain HPI: Patient is a 24 y.o. female presenting to clinic today for same day appt. Concerns today include:  1. Ear pain Patient reports that pain started 1 week ago in her right ear.  She describes pain as a throbbing ache. She denies blood or leaking from her ear.  She denies nausea, decreased hearing, cough, congestion, rhinorrhea or fevers.  She reports baseline vertigo.  She has used peroxide which did not help.  Has taken Midol for her period, and does not think this helps with ear pain. She notes that pain progressively gets worse throughout the day.  Social History Reviewed: active smoker 5-6 cigs [employer] daily.  Currently, working on quitting. FamHx and MedHx updated.  Please see EMR. Health Maintenance: Flu shot today.  ROS: Per HPI  Objective: Office vital signs reviewed. Ht 5' 2.5" (1.588 m)  Wt 155 lb 6.4 oz (70.489 kg)  BMI 27.95 kg/m2  LMP 05/30/2015  Physical Examination:  General: Awake, alert, well nourished, No acute distress HEENT: Normal, no mastoid tenderness to palpation     Neck: No masses palpated. No lymphadenopathy, neck supple    Ears: Tympanic membranes intact, R TM with slightly dulled light reflex, no erythema, no bulging, Right external canal with some flaking and mild edematous changes    Eyes: PERRLA, EOMI    Nose: nasal turbinates moist    Throat: moist mucus membranes, no erythema Skin: dry, intact, no rashes or lesions  Assessment/ Plan: 24 y.o. female   1. External otitis of right ear.  No evidence of middle ear infection.  No systemic symptoms.  No red flags on exam. - Avoid q-tip use. - acetic acid-hydrocortisone (VOSOL-HC) otic solution; Place 3 drops into the right ear 3 (three) times daily.  Dispense: 10 mL; Refill: 0 - Return precautions reviewed, see AFTER VISIT SUMMARY. - Follow up as needed for routine care.   Michelle Norlander, DO PGY-2, [place]

## 2015-06-01 NOTE — Patient Instructions (Signed)
I suspect that you have inflammation of your external ear.  There was no evidence of inner or middle ear infection on exam.  However, if your pain worsens, you get fevers, you notice blood or pus coming from your ear, please seek immediate medical attention.  You may require an antibiotic.  In the meantime, use the ear drops that have been prescribed for you for the next week.  If things are not improving, come back for evaluation.  Otitis Externa Otitis externa is a bacterial or fungal infection of the outer ear canal. This is the area from the eardrum to the outside of the ear. Otitis externa is sometimes called "swimmer's ear." CAUSES  Possible causes of infection include:  Swimming in dirty water.  Moisture remaining in the ear after swimming or bathing.  Mild injury (trauma) to the ear.  Objects stuck in the ear (foreign body).  Cuts or scrapes (abrasions) on the outside of the ear. SIGNS AND SYMPTOMS  The first symptom of infection is often itching in the ear canal. Later signs and symptoms may include swelling and redness of the ear canal, ear pain, and yellowish-white fluid (pus) coming from the ear. The ear pain may be worse when pulling on the earlobe. DIAGNOSIS  Your health care provider will perform a physical exam. A sample of fluid may be taken from the ear and examined for bacteria or fungi. TREATMENT  Antibiotic ear drops are often given for 10 to 14 days. Treatment may also include pain medicine or corticosteroids to reduce itching and swelling. HOME CARE INSTRUCTIONS   Apply antibiotic ear drops to the ear canal as prescribed by your health care provider.  Take medicines only as directed by your health care provider.  If you have diabetes, follow any additional treatment instructions from your health care provider.  Keep all follow-up visits as directed by your health care provider. PREVENTION   Keep your ear dry. Use the corner of a towel to absorb water out of the  ear canal after swimming or bathing.  Avoid scratching or putting objects inside your ear. This can damage the ear canal or remove the protective wax that lines the canal. This makes it easier for bacteria and fungi to grow.  Avoid swimming in lakes, polluted water, or poorly chlorinated pools.  You may use ear drops made of rubbing alcohol and vinegar after swimming. Combine equal parts of white vinegar and alcohol in a bottle. Put 3 or 4 drops into each ear after swimming. SEEK MEDICAL CARE IF:   You have a fever.  Your ear is still red, swollen, painful, or draining pus after 3 days.  Your redness, swelling, or pain gets worse.  You have a severe headache.  You have redness, swelling, pain, or tenderness in the area behind your ear. MAKE SURE YOU:   Understand these instructions.  Will watch your condition.  Will get help right away if you are not doing well or get worse.   This information is not intended to replace advice given to you by your health care provider. Make sure you discuss any questions you have with your health care provider.   Document Released: 07/03/2005 Document Revised: 07/24/2014 Document Reviewed: 07/20/2011 Elsevier Interactive Patient Education [employer].

## 2015-06-02 ENCOUNTER — Telehealth: Payer: Self-pay | Admitting: Family Medicine

## 2015-06-02 NOTE — Telephone Encounter (Signed)
Cannot afford the steriod ear drop prescribed yesterday Please advise

## 2015-06-03 ENCOUNTER — Other Ambulatory Visit: Payer: Self-pay | Admitting: Family Medicine

## 2015-06-03 DIAGNOSIS — H60391 Other infective otitis externa, right ear: Secondary | ICD-10-CM

## 2015-06-03 MED ORDER — NEOMYCIN-POLYMYXIN-HC 3.5-10000-1 OT SOLN: 3.0000 [drp] | Freq: Four times a day (QID) | OTIC | Status: DC

## 2015-06-03 NOTE — Telephone Encounter (Signed)
Will forward to Dr. Lajuana Ripple who saw patient for this. Michelle [name],CMA

## 2015-06-03 NOTE — Telephone Encounter (Signed)
Cortisporin HC otic soln sent in to patient's pharmacy.  Please advise patient.

## 2015-07-27 ENCOUNTER — Telehealth: Payer: Self-pay | Admitting: Family Medicine

## 2015-07-27 NOTE — Telephone Encounter (Signed)
Needs albuterol refill.  [facility] on [address]

## 2015-07-28 MED ORDER — ALBUTEROL SULFATE HFA 108 (90 BASE) MCG/ACT IN AERS: 2.0000 | INHALATION_SPRAY | RESPIRATORY_TRACT | Status: DC | PRN

## 2015-08-04 ENCOUNTER — Encounter: Payer: Self-pay | Admitting: Family Medicine

## 2015-08-04 ENCOUNTER — Ambulatory Visit (INDEPENDENT_AMBULATORY_CARE_PROVIDER_SITE_OTHER): Payer: Medicaid Other | Admitting: Family Medicine

## 2015-08-04 VITALS — BP 110/68 | HR 67 | Temp 97.8°F | Ht 62.5 in | Wt 160.0 lb

## 2015-08-04 DIAGNOSIS — N920 Excessive and frequent menstruation with regular cycle: Secondary | ICD-10-CM

## 2015-08-04 DIAGNOSIS — S93602D Unspecified sprain of left foot, subsequent encounter: Secondary | ICD-10-CM | POA: Diagnosis not present

## 2015-08-04 LAB — CBC
HCT: 40.8 % (ref 36.0–46.0)
Hemoglobin: 13.7 g/dL (ref 12.0–15.0)
MCH: 31.1 pg (ref 26.0–34.0)
MCHC: 33.6 g/dL (ref 30.0–36.0)
MCV: 92.7 fL (ref 78.0–100.0)
MPV: 11.9 fL (ref 8.6–12.4)
Platelets: 248 10*3/uL (ref 150–400)
RBC: 4.4 MIL/uL (ref 3.87–5.11)
RDW: 12.8 % (ref 11.5–15.5)
WBC: 12.4 10*3/uL — ABNORMAL HIGH (ref 4.0–10.5)

## 2015-08-04 LAB — TSH: TSH: 0.966 u[IU]/mL (ref 0.350–4.500)

## 2015-08-04 LAB — POCT URINE PREGNANCY: Preg Test, Ur: NEGATIVE

## 2015-08-04 MED ORDER — MELOXICAM 15 MG PO TABS: 15.0000 mg | ORAL_TABLET | Freq: Every day | ORAL | Status: DC

## 2015-08-04 NOTE — Patient Instructions (Signed)
Thank you for coming in today!  We are checking some labs today, and we'll call you with those results. You will also be scheduled for an ultrasound to look for causes of the heavy bleeding. In the meantime take mobic (this was sent to your pharmacy) as directed and continue heating pads. Don't take ibuprofen or other NSAIDs while taking mobic.   If you experience and changes in symptoms please return to see me and we'll discuss our next steps.    Our clinic's number is [phone]. Feel free to call any time with questions or concerns. We will answer any questions after hours with our 24-hour emergency line at that number as well.   - Dr. Bonner Puna

## 2015-08-04 NOTE — Progress Notes (Signed)
Subjective: Michelle [name] is a 25 y.o. female presenting for heavy menses.   Had Koppel Jan 2016 at [facility] by Dr. Roselie Awkward. Starting a few months afterwards she noticed more severe cramping that debilitates her, and heavy flow (goes through a super tampon every hour for the first 3 - 4 days). Midol, tylenol and a heating pad do not seem to help very much. Periods are still reliably regular. LMP 07/24/2014. Having unprotected intercourse with same partner.   Had vaginal discharge a few weeks ago and used monistat which resolved this issue. No history of STI's or current symptoms.   - ROS: No fevers - Smoker  Objective: BP 110/68 mmHg  Pulse 67  Temp(Src) 97.8 F (36.6 C) (Oral)  Ht 5' 2.5" (1.588 m)  Wt 160 lb (72.576 kg)  BMI 28.78 kg/m2  SpO2 98%  LMP 07/25/2015 Gen: No distress CV: Regular rate, no murmur Pulm: Non-labored, clear GI: + BS; soft, non-tender, non-distended, uterus not palpable  Assessment/Plan: Michelle [name] is a 25 y.o. female here for menorrhagia.   Menorrhagia Longstanding problem previously treated with IUD. Will get repeat imaging, CBC, TSH. UPT to r/o ectopic. Recommend NSAID, mobic prescribed, heating pad. Could consider starting OCP (reluctant in smoker) vs. referral to gyn.

## 2015-08-04 NOTE — Assessment & Plan Note (Addendum)
Longstanding problem previously treated with IUD. Will get repeat imaging, CBC, TSH. UPT to r/o ectopic. Recommend NSAID, mobic prescribed, heating pad. Could consider starting OCP (reluctant in smoker) vs. referral to gyn.

## 2015-08-09 ENCOUNTER — Encounter: Payer: Self-pay | Admitting: Family Medicine

## 2015-08-10 ENCOUNTER — Other Ambulatory Visit: Payer: Self-pay | Admitting: Family Medicine

## 2015-08-10 DIAGNOSIS — N921 Excessive and frequent menstruation with irregular cycle: Secondary | ICD-10-CM

## 2015-08-11 ENCOUNTER — Ambulatory Visit (HOSPITAL_COMMUNITY)
Admission: RE | Admit: 2015-08-11 | Discharge: 2015-08-11 | Disposition: A | Discharge status: Home / Self Care | Payer: Medicaid Other | Source: Ambulatory Visit | Attending: Family Medicine | Admitting: Family Medicine

## 2015-08-11 DIAGNOSIS — N920 Excessive and frequent menstruation with regular cycle: Secondary | ICD-10-CM | POA: Diagnosis not present

## 2015-08-11 DIAGNOSIS — F17200 Nicotine dependence, unspecified, uncomplicated: Secondary | ICD-10-CM | POA: Diagnosis not present

## 2015-08-12 ENCOUNTER — Telehealth: Payer: Self-pay | Admitting: Family Medicine

## 2015-08-12 NOTE — Telephone Encounter (Signed)
U/S reviewed, significant for only small likely physiological endometrial fluid without significant fibroids or abnormally thickened endometrium.

## 2015-08-12 NOTE — Telephone Encounter (Signed)
Spoke to pt. Informed her of the the information below. She will call and make an appointment if needed. Ottis Stain, CMA

## 2015-08-18 DIAGNOSIS — D171 Benign lipomatous neoplasm of skin and subcutaneous tissue of trunk: Secondary | ICD-10-CM

## 2015-08-18 HISTORY — DX: Benign lipomatous neoplasm of skin and subcutaneous tissue of trunk: D17.1

## 2015-08-19 ENCOUNTER — Encounter: Payer: Self-pay | Admitting: Student

## 2015-08-19 ENCOUNTER — Ambulatory Visit (INDEPENDENT_AMBULATORY_CARE_PROVIDER_SITE_OTHER): Payer: Medicaid Other | Admitting: Student

## 2015-08-19 VITALS — BP 146/75 | HR 101 | Temp 98.5°F | Wt 159.0 lb

## 2015-08-19 DIAGNOSIS — K0510 Chronic gingivitis, plaque induced: Secondary | ICD-10-CM | POA: Diagnosis not present

## 2015-08-19 DIAGNOSIS — Z720 Tobacco use: Secondary | ICD-10-CM | POA: Diagnosis not present

## 2015-08-19 DIAGNOSIS — Z716 Tobacco abuse counseling: Secondary | ICD-10-CM | POA: Diagnosis not present

## 2015-08-19 DIAGNOSIS — S93602D Unspecified sprain of left foot, subsequent encounter: Secondary | ICD-10-CM | POA: Diagnosis not present

## 2015-08-19 MED ORDER — MAGIC MOUTHWASH W/LIDOCAINE: 5.0000 mL | Freq: Four times a day (QID) | ORAL | Status: DC | PRN

## 2015-08-19 MED ORDER — VARENICLINE TARTRATE 0.5 MG X 11 & 1 MG X 42 PO MISC: ORAL | Status: DC

## 2015-08-19 NOTE — Progress Notes (Signed)
   Subjective:    Patient ID: Michelle [name], female    DOB: 1990-08-20, 25 y.o.   MRN: [id number]   CC: gum pain  HPI: 25 year old female presenting for gum pain for lower jaw  Gum pain - She has previously has had pain like this before and has been treated for gingivitis with improved oral hygiene - she awoke with pain this morning. Called her dentist however was not able to have an appointment until the 16th of this month. - She presents concerning pain control - She denies discrete pain with a single tooth. But is having pain over the front of her lower jaw at the gumline - She does not loss. She has pain with this - she had difficulty chewing this morning. Secondary to the pain - she denies fevers, chills - she smokes. And is concerned that her smoking is expecting her dental hygiene  Smoking - She has been smoking since she was 25 years old. - She is interested in stopping and is trying to transition to e-cigarette - She does not want to try the patch or the nicotine gum - she is worried that her smoking is affecting her teeth and is making them less healthy  Review of Systems ROS  Per history of present illness, otherwise she denies nausea, vomiting, diarrhea, weakness, changes in vision  Past Medical, Surgical, Social, and Family History Reviewed & Updated per EMR.   Objective:  BP 146/75 mmHg  Pulse 101  Temp(Src) 98.5 F (36.9 C) (Oral)  Wt 159 lb (72.122 kg)  LMP 08/18/2015 Vitals and nursing note reviewed  General: NAD HEENT: poor oral hygiene with with tartar noted diffusely over gum line, some tooth discoloration over gum line of teeth numbers 23 -26, no tooth abscess noted Cardiac: RRR,  Respiratory: CTAB, normal effort Skin: warm and dry, no rashes noted Neuro: alert and oriented, no focal deficits   Assessment & Plan:    Gingivitis Poor oral hygiene with diffuse gingivitis noted on exam. Pain likely secondary to this, however she will need to be  evaluated by dentist to rule out cavity or dental infection. No obvious lesions or abscesses noted on exam.  - She was strongly encouraged to keep her dentist appointment. Call him should she have any changes in her symptoms - Presenting control at this time will prescribe mouthwash with lidocaine and encourage good oral hygiene   Encounter for smoking cessation counseling Discussed smoking cessation today and patient amenable to quitting. Discussed cessation with nicotine patch, nicotine gum, versus oral medication. She would like to try Chantix to help her quit smoking. She has been counseled on how to take the medicine and reassured of our support. - Will prescribe Chantix starter pack now - Will need continuation pak in one month - She will follow closely with her PCP to monitor her progress     Tabb Croghan A. Lincoln Brigham MD, [facility] Resident PGY-2 Pager [phone]

## 2015-08-19 NOTE — Assessment & Plan Note (Signed)
Poor oral hygiene with diffuse gingivitis noted on exam. Pain likely secondary to this, however she will need to be evaluated by dentist to rule out cavity or dental infection. No obvious lesions or abscesses noted on exam.  - She was strongly encouraged to keep her dentist appointment. Call him should she have any changes in her symptoms - Presenting control at this time will prescribe mouthwash with lidocaine and encourage good oral hygiene

## 2015-08-19 NOTE — Patient Instructions (Addendum)
Follow-up with PCP in as scheduled . You were started on Chantix to help with smoking. You may smoke for the first week of therapy. Please try to stop smoking after that. Please take Chantix with food. You were also given a prescription for lidocaine mouthwash to help with gum pain Please present to your dentist's office for further workup. If you have any questions or concerns please call the office at [phone].

## 2015-08-19 NOTE — Assessment & Plan Note (Signed)
Discussed smoking cessation today and patient amenable to quitting. Discussed cessation with nicotine patch, nicotine gum, versus oral medication. She would like to try Chantix to help her quit smoking. She has been counseled on how to take the medicine and reassured of our support. - Will prescribe Chantix starter pack now - Will need continuation pak in one month - She will follow closely with her PCP to monitor her progress

## 2015-08-23 ENCOUNTER — Encounter: Payer: Self-pay | Admitting: Family Medicine

## 2015-08-23 ENCOUNTER — Ambulatory Visit (INDEPENDENT_AMBULATORY_CARE_PROVIDER_SITE_OTHER): Payer: Medicaid Other | Admitting: Family Medicine

## 2015-08-23 VITALS — BP 110/65 | HR 91 | Temp 98.3°F | Ht 62.5 in | Wt 160.4 lb

## 2015-08-23 DIAGNOSIS — D179 Benign lipomatous neoplasm, unspecified: Secondary | ICD-10-CM | POA: Insufficient documentation

## 2015-08-23 DIAGNOSIS — D171 Benign lipomatous neoplasm of skin and subcutaneous tissue of trunk: Secondary | ICD-10-CM | POA: Diagnosis not present

## 2015-08-23 DIAGNOSIS — S93602D Unspecified sprain of left foot, subsequent encounter: Secondary | ICD-10-CM | POA: Diagnosis not present

## 2015-08-23 HISTORY — DX: Benign lipomatous neoplasm, unspecified: D17.9

## 2015-08-23 NOTE — Assessment & Plan Note (Signed)
Causing back pain as a consequence of its size in the left lower back. Will refer to surgery for excision.

## 2015-08-23 NOTE — Progress Notes (Signed)
Subjective: Michelle [name] is a 25 y.o. female returning for back pain.   She reports having low back pain on the left for several years described as "achy," moderate, intermittent but daily, worsening since she first noticed it at age 24 years. It is worse when laying on her back, and develops a sharp pain when her husband rubs her lower back. She has been told she's had "fatty tumors" on her lower back since this first began and that there was no need to do anything for them. NSAIDs have not helped.   - ROS: She denies rapidly enlarging areas, eliciting pain with bending, twisting, any neck movements. No injury to her back. No bowel/bladder problems, fever, chills, unintentional weight loss, night time awakenings secondary to pain, weakness in one or both legs. - Smoker - History of c-section, adenoidectomy, lap appendectomy by Dr. Georganna Skeans in 2014.  - No FH lipomas  Objective: LMP 08/18/2015 Gen: No distress Back: soft, oval, mobile ~4cm x 3 cm subcutaneous nodule on left lower back near beltline. Spine with normal alignment and no deformity. No tenderness to vertebral process palpation. Paraspinous muscles are not tender and without spasm.   Range of motion is full at neck and lumbar sacral regions. Straight leg raise is negative Neuro:  Sensation and motor function 5/5 bilateral lower extremities. Patellar and achilles DTR's 2+.  Assessment/Plan: Michelle [name] is a 25 y.o. female here for back pain related to lipoma.   Lipoma of back Causing back pain as a consequence of its size in the left lower back. Will refer to surgery for excision.

## 2015-08-23 NOTE — Patient Instructions (Addendum)
Lipoma A lipoma is a noncancerous (benign) tumor that is made up of fat cells. This is a very common type of soft-tissue growth. Lipomas are usually found under the skin (subcutaneous). They may occur in any tissue of the body that contains fat. Common areas for lipomas to appear include the back, shoulders, buttocks, and thighs. Lipomas grow slowly, and they are usually painless. Most lipomas do not cause problems and do not require treatment. CAUSES The cause of this condition is not known. RISK FACTORS This condition is more likely to develop in:  People who are 43-60 years old.  People who have a family history of lipomas. SYMPTOMS A lipoma usually appears as a small, round bump under the skin. It may feel soft or rubbery, but the firmness can vary. Most lipomas are not painful. However, a lipoma may become painful if it is located in an area where it pushes on nerves. DIAGNOSIS A lipoma can usually be diagnosed with a physical exam. You may also have tests to confirm the diagnosis and to rule out other conditions. Tests may include:  Imaging tests, such as a CT scan or MRI.  Removal of a tissue sample to be looked at under a microscope (biopsy). TREATMENT Treatment is not needed for small lipomas that are not causing problems. If a lipoma continues to get bigger or it causes problems, removal is often the best option. Lipomas can also be removed to improve appearance. Removal of a lipoma is usually done with a surgery in which the fatty cells and the surrounding capsule are removed. Most often, a medicine that numbs the area (local anesthetic) is used for this procedure. HOME CARE INSTRUCTIONS  Keep all follow-up visits as directed by your health care provider. This is important. SEEK MEDICAL CARE IF:  Your lipoma becomes larger or hard.  Your lipoma becomes painful, red, or increasingly swollen. These could be signs of infection o  r a more serious condition.

## 2015-09-01 ENCOUNTER — Ambulatory Visit: Payer: Self-pay | Admitting: Surgery

## 2015-09-01 NOTE — H&P (Signed)
History of Present Illness Michelle [name]. Michelle Althouse MD; 09/01/2015 2:56 PM) Patient words: reck.  The patient is a 25 year old female who presents with a complaint of Mass. Referred by Dr. Vance Gather for evaluation of lumbar mass.  This is a 25 year old female in good health who presents with a 10 year history of a slowly enlarging mass in her left lumbar region. Recently this has become noticeably larger and is quite uncomfortable. It causes muscle soreness and occasional muscle spasm. She has not had any imaging of this area. She is now referred to discuss excision.   Other Problems [name], CMA; 09/01/2015 2:36 PM) Asthma Back Pain Depression  Past Surgical History [name], CMA; 09/01/2015 2:36 PM) Appendectomy Cesarean Section - 1 Oral Surgery Resection of Stomach Tonsillectomy  Diagnostic Studies History [name], CMA; 09/01/2015 2:36 PM) Colonoscopy never Mammogram never Pap Smear 1-5 years ago  Allergies [name], CMA; 09/01/2015 2:37 PM) Codeine Phosphate *ANALGESICS - OPIOID*  Medication History [name], CMA; 09/01/2015 2:37 PM) Chantix Starting Month Pak (0.5 MG X 11 &1 MG X 42 Tablet, Oral) Active. Meloxicam (15MG  Tablet, Oral) Active. ProAir HFA (108 (90 Base)MCG/ACT Aerosol Soln, Inhalation) Active. Medications Reconciled  Social History [name], CMA; 09/01/2015 2:36 PM) No alcohol use No drug use Tobacco use Current every day smoker.  Family History [name], [place]; 09/01/2015 2:36 PM) Alcohol Abuse Mother. Diabetes Mellitus Mother, Sister. Hypertension Father.  Pregnancy / Birth History [name], [place]; 09/01/2015 2:36 PM) Age at menarche 66 years. Contraceptive History Depo-provera, Intrauterine device, Oral contraceptives. Gravida 3 Maternal age 75-20 Para 3 Regular periods     Review of Systems ([name]; 09/01/2015 2:36 PM) General Not Present- Appetite Loss, Chills, Fatigue, Fever,  Night Sweats, Weight Gain and Weight Loss. Skin Not Present- Change in Wart/Mole, Dryness, Hives, Jaundice, New Lesions, Non-Healing Wounds, Rash and Ulcer. HEENT Present- Wears glasses/contact lenses. Not Present- Earache, Hearing Loss, Hoarseness, Nose Bleed, Oral Ulcers, Ringing in the Ears, Seasonal Allergies, Sinus Pain, Sore Throat, Visual Disturbances and Yellow Eyes. Respiratory Not Present- Bloody sputum, Chronic Cough, Difficulty Breathing, Snoring and Wheezing. Breast Not Present- Breast Mass, Breast Pain, Nipple Discharge and Skin Changes. Cardiovascular Not Present- Chest Pain, Difficulty Breathing Lying Down, Leg Cramps, Palpitations, Rapid Heart Rate, Shortness of Breath and Swelling of Extremities. Gastrointestinal Not Present- Abdominal Pain, Bloating, Bloody Stool, Change in Bowel Habits, Chronic diarrhea, Constipation, Difficulty Swallowing, Excessive gas, Gets full quickly at meals, Hemorrhoids, Indigestion, Nausea, Rectal Pain and Vomiting. Female Genitourinary Not Present- Frequency, Nocturia, Painful Urination, Pelvic Pain and Urgency. Musculoskeletal Present- Back Pain. Not Present- Joint Pain, Joint Stiffness, Muscle Pain, Muscle Weakness and Swelling of Extremities. Neurological Not Present- Decreased Memory, Fainting, Headaches, Numbness, Seizures, Tingling, Tremor, Trouble walking and Weakness. Psychiatric Not Present- Anxiety, Bipolar, Change in Sleep Pattern, Depression, Fearful and Frequent crying. Endocrine Not Present- Cold Intolerance, Excessive Hunger, Hair Changes, Heat Intolerance, Hot flashes and New Diabetes. Hematology Not Present- Easy Bruising, Excessive bleeding, Gland problems, HIV and Persistent Infections.  Vitals (Sonya Bynum CMA; 09/01/2015 2:36 PM) 09/01/2015 2:36 PM Weight: 161 lb Height: 62in Body Surface Area: 1.74 m Body Mass Index: 29.45 kg/m  Temp.: 69F(Temporal)  Pulse: 76 (Regular)  BP: 124/80 (Sitting, Left Arm,  Standard)      Physical Exam Rodman Key K. Cosby Proby MD; 09/01/2015 2:58 PM)  The physical exam findings are as follows: Note:WDWN in NAD Lumbar region - no gross asymmetry In the subcutaneous tissues to the left of midline, there  is a firm indistinct 3 cm area that is mildly tender. No overlying skin changes.    Assessment & Plan Michelle [name]. Cristopher Ciccarelli MD; 09/01/2015 2:49 PM)  LIPOMA OF BACK (D17.1)  Current Plans Schedule for Surgery - Excision of subcutaneous lipoma of the back. The surgical procedure has been discussed with the patient. Potential risks, benefits, alternative treatments, and expected outcomes have been explained. All of the patient's questions at this time have been answered. The likelihood of reaching the patient's treatment goal is good. The patient understand the proposed surgical procedure and wishes to proceed.  Michelle [name]. Georgette Dover, MD, [facility] Surgery  General/ Trauma Surgery  09/01/2015 2:59 PM

## 2015-09-10 ENCOUNTER — Encounter (HOSPITAL_BASED_OUTPATIENT_CLINIC_OR_DEPARTMENT_OTHER): Payer: Self-pay | Admitting: *Deleted

## 2015-09-16 ENCOUNTER — Encounter (HOSPITAL_BASED_OUTPATIENT_CLINIC_OR_DEPARTMENT_OTHER): Payer: Self-pay | Admitting: Anesthesiology

## 2015-09-16 ENCOUNTER — Ambulatory Visit (HOSPITAL_BASED_OUTPATIENT_CLINIC_OR_DEPARTMENT_OTHER): Payer: Medicaid Other | Admitting: Anesthesiology

## 2015-09-16 ENCOUNTER — Ambulatory Visit (HOSPITAL_BASED_OUTPATIENT_CLINIC_OR_DEPARTMENT_OTHER)
Admission: RE | Admit: 2015-09-16 | Discharge: 2015-09-16 | Disposition: A | Discharge status: Home / Self Care | Payer: Medicaid Other | Source: Ambulatory Visit | Attending: Surgery | Admitting: Surgery

## 2015-09-16 ENCOUNTER — Encounter (HOSPITAL_BASED_OUTPATIENT_CLINIC_OR_DEPARTMENT_OTHER)
Admission: RE | Disposition: A | Discharge status: Home / Self Care | Payer: Self-pay | Source: Ambulatory Visit | Attending: Surgery

## 2015-09-16 DIAGNOSIS — J45909 Unspecified asthma, uncomplicated: Secondary | ICD-10-CM | POA: Insufficient documentation

## 2015-09-16 DIAGNOSIS — Z79899 Other long term (current) drug therapy: Secondary | ICD-10-CM | POA: Insufficient documentation

## 2015-09-16 DIAGNOSIS — D171 Benign lipomatous neoplasm of skin and subcutaneous tissue of trunk: Secondary | ICD-10-CM | POA: Diagnosis not present

## 2015-09-16 DIAGNOSIS — F17200 Nicotine dependence, unspecified, uncomplicated: Secondary | ICD-10-CM | POA: Diagnosis not present

## 2015-09-16 HISTORY — DX: Family history of other specified conditions: Z84.89

## 2015-09-16 HISTORY — PX: LIPOMA EXCISION: SHX5283

## 2015-09-16 HISTORY — DX: Personal history of other specified conditions: Z87.898

## 2015-09-16 HISTORY — DX: Benign lipomatous neoplasm of skin and subcutaneous tissue of trunk: D17.1

## 2015-09-16 SURGERY — EXCISION LIPOMA
Anesthesia: General | Site: Back

## 2015-09-16 MED ORDER — MIDAZOLAM HCL 5 MG/5ML IJ SOLN
INTRAMUSCULAR | Status: DC | PRN
  Administered 2015-09-16: 2 mg via INTRAVENOUS

## 2015-09-16 MED ORDER — FENTANYL CITRATE (PF) 100 MCG/2ML IJ SOLN: 50.0000 ug | INTRAMUSCULAR | Status: DC | PRN

## 2015-09-16 MED ORDER — SUCCINYLCHOLINE CHLORIDE 20 MG/ML IJ SOLN
INTRAMUSCULAR | Status: DC | PRN
  Administered 2015-09-16: 50 mg via INTRAVENOUS

## 2015-09-16 MED ORDER — BUPIVACAINE-EPINEPHRINE (PF) 0.25% -1:200000 IJ SOLN
INTRAMUSCULAR | Status: AC
  Filled 2015-09-16: qty 30

## 2015-09-16 MED ORDER — LIDOCAINE HCL (CARDIAC) 20 MG/ML IV SOLN
INTRAVENOUS | Status: AC
  Filled 2015-09-16: qty 5

## 2015-09-16 MED ORDER — SCOPOLAMINE 1 MG/3DAYS TD PT72: 1.0000 | MEDICATED_PATCH | Freq: Once | TRANSDERMAL | Status: DC | PRN

## 2015-09-16 MED ORDER — ONDANSETRON HCL 4 MG/2ML IJ SOLN
INTRAMUSCULAR | Status: AC
  Filled 2015-09-16: qty 2

## 2015-09-16 MED ORDER — HYDROMORPHONE HCL 1 MG/ML IJ SOLN
0.2500 mg | INTRAMUSCULAR | Status: DC | PRN
  Administered 2015-09-16: 0.5 mg via INTRAVENOUS

## 2015-09-16 MED ORDER — OXYCODONE-ACETAMINOPHEN 5-325 MG PO TABS: 1.0000 | ORAL_TABLET | ORAL | Status: DC | PRN

## 2015-09-16 MED ORDER — FENTANYL CITRATE (PF) 100 MCG/2ML IJ SOLN
INTRAMUSCULAR | Status: AC
  Filled 2015-09-16: qty 2

## 2015-09-16 MED ORDER — LACTATED RINGERS IV SOLN
INTRAVENOUS | Status: DC
  Administered 2015-09-16 (×2): via INTRAVENOUS

## 2015-09-16 MED ORDER — CEFAZOLIN SODIUM-DEXTROSE 2-3 GM-% IV SOLR
2.0000 g | INTRAVENOUS | Status: AC
  Administered 2015-09-16: 2 g via INTRAVENOUS

## 2015-09-16 MED ORDER — ONDANSETRON HCL 4 MG/2ML IJ SOLN
INTRAMUSCULAR | Status: DC | PRN
  Administered 2015-09-16: 4 mg via INTRAVENOUS

## 2015-09-16 MED ORDER — FENTANYL CITRATE (PF) 100 MCG/2ML IJ SOLN
INTRAMUSCULAR | Status: DC | PRN
  Administered 2015-09-16: 50 ug via INTRAVENOUS
  Administered 2015-09-16: 100 ug via INTRAVENOUS

## 2015-09-16 MED ORDER — DEXAMETHASONE SODIUM PHOSPHATE 4 MG/ML IJ SOLN
INTRAMUSCULAR | Status: DC | PRN
  Administered 2015-09-16: 10 mg via INTRAVENOUS

## 2015-09-16 MED ORDER — PROPOFOL 10 MG/ML IV BOLUS
INTRAVENOUS | Status: DC | PRN
  Administered 2015-09-16: 200 mg via INTRAVENOUS

## 2015-09-16 MED ORDER — MIDAZOLAM HCL 2 MG/2ML IJ SOLN: 1.0000 mg | INTRAMUSCULAR | Status: DC | PRN

## 2015-09-16 MED ORDER — DEXAMETHASONE SODIUM PHOSPHATE 10 MG/ML IJ SOLN
INTRAMUSCULAR | Status: AC
  Filled 2015-09-16: qty 1

## 2015-09-16 MED ORDER — CEFAZOLIN SODIUM-DEXTROSE 2-3 GM-% IV SOLR
INTRAVENOUS | Status: AC
  Filled 2015-09-16: qty 50

## 2015-09-16 MED ORDER — CHLORHEXIDINE GLUCONATE 4 % EX LIQD: 1.0000 "application " | Freq: Once | CUTANEOUS | Status: DC

## 2015-09-16 MED ORDER — BUPIVACAINE-EPINEPHRINE 0.25% -1:200000 IJ SOLN
INTRAMUSCULAR | Status: DC | PRN
  Administered 2015-09-16: 10 mL

## 2015-09-16 MED ORDER — HYDROMORPHONE HCL 1 MG/ML IJ SOLN
INTRAMUSCULAR | Status: AC
  Filled 2015-09-16: qty 1

## 2015-09-16 MED ORDER — PROMETHAZINE HCL 25 MG/ML IJ SOLN: 6.2500 mg | INTRAMUSCULAR | Status: DC | PRN

## 2015-09-16 MED ORDER — GLYCOPYRROLATE 0.2 MG/ML IJ SOLN: 0.2000 mg | Freq: Once | INTRAMUSCULAR | Status: DC | PRN

## 2015-09-16 MED ORDER — PROPOFOL 10 MG/ML IV BOLUS
INTRAVENOUS | Status: AC
  Filled 2015-09-16: qty 40

## 2015-09-16 MED ORDER — MIDAZOLAM HCL 2 MG/2ML IJ SOLN
INTRAMUSCULAR | Status: AC
Start: 2015-09-16 — End: 2015-09-16
  Filled 2015-09-16: qty 2

## 2015-09-16 MED ORDER — LIDOCAINE HCL (CARDIAC) 20 MG/ML IV SOLN
INTRAVENOUS | Status: DC | PRN
  Administered 2015-09-16 (×2): 50 mg via INTRAVENOUS

## 2015-09-16 MED ORDER — KETOROLAC TROMETHAMINE 30 MG/ML IJ SOLN
INTRAMUSCULAR | Status: AC
  Filled 2015-09-16: qty 1

## 2015-09-16 MED ORDER — KETOROLAC TROMETHAMINE 30 MG/ML IJ SOLN
INTRAMUSCULAR | Status: DC | PRN
  Administered 2015-09-16: 30 mg via INTRAVENOUS

## 2015-09-16 MED ORDER — MORPHINE SULFATE (PF) 2 MG/ML IV SOLN: 2.0000 mg | INTRAVENOUS | Status: DC | PRN

## 2015-09-16 MED ORDER — ONDANSETRON HCL 4 MG/2ML IJ SOLN: 4.0000 mg | INTRAMUSCULAR | Status: DC | PRN

## 2015-09-16 SURGICAL SUPPLY — 45 items
BENZOIN TINCTURE PRP APPL 2/3 (GAUZE/BANDAGES/DRESSINGS) ×3 IMPLANT
BLADE CLIPPER SURG (BLADE) IMPLANT
BLADE SURG 15 STRL LF DISP TIS (BLADE) ×1 IMPLANT
BLADE SURG 15 STRL SS (BLADE) ×2
CANISTER SUCT 1200ML W/VALVE (MISCELLANEOUS) IMPLANT
CHLORAPREP W/TINT 26ML (MISCELLANEOUS) ×3 IMPLANT
CLOSURE WOUND 1/2 X4 (GAUZE/BANDAGES/DRESSINGS) ×1
COVER BACK TABLE 60X90IN (DRAPES) ×3 IMPLANT
COVER MAYO STAND STRL (DRAPES) ×3 IMPLANT
DECANTER SPIKE VIAL GLASS SM (MISCELLANEOUS) IMPLANT
DRAPE LAPAROTOMY 100X72 PEDS (DRAPES) ×3 IMPLANT
DRAPE UTILITY XL STRL (DRAPES) ×3 IMPLANT
DRSG TEGADERM 4X4.75 (GAUZE/BANDAGES/DRESSINGS) ×3 IMPLANT
ELECT COATED BLADE 2.86 ST (ELECTRODE) ×3 IMPLANT
ELECT REM PT RETURN 9FT ADLT (ELECTROSURGICAL) ×3
ELECTRODE REM PT RTRN 9FT ADLT (ELECTROSURGICAL) ×1 IMPLANT
GLOVE BIO SURGEON STRL SZ 6.5 (GLOVE) ×2 IMPLANT
GLOVE BIO SURGEON STRL SZ7 (GLOVE) ×3 IMPLANT
GLOVE BIO SURGEONS STRL SZ 6.5 (GLOVE) ×1
GLOVE BIOGEL PI IND STRL 7.0 (GLOVE) ×2 IMPLANT
GLOVE BIOGEL PI IND STRL 7.5 (GLOVE) ×1 IMPLANT
GLOVE BIOGEL PI INDICATOR 7.0 (GLOVE) ×4
GLOVE BIOGEL PI INDICATOR 7.5 (GLOVE) ×2
GOWN STRL REUS W/ TWL LRG LVL3 (GOWN DISPOSABLE) ×2 IMPLANT
GOWN STRL REUS W/TWL LRG LVL3 (GOWN DISPOSABLE) ×4
NEEDLE HYPO 25X1 1.5 SAFETY (NEEDLE) ×3 IMPLANT
NS IRRIG 1000ML POUR BTL (IV SOLUTION) IMPLANT
PACK BASIN DAY SURGERY FS (CUSTOM PROCEDURE TRAY) ×3 IMPLANT
PENCIL BUTTON HOLSTER BLD 10FT (ELECTRODE) ×3 IMPLANT
SPONGE GAUZE 2X2 8PLY STER LF (GAUZE/BANDAGES/DRESSINGS)
SPONGE GAUZE 2X2 8PLY STRL LF (GAUZE/BANDAGES/DRESSINGS) IMPLANT
SPONGE GAUZE 4X4 12PLY STER LF (GAUZE/BANDAGES/DRESSINGS) IMPLANT
STRIP CLOSURE SKIN 1/2X4 (GAUZE/BANDAGES/DRESSINGS) ×2 IMPLANT
SUT MON AB 4-0 PC3 18 (SUTURE) IMPLANT
SUT PROLENE 6 0 P 1 18 (SUTURE) IMPLANT
SUT SILK 2 0 FS (SUTURE) IMPLANT
SUT VIC AB 3-0 SH 27 (SUTURE)
SUT VIC AB 3-0 SH 27X BRD (SUTURE) IMPLANT
SUT VICRYL 3-0 CR8 SH (SUTURE) IMPLANT
SYR CONTROL 10ML LL (SYRINGE) ×3 IMPLANT
TOWEL OR 17X24 6PK STRL BLUE (TOWEL DISPOSABLE) ×3 IMPLANT
TOWEL OR NON WOVEN STRL DISP B (DISPOSABLE) ×3 IMPLANT
TUBE CONNECTING 20'X1/4 (TUBING)
TUBE CONNECTING 20X1/4 (TUBING) IMPLANT
YANKAUER SUCT BULB TIP NO VENT (SUCTIONS) IMPLANT

## 2015-09-16 NOTE — Transfer of Care (Signed)
Immediate Anesthesia Transfer of Care Note  Patient: Michelle [name]  Procedure(s) Performed: Procedure(s): EXCISION LIPOMA LUMBAR REGION (N/A)  Patient Location: PACU  Anesthesia Type:General  Level of Consciousness: awake and patient cooperative  Airway & Oxygen Therapy: Patient Spontanous Breathing and Patient connected to face mask oxygen  Post-op Assessment: Report given to RN and Post -op Vital signs reviewed and stable  Post vital signs: Reviewed and stable  Last Vitals:  Filed Vitals:   09/16/15 0823  BP: 109/65  Pulse: 89  Temp: 36.7 C  Resp: 20    Complications: No apparent anesthesia complications

## 2015-09-16 NOTE — Anesthesia Procedure Notes (Signed)
Procedure Name: Intubation Date/Time: 09/16/2015 9:32 AM Performed by: Marrianne Mood Pre-anesthesia Checklist: Patient identified, Emergency Drugs available, Suction available, Patient being monitored and Timeout performed Patient Re-evaluated:Patient Re-evaluated prior to inductionOxygen Delivery Method: Circle System Utilized Preoxygenation: Pre-oxygenation with 100% oxygen Intubation Type: IV induction Ventilation: Mask ventilation without difficulty Laryngoscope Size: Miller and 3 Grade View: Grade II Tube type: Oral Tube size: 7.0 mm Number of attempts: 1 Airway Equipment and Method: Stylet and Oral airway Placement Confirmation: ETT inserted through vocal cords under direct vision,  positive ETCO2 and breath sounds checked- equal and bilateral Secured at: 20 cm Tube secured with: Tape Dental Injury: Teeth and Oropharynx as per pre-operative assessment

## 2015-09-16 NOTE — Anesthesia Postprocedure Evaluation (Signed)
Anesthesia Post Note  Patient: Michelle [name]  Procedure(s) Performed: Procedure(s) (LRB): EXCISION LIPOMA LUMBAR REGION (N/A)  Patient location during evaluation: PACU Anesthesia Type: General Level of consciousness: sedated Pain management: pain level controlled Vital Signs Assessment: post-procedure vital signs reviewed and stable Respiratory status: spontaneous breathing and respiratory function stable Cardiovascular status: stable Anesthetic complications: no    Last Vitals:  Filed Vitals:   09/16/15 1015 09/16/15 1030  BP:  102/69  Pulse: 103 88  Temp: 36.4 C   Resp: 18 15    Last Pain:  Filed Vitals:   09/16/15 1040  PainSc: 0-No pain                 Jemmie Rhinehart DANIEL

## 2015-09-16 NOTE — Op Note (Signed)
Preop diagnosis: Subcutaneous lipoma left lumbar region Postop diagnosis: Same Procedure performed: Excision of subcutaneous lipoma left lumbar region (5 cm) Surgeon:Ridhi Hoffert K. Anesthesia Gen. Indications: This is a 25 year old female who presents with a 10 year history of a slowly enlarging mass at her left lumbar region. This has become larger and more uncomfortable. It causes some surrounding muscle spasm and soreness. She presents now to discuss excision. A firm mass is palpated deep within the subcutaneous tissue in this area.  Description of procedure: The patient is brought to the operating room and placed in a supine position on the operating room table. After an adequate level general anesthesia was obtained the patient was moved to the prone position with appropriate padding. Her lumbar region was prepped with ChloraPrep and draped sterile fashion. A timeout was taken to ensure the proper patient and proper procedure. The area over the palpable mass was infiltrated with 0.25% Marcaine with epinephrine. A transverse incision was made. Dissection was carried down the subcutaneous tissue with cautery. We encountered a fairly large firm lipoma measuring about 5 cm. This extended all the way down to the musculature. We excised this entirely with cautery. We took some of the surrounding adipose tissue as well. There is a smaller immediately adjacent lipoma that was also removed. All this tissue was sent for pathologic examination.  We irrigated the wound thoroughly and inspected for hemostasis. The wound was closed with 3-0 Vicryl and 4 Monocryl. Steri-Strips and clean dressings were applied. The patient was then extubated and brought to recovery room in stable condition. All sponge, instrument, and needle counts are correct.  Imogene Burn. Georgette Dover, MD, [facility] Surgery  General/ Trauma Surgery  09/16/2015 10:13 AM

## 2015-09-16 NOTE — Interval H&P Note (Signed)
History and Physical Interval Note:  09/16/2015 9:03 AM  Michelle [name]  has presented today for surgery, with the diagnosis of Subcutaneous lipoma lumbar region  The various methods of treatment have been discussed with the patient and family. After consideration of risks, benefits and other options for treatment, the patient has consented to  Procedure(s): [place] (N/A) as a surgical intervention .  The patient's history has been reviewed, patient examined, no change in status, stable for surgery.  I have reviewed the patient's chart and labs.  Questions were answered to the patient's satisfaction.     [name] K.

## 2015-09-16 NOTE — H&P (View-Only) (Signed)
History of Present Illness Michelle [name]. Michelle Cianci MD; 09/01/2015 2:56 PM) Patient words: reck.  The patient is a 25 year old female who presents with a complaint of Mass. Referred by Dr. Vance Gather for evaluation of lumbar mass.  This is a 25 year old female in good health who presents with a 10 year history of a slowly enlarging mass in her left lumbar region. Recently this has become noticeably larger and is quite uncomfortable. It causes muscle soreness and occasional muscle spasm. She has not had any imaging of this area. She is now referred to discuss excision.   Other Problems [name], CMA; 09/01/2015 2:36 PM) Asthma Back Pain Depression  Past Surgical History [name], CMA; 09/01/2015 2:36 PM) Appendectomy Cesarean Section - 1 Oral Surgery Resection of Stomach Tonsillectomy  Diagnostic Studies History [name], CMA; 09/01/2015 2:36 PM) Colonoscopy never Mammogram never Pap Smear 1-5 years ago  Allergies [name], CMA; 09/01/2015 2:37 PM) Codeine Phosphate *ANALGESICS - OPIOID*  Medication History [name], CMA; 09/01/2015 2:37 PM) Chantix Starting Month Pak (0.5 MG X 11 &1 MG X 42 Tablet, Oral) Active. Meloxicam (15MG  Tablet, Oral) Active. ProAir HFA (108 (90 Base)MCG/ACT Aerosol Soln, Inhalation) Active. Medications Reconciled  Social History [name], CMA; 09/01/2015 2:36 PM) No alcohol use No drug use Tobacco use Current every day smoker.  Family History [name], [place]; 09/01/2015 2:36 PM) Alcohol Abuse Mother. Diabetes Mellitus Mother, Sister. Hypertension Father.  Pregnancy / Birth History [name], [place]; 09/01/2015 2:36 PM) Age at menarche 27 years. Contraceptive History Depo-provera, Intrauterine device, Oral contraceptives. Gravida 3 Maternal age 32-20 Para 3 Regular periods     Review of Systems ([place]; 09/01/2015 2:36 PM) General Not Present- Appetite Loss, Chills, Fatigue, Fever,  Night Sweats, Weight Gain and Weight Loss. Skin Not Present- Change in Wart/Mole, Dryness, Hives, Jaundice, New Lesions, Non-Healing Wounds, Rash and Ulcer. HEENT Present- Wears glasses/contact lenses. Not Present- Earache, Hearing Loss, Hoarseness, Nose Bleed, Oral Ulcers, Ringing in the Ears, Seasonal Allergies, Sinus Pain, Sore Throat, Visual Disturbances and Yellow Eyes. Respiratory Not Present- Bloody sputum, Chronic Cough, Difficulty Breathing, Snoring and Wheezing. Breast Not Present- Breast Mass, Breast Pain, Nipple Discharge and Skin Changes. Cardiovascular Not Present- Chest Pain, Difficulty Breathing Lying Down, Leg Cramps, Palpitations, Rapid Heart Rate, Shortness of Breath and Swelling of Extremities. Gastrointestinal Not Present- Abdominal Pain, Bloating, Bloody Stool, Change in Bowel Habits, Chronic diarrhea, Constipation, Difficulty Swallowing, Excessive gas, Gets full quickly at meals, Hemorrhoids, Indigestion, Nausea, Rectal Pain and Vomiting. Female Genitourinary Not Present- Frequency, Nocturia, Painful Urination, Pelvic Pain and Urgency. Musculoskeletal Present- Back Pain. Not Present- Joint Pain, Joint Stiffness, Muscle Pain, Muscle Weakness and Swelling of Extremities. Neurological Not Present- Decreased Memory, Fainting, Headaches, Numbness, Seizures, Tingling, Tremor, Trouble walking and Weakness. Psychiatric Not Present- Anxiety, Bipolar, Change in Sleep Pattern, Depression, Fearful and Frequent crying. Endocrine Not Present- Cold Intolerance, Excessive Hunger, Hair Changes, Heat Intolerance, Hot flashes and New Diabetes. Hematology Not Present- Easy Bruising, Excessive bleeding, Gland problems, HIV and Persistent Infections.  Vitals (Sonya Bynum CMA; 09/01/2015 2:36 PM) 09/01/2015 2:36 PM Weight: 161 lb Height: 62in Body Surface Area: 1.74 m Body Mass Index: 29.45 kg/m  Temp.: 60F(Temporal)  Pulse: 76 (Regular)  BP: 124/80 (Sitting, Left Arm,  Standard)      Physical Exam Rodman Key K. Saima Monterroso MD; 09/01/2015 2:58 PM)  The physical exam findings are as follows: Note:WDWN in NAD Lumbar region - no gross asymmetry In the subcutaneous tissues to the left of midline, there  is a firm indistinct 3 cm area that is mildly tender. No overlying skin changes.    Assessment & Plan Michelle [name]. Kilani Joffe MD; 09/01/2015 2:49 PM)  LIPOMA OF BACK (D17.1)  Current Plans Schedule for Surgery - Excision of subcutaneous lipoma of the back. The surgical procedure has been discussed with the patient. Potential risks, benefits, alternative treatments, and expected outcomes have been explained. All of the patient's questions at this time have been answered. The likelihood of reaching the patient's treatment goal is good. The patient understand the proposed surgical procedure and wishes to proceed.  Michelle [name]. Georgette Dover, MD, [facility] Surgery  General/ Trauma Surgery  09/01/2015 2:59 PM

## 2015-09-16 NOTE — Anesthesia Preprocedure Evaluation (Signed)
Anesthesia Evaluation  Patient identified by MRN, date of birth, ID band Patient awake    Reviewed: Allergy & Precautions, NPO status , Patient's Chart, lab work & pertinent test results  History of Anesthesia Complications Negative for: history of anesthetic complications  Airway Mallampati: II  TM Distance: >3 FB Neck ROM: Full    Dental no notable dental hx. (+) Dental Advisory Given   Pulmonary neg pulmonary ROS, asthma , Current Smoker,    Pulmonary exam normal breath sounds clear to auscultation       Cardiovascular negative cardio ROS Normal cardiovascular exam Rhythm:Regular Rate:Normal     Neuro/Psych  Headaches, Seizures - (during last C/S, thought to be eclampsia),  PSYCHIATRIC DISORDERS Anxiety Depression Bipolar Disorder    GI/Hepatic negative GI ROS, Neg liver ROS,   Endo/Other  negative endocrine ROS  Renal/GU negative Renal ROS  negative genitourinary   Musculoskeletal negative musculoskeletal ROS (+)   Abdominal   Peds negative pediatric ROS (+)  Hematology negative hematology ROS (+)   Anesthesia Other Findings   Reproductive/Obstetrics negative OB ROS                             Anesthesia Physical  Anesthesia Plan  ASA: II  Anesthesia Plan: General   Post-op Pain Management:    Induction: Intravenous  Airway Management Planned: Oral ETT and LMA  Additional Equipment:   Intra-op Plan:   Post-operative Plan: Extubation in OR  Informed Consent: I have reviewed the patients History and Physical, chart, labs and discussed the procedure including the risks, benefits and alternatives for the proposed anesthesia with the patient or authorized representative who has indicated his/her understanding and acceptance.   Dental advisory given  Plan Discussed with: CRNA  Anesthesia Plan Comments:         Anesthesia Quick Evaluation

## 2015-09-16 NOTE — Discharge Instructions (Signed)
You may apply ice to the area for the first couple of days Remove the outer dressing on Saturday, then you may shower over the steri-strips. The steri-strips will come off on their own over the next couple of weeks. Expect the are a under the incision to get firm for a few months.   Post Anesthesia Home Care Instructions  Activity: Get plenty of rest for the remainder of the day. A responsible adult should stay with you for 24 hours following the procedure.  For the next 24 hours, DO NOT: -Drive a car -Paediatric nurse -Drink alcoholic beverages -Take any medication unless instructed by your physician -Make any legal decisions or sign important papers.  Meals: Start with liquid foods such as gelatin or soup. Progress to regular foods as tolerated. Avoid greasy, spicy, heavy foods. If nausea and/or vomiting occur, drink only clear liquids until the nausea and/or vomiting subsides. Call your physician if vomiting continues.  Special Instructions/Symptoms: Your throat may feel dry or sore from the anesthesia or the breathing tube placed in your throat during surgery. If this causes discomfort, gargle with warm salt water. The discomfort should disappear within 24 hours.  If you had a scopolamine patch placed behind your ear for the management of post- operative nausea and/or vomiting:  1. The medication in the patch is effective for 72 hours, after which it should be removed.  Wrap patch in a tissue and discard in the trash. Wash hands thoroughly with soap and water. 2. You may remove the patch earlier than 72 hours if you experience unpleasant side effects which may include dry mouth, dizziness or visual disturbances. 3. Avoid touching the patch. Wash your hands with soap and water after contact with the patch.

## 2015-09-17 ENCOUNTER — Encounter (HOSPITAL_BASED_OUTPATIENT_CLINIC_OR_DEPARTMENT_OTHER): Payer: Self-pay | Admitting: Surgery

## 2015-10-11 ENCOUNTER — Other Ambulatory Visit: Payer: Self-pay | Admitting: Family Medicine

## 2015-10-19 ENCOUNTER — Ambulatory Visit (INDEPENDENT_AMBULATORY_CARE_PROVIDER_SITE_OTHER): Payer: Medicaid Other | Admitting: Family Medicine

## 2015-10-19 ENCOUNTER — Encounter: Payer: Self-pay | Admitting: Family Medicine

## 2015-10-19 VITALS — BP 108/74 | HR 103 | Temp 97.9°F | Ht 62.5 in | Wt 161.3 lb

## 2015-10-19 DIAGNOSIS — R5381 Other malaise: Secondary | ICD-10-CM | POA: Diagnosis not present

## 2015-10-19 DIAGNOSIS — S93602D Unspecified sprain of left foot, subsequent encounter: Secondary | ICD-10-CM | POA: Diagnosis not present

## 2015-10-19 MED ORDER — OSELTAMIVIR PHOSPHATE 75 MG PO CAPS: 75.0000 mg | ORAL_CAPSULE | Freq: Every day | ORAL | Status: DC

## 2015-10-19 NOTE — Assessment & Plan Note (Signed)
One of her children has been diagnosed with the flu. Her onset of symptoms started within 48 hours. She's possible that she has a viral gastroenteritis and not influenza as she is having more diarrhea and no fevers. - She requested to go ahead and Tamiflu so was sent in. - Advised liquids and supportive care - Given indications for return

## 2015-10-19 NOTE — Patient Instructions (Signed)
Thank you for coming in,   I have sent in tamiflu.   Please keep yourself hydrated and eat a bland diet.   This may be a viral gastroenteritis and may have some vomiting associated with it.   Please bring all of your medications with you to each visit.   Sign up for My Chart to have easy access to your labs results, and communication with your Primary care physician   Please feel free to call with any questions or concerns at any time, at [phone]. --Dr. Raeford Razor

## 2015-10-19 NOTE — Progress Notes (Signed)
   Subjective:    Patient ID: Michelle [name], female    DOB: [birth year], 25 y.o.   MRN: [id number]  Seen for Same day visit for   CC: Flulike symptoms  Woke up this mornign with nausea, headache and dizzniess.  Has had sick contacts in the house.  No fevers.  Received the flu vaccine this year.  Having some diarrhea for the past two days.  No abdmoinal pain.  Has history of BTL and she is having a cycle.  Denies any coughing.   SH: active tobacco  PMH: PTSD, BPPV,   Review of Systems   See HPI for ROS. Objective:  BP 108/74 mmHg  Pulse 103  Temp(Src) 97.9 F (36.6 C) (Oral)  Ht 5' 2.5" (1.588 m)  Wt 161 lb 4.8 oz (73.165 kg)  BMI 29.01 kg/m2  LMP 10/15/2015  General: NAD HEENT: Clear conjunctiva, tympanic membranes clear and intact bilaterally, no cervical lymphadenopathy, moist mucous membranes, oropharynx clear, no tonsillar exudates, Cardiac: Regular rate, tachycardia, normal heart sounds, no murmurs.  Respiratory: CTAB, normal effort Extremities:  WWP. Skin: warm and dry, no rashes noted Neuro: alert and oriented, no focal deficits     Assessment & Plan:   Malaise One of her children has been diagnosed with the flu. Her onset of symptoms started within 48 hours. She's possible that she has a viral gastroenteritis and not influenza as she is having more diarrhea and no fevers. - She requested to go ahead and Tamiflu so was sent in. - Advised liquids and supportive care - Given indications for return

## 2015-11-02 ENCOUNTER — Encounter (HOSPITAL_COMMUNITY): Payer: Self-pay | Admitting: Emergency Medicine

## 2015-11-02 ENCOUNTER — Ambulatory Visit (HOSPITAL_COMMUNITY)
Admission: EM | Admit: 2015-11-02 | Discharge: 2015-11-02 | Disposition: A | Discharge status: Home / Self Care | Payer: Medicaid Other | Attending: Family Medicine | Admitting: Family Medicine

## 2015-11-02 DIAGNOSIS — L729 Follicular cyst of the skin and subcutaneous tissue, unspecified: Secondary | ICD-10-CM | POA: Diagnosis not present

## 2015-11-02 DIAGNOSIS — L089 Local infection of the skin and subcutaneous tissue, unspecified: Secondary | ICD-10-CM

## 2015-11-02 NOTE — ED Provider Notes (Signed)
CSN: [id number]     Arrival date & time 11/02/15  1520 History   First MD Initiated Contact with Patient 11/02/15 1634     Chief Complaint  Patient presents with  . 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 They tend to burst or open up on her own and and heal without intervention. Sometimes she will use up and pop. She states several days ago she developed another "painful bump" to the back of her right ear. She is requesting to have it drained.   Past Medical History  Diagnosis Date  . PTSD (post-traumatic stress disorder)   . Asthma   . Anxiety   . History of seizure 06/20/2012    x 1 - during delivery of child(eclampsia)  . Bipolar disorder ([place])     no current med.  . Depression     no current med.  . Lipoma of lower back 08/2015  . Family history of adverse reaction to anesthesia     states mother and sister are hard to wake up post-op   Past Surgical History  Procedure Laterality Date  . Cesarean section  06/20/2012    Procedure: CESAREAN SECTION;  Surgeon: Emily Filbert, MD;  Location: [facility];  Service: Obstetrics;  Laterality: N/A;  . Adenoidectomy, tonsillectomy and myringotomy with tube placement  1990's  . Laparoscopic appendectomy  07/25/2012    Procedure: APPENDECTOMY LAPAROSCOPIC;  Surgeon: Zenovia Jarred, MD;  Location: [facility];  Service: General;  Laterality: N/A;  . Wisdom tooth extraction    . Laparoscopic tubal ligation Bilateral 08/04/2014    Procedure: BILATERAL LAPAROSCOPIC TUBAL LIGATION;  Surgeon: Woodroe Mode, MD;  Location: [facility];  Service: Gynecology;  Laterality: Bilateral;  . Lipoma excision N/A 09/16/2015    Procedure: EXCISION LIPOMA LUMBAR REGION;  Surgeon: Donnie Mesa, MD;  Location: [facility];  Service: General;  Laterality: N/A;   Family History  Problem Relation Age of Onset  . Breast cancer Paternal Grandmother   . Colon cancer Paternal Grandfather   . Colon cancer Maternal Grandfather   . Colon polyps Maternal Aunt   . Diabetes Mother   . Anesthesia problems Mother     hard to wake up post-op  . Diabetes Sister   . Anesthesia problems Sister     hard to wake up post-op  . Diabetes Father   . Heart disease Father    Social History  Substance Use Topics  . Smoking status: Current Every Day Smoker -- 0.00 packs/day for 14 years    Types: Cigarettes  . Smokeless tobacco: Never Used     Comment: 1 cig./day  . Alcohol Use: No   OB History    Gravida Para Term Preterm AB TAB SAB Ectopic Multiple Living   4 3 2 1      3       Obstetric Comments   C-Section Indication: Non-reassuring fetal tracing, growth delay & marked proteinuria & Pre-Eclampsia Eclamptic Seizure during C-section delivery     Review of Systems  Constitutional: Negative.   HENT: Positive for ear pain.   Respiratory: Negative.   Neurological: Negative.     Allergies  Codeine  Home Medications   Prior to Admission medications   Medication Sig Start Date End Date Taking? Authorizing Provider  acetaminophen (TYLENOL)  160 MG/5ML elixir Take 15 mg/kg by mouth every 4 (four) hours as needed for fever.   Yes Historical Provider, MD  ibuprofen (ADVIL,MOTRIN) 200 MG tablet Take 200 mg by mouth every 6 (six) hours as needed.   Yes Historical Provider, MD  oseltamivir (TAMIFLU) 75 MG capsule Take 1 capsule (75 mg total) by mouth daily. For a total of 7 days. 10/19/15   Rosemarie Ax, MD  oxyCODONE-acetaminophen (PERCOCET/ROXICET) 5-325 MG tablet Take 1 tablet by mouth every 4 (four) hours as needed for severe pain. 09/16/15   Donnie Mesa, MD  varenicline (CHANTIX) 1 MG tablet Take 1 mg by mouth 2 (two) times daily.    Historical Provider, MD  VENTOLIN HFA 108 (90 Base) MCG/ACT inhaler  INHALE 2 PUFFS BY MOUTH EVERY 4 HOURS AS NEEDED FOR WHEEZING OR SHORTNESS OF BREATH 10/11/15   Patrecia Pour, MD   Meds Ordered and Administered this Visit  Medications - No data to display  BP 117/80 mmHg  Pulse 97  Temp(Src) 98.6 F (37 C) (Oral)  Resp 16  SpO2 100%  LMP 10/15/2015 No data found.   Physical Exam  Constitutional: She is oriented to person, place, and time. She appears well-developed and well-nourished. No distress.  HENT:  There is a cysticsemi-spherical lesion protruding from the underside of the right earlobe. It is tender and fluctuant.No current draining. No erythema. No lymphangitis. The infection appears to be limited to the lesion itself. No erythema or apparent involvement to the remainder of the outer ear.  Eyes: EOM are normal.  Neck: Normal range of motion. Neck supple.  Cardiovascular: Normal rate.   Pulmonary/Chest: Effort normal. No respiratory distress.  Musculoskeletal: She exhibits no edema.  Neurological: She is alert and oriented to person, place, and time. She exhibits normal muscle tone.  Skin: Skin is warm and dry.  Psychiatric: She has a normal mood and affect.  Nursing note and vitals reviewed.   ED Course  .Marland KitchenIncision and Drainage Date/Time: 11/02/2015 5:18 PM Performed by: Marcha Dutton, Cyrilla Durkin Authorized by: Ihor Gully D Consent: Verbal consent obtained. Risks and benefits: risks, benefits and alternatives were discussed Consent given by: patient Patient understanding: patient states understanding of the procedure being performed Patient identity confirmed: verbally with patient Type: cyst Body area: head Location details: right external ear Patient sedated: no Scalpel size: 11 Incision type: single straight Incision depth: dermal Complexity: simple Drainage: purulent Drainage amount: copious Wound treatment: wound left open Comments: Anesthesia with free spray. Single puncture wound with 11 blade. Spontaneous drainage of copious  amount of purulent exudate.   (including critical care time)  Labs Review Labs Reviewed - No data to display  Imaging Review No results found.   Visual Acuity Review  Right Eye Distance:   Left Eye Distance:   Bilateral Distance:    Right Eye Near:   Left Eye Near:    Bilateral Near:         MDM   1. Infected cyst of skin    Incision and drainage successful with draining copious amount of purulence. Apply dressing. When you get home apply warm moist compresses off and on several times during the day for the next couple of days to allow drainage. For any worsening or new symptoms may return.    Janne Napoleon, NP 11/02/15 1723  Janne Napoleon, NP 11/02/15 1726

## 2015-11-02 NOTE — ED Notes (Signed)
Patient reports "knots" in ear lobes.  Reports a long history of this since childhood. Reports these bumps may rupture on their own after a few months.  Patient has squeezed them before and resulted in a skin infection.  Patient cleans with acne medicine and alcohol.

## 2015-12-02 ENCOUNTER — Telehealth: Payer: Self-pay | Admitting: Family Medicine

## 2015-12-02 DIAGNOSIS — N920 Excessive and frequent menstruation with regular cycle: Secondary | ICD-10-CM

## 2015-12-02 NOTE — Telephone Encounter (Signed)
Patient unable to complete visit with [facility] due to provider seen hasn't performed procedure for condition.  Would like to have another referral to a different gyn practice.  Can send to [facility] to see Dr. Baltazar Najjar.  Can call at  [phone].

## 2015-12-02 NOTE — Telephone Encounter (Signed)
Referral placed.

## 2015-12-06 ENCOUNTER — Encounter (HOSPITAL_COMMUNITY): Payer: Self-pay | Admitting: *Deleted

## 2015-12-06 ENCOUNTER — Emergency Department (HOSPITAL_COMMUNITY)
Admission: EM | Admit: 2015-12-06 | Discharge: 2015-12-06 | Disposition: A | Discharge status: Home / Self Care | Payer: Medicaid Other | Source: Home / Self Care

## 2015-12-06 ENCOUNTER — Inpatient Hospital Stay (HOSPITAL_COMMUNITY)
Admission: AD | Admit: 2015-12-06 | Discharge: 2015-12-06 | Disposition: A | Discharge status: Home / Self Care | Payer: Medicaid Other | Source: Ambulatory Visit | Attending: Obstetrics & Gynecology | Admitting: Obstetrics & Gynecology

## 2015-12-06 DIAGNOSIS — N946 Dysmenorrhea, unspecified: Secondary | ICD-10-CM | POA: Insufficient documentation

## 2015-12-06 DIAGNOSIS — Z8249 Family history of ischemic heart disease and other diseases of the circulatory system: Secondary | ICD-10-CM | POA: Insufficient documentation

## 2015-12-06 DIAGNOSIS — Z833 Family history of diabetes mellitus: Secondary | ICD-10-CM | POA: Diagnosis not present

## 2015-12-06 DIAGNOSIS — J45909 Unspecified asthma, uncomplicated: Secondary | ICD-10-CM | POA: Insufficient documentation

## 2015-12-06 DIAGNOSIS — F17210 Nicotine dependence, cigarettes, uncomplicated: Secondary | ICD-10-CM | POA: Insufficient documentation

## 2015-12-06 DIAGNOSIS — R102 Pelvic and perineal pain: Secondary | ICD-10-CM

## 2015-12-06 DIAGNOSIS — Z885 Allergy status to narcotic agent status: Secondary | ICD-10-CM | POA: Insufficient documentation

## 2015-12-06 LAB — URINALYSIS, ROUTINE W REFLEX MICROSCOPIC
Bilirubin Urine: NEGATIVE
Glucose, UA: NEGATIVE mg/dL
Hgb urine dipstick: NEGATIVE
Ketones, ur: NEGATIVE mg/dL
Leukocytes, UA: NEGATIVE
Nitrite: NEGATIVE
Protein, ur: NEGATIVE mg/dL
Specific Gravity, Urine: 1.015 (ref 1.005–1.030)
pH: 6.5 (ref 5.0–8.0)

## 2015-12-06 LAB — POCT PREGNANCY, URINE: Preg Test, Ur: NEGATIVE

## 2015-12-06 MED ORDER — IBUPROFEN 800 MG PO TABS: 800.0000 mg | ORAL_TABLET | Freq: Three times a day (TID) | ORAL | Status: DC

## 2015-12-06 NOTE — ED Notes (Signed)
Pt reports being on her menstrual, having severe cramping and "feels like her insides are going to fall out."  Has been seen by ob/gyn and diagnosed with severe PMS but pt reports its severe and unable to take it any longer. No acute distress noted at triage.

## 2015-12-06 NOTE — ED Notes (Signed)
The pt is tired of waiting  [facility]

## 2015-12-06 NOTE — Discharge Instructions (Signed)

## 2015-12-06 NOTE — MAU Note (Signed)
Patient states she is on her period and having severe cramping that "feels like my insides are going to fall out of my vagina."  Pt also states having severe diarrhea after eating and migraines that all occur around the time of her period.  It has been this way since Jan. 2016 after having her tubes tied.

## 2015-12-06 NOTE — MAU Provider Note (Signed)
History   [id number] not pregnant in with abd pain with menses. States menses are regular, but starts cramping two weeks before period starts.  CSN: [id number]  Arrival date & time 12/06/15  1548   First Provider Initiated Contact with Patient 12/06/15 1617      Chief Complaint  Patient presents with  . Dysmenorrhea    HPI  Past Medical History  Diagnosis Date  . PTSD (post-traumatic stress disorder)   . Asthma   . Anxiety   . History of seizure 06/20/2012    x 1 - during delivery of child(eclampsia)  . Bipolar disorder ([place])     no current med.  . Depression     no current med.  . Lipoma of lower back 08/2015  . Family history of adverse reaction to anesthesia     states mother and sister are hard to wake up post-op    Past Surgical History  Procedure Laterality Date  . Cesarean section  06/20/2012    Procedure: CESAREAN SECTION;  Surgeon: Emily Filbert, MD;  Location: [facility];  Service: Obstetrics;  Laterality: N/A;  . Adenoidectomy, tonsillectomy and myringotomy with tube placement  1990's  . Laparoscopic appendectomy  07/25/2012    Procedure: APPENDECTOMY LAPAROSCOPIC;  Surgeon: Zenovia Jarred, MD;  Location: [facility];  Service: General;  Laterality: N/A;  . Wisdom tooth extraction    . Laparoscopic tubal ligation Bilateral 08/04/2014    Procedure: BILATERAL LAPAROSCOPIC TUBAL LIGATION;  Surgeon: Woodroe Mode, MD;  Location: [facility];  Service: Gynecology;  Laterality: Bilateral;  . Lipoma excision N/A 09/16/2015    Procedure: EXCISION LIPOMA LUMBAR REGION;  Surgeon: Donnie Mesa, MD;  Location: [facility];  Service: General;  Laterality: N/A;    Family History  Problem Relation Age of Onset  . Breast cancer Paternal Grandmother   . Colon cancer Paternal Grandfather   . Colon cancer Maternal Grandfather   . Colon polyps Maternal Aunt   . Diabetes Mother   . Anesthesia problems Mother     hard to wake up post-op  . Diabetes Sister   . Anesthesia problems  Sister     hard to wake up post-op  . Diabetes Father   . Heart disease Father     Social History  Substance Use Topics  . Smoking status: Current Every Day Smoker -- 0.50 packs/day for 14 years    Types: Cigarettes  . Smokeless tobacco: Never Used     Comment: 1 cig./day  . Alcohol Use: No    OB History    Gravida Para Term Preterm AB TAB SAB Ectopic Multiple Living   3 3 2 1  0 0 0 0 0 3      Obstetric Comments   C-Section Indication: Non-reassuring fetal tracing, growth delay & marked proteinuria & Pre-Eclampsia Eclamptic Seizure during C-section delivery      Review of Systems  Constitutional: Negative.   HENT: Negative.   Eyes: Negative.   Respiratory: Negative.   Cardiovascular: Negative.   Gastrointestinal: Positive for abdominal pain.  Endocrine: Negative.   Genitourinary: Positive for vaginal bleeding.  Musculoskeletal: Negative.   Skin: Negative.   Allergic/Immunologic: Negative.   Neurological: Negative.   Hematological: Negative.   Psychiatric/Behavioral: Negative.     Allergies  Codeine  Home Medications   Current Outpatient Rx  Name  Route  Sig  Dispense  Refill  . ibuprofen (ADVIL,MOTRIN) 800 MG tablet   Oral   Take 1 tablet (800 mg total)  by mouth 3 (three) times daily.   40 tablet   0     BP 112/77 mmHg  Pulse 109  Resp 16  Ht 5\' 2"  (1.575 m)  Wt 161 lb (73.029 kg)  BMI 29.44 kg/m2  SpO2 98%  LMP 12/05/2015  Physical Exam  Constitutional: She is oriented to person, place, and time. She appears well-developed and well-nourished.  HENT:  Head: Normocephalic.  Eyes: Pupils are equal, round, and reactive to light.  Neck: Normal range of motion.  Cardiovascular: Normal rate, regular rhythm, normal heart sounds and intact distal pulses.   Pulmonary/Chest: Effort normal and breath sounds normal.  Abdominal: Soft. Bowel sounds are normal.  Genitourinary: Vagina normal and uterus normal.  Musculoskeletal: Normal range of motion.   Neurological: She is alert and oriented to person, place, and time. She has normal reflexes.  Skin: Skin is warm and dry.  Psychiatric: She has a normal mood and affect. Her behavior is normal. Judgment and thought content normal.    MAU Course  Procedures (including critical care time)  Labs Reviewed  URINALYSIS, ROUTINE W REFLEX MICROSCOPIC (NOT AT [facility])  GC/CHLAMYDIA PROBE AMP ([place]) NOT AT [facility]   No results found.   1. Dysmenorrhea       MDM  Dx: dysmenorrhea  Sterile spec exam done, cultures obtained. Cervix NSSC, uterus non tender, sm amt vag bleeding, no prolapse good support noted. Will give Rx for motirn 800 and pt is to f/u with Dr. Jodi Mourning whom she has appt with the first week in June.

## 2015-12-07 LAB — GC/CHLAMYDIA PROBE AMP (~~LOC~~) NOT AT ARMC
Chlamydia: NEGATIVE
Neisseria Gonorrhea: NEGATIVE

## 2015-12-09 ENCOUNTER — Other Ambulatory Visit: Payer: Self-pay | Admitting: Family Medicine

## 2016-01-06 ENCOUNTER — Emergency Department (HOSPITAL_COMMUNITY)
Admission: EM | Admit: 2016-01-06 | Discharge: 2016-01-06 | Disposition: A | Discharge status: Home / Self Care | Payer: Medicaid Other | Attending: Emergency Medicine | Admitting: Emergency Medicine

## 2016-01-06 ENCOUNTER — Encounter (HOSPITAL_COMMUNITY): Payer: Self-pay | Admitting: Emergency Medicine

## 2016-01-06 DIAGNOSIS — R000 Tachycardia, unspecified: Secondary | ICD-10-CM | POA: Insufficient documentation

## 2016-01-06 DIAGNOSIS — Z79899 Other long term (current) drug therapy: Secondary | ICD-10-CM | POA: Diagnosis not present

## 2016-01-06 DIAGNOSIS — R110 Nausea: Secondary | ICD-10-CM | POA: Insufficient documentation

## 2016-01-06 DIAGNOSIS — J45909 Unspecified asthma, uncomplicated: Secondary | ICD-10-CM | POA: Diagnosis not present

## 2016-01-06 DIAGNOSIS — F17210 Nicotine dependence, cigarettes, uncomplicated: Secondary | ICD-10-CM | POA: Insufficient documentation

## 2016-01-06 DIAGNOSIS — G43829 Menstrual migraine, not intractable, without status migrainosus: Secondary | ICD-10-CM | POA: Diagnosis not present

## 2016-01-06 DIAGNOSIS — G43909 Migraine, unspecified, not intractable, without status migrainosus: Secondary | ICD-10-CM | POA: Diagnosis present

## 2016-01-06 MED ORDER — ONDANSETRON 4 MG PO TBDP
4.0000 mg | ORAL_TABLET | Freq: Once | ORAL | Status: AC | PRN
  Administered 2016-01-06: 4 mg via ORAL

## 2016-01-06 MED ORDER — METOCLOPRAMIDE HCL 5 MG/ML IJ SOLN
10.0000 mg | INTRAMUSCULAR | Status: AC
  Administered 2016-01-06: 10 mg via INTRAVENOUS
  Filled 2016-01-06: qty 2

## 2016-01-06 MED ORDER — OXYCODONE-ACETAMINOPHEN 5-325 MG PO TABS
1.0000 | ORAL_TABLET | ORAL | Status: DC | PRN
  Administered 2016-01-06: 1 via ORAL

## 2016-01-06 MED ORDER — OXYCODONE-ACETAMINOPHEN 5-325 MG PO TABS
ORAL_TABLET | ORAL | Status: AC
  Filled 2016-01-06: qty 1

## 2016-01-06 MED ORDER — BUTALBITAL-APAP-CAFFEINE 50-325-40 MG PO TABS: 1.0000 | ORAL_TABLET | Freq: Three times a day (TID) | ORAL | Status: DC | PRN

## 2016-01-06 MED ORDER — ONDANSETRON 4 MG PO TBDP
ORAL_TABLET | ORAL | Status: DC
Start: 2016-01-06 — End: 2016-01-07
  Filled 2016-01-06: qty 1

## 2016-01-06 MED ORDER — KETOROLAC TROMETHAMINE 30 MG/ML IJ SOLN
30.0000 mg | Freq: Once | INTRAMUSCULAR | Status: AC
  Administered 2016-01-06: 30 mg via INTRAVENOUS
  Filled 2016-01-06: qty 1

## 2016-01-06 MED ORDER — SODIUM CHLORIDE 0.9 % IV BOLUS (SEPSIS)
1000.0000 mL | Freq: Once | INTRAVENOUS | Status: AC
  Administered 2016-01-06: 1000 mL via INTRAVENOUS

## 2016-01-06 NOTE — ED Notes (Signed)
Pt here woth worsening migraine since 10am today. Pt sts hx of same. Pt reports taking tylenol and ibuprofen without relief. Pt also reports nausea.

## 2016-01-06 NOTE — ED Provider Notes (Signed)
CSN: [id number]     Arrival date & time 01/06/16  1921 History   First MD Initiated Contact with Patient 01/06/16 2055     Chief Complaint  Patient presents with  . Migraine  . Nausea     (Consider location/radiation/quality/duration/timing/severity/associated sxs/prior Treatment) HPI Comments: 25 year old female with a history of PTSD, asthma, bipolar disorder, and depression presents to the emergency department for evaluation of migraine headache. She reports that symptoms began at 10 AM today and have been constant. Pain is located in her occipital region and has been unrelieved with Tylenol and ibuprofen taken prior to arrival. She states that she has had similar symptoms in the past associated with onset of her menstrual cycle. She is currently ending her menses. She has had no fever, head trauma, vision loss, hearing loss, extremity numbness, or extremity weakness. No syncope. She does complain of some nausea and mild dizziness which she often experiences with her migraines.  Patient is a 25 y.o. female presenting with migraines. The history is provided by the patient. No language interpreter was used.  Migraine Associated symptoms include headaches and nausea.    Past Medical History  Diagnosis Date  . PTSD (post-traumatic stress disorder)   . Asthma   . Anxiety   . History of seizure 06/20/2012    x 1 - during delivery of child(eclampsia)  . Bipolar disorder ([place])     no current med.  . Depression     no current med.  . Lipoma of lower back 08/2015  . Family history of adverse reaction to anesthesia     states mother and sister are hard to wake up post-op   Past Surgical History  Procedure Laterality Date  . Cesarean section  06/20/2012    Procedure: CESAREAN SECTION;  Surgeon: Emily Filbert, MD;  Location: [facility];  Service: Obstetrics;  Laterality: N/A;  . Adenoidectomy, tonsillectomy and myringotomy with tube placement  1990's  . Laparoscopic appendectomy  07/25/2012   Procedure: APPENDECTOMY LAPAROSCOPIC;  Surgeon: Zenovia Jarred, MD;  Location: [facility];  Service: General;  Laterality: N/A;  . Wisdom tooth extraction    . Laparoscopic tubal ligation Bilateral 08/04/2014    Procedure: BILATERAL LAPAROSCOPIC TUBAL LIGATION;  Surgeon: Woodroe Mode, MD;  Location: [facility];  Service: Gynecology;  Laterality: Bilateral;  . Lipoma excision N/A 09/16/2015    Procedure: EXCISION LIPOMA LUMBAR REGION;  Surgeon: Donnie Mesa, MD;  Location: [facility];  Service: General;  Laterality: N/A;   Family History  Problem Relation Age of Onset  . Breast cancer Paternal Grandmother   . Colon cancer Paternal Grandfather   . Colon cancer Maternal Grandfather   . Colon polyps Maternal Aunt   . Diabetes Mother   . Anesthesia problems Mother     hard to wake up post-op  . Diabetes Sister   . Anesthesia problems Sister     hard to wake up post-op  . Diabetes Father   . Heart disease Father    Social History  Substance Use Topics  . Smoking status: Current Every Day Smoker -- 0.50 packs/day for 14 years    Types: Cigarettes  . Smokeless tobacco: Never Used     Comment: 1 cig./day  . Alcohol Use: No   OB History    Gravida Para Term Preterm AB TAB SAB Ectopic Multiple Living   3 3 2 1  0 0 0 0 0 3      Obstetric Comments   C-Section Indication:  Non-reassuring fetal tracing, growth delay & marked proteinuria & Pre-Eclampsia Eclamptic Seizure during C-section delivery      Review of Systems  Gastrointestinal: Positive for nausea.  Neurological: Positive for dizziness and headaches.  All other systems reviewed and are negative.   Allergies  Codeine  Home Medications   Prior to Admission medications   Medication Sig Start Date End Date Taking? Authorizing Provider  acetaminophen (TYLENOL) 500 MG tablet Take 1,000 mg by mouth every 6 (six) hours as needed for mild pain.   Yes Historical Provider, MD  ibuprofen (ADVIL,MOTRIN) 800 MG tablet Take 1  tablet (800 mg total) by mouth 3 (three) times daily. 12/06/15  Yes Keitha Butte, CNM  oxyCODONE-acetaminophen (PERCOCET/ROXICET) 5-325 MG tablet Take 1 tablet by mouth every 4 (four) hours as needed for severe pain. 09/16/15  Yes Donnie Mesa, MD  VENTOLIN HFA 108 (90 Base) MCG/ACT inhaler INHALE 2 PUFFS BY MOUTH EVERY 4 HOURS AS NEEDED FOR WHEEZING OR SHORTNESS OF BREATH 12/14/15  Yes Patrecia Pour, MD  butalbital-acetaminophen-caffeine (FIORICET) [id number] MG tablet Take 1-2 tablets by mouth every 8 (eight) hours as needed for headache. 01/06/16 01/05/17  Antonietta Breach, PA-C  ibuprofen (ADVIL,MOTRIN) 800 MG tablet Take 1 tablet (800 mg total) by mouth 3 (three) times daily. 12/06/15   Keitha Butte, CNM   BP 95/59 mmHg  Pulse 79  Temp(Src) 98 F (36.7 C) (Oral)  Resp 20  Ht 5\' 2"  (1.575 m)  Wt 72.576 kg  BMI 29.26 kg/m2  SpO2 100%  LMP 01/06/2016   Physical Exam  Constitutional: She is oriented to person, place, and time. She appears well-developed and well-nourished. No distress.  Nontoxic-appearing and in no distress.  HENT:  Head: Normocephalic and atraumatic.  Mouth/Throat: Oropharynx is clear and moist.  Eyes: Conjunctivae and EOM are normal. No scleral icterus.  Neck: Normal range of motion.  No nuchal rigidity or meningismus  Cardiovascular: Regular rhythm and intact distal pulses.   Mild tachycardia. Heart rate 106.  Pulmonary/Chest: Effort normal and breath sounds normal. No respiratory distress. She has no wheezes. She has no rales.  Lungs clear to auscultation bilaterally.  Musculoskeletal: Normal range of motion.  Neurological: She is alert and oriented to person, place, and time. She exhibits normal muscle tone. Coordination normal.  GCS 15. Speech is goal oriented. No cranial nerve deficits appreciated; symmetric eyebrow raise, no facial drooping, tongue midline. Patient has 5/5 strength against resistance in all major muscle groups bilaterally. Sensation to light touch  intact. Patient moves extremities without ataxia.  Skin: Skin is warm and dry. No rash noted. She is not diaphoretic. No erythema. No pallor.  Psychiatric: She has a normal mood and affect. Her behavior is normal.  Nursing note and vitals reviewed.   ED Course  Procedures (including critical care time) Labs Review Labs Reviewed - No data to display  Imaging Review No results found. I have personally reviewed and evaluated these images and lab results as part of my medical decision-making.   EKG Interpretation None      MDM   Final diagnoses:  Menstrual migraine without status migrainosus, not intractable    25 year old female presents to the emergency department for evaluation of a migraine for day take. She has a history of similar headaches associated with cessation of her menstrual cycle. Patient is afebrile and without nuchal rigidity or meningismus today. She has a nonfocal neurologic exam.  Headache management Toradol and Reglan. Patient has had significant improvement in  her pain with this regimen. She states that she is feeling better. Patient has been a meal from [facility]. She desires further outpatient management at this time. Will discharge with your set. Return precautions discussed and provided. Patient discharged in satisfactory condition with no unaddressed concerns.   Filed Vitals:   01/06/16 2145 01/06/16 2247 01/06/16 2248 01/06/16 2249  BP: 101/68 95/59  95/59  Pulse: 84  81 79  Temp:      TempSrc:      Resp:    20  Height:      Weight:      SpO2: 99%  93% 100%     Antonietta Breach, PA-C 01/06/16 2306  Orlie Dakin, MD 01/06/16 2333

## 2016-01-06 NOTE — Discharge Instructions (Signed)

## 2016-01-13 ENCOUNTER — Encounter: Payer: Self-pay | Admitting: Certified Nurse Midwife

## 2016-01-13 ENCOUNTER — Ambulatory Visit (INDEPENDENT_AMBULATORY_CARE_PROVIDER_SITE_OTHER): Payer: Medicaid Other | Admitting: Certified Nurse Midwife

## 2016-01-13 VITALS — BP 123/79 | HR 105 | Ht 62.0 in | Wt 161.0 lb

## 2016-01-13 DIAGNOSIS — N939 Abnormal uterine and vaginal bleeding, unspecified: Secondary | ICD-10-CM | POA: Insufficient documentation

## 2016-01-13 DIAGNOSIS — G43701 Chronic migraine without aura, not intractable, with status migrainosus: Secondary | ICD-10-CM

## 2016-01-13 DIAGNOSIS — Z1389 Encounter for screening for other disorder: Secondary | ICD-10-CM

## 2016-01-13 DIAGNOSIS — Z9851 Tubal ligation status: Secondary | ICD-10-CM

## 2016-01-13 DIAGNOSIS — Z01419 Encounter for gynecological examination (general) (routine) without abnormal findings: Secondary | ICD-10-CM | POA: Diagnosis not present

## 2016-01-13 DIAGNOSIS — Z0000 Encounter for general adult medical examination without abnormal findings: Secondary | ICD-10-CM

## 2016-01-13 DIAGNOSIS — N946 Dysmenorrhea, unspecified: Secondary | ICD-10-CM | POA: Insufficient documentation

## 2016-01-13 LAB — POCT URINALYSIS DIPSTICK
Bilirubin, UA: NEGATIVE
Blood, UA: NEGATIVE
Glucose, UA: NEGATIVE
Ketones, UA: NEGATIVE
Leukocytes, UA: NEGATIVE
Nitrite, UA: NEGATIVE
Protein, UA: NEGATIVE
Spec Grav, UA: 1.01
Urobilinogen, UA: NEGATIVE
pH, UA: 7.5

## 2016-01-13 MED ORDER — DEXAMETHASONE 6 MG PO TABS: 6.0000 mg | ORAL_TABLET | Freq: Four times a day (QID) | ORAL | Status: DC | PRN

## 2016-01-13 MED ORDER — DIPHENHYDRAMINE HCL 25 MG PO TABS: 50.0000 mg | ORAL_TABLET | Freq: Four times a day (QID) | ORAL | Status: DC | PRN

## 2016-01-13 MED ORDER — VITAFOL GUMMIES 3.33-0.333-34.8 MG PO CHEW: 3.0000 | CHEWABLE_TABLET | Freq: Every day | ORAL | Status: DC

## 2016-01-13 MED ORDER — TRAMADOL HCL 50 MG PO TABS: 50.0000 mg | ORAL_TABLET | Freq: Four times a day (QID) | ORAL | Status: DC | PRN

## 2016-01-13 MED ORDER — HYDROMORPHONE HCL 2 MG PO TABS: 2.0000 mg | ORAL_TABLET | ORAL | Status: DC | PRN

## 2016-01-13 MED ORDER — TRANEXAMIC ACID 650 MG PO TABS: 1300.0000 mg | ORAL_TABLET | Freq: Three times a day (TID) | ORAL | Status: DC

## 2016-01-13 MED ORDER — ONDANSETRON HCL 4 MG PO TABS: 4.0000 mg | ORAL_TABLET | Freq: Three times a day (TID) | ORAL | Status: DC | PRN

## 2016-01-13 MED ORDER — SUMATRIPTAN SUCCINATE 100 MG PO TABS: 100.0000 mg | ORAL_TABLET | ORAL | Status: DC | PRN

## 2016-01-13 NOTE — Progress Notes (Signed)
Patient ID: Michelle [name], female   DOB: 26-Jul-1990, 25 y.o.   MRN: [id number]   Subjective:      Michelle [name] is a 25 y.o. female here for a routine exam.  Current complaints:   Patient reports having the following symptoms 2 weeks before cycle and diminishes when cycle is finished: tugging/pulling sensation in vagina, painful intercourse, heavy menstrual bleeding with varying between nickel and quarter size clots, severe cramps, migraines, constant fatigue, urinary frequency, urinary urgency, diarrhea and nausea.  Reports all of these symptoms have been bothering her for the last 2 years.  Reports occasional right breast pain, "like it was going to fall off."  Has not noticed any breast discomfort lately.  Today reports headache only.  Headeaches not relieved by Fioracet.  Feels intoxicated when using Fioracet.  States that one MD thought it could be endometriosis.  No known family history of endometriosis.  Denies nausea with normal headaches.  Reports nausea with migraines.  Due for next eye exam on 02-17-2016.  With last child, had preeclampsia.  Reports placenta previa with last child.  LMP was 9 days long, previous was 2 weeks long.  Tried Nexplanon & Mirena.  Periods went away with Mirena, but had mood issues.  Declines Mirena.  Nexplanon came out 2 years ago, caused periods to stop, but caused her hair to fall out.    Denies any STD history.  Reports past history of candida.  Personal health questionnaire:  Is patient Ashkenazi Jewish, have a family history of breast and/or ovarian cancer: no Is there a family history of uterine cancer diagnosed at age < 33, gastrointestinal cancer, urinary tract cancer, family member who is a Field seismologist syndrome-associated carrier: no Is the patient overweight and hypertensive, family history of diabetes, personal history of gestational diabetes, preeclampsia or PCOS: no Is patient over 86, have PCOS,  family history of premature CHD under age 20, diabetes,  smoke, have hypertension or peripheral artery disease:  no At any time, has a partner hit, kicked or otherwise hurt or frightened you?: no Over the past 2 weeks, have you felt down, depressed or hopeless?: no Over the past 2 weeks, have you felt little interest or pleasure in doing things?:no  PGM history of breast CA (deceased).  Gynecologic History Patient's last menstrual period was 01/06/2016. Contraception: tubal ligation in January 2016. Last Pap: ~4 years ago. Results were: abnormal Last mammogram: n/a. Results were: n/a  Obstetric History OB History  Gravida Para Term Preterm AB SAB TAB Ectopic Multiple Living  3 3 2 1  0 0 0 0 0 3    # Outcome Date GA Lbr Len/2nd Weight Sex Delivery Anes PTL Lv  3 Preterm 06/20/12 [redacted]w[redacted]d  3 lb 6.2 oz (1.537 kg) M CS-LTranv Spinal  Y  2 Term 12/26/07 [redacted]w[redacted]d  7 lb (3.175 kg)  Vag-Spont        Comments: Gestational Diabetes @ ~18 weeks; Followed at High Risk  1 Term 08/08/06 [redacted]w[redacted]d  6 lb 7 oz (2.92 kg)  Vag-Spont        Comments: Gestational Diabetes @ 17 weeks; followed at High Risk    Obstetric Comments  C-Section Indication: Non-reassuring fetal tracing, growth delay & marked proteinuria & Pre-Eclampsia  Eclamptic Seizure during C-section delivery    Past Medical History  Diagnosis Date  . PTSD (post-traumatic stress disorder)   . Asthma   . Anxiety   . History of seizure 06/20/2012    x 1 - during delivery  of child(eclampsia)  . Bipolar disorder ([place])     no current med.  . Depression     no current med.  . Lipoma of lower back 08/2015  . Family history of adverse reaction to anesthesia     states mother and sister are hard to wake up post-op    Past Surgical History  Procedure Laterality Date  . Cesarean section  06/20/2012    Procedure: CESAREAN SECTION;  Surgeon: Emily Filbert, MD;  Location: [facility];  Service: Obstetrics;  Laterality: N/A;  . Adenoidectomy, tonsillectomy and myringotomy with tube placement  1990's  . Laparoscopic  appendectomy  07/25/2012    Procedure: APPENDECTOMY LAPAROSCOPIC;  Surgeon: Zenovia Jarred, MD;  Location: [facility];  Service: General;  Laterality: N/A;  . Wisdom tooth extraction    . Laparoscopic tubal ligation Bilateral 08/04/2014    Procedure: BILATERAL LAPAROSCOPIC TUBAL LIGATION;  Surgeon: Woodroe Mode, MD;  Location: [facility];  Service: Gynecology;  Laterality: Bilateral;  . Lipoma excision N/A 09/16/2015    Procedure: EXCISION LIPOMA LUMBAR REGION;  Surgeon: Donnie Mesa, MD;  Location: [facility];  Service: General;  Laterality: N/A;     Current outpatient prescriptions:  .  butalbital-acetaminophen-caffeine (FIORICET) 50-325-40 MG tablet, Take 1-2 tablets by mouth every 8 (eight) hours as needed for headache., Disp: 20 tablet, Rfl: 0 .  acetaminophen (TYLENOL) 500 MG tablet, Take 1,000 mg by mouth every 6 (six) hours as needed for mild pain. Reported on 01/13/2016, Disp: , Rfl:  .  dexamethasone (DECADRON) 6 MG tablet, Take 1 tablet (6 mg total) by mouth 4 (four) times daily as needed. Take with benadryl, zofran and percocet for migraine HA, Disp: 30 tablet, Rfl: 1 .  diphenhydrAMINE (BENADRYL) 25 MG tablet, Take 2 tablets (50 mg total) by mouth every 6 (six) hours as needed., Disp: 90 tablet, Rfl: 0 .  HYDROmorphone (DILAUDID) 2 MG tablet, Take 1 tablet (2 mg total) by mouth every 4 (four) hours as needed for severe pain., Disp: 15 tablet, Rfl: 0 .  ibuprofen (ADVIL,MOTRIN) 800 MG tablet, Take 1 tablet (800 mg total) by mouth 3 (three) times daily. (Patient not taking: Reported on 01/13/2016), Disp: 40 tablet, Rfl: 0 .  ibuprofen (ADVIL,MOTRIN) 800 MG tablet, Take 1 tablet (800 mg total) by mouth 3 (three) times daily. (Patient not taking: Reported on 01/13/2016), Disp: 21 tablet, Rfl: 0 .  ondansetron (ZOFRAN) 4 MG tablet, Take 1 tablet (4 mg total) by mouth every 8 (eight) hours as needed for nausea or vomiting., Disp: 30 tablet, Rfl: 1 .  Prenatal Vit-Fe Phos-FA-Omega (VITAFOL  GUMMIES) 3.33-0.333-34.8 MG CHEW, Chew 3 tablets by mouth daily., Disp: 90 tablet, Rfl: PRN .  SUMAtriptan (IMITREX) 100 MG tablet, Take 1 tablet (100 mg total) by mouth every 2 (two) hours as needed for migraine. May repeat in 2 hours if headache persists or recurs., Disp: 10 tablet, Rfl: 1 .  traMADol (ULTRAM) 50 MG tablet, Take 1 tablet (50 mg total) by mouth every 6 (six) hours as needed., Disp: 60 tablet, Rfl: 0 .  tranexamic acid (LYSTEDA) 650 MG TABS tablet, Take 2 tablets (1,300 mg total) by mouth 3 (three) times daily., Disp: 30 tablet, Rfl: 4 .  VENTOLIN HFA 108 (90 Base) MCG/ACT inhaler, INHALE 2 PUFFS BY MOUTH EVERY 4 HOURS AS NEEDED FOR WHEEZING OR SHORTNESS OF BREATH (Patient not taking: Reported on 01/13/2016), Disp: 18 g, Rfl: 0 Allergies  Allergen Reactions  . Codeine Hives and Shortness  Of Breath    Social History  Substance Use Topics  . Smoking status: Current Every Day Smoker -- 0.50 packs/day for 14 years    Types: Cigarettes  . Smokeless tobacco: Never Used     Comment: less than 1 pack/day  . Alcohol Use: No    Family History  Problem Relation Age of Onset  . Breast cancer Paternal Grandmother   . Colon cancer Paternal Grandfather   . Colon cancer Maternal Grandfather   . Colon polyps Maternal Aunt   . Diabetes Mother   . Anesthesia problems Mother     hard to wake up post-op  . Diabetes Sister   . Anesthesia problems Sister     hard to wake up post-op  . Diabetes Father   . Heart disease Father       Review of Systems  Constitutional: negative for fatigue and weight loss, +HA Respiratory: negative for cough and wheezing Cardiovascular: negative for chest pain, fatigue and palpitations Gastrointestinal: negative for abdominal pain and change in bowel habits Musculoskeletal:negative for myalgias Neurological: negative for gait problems and tremors Behavioral/Psych: negative for abusive relationship, depression Endocrine: negative for temperature  intolerance   Genitourinary:+ for abnormal menstrual periods & dysmenorrhea, negative for: genital lesions, hot flashes, sexual problems and vaginal discharge Integument/breast: negative for breast lump, breast tenderness, nipple discharge and skin lesion(s)    Objective:       BP 123/79 mmHg  Pulse 105  Ht 5\' 2"  (1.575 m)  Wt 161 lb (73.029 kg)  BMI 29.44 kg/m2  LMP 01/06/2016 General:   alert  Skin:   no rash or abnormalities  Lungs:   clear to auscultation bilaterally  Heart:   regular rate and rhythm, S1, S2 normal, no murmur, click, rub or gallop  Breasts:   normal without suspicious masses, skin or nipple changes or axillary nodes  Abdomen:  normal findings: no organomegaly, soft, non-tender and no hernia  Pelvis:  External genitalia: normal general appearance Urinary system: urethral meatus normal and bladder without fullness, nontender Vaginal: normal without tenderness, induration or masses Cervix: normal appearance Adnexa: normal bimanual exam Uterus: anteverted and non-tender, slightly enlarged in size   Lab Review Urine pregnancy test Labs reviewed yes Radiologic studies reviewed yes  50% of 30 min visit spent on counseling and coordination of care.   Assessment:    Healthy female exam.    Headache / Migraine headache. AUB with menorrhagia Dysmenorrhea.   Plan:    Education reviewed: depression evaluation, low fat, low cholesterol diet, safe sex/STD prevention, self breast exams and weight bearing exercise. Contraception: tubal ligation. Schedule consult for endometrial ablation.  Neuro consult. Lysteda RX. Prenatal gummy vitamin RX. Migraine cocktail RX  Meds ordered this encounter  Medications  . tranexamic acid (LYSTEDA) 650 MG TABS tablet    Sig: Take 2 tablets (1,300 mg total) by mouth 3 (three) times daily.    Dispense:  30 tablet    Refill:  4  . Prenatal Vit-Fe Phos-FA-Omega (VITAFOL GUMMIES) 3.33-0.333-34.8 MG CHEW    Sig: Chew 3 tablets  by mouth daily.    Dispense:  90 tablet    Refill:  PRN  . SUMAtriptan (IMITREX) 100 MG tablet    Sig: Take 1 tablet (100 mg total) by mouth every 2 (two) hours as needed for migraine. May repeat in 2 hours if headache persists or recurs.    Dispense:  10 tablet    Refill:  1  . dexamethasone (DECADRON) 6 MG tablet  Sig: Take 1 tablet (6 mg total) by mouth 4 (four) times daily as needed. Take with benadryl, zofran and percocet for migraine HA    Dispense:  30 tablet    Refill:  1  . diphenhydrAMINE (BENADRYL) 25 MG tablet    Sig: Take 2 tablets (50 mg total) by mouth every 6 (six) hours as needed.    Dispense:  90 tablet    Refill:  0  . ondansetron (ZOFRAN) 4 MG tablet    Sig: Take 1 tablet (4 mg total) by mouth every 8 (eight) hours as needed for nausea or vomiting.    Dispense:  30 tablet    Refill:  1  . HYDROmorphone (DILAUDID) 2 MG tablet    Sig: Take 1 tablet (2 mg total) by mouth every 4 (four) hours as needed for severe pain.    Dispense:  15 tablet    Refill:  0  . traMADol (ULTRAM) 50 MG tablet    Sig: Take 1 tablet (50 mg total) by mouth every 6 (six) hours as needed.    Dispense:  60 tablet    Refill:  0   Orders Placed This Encounter  Procedures  . US Pelvis Complete    Standing Status: Future     Number of Occurrences:      Standing Expiration Date: 03/14/2017    Order Specific Question:  Reason for Exam (SYMPTOM  OR DIAGNOSIS REQUIRED)    Answer:  AUB    Order Specific Question:  Preferred imaging location?    Answer:  Internal  . US Transvaginal Non-OB    Standing Status: Future     Number of Occurrences:      Standing Expiration Date: 03/14/2017    Order Specific Question:  Reason for Exam (SYMPTOM  OR DIAGNOSIS REQUIRED)    Answer:  AUB    Order Specific Question:  Preferred imaging location?    Answer:  Internal  . CBC with Differential/Platelet  . Comprehensive metabolic panel  . TSH  . Ambulatory referral to Neurology    Referral Priority:   Routine    Referral Type:  Consultation    Referral Reason:  Specialty Services Required    Requested Specialty:  Neurology    Number of Visits Requested:  1  . POCT urinalysis dipstick   Need to obtain previous records Possible management options include: hysterectomy, endomentrial laproscopic exploration, oral contraception, endometrial ablation. Follow up as needed.

## 2016-01-14 LAB — COMPREHENSIVE METABOLIC PANEL
ALT: 12 IU/L (ref 0–32)
AST: 14 IU/L (ref 0–40)
Albumin/Globulin Ratio: 1.4 (ref 1.2–2.2)
Albumin: 4.6 g/dL (ref 3.5–5.5)
Alkaline Phosphatase: 97 IU/L (ref 39–117)
BUN/Creatinine Ratio: 9 (ref 9–23)
BUN: 6 mg/dL (ref 6–20)
Bilirubin Total: 0.2 mg/dL (ref 0.0–1.2)
CO2: 22 mmol/L (ref 18–29)
Calcium: 9.5 mg/dL (ref 8.7–10.2)
Chloride: 101 mmol/L (ref 96–106)
Creatinine, Ser: 0.69 mg/dL (ref 0.57–1.00)
GFR calc Af Amer: 139 mL/min/{1.73_m2} (ref >59)
GFR calc non Af Amer: 121 mL/min/{1.73_m2} (ref >59)
Globulin, Total: 3.2 g/dL (ref 1.5–4.5)
Glucose: 97 mg/dL (ref 65–99)
Potassium: 4.4 mmol/L (ref 3.5–5.2)
Sodium: 140 mmol/L (ref 134–144)
Total Protein: 7.8 g/dL (ref 6.0–8.5)

## 2016-01-14 LAB — CBC WITH DIFFERENTIAL/PLATELET
Basophils Absolute: 0 10*3/uL (ref 0.0–0.2)
Basos: 0 %
EOS (ABSOLUTE): 0.1 10*3/uL (ref 0.0–0.4)
Eos: 1 %
Hematocrit: 45 % (ref 34.0–46.6)
Hemoglobin: 14.7 g/dL (ref 11.1–15.9)
Immature Grans (Abs): 0 10*3/uL (ref 0.0–0.1)
Immature Granulocytes: 0 %
Lymphocytes Absolute: 5.2 10*3/uL — ABNORMAL HIGH (ref 0.7–3.1)
Lymphs: 33 %
MCH: 31 pg (ref 26.6–33.0)
MCHC: 32.7 g/dL (ref 31.5–35.7)
MCV: 95 fL (ref 79–97)
Monocytes Absolute: 1 10*3/uL — ABNORMAL HIGH (ref 0.1–0.9)
Monocytes: 6 %
Neutrophils Absolute: 9.6 10*3/uL — ABNORMAL HIGH (ref 1.4–7.0)
Neutrophils: 60 %
Platelets: 281 10*3/uL (ref 150–379)
RBC: 4.74 x10E6/uL (ref 3.77–5.28)
RDW: 12.7 % (ref 12.3–15.4)
WBC: 16 10*3/uL — ABNORMAL HIGH (ref 3.4–10.8)

## 2016-01-14 LAB — TSH: TSH: 1.12 u[IU]/mL (ref 0.450–4.500)

## 2016-01-17 LAB — NUSWAB VG+, CANDIDA 6SP
Atopobium vaginae: HIGH Score — AB
Candida albicans, NAA: NEGATIVE
Candida glabrata, NAA: NEGATIVE
Candida krusei, NAA: NEGATIVE
Candida lusitaniae, NAA: NEGATIVE
Candida parapsilosis, NAA: NEGATIVE
Candida tropicalis, NAA: NEGATIVE
Chlamydia trachomatis, NAA: NEGATIVE
Neisseria gonorrhoeae, NAA: NEGATIVE
Trich vag by NAA: NEGATIVE

## 2016-01-19 ENCOUNTER — Other Ambulatory Visit (HOSPITAL_COMMUNITY): Payer: Self-pay | Admitting: Certified Nurse Midwife

## 2016-01-19 LAB — PAP IG W/ RFLX HPV ASCU: PAP Smear Comment: 0

## 2016-01-22 ENCOUNTER — Encounter (HOSPITAL_COMMUNITY): Payer: Self-pay | Admitting: Emergency Medicine

## 2016-01-22 ENCOUNTER — Emergency Department (HOSPITAL_COMMUNITY): Payer: Medicaid Other

## 2016-01-22 ENCOUNTER — Emergency Department (HOSPITAL_COMMUNITY)
Admission: EM | Admit: 2016-01-22 | Discharge: 2016-01-23 | Disposition: A | Discharge status: Home / Self Care | Payer: Medicaid Other | Attending: Emergency Medicine | Admitting: Emergency Medicine

## 2016-01-22 DIAGNOSIS — J45909 Unspecified asthma, uncomplicated: Secondary | ICD-10-CM | POA: Insufficient documentation

## 2016-01-22 DIAGNOSIS — Z79899 Other long term (current) drug therapy: Secondary | ICD-10-CM | POA: Diagnosis not present

## 2016-01-22 DIAGNOSIS — K5900 Constipation, unspecified: Secondary | ICD-10-CM | POA: Insufficient documentation

## 2016-01-22 DIAGNOSIS — R0602 Shortness of breath: Secondary | ICD-10-CM | POA: Diagnosis present

## 2016-01-22 DIAGNOSIS — J180 Bronchopneumonia, unspecified organism: Secondary | ICD-10-CM | POA: Diagnosis not present

## 2016-01-22 DIAGNOSIS — F17210 Nicotine dependence, cigarettes, uncomplicated: Secondary | ICD-10-CM | POA: Diagnosis not present

## 2016-01-22 LAB — URINALYSIS, ROUTINE W REFLEX MICROSCOPIC
Bilirubin Urine: NEGATIVE
Glucose, UA: NEGATIVE mg/dL
Hgb urine dipstick: NEGATIVE
Ketones, ur: NEGATIVE mg/dL
Leukocytes, UA: NEGATIVE
Nitrite: NEGATIVE
Protein, ur: NEGATIVE mg/dL
Specific Gravity, Urine: 1.016 (ref 1.005–1.030)
pH: 7.5 (ref 5.0–8.0)

## 2016-01-22 LAB — COMPREHENSIVE METABOLIC PANEL
ALT: 15 U/L (ref 14–54)
AST: 17 U/L (ref 15–41)
Albumin: 3.2 g/dL — ABNORMAL LOW (ref 3.5–5.0)
Alkaline Phosphatase: 70 U/L (ref 38–126)
Anion gap: 10 (ref 5–15)
BUN: 14 mg/dL (ref 6–20)
CO2: 22 mmol/L (ref 22–32)
Calcium: 9 mg/dL (ref 8.9–10.3)
Chloride: 105 mmol/L (ref 101–111)
Creatinine, Ser: 0.89 mg/dL (ref 0.44–1.00)
GFR calc Af Amer: >60 mL/min (ref >60)
GFR calc non Af Amer: >60 mL/min (ref >60)
Glucose, Bld: 127 mg/dL — ABNORMAL HIGH (ref 65–99)
Potassium: 3.2 mmol/L — ABNORMAL LOW (ref 3.5–5.1)
Sodium: 137 mmol/L (ref 135–145)
Total Bilirubin: 0.4 mg/dL (ref 0.3–1.2)
Total Protein: 6 g/dL — ABNORMAL LOW (ref 6.5–8.1)

## 2016-01-22 LAB — CBC
HCT: 41.1 % (ref 36.0–46.0)
Hemoglobin: 13.5 g/dL (ref 12.0–15.0)
MCH: 31.1 pg (ref 26.0–34.0)
MCHC: 32.8 g/dL (ref 30.0–36.0)
MCV: 94.7 fL (ref 78.0–100.0)
Platelets: 253 10*3/uL (ref 150–400)
RBC: 4.34 MIL/uL (ref 3.87–5.11)
RDW: 12.9 % (ref 11.5–15.5)
WBC: 29.2 10*3/uL — ABNORMAL HIGH (ref 4.0–10.5)

## 2016-01-22 LAB — I-STAT TROPONIN, ED: Troponin i, poc: 0 ng/mL (ref 0.00–0.08)

## 2016-01-22 LAB — LIPASE, BLOOD: Lipase: 22 U/L (ref 11–51)

## 2016-01-22 MED ORDER — FAMOTIDINE IN NACL 20-0.9 MG/50ML-% IV SOLN
20.0000 mg | Freq: Once | INTRAVENOUS | Status: AC
  Administered 2016-01-23: 20 mg via INTRAVENOUS
  Filled 2016-01-22: qty 50

## 2016-01-22 MED ORDER — SODIUM CHLORIDE 0.9 % IV BOLUS (SEPSIS)
1000.0000 mL | Freq: Once | INTRAVENOUS | Status: AC
Start: 2016-01-22 — End: 2016-01-23
  Administered 2016-01-23: 1000 mL via INTRAVENOUS

## 2016-01-22 MED ORDER — MORPHINE SULFATE (PF) 4 MG/ML IV SOLN
4.0000 mg | Freq: Once | INTRAVENOUS | Status: AC
  Administered 2016-01-23: 4 mg via INTRAVENOUS
  Filled 2016-01-22: qty 1

## 2016-01-22 NOTE — ED Provider Notes (Signed)
CSN: [id number]     Arrival date & time 01/22/16  2047 History   First MD Initiated Contact with Patient 01/22/16 2223     Chief Complaint  Patient presents with  . Shortness of Breath  . Constipation     (Consider location/radiation/quality/duration/timing/severity/associated sxs/prior Treatment) The history is provided by the patient and medical records. No language interpreter was used.     Michelle [name] is a 25 y.o. female  with a hx of PTSD, asthma, bipolar disorder, and depression presents to the Emergency Department complaining of gradual, persistent, progressively worsening epigastric and RUQ abd pain onset 3 days ago. Pt reports the symptoms are significantly worse after eating. She has had several episodes of emesis.  She also reports the pain radiates into her mid back. No ripping or tearing sensations. She reports 24 hours of loose stools 4 days ago and has not had a BM since that time.  No laxative usage.  No alleviating factors.  Pt denies measured fever, chills, neck pain, neck stiffness, chest pain, weakness, dizziness, syncope, dysuria.  Pt reports daily headaches and intermittent migraines.  She reports being prescribed dexamethasone for her headaches and has been taking it daily for the last 7-10 days.  She also reports her boyfriend has been sick and she has therefore been eating a lot of fast food in the last week.  She has a hx of endometriosis, c-section, BTL and appendectomy.  She is not currently being treated for GERD.  Pt denies hematemesis, melena or hematochezia.  She denies recent travel, surgery, fractures, estrogen usage or history of DVT.   Past Medical History  Diagnosis Date  . PTSD (post-traumatic stress disorder)   . Asthma   . Anxiety   . History of seizure 06/20/2012    x 1 - during delivery of child(eclampsia)  . Bipolar disorder ([place])     no current med.  . Depression     no current med.  . Lipoma of lower back 08/2015  . Family history of adverse  reaction to anesthesia     states mother and sister are hard to wake up post-op   Past Surgical History  Procedure Laterality Date  . Cesarean section  06/20/2012    Procedure: CESAREAN SECTION;  Surgeon: Emily Filbert, MD;  Location: [facility];  Service: Obstetrics;  Laterality: N/A;  . Adenoidectomy, tonsillectomy and myringotomy with tube placement  1990's  . Laparoscopic appendectomy  07/25/2012    Procedure: APPENDECTOMY LAPAROSCOPIC;  Surgeon: Zenovia Jarred, MD;  Location: [facility];  Service: General;  Laterality: N/A;  . Wisdom tooth extraction    . Laparoscopic tubal ligation Bilateral 08/04/2014    Procedure: BILATERAL LAPAROSCOPIC TUBAL LIGATION;  Surgeon: Woodroe Mode, MD;  Location: [facility];  Service: Gynecology;  Laterality: Bilateral;  . Lipoma excision N/A 09/16/2015    Procedure: EXCISION LIPOMA LUMBAR REGION;  Surgeon: Donnie Mesa, MD;  Location: [facility];  Service: General;  Laterality: N/A;   Family History  Problem Relation Age of Onset  . Breast cancer Paternal Grandmother   . Colon cancer Paternal Grandfather   . Colon cancer Maternal Grandfather   . Colon polyps Maternal Aunt   . Diabetes Mother   . Anesthesia problems Mother     hard to wake up post-op  . Diabetes Sister   . Anesthesia problems Sister     hard to wake up post-op  . Diabetes Father   . Heart disease Father  Social History  Substance Use Topics  . Smoking status: Current Every Day Smoker -- 0.50 packs/day for 14 years    Types: Cigarettes  . Smokeless tobacco: Never Used     Comment: less than 1 pack/day  . Alcohol Use: No   OB History    Gravida Para Term Preterm AB TAB SAB Ectopic Multiple Living   3 3 2 1  0 0 0 0 0 3      Obstetric Comments   C-Section Indication: Non-reassuring fetal tracing, growth delay & marked proteinuria & Pre-Eclampsia Eclamptic Seizure during C-section delivery     Review of Systems  Constitutional: Positive for fatigue. Negative for fever,  diaphoresis, appetite change and unexpected weight change.  HENT: Negative for mouth sores.   Eyes: Negative for visual disturbance.  Respiratory: Positive for shortness of breath. Negative for cough, chest tightness and wheezing.   Cardiovascular: Negative for chest pain.  Gastrointestinal: Positive for nausea, vomiting, abdominal pain, diarrhea and constipation.  Endocrine: Negative for polydipsia, polyphagia and polyuria.  Genitourinary: Negative for dysuria, urgency, frequency and hematuria.  Musculoskeletal: Negative for back pain and neck stiffness.  Skin: Negative for rash.  Allergic/Immunologic: Negative for immunocompromised state.  Neurological: Negative for syncope, light-headedness and headaches.  Hematological: Does not bruise/bleed easily.  Psychiatric/Behavioral: Negative for sleep disturbance. The patient is not nervous/anxious.       Allergies  Codeine  Home Medications   Prior to Admission medications   Medication Sig Start Date End Date Taking? Authorizing Provider  bismuth subsalicylate (PEPTO BISMOL) 262 MG/15ML suspension Take 30 mLs by mouth every 6 (six) hours as needed for indigestion or diarrhea or loose stools.    Yes Historical Provider, MD  dexamethasone (DECADRON) 6 MG tablet Take 1 tablet (6 mg total) by mouth 4 (four) times daily as needed. Take with benadryl, zofran and percocet for migraine HA 01/13/16  Yes Rachelle A Denney, CNM  diphenhydrAMINE (BENADRYL) 25 MG tablet Take 2 tablets (50 mg total) by mouth every 6 (six) hours as needed. Patient taking differently: Take 50 mg by mouth every 6 (six) hours as needed (for migraines).  01/13/16  Yes Rachelle A Denney, CNM  simethicone (MYLICON) 0000000 MG chewable tablet Chew 125 mg by mouth every 6 (six) hours as needed for flatulence.   Yes Historical Provider, MD  traMADol (ULTRAM) 50 MG tablet Take 1 tablet (50 mg total) by mouth every 6 (six) hours as needed. 01/13/16  Yes Rachelle A Denney, CNM  VENTOLIN  HFA 108 (90 Base) MCG/ACT inhaler INHALE 2 PUFFS BY MOUTH EVERY 4 HOURS AS NEEDED FOR WHEEZING OR SHORTNESS OF BREATH 12/14/15  Yes Patrecia Pour, MD  butalbital-acetaminophen-caffeine (FIORICET) [phone] MG tablet Take 1-2 tablets by mouth every 8 (eight) hours as needed for headache. 01/06/16 01/05/17  Antonietta Breach, PA-C  HYDROmorphone (DILAUDID) 2 MG tablet Take 1 tablet (2 mg total) by mouth every 4 (four) hours as needed for severe pain. 01/13/16   Rachelle A Denney, CNM  ibuprofen (ADVIL,MOTRIN) 800 MG tablet Take 1 tablet (800 mg total) by mouth 3 (three) times daily. Patient not taking: Reported on 01/13/2016 12/06/15   Keitha Butte, CNM  ibuprofen (ADVIL,MOTRIN) 800 MG tablet Take 1 tablet (800 mg total) by mouth 3 (three) times daily. Patient not taking: Reported on 01/13/2016 12/06/15   Keitha Butte, CNM  ondansetron [facility]) 4 MG tablet Take 1 tablet (4 mg total) by mouth every 8 (eight) hours as needed for nausea or vomiting. 01/13/16  Morene Crocker, CNM  Prenatal Vit-Fe Phos-FA-Omega (VITAFOL GUMMIES) 3.33-0.333-34.8 MG CHEW Chew 3 tablets by mouth daily. 01/13/16   Rachelle A Denney, CNM  SUMAtriptan (IMITREX) 100 MG tablet Take 1 tablet (100 mg total) by mouth every 2 (two) hours as needed for migraine. May repeat in 2 hours if headache persists or recurs. 01/13/16   Rachelle A Denney, CNM  tranexamic acid (LYSTEDA) 650 MG TABS tablet Take 2 tablets (1,300 mg total) by mouth 3 (three) times daily. 01/13/16   Rachelle A Denney, CNM   BP 112/78 mmHg  Pulse 105  Temp(Src) 99.2 F (37.3 C) (Rectal)  Resp 24  Ht 5\' 2"  (1.575 m)  Wt 72.576 kg  BMI 29.26 kg/m2  SpO2 97%  LMP 01/06/2016 Physical Exam  Constitutional: She appears well-developed and well-nourished. She appears distressed.  Awake, alert, very uncomfortable appearing  HENT:  Head: Normocephalic and atraumatic.  Mouth/Throat: Oropharynx is clear and moist. No oropharyngeal exudate.  Eyes: Conjunctivae are normal. No  scleral icterus.  Neck: Normal range of motion. Neck supple.  Cardiovascular: Regular rhythm, normal heart sounds and intact distal pulses.  Tachycardia present.   Pulses:      Radial pulses are 2+ on the right side, and 2+ on the left side.  Pulmonary/Chest: Effort normal and breath sounds normal. No respiratory distress. She has no wheezes.  Equal chest expansion  Abdominal: Soft. Bowel sounds are normal. She exhibits no distension and no mass. There is tenderness in the right upper quadrant and epigastric area. There is guarding and positive Murphy's sign. There is no rebound, no CVA tenderness and no tenderness at McBurney's point.  Musculoskeletal: Normal range of motion. She exhibits no edema.  Neurological: She is alert.  Speech is clear and goal oriented Moves extremities without ataxia  Skin: Skin is warm and dry. No rash noted. She is not diaphoretic. No erythema.  Psychiatric: She has a normal mood and affect.  Nursing note and vitals reviewed.   ED Course  Procedures (including critical care time) Labs Review Labs Reviewed  COMPREHENSIVE METABOLIC PANEL - Abnormal; Notable for the following:    Potassium 3.2 (*)    Glucose, Bld 127 (*)    Total Protein 6.0 (*)    Albumin 3.2 (*)    All other components within normal limits  CBC - Abnormal; Notable for the following:    WBC 29.2 (*)    All other components within normal limits  LIPASE, BLOOD  URINALYSIS, ROUTINE W REFLEX MICROSCOPIC (NOT AT [facility])  PREGNANCY, URINE  I-STAT TROPOININ, ED    Imaging Review Dg Chest 2 View  01/22/2016  CLINICAL DATA:  Acute onset of shortness of breath. Initial encounter. EXAM: CHEST  2 VIEW COMPARISON:  Chest radiograph performed 07/22/2012 FINDINGS: The lungs are well-aerated and clear. There is no evidence of focal opacification, pleural effusion or pneumothorax. The heart is normal in size; the mediastinal contour is within normal limits. No acute osseous abnormalities are seen.  IMPRESSION: No acute cardiopulmonary process seen. Electronically Signed   By: Garald Balding M.D.   On: 01/22/2016 21:18   US Abdomen Complete  01/23/2016  CLINICAL DATA:  Right upper quadrant and epigastric pain radiating to the back. EXAM: ABDOMEN ULTRASOUND COMPLETE COMPARISON:  CT 07/25/2012 FINDINGS: Gallbladder: Only partially distended. No gallstones or wall thickening visualized, wall thickness of 2 mm. No sonographic Murphy sign noted by sonographer. Common bile duct: Diameter: 3.4 cm, normal Liver: No focal lesion identified. Heterogeneous and increased in  parenchymal echogenicity. Normal directional flow in the main portal vein. IVC: No abnormality visualized. Pancreas: Majority obscured by bowel gas. Spleen: Size and appearance within normal limits. Right Kidney: Length: 14 cm. No mass or hydronephrosis visualized. There is increased echogenicity of the renal pyramids. Probable shadowing calculus in the upper pole measuring 7 mm. Left Kidney: Length: 11.6 cm. No mass or hydronephrosis visualized. Increased echogenicity of renal pyramids. Abdominal aorta: No aneurysm visualized. Other findings: None. IMPRESSION: 1. Contracted gallbladder, however no gallstones or focal tenderness. 2. Echogenic renal pyramids are nonspecific, can be seen with cases of hypercalcemia, renal tubular acidosis or other metabolic abnormalities. Probable nonobstructing right renal stone. Electronically Signed   By: Jeb Levering M.D.   On: 01/23/2016 00:33   I have personally reviewed and evaluated these images and lab results as part of my medical decision-making.   EKG Interpretation   Date/Time:  Saturday January 22 2016 20:57:29 EDT Ventricular Rate:  124 PR Interval:  112 QRS Duration: 68 QT Interval:  296 QTC Calculation: 425 R Axis:   80 Text Interpretation:  Sinus tachycardia Nonspecific T wave abnormality  Abnormal ECG When compared with ECG of 11/22/2011, HEART RATE has increased  Nonspecific T wave  abnormality is now Present Confirmed by [facility]  MD,  DAVID ([id number]) on 01/22/2016 11:36:56 PM      MDM   Final diagnoses:  Epigastric abdominal pain  SOB (shortness of breath)  Tachycardia   Michelle [name] presents with epigastric abd pain, SOB and tachycardia.  Pt is uncomfortable in the room.  Guarding on abd exam, but also tachypneic.  Labs with Significant leukocytosis however this is contributed to by her dexamethasone usage.  Troponin is negative. Lipase within normal limits. No elevation in AST/ALT. Urinalysis without evidence of UTI and pregnancy test negative.  EKG shows sinus tachycardia.  Mild hypokalemia will need repletion.  Chest x-ray without acute cardiopulmonary process.  Dominant ultrasound shows no evidence of cholecystitis. Patient continues to be in significant pain with nausea and persistent tachycardia after fluids. Will obtain CT scan of her chest and abdomen to assess for PE and other etiology of her pain.  1:30 AM At Shift change care transferred to Delos Haring, PA-C who will follow CT scan, reassess and disposition accordingly.    Jarrett Soho Ozella Comins, PA-C [phone]  Delora Fuel, MD [phone]

## 2016-01-22 NOTE — ED Notes (Signed)
Pt states "the past hree days on and off whenever i eat i feel like something is pushing on my lungs and i cant breathe". Pt also c/o diarrhea. Pt states "no mater what i just cant breathe". Pt respirations equal and unlabored, pt is tearful. Pt also c/o constipation. "I cant stop shaking ive been shaking for like a week".

## 2016-01-22 NOTE — ED Notes (Signed)
PA at bedside.

## 2016-01-22 NOTE — ED Notes (Signed)
Patient transported to Ultrasound 

## 2016-01-23 ENCOUNTER — Emergency Department (HOSPITAL_COMMUNITY): Payer: Medicaid Other

## 2016-01-23 LAB — PREGNANCY, URINE: Preg Test, Ur: NEGATIVE

## 2016-01-23 MED ORDER — ALBUTEROL SULFATE HFA 108 (90 BASE) MCG/ACT IN AERS
2.0000 | INHALATION_SPRAY | RESPIRATORY_TRACT | Status: DC | PRN
  Administered 2016-01-23: 2 via RESPIRATORY_TRACT
  Filled 2016-01-23: qty 6.7

## 2016-01-23 MED ORDER — MORPHINE SULFATE (PF) 4 MG/ML IV SOLN
4.0000 mg | Freq: Once | INTRAVENOUS | Status: AC
Start: 2016-01-23 — End: 2016-01-23
  Administered 2016-01-23: 4 mg via INTRAVENOUS
  Filled 2016-01-23: qty 1

## 2016-01-23 MED ORDER — AZITHROMYCIN 250 MG PO TABS: 250.0000 mg | ORAL_TABLET | Freq: Every day | ORAL | Status: DC

## 2016-01-23 MED ORDER — AZITHROMYCIN 250 MG PO TABS
500.0000 mg | ORAL_TABLET | Freq: Once | ORAL | Status: AC
  Administered 2016-01-23: 500 mg via ORAL
  Filled 2016-01-23: qty 2

## 2016-01-23 MED ORDER — ONDANSETRON HCL 4 MG/2ML IJ SOLN
4.0000 mg | Freq: Once | INTRAMUSCULAR | Status: AC
  Administered 2016-01-23: 4 mg via INTRAVENOUS
  Filled 2016-01-23: qty 2

## 2016-01-23 MED ORDER — IOPAMIDOL (ISOVUE-370) INJECTION 76%
INTRAVENOUS | Status: AC
  Filled 2016-01-23: qty 100

## 2016-01-23 MED ORDER — HYDROCODONE-ACETAMINOPHEN 5-325 MG PO TABS: 1.0000 | ORAL_TABLET | ORAL | Status: DC | PRN

## 2016-01-23 MED ORDER — SODIUM CHLORIDE 0.9 % IV BOLUS (SEPSIS)
1000.0000 mL | Freq: Once | INTRAVENOUS | Status: AC
  Administered 2016-01-23: 1000 mL via INTRAVENOUS

## 2016-01-23 NOTE — ED Notes (Signed)
Patient transported to CT 

## 2016-01-23 NOTE — ED Provider Notes (Signed)
Patient seen by Abigail Butts, PA-C and signed out at shift change. She is waiting for CT angio of the chest and CT abd/pelv w/ contrast.  The patient presented to the Er because she was having pain to midback, RUQ pain, and SOB. She was also tachycardic and uncomfortable on arrival.   IMPRESSION: CTA CHEST: No acute pulmonary embolism.  Bronchopneumonia.  CT ABDOMEN AND PELVIS: 3 cm LEFT adnexal hemorrhagic cyst.  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  Moderate amount of retained large bowel stool without bowel obstruction. Status post appendectomy.   Electronically Signed By: Elon Alas M.D. On: 01/23/2016 05:47    I got patient out of bed and ambulated her myself and rechecked her BP. 113/73 at discharge, pulse ox is 97 % on room air. She is not in any distress. Her pain is controlled.   Medications  iopamidol (ISOVUE-370) 76 % injection (not administered)  azithromycin (ZITHROMAX) tablet 500 mg (not administered)  albuterol (PROVENTIL HFA;VENTOLIN HFA) 108 (90 Base) MCG/ACT inhaler 2 puff (not administered)  sodium chloride 0.9 % bolus 1,000 mL (0 mLs Intravenous Stopped 01/23/16 0220)  morphine 4 MG/ML injection 4 mg (4 mg Intravenous Given 01/23/16 0045)  famotidine (PEPCID) IVPB 20 mg premix (0 mg Intravenous Stopped 01/23/16 0118)  sodium chloride 0.9 % bolus 1,000 mL (0 mLs Intravenous Stopped 01/23/16 0420)  ondansetron (ZOFRAN) injection 4 mg (4 mg Intravenous Given 01/23/16 0053)  morphine 4 MG/ML injection 4 mg (4 mg Intravenous Given 01/23/16 0253)   Will start on HFA albuterol, Azithromycin and pain medication.  ] Patient has been diagnosed with CAP via CT angio chest. Pt is not ill appearing, immunocompromised, and does not have multiple co morbidities, therefore I feel like the they can be treated as an OP with abx therapy. Pt has been advised to return to the ED if symptoms worsen or they do not improve. Pt verbalizes understanding and is  agreeable with plan.     Delos Haring, PA-C [phone] [id number]  Delora Fuel, MD [phone]

## 2016-01-23 NOTE — ED Notes (Signed)
Pt departed in NAD, refused wheelchair.

## 2016-01-23 NOTE — Discharge Instructions (Signed)

## 2016-01-24 ENCOUNTER — Ambulatory Visit (INDEPENDENT_AMBULATORY_CARE_PROVIDER_SITE_OTHER): Payer: Medicaid Other | Admitting: Family Medicine

## 2016-01-24 ENCOUNTER — Ambulatory Visit (HOSPITAL_COMMUNITY)
Admission: RE | Admit: 2016-01-24 | Discharge: 2016-01-24 | Disposition: A | Discharge status: Home / Self Care | Payer: Medicaid Other | Source: Ambulatory Visit | Attending: Family Medicine | Admitting: Family Medicine

## 2016-01-24 VITALS — BP 114/76 | HR 123 | Temp 98.2°F | Wt 167.4 lb

## 2016-01-24 DIAGNOSIS — Z720 Tobacco use: Secondary | ICD-10-CM | POA: Insufficient documentation

## 2016-01-24 DIAGNOSIS — S93602D Unspecified sprain of left foot, subsequent encounter: Secondary | ICD-10-CM | POA: Diagnosis not present

## 2016-01-24 DIAGNOSIS — R071 Chest pain on breathing: Secondary | ICD-10-CM | POA: Diagnosis not present

## 2016-01-24 DIAGNOSIS — R079 Chest pain, unspecified: Secondary | ICD-10-CM | POA: Diagnosis present

## 2016-01-24 DIAGNOSIS — R000 Tachycardia, unspecified: Secondary | ICD-10-CM | POA: Diagnosis not present

## 2016-01-24 LAB — ECHOCARDIOGRAM COMPLETE: Weight: 2678.4 oz

## 2016-01-24 MED ORDER — ALBUTEROL SULFATE (2.5 MG/3ML) 0.083% IN NEBU
2.5000 mg | INHALATION_SOLUTION | Freq: Once | RESPIRATORY_TRACT | Status: AC
  Administered 2016-01-24: 2.5 mg via RESPIRATORY_TRACT

## 2016-01-24 MED ORDER — IOPAMIDOL (ISOVUE-370) INJECTION 76%
100.0000 mL | Freq: Once | INTRAVENOUS | Status: AC | PRN
  Administered 2016-01-23: 100 mL via INTRAVENOUS

## 2016-01-24 MED ORDER — PANTOPRAZOLE SODIUM 40 MG PO TBEC: 80.0000 mg | DELAYED_RELEASE_TABLET | Freq: Every day | ORAL | Status: DC

## 2016-01-24 MED ORDER — BENZONATATE 100 MG PO CAPS: 200.0000 mg | ORAL_CAPSULE | Freq: Three times a day (TID) | ORAL | Status: DC | PRN

## 2016-01-24 NOTE — Progress Notes (Signed)
  Echocardiogram 2D Echocardiogram has been performed.  Jennette Dubin 01/24/2016, 2:47 PM

## 2016-01-24 NOTE — Patient Instructions (Addendum)
It was a pleasure seeing you today in our clinic. Today we discussed your chest pain. Here is the treatment plan we have discussed and agreed upon together:   - I prescribed Protonix. Take 2 tablets once a day until this prescription is empty. - I prescribed Tessalon Perles. Take these as needed for cough. - I like for you to get a neck of cardiogram. My staff for be attempting to set this up before he leaves today. Getting this as soon as possible will help [place] determine how to treat you from here. - I will contact you with the results of this echocardiogram by the of the day today.

## 2016-01-24 NOTE — Addendum Note (Signed)
Addended by: Christen Bame D on: 01/24/2016 04:03 PM   Modules accepted: Orders

## 2016-01-24 NOTE — Assessment & Plan Note (Signed)
Patient here in obvious discomfort. Dry cough noted and patient has significant tachycardia. Etiology currently unknown. There is some aspect of muscle fatigue/soreness from her deep coughing. I believe this is contributing to some of her posterior thoracic pain. The pain noted at the xiphoid process is concerning. Differential for this pain currently includes pericarditis, GI ulcer, GERD, pancreatitis, PE. 2 days ago patient was seen in ED and imaging and labs showed no evidence of pancreatitis or PE. Physical exam today shows improved symptoms with leaning forward. This is suggestive of pericarditis however in the ED patient's EKG did not show evidence of this etiology. Due to patient's severe pain it is unlikely that this is secondary to GERD however gastric ulcer cannot be ruled out at this time. - Stat echocardiogram. - High-dose PPI 8 weeks - Patient is to return for follow-up tomorrow. - If no evidence of pericarditis on echocardiogram I would consider testing for H. pylori infection. At that time and FOBT may be appropriate and possible initiation of triple therapy for a GI ulceration.  [Precepted with Dr. Ree Kida

## 2016-01-24 NOTE — Progress Notes (Signed)
CHEST PAIN Wednesday first noted w/ diarrhea, abdominal pain. Noticed that she was having a hard time eating, felt she was "suffocating" every time she ate. Cough started Saturday morning. No fever. Abdom pain located at the xyphoid process. Sharp pain in Abd. Stabbing pain in back.   Pain is: sharp How severe is the pain: 8/10 What worsens the pain: eating, laying down, cough What makes the pain better: nothing Radiation: no  PMH History of blood clot or heart problems or aneurysms: no  Symptoms Nausea/vomiting: no Shortness of breath: some Pleuritic pain: yes Cough: yes Swelling of legs: no Syncope: no Heart burn or food sticking: yes Immobility: no  Review of Symptoms - see HPI PMH - Smoking status noted.    CC, SH/smoking status, and VS noted  Objective: BP 114/76 mmHg  Pulse 123  Temp(Src) 98.2 F (36.8 C) (Oral)  Wt 167 lb 6.4 oz (75.932 kg)  SpO2 97%  LMP 01/06/2016 Gen: NAD, alert, cooperative, in obvious comfort HEENT: NCAT, EOMI, PERRL, MMM, no LAD, OP clear, no rhinorrhea CV: Tachycardic, normal rhythm, no murmur, no obvious rubs Resp: CTAB, no wheezes, non-labored, deep dry cough present Abd: Soft, nondistended, significant tenderness at the xiphoid process, BS present, some mild guarding. No organomegaly Ext: No edema, warm Neuro: Alert and oriented, Speech clear, No gross deficits  Assessment and plan:  Chest pain on breathing Patient here in obvious discomfort. Dry cough noted and patient has significant tachycardia. Etiology currently unknown. There is some aspect of muscle fatigue/soreness from her deep coughing. I believe this is contributing to some of her posterior thoracic pain. The pain noted at the xiphoid process is concerning. Differential for this pain currently includes pericarditis, GI ulcer, GERD, pancreatitis, PE. 2 days ago patient was seen in ED and imaging and labs showed no evidence of pancreatitis or PE. Physical exam today shows  improved symptoms with leaning forward. This is suggestive of pericarditis however in the ED patient's EKG did not show evidence of this etiology. Due to patient's severe pain it is unlikely that this is secondary to GERD however gastric ulcer cannot be ruled out at this time. - Stat echocardiogram. - High-dose PPI 8 weeks - Patient is to return for follow-up tomorrow. - If no evidence of pericarditis on echocardiogram I would consider testing for H. pylori infection. At that time and FOBT may be appropriate and possible initiation of triple therapy for a GI ulceration.  [Precepted with Dr. Ree Kida    Orders Placed This Encounter  Procedures  . ECHOCARDIOGRAM COMPLETE    Standing Status: Future     Number of Occurrences:      Standing Expiration Date: 04/25/2017    Scheduling Instructions:     Concern for pericarditits    Order Specific Question:  Where should this test be performed    Answer:  [facility]    Order Specific Question:  Complete or Limited study?    Answer:  Complete    Order Specific Question:  Does the patient have a known history of hypersensitivity to Perflutren (aka Scientist, research (medical) for echocardiograms - CHECK ALLERGIES)    Answer:  No    Order Specific Question:  ADMINISTER PERFLUTERN    Answer:  ADMINISTER PERFLUTREN    Order Specific Question:  Expected Date:    Answer:  ASAP    Order Specific Question:  Reason for exam-Echo    Answer:  Chest Pain  786.50 / R07.9    Meds ordered this encounter  Medications  . pantoprazole (PROTONIX) 40 MG tablet    Sig: Take 2 tablets (80 mg total) by mouth daily.    Dispense:  120 tablet    Refill:  0  . benzonatate (TESSALON PERLES) 100 MG capsule    Sig: Take 2 capsules (200 mg total) by mouth 3 (three) times daily as needed for cough.    Dispense:  45 capsule    Refill:  0     Elberta Leatherwood, MD,MS,  [id number] 01/24/2016 1:44 PM

## 2016-01-25 ENCOUNTER — Encounter: Payer: Self-pay | Admitting: Internal Medicine

## 2016-01-25 ENCOUNTER — Ambulatory Visit (INDEPENDENT_AMBULATORY_CARE_PROVIDER_SITE_OTHER): Payer: Medicaid Other | Admitting: Internal Medicine

## 2016-01-25 VITALS — BP 111/71 | HR 105 | Temp 98.2°F | Wt 165.8 lb

## 2016-01-25 DIAGNOSIS — K219 Gastro-esophageal reflux disease without esophagitis: Secondary | ICD-10-CM

## 2016-01-25 DIAGNOSIS — S93602D Unspecified sprain of left foot, subsequent encounter: Secondary | ICD-10-CM | POA: Diagnosis not present

## 2016-01-25 DIAGNOSIS — R071 Chest pain on breathing: Secondary | ICD-10-CM

## 2016-01-25 DIAGNOSIS — J690 Pneumonitis due to inhalation of food and vomit: Secondary | ICD-10-CM

## 2016-01-25 DIAGNOSIS — J189 Pneumonia, unspecified organism: Secondary | ICD-10-CM

## 2016-01-25 LAB — POCT H PYLORI SCREEN: H Pylori Screen, POC: NEGATIVE

## 2016-01-25 MED ORDER — LEVOFLOXACIN 750 MG PO TABS: 750.0000 mg | ORAL_TABLET | Freq: Every day | ORAL | Status: DC

## 2016-01-25 MED ORDER — GUAIFENESIN-CODEINE 100-10 MG/5ML PO SOLN: 5.0000 mL | Freq: Four times a day (QID) | ORAL | Status: DC | PRN

## 2016-01-25 NOTE — Progress Notes (Signed)
Subjective:    Michelle [name] - 25 y.o. female MRN [id number]  Date of birth: 03-20-1991  HPI  KENZLY KESTEN is here for follow up of chest pain and cough.  Reports that her pain has remained about the same. Located in the epigastric area as well as diffusely in sternum. Describes cough as "can't catch her breath kind". Is having some associated SOB as well. Has pain across her back. Has been taking Azithromycin (2 pills left) due to PNA diagnosed in ED. Has been using Albuterol inhaler. Started Protonix and Benzonatate prescribed by [employer] yesterday.   Initially had nausea which has resolved. Has some cramping in lower abdomen likely related to anticipated onset of menses. Has continued to be afebrile at home. Difficulty sleeping 2/2 to cough and pain. Has been taking Tylenol and Ibuprofen with no relief. Has been unable to smoke cigarettes due to her cough.   -  reports that she has been smoking Cigarettes.  She has a 7 pack-year smoking history. She has never used smokeless tobacco. - Review of Systems: Per HPI. - Past Medical History: Patient Active Problem List   Diagnosis Date Noted  . Chest pain on breathing 01/24/2016  . Abnormal uterine bleeding (AUB) 01/13/2016  . Dysmenorrhea 01/13/2016  . H/O tubal ligation 01/13/2016  . Malaise 10/19/2015  . Lipoma of back 08/23/2015  . Gingivitis 08/19/2015  . Encounter for smoking cessation counseling 08/19/2015  . Paronychia 11/10/2014  . Neck pain 10/08/2014  . Benign paroxysmal positional vertigo 02/14/2013  . 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 Visual field defect 06/23/2012  . Menorrhagia 10/28/2010  . PTSD 03/02/2010  . Tobacco use disorder 02/07/2008  . PAP SMEAR, LGSIL, ABNORMAL 11/27/2006  . 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   - Medications: reviewed and updated    Objective:   Physical Exam BP 111/71 mmHg  Pulse 105  Temp(Src) 98.2 F (36.8 C) (Oral)  Wt 165 lb 12.8 oz (75.206 kg)  SpO2 95%  LMP  01/06/2016 Gen: NAD, alert, cooperative with exam, appears uncomfortable  HEENT: NCAT, PERRL, mild scleral injection bilaterally, oropharynx clear, supple neck CV: Mildly tachycardic, normal rhythm, no murmur, no appreciable rubs, reproducible chest pain diffusely over sternum  Resp: CTABL, no wheezes, non-labored, dry cough present worsened with inspiration and speaking  Abd: tenderness in the epigastric region, +bs, no guarding present, soft, ND  Skin: no rashes, normal turgor  Neuro: no gross deficits.  Psych: good insight, alert and oriented    Assessment & Plan:   Chest pain on breathing Patient still in obvious discomfort. Very persistent dry cough noted. Tachycardia present but improved from OV yesterday. Etiology remains unknown. Likely some aspect of muscle soreness from coughing. Less likely to be pericarditis given recent normal EKG and Echo results. CT was without evidence of PE or pancreatitis. GERD and gastric ulcer remain on differential for cause of pain. Patient currently being treated for PNA with Azithromycin. Potentially inadequate antibiotic treatment as explanation for symptoms. Precepted this patient with Dr. Ree Kida.  -continue with high dose PPI, H pylori ordered today -broaden antibiotic to Levaquin  -Rx for cough syrup with codeine ordered; discussed allergy to codeine documented and patient reports occurred when she was a young child, likely that she will not have reaction given that she has tolerated percocet well; however discussed return precautions, partner will be present when she takes the medication and they wish to try codeine cough syrup rather than more percocet  -patient with  previous negative HIV results 4 years ago, will order again to ensure case not complicated by immunocompromise  -f/u on Friday or Monday (may cancel appointment if feeling better)       Phill Myron, D.O. 01/25/2016, 10:58 AM PGY-2, [place]

## 2016-01-25 NOTE — Assessment & Plan Note (Addendum)
Patient still in obvious discomfort. Very persistent dry cough noted. Tachycardia present but improved from OV yesterday. Etiology remains unknown. Likely some aspect of muscle soreness from coughing. Less likely to be pericarditis given recent normal EKG and Echo results. CT was without evidence of PE or pancreatitis. GERD and gastric ulcer remain on differential for cause of pain. Patient currently being treated for PNA with Azithromycin. Potentially inadequate antibiotic treatment as explanation for symptoms. Reassured from pulmonary standpoint that patient is able to maintain adequate O2 saturations (95-97%) on RA and while speaking. Precepted this patient with Dr. Ree Kida.  -continue with high dose PPI, H pylori ordered today -broaden antibiotic to Levaquin  -Rx for cough syrup with codeine ordered; discussed allergy to codeine documented and patient reports occurred when she was a young child, likely that she will not have reaction given that she has tolerated percocet well; however discussed return precautions, partner will be present when she takes the medication and they wish to try codeine cough syrup rather than more percocet  -patient with previous negative HIV results 4 years ago, will order again to ensure case not complicated by immunocompromise  -f/u on Friday or Monday (may cancel appointment if feeling better)  -if pain persists despite antibiotic treatment and respiratory supportive care measures, may benefit from cardiology referral

## 2016-01-25 NOTE — Patient Instructions (Signed)
Start the other antibiotic (Levaquin) today and take for 5 days. Finish the Azithromycin. Use the albuterol, cough syrup, and cough pills as needed.   Make an appointment for Friday or Monday. If you are feeling better by then you can cancel the appointment.

## 2016-01-26 ENCOUNTER — Telehealth: Payer: Self-pay | Admitting: *Deleted

## 2016-01-26 LAB — HIV ANTIBODY (ROUTINE TESTING W REFLEX): HIV 1&2 Ab, 4th Generation: NONREACTIVE

## 2016-01-26 NOTE — Telephone Encounter (Signed)
-----   Message from Nicolette Bang, DO sent at 01/26/2016  8:41 AM EDT ----- Please call patient to let her know H pylori and HIV both negative.   Phill Myron, D.O. 01/26/2016, 8:40 AM PGY-2, [employer]

## 2016-01-26 NOTE — Telephone Encounter (Signed)
Pt informed of below. Zimmerman Rumple, April D, CMA  

## 2016-01-31 ENCOUNTER — Ambulatory Visit (INDEPENDENT_AMBULATORY_CARE_PROVIDER_SITE_OTHER): Payer: Medicaid Other | Admitting: Obstetrics

## 2016-01-31 ENCOUNTER — Encounter: Payer: Self-pay | Admitting: Obstetrics

## 2016-01-31 VITALS — Ht 62.0 in | Wt 168.0 lb

## 2016-01-31 DIAGNOSIS — N946 Dysmenorrhea, unspecified: Secondary | ICD-10-CM | POA: Diagnosis not present

## 2016-01-31 DIAGNOSIS — R896 Abnormal cytological findings in specimens from other organs, systems and tissues: Secondary | ICD-10-CM | POA: Diagnosis not present

## 2016-01-31 DIAGNOSIS — IMO0002 Reserved for concepts with insufficient information to code with codable children: Secondary | ICD-10-CM

## 2016-01-31 DIAGNOSIS — N939 Abnormal uterine and vaginal bleeding, unspecified: Secondary | ICD-10-CM

## 2016-01-31 DIAGNOSIS — Z9851 Tubal ligation status: Secondary | ICD-10-CM

## 2016-01-31 MED ORDER — MEDROXYPROGESTERONE ACETATE 10 MG PO TABS: 10.0000 mg | ORAL_TABLET | Freq: Every day | ORAL | Status: DC

## 2016-01-31 MED ORDER — IBUPROFEN 800 MG PO TABS: 800.0000 mg | ORAL_TABLET | Freq: Three times a day (TID) | ORAL | Status: DC | PRN

## 2016-01-31 MED ORDER — OXYCODONE-ACETAMINOPHEN 10-325 MG PO TABS: 1.0000 | ORAL_TABLET | ORAL | Status: DC | PRN

## 2016-01-31 NOTE — Progress Notes (Signed)
Patient ID: Michelle [name], female   DOB: 06-Dec-1990, 25 y.o.   MRN: [id number]  No chief complaint on file.   HPI Michelle [name] is a 25 y.o. female.  Heavy and painful periods.  Wants treatment for heavy bleeding and pain with periods.  HPI  Past Medical History  Diagnosis Date  . PTSD (post-traumatic stress disorder)   . Asthma   . Anxiety   . History of seizure 06/20/2012    x 1 - during delivery of child(eclampsia)  . Bipolar disorder ([place])     no current med.  . Depression     no current med.  . Lipoma of lower back 08/2015  . Family history of adverse reaction to anesthesia     states mother and sister are hard to wake up post-op    Past Surgical History  Procedure Laterality Date  . Cesarean section  06/20/2012    Procedure: CESAREAN SECTION;  Surgeon: Emily Filbert, MD;  Location: [facility];  Service: Obstetrics;  Laterality: N/A;  . Adenoidectomy, tonsillectomy and myringotomy with tube placement  1990's  . Laparoscopic appendectomy  07/25/2012    Procedure: APPENDECTOMY LAPAROSCOPIC;  Surgeon: Zenovia Jarred, MD;  Location: [facility];  Service: General;  Laterality: N/A;  . Wisdom tooth extraction    . Laparoscopic tubal ligation Bilateral 08/04/2014    Procedure: BILATERAL LAPAROSCOPIC TUBAL LIGATION;  Surgeon: Woodroe Mode, MD;  Location: [facility];  Service: Gynecology;  Laterality: Bilateral;  . Lipoma excision N/A 09/16/2015    Procedure: EXCISION LIPOMA LUMBAR REGION;  Surgeon: Donnie Mesa, MD;  Location: [facility];  Service: General;  Laterality: N/A;    Family History  Problem Relation Age of Onset  . Breast cancer Paternal Grandmother   . Colon cancer Paternal Grandfather   . Colon cancer Maternal Grandfather   . Colon polyps Maternal Aunt   . Diabetes Mother   . Anesthesia problems Mother     hard to wake up post-op  . Diabetes Sister   . Anesthesia problems Sister     hard to wake up post-op  . Diabetes Father   . Heart disease Father      Social History Social History  Substance Use Topics  . Smoking status: Current Every Day Smoker -- 0.50 packs/day for 14 years    Types: Cigarettes  . Smokeless tobacco: Never Used     Comment: less than 1 pack/day  . Alcohol Use: No    Allergies  Allergen Reactions  . Codeine Hives and Shortness Of Breath    Current Outpatient Prescriptions  Medication Sig Dispense Refill  . dexamethasone (DECADRON) 6 MG tablet Take 1 tablet (6 mg total) by mouth 4 (four) times daily as needed. Take with benadryl, zofran and percocet for migraine HA 30 tablet 1  . diphenhydrAMINE (BENADRYL) 25 MG tablet Take 2 tablets (50 mg total) by mouth every 6 (six) hours as needed. (Patient taking differently: Take 50 mg by mouth every 6 (six) hours as needed (for migraines). ) 90 tablet 0  . pantoprazole (PROTONIX) 40 MG tablet Take 2 tablets (80 mg total) by mouth daily. 120 tablet 0  . traMADol (ULTRAM) 50 MG tablet Take 1 tablet (50 mg total) by mouth every 6 (six) hours as needed. 60 tablet 0  . tranexamic acid (LYSTEDA) 650 MG TABS tablet Take 2 tablets (1,300 mg total) by mouth 3 (three) times daily. 30 tablet 4  . VENTOLIN HFA 108 (90 Base) MCG/ACT  inhaler INHALE 2 PUFFS BY MOUTH EVERY 4 HOURS AS NEEDED FOR WHEEZING OR SHORTNESS OF BREATH 18 g 0  . azithromycin (ZITHROMAX) 250 MG tablet Take 1 tablet (250 mg total) by mouth daily. 1 tab every day until finished. (Patient not taking: Reported on 01/31/2016) 4 tablet 0  . benzonatate (TESSALON PERLES) 100 MG capsule Take 2 capsules (200 mg total) by mouth 3 (three) times daily as needed for cough. (Patient not taking: Reported on 01/31/2016) 45 capsule 0  . bismuth subsalicylate (PEPTO BISMOL) 262 MG/15ML suspension Take 30 mLs by mouth every 6 (six) hours as needed for indigestion or diarrhea or loose stools. Reported on 01/31/2016    . butalbital-acetaminophen-caffeine (FIORICET) 50-325-40 MG tablet Take 1-2 tablets by mouth every 8 (eight) hours as needed  for headache. (Patient not taking: Reported on 01/31/2016) 20 tablet 0  . guaiFENesin-codeine 100-10 MG/5ML syrup Take 5 mLs by mouth every 6 (six) hours as needed for cough. (Patient not taking: Reported on 01/31/2016) 118 mL 0  . HYDROcodone-acetaminophen (NORCO/VICODIN) 5-325 MG tablet Take 1-2 tablets by mouth every 4 (four) hours as needed. (Patient not taking: Reported on 01/31/2016) 10 tablet 0  . HYDROmorphone (DILAUDID) 2 MG tablet Take 1 tablet (2 mg total) by mouth every 4 (four) hours as needed for severe pain. (Patient not taking: Reported on 01/31/2016) 15 tablet 0  . ibuprofen (ADVIL,MOTRIN) 800 MG tablet Take 1 tablet (800 mg total) by mouth 3 (three) times daily. (Patient not taking: Reported on 01/13/2016) 40 tablet 0  . ibuprofen (ADVIL,MOTRIN) 800 MG tablet Take 1 tablet (800 mg total) by mouth 3 (three) times daily. (Patient not taking: Reported on 01/13/2016) 21 tablet 0  . ibuprofen (ADVIL,MOTRIN) 800 MG tablet Take 1 tablet (800 mg total) by mouth every 8 (eight) hours as needed. 30 tablet 5  . levofloxacin (LEVAQUIN) 750 MG tablet Take 1 tablet (750 mg total) by mouth daily. (Patient not taking: Reported on 01/31/2016) 5 tablet 0  . medroxyPROGESTERone (PROVERA) 10 MG tablet Take 1 tablet (10 mg total) by mouth daily. 30 tablet 0  . ondansetron (ZOFRAN) 4 MG tablet Take 1 tablet (4 mg total) by mouth every 8 (eight) hours as needed for nausea or vomiting. (Patient not taking: Reported on 01/31/2016) 30 tablet 1  . oxyCODONE-acetaminophen (PERCOCET) 10-325 MG tablet Take 1 tablet by mouth every 4 (four) hours as needed for pain. 40 tablet 0  . Prenatal Vit-Fe Phos-FA-Omega (VITAFOL GUMMIES) 3.33-0.333-34.8 MG CHEW Chew 3 tablets by mouth daily. (Patient not taking: Reported on 01/31/2016) 90 tablet PRN  . simethicone (MYLICON) 0000000 MG chewable tablet Chew 125 mg by mouth every 6 (six) hours as needed for flatulence. Reported on 01/31/2016    . SUMAtriptan (IMITREX) 100 MG tablet Take 1  tablet (100 mg total) by mouth every 2 (two) hours as needed for migraine. May repeat in 2 hours if headache persists or recurs. (Patient not taking: Reported on 01/31/2016) 10 tablet 1   No current facility-administered medications for this visit.    Review of Systems Review of Systems Constitutional: negative for fatigue and weight loss Respiratory: negative for cough and wheezing Cardiovascular: negative for chest pain, fatigue and palpitations Gastrointestinal: negative for abdominal pain and change in bowel habits Genitourinary:positive for heavy and painful periods Integument/breast: negative for nipple discharge Musculoskeletal:negative for myalgias Neurological: negative for gait problems and tremors Behavioral/Psych: negative for abusive relationship, depression Endocrine: negative for temperature intolerance     Height 5\' 2"  (1.575 m), weight  168 lb (76.204 kg), last menstrual period 01/26/2016.  Physical Exam Physical Exam:  Deferred  100% of 10 min visit spent on counseling and coordination of care.   Data Reviewed Labs Ultrasound  Assessment     LGSIL AUB - hormonal imbalance.  Wants Endometrial Ablation.  Has PSH of  tubal ligation.    Plan    F/U in 2 weeks for colposcopy. Will schedule Ablation after 30 days of Provera endometrial preparation. Provera Rx   No orders of the defined types were placed in this encounter.   Meds ordered this encounter  Medications  . oxyCODONE-acetaminophen (PERCOCET) 10-325 MG tablet    Sig: Take 1 tablet by mouth every 4 (four) hours as needed for pain.    Dispense:  40 tablet    Refill:  0  . medroxyPROGESTERone (PROVERA) 10 MG tablet    Sig: Take 1 tablet (10 mg total) by mouth daily.    Dispense:  30 tablet    Refill:  0  . ibuprofen (ADVIL,MOTRIN) 800 MG tablet    Sig: Take 1 tablet (800 mg total) by mouth every 8 (eight) hours as needed.    Dispense:  30 tablet    Refill:  5

## 2016-02-03 ENCOUNTER — Ambulatory Visit (INDEPENDENT_AMBULATORY_CARE_PROVIDER_SITE_OTHER): Payer: Medicaid Other

## 2016-02-03 ENCOUNTER — Other Ambulatory Visit: Payer: Self-pay | Admitting: Certified Nurse Midwife

## 2016-02-03 DIAGNOSIS — N83202 Unspecified ovarian cyst, left side: Secondary | ICD-10-CM

## 2016-02-03 DIAGNOSIS — N939 Abnormal uterine and vaginal bleeding, unspecified: Secondary | ICD-10-CM

## 2016-02-07 ENCOUNTER — Ambulatory Visit (INDEPENDENT_AMBULATORY_CARE_PROVIDER_SITE_OTHER): Payer: Medicaid Other | Admitting: Family Medicine

## 2016-02-07 ENCOUNTER — Encounter: Payer: Self-pay | Admitting: Family Medicine

## 2016-02-07 ENCOUNTER — Telehealth: Payer: Self-pay | Admitting: *Deleted

## 2016-02-07 DIAGNOSIS — B370 Candidal stomatitis: Secondary | ICD-10-CM | POA: Insufficient documentation

## 2016-02-07 DIAGNOSIS — S93602D Unspecified sprain of left foot, subsequent encounter: Secondary | ICD-10-CM | POA: Diagnosis not present

## 2016-02-07 MED ORDER — NYSTATIN 100000 UNIT/ML MT SUSP: 5.0000 mL | Freq: Four times a day (QID) | OROMUCOSAL | Status: DC

## 2016-02-07 NOTE — Progress Notes (Signed)
.    Subjective:    Patient ID: Michelle [name], female    DOB: September 15, 1990, 25 y.o.   MRN: [id number]   CC: Oral and throat pain   HPI: Michelle [name] is a 25 yo female with a past medical history significant for PTSD, BPPV, bipolar disorder, depression, anxiety and asthma who presented today with oral and throat pain. Patient reports symptoms started about a week ago, after she completed a week of azithromycin and levaquin for pneumonia. Patient states that pain is most severe when she eats mostly on her tongue and feel like she has multiple cuts on her tongue and "someone is pouring salt on them". Patient has not found any alleviating factor to her pain. Patient denies  Fever, chills, nasal discharge, cough, abdominal pain, diarrhea, shortness of breath, rash and myalgias. Of note patient had a non reactive HIV test on 01/25/2016.  Smoking status reviewed  Review of Systems      Objective:  BP 121/77   Pulse (!) 112   Temp 98.5 F (36.9 C) (Oral)   Wt 170 lb (77.1 kg)   LMP 01/26/2016   SpO2 99%   BMI 31.09 kg/m  Vitals and nursing note reviewed  General: NAD, able to participate in exam HEENT: Oral mucosa is non erythematous, thrush on tongue appreciated on exam, non erythematous pharynx Cardiac: RRR, normal heart sounds, no murmurs. 2+ radial and PT pulses bilaterally Respiratory: CTAB, normal effort Abdomen: soft, nontender, nondistended, no hepatic or splenomegaly. Bowel sounds present Extremities: no edema or cyanosis. WWP. Skin: warm and dry, no rashes noted Neuro: alert and oriented, no focal deficits   Assessment & Plan:    Thrush, oral Patient presented with oral pain mostly localized to her tongue. On exam, patient's tongue was coated with a white film consistent with symptoms description. No erythema or exudates noted on exam. Patient has just completed a course of antibiotics azithromycin and levaquin for pneumonia.  Most likely diagnosis is oral  candidiasis. --Patient was started on nystatin 5 ml qid for 7 days with clear instructions on how to use medication. --Patient will follow up with office if symptoms do not resolve    Marjie Skiff, MD Family Medicine Resident PGY-1

## 2016-02-07 NOTE — Assessment & Plan Note (Addendum)
Patient presented with oral pain mostly localized to her tongue. On exam, patient's tongue was coated with a white film consistent with symptoms description. No erythema or exudates noted on exam. Patient has just completed a course of antibiotics azithromycin and levaquin for pneumonia.  Most likely diagnosis is oral candidiasis. --Patient was started on nystatin 5 ml qid for 7 days with clear instructions on how to use medication. --Patient will follow up with office if symptoms do not resolve

## 2016-02-07 NOTE — Patient Instructions (Signed)
I have prescribed nystatin swish and swallow. Please use 4 times a day as discussed in the office for 7 days. Please call office if symptoms do not improve with treatment.

## 2016-02-07 NOTE — Telephone Encounter (Signed)
Left message for patient to call back  

## 2016-02-08 ENCOUNTER — Telehealth: Payer: Self-pay | Admitting: Student

## 2016-02-08 ENCOUNTER — Other Ambulatory Visit: Payer: Self-pay | Admitting: *Deleted

## 2016-02-08 MED ORDER — ALBUTEROL SULFATE HFA 108 (90 BASE) MCG/ACT IN AERS: INHALATION_SPRAY | RESPIRATORY_TRACT | 0 refills | Status: DC

## 2016-02-08 MED ORDER — NYSTATIN 100000 UNIT/ML MT SUSP: 5.0000 mL | Freq: Four times a day (QID) | OROMUCOSAL | 0 refills | Status: DC

## 2016-02-08 NOTE — Telephone Encounter (Signed)
Medication was ordered to by administered in-house.  Medication reordered and sent to patient's pharmacy. Derl Barrow, RN

## 2016-02-08 NOTE — Telephone Encounter (Signed)
Refilled her albuterol. It seems like she is using it excessively. Not clear if she has asthma or COPD. Can she come in for evaluation? Thanks! Bretta Bang

## 2016-02-08 NOTE — Telephone Encounter (Signed)
Pt called and said she was seen yesterday and the dr was supposed to send in a Rx for Nystatin, she said the pharmacy still doesn't have it. She would like someone to let her know when she can pick up her Rx. ep

## 2016-02-10 ENCOUNTER — Ambulatory Visit (INDEPENDENT_AMBULATORY_CARE_PROVIDER_SITE_OTHER): Payer: Medicaid Other | Admitting: Certified Nurse Midwife

## 2016-02-10 ENCOUNTER — Encounter: Payer: Self-pay | Admitting: Certified Nurse Midwife

## 2016-02-10 VITALS — BP 112/79 | HR 118 | Temp 98.9°F | Ht 62.0 in | Wt 170.0 lb

## 2016-02-10 DIAGNOSIS — Z87898 Personal history of other specified conditions: Secondary | ICD-10-CM

## 2016-02-10 DIAGNOSIS — G43719 Chronic migraine without aura, intractable, without status migrainosus: Secondary | ICD-10-CM | POA: Diagnosis not present

## 2016-02-10 DIAGNOSIS — G43909 Migraine, unspecified, not intractable, without status migrainosus: Secondary | ICD-10-CM | POA: Insufficient documentation

## 2016-02-10 DIAGNOSIS — N644 Mastodynia: Secondary | ICD-10-CM | POA: Diagnosis not present

## 2016-02-10 DIAGNOSIS — Z87448 Personal history of other diseases of urinary system: Secondary | ICD-10-CM

## 2016-02-10 MED ORDER — BUTALBITAL-APAP-CAFFEINE 50-325-40 MG PO TABS: 1.0000 | ORAL_TABLET | Freq: Four times a day (QID) | ORAL | 4 refills | Status: DC | PRN

## 2016-02-10 MED ORDER — ELETRIPTAN HYDROBROMIDE 20 MG PO TABS: 20.0000 mg | ORAL_TABLET | ORAL | 0 refills | Status: DC | PRN

## 2016-02-10 NOTE — Progress Notes (Signed)
Patient ID: Michelle [name], female   DOB: August 27, 1990, 25 y.o.   MRN: [id number]  Chief Complaint  Patient presents with  . Migraine    HPI ANNTONETTE [name] is a 25 y.o. female.  Here for f/u on migraine HA.  States that she has had a bad headache for around 24 hours.  Has tried percocet and Dilaudid without success.  Hx of menstrual migraine HA. Encouraged to go to ED if it gets worse.  States that imitrex makes the HA worse.  Has not tried Fioricet because she is out.   Was using benadryl and dexamethasone, but developed pneumonia because she was taking it several times a day.  Also, states that she is having urgency and frequency of urination, negative urine today for UTI.  Dicussed doing Kegel exercises.  Patient verbalized understanding.  Has had a recent bra fitting at [facility].  Discussed that maybe the cause of her breast tenderness, encouraged her to f/u with certified bra fitting at [facility].  Patient verbalized understanding.  Negative right breast exam.  Discussed [place] results from last week: negative [place] except for left ovarian 2cm mass.  Discussed f/u US in several months.  H/O BTL.    HPI  Past Medical History:  Diagnosis Date  . Anxiety   . Asthma   . Bipolar disorder ([place])    no current med.  . Depression    no current med.  . Family history of adverse reaction to anesthesia    states mother and sister are hard to wake up post-op  . History of seizure 06/20/2012   x 1 - during delivery of child(eclampsia)  . Lipoma of lower back 08/2015  . PTSD (post-traumatic stress disorder)     Past Surgical History:  Procedure Laterality Date  . ADENOIDECTOMY, TONSILLECTOMY AND MYRINGOTOMY WITH TUBE PLACEMENT  1990's  . CESAREAN SECTION  06/20/2012   Procedure: CESAREAN SECTION;  Surgeon: Emily Filbert, MD;  Location: [facility];  Service: Obstetrics;  Laterality: N/A;  . LAPAROSCOPIC APPENDECTOMY  07/25/2012   Procedure: APPENDECTOMY LAPAROSCOPIC;  Surgeon: Zenovia Jarred, MD;  Location:  [place];  Service: General;  Laterality: N/A;  . LAPAROSCOPIC TUBAL LIGATION Bilateral 08/04/2014   Procedure: BILATERAL LAPAROSCOPIC TUBAL LIGATION;  Surgeon: Woodroe Mode, MD;  Location: [facility];  Service: Gynecology;  Laterality: Bilateral;  . LIPOMA EXCISION N/A 09/16/2015   Procedure: EXCISION LIPOMA LUMBAR REGION;  Surgeon: Donnie Mesa, MD;  Location: [facility];  Service: General;  Laterality: N/A;  . WISDOM TOOTH EXTRACTION      Family History  Problem Relation Age of Onset  . Breast cancer Paternal Grandmother   . Colon cancer Paternal Grandfather   . Colon cancer Maternal Grandfather   . Colon polyps Maternal Aunt   . Diabetes Mother   . Anesthesia problems Mother     hard to wake up post-op  . Diabetes Sister   . Anesthesia problems Sister     hard to wake up post-op  . Diabetes Father   . Heart disease Father     Social History Social History  Substance Use Topics  . Smoking status: Current Every Day Smoker    Packs/day: 0.50    Years: 14.00    Types: Cigarettes  . Smokeless tobacco: Never Used     Comment: less than 1 pack/day  . Alcohol use No    Allergies  Allergen Reactions  . Codeine Hives and Shortness Of Breath  Current Outpatient Prescriptions  Medication Sig Dispense Refill  . albuterol (VENTOLIN HFA) 108 (90 Base) MCG/ACT inhaler INHALE 2 PUFFS BY MOUTH EVERY 4 HOURS AS NEEDED FOR WHEEZING OR SHORTNESS OF BREATH 18 g 0  . ibuprofen (ADVIL,MOTRIN) 800 MG tablet Take 1 tablet (800 mg total) by mouth every 8 (eight) hours as needed. 30 tablet 5  . medroxyPROGESTERone (PROVERA) 10 MG tablet Take 1 tablet (10 mg total) by mouth daily. 30 tablet 0  . nystatin (MYCOSTATIN) 100000 UNIT/ML suspension Take 5 mLs (500,000 Units total) by mouth 4 (four) times daily. 60 mL 0  . ondansetron (ZOFRAN) 4 MG tablet Take 1 tablet (4 mg total) by mouth every 8 (eight) hours as needed for nausea or vomiting. 30 tablet 1  . oxyCODONE-acetaminophen  (PERCOCET) 10-325 MG tablet Take 1 tablet by mouth every 4 (four) hours as needed for pain. 40 tablet 0  . pantoprazole (PROTONIX) 40 MG tablet Take 2 tablets (80 mg total) by mouth daily. 120 tablet 0  . Prenatal Vit-Fe Phos-FA-Omega (VITAFOL GUMMIES) 3.33-0.333-34.8 MG CHEW Chew 3 tablets by mouth daily. 90 tablet PRN  . simethicone (MYLICON) 0000000 MG chewable tablet Chew 125 mg by mouth every 6 (six) hours as needed for flatulence. Reported on 01/31/2016    . butalbital-acetaminophen-caffeine (FIORICET) 50-325-40 MG tablet Take 1-2 tablets by mouth every 6 (six) hours as needed for headache. 45 tablet 4  . eletriptan (RELPAX) 20 MG tablet Take 1 tablet (20 mg total) by mouth as needed for migraine or headache. May repeat in 2 hours if headache persists or recurs. 10 tablet 0  . tranexamic acid (LYSTEDA) 650 MG TABS tablet Take 2 tablets (1,300 mg total) by mouth 3 (three) times daily. (Patient not taking: Reported on 02/10/2016) 30 tablet 4   No current facility-administered medications for this visit.     Review of Systems Review of Systems Constitutional: negative for fatigue and weight loss, +migraine Respiratory: negative for cough and wheezing Cardiovascular: negative for chest pain, fatigue and palpitations Gastrointestinal: negative for abdominal pain and change in bowel habits Genitourinary: + urinary frequency Integument/breast: negative for nipple discharge, + right breast myalgia Musculoskeletal:negative for myalgias Neurological: negative for gait problems and tremors Behavioral/Psych: negative for abusive relationship, depression Endocrine: negative for temperature intolerance     Blood pressure 112/79, pulse (!) 118, temperature 98.9 F (37.2 C), temperature source Oral, height 5\' 2"  (1.575 m), weight 170 lb (77.1 kg), last menstrual period 01/26/2016.  Physical Exam Physical Exam General:   alert  Skin:   no rash or abnormalities  Lungs:   clear to auscultation  bilaterally  Heart:   regular rate and rhythm, S1, S2 normal, no murmur, click, rub or gallop  Breasts:   normal without suspicious masses, skin or nipple changes or axillary nodes  Abdomen:  normal findings: no organomegaly, soft, non-tender and no hernia  Pelvis:  deferred    50% of 20 min visit spent on counseling and coordination of care.   Data Reviewed Previous medical hx, meds, [place] results  Assessment     AUB, dysmenorrhea Migraines Right breast myalgia    Plan   F/U with Dr. Jodi Mourning for colposcopy 02/14/16.  Awaiting endometrial ablation.    Orders Placed This Encounter  Procedures  . Ambulatory referral to Neurology    Referral Priority:   Routine    Referral Type:   Consultation    Referral Reason:   Specialty Services Required    Requested Specialty:   Neurology  Number of Visits Requested:   1   Meds ordered this encounter  Medications  . butalbital-acetaminophen-caffeine (FIORICET) 50-325-40 MG tablet    Sig: Take 1-2 tablets by mouth every 6 (six) hours as needed for headache.    Dispense:  45 tablet    Refill:  4  . eletriptan (RELPAX) 20 MG tablet    Sig: Take 1 tablet (20 mg total) by mouth as needed for migraine or headache. May repeat in 2 hours if headache persists or recurs.    Dispense:  10 tablet    Refill:  0     Possible management options include: awaiting neurology consult, urology consult Follow up as needed.

## 2016-02-14 ENCOUNTER — Other Ambulatory Visit: Payer: Self-pay | Admitting: Obstetrics

## 2016-02-14 ENCOUNTER — Encounter: Payer: Self-pay | Admitting: Obstetrics

## 2016-02-14 ENCOUNTER — Ambulatory Visit (INDEPENDENT_AMBULATORY_CARE_PROVIDER_SITE_OTHER): Payer: Medicaid Other | Admitting: Obstetrics

## 2016-02-14 ENCOUNTER — Other Ambulatory Visit: Payer: Self-pay

## 2016-02-14 DIAGNOSIS — N949 Unspecified condition associated with female genital organs and menstrual cycle: Secondary | ICD-10-CM

## 2016-02-14 DIAGNOSIS — R102 Pelvic and perineal pain: Secondary | ICD-10-CM

## 2016-02-14 DIAGNOSIS — Z3202 Encounter for pregnancy test, result negative: Secondary | ICD-10-CM

## 2016-02-14 DIAGNOSIS — N946 Dysmenorrhea, unspecified: Secondary | ICD-10-CM | POA: Diagnosis not present

## 2016-02-14 DIAGNOSIS — R896 Abnormal cytological findings in specimens from other organs, systems and tissues: Secondary | ICD-10-CM

## 2016-02-14 DIAGNOSIS — G8929 Other chronic pain: Secondary | ICD-10-CM | POA: Diagnosis not present

## 2016-02-14 DIAGNOSIS — IMO0002 Reserved for concepts with insufficient information to code with codable children: Secondary | ICD-10-CM

## 2016-02-14 LAB — POCT URINE PREGNANCY: Preg Test, Ur: NEGATIVE

## 2016-02-14 MED ORDER — OXYCODONE-ACETAMINOPHEN 10-325 MG PO TABS: 1.0000 | ORAL_TABLET | Freq: Four times a day (QID) | ORAL | 0 refills | Status: DC | PRN

## 2016-02-14 NOTE — Progress Notes (Signed)
Colposcopy Procedure Note  Indications: Pap smear 1 months ago showed: low-grade squamous intraepithelial neoplasia (LGSIL - encompassing HPV,mild dysplasia,CIN I). The prior pap showed no abnormalities.  Prior cervical/vaginal disease: normal exam without visible pathology. Prior cervical treatment: no treatment.  Procedure Details  The risks and benefits of the procedure and Written informed consent obtained.  A time-out was performed confirming the patient, procedure and allergy status  Speculum placed in vagina and excellent visualization of cervix achieved, cervix swabbed x 3 with acetic acid solution.  Findings: Cervix: no visible lesions, no mosaicism, no punctation and no abnormal vasculature; SCJ visualized 360 degrees without lesions, endocervical curettage performed, cervical biopsies taken at 6 and 12 o'clock, specimen labelled and sent to pathology and hemostasis achieved with silver nitrate.   Vaginal inspection: normal without visible lesions. Vulvar colposcopy: vulvar colposcopy not performed.   Physical Exam   Specimens: ECC and Cervical Biopsies  Complications: none.  Plan: Specimens labelled and sent to Pathology. Will base further treatment on Pathology findings. Treatment options discussed with patient. Post biopsy instructions given to patient. Return to discuss Pathology results in 2 weeks.

## 2016-02-17 LAB — NUSWAB VG+, CANDIDA 6SP
Candida albicans, NAA: POSITIVE — AB
Candida glabrata, NAA: NEGATIVE
Candida krusei, NAA: NEGATIVE
Candida lusitaniae, NAA: NEGATIVE
Candida parapsilosis, NAA: NEGATIVE
Candida tropicalis, NAA: NEGATIVE
Chlamydia trachomatis, NAA: NEGATIVE
Neisseria gonorrhoeae, NAA: NEGATIVE
Trich vag by NAA: NEGATIVE

## 2016-02-18 ENCOUNTER — Other Ambulatory Visit: Payer: Self-pay | Admitting: Obstetrics

## 2016-02-18 DIAGNOSIS — B373 Candidiasis of vulva and vagina: Secondary | ICD-10-CM

## 2016-02-18 DIAGNOSIS — B3731 Acute candidiasis of vulva and vagina: Secondary | ICD-10-CM

## 2016-02-18 MED ORDER — FLUCONAZOLE 150 MG PO TABS: 150.0000 mg | ORAL_TABLET | Freq: Once | ORAL | 2 refills | Status: AC

## 2016-02-21 ENCOUNTER — Telehealth: Payer: Self-pay | Admitting: *Deleted

## 2016-02-21 NOTE — Telephone Encounter (Signed)
Patient was given an Rx for migraine but she is having trouble getting it filled. Told patient that it may need a prior authorization and I will check with Vinnie Level. Patient has failed on Imitrex it made her headache worse.

## 2016-02-24 ENCOUNTER — Encounter (HOSPITAL_COMMUNITY): Payer: Self-pay

## 2016-02-24 ENCOUNTER — Emergency Department (HOSPITAL_COMMUNITY)
Admission: EM | Admit: 2016-02-24 | Discharge: 2016-02-25 | Disposition: A | Discharge status: Home / Self Care | Payer: Medicaid Other | Attending: Emergency Medicine | Admitting: Emergency Medicine

## 2016-02-24 DIAGNOSIS — G43909 Migraine, unspecified, not intractable, without status migrainosus: Secondary | ICD-10-CM | POA: Diagnosis present

## 2016-02-24 DIAGNOSIS — F17210 Nicotine dependence, cigarettes, uncomplicated: Secondary | ICD-10-CM | POA: Insufficient documentation

## 2016-02-24 DIAGNOSIS — J45909 Unspecified asthma, uncomplicated: Secondary | ICD-10-CM | POA: Insufficient documentation

## 2016-02-24 DIAGNOSIS — G43829 Menstrual migraine, not intractable, without status migrainosus: Secondary | ICD-10-CM | POA: Insufficient documentation

## 2016-02-24 DIAGNOSIS — Z79899 Other long term (current) drug therapy: Secondary | ICD-10-CM | POA: Diagnosis not present

## 2016-02-24 DIAGNOSIS — R102 Pelvic and perineal pain: Secondary | ICD-10-CM | POA: Diagnosis not present

## 2016-02-24 HISTORY — DX: Migraine, unspecified, not intractable, without status migrainosus: G43.909

## 2016-02-24 MED ORDER — DEXAMETHASONE SODIUM PHOSPHATE 10 MG/ML IJ SOLN
10.0000 mg | Freq: Once | INTRAMUSCULAR | Status: AC
  Administered 2016-02-24: 10 mg via INTRAVENOUS
  Filled 2016-02-24: qty 1

## 2016-02-24 MED ORDER — METOCLOPRAMIDE HCL 5 MG/ML IJ SOLN
10.0000 mg | Freq: Once | INTRAMUSCULAR | Status: AC
  Administered 2016-02-24: 10 mg via INTRAVENOUS
  Filled 2016-02-24: qty 2

## 2016-02-24 MED ORDER — DIPHENHYDRAMINE HCL 50 MG/ML IJ SOLN
12.5000 mg | Freq: Once | INTRAMUSCULAR | Status: AC
  Administered 2016-02-24: 12.5 mg via INTRAVENOUS
  Filled 2016-02-24: qty 1

## 2016-02-24 NOTE — Discharge Instructions (Signed)
KEEP YOUR SCHEDULED APPOINTMENT WITH NEUROLOGY FOR FURTHER MANAGEMENT OF HEADACHES.

## 2016-02-24 NOTE — ED Provider Notes (Signed)
[facility] DEPT Provider Note   CSN: [id number] Arrival date & time: 02/24/16  1733  First Provider Contact:  First MD Initiated Contact with Patient 02/24/16 2148        History   Chief Complaint Chief Complaint  Patient presents with  . Migraine    HPI Michelle [name] is a 25 y.o. female.  H/o menstrual migraines, recent dx endometriosis. Migraine that started this morning. Ibuprofen and Fioricet without relief. No photophobia or phonophobia. No fever. Current headache is consistent with history of headaches.    The history is provided by the patient. No language interpreter was used.    Past Medical History:  Diagnosis Date  . Anxiety   . Asthma   . Bipolar disorder ([place])    no current med.  . Depression    no current med.  . Family history of adverse reaction to anesthesia    states mother and sister are hard to wake up post-op  . History of seizure 06/20/2012   x 1 - during delivery of child(eclampsia)  . Lipoma of lower back 08/2015  . Migraine   . PTSD (post-traumatic stress disorder)     Patient Active Problem List   Diagnosis Date Noted  . Migraine 02/10/2016  . Thrush, oral 02/07/2016  . Chest pain on breathing 01/24/2016  . Abnormal uterine bleeding (AUB) 01/13/2016  . Dysmenorrhea 01/13/2016  . H/O tubal ligation 01/13/2016  . Malaise 10/19/2015  . Lipoma of back 08/23/2015  . Gingivitis 08/19/2015  . Encounter for smoking cessation counseling 08/19/2015  . Paronychia 11/10/2014  . Neck pain 10/08/2014  . Benign paroxysmal positional vertigo 02/14/2013  . 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 Visual field defect 06/23/2012  . Menorrhagia 10/28/2010  . PTSD 03/02/2010  . Tobacco use disorder 02/07/2008  . PAP SMEAR, LGSIL, ABNORMAL 11/27/2006  . 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 ADENOIDECTOMY, TONSILLECTOMY AND MYRINGOTOMY WITH TUBE PLACEMENT  1990's  . CESAREAN SECTION   06/20/2012   Procedure: CESAREAN SECTION;  Surgeon: Emily Filbert, MD;  Location: [facility];  Service: Obstetrics;  Laterality: N/A;  . LAPAROSCOPIC APPENDECTOMY  07/25/2012   Procedure: APPENDECTOMY LAPAROSCOPIC;  Surgeon: Zenovia Jarred, MD;  Location: [facility];  Service: General;  Laterality: N/A;  . LAPAROSCOPIC TUBAL LIGATION Bilateral 08/04/2014   Procedure: BILATERAL LAPAROSCOPIC TUBAL LIGATION;  Surgeon: Woodroe Mode, MD;  Location: [facility];  Service: Gynecology;  Laterality: Bilateral;  . LIPOMA EXCISION N/A 09/16/2015   Procedure: EXCISION LIPOMA LUMBAR REGION;  Surgeon: Donnie Mesa, MD;  Location: [facility];  Service: General;  Laterality: N/A;  . WISDOM TOOTH EXTRACTION      OB History    Gravida Para Term Preterm AB Living   3 3 2 1  0 3   SAB TAB Ectopic Multiple Live Births   0 0 0 0 1      Obstetric Comments   C-Section Indication: Non-reassuring fetal tracing, growth delay & marked proteinuria & Pre-Eclampsia Eclamptic Seizure during C-section delivery       Home Medications    Prior to Admission medications   Medication Sig Start Date End Date Taking? Authorizing Provider  albuterol (VENTOLIN HFA) 108 (90 Base) MCG/ACT inhaler INHALE 2 PUFFS BY MOUTH EVERY 4 HOURS AS NEEDED FOR WHEEZING OR SHORTNESS OF BREATH 02/08/16   Mercy Riding, MD  butalbital-acetaminophen-caffeine (FIORICET) 50-325-40 MG tablet Take 1-2 tablets by mouth every  6 (six) hours as needed for headache. 02/10/16   Morene Crocker, CNM  eletriptan (RELPAX) 20 MG tablet Take 1 tablet (20 mg total) by mouth as needed for migraine or headache. May repeat in 2 hours if headache persists or recurs. 02/10/16   Rachelle A Denney, CNM  ibuprofen (ADVIL,MOTRIN) 800 MG tablet Take 1 tablet (800 mg total) by mouth every 8 (eight) hours as needed. 01/31/16   Shelly Bombard, MD  medroxyPROGESTERone (PROVERA) 10 MG tablet Take 1 tablet (10 mg total) by mouth daily. 01/31/16   Shelly Bombard, MD  nystatin  (MYCOSTATIN) 100000 UNIT/ML suspension Take 5 mLs (500,000 Units total) by mouth 4 (four) times daily. Patient not taking: Reported on 02/14/2016 02/08/16   Marjie Skiff, MD  ondansetron (ZOFRAN) 4 MG tablet Take 1 tablet (4 mg total) by mouth every 8 (eight) hours as needed for nausea or vomiting. 01/13/16   Morene Crocker, CNM  oxyCODONE-acetaminophen (PERCOCET) 10-325 MG tablet Take 1 tablet by mouth every 6 (six) hours as needed for pain. 02/14/16   Shelly Bombard, MD  pantoprazole (PROTONIX) 40 MG tablet Take 2 tablets (80 mg total) by mouth daily. 01/24/16   Elberta Leatherwood, MD  Prenatal Vit-Fe Phos-FA-Omega (VITAFOL GUMMIES) 3.33-0.333-34.8 MG CHEW Chew 3 tablets by mouth daily. 01/13/16   Rachelle A Denney, CNM  simethicone (MYLICON) 0000000 MG chewable tablet Chew 125 mg by mouth every 6 (six) hours as needed for flatulence. Reported on 01/31/2016    Historical Provider, MD  tranexamic acid (LYSTEDA) 650 MG TABS tablet Take 2 tablets (1,300 mg total) by mouth 3 (three) times daily. Patient not taking: Reported on 02/10/2016 01/13/16   Morene Crocker, CNM    Family History Family History  Problem Relation Age of Onset  . Breast cancer Paternal Grandmother   . Colon cancer Paternal Grandfather   . Colon cancer Maternal Grandfather   . Colon polyps Maternal Aunt   . Diabetes Mother   . Anesthesia problems Mother     hard to wake up post-op  . Diabetes Sister   . Anesthesia problems Sister     hard to wake up post-op  . Diabetes Father   . Heart disease Father     Social History Social History  Substance Use Topics  . Smoking status: Current Every Day Smoker    Packs/day: 0.50    Years: 14.00    Types: Cigarettes  . Smokeless tobacco: Never Used     Comment: less than 1 pack/day  . Alcohol use No     Allergies   Codeine   Review of Systems Review of Systems  Constitutional: Negative for chills and fever.  HENT: Negative for congestion and sore throat.   Respiratory:  Negative.   Cardiovascular: Negative.   Gastrointestinal: Positive for nausea. Negative for vomiting.  Musculoskeletal: Negative.   Skin: Negative.   Neurological: Positive for headaches.     Physical Exam Updated Vital Signs BP 114/74 (BP Location: Left Arm)   Pulse 103   Temp 98.8 F (37.1 C) (Oral)   Resp 14   LMP 01/26/2016   SpO2 100%   Physical Exam  Constitutional: She is oriented to person, place, and time. She appears well-developed and well-nourished.  HENT:  Head: Normocephalic.  Eyes: Pupils are equal, round, and reactive to light.  Neck: Normal range of motion. Neck supple.  Cardiovascular: Normal rate and regular rhythm.   Pulmonary/Chest: Effort normal and breath sounds normal.  Abdominal: Soft.  Bowel sounds are normal. There is no tenderness. There is no rebound and no guarding.  Musculoskeletal: Normal range of motion.  Neurological: She is alert and oriented to person, place, and time. She has normal strength and normal reflexes. No sensory deficit. She displays a negative Romberg sign. Coordination normal. GCS eye subscore is 4. GCS verbal subscore is 5. GCS motor subscore is 6.  CN's 3-12 grossly intact. No deficits of coordination. Speech clear and focused. No facial asymmetry.  Skin: Skin is warm and dry. No rash noted.  Psychiatric: She has a normal mood and affect.     ED Treatments / Results  Labs (all labs ordered are listed, but only abnormal results are displayed) Labs Reviewed - No data to display  EKG  EKG Interpretation None       Radiology No results found.  Procedures Procedures (including critical care time)  Medications Ordered in ED Medications - No data to display   Initial Impression / Assessment and Plan / ED Course  I have reviewed the triage vital signs and the nursing notes.  Pertinent labs & imaging results that were available during my care of the patient were reviewed by me and considered in my medical decision  making (see chart for details).  Clinical Course    Patient with headache typical of history of migraines. Neurologic exam nonfocal. Headache is resolved with headache cocktail. VSS. She can be discharged home.  Final Clinical Impressions(s) / ED Diagnoses   Final diagnoses:  None  1. Migraine   New Prescriptions New Prescriptions   No medications on file     Charlann Lange, Hershal Coria 02/24/16 [place], MD 02/25/16 [phone]

## 2016-02-24 NOTE — ED Triage Notes (Signed)
Patient here with migraine since am, reports pain occipital area, alert and oriented with nausea. Took Fioricet and ibuprofen with no relief.

## 2016-02-24 NOTE — ED Notes (Signed)
Pt stable, ambulatory, states understanding of discharge instructions 

## 2016-02-25 ENCOUNTER — Encounter (HOSPITAL_COMMUNITY): Payer: Self-pay

## 2016-02-25 ENCOUNTER — Encounter (HOSPITAL_COMMUNITY): Payer: Self-pay | Admitting: *Deleted

## 2016-02-25 ENCOUNTER — Inpatient Hospital Stay (EMERGENCY_DEPARTMENT_HOSPITAL)
Admission: AD | Admit: 2016-02-25 | Discharge: 2016-02-25 | Disposition: A | Discharge status: Home / Self Care | Payer: Medicaid Other | Source: Ambulatory Visit | Attending: Family Medicine | Admitting: Family Medicine

## 2016-02-25 ENCOUNTER — Ambulatory Visit (HOSPITAL_COMMUNITY)
Admission: EM | Admit: 2016-02-25 | Discharge: 2016-02-25 | Disposition: A | Discharge status: Nursing Home (Includes ICF) | Payer: Medicaid Other | Attending: Family Medicine | Admitting: Family Medicine

## 2016-02-25 DIAGNOSIS — F17210 Nicotine dependence, cigarettes, uncomplicated: Secondary | ICD-10-CM | POA: Insufficient documentation

## 2016-02-25 DIAGNOSIS — F319 Bipolar disorder, unspecified: Secondary | ICD-10-CM | POA: Insufficient documentation

## 2016-02-25 DIAGNOSIS — G43001 Migraine without aura, not intractable, with status migrainosus: Secondary | ICD-10-CM

## 2016-02-25 DIAGNOSIS — G8929 Other chronic pain: Secondary | ICD-10-CM

## 2016-02-25 DIAGNOSIS — J45909 Unspecified asthma, uncomplicated: Secondary | ICD-10-CM

## 2016-02-25 DIAGNOSIS — G43909 Migraine, unspecified, not intractable, without status migrainosus: Secondary | ICD-10-CM | POA: Insufficient documentation

## 2016-02-25 DIAGNOSIS — R102 Pelvic and perineal pain: Secondary | ICD-10-CM

## 2016-02-25 DIAGNOSIS — F419 Anxiety disorder, unspecified: Secondary | ICD-10-CM | POA: Insufficient documentation

## 2016-02-25 DIAGNOSIS — F4310 Post-traumatic stress disorder, unspecified: Secondary | ICD-10-CM | POA: Insufficient documentation

## 2016-02-25 DIAGNOSIS — K5900 Constipation, unspecified: Secondary | ICD-10-CM | POA: Insufficient documentation

## 2016-02-25 HISTORY — DX: Dislocation of jaw, unspecified side, initial encounter: S03.00XA

## 2016-02-25 HISTORY — DX: Dizziness and giddiness: R42

## 2016-02-25 LAB — URINALYSIS, ROUTINE W REFLEX MICROSCOPIC
Bilirubin Urine: NEGATIVE
Glucose, UA: NEGATIVE mg/dL
Ketones, ur: NEGATIVE mg/dL
Leukocytes, UA: NEGATIVE
Nitrite: NEGATIVE
Protein, ur: NEGATIVE mg/dL
Specific Gravity, Urine: <1.005 — ABNORMAL LOW (ref 1.005–1.030)
pH: 5.5 (ref 5.0–8.0)

## 2016-02-25 LAB — URINE MICROSCOPIC-ADD ON
Bacteria, UA: NONE SEEN
RBC / HPF: NONE SEEN RBC/hpf (ref 0–5)

## 2016-02-25 LAB — POCT PREGNANCY, URINE: Preg Test, Ur: NEGATIVE

## 2016-02-25 MED ORDER — OXYCODONE-ACETAMINOPHEN 7.5-325 MG PO TABS
1.0000 | ORAL_TABLET | Freq: Once | ORAL | Status: AC
  Administered 2016-02-25: 1 via ORAL
  Filled 2016-02-25: qty 1

## 2016-02-25 MED ORDER — METOCLOPRAMIDE HCL 10 MG PO TABS
10.0000 mg | ORAL_TABLET | Freq: Once | ORAL | Status: AC
  Administered 2016-02-25: 10 mg via ORAL
  Filled 2016-02-25: qty 1

## 2016-02-25 MED ORDER — METOCLOPRAMIDE HCL 10 MG PO TABS: 10.0000 mg | ORAL_TABLET | Freq: Four times a day (QID) | ORAL | 1 refills | Status: DC | PRN

## 2016-02-25 MED ORDER — KETOROLAC TROMETHAMINE 60 MG/2ML IM SOLN
60.0000 mg | Freq: Once | INTRAMUSCULAR | Status: AC
  Administered 2016-02-25: 60 mg via INTRAMUSCULAR
  Filled 2016-02-25: qty 2

## 2016-02-25 NOTE — MAU Note (Signed)
Patient was seen at [facility] today not evaluated just sent over to MAU for further evaluation. Patient c/o stabbing pinching vaginal pain since last night, no pain with urinating, no vaginal discharge.  LMP 01/26/16

## 2016-02-25 NOTE — Discharge Instructions (Signed)
YOU SHOULD GO TO Michelle [name] FOR FURTHER EVALUATION.

## 2016-02-25 NOTE — MAU Provider Note (Signed)
Chief Complaint:  Vaginal Pain   First Provider Initiated Contact with Patient 02/25/16 1725     HPI Michelle [name] is a 25 y.o. [id number] who presents to maternity admissions reporting pinching vaginal pain.  Started last night, but has had it off and on for a long time.  Has multiple Rxes for Oxycodone in system, 2 Rxes #40 tabs in past month.    Denies dysuria. She reports no vaginal bleeding, vaginal itching/burning, urinary symptoms, h/a, dizziness, n/v, or fever/chills.   Also has daily headaches, states they start as regular headaches "then turn into migraines".  Takes Fioricet for them.  Has had relief from migraine cocktail before in ED.   Has an appointment with urogynecologist in September "because Dr Jodi Mourning doesn't do laparoscopy anymore".  RN Note: Patient was seen at [facility] today not evaluated just sent over to MAU for further evaluation. Patient c/o stabbing pinching vaginal pain since last night, no pain with urinating, no vaginal discharge.  LMP 01/26/16  Past Medical History: Past Medical History:  Diagnosis Date  . Anxiety   . Asthma   . Bipolar disorder ([place])    no current med.  . Depression    no current med.  . Family history of adverse reaction to anesthesia    states mother and sister are hard to wake up post-op  . History of seizure 06/20/2012   x 1 - during delivery of child(eclampsia)  . Lipoma of lower back 08/2015  . Migraine   . PTSD (post-traumatic stress disorder)     Past obstetric history: OB History  Gravida Para Term Preterm AB Living  3 3 2 1  0 3  SAB TAB Ectopic Multiple Live Births  0 0 0 0 1    # Outcome Date GA Lbr Len/2nd Weight Sex Delivery Anes PTL Lv  3 Preterm 06/20/12 [redacted]w[redacted]d  3 lb 6.2 oz (1.537 kg) M CS-LTranv Spinal  LIV  2 Term 12/26/07 [redacted]w[redacted]d  7 lb (3.175 kg)  Vag-Spont        Birth Comments: Gestational Diabetes @ ~18 weeks; Followed at High Risk  1 Term 08/08/06 [redacted]w[redacted]d  6 lb 7 oz (2.92 kg)  Vag-Spont        Birth Comments:  Gestational Diabetes @ 17 weeks; followed at High Risk    Obstetric Comments  C-Section Indication: Non-reassuring fetal tracing, growth delay & marked proteinuria & Pre-Eclampsia  Eclamptic Seizure during C-section delivery    Past Surgical History: Past Surgical History:  Procedure Laterality Date  . ADENOIDECTOMY, TONSILLECTOMY AND MYRINGOTOMY WITH TUBE PLACEMENT  1990's  . CESAREAN SECTION  06/20/2012   Procedure: CESAREAN SECTION;  Surgeon: Emily Filbert, MD;  Location: [facility];  Service: Obstetrics;  Laterality: N/A;  . LAPAROSCOPIC APPENDECTOMY  07/25/2012   Procedure: APPENDECTOMY LAPAROSCOPIC;  Surgeon: Zenovia Jarred, MD;  Location: [facility];  Service: General;  Laterality: N/A;  . LAPAROSCOPIC TUBAL LIGATION Bilateral 08/04/2014   Procedure: BILATERAL LAPAROSCOPIC TUBAL LIGATION;  Surgeon: Woodroe Mode, MD;  Location: [facility];  Service: Gynecology;  Laterality: Bilateral;  . LIPOMA EXCISION N/A 09/16/2015   Procedure: EXCISION LIPOMA LUMBAR REGION;  Surgeon: Donnie Mesa, MD;  Location: [facility];  Service: General;  Laterality: N/A;  . WISDOM TOOTH EXTRACTION      Family History: Family History  Problem Relation Age of Onset  . Breast cancer Paternal Grandmother   . Colon cancer Paternal Grandfather   . Colon cancer Maternal Grandfather   .  Colon polyps Maternal Aunt   . Diabetes Mother   . Anesthesia problems Mother     hard to wake up post-op  . Diabetes Sister   . Anesthesia problems Sister     hard to wake up post-op  . Diabetes Father   . Heart disease Father     Social History: Social History  Substance Use Topics  . Smoking status: Current Every Day Smoker    Packs/day: 0.50    Years: 14.00    Types: Cigarettes  . Smokeless tobacco: Never Used     Comment: less than 1 pack/day  . Alcohol use No    Allergies:  Allergies  Allergen Reactions  . Codeine Hives and Shortness Of Breath    Meds:  Prescriptions Prior to Admission   Medication Sig Dispense Refill Last Dose  . albuterol (VENTOLIN HFA) 108 (90 Base) MCG/ACT inhaler INHALE 2 PUFFS BY MOUTH EVERY 4 HOURS AS NEEDED FOR WHEEZING OR SHORTNESS OF BREATH 18 g 0 Past Week at Unknown time  . butalbital-acetaminophen-caffeine (FIORICET) 50-325-40 MG tablet Take 1-2 tablets by mouth every 6 (six) hours as needed for headache. 45 tablet 4 02/25/2016 at Unknown time  . eletriptan (RELPAX) 20 MG tablet Take 1 tablet (20 mg total) by mouth as needed for migraine or headache. May repeat in 2 hours if headache persists or recurs. 10 tablet 0 Unknown at Unknown time  . ibuprofen (ADVIL,MOTRIN) 800 MG tablet Take 1 tablet (800 mg total) by mouth every 8 (eight) hours as needed. 30 tablet 5 02/25/2016 at Unknown time  . medroxyPROGESTERone (PROVERA) 10 MG tablet Take 1 tablet (10 mg total) by mouth daily. 30 tablet 0 02/24/2016 at Unknown time  . nystatin (MYCOSTATIN) 100000 UNIT/ML suspension Take 5 mLs (500,000 Units total) by mouth 4 (four) times daily. (Patient not taking: Reported on 02/14/2016) 60 mL 0 Unknown at Unknown time  . ondansetron (ZOFRAN) 4 MG tablet Take 1 tablet (4 mg total) by mouth every 8 (eight) hours as needed for nausea or vomiting. 30 tablet 1 Unknown at Unknown time  . oxyCODONE-acetaminophen (PERCOCET) 10-325 MG tablet Take 1 tablet by mouth every 6 (six) hours as needed for pain. 40 tablet 0 02/25/2016 at Unknown time  . pantoprazole (PROTONIX) 40 MG tablet Take 2 tablets (80 mg total) by mouth daily. 120 tablet 0 02/24/2016 at Unknown time  . Prenatal Vit-Fe Phos-FA-Omega (VITAFOL GUMMIES) 3.33-0.333-34.8 MG CHEW Chew 3 tablets by mouth daily. 90 tablet PRN 02/25/2016 at Unknown time  . simethicone (MYLICON) 0000000 MG chewable tablet Chew 125 mg by mouth every 6 (six) hours as needed for flatulence. Reported on 01/31/2016   Unknown at Unknown time  . tranexamic acid (LYSTEDA) 650 MG TABS tablet Take 2 tablets (1,300 mg total) by mouth 3 (three) times daily. (Patient  not taking: Reported on 02/10/2016) 30 tablet 4 Unknown at Unknown time    I have reviewed patient's Past Medical Hx, Surgical Hx, Family Hx, Social Hx, medications and allergies.  ROS:  Review of Systems  Constitutional: Negative for chills and fever.  Respiratory: Negative for shortness of breath.   Gastrointestinal: Negative for abdominal pain, constipation, diarrhea, nausea and vomiting.  Genitourinary: Positive for vaginal pain. Negative for dysuria, frequency, pelvic pain and vaginal discharge.  Neurological: Negative for dizziness.   Other systems negative  Physical Exam  Patient Vitals for the past 24 hrs:  BP Temp Temp src Pulse Height Weight  02/25/16 1657 124/92 - - - - -  02/25/16  1656 - 98.3 F (36.8 C) Oral (!) 129 5\' 2"  (1.575 m) 170 lb (77.1 kg)   Constitutional: Well-developed, well-nourished female in no acute distress.  Cardiovascular: normal rate and rhythm, no ectopy audible, S1 & S2 heard, no murmur Respiratory: normal effort, no distress. Lungs CTAB with no wheezes or crackles GI: Abd soft, non-tender.  Nondistended.  No rebound, No guarding.  Bowel Sounds audible  MS: Extremities nontender, no edema, normal ROM Neurologic: Alert and oriented x 4.   Grossly nonfocal. GU: Neg CVAT. Skin:  Warm and Dry Psych:  Affect appropriate.  PELVIC EXAM: Cervix pink, visually closed, without lesion, scant white creamy discharge, vaginal walls and external genitalia normal Bimanual exam: Cervix firm, anterior, neg CMT, uterus nontender, nonenlarged, There is point tenderness in left vaginal wall, but no mass or abnormality is appreciated.  No cervical motion tenderness. Declines STD cultures   Labs:    Results for orders placed or performed during the hospital encounter of 02/25/16 (from the past 72 hour(s))  Urinalysis, Routine w reflex microscopic (not at [facility])     Status: Abnormal   Collection Time: 02/25/16  5:30 PM  Result Value Ref Range   Color, Urine STRAW (A)  YELLOW   APPearance CLEAR CLEAR   Specific Gravity, Urine <1.005 (L) 1.005 - 1.030   pH 5.5 5.0 - 8.0   Glucose, UA NEGATIVE NEGATIVE mg/dL   Hgb urine dipstick MODERATE (A) NEGATIVE   Bilirubin Urine NEGATIVE NEGATIVE   Ketones, ur NEGATIVE NEGATIVE mg/dL   Protein, ur NEGATIVE NEGATIVE mg/dL   Nitrite NEGATIVE NEGATIVE   Leukocytes, UA NEGATIVE NEGATIVE  Urine microscopic-add on     Status: Abnormal   Collection Time: 02/25/16  5:30 PM  Result Value Ref Range   Squamous Epithelial / LPF 0-5 (A) NONE SEEN   WBC, UA 0-5 0 - 5 WBC/hpf   RBC / HPF NONE SEEN 0 - 5 RBC/hpf   Bacteria, UA NONE SEEN NONE SEEN  Pregnancy, urine POC     Status: None   Collection Time: 02/25/16  5:55 PM  Result Value Ref Range   Preg Test, Ur NEGATIVE NEGATIVE    Comment:        THE SENSITIVITY OF THIS METHODOLOGY IS >24 mIU/mL      Imaging:  No results found.  MAU Course/MDM: I have ordered labs as follows:  See above Imaging ordered: none Results reviewed.   Husband states she needs a shot of Dilaudid.  I stated I don't think that is necessary now. Gave her Toradol with "no relief".  Gave one tablet of Percocet 7.5/325 with good relief Gave her dose of Reglan alone with excellent relief of headache.  Discussed long term use of opiods.  Discussed they are easily addictive in short amount of time. Patient states she plans to used them judiciously .  I will not give her a new Rx tonight, as she just got one this week, as well as one 2 weeks ago.   Pt stable at time of discharge.  Assessment: Chronic vaginal pain Unknown etiology of pain, has been told she needs laparoscopy to rule out endometriosis Chronic headaches, possible rebound headaches from opiods.  This one relieved by Reglan alone.  Plan: Discharge home Recommend Reduce use of Percocet Miralax for constipation Rx sent for Reglan  for headaches Advised to call [facility] and see if one of the Faculty MDs can see her.   Advised to  keep appt in September with Urogynecologist  Encouraged to return here or to other Urgent Care/ED if she develops worsening of symptoms, increase in pain, fever, or other concerning symptoms.   Hansel Feinstein CNM, MSN Certified Nurse-Midwife 02/25/2016 5:25 PM

## 2016-02-25 NOTE — Discharge Instructions (Signed)
Migraine Headache A migraine headache is an intense, throbbing pain on one or both sides of your head. A migraine can last for 30 minutes to several hours. CAUSES  The exact cause of a migraine headache is not always known. However, a migraine may be caused when nerves in the brain become irritated and release chemicals that cause inflammation. This causes pain. Certain things may also trigger migraines, such as:  Alcohol.  Smoking.  Stress.  Menstruation.  Aged cheeses.  Foods or drinks that contain nitrates, glutamate, aspartame, or tyramine.  Lack of sleep.  Chocolate.  Caffeine.  Hunger.  Physical exertion.  Fatigue.  Medicines used to treat chest pain (nitroglycerine), birth control pills, estrogen, and some blood pressure medicines. SIGNS AND SYMPTOMS  Pain on one or both sides of your head.  Pulsating or throbbing pain.  Severe pain that prevents daily activities.  Pain that is aggravated by any physical activity.  Nausea, vomiting, or both.  Dizziness.  Pain with exposure to bright lights, loud noises, or activity.  General sensitivity to bright lights, loud noises, or smells. Before you get a migraine, you may get warning signs that a migraine is coming (aura). An aura may include:  Seeing flashing lights.  Seeing bright spots, halos, or zigzag lines.  Having tunnel vision or blurred vision.  Having feelings of numbness or tingling.  Having trouble talking.  Having muscle weakness. DIAGNOSIS  A migraine headache is often diagnosed based on:  Symptoms.  Physical exam.  A CT scan or MRI of your head. These imaging tests cannot diagnose migraines, but they can help rule out other causes of headaches. TREATMENT Medicines may be given for pain and nausea. Medicines can also be given to help prevent recurrent migraines.  HOME CARE INSTRUCTIONS  Only take over-the-counter or prescription medicines for pain or discomfort as directed by your  health care provider. The use of long-term narcotics is not recommended.  Lie down in a dark, quiet room when you have a migraine.  Keep a journal to find out what may trigger your migraine headaches. For example, write down:  What you eat and drink.  How much sleep you get.  Any change to your diet or medicines.  Limit alcohol consumption.  Quit smoking if you smoke.  Get 7-9 hours of sleep, or as recommended by your health care provider.  Limit stress.  Keep lights dim if bright lights bother you and make your migraines worse. SEEK IMMEDIATE MEDICAL CARE IF:   Your migraine becomes severe.  You have a fever.  You have a stiff neck.  You have vision loss.  You have muscular weakness or loss of muscle control.  You start losing your balance or have trouble walking.  You feel faint or pass out.  You have severe symptoms that are different from your first symptoms. MAKE SURE YOU:   Understand these instructions.  Will watch your condition.  Will get help right away if you are not doing well or get worse.   This information is not intended to replace advice given to you by your health care provider. Make sure you discuss any questions you have with your health care provider.   Document Released: 07/03/2005 Document Revised: 07/24/2014 Document Reviewed: 03/10/2013 Elsevier Interactive Patient Education 2016 Elsevier Inc. Pelvic Pain, Female Female pelvic pain can be caused by many different things and start from a variety of places. Pelvic pain refers to pain that is located in the lower half of the abdomen  and between your hips. The pain may occur over a short period of time (acute) or may be reoccurring (chronic). The cause of pelvic pain may be related to disorders affecting the female reproductive organs (gynecologic), but it may also be related to the bladder, kidney stones, an intestinal complication, or muscle or skeletal problems. Getting help right away for  pelvic pain is important, especially if there has been severe, sharp, or a sudden onset of unusual pain. It is also important to get help right away because some types of pelvic pain can be life threatening.  CAUSES  Below are only some of the causes of pelvic pain. The causes of pelvic pain can be in one of several categories.   Gynecologic.  Pelvic inflammatory disease.  Sexually transmitted infection.  Ovarian cyst or a twisted ovarian ligament (ovarian torsion).  Uterine lining that grows outside the uterus (endometriosis).  Fibroids, cysts, or tumors.  Ovulation.  Pregnancy.  Pregnancy that occurs outside the uterus (ectopic pregnancy).  Miscarriage.  Labor.  Abruption of the placenta or ruptured uterus.  Infection.  Uterine infection (endometritis).  Bladder infection.  Diverticulitis.  Miscarriage related to a uterine infection (septic abortion).  Bladder.  Inflammation of the bladder (cystitis).  Kidney stone(s).  Gastrointestinal.  Constipation.  Diverticulitis.  Neurologic.  Trauma.  Feeling pelvic pain because of mental or emotional causes (psychosomatic).  Cancers of the bowel or pelvis. EVALUATION  Your caregiver will want to take a careful history of your concerns. This includes recent changes in your health, a careful gynecologic history of your periods (menses), and a sexual history. Obtaining your family history and medical history is also important. Your caregiver may suggest a pelvic exam. A pelvic exam will help identify the location and severity of the pain. It also helps in the evaluation of which organ system may be involved. In order to identify the cause of the pelvic pain and be properly treated, your caregiver may order tests. These tests may include:   A pregnancy test.  Pelvic ultrasonography.  An X-ray exam of the abdomen.  A urinalysis or evaluation of vaginal discharge.  Blood tests. HOME CARE INSTRUCTIONS   Only  take over-the-counter or prescription medicines for pain, discomfort, or fever as directed by your caregiver.   Rest as directed by your caregiver.   Eat a balanced diet.   Drink enough fluids to make your urine clear or pale yellow, or as directed.   Avoid sexual intercourse if it causes pain.   Apply warm or cold compresses to the lower abdomen depending on which one helps the pain.   Avoid stressful situations.   Keep a journal of your pelvic pain. Write down when it started, where the pain is located, and if there are things that seem to be associated with the pain, such as food or your menstrual cycle.  Follow up with your caregiver as directed.  SEEK MEDICAL CARE IF:  Your medicine does not help your pain.  You have abnormal vaginal discharge. SEEK IMMEDIATE MEDICAL CARE IF:   You have heavy bleeding from the vagina.   Your pelvic pain increases.   You feel light-headed or faint.   You have chills.   You have pain with urination or blood in your urine.   You have uncontrolled diarrhea or vomiting.   You have a fever or persistent symptoms for more than 3 days.  You have a fever and your symptoms suddenly get worse.   You are being physically  or sexually abused.   This information is not intended to replace advice given to you by your health care provider. Make sure you discuss any questions you have with your health care provider.   Document Released: 05/30/2004 Document Revised: 03/24/2015 Document Reviewed: 10/23/2011 Elsevier Interactive Patient Education [employer].

## 2016-02-25 NOTE — ED Triage Notes (Signed)
Patient presents to [facility] with complaint of Vaginal pain since last night 02/24/2016, pt states no odor, burning, or discharge. Pt has taken ibuprofen for pain No acute distress

## 2016-02-25 NOTE — ED Provider Notes (Signed)
CSN: [id number]     Arrival date & time 02/25/16  74 History   First MD Initiated Contact with Patient 02/25/16 1602     Chief Complaint  Patient presents with  . Vaginal Pain   (Consider location/radiation/quality/duration/timing/severity/associated sxs/prior Treatment) HPI 24 Y/O FEMALE WITH 2 DAY HX OF VAGINAL PAIN. FEELS AS IF HER CERVIX IS DILATING. NO VAGINAL DISCHARGE OR BLEEDING. DENIES PREGNANCY.  Past Medical History:  Diagnosis Date  . Anxiety   . Asthma   . Bipolar disorder ([place])    no current med.  . Depression    no current med.  . Family history of adverse reaction to anesthesia    states mother and sister are hard to wake up post-op  . History of seizure 06/20/2012   x 1 - during delivery of child(eclampsia)  . Lipoma of lower back 08/2015  . Migraine   . PTSD (post-traumatic stress disorder)    Past Surgical History:  Procedure Laterality Date  . ADENOIDECTOMY, TONSILLECTOMY AND MYRINGOTOMY WITH TUBE PLACEMENT  1990's  . CESAREAN SECTION  06/20/2012   Procedure: CESAREAN SECTION;  Surgeon: Emily Filbert, MD;  Location: [facility];  Service: Obstetrics;  Laterality: N/A;  . LAPAROSCOPIC APPENDECTOMY  07/25/2012   Procedure: APPENDECTOMY LAPAROSCOPIC;  Surgeon: Zenovia Jarred, MD;  Location: [facility];  Service: General;  Laterality: N/A;  . LAPAROSCOPIC TUBAL LIGATION Bilateral 08/04/2014   Procedure: BILATERAL LAPAROSCOPIC TUBAL LIGATION;  Surgeon: Woodroe Mode, MD;  Location: [facility];  Service: Gynecology;  Laterality: Bilateral;  . LIPOMA EXCISION N/A 09/16/2015   Procedure: EXCISION LIPOMA LUMBAR REGION;  Surgeon: Donnie Mesa, MD;  Location: [facility];  Service: General;  Laterality: N/A;  . WISDOM TOOTH EXTRACTION     Family History  Problem Relation Age of Onset  . Breast cancer Paternal Grandmother   . Colon cancer Paternal Grandfather   . Colon cancer Maternal Grandfather   . Colon polyps Maternal Aunt   . Diabetes Mother   . Anesthesia  problems Mother     hard to wake up post-op  . Diabetes Sister   . Anesthesia problems Sister     hard to wake up post-op  . Diabetes Father   . Heart disease Father    Social History  Substance Use Topics  . Smoking status: Current Every Day Smoker    Packs/day: 0.50    Years: 14.00    Types: Cigarettes  . Smokeless tobacco: Never Used     Comment: less than 1 pack/day  . Alcohol use No   OB History    Gravida Para Term Preterm AB Living   3 3 2 1  0 3   SAB TAB Ectopic Multiple Live Births   0 0 0 0 1      Obstetric Comments   C-Section Indication: Non-reassuring fetal tracing, growth delay & marked proteinuria & Pre-Eclampsia Eclamptic Seizure during C-section delivery     Review of Systems  Denies: HEADACHE, NAUSEA, ABDOMINAL PAIN, CHEST PAIN, CONGESTION, DYSURIA, SHORTNESS OF BREATH  Allergies  Codeine  Home Medications   Prior to Admission medications   Medication Sig Start Date End Date Taking? Authorizing Provider  albuterol (VENTOLIN HFA) 108 (90 Base) MCG/ACT inhaler INHALE 2 PUFFS BY MOUTH EVERY 4 HOURS AS NEEDED FOR WHEEZING OR SHORTNESS OF BREATH 02/08/16  Yes Mercy Riding, MD  butalbital-acetaminophen-caffeine (FIORICET) 50-325-40 MG tablet Take 1-2 tablets by mouth every 6 (six) hours as needed for headache. 02/10/16  Yes Rachelle  A Denney, CNM  ibuprofen (ADVIL,MOTRIN) 800 MG tablet Take 1 tablet (800 mg total) by mouth every 8 (eight) hours as needed. 01/31/16  Yes Shelly Bombard, MD  medroxyPROGESTERone (PROVERA) 10 MG tablet Take 1 tablet (10 mg total) by mouth daily. 01/31/16  Yes Shelly Bombard, MD  oxyCODONE-acetaminophen (PERCOCET) 10-325 MG tablet Take 1 tablet by mouth every 6 (six) hours as needed for pain. 02/14/16  Yes Shelly Bombard, MD  pantoprazole (PROTONIX) 40 MG tablet Take 2 tablets (80 mg total) by mouth daily. 01/24/16  Yes Elberta Leatherwood, MD  Prenatal Vit-Fe Phos-FA-Omega (VITAFOL GUMMIES) 3.33-0.333-34.8 MG CHEW Chew 3 tablets by mouth  daily. 01/13/16  Yes Rachelle A Denney, CNM  eletriptan (RELPAX) 20 MG tablet Take 1 tablet (20 mg total) by mouth as needed for migraine or headache. May repeat in 2 hours if headache persists or recurs. 02/10/16   Rachelle A Denney, CNM  nystatin (MYCOSTATIN) 100000 UNIT/ML suspension Take 5 mLs (500,000 Units total) by mouth 4 (four) times daily. Patient not taking: Reported on 02/14/2016 02/08/16   Marjie Skiff, MD  ondansetron (ZOFRAN) 4 MG tablet Take 1 tablet (4 mg total) by mouth every 8 (eight) hours as needed for nausea or vomiting. 01/13/16   Rachelle A Denney, CNM  simethicone (MYLICON) 0000000 MG chewable tablet Chew 125 mg by mouth every 6 (six) hours as needed for flatulence. Reported on 01/31/2016    Historical Provider, MD  tranexamic acid (LYSTEDA) 650 MG TABS tablet Take 2 tablets (1,300 mg total) by mouth 3 (three) times daily. Patient not taking: Reported on 02/10/2016 01/13/16   Morene Crocker, CNM   Meds Ordered and Administered this Visit  Medications - No data to display  BP 121/85 (BP Location: Right Arm)   Pulse (!) 133   Temp 98.8 F (37.1 C) (Oral)   Resp 18   LMP 01/26/2016 (Exact Date)   SpO2 97%  No data found.   Physical Exam NURSES NOTES AND VITAL SIGNS REVIEWED. CONSTITUTIONAL: Well developed, well nourished, no acute distress HEENT: normocephalic, atraumatic EYES: Conjunctiva normal NECK:normal ROM, supple, no adenopathy PULMONARY:No respiratory distress, normal effort ABDOMINAL: Soft, ND, NT BS+, No CVAT MUSCULOSKELETAL: Normal ROM of all extremities,  SKIN: warm and dry without rash PSYCHIATRIC: Mood and affect, behavior are normal  Urgent Care Course   Clinical Course    Procedures (including critical care time)  Labs Review Labs Reviewed - No data to display  Imaging Review No results found.   Visual Acuity Review  Right Eye Distance:   Left Eye Distance:   Bilateral Distance:    Right Eye Near:   Left Eye Near:    Bilateral  Near:         MDM   1. Vaginal pain    PT NEEDS TO BE SEEN AT [facility].Konrad Felix, [place] 02/25/16 [phone]

## 2016-02-25 NOTE — MAU Note (Signed)
C/o pelvic pain since 0100 this morning after pt was treated for migraine in [facility] ED; pt has tried fiorcet, ibuprofen and percocet without any relief;

## 2016-02-28 ENCOUNTER — Encounter: Payer: Self-pay | Admitting: Obstetrics

## 2016-02-28 ENCOUNTER — Ambulatory Visit (INDEPENDENT_AMBULATORY_CARE_PROVIDER_SITE_OTHER): Payer: Medicaid Other | Admitting: Obstetrics

## 2016-02-28 VITALS — BP 124/81 | HR 123 | Wt 169.0 lb

## 2016-02-28 DIAGNOSIS — R896 Abnormal cytological findings in specimens from other organs, systems and tissues: Secondary | ICD-10-CM | POA: Diagnosis not present

## 2016-02-28 DIAGNOSIS — IMO0002 Reserved for concepts with insufficient information to code with codable children: Secondary | ICD-10-CM

## 2016-02-28 NOTE — Progress Notes (Signed)
Patient ID: Michelle [name], female   DOB: [birth year], 25 y.o.   MRN: [id number]  Chief Complaint  Patient presents with  . Follow-up    colpo results    HPI Michelle [name] is a 25 y.o. female.  S/P colposcopic directed biopsies for LGSIL. Presents for results.  No complaints. HPI  Past Medical History:  Diagnosis Date  . Anxiety   . Asthma   . Bipolar disorder ([place])    no current med.  . Depression    no current med.  . Family history of adverse reaction to anesthesia    states mother and sister are hard to wake up post-op  . Gestational diabetes   . History of seizure 06/20/2012   x 1 - during delivery of child(eclampsia)  . Hypertension   . Lipoma of lower back 08/2015  . Migraine   . PTSD (post-traumatic stress disorder)   . PTSD (post-traumatic stress disorder)   . TMJ (dislocation of temporomandibular joint)   . Vertigo     Past Surgical History:  Procedure Laterality Date  . ADENOIDECTOMY, TONSILLECTOMY AND MYRINGOTOMY WITH TUBE PLACEMENT  1990's  . CESAREAN SECTION  06/20/2012   Procedure: CESAREAN SECTION;  Surgeon: Emily Filbert, MD;  Location: [facility];  Service: Obstetrics;  Laterality: N/A;  . LAPAROSCOPIC APPENDECTOMY  07/25/2012   Procedure: APPENDECTOMY LAPAROSCOPIC;  Surgeon: Zenovia Jarred, MD;  Location: [facility];  Service: General;  Laterality: N/A;  . LAPAROSCOPIC TUBAL LIGATION Bilateral 08/04/2014   Procedure: BILATERAL LAPAROSCOPIC TUBAL LIGATION;  Surgeon: Woodroe Mode, MD;  Location: [facility];  Service: Gynecology;  Laterality: Bilateral;  . LIPOMA EXCISION N/A 09/16/2015   Procedure: EXCISION LIPOMA LUMBAR REGION;  Surgeon: Donnie Mesa, MD;  Location: [facility];  Service: General;  Laterality: N/A;  . WISDOM TOOTH EXTRACTION      Family History  Problem Relation Age of Onset  . Breast cancer Paternal Grandmother   . Colon cancer Paternal Grandfather   . Colon cancer Maternal Grandfather   . Colon polyps Maternal Aunt   . Diabetes  Mother   . Anesthesia problems Mother     hard to wake up post-op  . Diabetes Sister   . Anesthesia problems Sister     hard to wake up post-op  . Diabetes Father   . Heart disease Father     Social History Social History  Substance Use Topics  . Smoking status: Current Every Day Smoker    Packs/day: 1.00    Years: 14.00    Types: Cigarettes  . Smokeless tobacco: Current User     Comment: less than 1 pack/day  . Alcohol use No    Allergies  Allergen Reactions  . Codeine Hives and Shortness Of Breath    Current Outpatient Prescriptions  Medication Sig Dispense Refill  . albuterol (VENTOLIN HFA) 108 (90 Base) MCG/ACT inhaler INHALE 2 PUFFS BY MOUTH EVERY 4 HOURS AS NEEDED FOR WHEEZING OR SHORTNESS OF BREATH 18 g 0  . butalbital-acetaminophen-caffeine (FIORICET) 50-325-40 MG tablet Take 1-2 tablets by mouth every 6 (six) hours as needed for headache. 45 tablet 4  . ibuprofen (ADVIL,MOTRIN) 800 MG tablet Take 1 tablet (800 mg total) by mouth every 8 (eight) hours as needed. (Patient taking differently: Take 800 mg by mouth every 8 (eight) hours as needed for headache or cramping. ) 30 tablet 5  . medroxyPROGESTERone (PROVERA) 10 MG tablet Take 1 tablet (10 mg total) by mouth daily. 30 tablet 0  .  metoCLOPramide (REGLAN) 10 MG tablet Take 1 tablet (10 mg total) by mouth every 6 (six) hours as needed for nausea. 30 tablet 1  . ondansetron (ZOFRAN) 4 MG tablet Take 1 tablet (4 mg total) by mouth every 8 (eight) hours as needed for nausea or vomiting. 30 tablet 1  . oxyCODONE-acetaminophen (PERCOCET) 10-325 MG tablet Take 1 tablet by mouth every 6 (six) hours as needed for pain. 40 tablet 0  . pantoprazole (PROTONIX) 40 MG tablet Take 2 tablets (80 mg total) by mouth daily. 120 tablet 0  . Prenatal Vit-Fe Phos-FA-Omega (VITAFOL GUMMIES) 3.33-0.333-34.8 MG CHEW Chew 3 tablets by mouth daily. 90 tablet PRN   No current facility-administered medications for this visit.     Review of  Systems Review of Systems Constitutional: negative for fatigue and weight loss Respiratory: negative for cough and wheezing Cardiovascular: negative for chest pain, fatigue and palpitations Gastrointestinal: negative for abdominal pain and change in bowel habits Genitourinary:negative Integument/breast: negative for nipple discharge Musculoskeletal:negative for myalgias Neurological: negative for gait problems and tremors Behavioral/Psych: negative for abusive relationship, depression Endocrine: negative for temperature intolerance     Blood pressure 124/81, pulse (!) 123, weight 169 lb (76.7 kg), last menstrual period 01/26/2016.  Physical Exam Physical ExamDeferred  100% of 10 min visit spent on counseling and coordination of care.   Data Reviewed Pathology  Assessment     LGSIL    Plan    Repeat pap 1 year.  No orders of the defined types were placed in this encounter.  No orders of the defined types were placed in this encounter.

## 2016-03-03 ENCOUNTER — Other Ambulatory Visit: Payer: Self-pay | Admitting: Student

## 2016-03-06 ENCOUNTER — Ambulatory Visit (INDEPENDENT_AMBULATORY_CARE_PROVIDER_SITE_OTHER): Payer: Medicaid Other | Admitting: Neurology

## 2016-03-06 ENCOUNTER — Encounter: Payer: Self-pay | Admitting: Neurology

## 2016-03-06 VITALS — BP 118/80 | HR 109 | Ht 62.0 in | Wt 171.3 lb

## 2016-03-06 DIAGNOSIS — G43909 Migraine, unspecified, not intractable, without status migrainosus: Secondary | ICD-10-CM

## 2016-03-06 DIAGNOSIS — G4452 New daily persistent headache (NDPH): Secondary | ICD-10-CM | POA: Diagnosis not present

## 2016-03-06 MED ORDER — KETOROLAC TROMETHAMINE 30 MG/ML IJ SOLN
60.0000 mg | Freq: Once | INTRAMUSCULAR | Status: AC
  Administered 2016-03-06: 60 mg via INTRAMUSCULAR

## 2016-03-06 MED ORDER — DIPHENHYDRAMINE HCL 50 MG/ML IJ SOLN
25.0000 mg | Freq: Once | INTRAMUSCULAR | Status: AC
  Administered 2016-03-06: 25 mg via INTRAMUSCULAR

## 2016-03-06 MED ORDER — METOCLOPRAMIDE HCL 5 MG/ML IJ SOLN
10.0000 mg | Freq: Once | INTRAMUSCULAR | Status: AC
  Administered 2016-03-06: 10 mg via INTRAMUSCULAR

## 2016-03-06 NOTE — Progress Notes (Signed)
Headache cocktail injection to right ventrogluteal with no apparent complications.

## 2016-03-06 NOTE — Progress Notes (Signed)
NEUROLOGY CONSULTATION NOTE  DEBRIA R[place] MRN: [id number] DOB: 09-26-1990  Referring provider: Dr. Kandis Cocking Primary care provider: Dr. Wendee Beavers  Reason for consult:  migraines  Dear Dr Margurite Auerbach:  Thank you for your kind referral of Michelle [name] for consultation of the above symptoms. Although her history is well known to you, please allow me to reiterate it for the purpose of our medical record. Records and images were personally reviewed where available.  HISTORY OF PRESENT ILLNESS: This is a 25 year old right-handed woman with a history of bipolar disorder, PTSD, hypertension, presenting for evaluation of new onset headaches since June 2016. 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 They were so bad that she was shaking on the floor and felt like she would pass out. She went to the ER on 01/06/16 and reports having daily headaches that wax and wane in intensity since then. She would wake up with a headache then towards the evening they become more severe 10/10 with throbbing pain and nausea. She occasionally sees black dots in her vision. No positional component. She stopped Depo-Provera last week. No photo/phonophobia, tinnitus, focal numbness/tingling/weakness, dizziness, diplopia, dysarthria, dysphagia, bowel/bladder dysfunction. She has had neck pains as well. She has chronic back pain.   She has been dealing with significant stomach cramps and will be seeing a specialist regarding concern for endometriosis. She has been taking Tramadol and Percocet daily for the cramps, and has prn Fioricet for the headaches, taking 2-3 daily for the past 2 months. She gets 8-9 hours of sleep but still feels exhausted (for the past couple of years). She denies snoring, she has daytime drowsiness. She was given Reglan as well in the ER  and takes this daily. There is a history of one seizure during delivery of her third child in 2013. No family history of migraines.   PAST MEDICAL HISTORY: Past Medical History:  Diagnosis Date  . Anxiety   . Asthma   . Bipolar disorder ([place])    no current med.  . Depression    no current med.  . Family history of adverse reaction to anesthesia    states mother and sister are hard to wake up post-op  . Gestational diabetes   . History of seizure 06/20/2012   x 1 - during delivery of child(eclampsia)  . Hypertension   . Lipoma of lower back 08/2015  . Migraine   . PTSD (post-traumatic stress disorder)   . PTSD (post-traumatic stress disorder)   . TMJ (dislocation of temporomandibular joint)   . Vertigo     PAST SURGICAL HISTORY: Past Surgical History:  Procedure Laterality Date  . ADENOIDECTOMY, TONSILLECTOMY AND MYRINGOTOMY WITH TUBE PLACEMENT  1990's  . CESAREAN SECTION  06/20/2012   Procedure: CESAREAN SECTION;  Surgeon: Emily Filbert, MD;  Location: [facility];  Service: Obstetrics;  Laterality: N/A;  . LAPAROSCOPIC APPENDECTOMY  07/25/2012   Procedure: APPENDECTOMY LAPAROSCOPIC;  Surgeon: Zenovia Jarred, MD;  Location: [facility];  Service: General;  Laterality: N/A;  . LAPAROSCOPIC TUBAL LIGATION Bilateral 08/04/2014   Procedure: BILATERAL LAPAROSCOPIC TUBAL LIGATION;  Surgeon: Woodroe Mode, MD;  Location: [facility];  Service: Gynecology;  Laterality: Bilateral;  . LIPOMA EXCISION N/A 09/16/2015   Procedure: EXCISION LIPOMA LUMBAR REGION;  Surgeon: Donnie Mesa, MD;  Location: [facility];  Service: General;  Laterality: N/A;  . WISDOM TOOTH EXTRACTION      MEDICATIONS: Current Outpatient Prescriptions on File Prior to Visit  Medication Sig Dispense Refill  . albuterol (VENTOLIN HFA) 108 (90 Base) MCG/ACT inhaler INHALE 2 PUFFS BY MOUTH EVERY 4 HOURS AS NEEDED FOR WHEEZING OR SHORTNESS OF BREATH 18 g 0  . butalbital-acetaminophen-caffeine (FIORICET) 50-325-40 MG tablet  Take 1-2 tablets by mouth every 6 (six) hours as needed for headache. 45 tablet 4  . ibuprofen (ADVIL,MOTRIN) 800 MG tablet Take 1 tablet (800 mg total) by mouth every 8 (eight) hours as needed. (Patient taking differently: Take 800 mg by mouth every 8 (eight) hours as needed for headache or cramping. ) 30 tablet 5  . metoCLOPramide (REGLAN) 10 MG tablet Take 1 tablet (10 mg total) by mouth every 6 (six) hours as needed for nausea. 30 tablet 1  . oxyCODONE-acetaminophen (PERCOCET) 10-325 MG tablet Take 1 tablet by mouth every 6 (six) hours as needed for pain. 40 tablet 0  . pantoprazole (PROTONIX) 40 MG tablet Take 2 tablets (80 mg total) by mouth daily. 120 tablet 0  . Prenatal Vit-Fe Phos-FA-Omega (VITAFOL GUMMIES) 3.33-0.333-34.8 MG CHEW Chew 3 tablets by mouth daily. 90 tablet PRN   No current facility-administered medications on file prior to visit.     ALLERGIES: Allergies  Allergen Reactions  . Codeine Hives and Shortness Of Breath    FAMILY HISTORY: Family History  Problem Relation Age of Onset  . Breast cancer Paternal Grandmother   . Colon cancer Paternal Grandfather   . Colon cancer Maternal Grandfather   . Colon polyps Maternal Aunt   . Diabetes Mother   . Anesthesia problems Mother     hard to wake up post-op  . Diabetes Sister   . Anesthesia problems Sister     hard to wake up post-op  . Diabetes Father   . Heart disease Father     SOCIAL HISTORY: Social History   Social History  . Marital status: Legally Separated    Spouse name: N/A  . Number of children: 2  . Years of education: N/A   Occupational History  . Not on file.   Social History Main Topics  . Smoking status: Current Every Day Smoker    Packs/day: 1.00    Years: 14.00    Types: Cigarettes  . Smokeless tobacco: Current User     Comment: less than 1 pack/day  . Alcohol use No  . Drug use: No  . Sexual activity: Yes    Birth control/ protection: Surgical, None   Other Topics Concern  .  Not on file   Social History Narrative   Lives with boyfriend and 3 children in a one story home.  Does not work.  Education: 9th grade.    REVIEW OF SYSTEMS: Constitutional: No fevers, chills, or sweats, no generalized fatigue, change in appetite Eyes: No visual changes, double vision, eye pain Ear, nose and throat: No hearing loss, ear pain, nasal congestion, sore throat Cardiovascular: No chest pain, palpitations Respiratory:  No shortness of breath at rest or with exertion, wheezes GastrointestinaI: No nausea, vomiting, diarrhea, abdominal pain, fecal incontinence Genitourinary:  No dysuria, urinary retention or frequency Musculoskeletal:  + neck pain, back pain Integumentary: No rash, pruritus, skin lesions Neurological: as above Psychiatric: No depression, insomnia, anxiety Endocrine: No palpitations, fatigue, diaphoresis, mood swings, change in appetite, change in weight, increased thirst Hematologic/Lymphatic:  No anemia, purpura, petechiae. Allergic/Immunologic: no itchy/runny eyes, nasal congestion, recent  allergic reactions, rashes  PHYSICAL EXAM: Vitals:   03/06/16 1242  BP: 118/80  Pulse: (!) 109   General: No acute distress Head:  Normocephalic/atraumatic Eyes: Fundoscopic exam shows bilateral sharp discs, no vessel changes, exudates, or hemorrhages Neck: supple, no paraspinal tenderness, full range of motion Back: No paraspinal tenderness Heart: regular rate and rhythm Lungs: Clear to auscultation bilaterally. Vascular: No carotid bruits. Skin/Extremities: No rash, no edema Neurological Exam: Mental status: alert and oriented to person, place, and time, no dysarthria or aphasia, Fund of knowledge is appropriate.  Recent and remote memory are intact.  Attention and concentration are normal.    Able to name objects and repeat phrases. Cranial nerves: CN I: not tested CN II: pupils equal, round and reactive to light, visual fields intact, fundi unremarkable. CN  III, IV, VI:  full range of motion, no nystagmus, no ptosis CN V: facial sensation intact CN VII: upper and lower face symmetric CN VIII: hearing intact to finger rub CN IX, X: gag intact, uvula midline CN XI: sternocleidomastoid and trapezius muscles intact CN XII: tongue midline Bulk & Tone: normal, no fasciculations. Motor: 5/5 throughout with no pronator drift. Sensation: intact to light touch, cold, pin, vibration and joint position sense.  No extinction to double simultaneous stimulation.  Romberg test negative Deep Tendon Reflexes: +2 throughout, no ankle clonus Plantar responses: downgoing bilaterally Cerebellar: no incoordination on finger to nose, heel to shin. No dysdiadochokinesia Gait: narrow-based and steady, able to tandem walk adequately. Tremor: none  IMPRESSION: This is a 25 year old right-handed woman with a history of bipolar disorder, PTSD, hypertension, presenting for evaluation of new onset daily persistent headaches for the past 2 months. Neurological exam normal. She reports a history of headaches during her menstrual period, but states these headaches are different. There are some migrainous features to her headaches, however since they are now occurring on a daily basis and different, MRI brain without contrast will be ordered to assess for underlying structural abnormality. There may be a component of medication overuse headaches as well, she was instructed to minimize rescue medication to 2-3 a week. She may benefit from starting a daily headache preventative medication, she will start Nortriptyline 10mg  qhs x 1 week, then increase to 20mg  qhs. Side effects were discussed, we will plan to uptitrate as tolerated. She reports an 8/10 headache today and was offered a headache cocktail, however since she drove herself, she will see if she can get a ride. She will follow-up in 2 months and knows to call for any changes.  Thank you for allowing me to participate in the care  of this patient. Please do not hesitate to call for any questions or concerns.   Ellouise Newer, M.D.  CC: Dr. Margurite Auerbach, Dr. Cyndia Skeeters

## 2016-03-06 NOTE — Patient Instructions (Signed)
1. Schedule MRI brain without contrast 2. Start nortriptyline 10mg : take 1 capsule at night for 1 week, then increase to 2 capsules at night 3. Minimize pain medications to 2-3 a week to avoid rebound headaches 4. Follow-up in 2 months, call for any problems

## 2016-03-06 NOTE — Addendum Note (Signed)
Addended byAnnamaria Helling on: 03/06/2016 03:40 PM   Modules accepted: Orders

## 2016-03-07 ENCOUNTER — Telehealth: Payer: Self-pay | Admitting: Neurology

## 2016-03-07 ENCOUNTER — Encounter: Payer: Self-pay | Admitting: Student

## 2016-03-07 MED ORDER — NORTRIPTYLINE HCL 10 MG PO CAPS: ORAL_CAPSULE | ORAL | 3 refills | Status: DC

## 2016-03-07 NOTE — Telephone Encounter (Signed)
Rx sent to pharmacy   

## 2016-03-07 NOTE — Telephone Encounter (Signed)
Michelle [name] 1991/04/23. She was calling in today due to her medication Nortriptyline not being at her pharmacy. She uses [facility] on [address]. Thank you

## 2016-03-14 NOTE — Telephone Encounter (Signed)
PA has been submitted.

## 2016-03-16 ENCOUNTER — Ambulatory Visit
Admission: RE | Admit: 2016-03-16 | Discharge: 2016-03-16 | Disposition: A | Discharge status: Home / Self Care | Payer: Medicaid Other | Source: Ambulatory Visit | Attending: Neurology | Admitting: Neurology

## 2016-03-16 DIAGNOSIS — G4452 New daily persistent headache (NDPH): Secondary | ICD-10-CM

## 2016-03-22 ENCOUNTER — Telehealth: Payer: Self-pay | Admitting: Neurology

## 2016-03-22 NOTE — Telephone Encounter (Signed)
Patient wants to know the results of the MRI please call [phone]

## 2016-03-22 NOTE — Telephone Encounter (Signed)
Pt notified MRI results.

## 2016-03-31 ENCOUNTER — Ambulatory Visit: Payer: Medicaid Other | Admitting: Internal Medicine

## 2016-04-07 ENCOUNTER — Other Ambulatory Visit: Payer: Self-pay

## 2016-04-07 ENCOUNTER — Telehealth: Payer: Self-pay | Admitting: Neurology

## 2016-04-07 MED ORDER — NORTRIPTYLINE HCL 10 MG PO CAPS: ORAL_CAPSULE | ORAL | 3 refills | Status: DC

## 2016-04-07 NOTE — Telephone Encounter (Signed)
Pt notified to increase to 3 caps at night, verbalized understanding. RX sent.

## 2016-04-07 NOTE — Telephone Encounter (Signed)
Pt states she's taking Nortriptyline 10mg  twice at night but for the past 5 days she has had a migraine between 6pm-8pm.

## 2016-04-07 NOTE — Telephone Encounter (Signed)
Yes, let's increase dose. Is she on 1 or 2 capsules? If on 1 capsule, increase to 2 caps at night. If she is already on 2 caps, increase to 3 capsules at night. Pls send new Rx, thanks

## 2016-04-07 NOTE — Telephone Encounter (Signed)
Michelle [name] [birth year]. Her # is [id number]. She said her migraines are back. She would like you to please call her back. She is wondering should she increase her medication? Thank you

## 2016-04-10 DIAGNOSIS — N393 Stress incontinence (female) (male): Secondary | ICD-10-CM

## 2016-04-10 DIAGNOSIS — N3281 Overactive bladder: Secondary | ICD-10-CM | POA: Insufficient documentation

## 2016-04-10 HISTORY — DX: Stress incontinence (female) (male): N39.3

## 2016-04-14 ENCOUNTER — Encounter: Payer: Self-pay | Admitting: Internal Medicine

## 2016-04-14 ENCOUNTER — Ambulatory Visit (INDEPENDENT_AMBULATORY_CARE_PROVIDER_SITE_OTHER): Payer: Medicaid Other | Admitting: Internal Medicine

## 2016-04-14 DIAGNOSIS — R102 Pelvic and perineal pain: Secondary | ICD-10-CM

## 2016-04-14 DIAGNOSIS — S93602D Unspecified sprain of left foot, subsequent encounter: Secondary | ICD-10-CM | POA: Diagnosis present

## 2016-04-14 NOTE — Patient Instructions (Signed)
It was so nice to meet you!  I am so sorry that you're having so much pain.  They have an endometriosis center at [facility]. I will work with our referral coordinator to have you referred there.  -Dr. Brett Albino

## 2016-04-16 DIAGNOSIS — R102 Pelvic and perineal pain: Secondary | ICD-10-CM | POA: Insufficient documentation

## 2016-04-16 NOTE — Assessment & Plan Note (Signed)
Pt has been having pelvic pain for the last 1.5 years. She has associated heavy and painful periods. The pelvic pain is getting worse. She also has dyspareunia. She had a recent pelvic ultrasound performed on 08/11/2015, that showed small amount of nonspecific endometrial fluid. Also had a CT abd/pelvis performed 01/23/2016, showing 3cm left adnexal hemorrhagic cyst. She has also been referred to Ob/gyn, but they do not appear to have worked up her pain. She had a pap recently showing LGSIL and colposcopy was performed, which was normal. Given her severe pain refractory to NSAIDs and OCPs and her associated migraines, there is some concern for endometriosis. Per patient, the Ob/gyn she has been seeing does not do laparoscopy. - Will refer to [facility] for Endometriosis for further evaluation/management. - Return precautions given. - Follow-up in clinic as needed

## 2016-04-16 NOTE — Progress Notes (Signed)
   [facility] Phone: [phone]  Subjective:  Michelle [name] is a 25 year old female presenting to clinic with severe abdominal pain since March 2016. She states she has cramping for 3 weeks out of every month, which "feels like labor". The pain is located across the whole abdomen, but is sometimes worse in the left lower quadrant. She has tried Tylenol and Ibuprofen, but these have not helped. She was also given Tramadol and Percocet by an Ob/gyn, but these haven't helped at all. She sometimes "feels like she can't breathe". She also has associated dyspareunia. The pain is causing her to have difficulty taking care of her kids. The pain is keeping her up at night.   Her last period was at the beginning of September. She has a period every 30 days. Her periods last 7 days. She has to use both a tampon and pad during her period, and has to change these every 2 hours. She is sexually active with one partner. She denies any fevers, vaginal discharge, or vaginal lesions.  ROS: See HPI for pertinent positives and negatives  Past Medical History- AUB, dysmenorrhea, bipolar disorder  Family history reviewed for today's visit. No changes.  Social history- patient is a current smoker  Objective: BP 102/86   Pulse (!) 140   Temp 98.3 F (36.8 C) (Oral)   Wt 170 lb (77.1 kg)   SpO2 96%   BMI 31.09 kg/m  Gen: Tearful throughout exam HEENT: NCAT, EOMI, MMM Resp: Normal work of breathing GI: +BS, soft, non-distended, tenderness to palpation over LLQ, no masses, no rebound or guarding. Msk: No edema, warm, normal tone, moves UE/LE spontaneously Psych: Very tearful, crying throughout visit  Assessment/Plan: Pelvic Pain: Pt has been having pelvic pain for the last 1.5 years. She has associated heavy and painful periods. The pelvic pain is getting worse. She also has dyspareunia. She had a recent pelvic ultrasound performed on 08/11/2015, that showed small amount of nonspecific  endometrial fluid. Also had a CT abd/pelvis performed 01/23/2016, showing 3cm left adnexal hemorrhagic cyst. She has also been referred to Ob/gyn, but they do not appear to have worked up her pain. She had a pap recently showing LGSIL and colposcopy was performed, which was normal. Given her severe pain refractory to NSAIDs and OCPs and her associated migraines, there is some concern for endometriosis. Per patient, the Ob/gyn she has been seeing does not do laparoscopy. - Will refer to [facility] for Endometriosis for further evaluation/management. - Return precautions given. - Follow-up in clinic as needed   Hyman Bible, MD PGY-2

## 2016-04-17 ENCOUNTER — Encounter: Payer: Self-pay | Admitting: Family Medicine

## 2016-04-17 ENCOUNTER — Ambulatory Visit (HOSPITAL_COMMUNITY)
Admission: RE | Admit: 2016-04-17 | Discharge: 2016-04-17 | Disposition: A | Discharge status: Home / Self Care | Payer: Medicaid Other | Source: Ambulatory Visit | Attending: Family Medicine | Admitting: Family Medicine

## 2016-04-17 ENCOUNTER — Ambulatory Visit (INDEPENDENT_AMBULATORY_CARE_PROVIDER_SITE_OTHER): Payer: Medicaid Other | Admitting: Family Medicine

## 2016-04-17 ENCOUNTER — Other Ambulatory Visit: Payer: Self-pay | Admitting: Family Medicine

## 2016-04-17 VITALS — BP 109/71 | HR 128 | Temp 98.2°F | Ht 62.0 in | Wt 172.1 lb

## 2016-04-17 DIAGNOSIS — R918 Other nonspecific abnormal finding of lung field: Secondary | ICD-10-CM | POA: Insufficient documentation

## 2016-04-17 DIAGNOSIS — R059 Cough, unspecified: Secondary | ICD-10-CM

## 2016-04-17 DIAGNOSIS — R05 Cough: Secondary | ICD-10-CM | POA: Insufficient documentation

## 2016-04-17 DIAGNOSIS — S93602D Unspecified sprain of left foot, subsequent encounter: Secondary | ICD-10-CM | POA: Diagnosis not present

## 2016-04-17 MED ORDER — ACETAMINOPHEN-CODEINE 300-30 MG PO TABS: ORAL_TABLET | ORAL | 0 refills | Status: DC

## 2016-04-17 MED ORDER — METHYLPREDNISOLONE ACETATE 80 MG/ML IJ SUSP
80.0000 mg | Freq: Once | INTRAMUSCULAR | Status: AC
Start: 2016-04-17 — End: 2016-04-17
  Administered 2016-04-17: 80 mg via INTRAMUSCULAR

## 2016-04-17 MED ORDER — LEVOFLOXACIN 500 MG PO TABS: 500.0000 mg | ORAL_TABLET | Freq: Every day | ORAL | 0 refills | Status: DC

## 2016-04-17 MED ORDER — ALBUTEROL SULFATE (2.5 MG/3ML) 0.083% IN NEBU
2.5000 mg | INHALATION_SOLUTION | Freq: Once | RESPIRATORY_TRACT | Status: AC
  Administered 2016-04-17: 2.5 mg via RESPIRATORY_TRACT

## 2016-04-17 NOTE — Addendum Note (Signed)
Addended by: Christen Bame D on: 04/17/2016 04:31 PM   Modules accepted: Orders

## 2016-04-17 NOTE — Patient Instructions (Signed)
Stop and get your chest x ray and your antibiotics I am giving you some tylenol #3 for cough If your chest x ray shows something worrisome, we will call you. If it looks OK, then I will send you a note only. Plese make a follow up appointment in a week with your PCP.

## 2016-04-17 NOTE — Progress Notes (Signed)
    CHIEF COMPLAINT / HPI:   Walk in patient C/o SOB, cough that will not let her breath, pain with breathing, similar to a pneumonia she had in July. Has asthma. Feels like she never totally got better after episode in July as far as asthma symptoms. Has had subjecctive fever. Cough is most problematic for her, non productive. Mild sore throat. Not SOB at rest unless she is coughing. Smoker---has not been able to smoke in 2 days. Using her albuterol inhaler with little improvement.   PERTINENT  PMH / PSH: I have reviewed the patient's medications, allergies, past medical and surgical history. Pertinent findings that relate to today's visit / issues include: Asthma Bronchopneumonia 01/2016 on CTA Hx bipolar disorder and PTSD  REVIEW OF SYSTEMS:  Se HPI  OBJECTIVE:   Vital signs reviewed. GENERAL: Well-developed, well-nourished,anxious, multiple coughing episodes, tearful. CARDIOVASCULAR: Regular rate and rhythm no murmur gallop or rub. tavhycardic initially but improved as she calmed down. LUNGS: Initially rhonchous sounds throughout which resolved totally after 1 neb treatment inoffice. POX 98% room air. PSYCH: initially very anxious but significant improvement after we talked. No accessory muscle use.AxOx4.    ASSESSMENT / PLAN: Cough with history of bronchopneumonia seen on CTA in July of this year. I suspect a majority of her symptoms are from reactive airways. I reviewed her CT scan. Sounds like she never got fully back to baseline so I will empirically treat her with levaquin. I also had nurse give her depo shot of steroid. Tylenol #3 for cough. CXR today.   If not improvement or if CXR worrisome I would proceed with  followup CT of chest to make sure her bronchopneumonia cleared.   She will f/u with PCP next week regardless. Briefly discussed smoking cessation. Might benefit from addition of controller inhaler although I suspect smoking cessation woul d be just as useful.

## 2016-04-21 ENCOUNTER — Ambulatory Visit: Payer: Medicaid Other | Admitting: Internal Medicine

## 2016-04-24 ENCOUNTER — Encounter: Payer: Self-pay | Admitting: Internal Medicine

## 2016-04-24 ENCOUNTER — Telehealth: Payer: Self-pay | Admitting: Internal Medicine

## 2016-04-24 NOTE — Telephone Encounter (Signed)
Would like to talk to dr Brett Albino about her pain that she is having

## 2016-04-25 ENCOUNTER — Other Ambulatory Visit: Payer: Self-pay | Admitting: Internal Medicine

## 2016-04-25 DIAGNOSIS — J181 Lobar pneumonia, unspecified organism: Principal | ICD-10-CM

## 2016-04-25 DIAGNOSIS — J189 Pneumonia, unspecified organism: Secondary | ICD-10-CM

## 2016-04-25 MED ORDER — TRAMADOL HCL 50 MG PO TABS: 50.0000 mg | ORAL_TABLET | Freq: Four times a day (QID) | ORAL | 0 refills | Status: DC | PRN

## 2016-04-25 MED ORDER — OXYCODONE-ACETAMINOPHEN 5-325 MG PO TABS: 1.0000 | ORAL_TABLET | Freq: Three times a day (TID) | ORAL | 0 refills | Status: DC | PRN

## 2016-04-25 NOTE — Telephone Encounter (Signed)
Spoke with patient on the phone. Pain medications left up at front desk to be picked up.

## 2016-04-25 NOTE — Progress Notes (Addendum)
Pt calling because she is having severe abdominal pain after starting her period yesterday. She has required the use of Percocet and Tramadol together in the past to get any relief. Has tried Ibuprofen, Tylenol, Naproxen, and Midol. None of which has helped. She almost went the hospital last night because her pain was so bad.   - Will prescribe a short course of Tramadol and Percocet - Discussed with her over the phone that this is not a long term solution - I have referred her to the [facility] for Endometriosis and she is waiting to hear from them to schedule an appointment.  Patient was also seen in clinic recently for chest pain and shortness of breath. She had an x-ray done, which showed early pneumonia. She was treated with Levaquin. It was recommended that she have a repeat x-ray done in 2-3 weeks to ensure resolution. - X-ray order placed. Pt will have this done next week.  Hyman Bible, MD PGY-2

## 2016-04-28 ENCOUNTER — Telehealth: Payer: Self-pay | Admitting: Internal Medicine

## 2016-04-28 ENCOUNTER — Encounter: Payer: Self-pay | Admitting: Neurology

## 2016-04-28 ENCOUNTER — Ambulatory Visit (INDEPENDENT_AMBULATORY_CARE_PROVIDER_SITE_OTHER): Payer: Medicaid Other | Admitting: Neurology

## 2016-04-28 VITALS — BP 110/72 | HR 110 | Temp 98.2°F | Wt 168.1 lb

## 2016-04-28 DIAGNOSIS — G43009 Migraine without aura, not intractable, without status migrainosus: Secondary | ICD-10-CM

## 2016-04-28 MED ORDER — NORTRIPTYLINE HCL 25 MG PO CAPS: ORAL_CAPSULE | ORAL | 6 refills | Status: DC

## 2016-04-28 NOTE — Progress Notes (Signed)
NEUROLOGY FOLLOW UP OFFICE NOTE  PATIANCE Michelle [name] [id number]  HISTORY OF PRESENT ILLNESS: I had the pleasure of seeing Michelle [name] in follow-up in the neurology clinic on 04/28/2016.  She is accompanied by her husband and child today. The patient was last seen 2 months ago for new daily persistent headaches. I personally reviewed MRI brain without contrast which was normal. She was started on nortriptyline, currently on 30mg  qhs and reports that she was not having any migraines at all until this week after her mother had a stroke. Since then, the migraines have recurred. Her father is now living with them and she has been busy going around for him and to the hospital for her mother. She is not sleeping well, usually with 3-4 hours of sleep. She had been taking a cocktail of Decadron, Reglan, and Benadryl for the migraines as prescribed by her OB, which was helping. She does not do this daily. She became tearful, and reports she is not eating due to stomach issues. No side effects on nortriptyline. She has stopped the Fioricet.  HPI 03/06/16: This is a 25 yo RH woman with a history of bipolar disorder, PTSD, hypertension, presenting for evaluation of new onset headaches since June 2016. 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 They were so bad that she was shaking on the floor and felt like she would pass out. She went to the ER on 01/06/16 and reports having daily headaches that wax and wane in intensity since then. She would wake up with a headache then towards the evening they become more severe 10/10 with throbbing pain and nausea. She occasionally sees black dots in her vision. No positional component. She stopped Depo-Provera last week. No photo/phonophobia, tinnitus, focal numbness/tingling/weakness, dizziness, diplopia, dysarthria,  dysphagia, bowel/bladder dysfunction. She has had neck pains as well. She has chronic back pain.   She has been dealing with significant stomach cramps and will be seeing a specialist regarding concern for endometriosis. She has been taking Tramadol and Percocet daily for the cramps, and has prn Fioricet for the headaches, taking 2-3 daily for the past 2 months. She gets 8-9 hours of sleep but still feels exhausted (for the past couple of years). She denies snoring, she has daytime drowsiness. She was given Reglan as well in the ER and takes this daily. There is a history of one seizure during delivery of her third child in 2013. No family history of migraines.   PAST MEDICAL HISTORY: Past Medical History:  Diagnosis Date  . Anxiety   . Asthma   . Bipolar disorder ([place])    no current med.  . Depression    no current med.  . Family history of adverse reaction to anesthesia    states mother and sister are hard to wake up post-op  . Gestational diabetes   . History of seizure 06/20/2012   x 1 - during delivery of child(eclampsia)  . Hypertension   . Lipoma of lower back 08/2015  . Migraine   . PTSD (post-traumatic stress disorder)   . PTSD (post-traumatic stress disorder)   . TMJ (dislocation of temporomandibular joint)   . Vertigo     MEDICATIONS: Current Outpatient Prescriptions on File Prior to Visit  Medication Sig Dispense Refill  . Acetaminophen-Codeine 300-30 MG tablet Take 1 or 2 every 6-8 hours  prn cough 45 tablet 0  . butalbital-acetaminophen-caffeine (FIORICET) 50-325-40 MG tablet Take 1-2 tablets by mouth every 6 (six) hours as needed for headache. 45 tablet 4  . dexamethasone (DECADRON) 6 MG tablet   1  . diphenhydrAMINE (BENADRYL) 25 mg capsule TK 2 CS PO Q 6 H PRN  0  . levofloxacin (LEVAQUIN) 500 MG tablet Take 1 tablet (500 mg total) by mouth daily. 7 tablet 0  . metoCLOPramide (REGLAN) 10 MG tablet Take 1 tablet (10 mg total) by mouth every 6 (six) hours as needed  for nausea. 30 tablet 1  . nortriptyline (PAMELOR) 10 MG capsule Take three (3) capsules at bedtime. 90 capsule 3  . oxyCODONE-acetaminophen (ROXICET) 5-325 MG tablet Take 1 tablet by mouth every 8 (eight) hours as needed for severe pain. 15 tablet 0  . pantoprazole (PROTONIX) 40 MG tablet TAKE 2 TABLETS BY MOUTH DAILY 120 tablet 0  . Prenatal Vit-Fe Phos-FA-Omega (VITAFOL GUMMIES) 3.33-0.333-34.8 MG CHEW Chew 3 tablets by mouth daily. 90 tablet PRN  . traMADol (ULTRAM) 50 MG tablet Take 1 tablet (50 mg total) by mouth every 6 (six) hours as needed. 15 tablet 0  . VENTOLIN HFA 108 (90 Base) MCG/ACT inhaler INHALE 2 PUFFS BY MOUTH EVERY 4 HOURS AS NEEDED FOR WHEEZING OR SHORTNESS OF BREATH 18 g 0   No current facility-administered medications on file prior to visit.     ALLERGIES: Allergies  Allergen Reactions  . Codeine Hives and Shortness Of Breath    FAMILY HISTORY: Family History  Problem Relation Age of Onset  . Breast cancer Paternal Grandmother   . Colon cancer Paternal Grandfather   . Colon cancer Maternal Grandfather   . Colon polyps Maternal Aunt   . Diabetes Mother   . Anesthesia problems Mother     hard to wake up post-op  . Diabetes Sister   . Anesthesia problems Sister     hard to wake up post-op  . Diabetes Father   . Heart disease Father     SOCIAL HISTORY: Social History   Social History  . Marital status: Legally Separated    Spouse name: N/A  . Number of children: 2  . Years of education: N/A   Occupational History  . Not on file.   Social History Main Topics  . Smoking status: Current Every Day Smoker    Packs/day: 1.00    Years: 14.00    Types: Cigarettes  . Smokeless tobacco: Current User     Comment: less than 1 pack/day  . Alcohol use No  . Drug use: No  . Sexual activity: Yes    Birth control/ protection: Surgical, None   Other Topics Concern  . Not on file   Social History Narrative   Lives with boyfriend and 3 children in a one  story home.  Does not work.  Education: 9th grade.    REVIEW OF SYSTEMS: Constitutional: No fevers, chills, or sweats, no generalized fatigue, change in appetite Eyes: No visual changes, double vision, eye pain Ear, nose and throat: No hearing loss, ear pain, nasal congestion, sore throat Cardiovascular: No chest pain, palpitations Respiratory:  No shortness of breath at rest or with exertion, wheezes GastrointestinaI: No nausea, vomiting, diarrhea, abdominal pain, fecal incontinence Genitourinary:  No dysuria, urinary retention or frequency Musculoskeletal:  No neck pain, back pain Integumentary: No rash, pruritus, skin lesions Neurological: as above Psychiatric: + depression, insomnia, anxiety Endocrine: No palpitations, fatigue, diaphoresis, mood swings, change in appetite, change in  weight, increased thirst Hematologic/Lymphatic:  No anemia, purpura, petechiae. Allergic/Immunologic: no itchy/runny eyes, nasal congestion, recent allergic reactions, rashes  PHYSICAL EXAM: Vitals:   04/28/16 1035  BP: 110/72  Pulse: (!) 110  Temp: 98.2 F (36.8 C)   General: No acute distress Head:  Normocephalic/atraumatic Neck: supple, no paraspinal tenderness, full range of motion Heart:  Regular rate and rhythm Lungs:  Clear to auscultation bilaterally Back: No paraspinal tenderness Skin/Extremities: No rash, no edema Neurological Exam: alert and oriented to person, place, and time. No aphasia or dysarthria. Fund of knowledge is appropriate.  Recent and remote memory are intact.  Attention and concentration are normal.    Able to name objects and repeat phrases. Cranial nerves: Pupils equal, round, reactive to light.  Fundoscopic exam unremarkable, no papilledema. Extraocular movements intact with no nystagmus. Visual fields full. Facial sensation intact. No facial asymmetry. Tongue, uvula, palate midline.  Motor: Bulk and tone normal, muscle strength 5/5 throughout with no pronator drift.   Sensation to light touch intact.  No extinction to double simultaneous stimulation.  Deep tendon reflexes 2+ throughout, toes downgoing.  Finger to nose testing intact.  Gait narrow-based and steady, able to tandem walk adequately.  Romberg negative.  IMPRESSION: This is a 25 yo RH woman with a history of bipolar disorder, PTSD, hypertension, who presented for new onset daily headaches with migranous features. MRI brain normal. She had a good response to nortriptyline with resolution of headaches, however with recent stressors, migraines have recurred. She will increase dose of nortriptyline to 40mg  qhs x 1 week, then 50mg  qhs. We discussed the importance of sleep hygiene. She takes a cocktail of Decadron, Benadryl, and Reglan from her OB for the migraines, and knows to minimize rescue medications to 2-3 times a week to avoid rebound headaches. She will follow-up in 3 months and knows to call for any changes.  Thank you for allowing me to participate in her care.  Please do not hesitate to call for any questions or concerns.  The duration of this appointment visit was 25 minutes of face-to-face time with the patient.  Greater than 50% of this time was spent in counseling, explanation of diagnosis, planning of further management, and coordination of care.   Ellouise Newer, M.D.   CC: Dr. Brett Albino

## 2016-04-28 NOTE — Telephone Encounter (Signed)
Will forward to MD to advise. [name] Hartsell,CMA  

## 2016-04-28 NOTE — Telephone Encounter (Signed)
Pt is calling and would like to speak to the doctor about some issues she is having with anxiety and some depression. Her mother just a three strokes and is in the hospital. She is having some issues with all the pressure. She schedule an appointment for 05/02/16 to discuss. jw

## 2016-04-28 NOTE — Patient Instructions (Signed)
1. Increase nortriptyline 10mg : Take 4 capsules at night. Once done with your current bottle, your new prescription will be for nortriptyline 25mg : Take 2 capsules at night 2. Follow-up in 3 months, call for any changes

## 2016-05-01 NOTE — Telephone Encounter (Signed)
Spoke with patient on the phone. She states she has hardly been able to eat and sleep with everything going on with her Mom. She has an appointment with me tomorrow. We will discuss this more then. Think she may benefit from therapy.

## 2016-05-02 ENCOUNTER — Encounter: Payer: Self-pay | Admitting: Internal Medicine

## 2016-05-02 ENCOUNTER — Ambulatory Visit (INDEPENDENT_AMBULATORY_CARE_PROVIDER_SITE_OTHER): Payer: Medicaid Other | Admitting: Internal Medicine

## 2016-05-02 DIAGNOSIS — S93602D Unspecified sprain of left foot, subsequent encounter: Secondary | ICD-10-CM | POA: Diagnosis not present

## 2016-05-02 DIAGNOSIS — F4320 Adjustment disorder, unspecified: Secondary | ICD-10-CM

## 2016-05-02 DIAGNOSIS — F4321 Adjustment disorder with depressed mood: Secondary | ICD-10-CM

## 2016-05-02 MED ORDER — TRAZODONE HCL 50 MG PO TABS: 50.0000 mg | ORAL_TABLET | Freq: Every evening | ORAL | 0 refills | Status: DC | PRN

## 2016-05-02 NOTE — Progress Notes (Signed)
   [facility] Phone: [phone]  Subjective:  Alberto is a 25 year old female presenting to clinic with anxiety and depression surrounding her mom's recent stroke. Her mother is currently hospitalized on our inpatient service for acute stroke that occurred last week. She is doing well overall and is going to be transferred to inpatient rehabilitation today. Millette states that she has had a very difficult time dealing with her mother's illness. She states that her mom is her best friend and she cannot believe that this happened to her. She states that her mother's illness has taken a toll on her ability to function. She has had a very difficult time sleeping. She states that when she closes her eyes at night she just pictures her mom laying in a hospital bed, "looking like she is dead". She is having difficulty with both falling asleep and staying asleep. She has also noticed reduced appetite. She states she has eaten very little because she just doesn't feel hungry. She states that she has a lot of support from her boyfriend who lives with her. She is overall hopeful that her mother will improve.  ROS: See HPI for pertinent positives and negatives  Past Medical History- migraines, dysmenorrhea, bipolar disorder, tobacco use, PTSD  Family history reviewed for today's visit. No changes.  Social history- patient is a current smoker. Interested in quitting once all of this acute stress gets better.  Objective: BP 129/84   Pulse (!) 118   Temp 98.7 F (37.1 C) (Oral)   Ht 5\' 2"  (1.575 m)   Wt 168 lb (76.2 kg)   LMP 04/23/2016   BMI 30.73 kg/m  Gen: Tearful throughout entire appointment HEENT: NCAT, EOMI, MMM CV: RRR, no murmur Resp: CTABL, no wheezes, normal work of breathing Msk: Moves UE/LE spontaneously Neuro: Alert and oriented, no gross deficits Skin: No rashes, no lesions Psych: Appears mildly anxious, talking quickly during certain parts of the discussion, very  tearful.  Assessment/Plan: Grief Reaction: She is experiencing a lot of grief surrounding her mother's recent stroke. She states that she has been terrified of losing her mother, who is her best friend. She has also had to take on a caregiver role in her family, which has caused a lot of stress. Her main symptom that is causing her distress is an inability to sleep. No signs of SI/HI. GAD-7 score 14. PHQ-9 score 16. - We will prescribe a short dose of trazodone to help her sleep. I think that getting better sleep will help her cope with the stressors in her life. - Discussed bringing in our behavioral health consultant or referring patient to therapy. Patient states that she has been to a therapist in the past and did not find this helpful. - We also discussed that her feelings are a part of a normal grief reaction. I think that this will get better with time. - Patient will follow-up as needed. We discussed that she can call our office if she has any concerns.    Hyman Bible, MD PGY-2

## 2016-05-02 NOTE — Assessment & Plan Note (Signed)
She is experiencing a lot of grief surrounding her mother's recent stroke. She states that she has been terrified of losing her mother, who is her best friend. She has also had to take on a caregiver role in her family, which has caused a lot of stress. Her main symptom that is causing her distress is an inability to sleep. No signs of SI/HI. GAD-7 score 14. PHQ-9 score 16. - We will prescribe a short dose of trazodone to help her sleep. I think that getting better sleep will help her cope with the stressors in her life. - Discussed bringing in our behavioral health consultant or referring patient to therapy. Patient states that she has been to a therapist in the past and did not find this helpful. - We also discussed that her feelings are a part of a normal grief reaction. I think that this will get better with time. - Patient will follow-up as needed. We discussed that she can call our office if she has any concerns.

## 2016-05-02 NOTE — Patient Instructions (Signed)
It was so nice to see you!  I have prescribed Trazodone for sleep. You should take 1 tablet at night.  If there is anything I can do for you or your family, please do not hesitate to ask.  -Dr. Brett Albino

## 2016-05-03 ENCOUNTER — Ambulatory Visit (HOSPITAL_COMMUNITY)
Admission: RE | Admit: 2016-05-03 | Discharge: 2016-05-03 | Disposition: A | Discharge status: Home / Self Care | Payer: Medicaid Other | Source: Ambulatory Visit | Attending: Family Medicine | Admitting: Family Medicine

## 2016-05-03 DIAGNOSIS — J181 Lobar pneumonia, unspecified organism: Secondary | ICD-10-CM | POA: Insufficient documentation

## 2016-05-03 DIAGNOSIS — J189 Pneumonia, unspecified organism: Secondary | ICD-10-CM

## 2016-05-04 ENCOUNTER — Other Ambulatory Visit: Payer: Self-pay | Admitting: Certified Nurse Midwife

## 2016-05-04 ENCOUNTER — Encounter: Payer: Self-pay | Admitting: Certified Nurse Midwife

## 2016-05-04 ENCOUNTER — Ambulatory Visit (INDEPENDENT_AMBULATORY_CARE_PROVIDER_SITE_OTHER): Payer: Medicaid Other

## 2016-05-04 DIAGNOSIS — N83202 Unspecified ovarian cyst, left side: Secondary | ICD-10-CM

## 2016-05-09 ENCOUNTER — Other Ambulatory Visit: Payer: Self-pay | Admitting: Certified Nurse Midwife

## 2016-05-14 ENCOUNTER — Other Ambulatory Visit: Payer: Self-pay | Admitting: Student

## 2016-06-15 ENCOUNTER — Telehealth: Payer: Self-pay | Admitting: *Deleted

## 2016-06-16 MED ORDER — TRAZODONE HCL 50 MG PO TABS: 50.0000 mg | ORAL_TABLET | Freq: Every evening | ORAL | 0 refills | Status: DC | PRN

## 2016-06-18 ENCOUNTER — Ambulatory Visit (HOSPITAL_COMMUNITY)
Admission: EM | Admit: 2016-06-18 | Discharge: 2016-06-18 | Disposition: A | Discharge status: Home / Self Care | Payer: Medicaid Other | Attending: Emergency Medicine | Admitting: Emergency Medicine

## 2016-06-18 ENCOUNTER — Encounter (HOSPITAL_COMMUNITY): Payer: Self-pay | Admitting: Emergency Medicine

## 2016-06-18 DIAGNOSIS — L0291 Cutaneous abscess, unspecified: Secondary | ICD-10-CM | POA: Diagnosis not present

## 2016-06-18 MED ORDER — CEPHALEXIN 500 MG PO CAPS: 500.0000 mg | ORAL_CAPSULE | Freq: Three times a day (TID) | ORAL | 0 refills | Status: AC

## 2016-06-18 NOTE — ED Provider Notes (Signed)
CSN: [id number]     Arrival date & time 06/18/16  1553 History   None    Chief Complaint  Patient presents with  . Abscess   (Consider location/radiation/quality/duration/timing/severity/associated sxs/prior Treatment) 25 year old female presents with an abscess on her right inner thigh near her groin for the past 2 days. Started with redness, swelling and some drainage yesterday- now more painful and no longer draining. Tried to squeeze pus out but not successful. No history of previous abscesses.    The history is provided by the patient and a significant other.  Abscess  Associated symptoms: no fatigue, no fever and no headaches     Past Medical History:  Diagnosis Date  . Anxiety   . Asthma   . Bipolar disorder ([place])    no current med.  . Depression    no current med.  . Family history of adverse reaction to anesthesia    states mother and sister are hard to wake up post-op  . Gestational diabetes   . History of seizure 06/20/2012   x 1 - during delivery of child(eclampsia)  . Hypertension   . Lipoma of lower back 08/2015  . Migraine   . PTSD (post-traumatic stress disorder)   . PTSD (post-traumatic stress disorder)   . TMJ (dislocation of temporomandibular joint)   . Vertigo    Past Surgical History:  Procedure Laterality Date  . ADENOIDECTOMY, TONSILLECTOMY AND MYRINGOTOMY WITH TUBE PLACEMENT  1990's  . CESAREAN SECTION  06/20/2012   Procedure: CESAREAN SECTION;  Surgeon: Emily Filbert, MD;  Location: [facility];  Service: Obstetrics;  Laterality: N/A;  . LAPAROSCOPIC APPENDECTOMY  07/25/2012   Procedure: APPENDECTOMY LAPAROSCOPIC;  Surgeon: Zenovia Jarred, MD;  Location: [facility];  Service: General;  Laterality: N/A;  . LAPAROSCOPIC TUBAL LIGATION Bilateral 08/04/2014   Procedure: BILATERAL LAPAROSCOPIC TUBAL LIGATION;  Surgeon: Woodroe Mode, MD;  Location: [facility];  Service: Gynecology;  Laterality: Bilateral;  . LIPOMA EXCISION N/A 09/16/2015   Procedure: EXCISION LIPOMA  LUMBAR REGION;  Surgeon: Donnie Mesa, MD;  Location: [facility];  Service: General;  Laterality: N/A;  . WISDOM TOOTH EXTRACTION     Family History  Problem Relation Age of Onset  . Breast cancer Paternal Grandmother   . Colon cancer Paternal Grandfather   . Colon cancer Maternal Grandfather   . Colon polyps Maternal Aunt   . Diabetes Mother   . Anesthesia problems Mother     hard to wake up post-op  . Diabetes Sister   . Anesthesia problems Sister     hard to wake up post-op  . Diabetes Father   . Heart disease Father    Social History  Substance Use Topics  . Smoking status: Current Every Day Smoker    Packs/day: 1.00    Years: 14.00    Types: Cigarettes  . Smokeless tobacco: Current User     Comment: less than 1 pack/day  . Alcohol use No   OB History    Gravida Para Term Preterm AB Living   3 3 2 1  0 3   SAB TAB Ectopic Multiple Live Births   0 0 0 0 1      Obstetric Comments   C-Section Indication: Non-reassuring fetal tracing, growth delay & marked proteinuria & Pre-Eclampsia Eclamptic Seizure during C-section delivery     Review of Systems  Constitutional: Negative for chills, fatigue and fever.  Respiratory: Negative for chest tightness and shortness of breath.  Cardiovascular: Negative for chest pain.  Gastrointestinal: Negative for abdominal pain.  Genitourinary: Negative for dysuria, genital sores, pelvic pain and vaginal discharge.  Skin: Positive for wound.  Neurological: Negative for dizziness, weakness and headaches.  Hematological: Negative for adenopathy.    Allergies  Codeine  Home Medications   Prior to Admission medications   Medication Sig Start Date End Date Taking? Authorizing Provider  butalbital-acetaminophen-caffeine (FIORICET) 50-325-40 MG tablet Take 1-2 tablets by mouth every 6 (six) hours as needed for headache. 02/10/16  Yes Rachelle A Denney, CNM  gabapentin (NEURONTIN) 300 MG capsule Take 300 mg by mouth 3  (three) times daily.   Yes Historical Provider, MD  nortriptyline (PAMELOR) 25 MG capsule Take 2 capsules at night 04/28/16  Yes Cameron Sprang, MD  pantoprazole (PROTONIX) 40 MG tablet TAKE 2 TABLETS BY MOUTH DAILY 04/17/16  Yes Sela Hua, MD  PROAIR HFA 108 [phone] Base) MCG/ACT inhaler INHALE 2 PUFFS INTO THE LUNGS EVERY 4 HOURS AS NEEDED FOR WHEEZING OR SHORTNESS OF BREATH 05/15/16  Yes Sela Hua, MD  traMADol (ULTRAM) 50 MG tablet Take 1 tablet (50 mg total) by mouth every 6 (six) hours as needed. 04/25/16 04/25/17 Yes Sela Hua, MD  traZODone (DESYREL) 50 MG tablet Take 1 tablet (50 mg total) by mouth at bedtime as needed for sleep. 06/16/16  Yes Sela Hua, MD  cephALEXin (KEFLEX) 500 MG capsule Take 1 capsule (500 mg total) by mouth 3 (three) times daily. 06/18/16 06/25/16  Katy Apo, NP  dexamethasone (DECADRON) 6 MG tablet  01/14/16   Historical Provider, MD  diphenhydrAMINE (BENADRYL) 25 mg capsule TK 2 CS PO Q 6 H PRN 01/13/16   Historical Provider, MD  metoCLOPramide (REGLAN) 10 MG tablet Take 1 tablet (10 mg total) by mouth every 6 (six) hours as needed for nausea. 02/25/16   Seabron Spates, CNM  Prenatal Vit-Fe Phos-FA-Omega (VITAFOL GUMMIES) 3.33-0.333-34.8 MG CHEW Chew 3 tablets by mouth daily. 01/13/16   Morene Crocker, CNM   Meds Ordered and Administered this Visit  Medications - No data to display  BP 122/60 (BP Location: Right Arm)   Pulse (!) 123   Temp 98.3 F (36.8 C) (Oral)   Resp 18   LMP 06/18/2016   SpO2 98%  No data found.   Physical Exam  Constitutional: She is oriented to person, place, and time. She appears well-developed and well-nourished. No distress.  Cardiovascular: Regular rhythm and normal heart sounds.  Tachycardia present.   Tachycardic due to anxiety  Pulmonary/Chest: Effort normal and breath sounds normal.  Musculoskeletal: Normal range of motion.       Right upper leg: She exhibits tenderness and swelling. She exhibits no  laceration.       Legs: Small 1cm oval abscess present on right inner thigh near her groin. Red, swollen with no discharge. Redness extends another 1cm. Very tender. Abscess located between keloid marks.   Neurological: She is alert and oriented to person, place, and time.  Skin: Skin is warm, dry and intact. Capillary refill takes less than 2 seconds. Lesion noted. There is erythema.  Psychiatric: Her speech is normal and behavior is normal. Her mood appears anxious.    Urgent Care Course   Clinical Course     .Marland KitchenIncision and Drainage Date/Time: 06/18/2016 6:15 PM Performed by: Melvyn Neth, Vance Belcourt BERRY Authorized by: Melony Overly   Consent:    Consent obtained:  Verbal   Consent given by:  Patient  Risks discussed:  Incomplete drainage, infection and pain   Alternatives discussed:  Delayed treatment, observation and alternative treatment Location:    Type:  Abscess   Size:  1cm   Location:  Lower extremity   Lower extremity location:  Leg   Leg location:  L upper leg Pre-procedure details:    Skin preparation:  Betadine Anesthesia (see MAR for exact dosages):    Anesthesia method:  Topical application Procedure type:    Complexity:  Simple Procedure details:    Needle aspiration: no     Incision types:  Single straight   Incision depth:  Dermal   Scalpel blade:  11   Drainage:  Purulent and bloody   Drainage amount:  Moderate   Wound treatment:  Wound left open   Packing materials:  None Post-procedure details:    Patient tolerance of procedure:  Tolerated well, no immediate complications Comments:     Applied Bacitracin ointment to area and covered with gauze.     (including critical care time)  Labs Review Labs Reviewed - No data to display  Imaging Review No results found.   Visual Acuity Review  Right Eye Distance:   Left Eye Distance:   Bilateral Distance:    Right Eye Near:   Left Eye Near:    Bilateral Near:         MDM   1. Abscess    I &  D Abscess with success. Recommend start Keflex 500mg  3 times a day as directed. Soak in warm water and apply warm compresses to area to facilitate draining. May apply Neosporin ointment and cover with bandage. Follow-up in 3 to 4 days if not improving.      Katy Apo, NP 06/19/16 [phone]

## 2016-06-18 NOTE — Discharge Instructions (Signed)
Start Keflex 3 times a day as directed. Apply warm compresses/soaks to area for comfort. May apply Neosporin ointment and cover with a bandage. Follow-up in 3 to 4 days if not improving.

## 2016-06-18 NOTE — ED Triage Notes (Signed)
Here for abscess/ingrown hair on inner right thigh onset yest  Sx include: swelling, pain, white/greenish drainage, localized fever  A&O x4... NAD

## 2016-06-26 ENCOUNTER — Other Ambulatory Visit: Payer: Self-pay | Admitting: Internal Medicine

## 2016-06-26 MED ORDER — PANTOPRAZOLE SODIUM 40 MG PO TBEC: 80.0000 mg | DELAYED_RELEASE_TABLET | Freq: Every day | ORAL | 1 refills | Status: DC

## 2016-06-26 NOTE — Telephone Encounter (Signed)
Medication refilled. I called patient to let her know.

## 2016-06-26 NOTE — Telephone Encounter (Signed)
Pt would like someone to call her and explain to her why pantoprazole sodium refill was denied. Please advise, thanks! ep

## 2016-06-26 NOTE — Telephone Encounter (Signed)
Will check with MD since there was no explanation given. Quintin Hjort,CMA

## 2016-06-29 ENCOUNTER — Ambulatory Visit (HOSPITAL_COMMUNITY)
Admission: EM | Admit: 2016-06-29 | Discharge: 2016-06-29 | Disposition: A | Discharge status: Home / Self Care | Payer: Medicaid Other | Attending: Emergency Medicine | Admitting: Emergency Medicine

## 2016-06-29 ENCOUNTER — Encounter (HOSPITAL_COMMUNITY): Payer: Self-pay | Admitting: Emergency Medicine

## 2016-06-29 ENCOUNTER — Ambulatory Visit (INDEPENDENT_AMBULATORY_CARE_PROVIDER_SITE_OTHER): Payer: Medicaid Other

## 2016-06-29 DIAGNOSIS — N920 Excessive and frequent menstruation with regular cycle: Secondary | ICD-10-CM | POA: Insufficient documentation

## 2016-06-29 DIAGNOSIS — R5381 Other malaise: Secondary | ICD-10-CM | POA: Diagnosis not present

## 2016-06-29 DIAGNOSIS — N946 Dysmenorrhea, unspecified: Secondary | ICD-10-CM | POA: Diagnosis not present

## 2016-06-29 DIAGNOSIS — J45909 Unspecified asthma, uncomplicated: Secondary | ICD-10-CM | POA: Diagnosis not present

## 2016-06-29 DIAGNOSIS — F319 Bipolar disorder, unspecified: Secondary | ICD-10-CM | POA: Insufficient documentation

## 2016-06-29 DIAGNOSIS — R102 Pelvic and perineal pain: Secondary | ICD-10-CM | POA: Insufficient documentation

## 2016-06-29 DIAGNOSIS — G43909 Migraine, unspecified, not intractable, without status migrainosus: Secondary | ICD-10-CM | POA: Diagnosis not present

## 2016-06-29 DIAGNOSIS — M542 Cervicalgia: Secondary | ICD-10-CM | POA: Diagnosis not present

## 2016-06-29 DIAGNOSIS — R87612 Low grade squamous intraepithelial lesion on cytologic smear of cervix (LGSIL): Secondary | ICD-10-CM | POA: Insufficient documentation

## 2016-06-29 DIAGNOSIS — R071 Chest pain on breathing: Secondary | ICD-10-CM | POA: Insufficient documentation

## 2016-06-29 DIAGNOSIS — H8110 Benign paroxysmal vertigo, unspecified ear: Secondary | ICD-10-CM | POA: Insufficient documentation

## 2016-06-29 DIAGNOSIS — D171 Benign lipomatous neoplasm of skin and subcutaneous tissue of trunk: Secondary | ICD-10-CM | POA: Insufficient documentation

## 2016-06-29 DIAGNOSIS — I10 Essential (primary) hypertension: Secondary | ICD-10-CM | POA: Insufficient documentation

## 2016-06-29 DIAGNOSIS — H5340 Unspecified visual field defects: Secondary | ICD-10-CM | POA: Insufficient documentation

## 2016-06-29 DIAGNOSIS — R0789 Other chest pain: Secondary | ICD-10-CM

## 2016-06-29 DIAGNOSIS — F17210 Nicotine dependence, cigarettes, uncomplicated: Secondary | ICD-10-CM | POA: Diagnosis not present

## 2016-06-29 DIAGNOSIS — R079 Chest pain, unspecified: Secondary | ICD-10-CM

## 2016-06-29 DIAGNOSIS — R0602 Shortness of breath: Secondary | ICD-10-CM | POA: Diagnosis present

## 2016-06-29 DIAGNOSIS — F4310 Post-traumatic stress disorder, unspecified: Secondary | ICD-10-CM | POA: Insufficient documentation

## 2016-06-29 DIAGNOSIS — F4320 Adjustment disorder, unspecified: Secondary | ICD-10-CM | POA: Insufficient documentation

## 2016-06-29 DIAGNOSIS — S93602D Unspecified sprain of left foot, subsequent encounter: Secondary | ICD-10-CM | POA: Diagnosis not present

## 2016-06-29 DIAGNOSIS — K0510 Chronic gingivitis, plaque induced: Secondary | ICD-10-CM | POA: Insufficient documentation

## 2016-06-29 LAB — D-DIMER, QUANTITATIVE (NOT AT ARMC): D-Dimer, Quant: 0.33 ug/mL-FEU (ref 0.00–0.50)

## 2016-06-29 NOTE — Discharge Instructions (Signed)
Your test are negative you will need to follow up with you pcp  If sx are worse you will need to go to the ER

## 2016-06-29 NOTE — ED Provider Notes (Addendum)
[facility]    CSN: [id number] Arrival date & time: 06/29/16  1653     History   Chief Complaint No chief complaint on file.   HPI Michelle [name] is a 25 y.o. female.  PTSD Bipolar Painful heavy peridos foll Dr. Jodi Mourning OB Also LGSIL Current smoker Recent visit to ED for Inner thigh abcess prior hospitalization for bronchopna this yr  At baseline she has a chronic tachycardia in the 140's as well as a blod pressur ein the [id number] systolic range  States that she has had some abd pain and pressure her abdomen  No n/v/cp No chills no rigors No rash States has felt however "ill"  Patient has cut back on smoking recently form 1ppd-->1/2 ppd   HPI  Past Medical History:  Diagnosis Date  . Anxiety   . Asthma   . Bipolar disorder ([place])    no current med.  . Depression    no current med.  . Family history of adverse reaction to anesthesia    states mother and sister are hard to wake up post-op  . Gestational diabetes   . History of seizure 06/20/2012   x 1 - during delivery of child(eclampsia)  . Hypertension   . Lipoma of lower back 08/2015  . Migraine   . PTSD (post-traumatic stress disorder)   . PTSD (post-traumatic stress disorder)   . TMJ (dislocation of temporomandibular joint)   . Vertigo     Patient Active Problem List   Diagnosis Date Noted  . Grief reaction 05/02/2016  . Pelvic pain 04/16/2016  . Migraine 02/10/2016  . Chest pain on breathing 01/24/2016  . Abnormal uterine bleeding (AUB) 01/13/2016  . Dysmenorrhea 01/13/2016  . H/O tubal ligation 01/13/2016  . Malaise 10/19/2015  . Lipoma of back 08/23/2015  . Gingivitis 08/19/2015  . Encounter for smoking cessation counseling 08/19/2015  . Paronychia 11/10/2014  . Neck pain 10/08/2014  . Benign paroxysmal positional vertigo 02/14/2013  . 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 Visual field defect 06/23/2012  . Menorrhagia 10/28/2010  . PTSD 03/02/2010  . Tobacco use disorder 02/07/2008  .  PAP SMEAR, LGSIL, ABNORMAL 11/27/2006  . 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 ADENOIDECTOMY, TONSILLECTOMY AND MYRINGOTOMY WITH TUBE PLACEMENT  1990's  . CESAREAN SECTION  06/20/2012   Procedure: CESAREAN SECTION;  Surgeon: Emily Filbert, MD;  Location: [facility];  Service: Obstetrics;  Laterality: N/A;  . LAPAROSCOPIC APPENDECTOMY  07/25/2012   Procedure: APPENDECTOMY LAPAROSCOPIC;  Surgeon: Zenovia Jarred, MD;  Location: [facility];  Service: General;  Laterality: N/A;  . LAPAROSCOPIC TUBAL LIGATION Bilateral 08/04/2014   Procedure: BILATERAL LAPAROSCOPIC TUBAL LIGATION;  Surgeon: Woodroe Mode, MD;  Location: [facility];  Service: Gynecology;  Laterality: Bilateral;  . LIPOMA EXCISION N/A 09/16/2015   Procedure: EXCISION LIPOMA LUMBAR REGION;  Surgeon: Donnie Mesa, MD;  Location: [facility];  Service: General;  Laterality: N/A;  . WISDOM TOOTH EXTRACTION      OB History    Gravida Para Term Preterm AB Living   3 3 2 1  0 3   SAB TAB Ectopic Multiple Live Births   0 0 0 0 1      Obstetric Comments   C-Section Indication: Non-reassuring fetal tracing, growth delay & marked proteinuria & Pre-Eclampsia Eclamptic Seizure during C-section delivery       Home Medications    Prior to Admission medications  Medication Sig Start Date End Date Taking? Authorizing Provider  butalbital-acetaminophen-caffeine (FIORICET) 50-325-40 MG tablet Take 1-2 tablets by mouth every 6 (six) hours as needed for headache. 02/10/16   Morene Crocker, CNM  dexamethasone (DECADRON) 6 MG tablet  01/14/16   Historical Provider, MD  diphenhydrAMINE (BENADRYL) 25 mg capsule TK 2 CS PO Q 6 H PRN 01/13/16   Historical Provider, MD  gabapentin (NEURONTIN) 300 MG capsule Take 300 mg by mouth 3 (three) times daily.    Historical Provider, MD  metoCLOPramide (REGLAN) 10 MG tablet Take 1 tablet (10 mg total) by mouth every 6 (six) hours as needed  for nausea. 02/25/16   Seabron Spates, CNM  nortriptyline (PAMELOR) 25 MG capsule Take 2 capsules at night 04/28/16   Cameron Sprang, MD  pantoprazole (PROTONIX) 40 MG tablet Take 2 tablets (80 mg total) by mouth daily. 06/26/16   Sela Hua, MD  Prenatal Vit-Fe Phos-FA-Omega (VITAFOL GUMMIES) 3.33-0.333-34.8 MG CHEW Chew 3 tablets by mouth daily. 01/13/16   Rachelle A Denney, CNM  PROAIR HFA 108 (90 Base) MCG/ACT inhaler INHALE 2 PUFFS INTO THE LUNGS EVERY 4 HOURS AS NEEDED FOR WHEEZING OR SHORTNESS OF BREATH 05/15/16   Sela Hua, MD  traMADol (ULTRAM) 50 MG tablet Take 1 tablet (50 mg total) by mouth every 6 (six) hours as needed. 04/25/16 04/25/17  Sela Hua, MD  traZODone (DESYREL) 50 MG tablet Take 1 tablet (50 mg total) by mouth at bedtime as needed for sleep. 06/16/16   Sela Hua, MD    Family History Family History  Problem Relation Age of Onset  . Breast cancer Paternal Grandmother   . Colon cancer Paternal Grandfather   . Colon cancer Maternal Grandfather   . Colon polyps Maternal Aunt   . Diabetes Mother   . Anesthesia problems Mother     hard to wake up post-op  . Diabetes Sister   . Anesthesia problems Sister     hard to wake up post-op  . Diabetes Father   . Heart disease Father     Social History Social History  Substance Use Topics  . Smoking status: Current Every Day Smoker    Packs/day: 1.00    Years: 14.00    Types: Cigarettes  . Smokeless tobacco: Current User     Comment: less than 1 pack/day  . Alcohol use No     Allergies   Codeine   Review of Systems Review of Systems   Physical Exam Triage Vital Signs ED Triage Vitals  Enc Vitals Group     BP      Pulse      Resp      Temp      Temp src      SpO2      Weight      Height      Head Circumference      Peak Flow      Pain Score      Pain Loc      Pain Edu?      Excl. in [place]?    No data found.   Updated Vital Signs LMP 06/18/2016   Visual Acuity Right Eye  Distance:   Left Eye Distance:   Bilateral Distance:    Right Eye Near:   Left Eye Near:    Bilateral Near:     Physical Exam eomi ncat Alert pleasant oriented in nad Neck soft supple, TM's bilateralyl have wax but no  fluid s1 s 2 tachcyardic abd soft nt nd no reboudn no guard No le edema ROM intact moving 4 limbs equally   UC Treatments / Results  Labs (all labs ordered are listed, but only abnormal results are displayed) Labs Reviewed - No data to display  EKG  EKG Interpretation None       Radiology No results found.  Procedures Procedures (including critical care time)  Medications Ordered in UC Medications - No data to display   Initial Impression / Assessment and Plan / UC Course  I have reviewed the triage vital signs and the nursing notes.  Pertinent labs & imaging results that were available during my care of the patient were reviewed by me and considered in my medical decision making (see chart for details).  Clinical Course     5:35 PM given h/o of prior PNa would get CXR as patient states that this is how she presented in the past   5:58 PM CXR my read shows no specific indication of PNA She states the heaviness is still there She has no overt SOB States that she has had GERd ion the past and take sPantoprazole for this which helps I do not think that she has APE.  She is in no distress currently I have advised her if this perssits she might benefit form CT scan of the chest  Her WELL's score is slightly elevated  I have discussed with her that my concerns are we shodl rule out PE She is on Birth control, smokes and did recently in the past 2 weeks travel by car over 4 hrs  She is agreeable I have signed out her care to Ms Alroy Dust, NP   Final Clinical Impressions(s) / UC Diagnoses   Final diagnoses:  None    New Prescriptions New Prescriptions   No medications on file     Nita Sells, MD 06/29/16 1805      Nita Sells, MD 06/29/16 1807

## 2016-06-29 NOTE — ED Provider Notes (Signed)
CSN: [id number]     Arrival date & time 06/29/16  1653 History   First MD Initiated Contact with Patient 06/29/16 1829     Chief Complaint  Patient presents with  . Shortness of Breath   (Consider location/radiation/quality/duration/timing/severity/associated sxs/prior Treatment) Pt has cp sob for 4 days now. Taking over this pt from md and x ray was neg. Ddimer top be completed to ensure neg due to pt does smoke and is on birth control.       Past Medical History:  Diagnosis Date  . Anxiety   . Asthma   . Bipolar disorder ([place])    no current med.  . Depression    no current med.  . Family history of adverse reaction to anesthesia    states mother and sister are hard to wake up post-op  . Gestational diabetes   . History of seizure 06/20/2012   x 1 - during delivery of child(eclampsia)  . Hypertension   . Lipoma of lower back 08/2015  . Migraine   . PTSD (post-traumatic stress disorder)   . PTSD (post-traumatic stress disorder)   . TMJ (dislocation of temporomandibular joint)   . Vertigo    Past Surgical History:  Procedure Laterality Date  . ADENOIDECTOMY, TONSILLECTOMY AND MYRINGOTOMY WITH TUBE PLACEMENT  1990's  . CESAREAN SECTION  06/20/2012   Procedure: CESAREAN SECTION;  Surgeon: Emily Filbert, MD;  Location: [facility];  Service: Obstetrics;  Laterality: N/A;  . LAPAROSCOPIC APPENDECTOMY  07/25/2012   Procedure: APPENDECTOMY LAPAROSCOPIC;  Surgeon: Zenovia Jarred, MD;  Location: [facility];  Service: General;  Laterality: N/A;  . LAPAROSCOPIC TUBAL LIGATION Bilateral 08/04/2014   Procedure: BILATERAL LAPAROSCOPIC TUBAL LIGATION;  Surgeon: Woodroe Mode, MD;  Location: [facility];  Service: Gynecology;  Laterality: Bilateral;  . LIPOMA EXCISION N/A 09/16/2015   Procedure: EXCISION LIPOMA LUMBAR REGION;  Surgeon: Donnie Mesa, MD;  Location: [facility];  Service: General;  Laterality: N/A;  . WISDOM TOOTH EXTRACTION     Family History  Problem Relation Age of Onset   . Breast cancer Paternal Grandmother   . Colon cancer Paternal Grandfather   . Colon cancer Maternal Grandfather   . Colon polyps Maternal Aunt   . Diabetes Mother   . Anesthesia problems Mother     hard to wake up post-op  . Diabetes Sister   . Anesthesia problems Sister     hard to wake up post-op  . Diabetes Father   . Heart disease Father    Social History  Substance Use Topics  . Smoking status: Current Every Day Smoker    Packs/day: 1.00    Years: 14.00    Types: Cigarettes  . Smokeless tobacco: Current User     Comment: less than 1 pack/day  . Alcohol use No   OB History    Gravida Para Term Preterm AB Living   3 3 2 1  0 3   SAB TAB Ectopic Multiple Live Births   0 0 0 0 1      Obstetric Comments   C-Section Indication: Non-reassuring fetal tracing, growth delay & marked proteinuria & Pre-Eclampsia Eclamptic Seizure during C-section delivery     Review of Systems  Constitutional: Negative.   Respiratory: Positive for shortness of breath.   Cardiovascular: Positive for chest pain.  Gastrointestinal: Negative.   Musculoskeletal: Negative.   Skin: Negative.   Neurological: Negative.     Allergies  Codeine  Home Medications   Prior to  Admission medications   Medication Sig Start Date End Date Taking? Authorizing Provider  dexamethasone (DECADRON) 6 MG tablet  01/14/16  Yes Historical Provider, MD  diphenhydrAMINE (BENADRYL) 25 mg capsule TK 2 CS PO Q 6 H PRN 01/13/16  Yes Historical Provider, MD  gabapentin (NEURONTIN) 300 MG capsule Take 300 mg by mouth 3 (three) times daily.   Yes Historical Provider, MD  metoCLOPramide (REGLAN) 10 MG tablet Take 1 tablet (10 mg total) by mouth every 6 (six) hours as needed for nausea. 02/25/16  Yes Seabron Spates, CNM  nortriptyline (PAMELOR) 25 MG capsule Take 2 capsules at night 04/28/16  Yes Cameron Sprang, MD  pantoprazole (PROTONIX) 40 MG tablet Take 2 tablets (80 mg total) by mouth daily. 06/26/16  Yes Sela Hua, MD  Prenatal Vit-Fe Phos-FA-Omega (VITAFOL GUMMIES) 3.33-0.333-34.8 MG CHEW Chew 3 tablets by mouth daily. 01/13/16  Yes Rachelle A Denney, CNM  PROAIR HFA 108 (90 Base) MCG/ACT inhaler INHALE 2 PUFFS INTO THE LUNGS EVERY 4 HOURS AS NEEDED FOR WHEEZING OR SHORTNESS OF BREATH 05/15/16  Yes Sela Hua, MD  traMADol (ULTRAM) 50 MG tablet Take 1 tablet (50 mg total) by mouth every 6 (six) hours as needed. 04/25/16 04/25/17 Yes Sela Hua, MD  traZODone (DESYREL) 50 MG tablet Take 1 tablet (50 mg total) by mouth at bedtime as needed for sleep. 06/16/16  Yes Sela Hua, MD  butalbital-acetaminophen-caffeine (FIORICET) [phone] MG tablet Take 1-2 tablets by mouth every 6 (six) hours as needed for headache. 02/10/16   Morene Crocker, CNM   Meds Ordered and Administered this Visit  Medications - No data to display  BP 99/70 (BP Location: Right Arm)   Pulse 119   Temp 98.4 F (36.9 C) (Oral)   Resp 14   LMP 06/18/2016   SpO2 97%  No data found.   Physical Exam  Constitutional: She appears well-developed.  Eyes: Pupils are equal, round, and reactive to light.  Neck: Normal range of motion.  Cardiovascular: Normal rate and regular rhythm.   Pulmonary/Chest: Breath sounds normal.  C/o sob talking in full sentences.  sitting not in distress   Musculoskeletal: Normal range of motion.  Neurological: She is alert.  Skin: Skin is warm and dry. Capillary refill takes less than 2 seconds.    Urgent Care Course   Clinical Course     Procedures (including critical care time)  Labs Review Labs Reviewed  D-DIMER, QUANTITATIVE (NOT AT [facility])    Imaging Review Dg Chest 2 View  Result Date: 06/29/2016 CLINICAL DATA:  Evaluate for pneumonia.  Shortness of breath. EXAM: CHEST  2 VIEW COMPARISON:  Chest radiograph 05/03/2016. FINDINGS: The heart size and mediastinal contours are within normal limits. Both lungs are clear. The visualized skeletal structures are unremarkable.  IMPRESSION: No active cardiopulmonary disease. Electronically Signed   By: Lovey Newcomer M.D.   On: 06/29/2016 17:56             MDM   1. SOB (shortness of breath)   2. Chest pain of unknown etiology   Discussed the results of all test Explained that we have neg results and that pt will need to follow up with pcp may need to see cardiology if she continues to have chest pain Also expressed that pt does need to stop smoking to help with this. May use albuterol inh as needed to help Pt will need to go to the ER if further chest pain or sob.  Melanee Left, NP 06/29/16 [phone]

## 2016-06-29 NOTE — ED Triage Notes (Signed)
Here for SOB onset yest associated w/HA and CP  Reports she dexamethasone w/benadryl w/temp relief.   Smokes 0.5 PPD  A&O x4... NAD... Talking in complete sentences.

## 2016-07-12 ENCOUNTER — Ambulatory Visit: Payer: Medicaid Other | Admitting: Internal Medicine

## 2016-07-12 ENCOUNTER — Ambulatory Visit (INDEPENDENT_AMBULATORY_CARE_PROVIDER_SITE_OTHER): Payer: Medicaid Other | Admitting: *Deleted

## 2016-07-12 ENCOUNTER — Other Ambulatory Visit: Payer: Self-pay | Admitting: *Deleted

## 2016-07-12 DIAGNOSIS — S93602D Unspecified sprain of left foot, subsequent encounter: Secondary | ICD-10-CM | POA: Diagnosis not present

## 2016-07-12 DIAGNOSIS — Z23 Encounter for immunization: Secondary | ICD-10-CM

## 2016-07-12 MED ORDER — TRAZODONE HCL 50 MG PO TABS: 50.0000 mg | ORAL_TABLET | Freq: Every evening | ORAL | 0 refills | Status: DC | PRN

## 2016-07-12 MED ORDER — ALBUTEROL SULFATE HFA 108 (90 BASE) MCG/ACT IN AERS: INHALATION_SPRAY | RESPIRATORY_TRACT | 0 refills | Status: DC

## 2016-07-28 ENCOUNTER — Emergency Department (HOSPITAL_COMMUNITY)
Admission: EM | Admit: 2016-07-28 | Discharge: 2016-07-29 | Disposition: A | Discharge status: Home / Self Care | Payer: Medicaid Other | Attending: Emergency Medicine | Admitting: Emergency Medicine

## 2016-07-28 ENCOUNTER — Encounter (HOSPITAL_COMMUNITY): Payer: Self-pay | Admitting: Emergency Medicine

## 2016-07-28 ENCOUNTER — Emergency Department (HOSPITAL_COMMUNITY): Payer: Medicaid Other

## 2016-07-28 ENCOUNTER — Ambulatory Visit: Payer: Medicaid Other | Admitting: Neurology

## 2016-07-28 DIAGNOSIS — Z79899 Other long term (current) drug therapy: Secondary | ICD-10-CM | POA: Insufficient documentation

## 2016-07-28 DIAGNOSIS — N3000 Acute cystitis without hematuria: Secondary | ICD-10-CM | POA: Diagnosis not present

## 2016-07-28 DIAGNOSIS — R109 Unspecified abdominal pain: Secondary | ICD-10-CM

## 2016-07-28 DIAGNOSIS — I10 Essential (primary) hypertension: Secondary | ICD-10-CM | POA: Insufficient documentation

## 2016-07-28 DIAGNOSIS — R1011 Right upper quadrant pain: Secondary | ICD-10-CM | POA: Diagnosis not present

## 2016-07-28 DIAGNOSIS — F17210 Nicotine dependence, cigarettes, uncomplicated: Secondary | ICD-10-CM | POA: Diagnosis not present

## 2016-07-28 DIAGNOSIS — J45909 Unspecified asthma, uncomplicated: Secondary | ICD-10-CM | POA: Insufficient documentation

## 2016-07-28 LAB — CBC WITH DIFFERENTIAL/PLATELET
Basophils Absolute: 0 10*3/uL (ref 0.0–0.1)
Basophils Relative: 0 %
Eosinophils Absolute: 0 10*3/uL (ref 0.0–0.7)
Eosinophils Relative: 0 %
HCT: 38.8 % (ref 36.0–46.0)
Hemoglobin: 13.4 g/dL (ref 12.0–15.0)
Lymphocytes Relative: 18 %
Lymphs Abs: 3.8 10*3/uL (ref 0.7–4.0)
MCH: 31.2 pg (ref 26.0–34.0)
MCHC: 34.5 g/dL (ref 30.0–36.0)
MCV: 90.2 fL (ref 78.0–100.0)
Monocytes Absolute: 1.7 10*3/uL — ABNORMAL HIGH (ref 0.1–1.0)
Monocytes Relative: 8 %
Neutro Abs: 15.6 10*3/uL — ABNORMAL HIGH (ref 1.7–7.7)
Neutrophils Relative %: 74 %
Platelets: 366 10*3/uL (ref 150–400)
RBC: 4.3 MIL/uL (ref 3.87–5.11)
RDW: 12.4 % (ref 11.5–15.5)
WBC: 21.1 10*3/uL — ABNORMAL HIGH (ref 4.0–10.5)

## 2016-07-28 LAB — COMPREHENSIVE METABOLIC PANEL
ALT: 23 U/L (ref 14–54)
AST: 33 U/L (ref 15–41)
Albumin: 3.6 g/dL (ref 3.5–5.0)
Alkaline Phosphatase: 83 U/L (ref 38–126)
Anion gap: 12 (ref 5–15)
BUN: <5 mg/dL — ABNORMAL LOW (ref 6–20)
CO2: 23 mmol/L (ref 22–32)
Calcium: 9.2 mg/dL (ref 8.9–10.3)
Chloride: 100 mmol/L — ABNORMAL LOW (ref 101–111)
Creatinine, Ser: 0.77 mg/dL (ref 0.44–1.00)
GFR calc Af Amer: >60 mL/min (ref >60)
GFR calc non Af Amer: >60 mL/min (ref >60)
Glucose, Bld: 106 mg/dL — ABNORMAL HIGH (ref 65–99)
Potassium: 4 mmol/L (ref 3.5–5.1)
Sodium: 135 mmol/L (ref 135–145)
Total Bilirubin: 0.9 mg/dL (ref 0.3–1.2)
Total Protein: 7.1 g/dL (ref 6.5–8.1)

## 2016-07-28 LAB — I-STAT BETA HCG BLOOD, ED (MC, WL, AP ONLY): I-stat hCG, quantitative: <5 m[IU]/mL (ref <5)

## 2016-07-28 LAB — URINALYSIS, ROUTINE W REFLEX MICROSCOPIC
Bilirubin Urine: NEGATIVE
Glucose, UA: NEGATIVE mg/dL
Ketones, ur: NEGATIVE mg/dL
Nitrite: POSITIVE — AB
Protein, ur: NEGATIVE mg/dL
Specific Gravity, Urine: 1.003 — ABNORMAL LOW (ref 1.005–1.030)
pH: 8 (ref 5.0–8.0)

## 2016-07-28 LAB — I-STAT CG4 LACTIC ACID, ED: Lactic Acid, Venous: 1.41 mmol/L (ref 0.5–1.9)

## 2016-07-28 MED ORDER — FENTANYL CITRATE (PF) 100 MCG/2ML IJ SOLN
50.0000 ug | Freq: Once | INTRAMUSCULAR | Status: AC
  Administered 2016-07-28: 50 ug via INTRAVENOUS
  Filled 2016-07-28: qty 2

## 2016-07-28 MED ORDER — IOPAMIDOL (ISOVUE-300) INJECTION 61%
INTRAVENOUS | Status: AC
  Administered 2016-07-28: 100 mL
  Filled 2016-07-28: qty 100

## 2016-07-28 MED ORDER — ONDANSETRON HCL 4 MG/2ML IJ SOLN
4.0000 mg | Freq: Once | INTRAMUSCULAR | Status: AC
  Administered 2016-07-28: 4 mg via INTRAVENOUS
  Filled 2016-07-28: qty 2

## 2016-07-28 MED ORDER — HYDROMORPHONE HCL 2 MG/ML IJ SOLN
1.0000 mg | Freq: Once | INTRAMUSCULAR | Status: AC
  Administered 2016-07-28: 1 mg via INTRAVENOUS
  Filled 2016-07-28: qty 1

## 2016-07-28 NOTE — ED Notes (Signed)
Pt transported to CT ?

## 2016-07-28 NOTE — ED Notes (Signed)
edp notified of patient condition

## 2016-07-28 NOTE — ED Provider Notes (Signed)
[facility] DEPT Provider Note   CSN: [id number] Arrival date & time: 07/28/16  1948     History   Chief Complaint Chief Complaint  Patient presents with  . Flank Pain    HPI Michelle [name] is a 26 y.o. female.  The history is provided by the patient.  Flank Pain  This is a new problem. The current episode started yesterday. The problem occurs constantly. The problem has been gradually worsening. Associated symptoms include abdominal pain. Pertinent negatives include no chest pain, no headaches and no shortness of breath. Nothing aggravates the symptoms. The symptoms are relieved by lying down. She has tried nothing for the symptoms.  Denies fevers, dysuria, N/V/D, Cp, SOB.    Past Medical History:  Diagnosis Date  . Anxiety   . Asthma   . Bipolar disorder ([place])    no current med.  . Depression    no current med.  . Family history of adverse reaction to anesthesia    states mother and sister are hard to wake up post-op  . Gestational diabetes   . History of seizure 06/20/2012   x 1 - during delivery of child(eclampsia)  . Hypertension   . Lipoma of lower back 08/2015  . Migraine   . PTSD (post-traumatic stress disorder)   . PTSD (post-traumatic stress disorder)   . TMJ (dislocation of temporomandibular joint)   . Vertigo     Patient Active Problem List   Diagnosis Date Noted  . Grief reaction 05/02/2016  . Pelvic pain 04/16/2016  . Migraine 02/10/2016  . Chest pain on breathing 01/24/2016  . Abnormal uterine bleeding (AUB) 01/13/2016  . Dysmenorrhea 01/13/2016  . H/O tubal ligation 01/13/2016  . Malaise 10/19/2015  . Lipoma of back 08/23/2015  . Gingivitis 08/19/2015  . Encounter for smoking cessation counseling 08/19/2015  . Paronychia 11/10/2014  . Neck pain 10/08/2014  . Benign paroxysmal positional vertigo 02/14/2013  . 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 Visual field defect 06/23/2012  . Menorrhagia 10/28/2010  . PTSD 03/02/2010  . Tobacco use  disorder 02/07/2008  . PAP SMEAR, LGSIL, ABNORMAL 11/27/2006  . 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 ADENOIDECTOMY, TONSILLECTOMY AND MYRINGOTOMY WITH TUBE PLACEMENT  1990's  . CESAREAN SECTION  06/20/2012   Procedure: CESAREAN SECTION;  Surgeon: Emily Filbert, MD;  Location: [facility];  Service: Obstetrics;  Laterality: N/A;  . LAPAROSCOPIC APPENDECTOMY  07/25/2012   Procedure: APPENDECTOMY LAPAROSCOPIC;  Surgeon: Zenovia Jarred, MD;  Location: [facility];  Service: General;  Laterality: N/A;  . LAPAROSCOPIC TUBAL LIGATION Bilateral 08/04/2014   Procedure: BILATERAL LAPAROSCOPIC TUBAL LIGATION;  Surgeon: Woodroe Mode, MD;  Location: [facility];  Service: Gynecology;  Laterality: Bilateral;  . LIPOMA EXCISION N/A 09/16/2015   Procedure: EXCISION LIPOMA LUMBAR REGION;  Surgeon: Donnie Mesa, MD;  Location: [facility];  Service: General;  Laterality: N/A;  . WISDOM TOOTH EXTRACTION      OB History    Gravida Para Term Preterm AB Living   3 3 2 1  0 3   SAB TAB Ectopic Multiple Live Births   0 0 0 0 1      Obstetric Comments   C-Section Indication: Non-reassuring fetal tracing, growth delay & marked proteinuria & Pre-Eclampsia Eclamptic Seizure during C-section delivery       Home Medications    Prior to Admission medications   Medication Sig Start Date End Date  Taking? Authorizing Provider  albuterol (PROAIR HFA) 108 (90 Base) MCG/ACT inhaler INHALE 2 PUFFS INTO THE LUNGS EVERY 4 HOURS AS NEEDED FOR WHEEZING OR SHORTNESS OF BREATH 07/12/16  Yes Sela Hua, MD  diphenhydrAMINE (BENADRYL) 25 mg capsule TK 2 CS PO Q 6 H PRN 01/13/16  Yes Historical Provider, MD  gabapentin (NEURONTIN) 300 MG capsule Take 600 mg by mouth at bedtime.    Yes Historical Provider, MD  nortriptyline (PAMELOR) 25 MG capsule Take 2 capsules at night 04/28/16  Yes Cameron Sprang, MD  pantoprazole (PROTONIX) 40 MG tablet Take 2 tablets  (80 mg total) by mouth daily. 06/26/16  Yes Sela Hua, MD  Prenatal Vit-Fe Phos-FA-Omega (VITAFOL GUMMIES) 3.33-0.333-34.8 MG CHEW Chew 3 tablets by mouth daily. 01/13/16  Yes Rachelle A Denney, CNM  traMADol (ULTRAM) 50 MG tablet Take 1 tablet (50 mg total) by mouth every 6 (six) hours as needed. 04/25/16 04/25/17 Yes Sela Hua, MD  traZODone (DESYREL) 50 MG tablet Take 1 tablet (50 mg total) by mouth at bedtime as needed for sleep. 07/12/16  Yes Sela Hua, MD  butalbital-acetaminophen-caffeine (FIORICET) [phone] MG tablet Take 1-2 tablets by mouth every 6 (six) hours as needed for headache. Patient not taking: Reported on 07/28/2016 02/10/16   Rachelle A Denney, CNM  cephALEXin (KEFLEX) 500 MG capsule Take 1 capsule (500 mg total) by mouth 2 (two) times daily. 07/29/16 08/03/16  Olena Willy Mali Kabe Mckoy, MD  dexamethasone (DECADRON) 6 MG tablet  01/14/16   Historical Provider, MD  metoCLOPramide (REGLAN) 10 MG tablet Take 1 tablet (10 mg total) by mouth every 6 (six) hours as needed for nausea. Patient not taking: Reported on 07/28/2016 02/25/16   Seabron Spates, CNM    Family History Family History  Problem Relation Age of Onset  . Breast cancer Paternal Grandmother   . Colon cancer Paternal Grandfather   . Colon cancer Maternal Grandfather   . Colon polyps Maternal Aunt   . Diabetes Mother   . Anesthesia problems Mother     hard to wake up post-op  . Diabetes Sister   . Anesthesia problems Sister     hard to wake up post-op  . Diabetes Father   . Heart disease Father     Social History Social History  Substance Use Topics  . Smoking status: Current Every Day Smoker    Packs/day: 1.00    Years: 14.00    Types: Cigarettes  . Smokeless tobacco: Current User     Comment: less than 1 pack/day  . Alcohol use No     Allergies   Codeine   Review of Systems Review of Systems  Constitutional: Positive for fatigue. Negative for chills and fever.  HENT: Negative.     Respiratory: Negative for shortness of breath.   Cardiovascular: Negative for chest pain and palpitations.  Gastrointestinal: Positive for abdominal pain. Negative for diarrhea, nausea and vomiting.  Genitourinary: Positive for flank pain.  Skin: Negative.   Neurological: Negative for syncope, weakness, light-headedness and headaches.     Physical Exam Updated Vital Signs BP 93/75   Pulse (!) 122   Temp 98 F (36.7 C) (Oral)   Resp 21   LMP 07/22/2016   SpO2 93%   Physical Exam  Constitutional: She appears well-developed and well-nourished. No distress.  HENT:  Head: Normocephalic and atraumatic.  Eyes: Conjunctivae are normal.  Neck: Neck supple.  Cardiovascular: Normal rate and regular rhythm.   No murmur heard. Pulmonary/Chest: Effort  normal and breath sounds normal. No respiratory distress.  Abdominal: Soft. She exhibits no distension. There is tenderness in the right upper quadrant. There is CVA tenderness (Right). There is no rigidity, no rebound, no guarding, no tenderness at McBurney's point and negative Murphy's sign.  Musculoskeletal: She exhibits no edema.  Neurological: She is alert.  Skin: Skin is warm and dry.  Psychiatric: She has a normal mood and affect.  Nursing note and vitals reviewed.    ED Treatments / Results  Labs (all labs ordered are listed, but only abnormal results are displayed) Labs Reviewed  COMPREHENSIVE METABOLIC PANEL - Abnormal; Notable for the following:       Result Value   Chloride 100 (*)    Glucose, Bld 106 (*)    BUN <5 (*)    All other components within normal limits  CBC WITH DIFFERENTIAL/PLATELET - Abnormal; Notable for the following:    WBC 21.1 (*)    Neutro Abs 15.6 (*)    Monocytes Absolute 1.7 (*)    All other components within normal limits  URINALYSIS, ROUTINE W REFLEX MICROSCOPIC - Abnormal; Notable for the following:    Specific Gravity, Urine 1.003 (*)    Hgb urine dipstick SMALL (*)    Nitrite POSITIVE (*)     Leukocytes, UA MODERATE (*)    Bacteria, UA MANY (*)    Squamous Epithelial / LPF 0-5 (*)    All other components within normal limits  CULTURE, BLOOD (ROUTINE X 2)  CULTURE, BLOOD (ROUTINE X 2)  URINE CULTURE  I-STAT CG4 LACTIC ACID, ED  I-STAT BETA HCG BLOOD, ED ([facility], [facility], [facility] ONLY)  I-STAT CG4 LACTIC ACID, ED    EKG  EKG Interpretation None       Radiology Ct Abdomen Pelvis W Contrast  Result Date: 07/28/2016 CLINICAL DATA:  Right flank pain and nausea for 2 days. Pain radiates to the back. Previous appendectomy. EXAM: CT ABDOMEN AND PELVIS WITH CONTRAST TECHNIQUE: Multidetector CT imaging of the abdomen and pelvis was performed using the standard protocol following bolus administration of intravenous contrast. CONTRAST:  100 ml ISOVUE-300 IOPAMIDOL (ISOVUE-300) INJECTION 61% COMPARISON:  01/23/2016 FINDINGS: Lower chest: Atelectasis in the lung bases. Hepatobiliary: Mild diffuse fatty infiltration of the liver. Gallbladder and bile ducts are unremarkable. Pancreas: Unremarkable. No pancreatic ductal dilatation or surrounding inflammatory changes. Spleen: Normal in size without focal abnormality. Adrenals/Urinary Tract: Adrenal glands are unremarkable. Kidneys are normal, without renal calculi, focal lesion, or hydronephrosis. Bladder is unremarkable. Stomach/Bowel: Stomach, small bowel, and colon are not abnormally distended. No wall thickening identified. Appendix is surgically absent. Vascular/Lymphatic: No significant vascular findings are present. No enlarged abdominal or pelvic lymph nodes. Reproductive: Uterus and ovaries are not enlarged. Central low attenuation in the right ovary probably represents a cyst. Previous hemorrhagic cyst in the left ovary has resolved in the interval. No free pelvic fluid. Previous tubal ligations. Other: No free air or free fluid in the abdomen. Small umbilical hernia containing fat. Infiltration in the subcutaneous fat over the gluteal region likely  representing an injection site. Musculoskeletal: No acute or significant osseous findings. IMPRESSION: No acute process demonstrated in the abdomen or pelvis. No evidence of bowel obstruction or inflammation. Mild diffuse fatty infiltration of the liver. Electronically Signed   By: Lucienne Capers M.D.   On: 07/28/2016 23:44    Procedures Procedures (including critical care time)  Medications Ordered in ED Medications  fentaNYL (SUBLIMAZE) injection 50 mcg (50 mcg Intravenous Given 07/28/16 2151)  ondansetron [facility]) injection 4 mg (4 mg Intravenous Given 07/28/16 2151)  HYDROmorphone (DILAUDID) injection 1 mg (1 mg Intravenous Given 07/28/16 2301)  iopamidol (ISOVUE-300) 61 % injection (100 mLs  Contrast Given 07/28/16 2321)     Initial Impression / Assessment and Plan / ED Course  I have reviewed the triage vital signs and the nursing notes.  Pertinent labs & imaging results that were available during my care of the patient were reviewed by me and considered in my medical decision making (see chart for details).  Clinical Course    26 year old female with presenting with 2 days of right flank pain. In triage she was noted to be febrile to 100.8 and tachycardic. Fluid bolus given. Exam notable to R CVAT with mild RUQ TTP. No signs of gaurding, or peritonitis.  CBC, CMP, Ua, Ucx, and lactic acid sent. Hx of appendectomy.  Concern for pyleo vs kidney stone. Labs notable for UA with positive nitrite, mod leukocytes, many bacteria. Lactic acid 1.4. CMP unremarkable with no AKI. CBC with leukocytosis of 21 and Hgb 13.4 CT abd pelvis with no acute process or signs of pylenophritis or nephrolithiasis. Gallbladder unremarkable with no murpheys sign on exam.  Pt likely with UTI. Will tx with keflex and close f/u with PCP. Pain significantly improved at time of discharge. Pt tachycardic on discharge however pt already received fluids and well appearing on exam. Pain much improved. Never had chest  pain or SOB.  Pt amenable to this plan and strict return precautions given to return for worsening fever, worsening pain, n/v or any other concerning symptoms.   Final Clinical Impressions(s) / ED Diagnoses   Final diagnoses:  Right flank pain  Acute cystitis without hematuria    New Prescriptions New Prescriptions   CEPHALEXIN (KEFLEX) 500 MG CAPSULE    Take 1 capsule (500 mg total) by mouth 2 (two) times daily.     Goro Wenrick Mali Rema Lievanos, MD 07/29/16 0124    Cashe Gatt Mali Konner Saiz, MD 07/29/16 [place], MD 07/29/16 [id number]

## 2016-07-28 NOTE — ED Provider Notes (Signed)
Complains of right-sided upper abdominal pain radiating to right flank onset yesterday accompanied by nausea no vomiting. Pain is not affected by eating.. On exam alert nontoxic. Abdomen obese, normal active bowel sounds, minimally tender at right upper quadrant. Negative Murphy sign. Positive right flank tenderness.   Michelle Dakin, MD 07/28/16 [phone]

## 2016-07-28 NOTE — ED Triage Notes (Signed)
Patient with flank pain on the right, radiating to her lower back.  Patient denies any pain with urination.  Patient does have some nausea, no vomiting with the pain.  She states that she has had chills.

## 2016-07-29 MED ORDER — CEPHALEXIN 500 MG PO CAPS: 500.0000 mg | ORAL_CAPSULE | Freq: Two times a day (BID) | ORAL | 0 refills | Status: AC

## 2016-07-31 LAB — URINE CULTURE: Culture: >=100000 — AB

## 2016-08-01 ENCOUNTER — Telehealth (HOSPITAL_BASED_OUTPATIENT_CLINIC_OR_DEPARTMENT_OTHER): Payer: Self-pay | Admitting: *Deleted

## 2016-08-01 NOTE — Telephone Encounter (Signed)
Post ED Visit - Positive Culture Follow-up  Culture report reviewed by antimicrobial stewardship pharmacist:  []  Michelle [name], Pharm.D. []  Michelle [name], Pharm.D., BCPS []  Michelle [name], Pharm.D. []  Michelle [name], Pharm.D., BCPS []  Michelle [name], [place].D., BCPS, AAHIVP []  Michelle [name], Pharm.D., BCPS, AAHIVP []  Michelle [name], Pharm.D. []  Michelle [name], Pharm.D. Michelle [name], Pharm D  Positive urine culture Treated with Cephalexin, organism sensitive to the same and no further patient follow-up is required at this time.  Michelle [name] [facility] 08/01/2016, 9:56 AM

## 2016-08-02 LAB — CULTURE, BLOOD (ROUTINE X 2): Culture: NO GROWTH

## 2016-08-03 LAB — CULTURE, BLOOD (ROUTINE X 2): Culture: NO GROWTH

## 2016-08-10 ENCOUNTER — Encounter: Payer: Self-pay | Admitting: Neurology

## 2016-08-14 ENCOUNTER — Ambulatory Visit (INDEPENDENT_AMBULATORY_CARE_PROVIDER_SITE_OTHER): Payer: Medicaid Other | Admitting: Internal Medicine

## 2016-08-14 ENCOUNTER — Encounter: Payer: Self-pay | Admitting: Internal Medicine

## 2016-08-14 VITALS — BP 118/78 | HR 111 | Temp 98.6°F | Ht 62.0 in | Wt 171.0 lb

## 2016-08-14 DIAGNOSIS — S93602D Unspecified sprain of left foot, subsequent encounter: Secondary | ICD-10-CM | POA: Diagnosis not present

## 2016-08-14 DIAGNOSIS — R339 Retention of urine, unspecified: Secondary | ICD-10-CM | POA: Diagnosis not present

## 2016-08-14 DIAGNOSIS — R399 Unspecified symptoms and signs involving the genitourinary system: Secondary | ICD-10-CM | POA: Diagnosis not present

## 2016-08-14 LAB — POCT URINALYSIS DIPSTICK
Bilirubin, UA: NEGATIVE
Blood, UA: NEGATIVE
Glucose, UA: NEGATIVE
Leukocytes, UA: NEGATIVE
Nitrite, UA: NEGATIVE
Protein, UA: NEGATIVE
Spec Grav, UA: 1.01
Urobilinogen, UA: 0.2
pH, UA: 7

## 2016-08-14 NOTE — Patient Instructions (Signed)
It was so nice to see you!  Your urine studies were completely normal, so I don't think this is a urinary tract infection. I think your urine problems may be caused by constipation. I would take Miralax at least twice a day at home.   The Nortriptyline may also be contributing. I would stop taking this for a few nights to see if it helps. If you don't notice a difference, you can restart the Nortriptyline.  -Dr. Brett Albino

## 2016-08-14 NOTE — Assessment & Plan Note (Signed)
Pt has had difficulty emptying her bladder for the last 4 days. She was just treated for UTI on 07/28/16 with a 7 day course of Keflex. Urine culture from that visit grew pansensitive E. Coli. Think her difficulty emptying her bladder may be related to constipation. Pt states she has not had a bowel movement for 1 week. She is also on Nortriptyline, which can causing difficulty emptying the bladder. She has not increased this dose recently. - UA performed in clinic today was negative for infection - Advised Pt to take Miralax 2-4 capfuls per day and titrate dose until she has ~1 BM per day. - Spoke with Dr. Valentina Lucks regarding Nortriptyline as a cause of her symptoms. He recommends stopping this medication for a few nights to see if this helps her symptoms.  - Follow-up in 1-2 weeks if symptoms are not improving

## 2016-08-14 NOTE — Progress Notes (Signed)
   [facility] Phone: [phone]  Subjective:  Michelle [name] is a 26 year old female presenting to clinic for possible UTI. She was seen in the ED on 1/12 for suprapubic/RLQ pain and was diagnosed with acute cystitis. She was discharged home with Keflex x 7 days. She took the medicine as prescribed. Her abdominal pain resolved after finishing the course of antibiotics. Starting 4 days ago, she has not been able to fully empty her bladder. She also feels like her urine smells bad. She states she takes a few seconds to start her stream and then doesn't feel like her bladder is completely empty. She denies any dysuria. She endorses urinary frequency because she isn't fully emptying her bladder. She denies urinary urgency. She denies any fevers or chills. She denies vaginal discharge. She has not had a bowel movement for the last week. She normally only has BMs 1-2x/week. She is not using any over the counter medications such as allergy medicines or Benadryl. She did take 2 tablets of her Trazodone a few nights ago because she was having trouble sleeping.  ROS: See HPI for pertinent positives and negatives  Past Medical History- migraines, bipolar disorder, tobacco use  Family history reviewed for today's visit. No changes.  Social history- patient is a current smoker  Objective: BP 118/78 (BP Location: Left Arm, Patient Position: Sitting, Cuff Size: Normal)   Pulse (!) 111   Temp 98.6 F (37 C) (Oral)   Ht 5\' 2"  (1.575 m)   Wt 171 lb (77.6 kg)   LMP 08/14/2016 (Exact Date)   SpO2 98%   BMI 31.28 kg/m  Gen: NAD, alert, cooperative with exam GI: +BS, soft, mild suprapubic discomfort, no masses, no rebound, no guarding Back: No CVA tenderness  Assessment/Plan: Difficulty Urinating: Pt has had difficulty emptying her bladder for the last 4 days. She was just treated for UTI on 07/28/16 with a 7 day course of Keflex. Urine culture from that visit grew pansensitive E. Coli.  Think her difficulty emptying her bladder may be related to constipation. Pt states she has not had a bowel movement for 1 week. She is also on Nortriptyline, which can causing difficulty emptying the bladder. She has not increased this dose recently. - UA performed in clinic today was negative for infection - Advised Pt to take Miralax 2-4 capfuls per day and titrate dose until she has ~1 BM per day. - Spoke with Dr. Valentina Lucks regarding Nortriptyline as a cause of her symptoms. He recommends stopping this medication for a few nights to see if this helps her symptoms.  - Follow-up in 1-2 weeks if symptoms are not improving   Hyman Bible, MD PGY-2

## 2016-08-20 ENCOUNTER — Other Ambulatory Visit: Payer: Self-pay | Admitting: Internal Medicine

## 2016-08-25 DIAGNOSIS — N941 Unspecified dyspareunia: Secondary | ICD-10-CM | POA: Diagnosis not present

## 2016-08-25 DIAGNOSIS — N946 Dysmenorrhea, unspecified: Secondary | ICD-10-CM | POA: Diagnosis not present

## 2016-08-25 DIAGNOSIS — R102 Pelvic and perineal pain: Secondary | ICD-10-CM | POA: Diagnosis not present

## 2016-09-10 ENCOUNTER — Inpatient Hospital Stay (HOSPITAL_COMMUNITY)
Admission: AD | Admit: 2016-09-10 | Discharge: 2016-09-10 | Disposition: A | Discharge status: Home / Self Care | Payer: Medicaid Other | Source: Ambulatory Visit | Attending: Obstetrics and Gynecology | Admitting: Obstetrics and Gynecology

## 2016-09-10 ENCOUNTER — Encounter (HOSPITAL_COMMUNITY): Payer: Self-pay

## 2016-09-10 DIAGNOSIS — F4310 Post-traumatic stress disorder, unspecified: Secondary | ICD-10-CM | POA: Insufficient documentation

## 2016-09-10 DIAGNOSIS — N898 Other specified noninflammatory disorders of vagina: Secondary | ICD-10-CM | POA: Insufficient documentation

## 2016-09-10 DIAGNOSIS — Z09 Encounter for follow-up examination after completed treatment for conditions other than malignant neoplasm: Secondary | ICD-10-CM

## 2016-09-10 DIAGNOSIS — R42 Dizziness and giddiness: Secondary | ICD-10-CM | POA: Diagnosis not present

## 2016-09-10 DIAGNOSIS — F319 Bipolar disorder, unspecified: Secondary | ICD-10-CM | POA: Insufficient documentation

## 2016-09-10 DIAGNOSIS — D1779 Benign lipomatous neoplasm of other sites: Secondary | ICD-10-CM | POA: Diagnosis not present

## 2016-09-10 DIAGNOSIS — I10 Essential (primary) hypertension: Secondary | ICD-10-CM | POA: Diagnosis not present

## 2016-09-10 DIAGNOSIS — F17210 Nicotine dependence, cigarettes, uncomplicated: Secondary | ICD-10-CM | POA: Diagnosis not present

## 2016-09-10 DIAGNOSIS — Z90710 Acquired absence of both cervix and uterus: Secondary | ICD-10-CM | POA: Diagnosis not present

## 2016-09-10 DIAGNOSIS — Z9889 Other specified postprocedural states: Secondary | ICD-10-CM | POA: Insufficient documentation

## 2016-09-10 DIAGNOSIS — J45909 Unspecified asthma, uncomplicated: Secondary | ICD-10-CM | POA: Diagnosis not present

## 2016-09-10 DIAGNOSIS — Z048 Encounter for examination and observation for other specified reasons: Secondary | ICD-10-CM | POA: Diagnosis not present
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
## 2016-09-10 NOTE — MAU Note (Signed)
Urine sent to lab 

## 2016-09-21 ENCOUNTER — Telehealth: Payer: Self-pay | Admitting: Sports Medicine

## 2016-09-21 NOTE — Telephone Encounter (Signed)
Pt disconnected the call before I could ask why they were calling.  Stated they are an old patient needing to talk to Dr. Paulla Fore.  I apologize I don't have more information.

## 2016-09-22 NOTE — Telephone Encounter (Signed)
Patient called again earlier today regarding finding out more details surrounding the operative delivery of her baby in December 2013.  At that time a was a resident at the family practice center.  I did call out of respect even though I am not currently providing her care and listen to her concerns.  She was interested in the exact circumstances regarding the complicated delivery which honestly at this time I cannot fully recall that did involve an eclamptic seizure.  I recommended that if she has further questions regarding the events of her hospitalization that she reach out to our medical records department.  She did disclose to me that she was told that she had a retained placenta when she had a hysterectomy 4 weeks ago.  OB/GYN is beyond my current scope of practice as a sports medicine physician and I recommended that she discuss this further with her current provider if she has any further questions or concerns regarding the exact technical aspects of this diagnosis.

## 2016-10-15 ENCOUNTER — Other Ambulatory Visit: Payer: Self-pay | Admitting: Internal Medicine

## 2016-10-16 ENCOUNTER — Other Ambulatory Visit: Payer: Self-pay | Admitting: Internal Medicine

## 2016-10-16 ENCOUNTER — Ambulatory Visit (HOSPITAL_COMMUNITY)
Admission: EM | Admit: 2016-10-16 | Discharge: 2016-10-16 | Disposition: A | Discharge status: Home / Self Care | Payer: Medicaid Other | Attending: Emergency Medicine | Admitting: Emergency Medicine

## 2016-10-16 ENCOUNTER — Encounter (HOSPITAL_COMMUNITY): Payer: Self-pay | Admitting: Emergency Medicine

## 2016-10-16 DIAGNOSIS — H66001 Acute suppurative otitis media without spontaneous rupture of ear drum, right ear: Secondary | ICD-10-CM | POA: Diagnosis not present

## 2016-10-16 MED ORDER — CETIRIZINE-PSEUDOEPHEDRINE ER 5-120 MG PO TB12: 1.0000 | ORAL_TABLET | Freq: Every day | ORAL | 0 refills | Status: DC

## 2016-10-16 MED ORDER — AMOXICILLIN-POT CLAVULANATE 875-125 MG PO TABS: 2.0000 | ORAL_TABLET | Freq: Two times a day (BID) | ORAL | 0 refills | Status: AC

## 2016-10-16 MED ORDER — FLUTICASONE PROPIONATE 50 MCG/ACT NA SUSP: 2.0000 | Freq: Every day | NASAL | 2 refills | Status: DC

## 2016-10-16 NOTE — ED Provider Notes (Signed)
CSN: [id number]     Arrival date & time 10/16/16  1844 History   None    Chief Complaint  Patient presents with  . Otalgia   (Consider location/radiation/quality/duration/timing/severity/associated sxs/prior Treatment) 26 year old female presents to clinic with a 2 day history of ear pain states both ears hurt however the right ear hurts significantly more. She denies recent history of upper respiratory infection, however she does have congestion. She denies any tinnitus, denies any loss of hearing.   The history is provided by the patient.    Past Medical History:  Diagnosis Date  . Anxiety   . Asthma   . Bipolar disorder ([place])    no current med.  . Depression    no current med.  . Family history of adverse reaction to anesthesia    states mother and sister are hard to wake up post-op  . Gestational diabetes   . History of seizure 06/20/2012   x 1 - during delivery of child(eclampsia)  . Hypertension   . Lipoma of lower back 08/2015  . Migraine   . PTSD (post-traumatic stress disorder)   . PTSD (post-traumatic stress disorder)   . TMJ (dislocation of temporomandibular joint)   . Vertigo    Past Surgical History:  Procedure Laterality Date  . ABDOMINAL HYSTERECTOMY    . ADENOIDECTOMY, TONSILLECTOMY AND MYRINGOTOMY WITH TUBE PLACEMENT  1990's  . CESAREAN SECTION  06/20/2012   Procedure: CESAREAN SECTION;  Surgeon: Emily Filbert, MD;  Location: [facility];  Service: Obstetrics;  Laterality: N/A;  . LAPAROSCOPIC APPENDECTOMY  07/25/2012   Procedure: APPENDECTOMY LAPAROSCOPIC;  Surgeon: Zenovia Jarred, MD;  Location: [facility];  Service: General;  Laterality: N/A;  . LAPAROSCOPIC TUBAL LIGATION Bilateral 08/04/2014   Procedure: BILATERAL LAPAROSCOPIC TUBAL LIGATION;  Surgeon: Woodroe Mode, MD;  Location: [facility];  Service: Gynecology;  Laterality: Bilateral;  . LIPOMA EXCISION N/A 09/16/2015   Procedure: EXCISION LIPOMA LUMBAR REGION;  Surgeon: Donnie Mesa, MD;  Location: [facility];  Service: General;  Laterality: N/A;  . WISDOM TOOTH EXTRACTION     Family History  Problem Relation Age of Onset  . Breast cancer Paternal Grandmother   . Colon cancer Paternal Grandfather   . Colon cancer Maternal Grandfather   . Colon polyps Maternal Aunt   . Diabetes Mother   . Anesthesia problems Mother     hard to wake up post-op  . Diabetes Sister   . Anesthesia problems Sister     hard to wake up post-op  . Diabetes Father   . Heart disease Father    Social History  Substance Use Topics  . Smoking status: Current Every Day Smoker    Packs/day: 1.00    Years: 14.00    Types: Cigarettes  . Smokeless tobacco: Current User     Comment: less than 1 pack/day  . Alcohol use No   OB History    Gravida Para Term Preterm AB Living   3 3 2 1  0 3   SAB TAB Ectopic Multiple Live Births   0 0 0 0 1      Obstetric Comments   C-Section Indication: Non-reassuring fetal tracing, growth delay & marked proteinuria & Pre-Eclampsia Eclamptic Seizure during C-section delivery     Review of Systems  Constitutional: Negative for chills, fatigue and fever.  HENT: Positive for congestion and ear pain. Negative for ear discharge, facial swelling, hearing loss, sinus pain, sinus pressure and tinnitus.   Eyes: Negative for  discharge, redness and itching.  Respiratory: Negative for cough, choking and shortness of breath.   Cardiovascular: Negative for chest pain and palpitations.  Gastrointestinal: Negative for abdominal pain, diarrhea, nausea and vomiting.  Genitourinary: Negative for dysuria and frequency.  Musculoskeletal: Negative for back pain, neck pain and neck stiffness.  Skin: Negative for color change and pallor.  Neurological: Positive for dizziness. Negative for weakness and light-headedness.    Allergies  Patient has no known allergies.  Home Medications   Prior to Admission medications   Medication Sig Start Date End Date Taking? Authorizing Provider   nortriptyline (PAMELOR) 25 MG capsule Take 2 capsules at night 04/28/16  Yes Cameron Sprang, MD  pantoprazole (PROTONIX) 40 MG tablet TAKE 2 TABLETS(80 MG) BY MOUTH DAILY 10/16/16  Yes Sela Hua, MD  amoxicillin-clavulanate (AUGMENTIN) 875-125 MG tablet Take 2 tablets by mouth 2 (two) times daily. 10/16/16 10/23/16  Barnet Glasgow, NP  cetirizine-pseudoephedrine (ZYRTEC-D) 5-120 MG tablet Take 1 tablet by mouth daily. 10/16/16   Barnet Glasgow, NP  fluticasone (FLONASE) 50 MCG/ACT nasal spray Place 2 sprays into both nostrils daily. 10/16/16   Barnet Glasgow, NP   Meds Ordered and Administered this Visit  Medications - No data to display  BP 123/74 (BP Location: Right Arm)   Pulse (!) 116   Temp 98.6 F (37 C) (Oral)   Resp 18   LMP 08/14/2016 (Exact Date)   SpO2 99%  No data found.   Physical Exam  Constitutional: She is oriented to person, place, and time. She appears well-developed and well-nourished. No distress.  HENT:  Head: Normocephalic and atraumatic.  Right Ear: External ear normal. There is swelling. No mastoid tenderness. Tympanic membrane is bulging. Tympanic membrane is not erythematous. A middle ear effusion is present.  Left Ear: Tympanic membrane and external ear normal.  Nose: Nose normal. Right sinus exhibits no maxillary sinus tenderness and no frontal sinus tenderness. Left sinus exhibits no maxillary sinus tenderness and no frontal sinus tenderness.  Mouth/Throat: Uvula is midline, oropharynx is clear and moist and mucous membranes are normal. Tonsils are 0 on the right. Tonsils are 0 on the left. No tonsillar exudate.  Suppurative otitis media the right ear  Eyes: EOM are normal. Pupils are equal, round, and reactive to light.  Neck: Normal range of motion. Neck supple.  Cardiovascular: Normal rate and regular rhythm.   Pulmonary/Chest: Effort normal and breath sounds normal.  Abdominal: Soft.  Lymphadenopathy:    She has no cervical adenopathy.   Neurological: She is alert and oriented to person, place, and time.  Skin: Skin is warm and dry. Capillary refill takes less than 2 seconds. She is not diaphoretic.  Psychiatric: She has a normal mood and affect. Her behavior is normal.  Nursing note and vitals reviewed.   Urgent Care Course     Procedures (including critical care time)  Labs Review Labs Reviewed - No data to display  Imaging Review No results found.       MDM   1. Acute suppurative otitis media of right ear without spontaneous rupture of tympanic membrane, recurrence not specified     Treating for acute otitis media, given prescription for Augmentin, patient request over-the-counter prescriptions for Flonase and Zyrtec D. Prescriptions were provided. Recommend Tylenol or ibuprofen for pain relief, follow-up with primary care or return to clinic if symptoms persist past one week.     Barnet Glasgow, NP 10/16/16 1931

## 2016-10-16 NOTE — Discharge Instructions (Signed)
I'm treating you today for otitis media, otherwise known as an ear infection I prescribed Augmentin, take 2 tablets twice a day for 7 days. Also recommend taking Flonase 2 sprays each nostril daily, and combination antihistamine and decongestant daily. Should your pain persist past one week, follow up with her primary care provider, or return to clinic as needed.

## 2016-10-16 NOTE — ED Triage Notes (Signed)
The patient presented to the [facility] with a complaint of bilateral ear pain x 2 days.

## 2016-10-24 ENCOUNTER — Ambulatory Visit (INDEPENDENT_AMBULATORY_CARE_PROVIDER_SITE_OTHER): Payer: Medicaid Other | Admitting: Family Medicine

## 2016-10-24 VITALS — BP 110/64 | HR 100 | Temp 97.8°F | Wt 169.6 lb

## 2016-10-24 DIAGNOSIS — N762 Acute vulvitis: Secondary | ICD-10-CM | POA: Diagnosis present

## 2016-10-24 DIAGNOSIS — N898 Other specified noninflammatory disorders of vagina: Secondary | ICD-10-CM

## 2016-10-24 LAB — POCT WET PREP (WET MOUNT)
Clue Cells Wet Prep Whiff POC: NEGATIVE
Trichomonas Wet Prep HPF POC: ABSENT

## 2016-10-24 MED ORDER — FLUCONAZOLE 150 MG PO TABS: 150.0000 mg | ORAL_TABLET | Freq: Once | ORAL | 1 refills | Status: AC

## 2016-10-24 NOTE — Progress Notes (Signed)
    Subjective: CC: yeast infection HPI: Patient is a 26 y.o. female with a past medical history of hysterectomy 8 weeks ago and recent antibiotic use presenting to clinic today for vulvar pruritis.  Patient notes she started antibiotics last Monday due to an ear infection and completed the antibiotics yesterday. 3 days ago, she started developing vulvar pruritus. She denies any vaginal discharge. No vaginal discharge. She has not had sexual intercourse. No concerns for STDs. No fevers or chills. No suprapubic or abdominal pain.  Social History: Nonsmoker   ROS: All other systems reviewed and are negative.  Past Medical History Patient Active Problem List   Diagnosis Date Noted  . Vulvitis 10/24/2016  . Incomplete emptying of bladder 08/14/2016  . Migraine 02/10/2016  . Abnormal uterine bleeding (AUB) 01/13/2016  . Dysmenorrhea 01/13/2016  . H/O tubal ligation 01/13/2016  . Gingivitis 08/19/2015  . Encounter for smoking cessation counseling 08/19/2015  . 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 PTSD 03/02/2010  . Tobacco use disorder 02/07/2008  . PAP SMEAR, LGSIL, ABNORMAL 11/27/2006  . 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 BP 110/64   Pulse 100   Temp 97.8 F (36.6 C) (Oral)   Wt 169 lb 9.6 oz (76.9 kg)   LMP 08/14/2016 (Exact Date)   SpO2 97%   BMI 31.02 kg/m    Physical Examination:  General: Awake, alert, well- nourished, NAD GI: soft, NT/ND,+BS x4, no hepatomegaly, no splenomegaly GYN:  Vulvar irritation and white discharge noted in the labial folds.  Vaginal mucosa pink, moist, normal rugae.  Minimal discharge noted on speculum exam without bleeding.  Cuff noted with sutures still present, no erythema noted.      Wet prep negative.  Assessment/Plan: Vulvitis History and exam consistent with a use infection. Wet prep without yeast, however I suspect this is inaccurate. - Rx  for diflucan 150mg  with 1 refill if symptoms persist 5 days after treatment - return precautions discussed.   Orders Placed This Encounter  Procedures  . POCT Wet Prep [facility])    Meds ordered this encounter  Medications  . fluconazole (DIFLUCAN) 150 MG tablet    Sig: Take 1 tablet (150 mg total) by mouth once.    Dispense:  1 tablet    Refill:  [facility] PGY-3, [place]

## 2016-10-24 NOTE — Assessment & Plan Note (Signed)
History and exam consistent with a use infection. Wet prep without yeast, however I suspect this is inaccurate. - Rx for diflucan 150mg  with 1 refill if symptoms persist 5 days after treatment - return precautions discussed.

## 2016-10-24 NOTE — Patient Instructions (Signed)
I have sent in a prescription for diflucan to help with the yeast infection. If your symptoms do not resolve within 5 days, you can refill the prescription and take 1 additional tablet.   Vaginal Yeast infection, Adult Vaginal yeast infection is a condition that causes soreness, swelling, and redness (inflammation) of the vagina. It also causes vaginal discharge. This is a common condition. Some women get this infection frequently. What are the causes? This condition is caused by a change in the normal balance of the yeast (candida) and bacteria that live in the vagina. This change causes an overgrowth of yeast, which causes the inflammation. What increases the risk? This condition is more likely to develop in:  Women who take antibiotic medicines.  Women who have diabetes.  Women who take birth control pills.  Women who are pregnant.  Women who douche often.  Women who have a weak defense (immune) system.  Women who have been taking steroid medicines for a long time.  Women who frequently wear tight clothing. What are the signs or symptoms? Symptoms of this condition include:  White, thick vaginal discharge.  Swelling, itching, redness, and irritation of the vagina. The lips of the vagina (vulva) may be affected as well.  Pain or a burning feeling while urinating.  Pain during sex. How is this diagnosed? This condition is diagnosed with a medical history and physical exam. This will include a pelvic exam. Your health care provider will examine a sample of your vaginal discharge under a microscope. Your health care provider may send this sample for testing to confirm the diagnosis. How is this treated? This condition is treated with medicine. Medicines may be over-the-counter or prescription. You may be told to use one or more of the following:  Medicine that is taken orally.  Medicine that is applied as a cream.  Medicine that is inserted directly into the vagina  (suppository). Follow these instructions at home:  Take or apply over-the-counter and prescription medicines only as told by your health care provider.  Do not have sex until your health care provider has approved. Tell your sex partner that you have a yeast infection. That person should go to his or her health care provider if he or she develops symptoms.  Do not wear tight clothes, such as pantyhose or tight pants.  Avoid using tampons until your health care provider approves.  Eat more yogurt. This may help to keep your yeast infection from returning.  Try taking a sitz bath to help with discomfort. This is a warm water bath that is taken while you are sitting down. The water should only come up to your hips and should cover your buttocks. Do this 3-4 times per day or as told by your health care provider.  Do not douche.  Wear breathable, cotton underwear.  If you have diabetes, keep your blood sugar levels under control. Contact a health care provider if:  You have a fever.  Your symptoms go away and then return.  Your symptoms do not get better with treatment.  Your symptoms get worse.  You have new symptoms.  You develop blisters in or around your vagina.  You have blood coming from your vagina and it is not your menstrual period.  You develop pain in your abdomen. This information is not intended to replace advice given to you by your health care provider. Make sure you discuss any questions you have with your health care provider. Document Released: 04/12/2005 Document Revised: 12/15/2015  Document Reviewed: 01/04/2015 Elsevier Interactive Patient Education  2017 [employer].

## 2016-10-27 ENCOUNTER — Encounter: Payer: Self-pay | Admitting: Internal Medicine

## 2016-10-27 ENCOUNTER — Ambulatory Visit (INDEPENDENT_AMBULATORY_CARE_PROVIDER_SITE_OTHER): Payer: Medicaid Other | Admitting: Internal Medicine

## 2016-10-27 VITALS — BP 124/70 | HR 108 | Temp 98.4°F | Ht 62.0 in | Wt 170.0 lb

## 2016-10-27 DIAGNOSIS — F411 Generalized anxiety disorder: Secondary | ICD-10-CM | POA: Diagnosis present

## 2016-10-27 DIAGNOSIS — F419 Anxiety disorder, unspecified: Secondary | ICD-10-CM | POA: Insufficient documentation

## 2016-10-27 NOTE — Progress Notes (Signed)
   [facility] Phone: [phone]  Subjective:  Michelle [name] is a 26 year old female presenting to clinic with anxiety. She has had anxiety for years, but it has never been bad enough to take medications. After her hysterectomy, she feels like her anxiety has gotten out of control. She feels like her thoughts are racing, especially at night. She has difficulty sleeping. She is also having nightmares. She feels her heart racing and feels nauseated. She also notes decreased appetite. Her anxiety is the worst it's ever been. She has been taking Trazodone 100mg  occasionally at night, but this hasn't been helping. No depression. She was recently up for three nights in a row without sleeping at all. No increased spending. She has seen a therapist in the past, ~10 years ago. She states "therapy is not for me". She states she has been told that she has bipolar disorder in the past, but she has also been told that she does not have bipolar disorder and that she actually just has PTSD. No SI, no HI.  ROS: See HPI for pertinent positives and negatives  Past Medical History- migraines, tobacco use, PTSD, bipolar disorder  Family history reviewed for today's visit. No changes.  Social history- patient is a current smoker. No recreational drug use.  Objective: BP 124/70   Pulse (!) 108   Temp 98.4 F (36.9 C) (Oral)   Ht 5\' 2"  (1.575 m)   Wt 170 lb (77.1 kg)   LMP 08/14/2016 (Exact Date)   SpO2 96%   BMI 31.09 kg/m  Gen: Alert, occasionally tearful throughout exam Psych: Appears anxious, occasionally tearful, normal rate of speech, normal thought content, normal judgment  Assessment/Plan: Anxiety, severe: Uncontrolled. GAD-7 score was a 21 in clinic today. Patient has been told that she has bipolar disorder by one doctor, but was then told that she just has PTSD by another doctor. Has seen a therapist in the past and does not want to restart therapy at this time. No SI, no HI. -  Patient given a list of psychiatrists that accept Medicaid in [place]. Encouraged patient to call ASAP to schedule an appointment. - Will hold off on starting any medications at this time, pending further evaluation by psychiatry. - Precepted with Dr. Pincus Sanes, MD PGY-2

## 2016-10-27 NOTE — Assessment & Plan Note (Signed)
Uncontrolled. GAD-7 score was a 21 in clinic today. Patient has been told that she has bipolar disorder by one doctor, but was then told that she just has PTSD by another doctor. Has seen a therapist in the past and does not want to restart therapy at this time. No SI, no HI. - Patient given a list of psychiatrists that accept Medicaid in [place]. Encouraged patient to call ASAP to schedule an appointment. - Will hold off on starting any medications at this time, pending further evaluation by psychiatry. - Precepted with Dr. Nori Riis

## 2016-10-27 NOTE — Patient Instructions (Signed)
It was wonderful to see you!  I have given you a list of psychiatrists that accept Medicaid. I would recommend calling them today to see if anyone has an opening in the next week.  -Dr. Brett Albino

## 2016-11-08 ENCOUNTER — Telehealth: Payer: Self-pay | Admitting: Neurology

## 2016-11-08 ENCOUNTER — Ambulatory Visit (INDEPENDENT_AMBULATORY_CARE_PROVIDER_SITE_OTHER): Payer: Medicaid Other

## 2016-11-08 DIAGNOSIS — G43009 Migraine without aura, not intractable, without status migrainosus: Secondary | ICD-10-CM

## 2016-11-08 MED ORDER — DEXTROSE 5 % IV SOLN
10.0000 mg | Freq: Once | INTRAVENOUS | Status: AC
  Administered 2016-11-08: 10 mg via INTRAMUSCULAR

## 2016-11-08 MED ORDER — DIPHENHYDRAMINE HCL 50 MG/ML IJ SOLN
25.0000 mg | Freq: Once | INTRAMUSCULAR | Status: AC
  Administered 2016-11-08: 25 mg via INTRAMUSCULAR

## 2016-11-08 MED ORDER — KETOROLAC TROMETHAMINE 60 MG/2ML IM SOLN
60.0000 mg | Freq: Once | INTRAMUSCULAR | Status: AC
  Administered 2016-11-08: 60 mg via INTRAMUSCULAR

## 2016-11-08 NOTE — Telephone Encounter (Signed)
That is fine, pls make sure she has someone to drive her because the medications can make her sleepy. Pls give Dr. Georgie Chard migraine cocktail (Toradol 60mg , Reglan 10mg , Benadryl 25mg ), thanks!

## 2016-11-08 NOTE — Telephone Encounter (Signed)
Patient called in requesting to come in for a Headache cocktail today?  Thank you

## 2016-11-08 NOTE — Telephone Encounter (Signed)
Will forward to provider  

## 2016-11-10 ENCOUNTER — Other Ambulatory Visit: Payer: Self-pay

## 2016-11-10 ENCOUNTER — Encounter (HOSPITAL_COMMUNITY): Payer: Self-pay

## 2016-11-10 DIAGNOSIS — Y929 Unspecified place or not applicable: Secondary | ICD-10-CM | POA: Insufficient documentation

## 2016-11-10 DIAGNOSIS — W228XXA Striking against or struck by other objects, initial encounter: Secondary | ICD-10-CM | POA: Insufficient documentation

## 2016-11-10 DIAGNOSIS — S50311A Abrasion of right elbow, initial encounter: Secondary | ICD-10-CM | POA: Diagnosis not present

## 2016-11-10 DIAGNOSIS — Z5321 Procedure and treatment not carried out due to patient leaving prior to being seen by health care provider: Secondary | ICD-10-CM | POA: Diagnosis not present

## 2016-11-10 DIAGNOSIS — Y999 Unspecified external cause status: Secondary | ICD-10-CM | POA: Insufficient documentation

## 2016-11-10 DIAGNOSIS — Y939 Activity, unspecified: Secondary | ICD-10-CM | POA: Diagnosis not present

## 2016-11-10 NOTE — ED Triage Notes (Signed)
Pt tachy at triage, HR = 146. Pt denies any chest pain or SOB. Pt states hx of same.

## 2016-11-10 NOTE — ED Triage Notes (Signed)
Pt complaining of small abrasion to R elbow. Pt states struck arm on faucet. Bleeding controlled at triage. VSS, NAD.

## 2016-11-11 ENCOUNTER — Emergency Department (HOSPITAL_COMMUNITY)
Admission: EM | Admit: 2016-11-11 | Discharge: 2016-11-11 | Disposition: A | Discharge status: Home / Self Care | Payer: Medicaid Other | Attending: Dermatology | Admitting: Dermatology

## 2016-11-11 NOTE — ED Notes (Signed)
Pt requesting to leave. RN encouraged pt to stay and be seen. Pt continued to request to leave. RN encouraged pt to return if worsening symptoms.

## 2016-11-13 ENCOUNTER — Ambulatory Visit (INDEPENDENT_AMBULATORY_CARE_PROVIDER_SITE_OTHER): Payer: Medicaid Other | Admitting: Neurology

## 2016-11-13 ENCOUNTER — Encounter: Payer: Self-pay | Admitting: Neurology

## 2016-11-13 VITALS — BP 132/76 | HR 114 | Temp 98.7°F | Ht 62.0 in | Wt 170.0 lb

## 2016-11-13 DIAGNOSIS — G43009 Migraine without aura, not intractable, without status migrainosus: Secondary | ICD-10-CM

## 2016-11-13 DIAGNOSIS — F419 Anxiety disorder, unspecified: Secondary | ICD-10-CM | POA: Diagnosis not present

## 2016-11-13 MED ORDER — BUSPIRONE HCL 5 MG PO TABS: 5.0000 mg | ORAL_TABLET | Freq: Three times a day (TID) | ORAL | 2 refills | Status: DC

## 2016-11-13 MED ORDER — NORTRIPTYLINE HCL 25 MG PO CAPS: ORAL_CAPSULE | ORAL | 6 refills | Status: DC

## 2016-11-13 MED ORDER — PREDNISONE 20 MG PO TABS: ORAL_TABLET | ORAL | 0 refills | Status: DC

## 2016-11-13 NOTE — Patient Instructions (Signed)
1. Increase nortriptyline 25mg : take 3 capsules at night 2. Take Prednisone 20mg : Take 3 tabs on day 1, 2-1/2 tabs on day 2, 2 tabs on day 3, 1-1/2 tabs on day 4, 1 tab on day 5, 1/2 tab on days 6 and 7, then stop. 3. Start Buspar 5mg  three times a day for anxiety. Discuss this with your psychologist next month for adjustments 4. Call our office in a month for an update, follow-up in 3 months

## 2016-11-13 NOTE — Progress Notes (Signed)
NEUROLOGY FOLLOW UP OFFICE NOTE  Michelle [name] [id number]  HISTORY OF PRESENT ILLNESS: I had the pleasure of seeing Michelle [name] in follow-up in the neurology clinic on 11/13/2016.  The patient was last seen 6 months ago for new daily persistent headaches. I personally reviewed MRI brain without contrast which was normal. She was started on nortriptyline and had good response on 50mg  qhs. She reports that she was actually not having any further headaches until she had a hysterectomy for endometriosis 10 weeks ago. She states that since then, her migraines "came back crazy," she started having migraines every few days with associated nausea and photophobia. She would go 3 nights in a row without sleep, and has been assessed to have severe anxiety which is also likely contributing to insomnia. She tried Trazodone 100mg  qhs with no effect and is awaiting Psychiatry eval next month. This current headache started last Tuesday, she received a migraine cocktail in our office on Thursday of Toradol, Benadryl, and Reglan, with minimal effect. Headache is constant, waxing and waning from 6/10 to 10/10. She fell in the shower last Friday when she started feeling lightheaded and lost balance, injuring her right elbow. She did not pass out or hit her head. She is not taking any prn medication for the headaches.  HPI 03/06/16: This is a 26 yo RH woman with a history of bipolar disorder, PTSD, hypertension, who presented with new onset headaches since June 2016. 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 They were so bad that she was shaking on the floor and felt like she would pass out. She went to the ER on 01/06/16 and reports having daily headaches that wax and wane in intensity since then. She would wake up with a headache then towards the evening they  become more severe 10/10 with throbbing pain and nausea. She occasionally sees black dots in her vision. No positional component. She stopped Depo-Provera last week. No photo/phonophobia, tinnitus, focal numbness/tingling/weakness, dizziness, diplopia, dysarthria, dysphagia, bowel/bladder dysfunction. She has had neck pains as well. She has chronic back pain.   She has been dealing with significant stomach cramps and will be seeing a specialist regarding concern for endometriosis. She has been taking Tramadol and Percocet daily for the cramps, and has prn Fioricet for the headaches, taking 2-3 daily for the past 2 months. She gets 8-9 hours of sleep but still feels exhausted (for the past couple of years). She denies snoring, she has daytime drowsiness. She was given Reglan as well in the ER and takes this daily. There is a history of one seizure during delivery of her third child in 2013. No family history of migraines.   PAST MEDICAL HISTORY: Past Medical History:  Diagnosis Date  . Anxiety   . Asthma   . Bipolar disorder ([place])    no current med.  . Depression    no current med.  . Family history of adverse reaction to anesthesia    states mother and sister are hard to wake up post-op  . Gestational diabetes   . History of seizure 06/20/2012   x 1 - during delivery of child(eclampsia)  . Hypertension   . Lipoma of lower back 08/2015  . Migraine   . PTSD (post-traumatic stress disorder)   . PTSD (post-traumatic stress disorder)   . TMJ (dislocation of  temporomandibular joint)   . Vertigo     MEDICATIONS: Current Outpatient Prescriptions on File Prior to Visit  Medication Sig Dispense Refill  . cetirizine-pseudoephedrine (ZYRTEC-D) 5-120 MG tablet Take 1 tablet by mouth daily. 30 tablet 0  . fluticasone (FLONASE) 50 MCG/ACT nasal spray Place 2 sprays into both nostrils daily. 15.8 g 2  . nortriptyline (PAMELOR) 25 MG capsule Take 2 capsules at night 60 capsule 6  . pantoprazole  (PROTONIX) 40 MG tablet TAKE 2 TABLETS(80 MG) BY MOUTH DAILY 120 tablet 0   No current facility-administered medications on file prior to visit.     ALLERGIES: No Known Allergies  FAMILY HISTORY: Family History  Problem Relation Age of Onset  . Breast cancer Paternal Grandmother   . Colon cancer Paternal Grandfather   . Colon cancer Maternal Grandfather   . Colon polyps Maternal Aunt   . Diabetes Mother   . Anesthesia problems Mother     hard to wake up post-op  . Diabetes Sister   . Anesthesia problems Sister     hard to wake up post-op  . Diabetes Father   . Heart disease Father     SOCIAL HISTORY: Social History   Social History  . Marital status: Legally Separated    Spouse name: N/A  . Number of children: 2  . Years of education: N/A   Occupational History  . Not on file.   Social History Main Topics  . Smoking status: Current Every Day Smoker    Packs/day: 1.00    Years: 14.00    Types: Cigarettes  . Smokeless tobacco: Current User     Comment: less than 1 pack/day  . Alcohol use No  . Drug use: No  . Sexual activity: Yes    Birth control/ protection: Surgical, None   Other Topics Concern  . Not on file   Social History Narrative   Lives with boyfriend and 3 children in a one story home.  Does not work.  Education: 9th grade.    REVIEW OF SYSTEMS: Constitutional: No fevers, chills, or sweats, no generalized fatigue, change in appetite Eyes: No visual changes, double vision, eye pain Ear, nose and throat: No hearing loss, ear pain, nasal congestion, sore throat Cardiovascular: No chest pain, palpitations Respiratory:  No shortness of breath at rest or with exertion, wheezes GastrointestinaI: No nausea, vomiting, diarrhea, abdominal pain, fecal incontinence Genitourinary:  No dysuria, urinary retention or frequency Musculoskeletal:  No neck pain, back pain Integumentary: No rash, pruritus, skin lesions Neurological: as above Psychiatric: +  depression, insomnia, anxiety Endocrine: No palpitations, fatigue, diaphoresis, mood swings, change in appetite, change in weight, increased thirst Hematologic/Lymphatic:  No anemia, purpura, petechiae. Allergic/Immunologic: no itchy/runny eyes, nasal congestion, recent allergic reactions, rashes  PHYSICAL EXAM: Vitals:   11/13/16 0901  BP: 132/76  Pulse: (!) 114  Temp: 98.7 F (37.1 C)   General: No acute distress, wearing dark glasses in the office Head:  Normocephalic/atraumatic Neck: supple, no paraspinal tenderness, full range of motion Heart:  Regular rate and rhythm Lungs:  Clear to auscultation bilaterally Back: No paraspinal tenderness Skin/Extremities: No rash, no edema Neurological Exam: alert and oriented to person, place, and time. No aphasia or dysarthria. Fund of knowledge is appropriate.  Recent and remote memory are intact.  Attention and concentration are normal.    Able to name objects and repeat phrases. Cranial nerves: Pupils equal, round, reactive to light.  Fundoscopic exam unremarkable, no papilledema. Extraocular movements intact with no nystagmus. Visual  fields full. Facial sensation intact. No facial asymmetry. Tongue, uvula, palate midline.  Motor: Bulk and tone normal, muscle strength 5/5 throughout with no pronator drift.  Sensation to light touch intact.  No extinction to double simultaneous stimulation.  Deep tendon reflexes 2+ throughout, toes downgoing.  Finger to nose testing intact.  Gait narrow-based and steady, able to tandem walk adequately.  Romberg negative.  IMPRESSION: This is a 26 yo RH woman with a history of bipolar disorder, PTSD, hypertension, who presented for new onset daily headaches with migranous features. MRI brain normal. She had a good response to nortriptyline with resolution of headaches, however reports that after hysterectomy 10 weeks ago, headaches have again increased in frequency to every few days. This current headache has been  ongoing for almost a week, despite migraine cocktail last Thursday. She will be given a 1-week course of Prednisone to hopefully break the headache cycle. Increase nortriptyline to 75mg  qhs. There appears to be significant anxiety as well causing poor sleep, which is definitely not helping with her headaches. She was given a prescription for Buspar 5mg  TID for the anxiety, side effects discussed. She will discuss further adjustments with her Psychiatrist next month. She was given samples for Imitrex [place] to use prn at the onset of migraine, minimize rescue medication to 2-3 times a week. She will call our office to update [place] in a month, then follow-up in 3 months.   Thank you for allowing me to participate in her care.  Please do not hesitate to call for any questions or concerns.  The duration of this appointment visit was 25 minutes of face-to-face time with the patient.  Greater than 50% of this time was spent in counseling, explanation of diagnosis, planning of further management, and coordination of care.   Ellouise Newer, M.D.   CC: Dr. Brett Albino

## 2016-11-19 ENCOUNTER — Ambulatory Visit (HOSPITAL_COMMUNITY)
Admission: EM | Admit: 2016-11-19 | Discharge: 2016-11-19 | Disposition: A | Discharge status: Home / Self Care | Payer: Medicaid Other | Attending: Internal Medicine | Admitting: Internal Medicine

## 2016-11-19 ENCOUNTER — Encounter (HOSPITAL_COMMUNITY): Payer: Self-pay

## 2016-11-19 DIAGNOSIS — S01339A Puncture wound without foreign body of unspecified ear, initial encounter: Secondary | ICD-10-CM

## 2016-11-19 DIAGNOSIS — L03811 Cellulitis of head [any part, except face]: Secondary | ICD-10-CM | POA: Diagnosis not present

## 2016-11-19 MED ORDER — DOXYCYCLINE HYCLATE 100 MG PO CAPS: 100.0000 mg | ORAL_CAPSULE | Freq: Two times a day (BID) | ORAL | 0 refills | Status: DC

## 2016-11-19 NOTE — ED Triage Notes (Signed)
Pt thinks her ear piercing is infected. She got it done a week ago but since yesterday having discharge from it, redness, swollen and pain. Has not taken any otc medication. Did go to a shop to get this piercing done. Also having sore throat as well.

## 2016-11-19 NOTE — Discharge Instructions (Signed)
Remove the piercing from your ear as this can serve as a source for reinfection. I started on doxycycline, take one tablet twice a day. If at any time your symptoms worsen such as developing fever, chills, nausea, or loss of appetite, return to clinic. If your symptoms fail to resolve after one week follow up with your primary care provider, or return to clinic as needed.

## 2016-11-19 NOTE — ED Provider Notes (Signed)
CSN: [id number]     Arrival date & time 11/19/16  1449 History   First MD Initiated Contact with Patient 11/19/16 1654     Chief Complaint  Patient presents with  . Otitis Media  . Sore Throat   (Consider location/radiation/quality/duration/timing/severity/associated sxs/prior Treatment) 26 year old female presents to clinic for possible infected ear piercing. States she's had redness, swelling, pain, and pus draining from the piercing. Denies any fever, nausea, loss of appetite, or any other systemic symptoms. She reports she did go to a licensed piercing shop to have this done.   The history is provided by the patient.    Past Medical History:  Diagnosis Date  . Anxiety   . Asthma   . Bipolar disorder ([place])    no current med.  . Depression    no current med.  . Family history of adverse reaction to anesthesia    states mother and sister are hard to wake up post-op  . Gestational diabetes   . History of seizure 06/20/2012   x 1 - during delivery of child(eclampsia)  . Hypertension   . Lipoma of lower back 08/2015  . Migraine   . PTSD (post-traumatic stress disorder)   . PTSD (post-traumatic stress disorder)   . TMJ (dislocation of temporomandibular joint)   . Vertigo    Past Surgical History:  Procedure Laterality Date  . ABDOMINAL HYSTERECTOMY    . ADENOIDECTOMY, TONSILLECTOMY AND MYRINGOTOMY WITH TUBE PLACEMENT  1990's  . CESAREAN SECTION  06/20/2012   Procedure: CESAREAN SECTION;  Surgeon: Emily Filbert, MD;  Location: [facility];  Service: Obstetrics;  Laterality: N/A;  . LAPAROSCOPIC APPENDECTOMY  07/25/2012   Procedure: APPENDECTOMY LAPAROSCOPIC;  Surgeon: Zenovia Jarred, MD;  Location: [facility];  Service: General;  Laterality: N/A;  . LAPAROSCOPIC TUBAL LIGATION Bilateral 08/04/2014   Procedure: BILATERAL LAPAROSCOPIC TUBAL LIGATION;  Surgeon: Woodroe Mode, MD;  Location: [facility];  Service: Gynecology;  Laterality: Bilateral;  . LIPOMA EXCISION N/A 09/16/2015   Procedure:  EXCISION LIPOMA LUMBAR REGION;  Surgeon: Donnie Mesa, MD;  Location: [facility];  Service: General;  Laterality: N/A;  . WISDOM TOOTH EXTRACTION     Family History  Problem Relation Age of Onset  . Breast cancer Paternal Grandmother   . Colon cancer Paternal Grandfather   . Colon cancer Maternal Grandfather   . Colon polyps Maternal Aunt   . Diabetes Mother   . Anesthesia problems Mother     hard to wake up post-op  . Diabetes Sister   . Anesthesia problems Sister     hard to wake up post-op  . Diabetes Father   . Heart disease Father    Social History  Substance Use Topics  . Smoking status: Current Every Day Smoker    Packs/day: 1.00    Years: 14.00    Types: Cigarettes  . Smokeless tobacco: Current User     Comment: less than 1 pack/day  . Alcohol use No   OB History    Gravida Para Term Preterm AB Living   3 3 2 1  0 3   SAB TAB Ectopic Multiple Live Births   0 0 0 0 1      Obstetric Comments   C-Section Indication: Non-reassuring fetal tracing, growth delay & marked proteinuria & Pre-Eclampsia Eclamptic Seizure during C-section delivery     Review of Systems  Constitutional: Negative.   HENT: Negative.   Respiratory: Negative.   Cardiovascular: Negative.   Gastrointestinal: Negative.  Musculoskeletal: Negative.   Skin: Positive for wound.  Neurological: Negative.     Allergies  Patient has no known allergies.  Home Medications   Prior to Admission medications   Medication Sig Start Date End Date Taking? Authorizing Provider  albuterol (PROVENTIL HFA;VENTOLIN HFA) 108 (90 Base) MCG/ACT inhaler Inhale into the lungs. 05/15/16  Yes [provider]  busPIRone (BUSPAR) 5 MG tablet Take 1 tablet (5 mg total) by mouth 3 (three) times daily. 11/13/16  Yes Cameron Sprang, MD  nortriptyline [facility]) 25 MG capsule Take 3 capsules at night 11/13/16  Yes Cameron Sprang, MD  pantoprazole (PROTONIX) 40 MG tablet TAKE 2 TABLETS(80 MG) BY  MOUTH DAILY 10/16/16  Yes Mayo, Pete Pelt, MD  cetirizine-pseudoephedrine (ZYRTEC-D) 5-120 MG tablet Take 1 tablet by mouth daily. 10/16/16   Barnet Glasgow, NP  doxycycline (VIBRAMYCIN) 100 MG capsule Take 1 capsule (100 mg total) by mouth 2 (two) times daily. 11/19/16   Barnet Glasgow, NP  predniSONE (DELTASONE) 20 MG tablet Prednisone 20mg : Take 3 tabs on day 1, 2-1/2 tabs on day 2, 2 tabs on day 3, 1-1/2 tabs on day 4, 1 tab on day 5, 1/2 tab on days 6 and 7, then stop. 11/13/16   Cameron Sprang, MD  PROAIR HFA [phone] Base) MCG/ACT inhaler INL 2 PFS ITL Q 4 H PRF WHZ OR SOB 10/16/16   [provider]   Meds Ordered and Administered this Visit  Medications - No data to display  BP 119/81 (BP Location: Right Arm)   Pulse (!) 130   Temp 98.8 F (37.1 C) (Oral)   Resp 20   LMP 08/14/2016 (Exact Date)   SpO2 98%  No data found.   Physical Exam  Constitutional: She is oriented to person, place, and time. She appears well-developed and well-nourished. No distress.  HENT:  Head: Normocephalic and atraumatic.  Right Ear: Tympanic membrane normal. There is swelling and tenderness.  Left Ear: Tympanic membrane and external ear normal.  Ears:  Eyes: Conjunctivae are normal.  Neck: Normal range of motion. No JVD present.  Cardiovascular: Normal rate and regular rhythm.   Pulmonary/Chest: Effort normal and breath sounds normal.  Lymphadenopathy:    She has no cervical adenopathy.  Neurological: She is alert and oriented to person, place, and time.  Skin: Skin is warm and dry. Capillary refill takes less than 2 seconds. No rash noted. She is not diaphoretic. No erythema.  Psychiatric: She has a normal mood and affect. Her behavior is normal.  Nursing note and vitals reviewed.   Urgent Care Course     Procedures (including critical care time)  Labs Review Labs Reviewed - No data to display  Imaging Review No results found.     MDM   1. Cellulitis of head except face    2. Complication of ear piercing, initial encounter    Started on doxycycline for infection, recommend removal of the piercing. Monitor for signs and symptoms of worsening infection, return to clinic as needed, follow-up with primary care as needed.     Barnet Glasgow, NP 11/19/16 1747

## 2016-11-20 ENCOUNTER — Emergency Department (HOSPITAL_COMMUNITY): Payer: Medicaid Other

## 2016-11-20 ENCOUNTER — Emergency Department (HOSPITAL_COMMUNITY)
Admission: EM | Admit: 2016-11-20 | Discharge: 2016-11-21 | Disposition: A | Discharge status: Home / Self Care | Payer: Medicaid Other | Attending: Emergency Medicine | Admitting: Emergency Medicine

## 2016-11-20 ENCOUNTER — Encounter (HOSPITAL_COMMUNITY): Payer: Self-pay | Admitting: Vascular Surgery

## 2016-11-20 DIAGNOSIS — R000 Tachycardia, unspecified: Secondary | ICD-10-CM

## 2016-11-20 DIAGNOSIS — F17210 Nicotine dependence, cigarettes, uncomplicated: Secondary | ICD-10-CM | POA: Insufficient documentation

## 2016-11-20 DIAGNOSIS — D72829 Elevated white blood cell count, unspecified: Secondary | ICD-10-CM | POA: Diagnosis not present

## 2016-11-20 DIAGNOSIS — J45909 Unspecified asthma, uncomplicated: Secondary | ICD-10-CM | POA: Diagnosis not present

## 2016-11-20 DIAGNOSIS — Z79899 Other long term (current) drug therapy: Secondary | ICD-10-CM | POA: Diagnosis not present

## 2016-11-20 DIAGNOSIS — R42 Dizziness and giddiness: Secondary | ICD-10-CM | POA: Diagnosis present

## 2016-11-20 LAB — BASIC METABOLIC PANEL
Anion gap: 10 (ref 5–15)
BUN: 9 mg/dL (ref 6–20)
CO2: 22 mmol/L (ref 22–32)
Calcium: 9.3 mg/dL (ref 8.9–10.3)
Chloride: 105 mmol/L (ref 101–111)
Creatinine, Ser: 0.7 mg/dL (ref 0.44–1.00)
GFR calc Af Amer: >60 mL/min (ref >60)
GFR calc non Af Amer: >60 mL/min (ref >60)
Glucose, Bld: 128 mg/dL — ABNORMAL HIGH (ref 65–99)
Potassium: 4 mmol/L (ref 3.5–5.1)
Sodium: 137 mmol/L (ref 135–145)

## 2016-11-20 LAB — I-STAT CG4 LACTIC ACID, ED: Lactic Acid, Venous: 2.35 mmol/L (ref 0.5–1.9)

## 2016-11-20 LAB — URINALYSIS, ROUTINE W REFLEX MICROSCOPIC
Bilirubin Urine: NEGATIVE
Glucose, UA: NEGATIVE mg/dL
Hgb urine dipstick: NEGATIVE
Ketones, ur: NEGATIVE mg/dL
Leukocytes, UA: NEGATIVE
Nitrite: NEGATIVE
Protein, ur: NEGATIVE mg/dL
Specific Gravity, Urine: 1.01 (ref 1.005–1.030)
pH: 7 (ref 5.0–8.0)

## 2016-11-20 LAB — CBC
HCT: 41.6 % (ref 36.0–46.0)
Hemoglobin: 14 g/dL (ref 12.0–15.0)
MCH: 31 pg (ref 26.0–34.0)
MCHC: 33.7 g/dL (ref 30.0–36.0)
MCV: 92.2 fL (ref 78.0–100.0)
Platelets: 307 10*3/uL (ref 150–400)
RBC: 4.51 MIL/uL (ref 3.87–5.11)
RDW: 13.4 % (ref 11.5–15.5)
WBC: 26.5 10*3/uL — ABNORMAL HIGH (ref 4.0–10.5)

## 2016-11-20 LAB — I-STAT TROPONIN, ED: Troponin i, poc: 0 ng/mL (ref 0.00–0.08)

## 2016-11-20 MED ORDER — SODIUM CHLORIDE 0.9 % IV BOLUS (SEPSIS)
500.0000 mL | Freq: Once | INTRAVENOUS | Status: AC
  Administered 2016-11-20: 500 mL via INTRAVENOUS

## 2016-11-20 MED ORDER — VANCOMYCIN HCL IN DEXTROSE 1-5 GM/200ML-% IV SOLN
1000.0000 mg | Freq: Once | INTRAVENOUS | Status: AC
  Administered 2016-11-20: 1000 mg via INTRAVENOUS
  Filled 2016-11-20: qty 200

## 2016-11-20 MED ORDER — SODIUM CHLORIDE 0.9 % IV BOLUS (SEPSIS)
1000.0000 mL | Freq: Once | INTRAVENOUS | Status: AC
  Administered 2016-11-20: 1000 mL via INTRAVENOUS

## 2016-11-20 MED ORDER — PIPERACILLIN-TAZOBACTAM 3.375 G IVPB 30 MIN
3.3750 g | Freq: Once | INTRAVENOUS | Status: AC
  Administered 2016-11-20: 3.375 g via INTRAVENOUS
  Filled 2016-11-20: qty 50

## 2016-11-20 NOTE — ED Provider Notes (Signed)
[facility] DEPT Provider Note   CSN: [id number] Arrival date & time: 11/20/16  2500     History   Chief Complaint Chief Complaint  Patient presents with  . Tachycardia  . Dizziness    HPI VERMELLE CAMMARATA is a 26 y.o. female.  This a 26 year old female who was seen yesterday at urgent care for an ear piercing infection, was started on doxycycline.  She presents to the emergency room in today with tachycardia and dizziness.  She states that she is been afebrile.  There's been no drainage from the piercing site was recommended by the [facility] sure that she not remove her piercing, but continue the antibiotic.  Patient states that since the piercing which changed to a non-nickel product the irritation and pain has decreased.  Denies any discharge or swelling from the site. She does state that she recently finished a course of steroids 2 days ago, and recently started on medication for severe anxiety Denies any recent illnesses, nausea, vomiting, diarrhea, dysuria, cough.      Past Medical History:  Diagnosis Date  . Anxiety   . Asthma   . Bipolar disorder ([place])    no current med.  . Depression    no current med.  . Family history of adverse reaction to anesthesia    states mother and sister are hard to wake up post-op  . Gestational diabetes   . History of seizure 06/20/2012   x 1 - during delivery of child(eclampsia)  . Hypertension   . Lipoma of lower back 08/2015  . Migraine   . PTSD (post-traumatic stress disorder)   . PTSD (post-traumatic stress disorder)   . TMJ (dislocation of temporomandibular joint)   . Vertigo     Patient Active Problem List   Diagnosis Date Noted  . Anxiety state 10/27/2016  . Migraine 02/10/2016  . PTSD 03/02/2010  . Tobacco use disorder 02/07/2008  . PAP SMEAR, LGSIL, ABNORMAL 11/27/2006  . 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 ABDOMINAL HYSTERECTOMY    .  ADENOIDECTOMY, TONSILLECTOMY AND MYRINGOTOMY WITH TUBE PLACEMENT  1990's  . CESAREAN SECTION  06/20/2012   Procedure: CESAREAN SECTION;  Surgeon: Emily Filbert, MD;  Location: [facility];  Service: Obstetrics;  Laterality: N/A;  . LAPAROSCOPIC APPENDECTOMY  07/25/2012   Procedure: APPENDECTOMY LAPAROSCOPIC;  Surgeon: Zenovia Jarred, MD;  Location: [facility];  Service: General;  Laterality: N/A;  . LAPAROSCOPIC TUBAL LIGATION Bilateral 08/04/2014   Procedure: BILATERAL LAPAROSCOPIC TUBAL LIGATION;  Surgeon: Woodroe Mode, MD;  Location: [facility];  Service: Gynecology;  Laterality: Bilateral;  . LIPOMA EXCISION N/A 09/16/2015   Procedure: EXCISION LIPOMA LUMBAR REGION;  Surgeon: Donnie Mesa, MD;  Location: [facility];  Service: General;  Laterality: N/A;  . WISDOM TOOTH EXTRACTION      OB History    Gravida Para Term Preterm AB Living   3 3 2 1  0 3   SAB TAB Ectopic Multiple Live Births   0 0 0 0 1      Obstetric Comments   C-Section Indication: Non-reassuring fetal tracing, growth delay & marked proteinuria & Pre-Eclampsia Eclamptic Seizure during C-section delivery       Home Medications    Prior to Admission medications   Medication Sig Start Date End Date Taking? Authorizing Provider  albuterol (PROAIR HFA) 108 (90 Base) MCG/ACT inhaler Inhale 2 puffs into the lungs every 6 (six)  hours as needed for wheezing or shortness of breath.   Yes [provider]  busPIRone (BUSPAR) 5 MG tablet Take 1 tablet (5 mg total) by mouth 3 (three) times daily. 11/13/16  Yes Cameron Sprang, MD  DiphenhydrAMINE HCl (ZZZQUIL) 50 MG/30ML LIQD Take 50 mg by mouth at bedtime as needed (sleep).   Yes [provider]  doxycycline (VIBRAMYCIN) 100 MG capsule Take 1 capsule (100 mg total) by mouth 2 (two) times daily. Patient taking differently: Take 100 mg by mouth 2 (two) times daily. 10 day course started 11/19/16 pm 11/19/16  Yes Barnet Glasgow, NP  ibuprofen (ADVIL,MOTRIN) 200 MG  tablet Take 800 mg by mouth every 6 (six) hours as needed (pain).   Yes [provider]  nortriptyline (PAMELOR) 25 MG capsule Take 3 capsules at night Patient taking differently: Take 75 mg by mouth at bedtime. Take 3 capsules at night 11/13/16  Yes Cameron Sprang, MD  pantoprazole (PROTONIX) 40 MG tablet TAKE 2 TABLETS(80 MG) BY MOUTH DAILY Patient taking differently: TAKE 2 TABLETS(80 MG) BY MOUTH DAILY AT BEDTIME 10/16/16  Yes Mayo, Pete Pelt, MD  SUMATRIPTAN SUCCINATE IJ Inject 1 each into the muscle once as needed (migraine).   Yes [provider]  cetirizine-pseudoephedrine (ZYRTEC-D) 5-120 MG tablet Take 1 tablet by mouth daily. Patient not taking: Reported on 11/20/2016 10/16/16   Barnet Glasgow, NP    Family History Family History  Problem Relation Age of Onset  . Breast cancer Paternal Grandmother   . Colon cancer Paternal Grandfather   . Colon cancer Maternal Grandfather   . Colon polyps Maternal Aunt   . Diabetes Mother   . Anesthesia problems Mother     hard to wake up post-op  . Diabetes Sister   . Anesthesia problems Sister     hard to wake up post-op  . Diabetes Father   . Heart disease Father     Social History Social History  Substance Use Topics  . Smoking status: Current Every Day Smoker    Packs/day: 1.00    Years: 14.00    Types: Cigarettes  . Smokeless tobacco: Current User     Comment: less than 1 pack/day  . Alcohol use No     Allergies   Patient has no known allergies.   Review of Systems Review of Systems   Physical Exam Updated Vital Signs BP 118/78   Pulse (!) 102   Temp 99.1 F (37.3 C) (Rectal)   Resp 15   LMP 08/14/2016 (Exact Date)   SpO2 97%   Physical Exam   ED Treatments / Results  Labs (all labs ordered are listed, but only abnormal results are displayed) Labs Reviewed  BASIC METABOLIC PANEL - Abnormal; Notable for the following:       Result Value   Glucose, Bld 128 (*)    All other components  within normal limits  CBC - Abnormal; Notable for the following:    WBC 26.5 (*)    All other components within normal limits  URINALYSIS, ROUTINE W REFLEX MICROSCOPIC - Abnormal; Notable for the following:    Color, Urine STRAW (*)    All other components within normal limits  I-STAT CG4 LACTIC ACID, ED - Abnormal; Notable for the following:    Lactic Acid, Venous 2.35 (*)    All other components within normal limits  CULTURE, BLOOD (ROUTINE X 2)  CULTURE, BLOOD (ROUTINE X 2)  I-STAT TROPOININ, ED    EKG  EKG  Interpretation  Date/Time:  Monday Nov 20 2016 22:01:53 EDT Ventricular Rate:  115 PR Interval:    QRS Duration: 76 QT Interval:  315 QTC Calculation: 436 R Axis:   53 Text Interpretation:  Sinus tachycardia Low voltage, precordial leads Borderline T abnormalities, anterior leads Confirmed by [facility] MD, Corene Cornea [phone]) on 11/20/2016 10:05:23 PM       Radiology Dg Chest 2 View  Result Date: 11/20/2016 CLINICAL DATA:  Shortness of breath EXAM: CHEST  2 VIEW COMPARISON:  06/29/2016 FINDINGS: The heart size and mediastinal contours are within normal limits. Both lungs are clear. The visualized skeletal structures are unremarkable. IMPRESSION: No active cardiopulmonary disease. Electronically Signed   By: Jerilynn Mages.  Shick M.D.   On: 11/20/2016 20:17    Procedures Procedures (including critical care time)  Medications Ordered in ED Medications  sodium chloride 0.9 % bolus 1,000 mL (not administered)    And  sodium chloride 0.9 % bolus 1,000 mL (1,000 mLs Intravenous New Bag/Given 11/20/16 2222)    And  sodium chloride 0.9 % bolus 500 mL (not administered)  piperacillin-tazobactam (ZOSYN) IVPB 3.375 g (0 g Intravenous Stopped 11/20/16 2305)  vancomycin (VANCOCIN) IVPB 1000 mg/200 mL premix (1,000 mg Intravenous New Bag/Given 11/20/16 2222)     Initial Impression / Assessment and Plan / ED Course  I have reviewed the triage vital signs and the nursing notes.  Pertinent labs &  imaging results that were available during my care of the patient were reviewed by me and considered in my medical decision making (see chart for details).     Patient does have tachycardia.  She is not hypotensive.  She does have an elevated white count of 24.5.  This may be artificially elevated due to the recent course of steroids.  We'll continue with plan for early sepsis, obtaining a lactic acid, IV hydration, antibiotics and then reassess. Tatian's heart rate is 102 at time of discharge.  In review of her records, she tends to be tachycardic, most of the time between 100 and 114.  She'll begin a cardiac referral for further investigation of persistent tachycardia.  She has been instructed to continue taking the doxycycline for potential cellulitis at her ear piercing. Leukocytosis is most likely reactive.  Since she just finished taking a course of prednisone one day prior. Final Clinical Impressions(s) / ED Diagnoses   Final diagnoses:  Tachycardia  Leukocytosis, unspecified type    New Prescriptions New Prescriptions   No medications on file     Junius Creamer, NP 11/20/16 2339    Merrily Pew, MD 11/21/16 1850

## 2016-11-20 NOTE — ED Triage Notes (Signed)
Pt reports to the ED for eval of sensation of tachycardia, lightheadedness, and she feels like she is shaky and some generalized weakness. She reports nausea but denies any active vomiting. She has been eating and drinking normally today. She states that last night she had an aching pain in her epigastric area which she attributes to indigestion. She also reports some SOB. Denies any hx of similar symptoms. She is tachycardic the in 140s at this time.

## 2016-11-20 NOTE — Discharge Instructions (Signed)
Today, your white count is24.5, which is quite elevated.  You were treated for potential sepsis with IV fluids and antibiotics.  You were also found to be tachycardic or with a rapid heart rate of 144 after fluids it normalized to just over 100 in review of your past visits, you have been found to be tachycardic at baseline.  You have been given a referral to cardiology for further evaluation. Please continue taking the antibiotic prescribed yesterday for ear piercing infection.

## 2016-11-20 NOTE — ED Triage Notes (Signed)
She was seen by [facility] yesterday for an ear piercing infection and was started on an abx denies any rash or itching or throat swelling. She has been taking her abx.

## 2016-11-20 NOTE — ED Notes (Signed)
Dr Dayna Barker given copy of lactic acid results 2.35

## 2016-11-20 NOTE — ED Notes (Signed)
No addl blood draw

## 2016-11-21 NOTE — ED Notes (Signed)
Culture drawn

## 2016-11-21 NOTE — ED Notes (Signed)
Pt verbalized understanding of d/c instructions and has no further questions. Pt is stable, A&Ox4, VSS.  

## 2016-11-28 ENCOUNTER — Encounter: Payer: Self-pay | Admitting: Internal Medicine

## 2016-11-28 ENCOUNTER — Ambulatory Visit (INDEPENDENT_AMBULATORY_CARE_PROVIDER_SITE_OTHER): Payer: Medicaid Other | Admitting: Internal Medicine

## 2016-11-28 VITALS — BP 142/88 | HR 129 | Ht 62.0 in | Wt 172.6 lb

## 2016-11-28 DIAGNOSIS — R000 Tachycardia, unspecified: Secondary | ICD-10-CM

## 2016-11-28 MED ORDER — METOPROLOL SUCCINATE ER 25 MG PO TB24: 25.0000 mg | ORAL_TABLET | Freq: Every day | ORAL | 6 refills | Status: DC

## 2016-11-28 NOTE — Patient Instructions (Signed)
Your physician has recommended you make the following change in your medication:  1.( start Toprol XL 25 mg once a day  Your physician recommends that you schedule a follow-up appointment in: 3-4 months with Dr. Lovena Le.   Dr. Lovena Le recommends to stop caffeine, alcohol and increase fluids, sleep and exercise

## 2016-11-28 NOTE — Progress Notes (Signed)
HPI [name] is referred today by Dr. Nicola Girt for evaluation of tachycardia. She is a pleasant 26 yo woman with a h/o palpitations which have only been mildly symptomatic for the past 3-4 years. The patient had complications with her last pregnancy and since then has noted that her heart beats fast. She also notes dyspnea with exertion. She had problems with vaginal bleeding and underwent hysterectomy. She nows that when she sleeps on her left she feels chest pressure. She is anxious worried that her heart problems will result in a stroke like her mother recently had. She has been frustrated by her inability to lose weight. She admits to being sedentary. No chest pressure or pain with exertion. Allergies  Allergen Reactions  . Codeine Hives and Shortness Of Breath     Current Outpatient Prescriptions  Medication Sig Dispense Refill  . albuterol (PROAIR HFA) 108 (90 Base) MCG/ACT inhaler Inhale 2 puffs into the lungs every 6 (six) hours as needed for wheezing or shortness of breath.    . busPIRone (BUSPAR) 5 MG tablet Take 1 tablet (5 mg total) by mouth 3 (three) times daily. 90 tablet 2  . doxycycline (VIBRAMYCIN) 100 MG capsule Take 100 mg by mouth 2 (two) times daily.    Marland Kitchen ibuprofen (ADVIL,MOTRIN) 200 MG tablet Take 800 mg by mouth every 6 (six) hours as needed (pain).    . nortriptyline (PAMELOR) 25 MG capsule Take 75 mg by mouth at bedtime.    . pantoprazole (PROTONIX) 40 MG tablet Take 80 mg by mouth daily.    . SUMATRIPTAN SUCCINATE IJ Inject 1 each into the muscle once as needed (migraine).     No current facility-administered medications for this visit.      Past Medical History:  Diagnosis Date  . Anxiety   . Asthma   . Bipolar disorder ([place])    no current med.  . Depression    no current med.  . Family history of adverse reaction to anesthesia    states mother and sister are hard to wake up post-op  . Gestational diabetes   . History of seizure 06/20/2012   x 1 -  during delivery of child(eclampsia)  . Hypertension   . Lipoma of lower back 08/2015  . Migraine   . PTSD (post-traumatic stress disorder)   . PTSD (post-traumatic stress disorder)   . TMJ (dislocation of temporomandibular joint)   . Vertigo     ROS:   All systems reviewed and negative except as noted in the HPI.   Past Surgical History:  Procedure Laterality Date  . ABDOMINAL HYSTERECTOMY    . ADENOIDECTOMY, TONSILLECTOMY AND MYRINGOTOMY WITH TUBE PLACEMENT  1990's  . CESAREAN SECTION  06/20/2012   Procedure: CESAREAN SECTION;  Surgeon: Emily Filbert, MD;  Location: [facility];  Service: Obstetrics;  Laterality: N/A;  . LAPAROSCOPIC APPENDECTOMY  07/25/2012   Procedure: APPENDECTOMY LAPAROSCOPIC;  Surgeon: Zenovia Jarred, MD;  Location: [facility];  Service: General;  Laterality: N/A;  . LAPAROSCOPIC TUBAL LIGATION Bilateral 08/04/2014   Procedure: BILATERAL LAPAROSCOPIC TUBAL LIGATION;  Surgeon: Woodroe Mode, MD;  Location: [facility];  Service: Gynecology;  Laterality: Bilateral;  . LIPOMA EXCISION N/A 09/16/2015   Procedure: EXCISION LIPOMA LUMBAR REGION;  Surgeon: Donnie Mesa, MD;  Location: [facility];  Service: General;  Laterality: N/A;  . WISDOM TOOTH EXTRACTION       Family History  Problem Relation Age of Onset  . Breast cancer  Paternal Grandmother   . Colon cancer Paternal Grandfather   . Colon cancer Maternal Grandfather   . Colon polyps Maternal Aunt   . Diabetes Mother   . Anesthesia problems Mother        hard to wake up post-op  . Diabetes Sister   . Anesthesia problems Sister        hard to wake up post-op  . Diabetes Father   . Heart disease Father      Social History   Social History  . Marital status: Single    Spouse name: N/A  . Number of children: 2  . Years of education: N/A   Occupational History  . Not on file.   Social History Main Topics  . Smoking status: Current Every Day Smoker    Packs/day: 1.00    Years: 14.00    Types:  Cigarettes  . Smokeless tobacco: Current User     Comment: less than 1 pack/day  . Alcohol use No  . Drug use: No  . Sexual activity: Yes    Birth control/ protection: Surgical, None   Other Topics Concern  . Not on file   Social History Narrative   Lives with boyfriend and 3 children in a one story home.  Does not work.  Education: 9th grade.     BP (!) 142/88   Pulse (!) 129   Ht 5\' 2"  (1.575 m)   Wt 172 lb 9.6 oz (78.3 kg)   LMP 08/14/2016 (Exact Date)   SpO2 98%   BMI 31.57 kg/m   Physical Exam:  Well appearing 26 yo woman, NAD HEENT: Unremarkable Neck:  6 cm JVD, no thyromegally Lymphatics:  No adenopathy Back:  No CVA tenderness Lungs:  Clear HEART:  Regular rate rhythm, no murmurs, no rubs, no clicks Abd:  soft, positive bowel sounds, no organomegally, no rebound, no guarding Ext:  2 plus pulses, no edema, no cyanosis, no clubbing Skin:  No rashes no nodules Neuro:  CN II through XII intact, motor grossly intact  EKG - sinus tachycardia  DEVICE  Normal device function.  See PaceArt for details.   Assess/Plan: 1. Palpitations - she has sinus tachycardia but is not terribly symptomatic. I discussed the benign nature and have asked her to avoid caffeine, EtOH and any stimulants. I have asked her to start exercising, and we have given her a prescription for low dose toprol. 2. HTN - her blood pressure is up a bit and I have asked her to start toprol. 3. Anxiety - we discussed the importance of exercise and hopefully her symptoms will improve. If her HR does not come down, additional testing will be required. She does not have the appearance of someone with increased T4.   Mikle Bosworth.D.

## 2016-11-29 ENCOUNTER — Telehealth: Payer: Self-pay | Admitting: Internal Medicine

## 2016-11-29 NOTE — Telephone Encounter (Signed)
Pt called because she was seen by a cardiologist and prescribed a beta blocker. She is also suppose to be coming here for a thyroid test. She was told that the beta blocker could effect the results of her thyroid test and wanted to know should she stop taking the beta blocker so that she can have the thyroid test done. Please advise her on the best option. jw

## 2016-11-30 ENCOUNTER — Other Ambulatory Visit: Payer: Self-pay | Admitting: Internal Medicine

## 2016-11-30 ENCOUNTER — Other Ambulatory Visit: Payer: Medicaid Other

## 2016-11-30 DIAGNOSIS — R000 Tachycardia, unspecified: Secondary | ICD-10-CM

## 2016-11-30 NOTE — Telephone Encounter (Signed)
Spoke with patient on the phone. No need to stop beta blocker before having thyroid checked. Orders placed for TSH and T4.

## 2016-12-01 LAB — TSH: TSH: 1.23 u[IU]/mL (ref 0.450–4.500)

## 2016-12-01 LAB — T4, FREE: Free T4: 1.42 ng/dL (ref 0.82–1.77)

## 2016-12-06 ENCOUNTER — Ambulatory Visit: Payer: Medicaid Other | Admitting: Neurology

## 2016-12-13 ENCOUNTER — Encounter: Payer: Self-pay | Admitting: Internal Medicine

## 2016-12-13 ENCOUNTER — Ambulatory Visit (INDEPENDENT_AMBULATORY_CARE_PROVIDER_SITE_OTHER): Payer: Medicaid Other | Admitting: Internal Medicine

## 2016-12-13 VITALS — BP 110/84 | HR 108 | Temp 98.1°F | Ht 62.0 in | Wt 176.0 lb

## 2016-12-13 DIAGNOSIS — H9390 Unspecified disorder of ear, unspecified ear: Secondary | ICD-10-CM | POA: Diagnosis not present

## 2016-12-13 DIAGNOSIS — R635 Abnormal weight gain: Secondary | ICD-10-CM | POA: Diagnosis present

## 2016-12-13 NOTE — Patient Instructions (Signed)
It was so nice to see you!  Your weight gain may be related to the Buspar or the steroids that you were recently on. Don't give up! You should keep exercising and eating healthy. As you gain muscle, you may notice that you weigh more because muscle weighs more than fat.  I have sent in a referral to Dermatology. You should hear from our office in ~2 weeks to schedule an appointment.  -Dr. Brett Albino

## 2016-12-13 NOTE — Assessment & Plan Note (Signed)
Pt expresses frustration regarding 6lb weight gain in the last month. Discussed that this may be due a difference in scales between our office at the cardiologist's office. When looking at her weight over time, she does have some fluctuations, but it seems like her weight has overall been pretty stable. TSH and T4 were checked recently and were normal. She has been trying to eat healthy and is exercising 5 times per week. We also discussed that she could be putting on muscle since she has been doing lunges, crunches, and push-ups. Could also be related to the recent steroid course or possibly Buspar. - Continue eating lots of fruits and vegetables - Advised patient to continue to exercise for at least 30 minutes per day and to make sure that she is working hard enough to not be able to sustain a conversation.

## 2016-12-13 NOTE — Progress Notes (Signed)
   [facility] Phone: [phone]  Subjective:  Michelle [name] is a 26 year old female presenting to clinic with weight gain and bumps on her ears.  Weight Gain: Patient states she has gained 6lbs in the last month. She noticed that her weight went from 170lbs at the cardiologist's office to 176lbs in our office today. She states this is very frustrating for her because she has been trying to eat right and exercise regularly. She is eating 3-5 small meals per day. She is also only drinking water. She has also been exercising 5 times per day. She is walking for 30 minutes and doing a "12 week Mommy work out program", which consists of push-ups, crunches, and lunges. She was on a course of steroids within the last 30 days. She also recently started taking Buspar for anxiety.  Bumps on Ears: Has been going on for many years. Occurring on both ears. She states they are small hard bumps that come and go. They are located on her ear lobes and behind her ears. They have had to be drained in the past whenever they get really big. She is requesting a referral to dermatology. No recent fevers or chills.  ROS: See HPI for pertinent positives and negatives  Past Medical History- migraines, bipolar disorder, PTSD, anxiety  Family history reviewed for today's visit. No changes.  Social history- patient is a current smoker  Objective: BP 110/84   Pulse (!) 108   Temp 98.1 F (36.7 C) (Oral)   Ht 5\' 2"  (1.575 m)   Wt 176 lb (79.8 kg)   LMP 08/14/2016 (Exact Date)   SpO2 95%   BMI 32.19 kg/m  Gen: NAD, alert, cooperative with exam HEENT: NCAT, EOMI, MMM, small round nodules present on the ear lobes and behind the ears. No fluctuance, no surrounding erythema, no drainage. Neck: FROM, supple, no cervical lymphadenopathy CV: Tachycardic, regular rhythm, no murmurs Resp: CTABL, no wheezes, normal work of breathing Psych: Appropriate behavior, normal rate of  speech  Assessment/Plan: Weight Gain: Pt expresses frustration regarding 6lb weight gain in the last month. Discussed that this may be due a difference in scales between our office at the cardiologist's office. When looking at her weight over time, she does have some fluctuations, but it seems like her weight has overall been pretty stable. TSH and T4 were checked recently and were normal. She has been trying to eat healthy and is exercising 5 times per week. We also discussed that she could be putting on muscle since she has been doing lunges, crunches, and push-ups. Could also be related to the recent steroid course or possibly Buspar. - Continue eating lots of fruits and vegetables - Advised patient to continue to exercise for at least 30 minutes per day and to make sure that she is working hard enough to not be able to sustain a conversation.  Bumps on ears: Has been going on for years. They are benign-appearing. 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 Patient requesting referral to dermatology. - Referral placed to dermatology - Follow-up as needed    Hyman Bible, MD PGY-2

## 2016-12-13 NOTE — Assessment & Plan Note (Signed)
Has been going on for years. They are benign-appearing. 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 Patient requesting referral to dermatology. - Referral placed to dermatology - Follow-up as needed

## 2017-01-02 DIAGNOSIS — L720 Epidermal cyst: Secondary | ICD-10-CM | POA: Diagnosis not present

## 2017-01-02 DIAGNOSIS — L700 Acne vulgaris: Secondary | ICD-10-CM | POA: Diagnosis not present

## 2017-01-19 ENCOUNTER — Other Ambulatory Visit: Payer: Self-pay | Admitting: Internal Medicine

## 2017-02-26 ENCOUNTER — Ambulatory Visit (INDEPENDENT_AMBULATORY_CARE_PROVIDER_SITE_OTHER): Payer: Medicaid Other | Admitting: Neurology

## 2017-02-26 ENCOUNTER — Encounter: Payer: Self-pay | Admitting: Neurology

## 2017-02-26 VITALS — BP 114/72 | HR 115 | Ht 62.0 in | Wt 167.0 lb

## 2017-02-26 DIAGNOSIS — G43009 Migraine without aura, not intractable, without status migrainosus: Secondary | ICD-10-CM

## 2017-02-26 MED ORDER — NORTRIPTYLINE HCL 25 MG PO CAPS: 75.0000 mg | ORAL_CAPSULE | Freq: Every day | ORAL | 11 refills | Status: DC

## 2017-02-26 MED ORDER — SUMATRIPTAN SUCCINATE 3 MG/0.5ML ~~LOC~~ SOAJ: 1.0000 | Freq: Once | SUBCUTANEOUS | 5 refills | Status: DC

## 2017-02-26 NOTE — Progress Notes (Signed)
NEUROLOGY FOLLOW UP OFFICE NOTE  Michelle [name] [id number]  HISTORY OF PRESENT ILLNESS: I had the pleasure of seeing Michelle [name] in follow-up in the neurology clinic on 02/26/2017.  The patient was last seen 3 months ago for new daily persistent headaches.MRI brain without contrast normal. She had an initial good response to nortriptyline, then complained of migraines every few days after hysterectomy in February. Nortriptyline dose was increased to 75mg  qhs. She was also reporting anxiety and was started on Buspar. She is not taking Buspar any longer, and reports anxiety is better now that she has moved out of her mother's house. She is happy to report migraines occur less than once a month. She had good response to Zembrace (sumatriptan injection), in the past oral sumatriptan did not help. She denies any dizziness, diplopia, focal numbness/tingling/weakness, no falls.   HPI 03/06/16: This is a 26 yo RH woman with a history of bipolar disorder, PTSD, hypertension, who presented with new onset headaches since June 2016. 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 They were so bad that she was shaking on the floor and felt like she would pass out. She went to the ER on 01/06/16 and reports having daily headaches that wax and wane in intensity since then. She would wake up with a headache then towards the evening they become more severe 10/10 with throbbing pain and nausea. She occasionally sees black dots in her vision. No positional component. She stopped Depo-Provera last week. No photo/phonophobia, tinnitus, focal numbness/tingling/weakness, dizziness, diplopia, dysarthria, dysphagia, bowel/bladder dysfunction. She has had neck pains as well. She has chronic back pain.   She has been dealing with significant stomach cramps and will be seeing  a specialist regarding concern for endometriosis. She has been taking Tramadol and Percocet daily for the cramps, and has prn Fioricet for the headaches, taking 2-3 daily for the past 2 months. She gets 8-9 hours of sleep but still feels exhausted (for the past couple of years). She denies snoring, she has daytime drowsiness. She was given Reglan as well in the ER and takes this daily. There is a history of one seizure during delivery of her third child in 2013. No family history of migraines.   PAST MEDICAL HISTORY: Past Medical History:  Diagnosis Date  . Anxiety   . Asthma   . Bipolar disorder ([place])    no current med.  . Depression    no current med.  . Family history of adverse reaction to anesthesia    states mother and sister are hard to wake up post-op  . Gestational diabetes   . History of seizure 06/20/2012   x 1 - during delivery of child(eclampsia)  . Hypertension   . Lipoma of lower back 08/2015  . Migraine   . PTSD (post-traumatic stress disorder)   . PTSD (post-traumatic stress disorder)   . TMJ (dislocation of temporomandibular joint)   . Vertigo     MEDICATIONS: Current Outpatient Prescriptions on File Prior to Visit  Medication Sig Dispense Refill  . albuterol (PROAIR HFA) 108 (90 Base) MCG/ACT inhaler Inhale 2 puffs into the lungs every 6 (six) hours as needed for wheezing or shortness of breath.    Marland Kitchen ibuprofen (ADVIL,MOTRIN) 200 MG tablet Take 800 mg by mouth every 6 (six) hours as needed (pain).    Marland Kitchen  metoprolol succinate (TOPROL XL) 25 MG 24 hr tablet Take 1 tablet (25 mg total) by mouth daily. 30 tablet 6  . nortriptyline (PAMELOR) 25 MG capsule Take 75 mg by mouth at bedtime.    . pantoprazole (PROTONIX) 40 MG tablet TAKE 2 TABLETS(80 MG) BY MOUTH DAILY 120 tablet 0  . SUMATRIPTAN SUCCINATE IJ Inject 1 each into the muscle once as needed (migraine).     No current facility-administered medications on file prior to visit.     ALLERGIES: Allergies  Allergen  Reactions  . Codeine Hives and Shortness Of Breath    FAMILY HISTORY: Family History  Problem Relation Age of Onset  . Breast cancer Paternal Grandmother   . Colon cancer Paternal Grandfather   . Colon cancer Maternal Grandfather   . Colon polyps Maternal Aunt   . Diabetes Mother   . Anesthesia problems Mother        hard to wake up post-op  . Diabetes Sister   . Anesthesia problems Sister        hard to wake up post-op  . Diabetes Father   . Heart disease Father     SOCIAL HISTORY: Social History   Social History  . Marital status: Single    Spouse name: N/A  . Number of children: 2  . Years of education: N/A   Occupational History  . Not on file.   Social History Main Topics  . Smoking status: Current Every Day Smoker    Packs/day: 0.50    Years: 14.00    Types: Cigarettes  . Smokeless tobacco: Current User  . Alcohol use No  . Drug use: No  . Sexual activity: Yes    Birth control/ protection: Surgical, None   Other Topics Concern  . Not on file   Social History Narrative   Lives with boyfriend and 3 children in a one story home.  Does not work.  Education: 9th grade.    REVIEW OF SYSTEMS: Constitutional: No fevers, chills, or sweats, no generalized fatigue, change in appetite Eyes: No visual changes, double vision, eye pain Ear, nose and throat: No hearing loss, ear pain, nasal congestion, sore throat Cardiovascular: No chest pain, palpitations Respiratory:  No shortness of breath at rest or with exertion, wheezes GastrointestinaI: No nausea, vomiting, diarrhea, abdominal pain, fecal incontinence Genitourinary:  No dysuria, urinary retention or frequency Musculoskeletal:  No neck pain, back pain Integumentary: No rash, pruritus, skin lesions Neurological: as above Psychiatric: + depression, insomnia, anxiety Endocrine: No palpitations, fatigue, diaphoresis, mood swings, change in appetite, change in weight, increased thirst Hematologic/Lymphatic:  No  anemia, purpura, petechiae. Allergic/Immunologic: no itchy/runny eyes, nasal congestion, recent allergic reactions, rashes  PHYSICAL EXAM: Vitals:   02/26/17 0823  BP: 114/72  Pulse: (!) 115  SpO2: 96%   General: No acute distress Head:  Normocephalic/atraumatic Neck: supple, no paraspinal tenderness, full range of motion Heart:  Regular rate and rhythm Lungs:  Clear to auscultation bilaterally Back: No paraspinal tenderness Skin/Extremities: No rash, no edema Neurological Exam: alert and oriented to person, place, and time. No aphasia or dysarthria. Fund of knowledge is appropriate.  Recent and remote memory are intact.  Attention and concentration are normal.    Able to name objects and repeat phrases. Cranial nerves: Pupils equal, round, reactive to light.  Fundoscopic exam unremarkable, no papilledema. Extraocular movements intact with no nystagmus. Visual fields full. Facial sensation intact. No facial asymmetry. Tongue, uvula, palate midline.  Motor: Bulk and tone normal, muscle strength 5/5  throughout with no pronator drift.  Sensation to light touch intact.  No extinction to double simultaneous stimulation.  Deep tendon reflexes 2+ throughout, toes downgoing.  Finger to nose testing intact.  Gait narrow-based and steady, able to tandem walk adequately.  Romberg negative.  IMPRESSION: This is a 26 yo RH woman with a history of bipolar disorder, PTSD, hypertension, who presented for new onset daily headaches with migranous features. MRI brain normal. She has had a good response to nortriptyline higher dose, currently on 75mg  qhs with migraines less than once a month. She had good response to sumatriptan injection, refills will be sent. She will follow-up in 6 months and knows to call for any changes.   Thank you for allowing me to participate in her care.  Please do not hesitate to call for any questions or concerns.  The duration of this appointment visit was 15 minutes of  face-to-face time with the patient.  Greater than 50% of this time was spent in counseling, explanation of diagnosis, planning of further management, and coordination of care.   Ellouise Newer, M.D.   CC: Dr. Brett Albino

## 2017-02-26 NOTE — Patient Instructions (Addendum)
1. Continue nortriptyline 75mg  : take 3 caps at night 2. Use sumatriptan injection at onset of migraine, do not use more than 3 times a week 3. Follow-up in 6 months, call for any changes

## 2017-03-06 ENCOUNTER — Telehealth: Payer: Self-pay | Admitting: Neurology

## 2017-03-06 ENCOUNTER — Ambulatory Visit: Payer: Medicaid Other | Admitting: Internal Medicine

## 2017-03-06 NOTE — Telephone Encounter (Signed)
Patient called needing to let you know that her Sumatriptan Injectables were not covered by [employer]. Please Advise. Thanks

## 2017-03-18 ENCOUNTER — Other Ambulatory Visit: Payer: Self-pay | Admitting: Internal Medicine

## 2017-03-19 ENCOUNTER — Other Ambulatory Visit: Payer: Self-pay | Admitting: Certified Nurse Midwife

## 2017-03-19 DIAGNOSIS — G43719 Chronic migraine without aura, intractable, without status migrainosus: Secondary | ICD-10-CM

## 2017-03-20 ENCOUNTER — Telehealth: Payer: Self-pay | Admitting: Neurology

## 2017-03-20 NOTE — Telephone Encounter (Signed)
Responded via Mychart.

## 2017-03-20 NOTE — Telephone Encounter (Signed)
She wants to know if we have heard anything back about the injectables

## 2017-03-21 ENCOUNTER — Other Ambulatory Visit: Payer: Self-pay

## 2017-03-21 DIAGNOSIS — G43909 Migraine, unspecified, not intractable, without status migrainosus: Secondary | ICD-10-CM

## 2017-03-24 ENCOUNTER — Other Ambulatory Visit: Payer: Self-pay | Admitting: Internal Medicine

## 2017-03-27 ENCOUNTER — Other Ambulatory Visit: Payer: Self-pay

## 2017-04-09 ENCOUNTER — Ambulatory Visit: Payer: Medicaid Other | Admitting: Internal Medicine

## 2017-04-16 NOTE — Progress Notes (Deleted)
Cardiology Office Note Date:  04/16/2017  Patient ID:  Michelle, [name] Jan 11, 1991, MRN [id number] PCP:  Sela Hua, MD  Cardiologist:  ***  ***refresh   Chief Complaint: planned f/u  History of Present Illness: Michelle [name] is a 26 y.o. female with history of HTN, anxiety/depression, Bipolar Disorder, PTSD, asthma.   She comes today to be seen for Dr. Lovena Le, last seen by him in consultation for tachycardia in May, at that time mentioning palpitations over 3-4 years, after a complicated pregnancy, menorrhagia requiring hysterectomy, more recently increased c/o with her mother's recent stroke, she was rx low dose toprol for her palpitations and better BP control    *** symptoms *** BB *** meds *** life style *** monitor?  Past Medical History:  Diagnosis Date  . Anxiety   . Asthma   . Bipolar disorder ([place])    no current med.  . Depression    no current med.  . Family history of adverse reaction to anesthesia    states mother and sister are hard to wake up post-op  . Gestational diabetes   . History of seizure 06/20/2012   x 1 - during delivery of child(eclampsia)  . Hypertension   . Lipoma of lower back 08/2015  . Migraine   . PTSD (post-traumatic stress disorder)   . PTSD (post-traumatic stress disorder)   . TMJ (dislocation of temporomandibular joint)   . Vertigo     Past Surgical History:  Procedure Laterality Date  . ABDOMINAL HYSTERECTOMY    . ADENOIDECTOMY, TONSILLECTOMY AND MYRINGOTOMY WITH TUBE PLACEMENT  1990's  . CESAREAN SECTION  06/20/2012   Procedure: CESAREAN SECTION;  Surgeon: Emily Filbert, MD;  Location: [facility];  Service: Obstetrics;  Laterality: N/A;  . LAPAROSCOPIC APPENDECTOMY  07/25/2012   Procedure: APPENDECTOMY LAPAROSCOPIC;  Surgeon: Zenovia Jarred, MD;  Location: [facility];  Service: General;  Laterality: N/A;  . LAPAROSCOPIC TUBAL LIGATION Bilateral 08/04/2014   Procedure: BILATERAL LAPAROSCOPIC TUBAL LIGATION;  Surgeon: Woodroe Mode, MD;  Location: [facility];  Service: Gynecology;  Laterality: Bilateral;  . LIPOMA EXCISION N/A 09/16/2015   Procedure: EXCISION LIPOMA LUMBAR REGION;  Surgeon: Donnie Mesa, MD;  Location: [facility];  Service: General;  Laterality: N/A;  . WISDOM TOOTH EXTRACTION      Current Outpatient Prescriptions  Medication Sig Dispense Refill  . albuterol (PROAIR HFA) 108 (90 Base) MCG/ACT inhaler Inhale 2 puffs into the lungs every 6 (six) hours as needed for wheezing or shortness of breath.    . eletriptan (RELPAX) 20 MG tablet TAKE 1 TABLET BY MOUTH AS NEEDED FOR MIGRAINE OR HEADACHE. MAY REPEAT IN 2 HOURS IF HEADACHE PERSISTS OR RECURS 10 tablet 0  . ibuprofen (ADVIL,MOTRIN) 200 MG tablet Take 800 mg by mouth every 6 (six) hours as needed (pain).    . metoprolol succinate (TOPROL XL) 25 MG 24 hr tablet Take 1 tablet (25 mg total) by mouth daily. 30 tablet 6  . nortriptyline (PAMELOR) 25 MG capsule Take 3 capsules (75 mg total) by mouth at bedtime. 90 capsule 11  . pantoprazole (PROTONIX) 40 MG tablet TAKE 2 TABLETS(80 MG) BY MOUTH DAILY 120 tablet 0  . PROAIR HFA 108 (90 Base) MCG/ACT inhaler INHALE 2 PUFFS INTO THE LUNGS EVERY 4 HOURS AS NEEDED FOR WHEEZING OR SHORTNESS OF BREATH 8.5 g 0  . SUMAtriptan Succinate (ZEMBRACE SYMTOUCH) 3 MG/0.5ML SOAJ Inject 1 Dose into the skin once. Inject one dose as needed  at onset of migraine. Do not use more than 3 times a week. 9 pen 5  . SUMATRIPTAN SUCCINATE IJ Inject 1 each into the muscle once as needed (migraine).     No current facility-administered medications for this visit.     Allergies:   Codeine   Social History:  The patient  reports that she has been smoking Cigarettes.  She has a 7.00 pack-year smoking history. She uses smokeless tobacco. She reports that she does not drink alcohol or use drugs.   Family History:  The patient's family history includes Anesthesia problems in her mother and sister; Breast cancer in her paternal  grandmother; Colon cancer in her maternal grandfather and paternal grandfather; Colon polyps in her maternal aunt; Diabetes in her father, mother, and sister; Heart disease in her father.  ROS:  Please see the history of present illness.   All other systems are reviewed and otherwise negative.   PHYSICAL EXAM: *** VS:  LMP 08/14/2016 (Exact Date)  BMI: There is no height or weight on file to calculate BMI. Well nourished, well developed, in no acute distress  HEENT: normocephalic, atraumatic  Neck: no JVD, carotid bruits or masses Cardiac: *** RRR; no significant murmurs, no rubs, or gallops Lungs:  *** CTA b/l, no wheezing, rhonchi or rales  Abd: soft, nontender MS: no deformity or *** atrophy Ext: *** no edema  Skin: warm and dry, no rash Neuro:  No gross deficits appreciated Psych: euthymic mood, full affect   EKG:   ***  01/24/16: TTE Study Conclusions - Left ventricle: The cavity size was normal. Systolic function was   vigorous. The estimated ejection fraction was in the range of 65%   to 70%. Wall motion was normal; there were no regional wall   motion abnormalities. Due to tachycardia, there was fusion of   early and atrial contributions to ventricular filling  Recent Labs: 07/28/2016: ALT 23 11/20/2016: BUN 9; Creatinine, Ser 0.70; Hemoglobin 14.0; Platelets 307; Potassium 4.0; Sodium 137 11/30/2016: TSH 1.230  No results found for requested labs within last 8760 hours.   CrCl cannot be calculated (Patient's most recent lab result is older than the maximum 21 days allowed.).   Wt Readings from Last 3 Encounters:  02/26/17 167 lb (75.8 kg)  12/13/16 176 lb (79.8 kg)  11/28/16 172 lb 9.6 oz (78.3 kg)     Other studies reviewed: Additional studies/records reviewed today include: summarized above  ASSESSMENT AND PLAN:  1. Palpitations     ***  2. HTN     ***  Disposition: F/u with ***  Current medicines are reviewed at length with the patient today.  The  patient did not have any concerns regarding medicines.***  Signed, Tommye Standard, PA-C 04/16/2017 7:15 PM     [facility] [place]  [address] [phone] (office)  [phone] (fax)

## 2017-04-17 ENCOUNTER — Ambulatory Visit (INDEPENDENT_AMBULATORY_CARE_PROVIDER_SITE_OTHER): Payer: Medicaid Other

## 2017-04-17 DIAGNOSIS — Z23 Encounter for immunization: Secondary | ICD-10-CM

## 2017-04-18 ENCOUNTER — Ambulatory Visit: Payer: Medicaid Other | Admitting: Physician Assistant

## 2017-04-19 ENCOUNTER — Encounter: Payer: Self-pay | Admitting: Physician Assistant

## 2017-05-22 ENCOUNTER — Encounter: Payer: Self-pay | Admitting: Internal Medicine

## 2017-06-12 ENCOUNTER — Other Ambulatory Visit: Payer: Self-pay | Admitting: Internal Medicine

## 2017-06-26 ENCOUNTER — Emergency Department (HOSPITAL_COMMUNITY)
Admission: EM | Admit: 2017-06-26 | Discharge: 2017-06-27 | Disposition: A | Discharge status: Home / Self Care | Payer: Medicaid Other | Attending: Emergency Medicine | Admitting: Emergency Medicine

## 2017-06-26 ENCOUNTER — Encounter (HOSPITAL_COMMUNITY): Payer: Self-pay | Admitting: Emergency Medicine

## 2017-06-26 ENCOUNTER — Ambulatory Visit (HOSPITAL_COMMUNITY)
Admission: EM | Admit: 2017-06-26 | Discharge: 2017-06-26 | Disposition: A | Discharge status: Home / Self Care | Payer: Medicaid Other | Attending: Internal Medicine | Admitting: Internal Medicine

## 2017-06-26 DIAGNOSIS — Z791 Long term (current) use of non-steroidal anti-inflammatories (NSAID): Secondary | ICD-10-CM | POA: Insufficient documentation

## 2017-06-26 DIAGNOSIS — K5904 Chronic idiopathic constipation: Secondary | ICD-10-CM | POA: Diagnosis not present

## 2017-06-26 DIAGNOSIS — R197 Diarrhea, unspecified: Secondary | ICD-10-CM | POA: Insufficient documentation

## 2017-06-26 DIAGNOSIS — A039 Shigellosis, unspecified: Secondary | ICD-10-CM

## 2017-06-26 DIAGNOSIS — J45909 Unspecified asthma, uncomplicated: Secondary | ICD-10-CM | POA: Insufficient documentation

## 2017-06-26 DIAGNOSIS — R195 Other fecal abnormalities: Secondary | ICD-10-CM | POA: Diagnosis present

## 2017-06-26 DIAGNOSIS — F17210 Nicotine dependence, cigarettes, uncomplicated: Secondary | ICD-10-CM | POA: Diagnosis not present

## 2017-06-26 DIAGNOSIS — Z79899 Other long term (current) drug therapy: Secondary | ICD-10-CM | POA: Insufficient documentation

## 2017-06-26 DIAGNOSIS — I10 Essential (primary) hypertension: Secondary | ICD-10-CM | POA: Diagnosis not present

## 2017-06-26 LAB — COMPREHENSIVE METABOLIC PANEL
ALT: 34 U/L (ref 14–54)
AST: 27 U/L (ref 15–41)
Albumin: 4.1 g/dL (ref 3.5–5.0)
Alkaline Phosphatase: 78 U/L (ref 38–126)
Anion gap: 9 (ref 5–15)
BUN: 5 mg/dL — ABNORMAL LOW (ref 6–20)
CO2: 23 mmol/L (ref 22–32)
Calcium: 9.1 mg/dL (ref 8.9–10.3)
Chloride: 103 mmol/L (ref 101–111)
Creatinine, Ser: 0.88 mg/dL (ref 0.44–1.00)
GFR calc Af Amer: >60 mL/min (ref >60)
GFR calc non Af Amer: >60 mL/min (ref >60)
Glucose, Bld: 126 mg/dL — ABNORMAL HIGH (ref 65–99)
Potassium: 3.4 mmol/L — ABNORMAL LOW (ref 3.5–5.1)
Sodium: 135 mmol/L (ref 135–145)
Total Bilirubin: 0.3 mg/dL (ref 0.3–1.2)
Total Protein: 7.4 g/dL (ref 6.5–8.1)

## 2017-06-26 LAB — CBC
HCT: 43 % (ref 36.0–46.0)
Hemoglobin: 14.8 g/dL (ref 12.0–15.0)
MCH: 31.2 pg (ref 26.0–34.0)
MCHC: 34.4 g/dL (ref 30.0–36.0)
MCV: 90.5 fL (ref 78.0–100.0)
Platelets: 269 10*3/uL (ref 150–400)
RBC: 4.75 MIL/uL (ref 3.87–5.11)
RDW: 12 % (ref 11.5–15.5)
WBC: 15.2 10*3/uL — ABNORMAL HIGH (ref 4.0–10.5)

## 2017-06-26 LAB — TYPE AND SCREEN
ABO/RH(D): O POS
Antibody Screen: NEGATIVE

## 2017-06-26 LAB — I-STAT BETA HCG BLOOD, ED (MC, WL, AP ONLY): I-stat hCG, quantitative: <5 m[IU]/mL (ref <5)

## 2017-06-26 LAB — ABO/RH: ABO/RH(D): O POS

## 2017-06-26 MED ORDER — LACTULOSE 10 GM/15ML PO SOLN: 10.0000 g | Freq: Three times a day (TID) | ORAL | 0 refills | Status: DC

## 2017-06-26 MED ORDER — CIPROFLOXACIN HCL 500 MG PO TABS: 500.0000 mg | ORAL_TABLET | Freq: Two times a day (BID) | ORAL | 0 refills | Status: DC

## 2017-06-26 MED ORDER — METRONIDAZOLE 500 MG PO TABS: 500.0000 mg | ORAL_TABLET | Freq: Three times a day (TID) | ORAL | 0 refills | Status: DC

## 2017-06-26 NOTE — ED Triage Notes (Addendum)
Pt here for lower abd pain and some rectal bleeding with bloody mucous in BM noticed today per pt; pt appears anxious

## 2017-06-26 NOTE — ED Triage Notes (Signed)
Pt reports multiple bloody stools today, reports bright red blood and abdominal pain since 5AM. Also reporting nausea.

## 2017-06-26 NOTE — ED Provider Notes (Signed)
[facility]    CSN: [id number] Arrival date & time: 06/26/17  1232     History   Chief Complaint Chief Complaint  Patient presents with  . Abdominal Pain    HPI Michelle [name] is a 26 y.o. female.   Abdominal pain that started this morning. Patient states that it feels like constipation but stool is all liquid. Some streaking of blood but also a few small clots. States abdominal pain is like cramping while she is trying to have a bowel movement. Denies fevers, rashes or bruising. Admits to not eating well but this is chronic long before she ever began to have abdominal pain. No FHx of IBD; no sick contacts at home. Denies N/V.       Past Medical History:  Diagnosis Date  . Anxiety   . Asthma   . Bipolar disorder ([place])    no current med.  . Depression    no current med.  . Family history of adverse reaction to anesthesia    states mother and sister are hard to wake up post-op  . Gestational diabetes   . History of seizure 06/20/2012   x 1 - during delivery of child(eclampsia)  . Hypertension   . Lipoma of lower back 08/2015  . Migraine   . PTSD (post-traumatic stress disorder)   . PTSD (post-traumatic stress disorder)   . TMJ (dislocation of temporomandibular joint)   . Vertigo     Patient Active Problem List   Diagnosis Date Noted  . Weight gain 12/13/2016  . Ear lesion 12/13/2016  . Anxiety state 10/27/2016  . Migraine 02/10/2016  . PTSD 03/02/2010  . Tobacco use disorder 02/07/2008  . PAP SMEAR, LGSIL, ABNORMAL 11/27/2006  . 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 ABDOMINAL HYSTERECTOMY    . ADENOIDECTOMY, TONSILLECTOMY AND MYRINGOTOMY WITH TUBE PLACEMENT  1990's  . CESAREAN SECTION  06/20/2012   Procedure: CESAREAN SECTION;  Surgeon: Emily Filbert, MD;  Location: [facility];  Service: Obstetrics;  Laterality: N/A;  . LAPAROSCOPIC APPENDECTOMY  07/25/2012   Procedure: APPENDECTOMY  LAPAROSCOPIC;  Surgeon: Zenovia Jarred, MD;  Location: [facility];  Service: General;  Laterality: N/A;  . LAPAROSCOPIC TUBAL LIGATION Bilateral 08/04/2014   Procedure: BILATERAL LAPAROSCOPIC TUBAL LIGATION;  Surgeon: Woodroe Mode, MD;  Location: [facility];  Service: Gynecology;  Laterality: Bilateral;  . LIPOMA EXCISION N/A 09/16/2015   Procedure: EXCISION LIPOMA LUMBAR REGION;  Surgeon: Donnie Mesa, MD;  Location: [facility];  Service: General;  Laterality: N/A;  . WISDOM TOOTH EXTRACTION      OB History    Gravida Para Term Preterm AB Living   3 3 2 1  0 3   SAB TAB Ectopic Multiple Live Births   0 0 0 0 1      Obstetric Comments   C-Section Indication: Non-reassuring fetal tracing, growth delay & marked proteinuria & Pre-Eclampsia Eclamptic Seizure during C-section delivery       Home Medications    Prior to Admission medications   Medication Sig Start Date End Date Taking? Authorizing Provider  albuterol (PROAIR HFA) 108 (90 Base) MCG/ACT inhaler Inhale 2 puffs into the lungs every 6 (six) hours as needed for wheezing or shortness of breath.    [provider]  ciprofloxacin (CIPRO) 500 MG tablet Take 1 tablet (500 mg total) by mouth 2 (two) times daily for 7 days. 06/26/17 07/03/17  Harrie Foreman, MD  eletriptan (RELPAX) 20 MG tablet TAKE 1 TABLET BY MOUTH AS NEEDED FOR MIGRAINE OR HEADACHE. MAY REPEAT IN 2 HOURS IF HEADACHE PERSISTS OR RECURS 03/19/17   Shelly Bombard, MD  ibuprofen (ADVIL,MOTRIN) 200 MG tablet Take 800 mg by mouth every 6 (six) hours as needed (pain).    [provider]  lactulose (CHRONULAC) 10 GM/15ML solution Take 15 mLs (10 g total) by mouth 3 (three) times daily. 06/26/17   Harrie Foreman, MD  metoprolol succinate (TOPROL XL) 25 MG 24 hr tablet Take 1 tablet (25 mg total) by mouth daily. 11/28/16   Evans Lance, MD  metroNIDAZOLE (FLAGYL) 500 MG tablet Take 1 tablet (500 mg total) by mouth 3 (three) times daily.  06/26/17   Harrie Foreman, MD  nortriptyline (PAMELOR) 25 MG capsule Take 3 capsules (75 mg total) by mouth at bedtime. 02/26/17   Cameron Sprang, MD  pantoprazole (PROTONIX) 40 MG tablet TAKE 2 TABLETS(80 MG) BY MOUTH DAILY 06/12/17   Mayo, Pete Pelt, MD  PROAIR HFA 108 [phone] Base) MCG/ACT inhaler INHALE 2 PUFFS INTO THE LUNGS EVERY 4 HOURS AS NEEDED FOR WHEEZING OR SHORTNESS OF BREATH 03/26/17   Mayo, Pete Pelt, MD  SUMAtriptan Succinate (ZEMBRACE SYMTOUCH) 3 MG/0.5ML SOAJ Inject 1 Dose into the skin once. Inject one dose as needed at onset of migraine. Do not use more than 3 times a week. 02/26/17 02/26/17  Cameron Sprang, MD  SUMATRIPTAN SUCCINATE IJ Inject 1 each into the muscle once as needed (migraine).    [provider]    Family History Family History  Problem Relation Age of Onset  . Breast cancer Paternal Grandmother   . Colon cancer Paternal Grandfather   . Colon cancer Maternal Grandfather   . Colon polyps Maternal Aunt   . Diabetes Mother   . Anesthesia problems Mother        hard to wake up post-op  . Diabetes Sister   . Anesthesia problems Sister        hard to wake up post-op  . Diabetes Father   . Heart disease Father     Social History Social History   Tobacco Use  . Smoking status: Current Every Day Smoker    Packs/day: 0.50    Years: 14.00    Pack years: 7.00    Types: Cigarettes  . Smokeless tobacco: Current User  Substance Use Topics  . Alcohol use: No    Alcohol/week: 0.0 oz  . Drug use: No     Allergies   Codeine   Review of Systems Review of Systems  Constitutional: Negative for chills and fever.  HENT: Negative for sore throat and tinnitus.   Eyes: Negative for redness.  Respiratory: Negative for cough and shortness of breath.   Cardiovascular: Negative for chest pain and palpitations.  Gastrointestinal: Positive for abdominal pain. Negative for diarrhea, nausea and vomiting.  Genitourinary: Negative for dysuria, frequency and  urgency.  Musculoskeletal: Negative for myalgias.  Skin: Negative for rash.       No lesions  Neurological: Negative for weakness.  Hematological: Does not bruise/bleed easily.  Psychiatric/Behavioral: Negative for suicidal ideas.     Physical Exam Triage Vital Signs ED Triage Vitals [06/26/17 1245]  Enc Vitals Group     BP 123/86     Pulse Rate (!) 132     Resp 18     Temp 99.1 F (37.3 C)     Temp Source  Oral     SpO2 100 %     Weight      Height      Head Circumference      Peak Flow      Pain Score      Pain Loc      Pain Edu?      Excl. in [place]?    No data found.  Updated Vital Signs BP 123/86 (BP Location: Right Arm)   Pulse (!) 132   Temp 99.1 F (37.3 C) (Oral)   Resp 18   LMP 08/14/2016 (Exact Date)   SpO2 100%   Visual Acuity Right Eye Distance:   Left Eye Distance:   Bilateral Distance:    Right Eye Near:   Left Eye Near:    Bilateral Near:     Physical Exam  Constitutional: She is oriented to person, place, and time. She appears well-developed and well-nourished. No distress.  HENT:  Head: Normocephalic and atraumatic.  Mouth/Throat: Oropharynx is clear and moist.  Eyes: Conjunctivae and EOM are normal. Pupils are equal, round, and reactive to light. No scleral icterus.  Neck: Normal range of motion. Neck supple. No JVD present. No tracheal deviation present. No thyromegaly present.  Cardiovascular: Normal rate, regular rhythm and normal heart sounds. Exam reveals no gallop and no friction rub.  No murmur heard. Pulmonary/Chest: Effort normal and breath sounds normal.  Abdominal: Soft. Bowel sounds are normal. She exhibits no distension. There is no tenderness.  Musculoskeletal: Normal range of motion. She exhibits no edema.  Lymphadenopathy:    She has no cervical adenopathy.  Neurological: She is alert and oriented to person, place, and time. No cranial nerve deficit.  Skin: Skin is warm and dry.  Psychiatric: She has a normal mood and  affect. Her behavior is normal. Judgment and thought content normal.  Nursing note and vitals reviewed.    UC Treatments / Results  Labs (all labs ordered are listed, but only abnormal results are displayed) Labs Reviewed - No data to display  EKG  EKG Interpretation None       Radiology No results found.  Procedures Procedures (including critical care time)  Medications Ordered in UC Medications - No data to display   Initial Impression / Assessment and Plan / UC Course  I have reviewed the triage vital signs and the nursing notes.  Pertinent labs & imaging results that were available during my care of the patient were reviewed by me and considered in my medical decision making (see chart for details).     Likely fuctional abdominal pain +/- constipation. High degree of anxiety which contributes. The patient is concerned that she may have diverticulitis but this is highly unlikely given age and physical exam. Reassured. Given Rx for Cipro and Flagyl IF symptoms persist after she is more regular.   Final Clinical Impressions(s) / UC Diagnoses   Final diagnoses:  Chronic idiopathic constipation    ED Discharge Orders        Ordered    metroNIDAZOLE (FLAGYL) 500 MG tablet  3 times daily     06/26/17 1413    ciprofloxacin (CIPRO) 500 MG tablet  2 times daily     06/26/17 1413    lactulose (CHRONULAC) 10 GM/15ML solution  3 times daily     06/26/17 1418       Controlled Substance Prescriptions [place] Controlled Substance Registry consulted? Not Applicable   Harrie Foreman, MD 06/26/17 1425

## 2017-06-27 MED ORDER — SODIUM CHLORIDE 0.9 % IV BOLUS (SEPSIS)
1000.0000 mL | Freq: Once | INTRAVENOUS | Status: AC
  Administered 2017-06-27: 1000 mL via INTRAVENOUS

## 2017-06-27 MED ORDER — ONDANSETRON HCL 4 MG/2ML IJ SOLN
4.0000 mg | Freq: Once | INTRAMUSCULAR | Status: AC
  Administered 2017-06-27: 4 mg via INTRAVENOUS
  Filled 2017-06-27: qty 2

## 2017-06-27 MED ORDER — CIPROFLOXACIN IN D5W 400 MG/200ML IV SOLN
400.0000 mg | Freq: Once | INTRAVENOUS | Status: AC
  Administered 2017-06-27: 400 mg via INTRAVENOUS
  Filled 2017-06-27: qty 200

## 2017-06-27 MED ORDER — CIPROFLOXACIN HCL 500 MG PO TABS: 500.0000 mg | ORAL_TABLET | Freq: Two times a day (BID) | ORAL | 0 refills | Status: AC

## 2017-06-27 NOTE — Discharge Instructions (Signed)
Drink plenty of fluids. Return if symptoms are getting worse. Do not take loperamide (Imodium AD). It is ok to take Pepto Bismol or Kaopectate. Return if symptoms are getting worse.

## 2017-06-27 NOTE — ED Provider Notes (Signed)
[place] EMERGENCY DEPARTMENT Provider Note   CSN: [id number] Arrival date & time: 06/26/17  2030     History   Chief Complaint Chief Complaint  Patient presents with  . GI Bleeding    Bright Red Blood per rectum    HPI Michelle [name] is a 26 y.o. female.  The history is provided by the patient.  She had onset this morning of diarrhea.  She had multiple watery bowel movements which initially were blood streaks.  As the day is gone on, it has been more more bloody and last 3 bowel movements were just blood.  There is moderate cramping right around the time she has a bowel movement, but no pain other than that.  There has been nausea.  She denies fever, chills, sweats.  She denies any recent travel and denies any sick contacts.  She denies any suspect food exposure.  She went to urgent care earlier today and was told that the problem might be underlying constipation.  After she left urgent care, amount of bleeding increased significantly.  Past Medical History:  Diagnosis Date  . Anxiety   . Asthma   . Bipolar disorder ([place])    no current med.  . Depression    no current med.  . Family history of adverse reaction to anesthesia    states mother and sister are hard to wake up post-op  . Gestational diabetes   . History of seizure 06/20/2012   x 1 - during delivery of child(eclampsia)  . Hypertension   . Lipoma of lower back 08/2015  . Migraine   . PTSD (post-traumatic stress disorder)   . PTSD (post-traumatic stress disorder)   . TMJ (dislocation of temporomandibular joint)   . Vertigo     Patient Active Problem List   Diagnosis Date Noted  . Weight gain 12/13/2016  . Ear lesion 12/13/2016  . Anxiety state 10/27/2016  . Migraine 02/10/2016  . PTSD 03/02/2010  . Tobacco use disorder 02/07/2008  . PAP SMEAR, LGSIL, ABNORMAL 11/27/2006  . 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  ABDOMINAL HYSTERECTOMY    . ADENOIDECTOMY, TONSILLECTOMY AND MYRINGOTOMY WITH TUBE PLACEMENT  1990's  . CESAREAN SECTION  06/20/2012   Procedure: CESAREAN SECTION;  Surgeon: Emily Filbert, MD;  Location: [facility];  Service: Obstetrics;  Laterality: N/A;  . LAPAROSCOPIC APPENDECTOMY  07/25/2012   Procedure: APPENDECTOMY LAPAROSCOPIC;  Surgeon: Zenovia Jarred, MD;  Location: [place] [facility];  Service: General;  Laterality: N/A;  . LAPAROSCOPIC TUBAL LIGATION Bilateral 08/04/2014   Procedure: BILATERAL LAPAROSCOPIC TUBAL LIGATION;  Surgeon: Woodroe Mode, MD;  Location: [facility];  Service: Gynecology;  Laterality: Bilateral;  . LIPOMA EXCISION N/A 09/16/2015   Procedure: EXCISION LIPOMA LUMBAR REGION;  Surgeon: Donnie Mesa, MD;  Location: [facility];  Service: General;  Laterality: N/A;  . WISDOM TOOTH EXTRACTION      OB History    Gravida Para Term Preterm AB Living   3 3 2 1  0 3   SAB TAB Ectopic Multiple Live Births   0 0 0 0 1      Obstetric Comments   C-Section Indication: Non-reassuring fetal tracing, growth delay & marked proteinuria & Pre-Eclampsia Eclamptic Seizure during C-section delivery       Home Medications    Prior to Admission medications   Medication Sig Start Date End Date Taking? Authorizing Provider  albuterol (PROAIR HFA) 108 (90 Base) MCG/ACT inhaler Inhale 2 puffs into the lungs every 6 (six) hours as needed for wheezing or shortness of breath.    [provider]  ciprofloxacin (CIPRO) 500 MG tablet Take 1 tablet (500 mg total) by mouth 2 (two) times daily for 7 days. 06/26/17 07/03/17  Harrie Foreman, MD  eletriptan (RELPAX) 20 MG tablet TAKE 1 TABLET BY MOUTH AS NEEDED FOR MIGRAINE OR HEADACHE. MAY REPEAT IN 2 HOURS IF HEADACHE PERSISTS OR RECURS 03/19/17   Shelly Bombard, MD  ibuprofen (ADVIL,MOTRIN) 200 MG tablet Take 800 mg by mouth every 6 (six) hours as needed (pain).    [provider]  lactulose (CHRONULAC) 10 GM/15ML solution Take  15 mLs (10 g total) by mouth 3 (three) times daily. 06/26/17   Harrie Foreman, MD  metoprolol succinate (TOPROL XL) 25 MG 24 hr tablet Take 1 tablet (25 mg total) by mouth daily. 11/28/16   Evans Lance, MD  metroNIDAZOLE (FLAGYL) 500 MG tablet Take 1 tablet (500 mg total) by mouth 3 (three) times daily. 06/26/17   Harrie Foreman, MD  nortriptyline (PAMELOR) 25 MG capsule Take 3 capsules (75 mg total) by mouth at bedtime. 02/26/17   Cameron Sprang, MD  pantoprazole (PROTONIX) 40 MG tablet TAKE 2 TABLETS(80 MG) BY MOUTH DAILY 06/12/17   Mayo, Pete Pelt, MD  PROAIR HFA 108 [phone] Base) MCG/ACT inhaler INHALE 2 PUFFS INTO THE LUNGS EVERY 4 HOURS AS NEEDED FOR WHEEZING OR SHORTNESS OF BREATH 03/26/17   Mayo, Pete Pelt, MD  SUMAtriptan Succinate (ZEMBRACE SYMTOUCH) 3 MG/0.5ML SOAJ Inject 1 Dose into the skin once. Inject one dose as needed at onset of migraine. Do not use more than 3 times a week. 02/26/17 02/26/17  Cameron Sprang, MD  SUMATRIPTAN SUCCINATE IJ Inject 1 each into the muscle once as needed (migraine).    [provider]    Family History Family History  Problem Relation Age of Onset  . Breast cancer Paternal Grandmother   . Colon cancer Paternal Grandfather   . Colon cancer Maternal Grandfather   . Colon polyps Maternal Aunt   . Diabetes Mother   . Anesthesia problems Mother        hard to wake up post-op  . Diabetes Sister   . Anesthesia problems Sister        hard to wake up post-op  . Diabetes Father   . Heart disease Father     Social History Social History   Tobacco Use  . Smoking status: Current Every Day Smoker    Packs/day: 0.50    Years: 14.00    Pack years: 7.00    Types: Cigarettes  . Smokeless tobacco: Current User  Substance Use Topics  . Alcohol use: No    Alcohol/week: 0.0 oz  . Drug use: No     Allergies   Codeine   Review of Systems Review of Systems  All other systems reviewed and are negative.    Physical Exam Updated  Vital Signs BP 112/71 (BP Location: Right Arm)   Pulse (!) 118   Temp 98.4 F (36.9 C) (Oral)   Resp 18   Ht 5\' 2"  (1.575 m)   LMP 08/14/2016 (Exact Date)   SpO2 97%   BMI 30.54 kg/m   Physical Exam  Nursing note and vitals reviewed.  26 year old female, resting comfortably and in no acute distress. Vital signs are significant for tachycardia. Oxygen saturation  is 97%, which is normal. Head is normocephalic and atraumatic. PERRLA, EOMI. Oropharynx is clear. Neck is nontender and supple without adenopathy or JVD. Back is nontender and there is no CVA tenderness. Lungs are clear without rales, wheezes, or rhonchi. Chest is nontender. Heart is tachycardic without murmur. Abdomen is soft, flat, nontender without masses or hepatosplenomegaly and peristalsis is hypoactive. Extremities have no cyanosis or edema, full range of motion is present. Skin is warm and dry without rash. Neurologic: Mental status is normal, cranial nerves are intact, there are no motor or sensory deficits.  ED Treatments / Results  Labs (all labs ordered are listed, but only abnormal results are displayed) Labs Reviewed  COMPREHENSIVE METABOLIC PANEL - Abnormal; Notable for the following components:      Result Value   Potassium 3.4 (*)    Glucose, Bld 126 (*)    BUN 5 (*)    All other components within normal limits  CBC - Abnormal; Notable for the following components:   WBC 15.2 (*)    All other components within normal limits  I-STAT BETA HCG BLOOD, ED ([facility], [facility], [facility] ONLY)  POC OCCULT BLOOD, ED  TYPE AND SCREEN  ABO/RH   Procedures Procedures (including critical care time)  Medications Ordered in ED Medications  sodium chloride 0.9 % bolus 1,000 mL (not administered)  ondansetron (ZOFRAN) injection 4 mg (not administered)     Initial Impression / Assessment and Plan / ED Course  I have reviewed the triage vital signs and the nursing notes.  Pertinent lab results that were available during  my care of the patient were reviewed by me and considered in my medical decision making (see chart for details).  Bloody diarrhea consistent with bacterial dysentery.  No red flags to suspect more serious illness.  Benign physical exam.  She is resting comfortably but tachycardic at rest.  She will be given IV fluids.  Given degree of bleeding, will treat with antibiotics.  She is given a dose of ciprofloxacin in the ED.  Old records are reviewed confirming urgent care evaluation earlier today.  Orthostatic vital signs are unremarkable.  No abnormal change in heart rate or blood pressure.  She is discharged with prescription for ciprofloxacin.  Return precautions discussed.  Final Clinical Impressions(s) / ED Diagnoses   Final diagnoses:  Dysentery, bacillary    ED Discharge Orders        Ordered    ciprofloxacin (CIPRO) 500 MG tablet  2 times daily     85/63/14 9702       Delora Fuel, MD 63/78/58 0530

## 2017-07-15 ENCOUNTER — Other Ambulatory Visit: Payer: Self-pay | Admitting: Internal Medicine

## 2017-07-23 ENCOUNTER — Other Ambulatory Visit: Payer: Self-pay | Admitting: Internal Medicine

## 2017-08-07 ENCOUNTER — Telehealth: Payer: Self-pay | Admitting: Internal Medicine

## 2017-08-07 NOTE — Telephone Encounter (Signed)
Patient calling, would like to verify if heart condition is genetic.

## 2017-08-13 NOTE — Progress Notes (Addendum)
Cardiology Office Note Date:  08/17/2017  Patient ID:  Michelle [name], Michelle [name] 06-01-1991, MRN [id number] PCP:  Sela Hua, MD  Cardiologist:  Dr. Lovena Le    Chief Complaint:  Patient reports planned f/u  History of Present Illness: Michelle [name] is a 27 y.o. female with history of Anxiety/depression, Bipolar disorder is noted on problem list as well as PTSD, HTN, gestestional DM.  She comes in today to be seen for Dr. Lovena Le.  Her initial and only visit with him was in May 2018, after an ER visit with palpitations./dizziness, noted to have ST.  He noted her BP elevated and started Toprol.  Discussed importance of exercise, life style modifications.  Suggested if no improvement in rate may benefit from pursuing thyroid /further testing  In review of ER record noted she had started new medicine for "severe anxiety", a course of steroids and abx for possible infection at a peircing site  06/27/17 ER with bloody diarrhea, bacterial home with abx  She is feeling well overall. She will about 2x a months feel like her heart is racing unusually, otherwise no symptoms.  She is busy with her 3 kids.  She is more aware of her heart rate when she is anxious.  She worries significantly about her palpitations, her Mom recently found with stroke and AFib.  She exercises most days, at home with both toning type exercises as well as cardio.  She feels good exercises and reports good exertional capacity.  No CP, SOB, no dizziness, near syncope or syncope.   Past Medical History:  Diagnosis Date  . Anxiety   . Asthma   . Bipolar disorder ([place])    no current med.  . Depression    no current med.  . Family history of adverse reaction to anesthesia    states mother and sister are hard to wake up post-op  . Gestational diabetes   . History of seizure 06/20/2012   x 1 - during delivery of child(eclampsia)  . Hypertension   . Lipoma of lower back 08/2015  . Migraine   . PTSD (post-traumatic stress  disorder)   . PTSD (post-traumatic stress disorder)   . TMJ (dislocation of temporomandibular joint)   . Vertigo     Past Surgical History:  Procedure Laterality Date  . ABDOMINAL HYSTERECTOMY    . ADENOIDECTOMY, TONSILLECTOMY AND MYRINGOTOMY WITH TUBE PLACEMENT  1990's  . CESAREAN SECTION  06/20/2012   Procedure: CESAREAN SECTION;  Surgeon: Emily Filbert, MD;  Location: [facility];  Service: Obstetrics;  Laterality: N/A;  . LAPAROSCOPIC APPENDECTOMY  07/25/2012   Procedure: APPENDECTOMY LAPAROSCOPIC;  Surgeon: Zenovia Jarred, MD;  Location: [facility];  Service: General;  Laterality: N/A;  . LAPAROSCOPIC TUBAL LIGATION Bilateral 08/04/2014   Procedure: BILATERAL LAPAROSCOPIC TUBAL LIGATION;  Surgeon: Woodroe Mode, MD;  Location: [facility];  Service: Gynecology;  Laterality: Bilateral;  . LIPOMA EXCISION N/A 09/16/2015   Procedure: EXCISION LIPOMA LUMBAR REGION;  Surgeon: Donnie Mesa, MD;  Location: [facility];  Service: General;  Laterality: N/A;  . WISDOM TOOTH EXTRACTION      Current Outpatient Medications  Medication Sig Dispense Refill  . albuterol (PROAIR HFA) 108 (90 Base) MCG/ACT inhaler Inhale 2 puffs into the lungs every 6 (six) hours as needed for wheezing or shortness of breath.    . eletriptan (RELPAX) 20 MG tablet TAKE 1 TABLET BY MOUTH AS NEEDED FOR MIGRAINE OR HEADACHE. MAY REPEAT IN 2 HOURS IF HEADACHE  PERSISTS OR RECURS 10 tablet 0  . ibuprofen (ADVIL,MOTRIN) 200 MG tablet Take 800 mg by mouth every 6 (six) hours as needed (pain).    . metoprolol succinate (TOPROL-XL) 25 MG 24 hr tablet TAKE 1 TABLET(25 MG) BY MOUTH DAILY 30 tablet 4  . nortriptyline (PAMELOR) 25 MG capsule Take 3 capsules (75 mg total) by mouth at bedtime. 90 capsule 11  . pantoprazole (PROTONIX) 40 MG tablet TAKE 2 TABLETS(80 MG) BY MOUTH DAILY 120 tablet 0  . PROAIR HFA 108 (90 Base) MCG/ACT inhaler INHALE 2 PUFFS INTO THE LUNGS EVERY 4 HOURS AS NEEDED FOR WHEEZING OR SHORTNESS OF BREATH 8.5 g 0  .  SUMATRIPTAN SUCCINATE IJ Inject 1 each into the muscle once as needed (migraine).    . SUMAtriptan Succinate (ZEMBRACE SYMTOUCH) 3 MG/0.5ML SOAJ Inject 1 Dose into the skin once. Inject one dose as needed at onset of migraine. Do not use more than 3 times a week. 9 pen 5   No current facility-administered medications for this visit.     Allergies:   Patient has no active allergies.   Social History:  The patient  reports that she has been smoking cigarettes.  She has a 7.00 pack-year smoking history. She uses smokeless tobacco. She reports that she does not drink alcohol or use drugs.   Family History:  The patient's family history includes Anesthesia problems in her mother and sister; Breast cancer in her paternal grandmother; Colon cancer in her maternal grandfather and paternal grandfather; Colon polyps in her maternal aunt; Diabetes in her father, mother, and sister; Heart disease in her father.  ROS:  Please see the history of present illness.    All other systems are reviewed and otherwise negative.   PHYSICAL EXAM:  VS:  BP 112/88   Pulse (!) 119   Ht 5\' 2"  (1.575 m)   Wt 153 lb (69.4 kg)   LMP 08/14/2016 (Exact Date)   BMI 27.98 kg/m  BMI: Body mass index is 27.98 kg/m. Well nourished, well developed, in no acute distress  HEENT: normocephalic, atraumatic  Neck: no JVD, carotid bruits or masses Cardiac:  RRR; no significant murmurs, no rubs, or gallops Lungs:  CTA b/l, no wheezing, rhonchi or rales  Abd: soft, nontender MS: no deformity or atrophy Ext: no edema  Skin: warm and dry, no rash Neuro:  No gross deficits appreciated Psych: euthymic mood, full affect   EKG:  Done today and reviewed by myself Initially ST 119bom, towards end of tracing rate is slowing to 90's, is sinus.  01/24/16: TTE Study Conclusions - Left ventricle: The cavity size was normal. Systolic function was   vigorous. The estimated ejection fraction was in the range of 65%   to 70%. Wall motion  was normal; there were no regional wall   motion abnormalities. Due to tachycardia, there was fusion of   early and atrial contributions to ventricular filling.  Recent Labs: 11/30/2016: TSH 1.230 06/26/2017: ALT 34; BUN 5; Creatinine, Ser 0.88; Hemoglobin 14.8; Platelets 269; Potassium 3.4; Sodium 135  No results found for requested labs within last 8760 hours.   CrCl cannot be calculated (Patient's most recent lab result is older than the maximum 21 days allowed.).   Wt Readings from Last 3 Encounters:  08/17/17 153 lb (69.4 kg)  02/26/17 167 lb (75.8 kg)  12/13/16 176 lb (79.8 kg)     Other studies reviewed: Additional studies/records reviewed today include: summarized above  ASSESSMENT AND PLAN:  1. Palpitations,  noted to date to be ST     Anxiety provoking for her particularly with recent diagnosis of AFib by her Mom after a stroke     TSH was wnl at diagnosis  Discussed avoiding stimulants, continuing cardiovascular exercise/conditioning, adequate hydration.  Will have her wear an event monitor to evaluate further  2. HTN     She denies this as an established diagnosis     No change, continue metoprolol    Disposition: F/u with in 6 months, sooner if needed pending monitor findings.  Noted after the patient left, she is listed as an active smoker, will call to discuss/counsel  ADDEND: called patient, she reports that she quit smoking in Sept.  Congratulated and encouraged to keep off them.    Current medicines are reviewed at length with the patient today.  The patient did not have any concerns regarding medicines.  Venetia Night, PA-C 08/17/2017 8:59 AM     [facility] [address] [place] [address] [phone] (office)  [phone] (fax)

## 2017-08-15 ENCOUNTER — Telehealth: Payer: Self-pay

## 2017-08-15 NOTE — Telephone Encounter (Signed)
error 

## 2017-08-15 NOTE — Telephone Encounter (Signed)
Returned Pt call.  Pt calling asking if her heart condition was genetic. Per Dr. Lovena Le- "unknown". Per Pt, her child is possibly going to be put on stimulants, when Pt told doctor about her heart rate the doctor prescribing the stimulants for her child asked if her heart condition could be genetic.  Will discuss with Dr. Lovena Le.

## 2017-08-16 ENCOUNTER — Telehealth: Payer: Self-pay

## 2017-08-16 ENCOUNTER — Encounter: Payer: Self-pay | Admitting: Physician Assistant

## 2017-08-16 NOTE — Telephone Encounter (Signed)
Patient left message on nurse line that her daughter (also a patient here, Roderick Pee 08/08/06), has been diagnosed by UC with scabies. Patient wondering if treatment for herself and other two children can be called in or if OV is required. Danley Danker, RN [facility] RN)

## 2017-08-16 NOTE — Telephone Encounter (Signed)
Call placed to Pt.  Notified Pt that Dr. Lovena Le does not know if what she has is genetic, so testing for her child is not needed.  Advised Pt she could take her records so her son's doctor could review.  Pt asking for records.  Pt with appt tomorrow.  Will give to MR.

## 2017-08-17 ENCOUNTER — Encounter: Payer: Self-pay | Admitting: Physician Assistant

## 2017-08-17 ENCOUNTER — Ambulatory Visit (INDEPENDENT_AMBULATORY_CARE_PROVIDER_SITE_OTHER): Payer: Medicaid Other

## 2017-08-17 ENCOUNTER — Ambulatory Visit: Payer: Medicaid Other | Admitting: Physician Assistant

## 2017-08-17 VITALS — BP 112/88 | HR 119 | Ht 62.0 in | Wt 153.0 lb

## 2017-08-17 DIAGNOSIS — R000 Tachycardia, unspecified: Secondary | ICD-10-CM | POA: Diagnosis not present

## 2017-08-17 DIAGNOSIS — R002 Palpitations: Secondary | ICD-10-CM | POA: Diagnosis not present

## 2017-08-17 NOTE — Patient Instructions (Addendum)
Medication Instructions:   Your physician recommends that you continue on your current medications as directed. Please refer to the Current Medication list given to you today.   If you need a refill on your cardiac medications before your next appointment, please call your pharmacy.  Labwork: NONE ORDERED  TODAY   Testing/Procedures: Your physician has recommended that you wear an event monitor. Event monitors are medical devices that record the heart's electrical activity. Doctors most often [place] these monitors to diagnose arrhythmias. Arrhythmias are problems with the speed or rhythm of the heartbeat. The monitor is a small, portable device. You can wear one while you do your normal daily activities. This is usually used to diagnose what is causing palpitations/syncope (passing out).     Follow-Up:  Your physician wants you to follow-up in:  [place] will receive a reminder letter in the mail two months in advance. If you don't receive a letter, please call our office to schedule the follow-up appointment.      Any Other Special Instructions Will Be Listed Below (If Applicable).

## 2017-08-20 ENCOUNTER — Other Ambulatory Visit: Payer: Self-pay | Admitting: Internal Medicine

## 2017-08-20 MED ORDER — PERMETHRIN 1 % EX LOTN: 1.0000 "application " | TOPICAL_LOTION | Freq: Once | CUTANEOUS | 0 refills | Status: AC

## 2017-08-20 NOTE — Telephone Encounter (Signed)
Roderic Palau, pharmacist from [facility] left message on nurse line asking to change Permethrin prescription from LOTION to CREAM, which is preferred. Danley Danker, RN [facility] RN)

## 2017-08-20 NOTE — Telephone Encounter (Signed)
Prescription sent in for Permethrin 1% lotion for the rest of the family members. Thanks!

## 2017-08-20 NOTE — Telephone Encounter (Signed)
Mother aware. [name] Hartsell,CMA

## 2017-08-22 ENCOUNTER — Other Ambulatory Visit: Payer: Self-pay | Admitting: Internal Medicine

## 2017-08-22 MED ORDER — PERMETHRIN 5 % EX CREA: 1.0000 "application " | TOPICAL_CREAM | Freq: Once | CUTANEOUS | 0 refills | Status: AC

## 2017-08-22 NOTE — Telephone Encounter (Signed)
Prescription changed

## 2017-08-29 ENCOUNTER — Ambulatory Visit: Payer: Medicaid Other | Admitting: Neurology

## 2017-08-29 ENCOUNTER — Encounter: Payer: Self-pay | Admitting: Neurology

## 2017-08-29 ENCOUNTER — Other Ambulatory Visit: Payer: Self-pay

## 2017-08-29 VITALS — BP 112/70 | HR 91 | Ht 62.0 in | Wt 153.0 lb

## 2017-08-29 DIAGNOSIS — G43009 Migraine without aura, not intractable, without status migrainosus: Secondary | ICD-10-CM

## 2017-08-29 MED ORDER — NORTRIPTYLINE HCL 25 MG PO CAPS: 75.0000 mg | ORAL_CAPSULE | Freq: Every day | ORAL | 11 refills | Status: DC

## 2017-08-29 NOTE — Progress Notes (Signed)
NEUROLOGY FOLLOW UP OFFICE NOTE  Michelle [name] [id number]  DOB: 09/05/1990  HISTORY OF PRESENT ILLNESS: I had the pleasure of seeing Michelle [name] in follow-up in the neurology clinic on 29/13/2019.  The patient was last seen 6 months ago. She was having new daily persistent headaches. MRI brain without contrast normal. She had an initial good response to nortriptyline, then complained of migraines every few days after hysterectomy. Nortriptyline dose was increased to 75mg  qhs. She has not had any migraines in the past 6 months. She has occasional mild "regular" headaches that resolve with prn Ibuprofen/Tylenol. Zembrace was not approved by her insurance, she has not needed it and still has samples to take if needed. Oral sumatriptan was not helpful. She denies any diplopia, focal numbness/tingling/weakness, no falls. She has had dizziness upon standing and has seen Cardiology, currently with a 30-day holter monitor.   HPI 03/06/16: This is a 27yo RH woman with a history of bipolar disorder, PTSD, hypertension, who presented with new onset headaches since June 2016. 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 They were so bad that she was shaking on the floor and felt like she would pass out. She went to the ER on 01/06/16 and reports having daily headaches that wax and wane in intensity since then. She would wake up with a headache then towards the evening they become more severe 10/10 with throbbing pain and nausea. She occasionally sees black dots in her vision. No positional component. She stopped Depo-Provera last week. No photo/phonophobia, tinnitus, focal numbness/tingling/weakness, dizziness, diplopia, dysarthria, dysphagia, bowel/bladder dysfunction. She has had neck pains as well. She has chronic back pain.   She has been dealing with  significant stomach cramps and will be seeing a specialist regarding concern for endometriosis. She has been taking Tramadol and Percocet daily for the cramps, and has prn Fioricet for the headaches, taking 2-3 daily for the past 2 months. She gets 8-9 hours of sleep but still feels exhausted (for the past couple of years). She denies snoring, she has daytime drowsiness. She was given Reglan as well in the ER and takes this daily. There is a history of one seizure during delivery of her third child in 2013. No family history of migraines.   PAST MEDICAL HISTORY: Past Medical History:  Diagnosis Date  . Anxiety   . Asthma   . Bipolar disorder ([place])    no current med.  . Depression    no current med.  . Family history of adverse reaction to anesthesia    states mother and sister are hard to wake up post-op  . Gestational diabetes   . History of seizure 06/20/2012   x 1 - during delivery of child(eclampsia)  . Hypertension   . Lipoma of lower back 08/2015  . Migraine   . PTSD (post-traumatic stress disorder)   . PTSD (post-traumatic stress disorder)   . TMJ (dislocation of temporomandibular joint)   . Vertigo     MEDICATIONS: Current Outpatient Medications on File Prior to Visit  Medication Sig Dispense Refill  . albuterol (PROAIR HFA) 108 (90 Base) MCG/ACT inhaler Inhale 2 puffs into the lungs every 6 (six) hours as needed for wheezing or shortness of breath.    . eletriptan (RELPAX) 20 MG tablet TAKE 1 TABLET BY MOUTH AS NEEDED FOR MIGRAINE OR HEADACHE.  MAY REPEAT IN 2 HOURS IF HEADACHE PERSISTS OR RECURS 10 tablet 0  . ibuprofen (ADVIL,MOTRIN) 200 MG tablet Take 800 mg by mouth every 6 (six) hours as needed (pain).    . metoprolol succinate (TOPROL-XL) 25 MG 24 hr tablet TAKE 1 TABLET(25 MG) BY MOUTH DAILY 30 tablet 4  . nortriptyline (PAMELOR) 25 MG capsule Take 3 capsules (75 mg total) by mouth at bedtime. 90 capsule 11  . pantoprazole (PROTONIX) 40 MG tablet TAKE 2 TABLETS(80 MG)  BY MOUTH DAILY 120 tablet 0  . PROAIR HFA 108 (90 Base) MCG/ACT inhaler INHALE 2 PUFFS INTO THE LUNGS EVERY 4 HOURS AS NEEDED FOR WHEEZING OR SHORTNESS OF BREATH 8.5 g 0  . SUMATRIPTAN SUCCINATE IJ Inject 1 each into the muscle once as needed (migraine).    . SUMAtriptan Succinate (ZEMBRACE SYMTOUCH) 3 MG/0.5ML SOAJ Inject 1 Dose into the skin once. Inject one dose as needed at onset of migraine. Do not use more than 3 times a week. 9 pen 5   No current facility-administered medications on file prior to visit.     ALLERGIES: No Active Allergies  FAMILY HISTORY: Family History  Problem Relation Age of Onset  . Breast cancer Paternal Grandmother   . Colon cancer Paternal Grandfather   . Colon cancer Maternal Grandfather   . Colon polyps Maternal Aunt   . Diabetes Mother   . Anesthesia problems Mother        hard to wake up post-op  . Diabetes Sister   . Anesthesia problems Sister        hard to wake up post-op  . Diabetes Father   . Heart disease Father     SOCIAL HISTORY: Social History   Socioeconomic History  . Marital status: Single    Spouse name: Not on file  . Number of children: 2  . Years of education: Not on file  . Highest education level: Not on file  Social Needs  . Financial resource strain: Not on file  . Food insecurity - worry: Not on file  . Food insecurity - inability: Not on file  . Transportation needs - medical: Not on file  . Transportation needs - non-medical: Not on file  Occupational History  . Not on file  Tobacco Use  . Smoking status: Former Smoker    Packs/day: 0.00    Years: 14.00    Pack years: 0.00    Types: Cigarettes    Last attempt to quit: 03/17/2017    Years since quitting: 0.4  . Smokeless tobacco: Former Network engineer and Sexual Activity  . Alcohol use: No    Alcohol/week: 0.0 oz  . Drug use: No  . Sexual activity: Yes    Birth control/protection: Surgical, None  Other Topics Concern  . Not on file  Social History  Narrative   Lives with boyfriend and 3 children in a one story home.  Does not work.  Education: 9th grade.    REVIEW OF SYSTEMS: Constitutional: No fevers, chills, or sweats, no generalized fatigue, change in appetite Eyes: No visual changes, double vision, eye pain Ear, nose and throat: No hearing loss, ear pain, nasal congestion, sore throat Cardiovascular: No chest pain, palpitations Respiratory:  No shortness of breath at rest or with exertion, wheezes GastrointestinaI: No nausea, vomiting, diarrhea, abdominal pain, fecal incontinence Genitourinary:  No dysuria, urinary retention or frequency Musculoskeletal:  No neck pain, back pain Integumentary: No rash, pruritus, skin lesions Neurological: as above Psychiatric: + depression,  insomnia, anxiety Endocrine: No palpitations, fatigue, diaphoresis, mood swings, change in appetite, change in weight, increased thirst Hematologic/Lymphatic:  No anemia, purpura, petechiae. Allergic/Immunologic: no itchy/runny eyes, nasal congestion, recent allergic reactions, rashes  PHYSICAL EXAM: Vitals:   08/29/17 0837  BP: 112/70  Pulse: 91  SpO2: 97%   General: No acute distress Head:  Normocephalic/atraumatic Neck: supple, no paraspinal tenderness, full range of motion Heart:  Regular rate and rhythm Lungs:  Clear to auscultation bilaterally Back: No paraspinal tenderness Skin/Extremities: No rash, no edema Neurological Exam: alert and oriented to person, place, and time. No aphasia or dysarthria. Fund of knowledge is appropriate.  Recent and remote memory are intact.  Attention and concentration are normal.    Able to name objects and repeat phrases. Cranial nerves: Pupils equal, round, reactive to light.  Extraocular movements intact with no nystagmus. Visual fields full. Facial sensation intact. No facial asymmetry. Tongue, uvula, palate midline.  Motor: Bulk and tone normal, muscle strength 5/5 throughout with no pronator drift.  Sensation  to light touch intact.  No extinction to double simultaneous stimulation.  Deep tendon reflexes 2+ throughout, toes downgoing.  Finger to nose testing intact.  Gait narrow-based and steady, able to tandem walk adequately.  Romberg negative.  IMPRESSION: This is a 27 yo RH woman with a history of bipolar disorder, PTSD, hypertension, who presented for new onset daily headaches with migranous features. MRI brain normal. She has had a good response to nortriptyline 75mg  qhs, no migraines for the past 6 months. She knows to minimize prn pain medication to 2-3 times a week to avoid rebound headaches. She will follow-up in 6 months and knows to call for any changes.   Thank you for allowing me to participate in her care.  Please do not hesitate to call for any questions or concerns.  The duration of this appointment visit was 15 minutes of face-to-face time with the patient.  Greater than 50% of this time was spent in counseling, explanation of diagnosis, planning of further management, and coordination of care.   Ellouise Newer, M.D.   CC: Dr. Brett Albino

## 2017-08-29 NOTE — Patient Instructions (Signed)
Great seeing you! Continue nortriptyline 75mg  at bedtime. Minimize Tylenol/Ibuprofen use to 2-3 times a week, otherwise headaches may worsen. Follow-up in 6 months, call for any changes.

## 2017-09-04 ENCOUNTER — Telehealth: Payer: Self-pay | Admitting: *Deleted

## 2017-09-04 NOTE — Telephone Encounter (Signed)
Patient having reaction to [employer] cardiac event monitor electrodes.  She had a reaction to the [employer] Patch monitor. [employer] sent her a MCT-3 with hypoallergenic and Soft-e electrodes.  She had a reaction to the hypoallergenic electrodes, Soft-E electrodes will not stay on.  I will leave a package of 48m Red Dot and Invisitrace electrodes at the front desk to pick up today.  Please let us know if these electrodes are not working.

## 2017-09-05 ENCOUNTER — Other Ambulatory Visit: Payer: Self-pay | Admitting: Internal Medicine

## 2017-09-06 ENCOUNTER — Telehealth: Payer: Self-pay | Admitting: Internal Medicine

## 2017-09-06 NOTE — Telephone Encounter (Signed)
Patient had reaction to the 16M Red Dot cloth electrode and the invistrace electrode offered as alternatives to the hypoallergic electrode and Soft-E electrodes.  She states, she does not have any area of skin not affected by the electrodes to try a new one.  We have exhausted the electrode selection offered through [employer].  I will leave a package of [employer] Stable Base electrodes for sensitive skin at the front desk.  Patient instructed to contact [employer] and inform them she will have her monitor off for a couple of days to let her skin heal.  She can apply a protective barrier such as Caladryl Clear, Calamine Lotion or a product like Maalox, to the electrode site prior to applying electrode to see if this will help with her skin sensitivity.

## 2017-09-06 NOTE — Telephone Encounter (Signed)
New message     Patient calling to discuss reaction to hypoallergenic electrodes for holter monitor. Please call

## 2017-10-16 ENCOUNTER — Ambulatory Visit (INDEPENDENT_AMBULATORY_CARE_PROVIDER_SITE_OTHER): Payer: Medicaid Other

## 2017-10-16 ENCOUNTER — Ambulatory Visit (HOSPITAL_COMMUNITY)
Admission: EM | Admit: 2017-10-16 | Discharge: 2017-10-16 | Disposition: A | Discharge status: Home / Self Care | Payer: Medicaid Other | Attending: Family Medicine | Admitting: Family Medicine

## 2017-10-16 ENCOUNTER — Encounter (HOSPITAL_COMMUNITY): Payer: Self-pay | Admitting: Emergency Medicine

## 2017-10-16 DIAGNOSIS — S46911A Strain of unspecified muscle, fascia and tendon at shoulder and upper arm level, right arm, initial encounter: Secondary | ICD-10-CM

## 2017-10-16 MED ORDER — KETOROLAC TROMETHAMINE 60 MG/2ML IM SOLN
60.0000 mg | Freq: Once | INTRAMUSCULAR | Status: AC
  Administered 2017-10-16: 60 mg via INTRAMUSCULAR

## 2017-10-16 MED ORDER — NAPROXEN 500 MG PO TABS: 500.0000 mg | ORAL_TABLET | Freq: Two times a day (BID) | ORAL | 0 refills | Status: DC

## 2017-10-16 MED ORDER — KETOROLAC TROMETHAMINE 60 MG/2ML IM SOLN
INTRAMUSCULAR | Status: AC
  Filled 2017-10-16: qty 2

## 2017-10-16 NOTE — ED Triage Notes (Signed)
Pt sts right shoulder pain after using arm to reach to back of the car today

## 2017-10-16 NOTE — ED Provider Notes (Signed)
[facility]    CSN: [id number] Arrival date & time: 10/16/17  1549     History   Chief Complaint Chief Complaint  Patient presents with  . Shoulder Pain    HPI Michelle [name] is a 27 y.o. female.   Michelle [name] presents with complaints of right anterior shoulder pain after she reached back into her car today at noon. She heard a pop and had severe pain which lasted approximately 30 minutes. It then subsided, however now has pain with movement of the shoulder. States she broke her collar bone as a 27 year old but otherwise without previous shoulder in jury. Denies numbness or tingling of arm or head. Took ibuprofen 400mg  today which did not help. Rates pian 8/10 with movement. Hx of tachycardia, following with cardiology for this and takes metoprolol.     ROS per HPI.      Past Medical History:  Diagnosis Date  . Anxiety   . Asthma   . Bipolar disorder ([place])    no current med.  . Depression    no current med.  . Family history of adverse reaction to anesthesia    states mother and sister are hard to wake up post-op  . Gestational diabetes   . History of seizure 06/20/2012   x 1 - during delivery of child(eclampsia)  . Hypertension   . Lipoma of lower back 08/2015  . Migraine   . PTSD (post-traumatic stress disorder)   . PTSD (post-traumatic stress disorder)   . TMJ (dislocation of temporomandibular joint)   . Vertigo     Patient Active Problem List   Diagnosis Date Noted  . Weight gain 12/13/2016  . Ear lesion 12/13/2016  . Anxiety state 10/27/2016  . Migraine 02/10/2016  . PTSD 03/02/2010  . Tobacco use disorder 02/07/2008  . PAP SMEAR, LGSIL, ABNORMAL 11/27/2006  . 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 ABDOMINAL HYSTERECTOMY    . ADENOIDECTOMY, TONSILLECTOMY AND MYRINGOTOMY WITH TUBE PLACEMENT  1990's  . CESAREAN SECTION  06/20/2012   Procedure: CESAREAN SECTION;  Surgeon: Emily Filbert, MD;  Location: [facility];  Service: Obstetrics;  Laterality: N/A;  . LAPAROSCOPIC APPENDECTOMY  07/25/2012   Procedure: APPENDECTOMY LAPAROSCOPIC;  Surgeon: Zenovia Jarred, MD;  Location: [facility];  Service: General;  Laterality: N/A;  . LAPAROSCOPIC TUBAL LIGATION Bilateral 08/04/2014   Procedure: BILATERAL LAPAROSCOPIC TUBAL LIGATION;  Surgeon: Woodroe Mode, MD;  Location: [facility];  Service: Gynecology;  Laterality: Bilateral;  . LIPOMA EXCISION N/A 09/16/2015   Procedure: EXCISION LIPOMA LUMBAR REGION;  Surgeon: Donnie Mesa, MD;  Location: [facility];  Service: General;  Laterality: N/A;  . WISDOM TOOTH EXTRACTION      OB History    Gravida  3   Para  3   Term  2   Preterm  1   AB  0   Living  3     SAB  0   TAB  0   Ectopic  0   Multiple  0   Live Births  1        Obstetric Comments  C-Section Indication: Non-reassuring fetal tracing, growth delay & marked proteinuria & Pre-Eclampsia Eclamptic Seizure during C-section delivery         Home Medications    Prior to Admission medications   Medication Sig Start Date End Date Taking? Authorizing Provider  albuterol (PROAIR  HFA) 108 (90 Base) MCG/ACT inhaler Inhale 2 puffs into the lungs every 6 (six) hours as needed for wheezing or shortness of breath.    [provider]  eletriptan (RELPAX) 20 MG tablet TAKE 1 TABLET BY MOUTH AS NEEDED FOR MIGRAINE OR HEADACHE. MAY REPEAT IN 2 HOURS IF HEADACHE PERSISTS OR RECURS 03/19/17   Shelly Bombard, MD  ibuprofen (ADVIL,MOTRIN) 200 MG tablet Take 800 mg by mouth every 6 (six) hours as needed (pain).    [provider]  metoprolol succinate (TOPROL-XL) 25 MG 24 hr tablet TAKE 1 TABLET(25 MG) BY MOUTH DAILY 07/23/17   Evans Lance, MD  naproxen (NAPROSYN) 500 MG tablet Take 1 tablet (500 mg total) by mouth 2 (two) times daily. 10/16/17   Zigmund Gottron, NP  nortriptyline (PAMELOR) 25 MG capsule Take 3 capsules (75 mg total) by mouth at  bedtime. 08/29/17   Cameron Sprang, MD  pantoprazole (PROTONIX) 40 MG tablet TAKE 2 TABLETS(80 MG) BY MOUTH DAILY 09/05/17   Mayo, Pete Pelt, MD  PROAIR HFA 108 [phone] Base) MCG/ACT inhaler INHALE 2 PUFFS INTO THE LUNGS EVERY 4 HOURS AS NEEDED FOR WHEEZING OR SHORTNESS OF BREATH 07/18/17   Nicolette Bang, DO  SUMAtriptan Succinate (ZEMBRACE SYMTOUCH) 3 MG/0.5ML SOAJ Inject 1 Dose into the skin once. Inject one dose as needed at onset of migraine. Do not use more than 3 times a week. 02/26/17 02/26/17  Cameron Sprang, MD  SUMATRIPTAN SUCCINATE IJ Inject 1 each into the muscle once as needed (migraine).    [provider]    Family History Family History  Problem Relation Age of Onset  . Breast cancer Paternal Grandmother   . Colon cancer Paternal Grandfather   . Colon cancer Maternal Grandfather   . Colon polyps Maternal Aunt   . Diabetes Mother   . Anesthesia problems Mother        hard to wake up post-op  . Diabetes Sister   . Anesthesia problems Sister        hard to wake up post-op  . Diabetes Father   . Heart disease Father     Social History Social History   Tobacco Use  . Smoking status: Former Smoker    Packs/day: 0.00    Years: 14.00    Pack years: 0.00    Types: Cigarettes    Last attempt to quit: 03/17/2017    Years since quitting: 0.5  . Smokeless tobacco: Former Network engineer Use Topics  . Alcohol use: No    Alcohol/week: 0.0 oz  . Drug use: No     Allergies   Patient has no known allergies.   Review of Systems Review of Systems   Physical Exam Triage Vital Signs ED Triage Vitals [10/16/17 1632]  Enc Vitals Group     BP 119/82     Pulse Rate (!) 120     Resp 18     Temp 99.1 F (37.3 C)     Temp Source Oral     SpO2 96 %     Weight      Height      Head Circumference      Peak Flow      Pain Score      Pain Loc      Pain Edu?      Excl. in [place]?    No data found.  Updated Vital Signs BP 119/82 (BP Location: Left Arm)    Pulse Marland Kitchen)  120   Temp 99.1 F (37.3 C) (Oral)   Resp 18   LMP 08/14/2016 (Exact Date)   SpO2 96%   Visual Acuity Right Eye Distance:   Left Eye Distance:   Bilateral Distance:    Right Eye Near:   Left Eye Near:    Bilateral Near:     Physical Exam  Constitutional: She is oriented to person, place, and time. She appears well-developed and well-nourished. No distress.  Cardiovascular: Regular rhythm and normal heart sounds. Tachycardia present.  Pulmonary/Chest: Effort normal and breath sounds normal.  Musculoskeletal:       Right shoulder: She exhibits decreased range of motion, tenderness, bony tenderness and pain. She exhibits no swelling, no effusion, no crepitus, no deformity, no laceration, no spasm, normal pulse and normal strength.       Arms: Significant pain with compression of ac joint; mild pain with raising of arm; minimal point tenderness; indicates pain at anterior shoulder; strong radial pulses; sensation intact to bilateral upper extremities and strength equal with push/pulls  Neurological: She is alert and oriented to person, place, and time.  Skin: Skin is warm and dry.     UC Treatments / Results  Labs (all labs ordered are listed, but only abnormal results are displayed) Labs Reviewed - No data to display  EKG None Radiology Dg Ac Joints  Result Date: 10/16/2017 CLINICAL DATA:  Right AC joint pain and limited range of motion following reaching injury today. EXAM: ACROMIOCLAVICULAR JOINTS COMPARISON:  Chest radiographs 11/20/2016.  Chest CT 01/23/2016. FINDINGS: AP views of both clavicles and acromioclavicular joints without and with weights. No evidence of acute fracture or dislocation. The acromioclavicular joints appear normal. There is no AC joint widening or motion on the weighted views. The visualized shoulder joints appear normal. IMPRESSION: Negative AC joint radiographs. Electronically Signed   By: Richardean Sale M.D.   On: 10/16/2017 17:24     Procedures Procedures (including critical care time)  Medications Ordered in UC Medications  ketorolac (TORADOL) injection 60 mg (has no administration in time range)     Initial Impression / Assessment and Plan / UC Course  I have reviewed the triage vital signs and the nursing notes.  Pertinent labs & imaging results that were available during my care of the patient were reviewed by me and considered in my medical decision making (see chart for details).     Without traumatic injury but pain after incident today with popping sound heard. consistent with strain. Negative ac joint imaging. Ice, rom exercises, naproxen twice a day. Follow up with pcp and/or orthopedics as needed. Patient verbalized understanding and agreeable to plan.    Final Clinical Impressions(s) / UC Diagnoses   Final diagnoses:  Strain of right shoulder, initial encounter    ED Discharge Orders        Ordered    naproxen (NAPROSYN) 500 MG tablet  2 times daily     10/16/17 1725       Controlled Substance Prescriptions [place] Controlled Substance Registry consulted? Not Applicable   Zigmund Gottron, NP 10/16/17 1731

## 2017-10-16 NOTE — Discharge Instructions (Signed)
Ice application Naproxen twice a day, take with food, start tomorrow.  Range of motion exercises regularly. If no improvement or worsening of pain in the next 2-3 weeks please follow up with your primary care provider and/or orthopedics for further evaluation.

## 2017-10-18 ENCOUNTER — Other Ambulatory Visit: Payer: Self-pay | Admitting: Internal Medicine

## 2017-10-26 ENCOUNTER — Other Ambulatory Visit: Payer: Self-pay

## 2017-10-26 ENCOUNTER — Ambulatory Visit (INDEPENDENT_AMBULATORY_CARE_PROVIDER_SITE_OTHER): Payer: Medicaid Other | Admitting: Internal Medicine

## 2017-10-26 ENCOUNTER — Encounter: Payer: Self-pay | Admitting: Internal Medicine

## 2017-10-26 DIAGNOSIS — R42 Dizziness and giddiness: Secondary | ICD-10-CM | POA: Diagnosis present

## 2017-10-26 NOTE — Assessment & Plan Note (Signed)
Likely due to Metoprolol succinate (which she takes for palpitations), as her symptoms began after starting this medication. Orthostatic vitals were negative in clinic today and patient appears well-hydrated on exam. No bleeding (has had a hysterectomy) and recent normal Hgb, so anemia is unlikely. TSH also recently normal, so thyroid dysfunction is unlikely. She had an ECHO in 2017 that was normal and no murmurs on exam to suggest valvular dysfunction. She is taking Nortriptyline (prescribed by her neurologist), which may be contributing to her symptoms; however, no recent changes in dose. ?Wonder if anxiety may be playing a role. - Will decrease Metoprolol succinate dose from 25mg  daily to 12.5mg  daily to see if this helps her symptoms - Encouraged good fluid intake - Follow-up with Cardiology to see if they have any further recommendations - May need to consider repeating thyroid tests and ECHO, but will hold off on doing this for now

## 2017-10-26 NOTE — Progress Notes (Signed)
   [facility] Phone: [phone]  Subjective:  Michelle [name] is a 27 year old female presenting to clinic with lightheadedness upon standing for the last few months. These symptoms began after starting Metoprolol succinate 25mg  daily for tachycardia. This medication was started by Cardiology. She also recently had cardiac event monitoring, which was normal. No hematochezia, vaginal bleeding, or bleeding from any other sites. No syncopal events or falls. No chest pain or shortness of breath. She states she drinks 82oz of fluid per day and tries to stay well-hydrated. Eating regular meals.   ROS: See HPI for pertinent positives and negatives  Past Medical History- migraines, bipolar disorder, PTSD  Family history reviewed for today's visit. No changes.  Social history- patient is a former smoker, quit in 2018  Objective: BP 102/70   Pulse 96   Temp 97.9 F (36.6 C) (Oral)   Wt 146 lb (66.2 kg)   LMP 08/14/2016 (Exact Date)   SpO2 99%   BMI 26.70 kg/m  Gen: NAD, alert, cooperative with exam HEENT: NCAT, EOMI, MMM Neck: FROM, supple CV: Mildly tachycardic, regular rhythm, no murmur Resp: CTABL, no wheezes, normal work of breathing Msk: No edema, warm, normal tone, moves UE/LE spontaneously Neuro: Alert and oriented, no gross deficits  Assessment/Plan: Lightheadedness: Likely due to Metoprolol succinate (which she takes for palpitations), as her symptoms began after starting this medication. Orthostatic vitals were negative in clinic today and patient appears well-hydrated on exam. No bleeding (has had a hysterectomy) and recent normal Hgb, so anemia is unlikely. TSH also recently normal, so thyroid dysfunction is unlikely. She had an ECHO in 2017 that was normal and no murmurs on exam to suggest valvular dysfunction. She is taking Nortriptyline (prescribed by her neurologist), which may be contributing to her symptoms; however, no recent changes in dose. ?Wonder if  anxiety may be playing a role. - Will decrease Metoprolol succinate dose from 25mg  daily to 12.5mg  daily to see if this helps her symptoms - Encouraged good fluid intake - Follow-up with Cardiology to see if they have any further recommendations - May need to consider repeating thyroid tests and ECHO, but will hold off on doing this for now   Hyman Bible, MD PGY-3

## 2017-10-26 NOTE — Patient Instructions (Signed)
It was so nice to see you today.  I would recommend cutting the Metoprolol in half for right now to see if this helps your dizziness. Please make sure you are drinking plenty of fluids. I think it would be a good idea to follow-up with the heart doctor again to see if there's any other testing they think you should do.  -Dr. Brett Albino

## 2017-11-11 ENCOUNTER — Encounter (HOSPITAL_COMMUNITY): Payer: Self-pay | Admitting: *Deleted

## 2017-11-11 ENCOUNTER — Ambulatory Visit (HOSPITAL_COMMUNITY)
Admission: EM | Admit: 2017-11-11 | Discharge: 2017-11-11 | Disposition: A | Discharge status: Home / Self Care | Payer: Medicaid Other | Attending: Urgent Care | Admitting: Urgent Care

## 2017-11-11 ENCOUNTER — Other Ambulatory Visit: Payer: Self-pay

## 2017-11-11 DIAGNOSIS — Z9109 Other allergy status, other than to drugs and biological substances: Secondary | ICD-10-CM

## 2017-11-11 DIAGNOSIS — L500 Allergic urticaria: Secondary | ICD-10-CM | POA: Diagnosis not present

## 2017-11-11 DIAGNOSIS — T07XXXA Unspecified multiple injuries, initial encounter: Secondary | ICD-10-CM

## 2017-11-11 DIAGNOSIS — T7840XA Allergy, unspecified, initial encounter: Secondary | ICD-10-CM | POA: Diagnosis not present

## 2017-11-11 DIAGNOSIS — F424 Excoriation (skin-picking) disorder: Secondary | ICD-10-CM | POA: Diagnosis not present

## 2017-11-11 HISTORY — DX: Tachycardia, unspecified: R00.0

## 2017-11-11 MED ORDER — METHYLPREDNISOLONE ACETATE 80 MG/ML IJ SUSP
80.0000 mg | Freq: Once | INTRAMUSCULAR | Status: AC
  Administered 2017-11-11: 80 mg via INTRAMUSCULAR

## 2017-11-11 MED ORDER — HYDROXYZINE HCL 25 MG PO TABS: 25.0000 mg | ORAL_TABLET | Freq: Every evening | ORAL | 0 refills | Status: DC | PRN

## 2017-11-11 MED ORDER — METHYLPREDNISOLONE ACETATE 80 MG/ML IJ SUSP
INTRAMUSCULAR | Status: AC
  Filled 2017-11-11: qty 1

## 2017-11-11 NOTE — ED Provider Notes (Signed)
MRN: [id number] DOB: 1991-07-11  Subjective:   Michelle [name] is a 27 y.o. female presenting for 1 day history of persistent hives over both of her legs, severe itching.  Patient has been trying Benadryl with minimal relief, has caused her to feel agitated as well.  She states that she has not spent a lot of time outdoors, has not seen any insect or tick bites.  Denies fever, headaches, redness of her eyes, sore throat, cough, chest tightness, shortness of breath, wheezing, nausea, vomiting, belly pain.  Patient is very concerned because she is previously had an anaphylactic reaction and required hospitalization and wants to avoid that happening again.  She has also use cortisone cream and Lubriderm without any relief of her rash.  She does have an appointment with her PCP tomorrow and plans on keeping that appointment but wanted to be evaluated today.  She denies starting new medications or changing hygiene products.  No current facility-administered medications for this encounter.   Current Outpatient Medications:  .  albuterol (PROVENTIL HFA;VENTOLIN HFA) 108 (90 Base) MCG/ACT inhaler, INHALE 2 PUFFS INTO THE LUNGS EVERY 4 HOURS AS NEEDED FOR WHEEZING OR SHORTNESS OF BREATH, Disp: 8.5 g, Rfl: 0 .  metoprolol succinate (TOPROL-XL) 25 MG 24 hr tablet, TAKE 1 TABLET(25 MG) BY MOUTH DAILY, Disp: 30 tablet, Rfl: 4 .  nortriptyline (PAMELOR) 25 MG capsule, Take 3 capsules (75 mg total) by mouth at bedtime., Disp: 90 capsule, Rfl: 11 .  pantoprazole (PROTONIX) 40 MG tablet, TAKE 2 TABLETS(80 MG) BY MOUTH DAILY, Disp: 120 tablet, Rfl: 0 .  SUMATRIPTAN SUCCINATE IJ, Inject 1 each into the muscle once as needed (migraine)., Disp: , Rfl:  .  albuterol (PROAIR HFA) 108 (90 Base) MCG/ACT inhaler, Inhale 2 puffs into the lungs every 6 (six) hours as needed for wheezing or shortness of breath., Disp: , Rfl:  .  eletriptan (RELPAX) 20 MG tablet, TAKE 1 TABLET BY MOUTH AS NEEDED FOR MIGRAINE OR HEADACHE. MAY REPEAT  IN 2 HOURS IF HEADACHE PERSISTS OR RECURS, Disp: 10 tablet, Rfl: 0 .  ibuprofen (ADVIL,MOTRIN) 200 MG tablet, Take 800 mg by mouth every 6 (six) hours as needed (pain)., Disp: , Rfl:  .  naproxen (NAPROSYN) 500 MG tablet, Take 1 tablet (500 mg total) by mouth 2 (two) times daily., Disp: 30 tablet, Rfl: 0 .  SUMAtriptan Succinate (ZEMBRACE SYMTOUCH) 3 MG/0.5ML SOAJ, Inject 1 Dose into the skin once. Inject one dose as needed at onset of migraine. Do not use more than 3 times a week., Disp: 9 pen, Rfl: 5   No Known Allergies  Past Medical History:  Diagnosis Date  . Anxiety   . Asthma   . Bipolar disorder ([place])    no current med.  . Depression    no current med.  . Family history of adverse reaction to anesthesia    states mother and sister are hard to wake up post-op  . Gestational diabetes   . History of seizure 06/20/2012   x 1 - during delivery of child(eclampsia)  . Hypertension   . Lipoma of lower back 08/2015  . Migraine   . PTSD (post-traumatic stress disorder)   . PTSD (post-traumatic stress disorder)   . Tachycardia   . TMJ (dislocation of temporomandibular joint)   . Vertigo      Past Surgical History:  Procedure Laterality Date  . ABDOMINAL HYSTERECTOMY    . ADENOIDECTOMY, TONSILLECTOMY AND MYRINGOTOMY WITH TUBE PLACEMENT  1990's  . CESAREAN SECTION  06/20/2012   Procedure: CESAREAN SECTION;  Surgeon: Emily Filbert, MD;  Location: [facility];  Service: Obstetrics;  Laterality: N/A;  . LAPAROSCOPIC APPENDECTOMY  07/25/2012   Procedure: APPENDECTOMY LAPAROSCOPIC;  Surgeon: Zenovia Jarred, MD;  Location: [facility];  Service: General;  Laterality: N/A;  . LAPAROSCOPIC TUBAL LIGATION Bilateral 08/04/2014   Procedure: BILATERAL LAPAROSCOPIC TUBAL LIGATION;  Surgeon: Woodroe Mode, MD;  Location: [facility];  Service: Gynecology;  Laterality: Bilateral;  . LIPOMA EXCISION N/A 09/16/2015   Procedure: EXCISION LIPOMA LUMBAR REGION;  Surgeon: Donnie Mesa, MD;  Location: [facility];  Service: General;  Laterality: N/A;  . WISDOM TOOTH EXTRACTION      Objective:   Vitals: BP 118/72   Pulse (!) 129 Comment: states HR is normal for her - on a beta blocker  Temp 98.3 F (36.8 C)   Resp 16   LMP 08/14/2016 (Exact Date)   SpO2 100%   Physical Exam  Constitutional: She is oriented to person, place, and time. She appears well-developed and well-nourished.  HENT:  Mouth/Throat: Oropharynx is clear and moist.  Eyes: Right eye exhibits no discharge. Left eye exhibits no discharge. No scleral icterus.  Cardiovascular: Normal rate, regular rhythm and intact distal pulses. Exam reveals no gallop and no friction rub.  No murmur heard. Pulmonary/Chest: No stridor. No respiratory distress. She has no wheezes. She has no rales.  Multiple stuck over last 2 large patches over both lower extremities.  There is also a multitude of excoriations from her itching and scratching.  Neurological: She is alert and oriented to person, place, and time.  Skin: Skin is warm and dry. Rash noted.  Psychiatric: She has a normal mood and affect.    Assessment and Plan :   Allergic urticaria  Multiple excoriations  Environmental allergies  We will use Depo-Medrol in clinic today, Vistaril for her itching.  Patient has an office visit with her PCP tomorrow, recommended that she discuss referral to an allergist.   Jaynee Eagles, PA-C 11/11/17 1630

## 2017-11-11 NOTE — ED Triage Notes (Signed)
C/O urticaria to bilat thighs since yesterday, now spreading down legs.

## 2017-11-11 NOTE — Discharge Instructions (Signed)
Make sure you avoid using new hygiene products.  Also do not change any soaps or detergents that you are using and at this point you should just be using stuff for gentle skin.  Touch base with your PCP tomorrow at your visit to make sure that you consider referral to allergy testing.  In the meantime you can certainly use hydroxyzine for your itching.  You can use 1 to 2 capsules at bedtime.

## 2017-11-16 ENCOUNTER — Ambulatory Visit: Payer: Medicaid Other | Admitting: Internal Medicine

## 2017-11-16 ENCOUNTER — Other Ambulatory Visit: Payer: Self-pay

## 2017-11-16 ENCOUNTER — Encounter: Payer: Self-pay | Admitting: Internal Medicine

## 2017-11-16 ENCOUNTER — Telehealth: Payer: Self-pay | Admitting: Internal Medicine

## 2017-11-16 VITALS — BP 104/64 | HR 107 | Temp 98.6°F | Ht 62.0 in | Wt 148.0 lb

## 2017-11-16 DIAGNOSIS — R238 Other skin changes: Secondary | ICD-10-CM | POA: Insufficient documentation

## 2017-11-16 DIAGNOSIS — R21 Rash and other nonspecific skin eruption: Secondary | ICD-10-CM | POA: Diagnosis not present

## 2017-11-16 DIAGNOSIS — R233 Spontaneous ecchymoses: Secondary | ICD-10-CM

## 2017-11-16 HISTORY — DX: Rash and other nonspecific skin eruption: R21

## 2017-11-16 HISTORY — DX: Spontaneous ecchymoses: R23.3

## 2017-11-16 LAB — CBC WITH DIFFERENTIAL/PLATELET
Basophils Absolute: 0 10*3/uL (ref 0.0–0.2)
Basos: 0 %
EOS (ABSOLUTE): 0 10*3/uL (ref 0.0–0.4)
Eos: 1 %
Hematocrit: 39.7 % (ref 34.0–46.6)
Hemoglobin: 13.7 g/dL (ref 11.1–15.9)
Lymphocytes Absolute: 3.3 10*3/uL — ABNORMAL HIGH (ref 0.7–3.1)
Lymphs: 39 %
MCH: 30.5 pg (ref 26.6–33.0)
MCHC: 34.5 g/dL (ref 31.5–35.7)
MCV: 88 fL (ref 79–97)
Monocytes Absolute: 0.6 10*3/uL (ref 0.1–0.9)
Monocytes: 7 %
Neutrophils Absolute: 4.5 10*3/uL (ref 1.4–7.0)
Neutrophils: 53 %
Platelets: 226 10*3/uL (ref 150–379)
RBC: 4.49 x10E6/uL (ref 3.77–5.28)
RDW: 12.9 % (ref 12.3–15.4)
WBC: 8.4 10*3/uL (ref 3.4–10.8)

## 2017-11-16 LAB — APTT: aPTT: 26 s (ref 24–33)

## 2017-11-16 LAB — PROTIME-INR
INR: 0.9 (ref 0.8–1.2)
Prothrombin Time: 9.5 s (ref 9.1–12.0)

## 2017-11-16 NOTE — Patient Instructions (Signed)
It was so nice to see you today!  I'm so glad your itching is better.   For your bruising- we need to make sure that you don't have a condition called Immune Thrombocytopenia (ITP), which is an autoimmune disease where your immune cells destroy your platelets.  I will call you with these results either this afternoon or Monday. If the results are abnormal, you will hear from our on-call doctor this evening.  - Dr. Brett Albino

## 2017-11-16 NOTE — Progress Notes (Signed)
   [facility] Phone: [phone]  Subjective:  Michelle [name] is a 27 year old female presenting to clinic for follow-up of hives and to discuss easy bruising.  Rash: Seen at urgent care on 11/11/17 with hives on her bilateral thighs and lower legs. She had itching starting on 11/09/17 and then had hives starting 4/27. The hives started on her thighs and then spread to her lower legs, so she went to urgent care. She was given depomedrol IM at urgent care and was also given Hydroxyzine. The rash has resolved. She is still having some residual itching, but the Hydroxyzine is helping with this.   Easy Bruising: Noticed new bruising on her thighs starting 5 days ago. She denies any trauma to the thighs. States she just wakes up each morning and has new bruises. The bruising started before she received Depomedrol at the urgent care. Not taking any aspirin or blood thinners. Bruising is just on her thighs. No vaginal bleeding, hematochezia, no epistaxis. This has never happened to her before. She notes associated sore throat and fatigue over the last few days.  ROS: See HPI for pertinent positives and negatives  Past Medical History- migraines, anxiety, bipolar disorder, PTSD  Family history reviewed for today's visit. No changes.  Social history- patient is a former smoker  Objective: BP 104/64   Pulse (!) 107   Temp 98.6 F (37 C) (Oral)   Ht 5\' 2"  (1.575 m)   Wt 148 lb (67.1 kg)   LMP 08/14/2016 (Exact Date)   SpO2 98%   BMI 27.07 kg/m  Gen: NAD, alert, cooperative with exam HEENT: NCAT, EOMI, MMM Skin: Multiple bruises in various stages of healing located throughout the front and backs of thighs. Some small areas of petechiae also present.  Assessment/Plan: Rash: Had allergic urticaria that resolved with steroids and Hydroxyzine. - Follow-up as needed  Easy Bruising: For the last 5 days. Has bruises of various stages on her thighs as well as petechiae. No  bleeding. Not on any blood thinners, anti-platelet meds, or any medications that are associated with thrombocytopenia. Concern for ITP, given patient's recent URI. Patient denies any physical abuse or trauma. - Check CBC, PT, and PTT - Will call patient to discuss results.   Hyman Bible, MD PGY-3

## 2017-11-16 NOTE — Assessment & Plan Note (Signed)
Had allergic urticaria that resolved with steroids and Hydroxyzine. - Follow-up as needed

## 2017-11-16 NOTE — Assessment & Plan Note (Signed)
For the last 5 days. Has bruises of various stages on her thighs as well as petechiae. No bleeding. Not on any blood thinners, anti-platelet meds, or any medications that are associated with thrombocytopenia. Concern for ITP, given patient's recent URI. Patient denies any physical abuse or trauma. - Check CBC, PT, and PTT - Will call patient to discuss results.

## 2017-11-16 NOTE — Telephone Encounter (Signed)
Called patient and let her know her lab results. CBC, PT, PTT all normal. All questions answered.  Hyman Bible, MD PGY-3

## 2017-11-18 ENCOUNTER — Other Ambulatory Visit: Payer: Self-pay | Admitting: Internal Medicine

## 2017-11-21 ENCOUNTER — Other Ambulatory Visit: Payer: Self-pay | Admitting: Internal Medicine

## 2017-11-21 ENCOUNTER — Other Ambulatory Visit (INDEPENDENT_AMBULATORY_CARE_PROVIDER_SITE_OTHER): Payer: Medicaid Other

## 2017-11-21 ENCOUNTER — Other Ambulatory Visit (INDEPENDENT_AMBULATORY_CARE_PROVIDER_SITE_OTHER): Payer: Medicaid Other | Admitting: Internal Medicine

## 2017-11-21 DIAGNOSIS — E663 Overweight: Secondary | ICD-10-CM

## 2017-11-21 LAB — POCT GLYCOSYLATED HEMOGLOBIN (HGB A1C): Hemoglobin A1C: 5.3

## 2017-12-06 ENCOUNTER — Encounter: Payer: Self-pay | Admitting: Internal Medicine

## 2017-12-07 ENCOUNTER — Encounter (HOSPITAL_COMMUNITY): Payer: Self-pay | Admitting: Emergency Medicine

## 2017-12-07 ENCOUNTER — Ambulatory Visit (HOSPITAL_COMMUNITY)
Admission: EM | Admit: 2017-12-07 | Discharge: 2017-12-07 | Disposition: A | Discharge status: Home / Self Care | Payer: Medicaid Other | Attending: Family Medicine | Admitting: Family Medicine

## 2017-12-07 DIAGNOSIS — L509 Urticaria, unspecified: Secondary | ICD-10-CM

## 2017-12-07 MED ORDER — METHYLPREDNISOLONE ACETATE 80 MG/ML IJ SUSP
80.0000 mg | Freq: Once | INTRAMUSCULAR | Status: AC
  Administered 2017-12-07: 80 mg via INTRAMUSCULAR

## 2017-12-07 MED ORDER — METHYLPREDNISOLONE ACETATE 80 MG/ML IJ SUSP: 80.0000 mg | Freq: Once | INTRAMUSCULAR | 0 refills | Status: AC

## 2017-12-07 MED ORDER — HYDROXYZINE HCL 25 MG PO TABS: 25.0000 mg | ORAL_TABLET | Freq: Every evening | ORAL | 0 refills | Status: DC | PRN

## 2017-12-07 MED ORDER — METHYLPREDNISOLONE ACETATE 80 MG/ML IJ SUSP
INTRAMUSCULAR | Status: AC
  Filled 2017-12-07: qty 1

## 2017-12-07 NOTE — Discharge Instructions (Signed)
Take the hydroxyzine for itching Follow up with your PCP

## 2017-12-07 NOTE — ED Provider Notes (Signed)
[facility]    CSN: [id number] Arrival date & time: 12/07/17  1548     History   Chief Complaint Chief Complaint  Patient presents with  . Rash    HPI Michelle [name] is a 27 y.o. female.   HPI  Patient was seen here for urticaria 2 weeks ago.  She got a shot of Depo-Medrol.  The rash got better in 24 hours.  The rash came back today.  She states is present on the back of both of her calves.  It itches terribly.  She called her PCP.  Her PCP told her to come back in for another steroid shot.  Atarax was used for the itching.  This works well for her.  She would like a refill.  She states her doctor is sending her for an allergy work-up.  This is the third time that she had hives.  I discussed with her that this flare of hives may be actually the same allergic reaction that she had 2 weeks ago.  The prednisone may have suppressed the reaction, but was not long enough to clear it completely.  She can discuss this with her PCP.  She is not allergic to anything that she knows of.  No difficulty breathing.  No wheezing.  No anaphylaxis.  Past Medical History:  Diagnosis Date  . Anxiety   . Asthma   . Bipolar disorder ([place])    no current med.  . Depression    no current med.  . Family history of adverse reaction to anesthesia    states mother and sister are hard to wake up post-op  . Gestational diabetes   . History of seizure 06/20/2012   x 1 - during delivery of child(eclampsia)  . Hypertension   . Lipoma of lower back 08/2015  . Migraine   . PTSD (post-traumatic stress disorder)   . PTSD (post-traumatic stress disorder)   . Tachycardia   . TMJ (dislocation of temporomandibular joint)   . Vertigo     Patient Active Problem List   Diagnosis Date Noted  . Rash and nonspecific skin eruption 11/16/2017  . Easy bruising 11/16/2017  . Anxiety state 10/27/2016  . Migraine 02/10/2016  . PTSD 03/02/2010  . 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 ABDOMINAL HYSTERECTOMY    . ADENOIDECTOMY, TONSILLECTOMY AND MYRINGOTOMY WITH TUBE PLACEMENT  1990's  . CESAREAN SECTION  06/20/2012   Procedure: CESAREAN SECTION;  Surgeon: Emily Filbert, MD;  Location: [facility];  Service: Obstetrics;  Laterality: N/A;  . LAPAROSCOPIC APPENDECTOMY  07/25/2012   Procedure: APPENDECTOMY LAPAROSCOPIC;  Surgeon: Zenovia Jarred, MD;  Location: [facility];  Service: General;  Laterality: N/A;  . LAPAROSCOPIC TUBAL LIGATION Bilateral 08/04/2014   Procedure: BILATERAL LAPAROSCOPIC TUBAL LIGATION;  Surgeon: Woodroe Mode, MD;  Location: [facility];  Service: Gynecology;  Laterality: Bilateral;  . LIPOMA EXCISION N/A 09/16/2015   Procedure: EXCISION LIPOMA LUMBAR REGION;  Surgeon: Donnie Mesa, MD;  Location: [facility];  Service: General;  Laterality: N/A;  . WISDOM TOOTH EXTRACTION      OB History    Gravida  3   Para  3   Term  2   Preterm  1   AB  0   Living  3     SAB  0   TAB  0   Ectopic  0   Multiple  0   Live Births  1        Obstetric Comments  C-Section Indication: Non-reassuring fetal tracing, growth delay & marked proteinuria & Pre-Eclampsia Eclamptic Seizure during C-section delivery         Home Medications    Prior to Admission medications   Medication Sig Start Date End Date Taking? Authorizing Provider  albuterol (PROAIR HFA) 108 (90 Base) MCG/ACT inhaler Inhale 2 puffs into the lungs every 6 (six) hours as needed for wheezing or shortness of breath.    [provider]  hydrOXYzine (ATARAX/VISTARIL) 25 MG tablet Take 1-2 tablets (25-50 mg total) by mouth at bedtime as needed for itching. 12/07/17   Raylene Everts, MD  ibuprofen (ADVIL,MOTRIN) 200 MG tablet Take 800 mg by mouth every 6 (six) hours as needed (pain).    [provider]  methylPREDNISolone acetate (DEPO-MEDROL) 80 MG/ML injection Inject 1 mL (80 mg total) into the muscle once for 1 dose.  12/07/17 12/07/17  Raylene Everts, MD  metoprolol succinate (TOPROL-XL) 25 MG 24 hr tablet TAKE 1 TABLET(25 MG) BY MOUTH DAILY 07/23/17   Evans Lance, MD  nortriptyline (PAMELOR) 25 MG capsule Take 3 capsules (75 mg total) by mouth at bedtime. 08/29/17   Cameron Sprang, MD  pantoprazole (PROTONIX) 40 MG tablet TAKE 2 TABLETS(80 MG) BY MOUTH DAILY 11/19/17   Mayo, Pete Pelt, MD  SUMAtriptan Succinate [facility]) 3 MG/0.5ML SOAJ Inject 1 Dose into the skin once. Inject one dose as needed at onset of migraine. Do not use more than 3 times a week. 02/26/17 02/26/17  Cameron Sprang, MD  SUMATRIPTAN SUCCINATE IJ Inject 1 each into the muscle once as needed (migraine).    [provider]    Family History Family History  Problem Relation Age of Onset  . Breast cancer Paternal Grandmother   . Colon cancer Paternal Grandfather   . Colon cancer Maternal Grandfather   . Colon polyps Maternal Aunt   . Diabetes Mother   . Anesthesia problems Mother        hard to wake up post-op  . Diabetes Sister   . Anesthesia problems Sister        hard to wake up post-op  . Diabetes Father   . Heart disease Father     Social History Social History   Tobacco Use  . Smoking status: Former Smoker    Packs/day: 0.00    Years: 14.00    Pack years: 0.00    Types: Cigarettes    Last attempt to quit: 03/17/2017    Years since quitting: 0.7  . Smokeless tobacco: Never Used  Substance Use Topics  . Alcohol use: No  . Drug use: No     Allergies   Patient has no known allergies.   Review of Systems Review of Systems  Constitutional: Negative for chills and fever.  HENT: Negative for ear pain and sore throat.   Eyes: Negative for pain and visual disturbance.  Respiratory: Negative for cough and shortness of breath.   Cardiovascular: Negative for chest pain and palpitations.  Gastrointestinal: Negative for abdominal pain and vomiting.  Genitourinary: Negative for dysuria and hematuria.   Musculoskeletal: Negative for arthralgias and back pain.  Skin: Positive for rash. Negative for color change.  Neurological: Negative for seizures and syncope.  All other systems reviewed and are negative.    Physical Exam Triage Vital Signs ED Triage Vitals [12/07/17 1626]  Enc Vitals Group  BP 115/80     Pulse Rate (!) 132     Resp 18     Temp 98.2 F (36.8 C)     Temp src      SpO2 100 %     Weight      Height      Head Circumference      Peak Flow      Pain Score      Pain Loc      Pain Edu?      Excl. in [place]?    No data found.  Updated Vital Signs BP 115/80   Pulse (!) 132 Comment: pt states this is normal, she is on a beta blocker  Temp 98.2 F (36.8 C)   Resp 18   LMP 08/14/2016 (Exact Date)   SpO2 100%   Visual Acuity Right Eye Distance:   Left Eye Distance:   Bilateral Distance:    Right Eye Near:   Left Eye Near:    Bilateral Near:     Physical Exam  Constitutional: She is oriented to person, place, and time. She appears well-developed and well-nourished. No distress.  HENT:  Head: Normocephalic and atraumatic.  Right Ear: External ear normal.  Left Ear: External ear normal.  Mouth/Throat: Oropharynx is clear and moist.  No swelling uvula  Eyes: Pupils are equal, round, and reactive to light.  Neck: Normal range of motion. Neck supple.  Cardiovascular: Regular rhythm and normal heart sounds.  Tachycardia, chronic  Pulmonary/Chest: Effort normal and breath sounds normal.  Abdominal: Soft. Bowel sounds are normal. There is no tenderness.  Musculoskeletal: Normal range of motion. She exhibits no edema.  Neurological: She is alert and oriented to person, place, and time.  Skin: Skin is warm.  Urticarial wheals present on the back of both calves.  There is a lot of excoriation and scratching present.  Sunburn on back and shoulders.  Discussed sunscreen  Psychiatric: She has a normal mood and affect.     UC Treatments / Results  Labs (all  labs ordered are listed, but only abnormal results are displayed) Labs Reviewed - No data to display  EKG None  Radiology No results found.  Procedures Procedures (including critical care time)  Medications Ordered in UC Medications - No data to display  Initial Impression / Assessment and Plan / UC Course  I have reviewed the triage vital signs and the nursing notes.  Pertinent labs & imaging results that were available during my care of the patient were reviewed by me and considered in my medical decision making (see chart for details).      Final Clinical Impressions(s) / UC Diagnoses   Final diagnoses:  Hives     Discharge Instructions     Take the hydroxyzine for itching Follow up with your PCP   ED Prescriptions    Medication Sig Dispense Auth. Provider   hydrOXYzine (ATARAX/VISTARIL) 25 MG tablet Take 1-2 tablets (25-50 mg total) by mouth at bedtime as needed for itching. 30 tablet Raylene Everts, MD   methylPREDNISolone acetate (DEPO-MEDROL) 80 MG/ML injection Inject 1 mL (80 mg total) into the muscle once for 1 dose. 5 mL Raylene Everts, MD     Controlled Substance Prescriptions  Controlled Substance Registry consulted? Not Applicable   Raylene Everts, MD 12/07/17 [phone]

## 2017-12-07 NOTE — ED Triage Notes (Signed)
Pt c/o rash on bilateral legs, seen here two weeks ago for the same thing, requesting a steriod shot.

## 2018-01-09 ENCOUNTER — Other Ambulatory Visit: Payer: Self-pay | Admitting: Internal Medicine

## 2018-01-25 ENCOUNTER — Other Ambulatory Visit: Payer: Self-pay

## 2018-01-26 MED ORDER — PANTOPRAZOLE SODIUM 40 MG PO TBEC: DELAYED_RELEASE_TABLET | ORAL | 0 refills | Status: DC

## 2018-02-18 ENCOUNTER — Ambulatory Visit (INDEPENDENT_AMBULATORY_CARE_PROVIDER_SITE_OTHER): Payer: Medicaid Other | Admitting: Neurology

## 2018-02-18 DIAGNOSIS — G43009 Migraine without aura, not intractable, without status migrainosus: Secondary | ICD-10-CM | POA: Diagnosis not present

## 2018-02-18 MED ORDER — DIPHENHYDRAMINE HCL 50 MG/ML IJ SOLN
25.0000 mg | Freq: Once | INTRAMUSCULAR | Status: AC
  Administered 2018-02-18: 25 mg via INTRAMUSCULAR

## 2018-02-18 MED ORDER — METOCLOPRAMIDE HCL 5 MG/ML IJ SOLN
10.0000 mg | Freq: Once | INTRAVENOUS | Status: AC
  Administered 2018-02-18: 10 mg via INTRAMUSCULAR

## 2018-02-18 MED ORDER — KETOROLAC TROMETHAMINE 60 MG/2ML IM SOLN
60.0000 mg | Freq: Once | INTRAMUSCULAR | Status: AC
  Administered 2018-02-18: 60 mg via INTRAMUSCULAR

## 2018-02-18 NOTE — Progress Notes (Signed)
Headache cocktail injection to right upper outer quadrant with no apparent complications.

## 2018-02-26 ENCOUNTER — Other Ambulatory Visit: Payer: Self-pay | Admitting: Internal Medicine

## 2018-02-27 ENCOUNTER — Ambulatory Visit: Payer: Medicaid Other | Admitting: Neurology

## 2018-03-05 ENCOUNTER — Ambulatory Visit (HOSPITAL_COMMUNITY)
Admission: EM | Admit: 2018-03-05 | Discharge: 2018-03-05 | Disposition: A | Discharge status: Home / Self Care | Payer: Medicaid Other | Attending: Family Medicine | Admitting: Family Medicine

## 2018-03-05 ENCOUNTER — Other Ambulatory Visit: Payer: Self-pay

## 2018-03-05 DIAGNOSIS — R42 Dizziness and giddiness: Secondary | ICD-10-CM | POA: Diagnosis not present

## 2018-03-05 MED ORDER — MECLIZINE HCL 25 MG PO TABS: 25.0000 mg | ORAL_TABLET | Freq: Three times a day (TID) | ORAL | 0 refills | Status: DC | PRN

## 2018-03-05 NOTE — Discharge Instructions (Signed)
Get plenty of rest Increase your fluids Take meclizine 3 times a day as needed for dizziness Try the dizziness exercises at home Follow-up with your doctor if not improved by the end of the week

## 2018-03-05 NOTE — ED Triage Notes (Signed)
dizzy and nausea x 1 day

## 2018-03-05 NOTE — ED Provider Notes (Signed)
[place]    CSN: [id number] Arrival date & time: 03/05/18  1800     History   Chief Complaint Chief Complaint  Patient presents with  . Dizziness    HPI Michelle [name] is a 27 y.o. female.   HPI  Patient has 1 day of dizziness.  Started this morning.  She states that if she moves her head even while sitting and lying she will feel a spinning sensation.  It is accompanied with nausea.  No vomiting.  She states she had a cold with some pressure in her ears last weekend.  This went away with symptomatic treatment.  No ear pressure or pain now.  No tinnitus.  No change in hearing.  No head injury.  No allergies or sinus symptoms.  No visual symptoms.  She does have migraines but is not having a headache.  Past Medical History:  Diagnosis Date  . Anxiety   . Asthma   . Bipolar disorder ([place])    no current med.  . Depression    no current med.  . Family history of adverse reaction to anesthesia    states mother and sister are hard to wake up post-op  . Gestational diabetes   . History of seizure 06/20/2012   x 1 - during delivery of child(eclampsia)  . Hypertension   . Lipoma of lower back 08/2015  . Migraine   . PTSD (post-traumatic stress disorder)   . PTSD (post-traumatic stress disorder)   . Tachycardia   . TMJ (dislocation of temporomandibular joint)   . Vertigo     Patient Active Problem List   Diagnosis Date Noted  . Rash and nonspecific skin eruption 11/16/2017  . Easy bruising 11/16/2017  . Anxiety state 10/27/2016  . Migraine 02/10/2016  . PTSD 03/02/2010  . 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 ABDOMINAL HYSTERECTOMY    . ADENOIDECTOMY, TONSILLECTOMY AND MYRINGOTOMY WITH TUBE PLACEMENT  1990's  . CESAREAN SECTION  06/20/2012   Procedure: CESAREAN SECTION;  Surgeon: Emily Filbert, MD;  Location: [facility];  Service: Obstetrics;  Laterality: N/A;  . LAPAROSCOPIC APPENDECTOMY   07/25/2012   Procedure: APPENDECTOMY LAPAROSCOPIC;  Surgeon: Zenovia Jarred, MD;  Location: [facility];  Service: General;  Laterality: N/A;  . LAPAROSCOPIC TUBAL LIGATION Bilateral 08/04/2014   Procedure: BILATERAL LAPAROSCOPIC TUBAL LIGATION;  Surgeon: Woodroe Mode, MD;  Location: [facility];  Service: Gynecology;  Laterality: Bilateral;  . LIPOMA EXCISION N/A 09/16/2015   Procedure: EXCISION LIPOMA LUMBAR REGION;  Surgeon: Donnie Mesa, MD;  Location: [facility];  Service: General;  Laterality: N/A;  . WISDOM TOOTH EXTRACTION      OB History    Gravida  3   Para  3   Term  2   Preterm  1   AB  0   Living  3     SAB  0   TAB  0   Ectopic  0   Multiple  0   Live Births  1        Obstetric Comments  C-Section Indication: Non-reassuring fetal tracing, growth delay & marked proteinuria & Pre-Eclampsia Eclamptic Seizure during C-section delivery         Home Medications    Prior to Admission medications   Medication Sig Start Date End Date Taking? Authorizing Provider  albuterol (PROAIR HFA) 108 (90 Base) MCG/ACT inhaler Inhale 2 puffs  into the lungs every 6 (six) hours as needed for wheezing or shortness of breath.    [provider]  ibuprofen (ADVIL,MOTRIN) 200 MG tablet Take 800 mg by mouth every 6 (six) hours as needed (pain).    [provider]  meclizine (ANTIVERT) 25 MG tablet Take 1 tablet (25 mg total) by mouth 3 (three) times daily as needed for dizziness. 03/05/18   Raylene Everts, MD  metoprolol succinate (TOPROL-XL) 25 MG 24 hr tablet TAKE 1 TABLET(25 MG) BY MOUTH DAILY 02/27/18   Evans Lance, MD  nortriptyline (PAMELOR) 25 MG capsule Take 3 capsules (75 mg total) by mouth at bedtime. 08/29/17   Cameron Sprang, MD  PROAIR HFA 108 [phone] Base) MCG/ACT inhaler INHALE 2 PUFFS INTO THE LUNGS EVERY 4 HOURS AS NEEDED FOR WHEEZING OR SHORTNESS OF BREATH 01/10/18   Mayo, Pete Pelt, MD  SUMATRIPTAN SUCCINATE IJ Inject 1 each into the  muscle once as needed (migraine).    [provider]    Family History Family History  Problem Relation Age of Onset  . Breast cancer Paternal Grandmother   . Colon cancer Paternal Grandfather   . Colon cancer Maternal Grandfather   . Colon polyps Maternal Aunt   . Diabetes Mother   . Anesthesia problems Mother        hard to wake up post-op  . Diabetes Sister   . Anesthesia problems Sister        hard to wake up post-op  . Diabetes Father   . Heart disease Father     Social History Social History   Tobacco Use  . Smoking status: Former Smoker    Packs/day: 0.00    Years: 14.00    Pack years: 0.00    Types: Cigarettes    Last attempt to quit: 03/17/2017    Years since quitting: 0.9  . Smokeless tobacco: Never Used  Substance Use Topics  . Alcohol use: No  . Drug use: No     Allergies   Patient has no known allergies.   Review of Systems Review of Systems  Constitutional: Negative for chills and fever.  HENT: Negative for ear pain and sore throat.   Eyes: Negative for pain and visual disturbance.  Respiratory: Negative for cough and shortness of breath.   Cardiovascular: Negative for chest pain and palpitations.  Gastrointestinal: Positive for nausea. Negative for abdominal pain and vomiting.  Genitourinary: Negative for dysuria and hematuria.  Musculoskeletal: Negative for arthralgias and back pain.  Skin: Negative for color change and rash.  Neurological: Positive for dizziness. Negative for seizures and syncope.  All other systems reviewed and are negative.    Physical Exam Triage Vital Signs ED Triage Vitals  Enc Vitals Group     BP 03/05/18 1820 107/87     Pulse Rate 03/05/18 1820 79     Resp 03/05/18 1820 16     Temp 03/05/18 1820 97.9 F (36.6 C)     Temp src --      SpO2 03/05/18 1820 99 %     Weight 03/05/18 1818 140 lb (63.5 kg)     Height --      Head Circumference --      Peak Flow --      Pain Score 03/05/18 1818 0     Pain  Loc --      Pain Edu? --      Excl. in [place]? --    No data found.  Updated Vital Signs BP 107/87   Pulse 79   Temp 97.9 F (36.6 C)   Resp 16   Wt 63.5 kg   LMP 08/14/2016 (Exact Date)   SpO2 99%   BMI 25.61 kg/m   Visual Acuity Right Eye Distance:   Left Eye Distance:   Bilateral Distance:    Right Eye Near:   Left Eye Near:    Bilateral Near:     Physical Exam  Constitutional: She appears well-developed and well-nourished. No distress.  HENT:  Head: Normocephalic and atraumatic.  Mouth/Throat: Oropharynx is clear and moist.  Eyes: Pupils are equal, round, and reactive to light. Conjunctivae and EOM are normal.  No nystagmus  Neck: Normal range of motion.  Cardiovascular: Regular rhythm and normal heart sounds.  Heart rate 109  Pulmonary/Chest: Effort normal. No respiratory distress.  Abdominal: Soft. She exhibits no distension.  Musculoskeletal: Normal range of motion. She exhibits no edema.  Neurological: She is alert. She displays normal reflexes. No cranial nerve deficit. Coordination normal.  Skin: Skin is warm and dry.  Psychiatric: She has a normal mood and affect. Her behavior is normal.  Quiet demeanor     UC Treatments / Results  Labs (all labs ordered are listed, but only abnormal results are displayed) Labs Reviewed - No data to display  EKG None  Radiology No results found.  Procedures Procedures (including critical care time)  Medications Ordered in UC Medications - No data to display  Initial Impression / Assessment and Plan / UC Course  I have reviewed the triage vital signs and the nursing notes.  Pertinent labs & imaging results that were available during my care of the patient were reviewed by me and considered in my medical decision making (see chart for details).      Final Clinical Impressions(s) / UC Diagnoses   Final diagnoses:  Vertigo     Discharge Instructions     Get plenty of rest Increase your fluids Take  meclizine 3 times a day as needed for dizziness Try the dizziness exercises at home Follow-up with your doctor if not improved by the end of the week     ED Prescriptions    Medication Sig Dispense Auth. Provider   meclizine (ANTIVERT) 25 MG tablet Take 1 tablet (25 mg total) by mouth 3 (three) times daily as needed for dizziness. 30 tablet Raylene Everts, MD     Controlled Substance Prescriptions [place] Controlled Substance Registry consulted? Not Applicable   Raylene Everts, MD 03/05/18 Lurena Nida

## 2018-03-27 ENCOUNTER — Ambulatory Visit (HOSPITAL_COMMUNITY)
Admission: EM | Admit: 2018-03-27 | Discharge: 2018-03-27 | Disposition: A | Discharge status: Home / Self Care | Payer: Medicaid Other | Attending: Family Medicine | Admitting: Family Medicine

## 2018-03-27 ENCOUNTER — Encounter (HOSPITAL_COMMUNITY): Payer: Self-pay

## 2018-03-27 DIAGNOSIS — T63441A Toxic effect of venom of bees, accidental (unintentional), initial encounter: Secondary | ICD-10-CM | POA: Diagnosis not present

## 2018-03-27 NOTE — ED Provider Notes (Signed)
[place]   [id number] 03/27/18 Arrival Time: 1478  ASSESSMENT & PLAN:  1. Bee sting, accidental or unintentional, initial encounter     Follow-up Information    Rory Percy, DO.   Specialty:  Family Medicine Why:  If symptoms worsen. Contact information: [address] [place] [address] [phone]          Observation. Benadryl if needed. Watch for s/s of infection.  Reviewed expectations re: course of current medical issues. Questions answered. Outlined signs and symptoms indicating need for more acute intervention. Patient verbalized understanding. After Visit Summary given.  SUBJECTIVE: History from: patient. Michelle [name] is a 27 y.o. female who reports a bee sting to her L ventral wrist. Approx 2 hours ago. Mild tingling and numbness initially. Some erythema. Overall better now. Afebrile. No n/v. No respiratory or swallowing difficulties. No home/self treatment. No specific aggravating or alleviating factors reported. No h/o adverse reaction to bee sting. Extremity weakness: none.  ROS: As per HPI.   OBJECTIVE:  Vitals:   03/27/18 1425  BP: 114/75  Pulse: (!) 119  Resp: 20  Temp: 98 F (36.7 C)  TempSrc: Oral  SpO2: 100%    General appearance: alert; no distress Extremities: warm and well perfused; symmetrical with no gross deformities; L ventral wrist with an approx 1cm x 0.2 cm area of mild erythema and slight induration; no stinger identified; normal skin temperature; FROM of L wrist CV: brisk extremity capillary refill Skin: warm and dry Neurologic: normal gait; normal symmetric reflexes in all extremities; normal sensation in all extremities Psychological: alert and cooperative; normal mood and affect  No Known Allergies  Past Medical History:  Diagnosis Date  . Anxiety   . Asthma   . Bipolar disorder ([place])    no current med.  . Depression    no current med.  . Family history of adverse reaction to anesthesia     states mother and sister are hard to wake up post-op  . Gestational diabetes   . History of seizure 06/20/2012   x 1 - during delivery of child(eclampsia)  . Hypertension   . Lipoma of lower back 08/2015  . Migraine   . PTSD (post-traumatic stress disorder)   . PTSD (post-traumatic stress disorder)   . Tachycardia   . TMJ (dislocation of temporomandibular joint)   . Vertigo    Social History   Socioeconomic History  . Marital status: Single    Spouse name: Not on file  . Number of children: 2  . Years of education: Not on file  . Highest education level: Not on file  Occupational History  . Not on file  Social Needs  . Financial resource strain: Not on file  . Food insecurity:    Worry: Not on file    Inability: Not on file  . Transportation needs:    Medical: Not on file    Non-medical: Not on file  Tobacco Use  . Smoking status: Former Smoker    Packs/day: 0.00    Years: 14.00    Pack years: 0.00    Types: Cigarettes    Last attempt to quit: 03/17/2017    Years since quitting: 1.0  . Smokeless tobacco: Never Used  Substance and Sexual Activity  . Alcohol use: No  . Drug use: No  . Sexual activity: Yes    Birth control/protection: Surgical  Lifestyle  . Physical activity:    Days per week: Not on file    Minutes  per session: Not on file  . Stress: Not on file  Relationships  . Social connections:    Talks on phone: Not on file    Gets together: Not on file    Attends religious service: Not on file    Active member of club or organization: Not on file    Attends meetings of clubs or organizations: Not on file    Relationship status: Not on file  Other Topics Concern  . Not on file  Social History Narrative   Lives with boyfriend and 3 children in a one story home.  Does not work.  Education: 9th grade.   Family History  Problem Relation Age of Onset  . Breast cancer Paternal Grandmother   . Colon cancer Paternal Grandfather   . Colon cancer Maternal  Grandfather   . Colon polyps Maternal Aunt   . Diabetes Mother   . Anesthesia problems Mother        hard to wake up post-op  . Diabetes Sister   . Anesthesia problems Sister        hard to wake up post-op  . Diabetes Father   . Heart disease Father    Past Surgical History:  Procedure Laterality Date  . ABDOMINAL HYSTERECTOMY    . ADENOIDECTOMY, TONSILLECTOMY AND MYRINGOTOMY WITH TUBE PLACEMENT  1990's  . CESAREAN SECTION  06/20/2012   Procedure: CESAREAN SECTION;  Surgeon: Emily Filbert, MD;  Location: [facility];  Service: Obstetrics;  Laterality: N/A;  . LAPAROSCOPIC APPENDECTOMY  07/25/2012   Procedure: APPENDECTOMY LAPAROSCOPIC;  Surgeon: Zenovia Jarred, MD;  Location: [facility];  Service: General;  Laterality: N/A;  . LAPAROSCOPIC TUBAL LIGATION Bilateral 08/04/2014   Procedure: BILATERAL LAPAROSCOPIC TUBAL LIGATION;  Surgeon: Woodroe Mode, MD;  Location: [facility];  Service: Gynecology;  Laterality: Bilateral;  . LIPOMA EXCISION N/A 09/16/2015   Procedure: EXCISION LIPOMA LUMBAR REGION;  Surgeon: Donnie Mesa, MD;  Location: [facility];  Service: General;  Laterality: N/A;  . WISDOM TOOTH EXTRACTION        Vanessa Kick, MD 03/27/18 1501

## 2018-03-27 NOTE — ED Triage Notes (Signed)
Pt presents with bee sting on left wrist .

## 2018-03-27 NOTE — Discharge Instructions (Addendum)
You may use over the counter Benadryl to help with the irritation present around the site of the bee sting.

## 2018-03-27 NOTE — Medical Student Note (Signed)
[facility] Statistician Note For educational purposes for Medical, PA and NP students only and not part of the legal medical record.   CSN: [id number] Arrival date & time: 03/27/18  1326     History   Chief Complaint Chief Complaint  Patient presents with  . Bee Sting    HPI Michelle [name] is a 27 y.o. female presents today for a bee sting.  O- at 12:50 PM today she was stung by a Bumble Bee  L- right posterior wrist D- continuous  C- tingling/numbess around the site since sting  A- nothing  R- time T- none  S- concerned about the numbness and tingling   HPI  Past Medical History:  Diagnosis Date  . Anxiety   . Asthma   . Bipolar disorder ([place])    no current med.  . Depression    no current med.  . Family history of adverse reaction to anesthesia    states mother and sister are hard to wake up post-op  . Gestational diabetes   . History of seizure 06/20/2012   x 1 - during delivery of child(eclampsia)  . Hypertension   . Lipoma of lower back 08/2015  . Migraine   . PTSD (post-traumatic stress disorder)   . PTSD (post-traumatic stress disorder)   . Tachycardia   . TMJ (dislocation of temporomandibular joint)   . Vertigo     Patient Active Problem List   Diagnosis Date Noted  . Rash and nonspecific skin eruption 11/16/2017  . Easy bruising 11/16/2017  . Anxiety state 10/27/2016  . Migraine 02/10/2016  . PTSD 03/02/2010  . 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 ABDOMINAL HYSTERECTOMY    . ADENOIDECTOMY, TONSILLECTOMY AND MYRINGOTOMY WITH TUBE PLACEMENT  1990's  . CESAREAN SECTION  06/20/2012   Procedure: CESAREAN SECTION;  Surgeon: Emily Filbert, MD;  Location: [facility];  Service: Obstetrics;  Laterality: N/A;  . LAPAROSCOPIC APPENDECTOMY  07/25/2012   Procedure: APPENDECTOMY LAPAROSCOPIC;  Surgeon: Zenovia Jarred, MD;  Location: [facility];  Service: General;  Laterality:  N/A;  . LAPAROSCOPIC TUBAL LIGATION Bilateral 08/04/2014   Procedure: BILATERAL LAPAROSCOPIC TUBAL LIGATION;  Surgeon: Woodroe Mode, MD;  Location: [facility];  Service: Gynecology;  Laterality: Bilateral;  . LIPOMA EXCISION N/A 09/16/2015   Procedure: EXCISION LIPOMA LUMBAR REGION;  Surgeon: Donnie Mesa, MD;  Location: [facility];  Service: General;  Laterality: N/A;  . WISDOM TOOTH EXTRACTION      OB History    Gravida  3   Para  3   Term  2   Preterm  1   AB  0   Living  3     SAB  0   TAB  0   Ectopic  0   Multiple  0   Live Births  1        Obstetric Comments  C-Section Indication: Non-reassuring fetal tracing, growth delay & marked proteinuria & Pre-Eclampsia Eclamptic Seizure during C-section delivery         Home Medications    Prior to Admission medications   Medication Sig Start Date End Date Taking? Authorizing Provider  albuterol (PROAIR HFA) 108 (90 Base) MCG/ACT inhaler Inhale 2 puffs into the lungs every 6 (six) hours as needed for wheezing or shortness of breath.    [provider]  ibuprofen (ADVIL,MOTRIN) 200 MG tablet Take 800 mg  by mouth every 6 (six) hours as needed (pain).    [provider]  meclizine (ANTIVERT) 25 MG tablet Take 1 tablet (25 mg total) by mouth 3 (three) times daily as needed for dizziness. 03/05/18   Raylene Everts, MD  metoprolol succinate (TOPROL-XL) 25 MG 24 hr tablet TAKE 1 TABLET(25 MG) BY MOUTH DAILY 02/27/18   Evans Lance, MD  nortriptyline (PAMELOR) 25 MG capsule Take 3 capsules (75 mg total) by mouth at bedtime. 08/29/17   Cameron Sprang, MD  PROAIR HFA 108 [phone] Base) MCG/ACT inhaler INHALE 2 PUFFS INTO THE LUNGS EVERY 4 HOURS AS NEEDED FOR WHEEZING OR SHORTNESS OF BREATH 01/10/18   Mayo, Pete Pelt, MD  SUMATRIPTAN SUCCINATE IJ Inject 1 each into the muscle once as needed (migraine).    [provider]    Family History Family History  Problem Relation Age of Onset  .  Breast cancer Paternal Grandmother   . Colon cancer Paternal Grandfather   . Colon cancer Maternal Grandfather   . Colon polyps Maternal Aunt   . Diabetes Mother   . Anesthesia problems Mother        hard to wake up post-op  . Diabetes Sister   . Anesthesia problems Sister        hard to wake up post-op  . Diabetes Father   . Heart disease Father     Social History Social History   Tobacco Use  . Smoking status: Former Smoker    Packs/day: 0.00    Years: 14.00    Pack years: 0.00    Types: Cigarettes    Last attempt to quit: 03/17/2017    Years since quitting: 1.0  . Smokeless tobacco: Never Used  Substance Use Topics  . Alcohol use: No  . Drug use: No     Allergies   Patient has no known allergies.   Review of Systems Review of Systems  Constitutional: Negative for diaphoresis, fatigue and fever.  Skin: Positive for color change.       Raised bump at the site of sting.   Neurological: Positive for numbness.       Tingling present around the site of the stings.      Physical Exam Updated Vital Signs BP 114/75 (BP Location: Right Arm)   Pulse (!) 119   Temp 98 F (36.7 C) (Oral)   Resp 20   LMP 08/14/2016 (Exact Date)   SpO2 100%   Physical Exam  Constitutional: She appears well-developed and well-nourished.  Skin: Skin is warm. There is erythema.     Nursing note and vitals reviewed.    ED Treatments / Results  Labs (all labs ordered are listed, but only abnormal results are displayed) Labs Reviewed - No data to display  EKG None Radiology No results found.  Procedures Procedures (including critical care time)  Medications Ordered in ED Medications - No data to display   Initial Impression / Assessment and Plan / ED Course  I have reviewed the triage vital signs and the nursing notes.  Pertinent labs & imaging results that were available during my care of the patient were reviewed by me and considered in my medical decision making  (see chart for details).    Bee Sting OTC PO Benadryl recommended  Ice can be applied to the site  Information provided on when/if to FU   Final Clinical Impressions(s) / ED Diagnoses   Final diagnoses:  Bee sting, accidental or unintentional, initial encounter  New Prescriptions New Prescriptions   No medications on file

## 2018-03-28 ENCOUNTER — Encounter (HOSPITAL_COMMUNITY): Payer: Self-pay | Admitting: Emergency Medicine

## 2018-03-28 ENCOUNTER — Ambulatory Visit (HOSPITAL_COMMUNITY)
Admission: EM | Admit: 2018-03-28 | Discharge: 2018-03-28 | Disposition: A | Discharge status: Home / Self Care | Payer: Medicaid Other | Attending: Family Medicine | Admitting: Family Medicine

## 2018-03-28 DIAGNOSIS — S60862A Insect bite (nonvenomous) of left wrist, initial encounter: Secondary | ICD-10-CM

## 2018-03-28 DIAGNOSIS — T7840XA Allergy, unspecified, initial encounter: Secondary | ICD-10-CM

## 2018-03-28 MED ORDER — METHYLPREDNISOLONE SODIUM SUCC 125 MG IJ SOLR
INTRAMUSCULAR | Status: AC
  Filled 2018-03-28: qty 2

## 2018-03-28 MED ORDER — METHYLPREDNISOLONE SODIUM SUCC 125 MG IJ SOLR
80.0000 mg | Freq: Once | INTRAMUSCULAR | Status: AC
  Administered 2018-03-28: 80 mg via INTRAMUSCULAR

## 2018-03-28 NOTE — ED Provider Notes (Signed)
[place]    CSN: [id number] Arrival date & time: 03/28/18  1240     History   Chief Complaint Chief Complaint  Patient presents with  . Insect Bite    HPI Michelle [name] is a 27 y.o. female.   Pt is a 27 year old female that presents with allergic reaction. She was stung yesterday and reports the swelling and redness is increasing. The itching hasn't been relieved with benadryl. She is not having any associated trouble breathing, swallowing or SOB. No chest pain or oral edema.  ROS per HPI      Past Medical History:  Diagnosis Date  . Anxiety   . Asthma   . Bipolar disorder ([place])    no current med.  . Depression    no current med.  . Family history of adverse reaction to anesthesia    states mother and sister are hard to wake up post-op  . Gestational diabetes   . History of seizure 06/20/2012   x 1 - during delivery of child(eclampsia)  . Hypertension   . Lipoma of lower back 08/2015  . Migraine   . PTSD (post-traumatic stress disorder)   . PTSD (post-traumatic stress disorder)   . Tachycardia   . TMJ (dislocation of temporomandibular joint)   . Vertigo     Patient Active Problem List   Diagnosis Date Noted  . Rash and nonspecific skin eruption 11/16/2017  . Easy bruising 11/16/2017  . Anxiety state 10/27/2016  . Migraine 02/10/2016  . PTSD 03/02/2010  . 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 ABDOMINAL HYSTERECTOMY    . ADENOIDECTOMY, TONSILLECTOMY AND MYRINGOTOMY WITH TUBE PLACEMENT  1990's  . CESAREAN SECTION  06/20/2012   Procedure: CESAREAN SECTION;  Surgeon: Emily Filbert, MD;  Location: [facility];  Service: Obstetrics;  Laterality: N/A;  . LAPAROSCOPIC APPENDECTOMY  07/25/2012   Procedure: APPENDECTOMY LAPAROSCOPIC;  Surgeon: Zenovia Jarred, MD;  Location: [facility];  Service: General;  Laterality: N/A;  . LAPAROSCOPIC TUBAL LIGATION Bilateral 08/04/2014   Procedure:  BILATERAL LAPAROSCOPIC TUBAL LIGATION;  Surgeon: Woodroe Mode, MD;  Location: [facility];  Service: Gynecology;  Laterality: Bilateral;  . LIPOMA EXCISION N/A 09/16/2015   Procedure: EXCISION LIPOMA LUMBAR REGION;  Surgeon: Donnie Mesa, MD;  Location: [facility];  Service: General;  Laterality: N/A;  . WISDOM TOOTH EXTRACTION      OB History    Gravida  3   Para  3   Term  2   Preterm  1   AB  0   Living  3     SAB  0   TAB  0   Ectopic  0   Multiple  0   Live Births  1        Obstetric Comments  C-Section Indication: Non-reassuring fetal tracing, growth delay & marked proteinuria & Pre-Eclampsia Eclamptic Seizure during C-section delivery         Home Medications    Prior to Admission medications   Medication Sig Start Date End Date Taking? Authorizing Provider  albuterol (PROAIR HFA) 108 (90 Base) MCG/ACT inhaler Inhale 2 puffs into the lungs every 6 (six) hours as needed for wheezing or shortness of breath.    [provider]  ibuprofen (ADVIL,MOTRIN) 200 MG tablet Take 800 mg by mouth every 6 (six) hours as needed (pain).    [provider]  meclizine (ANTIVERT) 25 MG tablet Take 1 tablet (25 mg total) by mouth 3 (three) times daily as needed for dizziness. 03/05/18   Raylene Everts, MD  metoprolol succinate (TOPROL-XL) 25 MG 24 hr tablet TAKE 1 TABLET(25 MG) BY MOUTH DAILY 02/27/18   Evans Lance, MD  nortriptyline (PAMELOR) 25 MG capsule Take 3 capsules (75 mg total) by mouth at bedtime. 08/29/17   Cameron Sprang, MD  PROAIR HFA 108 [phone] Base) MCG/ACT inhaler INHALE 2 PUFFS INTO THE LUNGS EVERY 4 HOURS AS NEEDED FOR WHEEZING OR SHORTNESS OF BREATH 01/10/18   Mayo, Pete Pelt, MD  SUMATRIPTAN SUCCINATE IJ Inject 1 each into the muscle once as needed (migraine).    [provider]    Family History Family History  Problem Relation Age of Onset  . Breast cancer Paternal Grandmother   . Colon cancer Paternal  Grandfather   . Colon cancer Maternal Grandfather   . Colon polyps Maternal Aunt   . Diabetes Mother   . Anesthesia problems Mother        hard to wake up post-op  . Diabetes Sister   . Anesthesia problems Sister        hard to wake up post-op  . Diabetes Father   . Heart disease Father     Social History Social History   Tobacco Use  . Smoking status: Former Smoker    Packs/day: 0.00    Years: 14.00    Pack years: 0.00    Types: Cigarettes    Last attempt to quit: 03/17/2017    Years since quitting: 1.0  . Smokeless tobacco: Never Used  Substance Use Topics  . Alcohol use: No  . Drug use: No     Allergies   Patient has no known allergies.   Review of Systems Review of Systems   Physical Exam Triage Vital Signs ED Triage Vitals [03/28/18 1310]  Enc Vitals Group     BP 126/81     Pulse Rate (!) 121     Resp 18     Temp 98.8 F (37.1 C)     Temp Source Oral     SpO2 98 %     Weight      Height      Head Circumference      Peak Flow      Pain Score      Pain Loc      Pain Edu?      Excl. in [place]?    No data found.  Updated Vital Signs BP 126/81 (BP Location: Left Arm)   Pulse (!) 121   Temp 98.8 F (37.1 C) (Oral)   Resp 18   LMP 08/14/2016 (Exact Date)   SpO2 98%   Visual Acuity Right Eye Distance:   Left Eye Distance:   Bilateral Distance:    Right Eye Near:   Left Eye Near:    Bilateral Near:     Physical Exam  Constitutional: She is oriented to person, place, and time. She appears well-developed and well-nourished.  Very pleasant. Non toxic or ill appearing.     HENT:  Head: Normocephalic and atraumatic.  Nose: Nose normal.  Eyes: Conjunctivae are normal.  Neck: Normal range of motion.  Pulmonary/Chest: Effort normal.  Musculoskeletal: Normal range of motion.  Neurological: She is alert and oriented to person, place, and time.  Skin: Skin is warm and dry.  Large wheal to left wrist area. 5 cm x 2 cm.  Psychiatric: She has a  normal mood and affect.  Nursing note and vitals reviewed.    UC Treatments / Results  Labs (all labs ordered are listed, but only abnormal results are displayed) Labs Reviewed - No data to display  EKG None  Radiology No results found.  Procedures Procedures (including critical care time)  Medications Ordered in UC Medications  methylPREDNISolone sodium succinate (SOLU-MEDROL) 125 mg/2 mL injection 80 mg (has no administration in time range)    Initial Impression / Assessment and Plan / UC Course  I have reviewed the triage vital signs and the nursing notes.  Pertinent labs & imaging results that were available during my care of the patient were reviewed by me and considered in my medical decision making (see chart for details).     Allergic reaction- steroid injection in clinic. Continue benadryl.  Follow up as needed for continued or worsening symptoms  Final Clinical Impressions(s) / UC Diagnoses   Final diagnoses:  Allergic reaction, initial encounter     Discharge Instructions     It was nice meeting you!!  Steroid injection for allergic reaction.  Continue the benadryl for itching Follow up as needed for continued or worsening symptoms    ED Prescriptions    None     Controlled Substance Prescriptions [place] Controlled Substance Registry consulted? Not Applicable   Orvan July, NP 03/28/18 1342

## 2018-03-28 NOTE — ED Triage Notes (Signed)
Pt here for bee sting to left wrist with increased redness and itching

## 2018-03-28 NOTE — Discharge Instructions (Signed)
It was nice meeting you!!  Steroid injection for allergic reaction.  Continue the benadryl for itching Follow up as needed for continued or worsening symptoms

## 2018-03-29 ENCOUNTER — Telehealth: Payer: Self-pay | Admitting: Neurology

## 2018-03-29 ENCOUNTER — Ambulatory Visit (INDEPENDENT_AMBULATORY_CARE_PROVIDER_SITE_OTHER): Payer: Medicaid Other | Admitting: *Deleted

## 2018-03-29 ENCOUNTER — Other Ambulatory Visit: Payer: Self-pay | Admitting: Family Medicine

## 2018-03-29 DIAGNOSIS — G43009 Migraine without aura, not intractable, without status migrainosus: Secondary | ICD-10-CM

## 2018-03-29 IMAGING — CT CT ABD-PELV W/ CM
2 of 4 series · 15 of 46 positions shown, 17 images · IV contrast (agent unspecified)
Comparison: CT abdomen and pelvis July 25, 2012 and abdominal
ultrasound January 23, 2016 at 4449 hours

CLINICAL DATA: Tachycardia, shortness of breath. Epigastric pain
and leukocytosis. History of appendectomy.



[Series 6: a/p w/ 5mm · axial · 0.80mm/px · z∈[-592,-177]mm · 12 of 93 slices shown, 14 images]
[im 5/93  soft-tissue]
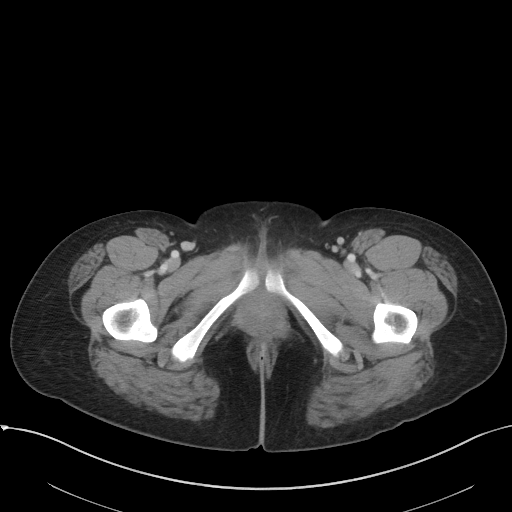
[im 5/93  bone]
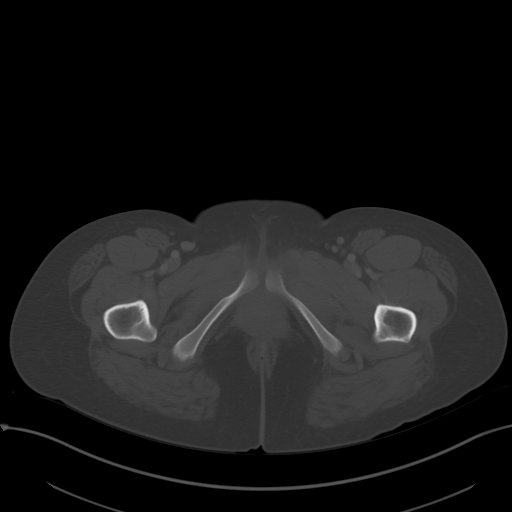
[im 15/93  soft-tissue]
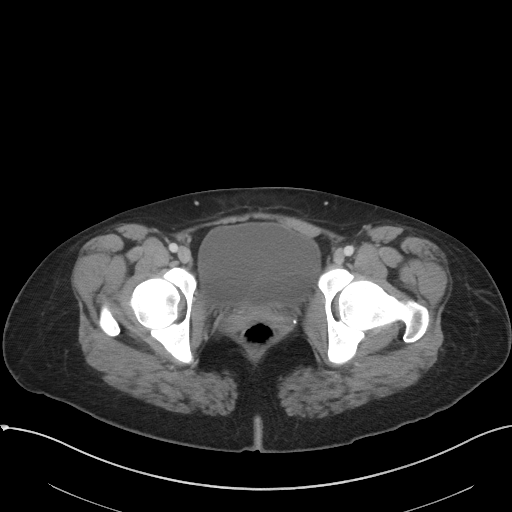
[im 20/93  soft-tissue]
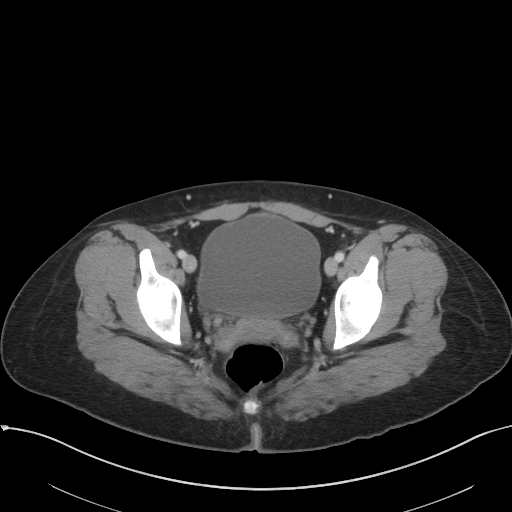
[im 30/93  soft-tissue]
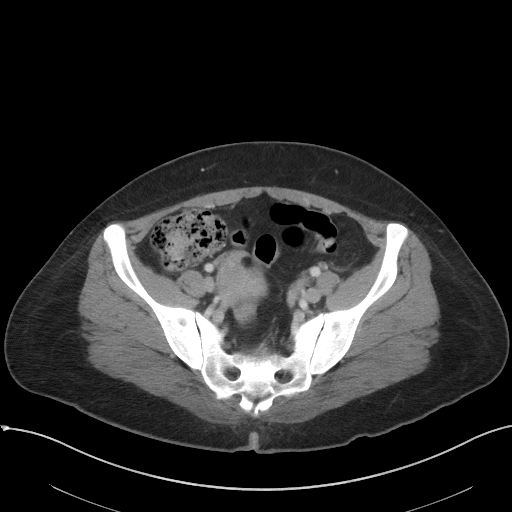
[im 34/93  soft-tissue]
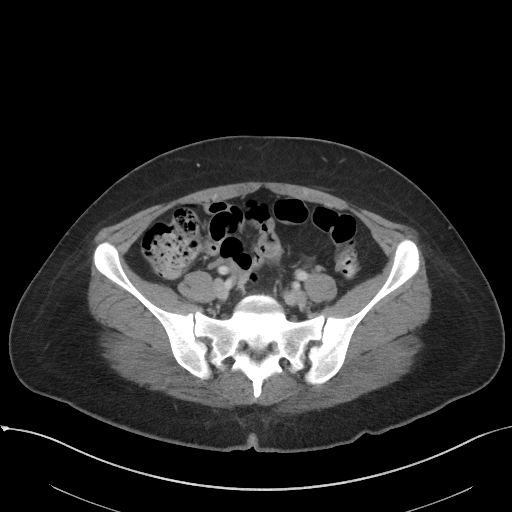
[im 44/93  soft-tissue]
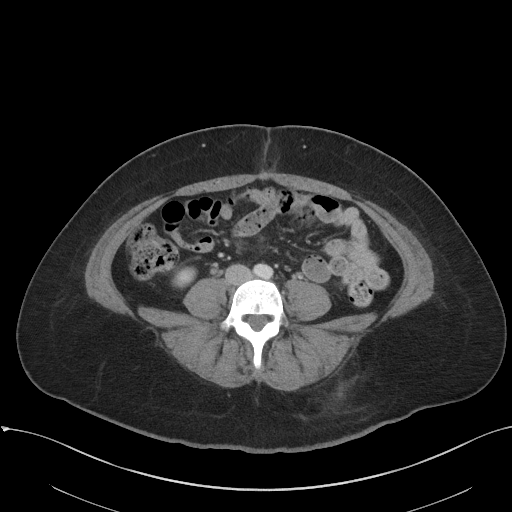
[im 49/93  soft-tissue]
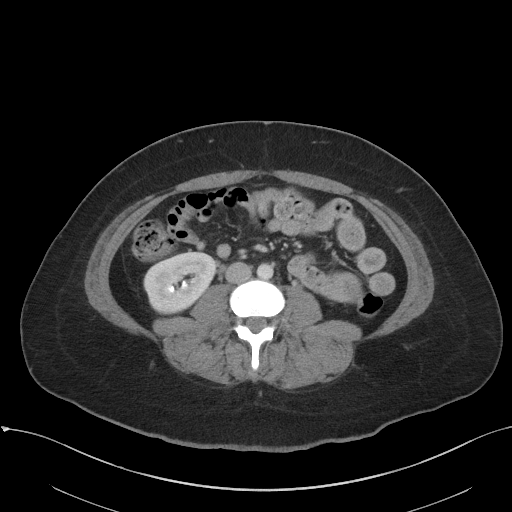
[im 59/93  soft-tissue]
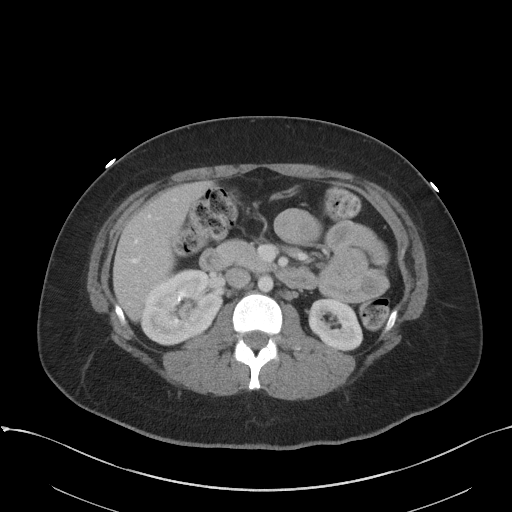
[im 63/93  soft-tissue]
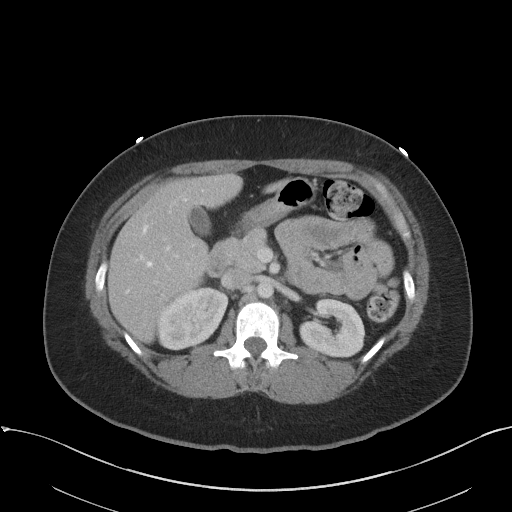
[im 63/93  bone]
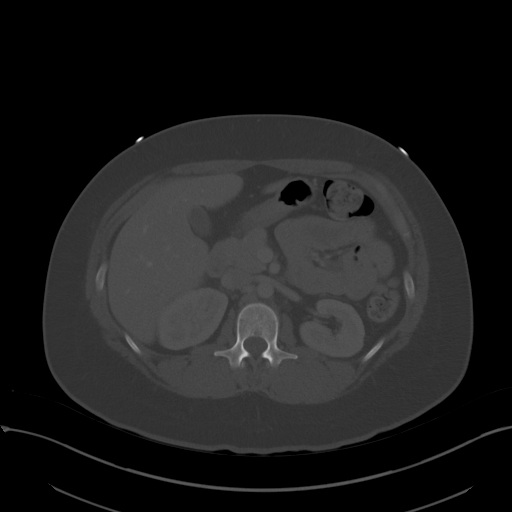
[im 73/93  soft-tissue]
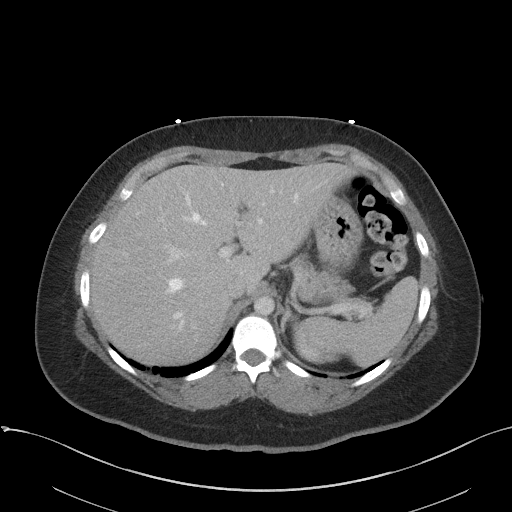
[im 78/93  soft-tissue]
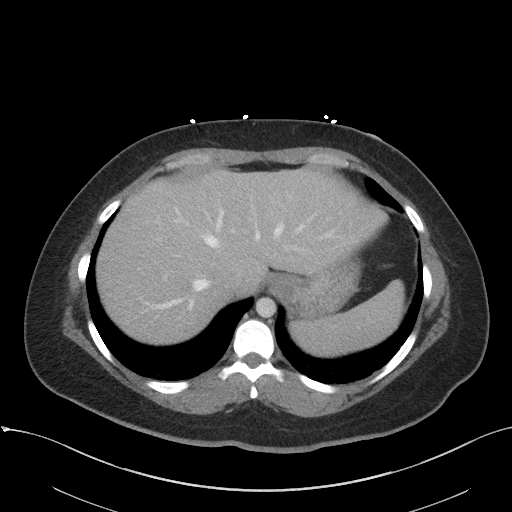
[im 88/93  soft-tissue]
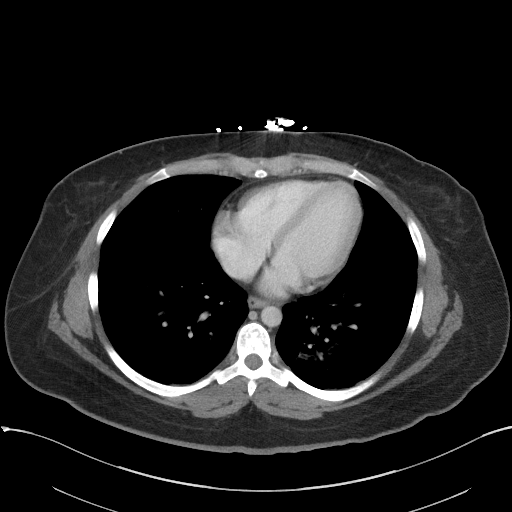

[Series 9: a/p w/ cor · coronal · 0.72mm/px · 3 of 130 slices shown]
[im 44/130  soft-tissue]
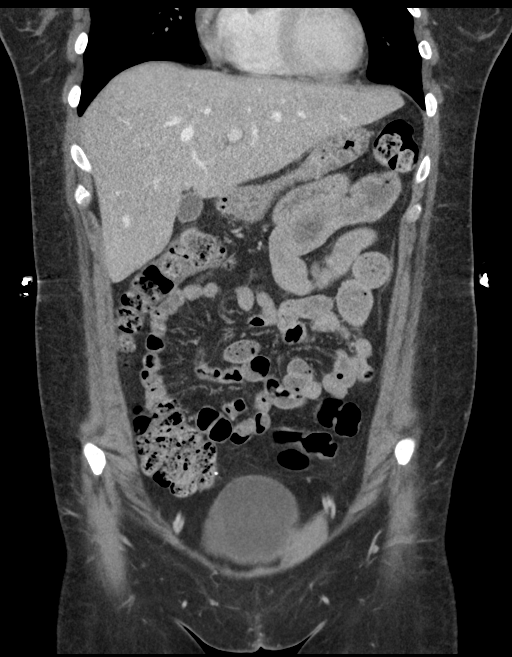
[im 58/130  soft-tissue]
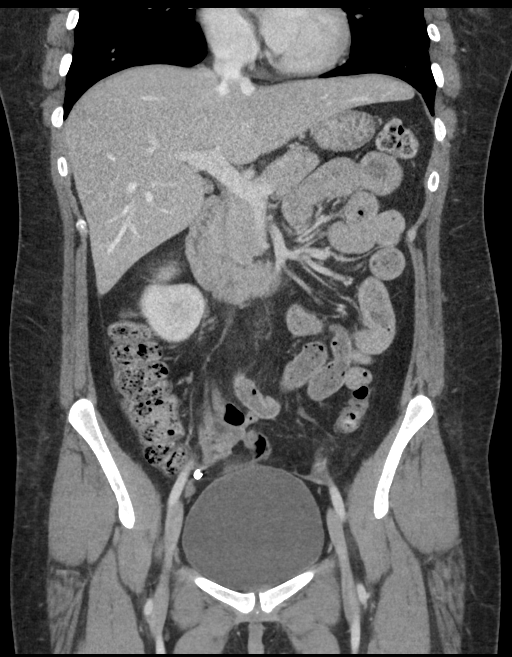
[im 72/130  soft-tissue]
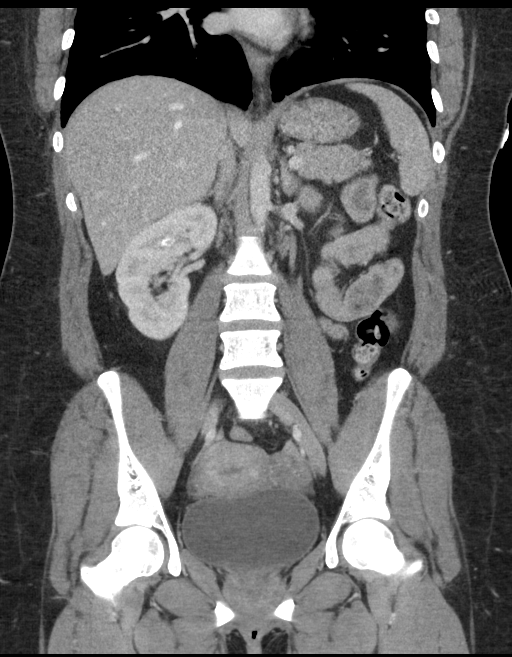

[15 of 46 positions shown; findings below may reference images not displayed]

FINDINGS: CTA CHEST FINDINGS

PULMONARY ARTERY: Adequate contrast opacification of the pulmonary
artery's. Main pulmonary artery is not enlarged. No pulmonary
arterial filling defects to the level of the subsegmental branches.

MEDIASTINUM: Heart and pericardium are unremarkable, no right heart
strain. Thoracic aorta is normal course and caliber, unremarkable.
No lymphadenopathy by CT size criteria. 6 mm short access
aortopulmonary window lymph node is likely reactive. Less than 1 cm
bilateral hilar lymph nodes.

LUNGS: Tracheobronchial tree is patent, no pneumothorax. Patchy
ground-glass opacities in the lungs bilaterally with centrilobular
ground-glass nodules within the lung bases. Mild bronchial wall
thickening. No pleural effusion.

SOFT TISSUES AND OSSEOUS STRUCTURES: Visualized soft tissues and
included osseous structures appear normal.

CT ABDOMEN and PELVIS FINDINGS

SOLID ORGANS: The liver, spleen, gallbladder, pancreas and adrenal
glands are unremarkable.

GASTROINTESTINAL TRACT: The stomach, small and large bowel are
normal in course and caliber without inflammatory changes. Small
amount of small bowel feces most compatible with chronic stasis.
Moderate amount of retained large bowel stool. Status post
appendectomy.

KIDNEYS/ URINARY TRACT: Kidneys are orthotopic, demonstrating
symmetric enhancement. Faint bilateral renal inferior mid
calcifications ; focal nephrolithiasis measuring up to 3 mm. No
hydronephrosis or solid renal masses. The unopacified ureters are
normal in course and caliber. Urinary bladder is partially distended
and unremarkable.

PERITONEUM/RETROPERITONEUM: Aortoiliac vessels are normal in course
and caliber. No lymphadenopathy by CT size criteria. 3 cm LEFT
adnexal cyst with dependent shading. Tubal ligation clips in place.
Small amount of free fluid in the pelvis is likely physiologic.

SOFT TISSUE/OSSEOUS STRUCTURES: Non-suspicious. Mild sacroiliac
osteoarthrosis. Moderate fat containing umbilical hernia.

Review of the MIP images confirms the above findings.
IMPRESSION: CTA CHEST: No acute pulmonary embolism.

Bronchopneumonia.

CT ABDOMEN AND PELVIS:  3 cm LEFT adnexal hemorrhagic cyst.

Nephrocalcinosis without obstructive uropathy.

Moderate amount of retained large bowel stool without bowel
obstruction. Status post appendectomy.

## 2018-03-29 MED ORDER — DIPHENHYDRAMINE HCL 50 MG/ML IJ SOLN
25.0000 mg | Freq: Once | INTRAMUSCULAR | Status: AC
  Administered 2018-03-29: 25 mg via INTRAMUSCULAR

## 2018-03-29 MED ORDER — KETOROLAC TROMETHAMINE 60 MG/2ML IM SOLN
60.0000 mg | Freq: Once | INTRAMUSCULAR | Status: AC
  Administered 2018-03-29: 60 mg via INTRAMUSCULAR

## 2018-03-29 MED ORDER — METOCLOPRAMIDE HCL 5 MG/ML IJ SOLN
10.0000 mg | Freq: Once | INTRAVENOUS | Status: AC
  Administered 2018-03-29: 10 mg via INTRAVENOUS

## 2018-03-29 NOTE — Telephone Encounter (Signed)
[place] for migraine cocktail when staff can get her in, thanks!

## 2018-03-29 NOTE — Telephone Encounter (Signed)
Please advise 

## 2018-03-29 NOTE — Telephone Encounter (Signed)
Patient is calling in asking if she could come into the office today to get the migraine shot. Can we fit her into a spot somewhere today? Her number is [phone]. Thanks!

## 2018-04-01 ENCOUNTER — Other Ambulatory Visit: Payer: Self-pay

## 2018-04-01 MED ORDER — ALBUTEROL SULFATE HFA 108 (90 BASE) MCG/ACT IN AERS: INHALATION_SPRAY | RESPIRATORY_TRACT | 3 refills | Status: DC

## 2018-04-15 ENCOUNTER — Ambulatory Visit: Payer: Medicaid Other | Admitting: Student in an Organized Health Care Education/Training Program

## 2018-04-15 ENCOUNTER — Other Ambulatory Visit: Payer: Self-pay

## 2018-04-15 VITALS — BP 98/68 | HR 88 | Temp 98.5°F | Ht 62.0 in | Wt 140.6 lb

## 2018-04-15 DIAGNOSIS — T7840XS Allergy, unspecified, sequela: Secondary | ICD-10-CM | POA: Diagnosis not present

## 2018-04-15 DIAGNOSIS — Z23 Encounter for immunization: Secondary | ICD-10-CM

## 2018-04-15 NOTE — Patient Instructions (Signed)
It was a pleasure seeing you today in our clinic.  Please try over the counter zyrtec and a topical steroid for your itching.  A consult was placed to allergy specialist at today's visit.  You will receive a call to schedule an appointment. If you do not receive a call within two weeks please call our office so we can place the consult again.  Our clinic's number is [phone]. Please call with questions or concerns about what we discussed today.  Be well, Dr. Burr Medico

## 2018-04-15 NOTE — Progress Notes (Signed)
CC: paresthesias of right buttock  HPI: Michelle [name] is a 27 y.o. female .  Non-dermatomal paresthesias and itching Patient went to the urgent care after having a small local reaction to a bee sting ( her forarm became red and swollen in the area where she was stung). She was given a steroid shot in the right buttock on 9/12. She reports that starting on 9/23, she began to have numbness in the area and she is concerned this may be related to  The steroid injection. She reports when she lightly touches over the area she thinks she has a sensation of numbness. She has been most bothered by itching in the area. She has tried benadryl and hydroxyzine at home without improvement in her symptoms. She has not seen a rash or surrounding erythema. She has never had similar episodes in the past.  Allergic reaction She reports that she had a small local reaction to a bee sting she is concerned that if she gets stung again she may have a systemic reaction. She asks for an epipen. No wheezing, lip or mouth swelling, hives, or systemic reaction after the bee tsing.  She also expresses that she previously ate some oatmeal cookies and got hives after eating them. She asks whether she need an epipen for this. She is unsure what ingredient caused her hives. She seems anxious about her potential unknown allergy.  Review of Symptoms:  See HPI for ROS.   CC, SH/smoking status, and VS noted.  Objective: BP 98/68   Pulse 88   Temp 98.5 F (36.9 C) (Oral)   Ht 5\' 2"  (1.575 m)   Wt 140 lb 9.6 oz (63.8 kg)   LMP 08/14/2016 (Exact Date)   SpO2 99%   BMI 25.72 kg/m  GEN: NAD, alert, cooperative, and pleasant. EYE: no conjunctival injection NECK: full ROM RESPIRATORY: comfortable work of breathing CV: regular rhythm noted GI: nondistended SKIN: right buttock skin noted to be normal, healthy-appearing. No rash or lesion. No erythema or redness. NEURO: II-XII grossly intact PSYCH: AAOx3, appropriate  affect  Assessment and plan:  Non-dermatomal paresthesias and itching of the right buttock:  Patient may have some type of hypersensitivity reaction to the steroid injection, however there are no signs of abnormality on exam. She has already tried benadryl and hydroxyzine. She is most concerned with the itching. - OTC Zyrtec - OTC hydrocortisone cream  - follow up if symptoms worsen or fail to improve  Allergic reaction  - patient comes in today requesting epipen. At first she requests epipen for bee sting, however when I discussed with her that she had no systemic reaction to the sting, she then started to discuss a time when she had hives after eating oatmeal cookies. She was anxious about her uncertainty of what caused her to have hives in the oatmeal cookie. She asks for allergist referral - referral to allergy - no epipen prescribed today - not indicated at this time   Orders Placed This Encounter  Procedures  . Flu Vaccine QUAD 36+ mos IM  . Ambulatory referral to Allergy    Referral Priority:   Routine    Referral Type:   Allergy Testing    Referral Reason:   Specialty Services Required    Requested Specialty:   Allergy    Number of Visits Requested:   1    No orders of the defined types were placed in this encounter.    Everrett Coombe, MD,MS,  PGY3 04/15/2018 12:15  PM

## 2018-05-09 ENCOUNTER — Ambulatory Visit: Payer: Self-pay | Admitting: Allergy

## 2018-05-22 ENCOUNTER — Ambulatory Visit (INDEPENDENT_AMBULATORY_CARE_PROVIDER_SITE_OTHER): Payer: Medicaid Other | Admitting: Family Medicine

## 2018-05-22 ENCOUNTER — Other Ambulatory Visit: Payer: Self-pay

## 2018-05-22 VITALS — BP 125/80 | HR 94 | Temp 98.1°F | Wt 143.0 lb

## 2018-05-22 DIAGNOSIS — H6011 Cellulitis of right external ear: Secondary | ICD-10-CM

## 2018-05-22 DIAGNOSIS — J069 Acute upper respiratory infection, unspecified: Secondary | ICD-10-CM

## 2018-05-22 NOTE — Patient Instructions (Addendum)
Theraflu is fine, you can continue to take over the next few days.  Warm liquids and honey.   Take your piercing out, keep clean with warm water and soap. Use the ointment for the few days.

## 2018-05-22 NOTE — Progress Notes (Signed)
    Subjective:  Michelle [name] is a 27 y.o. female who presents to the [facility] today with a chief complaint of cough.   HPI:  Started getting sick on Thursday with cough, sore throat on Saturday. Congestion this week. Start coughing stuff up yesterday. No fever/chills. No nausea, vomiting, diarrhea. No sick contacts. Taking theraflu.  R cartilage ear piercing area has been red and painful. She noticed from drainage from the area last night. Not a new piercing site or new metal piercing  ROS: Per HPI   Objective:  Physical Exam: BP 125/80   Pulse 94   Temp 98.1 F (36.7 C) (Oral)   Wt 143 lb (64.9 kg)   LMP 08/14/2016 (Exact Date)   SpO2 99%   BMI 26.16 kg/m   Gen: NAD, resting comfortably HEENT: R ear external pinna with slight erythema and mildly TTP around piercing going through helix. TMs with good light reflex bilaterally. Oropharynx mildly erythematous. Nasal mucosa boggy.                                                                                                               CV: RRR with no murmurs appreciated Pulm: NWOB, CTAB with no crackles, wheezes, or rhonchi GI: Normal bowel sounds present. Soft, Nontender, Nondistended. MSK: no edema, cyanosis, or clubbing noted Skin: warm, dry Neuro: grossly normal, moves all extremities Psych: Normal affect and thought content  Assessment/Plan:  1. Viral URI Patient is nontoxic appearing with congestion and cough. Discussed supportive care with OTC cold medication, good hydration and honey. Given return precautions.  2. Cellulitis of right earlobe Mild cellulitis of helix without purulence. Discussed topical OTC antibiotic and good hygine of area after removal of piercing.   Bufford Lope, DO PGY-3, [facility] 05/22/2018 2:35 PM

## 2018-05-24 ENCOUNTER — Ambulatory Visit (HOSPITAL_COMMUNITY)
Admission: EM | Admit: 2018-05-24 | Discharge: 2018-05-24 | Disposition: A | Discharge status: Home / Self Care | Payer: Medicaid Other | Attending: Internal Medicine | Admitting: Internal Medicine

## 2018-05-24 ENCOUNTER — Encounter (HOSPITAL_COMMUNITY): Payer: Self-pay

## 2018-05-24 DIAGNOSIS — J069 Acute upper respiratory infection, unspecified: Secondary | ICD-10-CM | POA: Diagnosis not present

## 2018-05-24 DIAGNOSIS — F4310 Post-traumatic stress disorder, unspecified: Secondary | ICD-10-CM | POA: Diagnosis not present

## 2018-05-24 DIAGNOSIS — B9789 Other viral agents as the cause of diseases classified elsewhere: Secondary | ICD-10-CM

## 2018-05-24 DIAGNOSIS — Z79899 Other long term (current) drug therapy: Secondary | ICD-10-CM | POA: Diagnosis not present

## 2018-05-24 DIAGNOSIS — G43909 Migraine, unspecified, not intractable, without status migrainosus: Secondary | ICD-10-CM | POA: Insufficient documentation

## 2018-05-24 DIAGNOSIS — Z8249 Family history of ischemic heart disease and other diseases of the circulatory system: Secondary | ICD-10-CM | POA: Insufficient documentation

## 2018-05-24 DIAGNOSIS — Z9049 Acquired absence of other specified parts of digestive tract: Secondary | ICD-10-CM | POA: Insufficient documentation

## 2018-05-24 DIAGNOSIS — J45909 Unspecified asthma, uncomplicated: Secondary | ICD-10-CM | POA: Diagnosis not present

## 2018-05-24 DIAGNOSIS — J029 Acute pharyngitis, unspecified: Secondary | ICD-10-CM | POA: Diagnosis not present

## 2018-05-24 DIAGNOSIS — Z8632 Personal history of gestational diabetes: Secondary | ICD-10-CM | POA: Diagnosis not present

## 2018-05-24 DIAGNOSIS — Z87891 Personal history of nicotine dependence: Secondary | ICD-10-CM | POA: Diagnosis not present

## 2018-05-24 DIAGNOSIS — Z833 Family history of diabetes mellitus: Secondary | ICD-10-CM | POA: Insufficient documentation

## 2018-05-24 DIAGNOSIS — I10 Essential (primary) hypertension: Secondary | ICD-10-CM | POA: Diagnosis not present

## 2018-05-24 DIAGNOSIS — Z90710 Acquired absence of both cervix and uterus: Secondary | ICD-10-CM | POA: Diagnosis not present

## 2018-05-24 LAB — POCT RAPID STREP A: Streptococcus, Group A Screen (Direct): NEGATIVE

## 2018-05-24 MED ORDER — DEXAMETHASONE SODIUM PHOSPHATE 10 MG/ML IJ SOLN
INTRAMUSCULAR | Status: AC
  Filled 2018-05-24: qty 1

## 2018-05-24 MED ORDER — DEXAMETHASONE SODIUM PHOSPHATE 10 MG/ML IJ SOLN
10.0000 mg | Freq: Once | INTRAMUSCULAR | Status: AC
  Administered 2018-05-24: 10 mg via INTRAMUSCULAR

## 2018-05-24 MED ORDER — GUAIFENESIN ER 600 MG PO TB12: 600.0000 mg | ORAL_TABLET | Freq: Two times a day (BID) | ORAL | 0 refills | Status: DC

## 2018-05-24 MED ORDER — BENZONATATE 100 MG PO CAPS: 100.0000 mg | ORAL_CAPSULE | Freq: Three times a day (TID) | ORAL | 0 refills | Status: DC

## 2018-05-24 NOTE — Discharge Instructions (Addendum)
Rapid strep test was negative we will send for culture I believe this is a viral upper respiratory infection The symptoms of a viral infection can last anywhere from 7 to 10 days We will give you Tessalon Perles for cough Mucinex  for congestion.  Bacitracin is fine for the ear If you are not better by Sunday please follow-up Follow up as needed for continued or worsening symptoms

## 2018-05-24 NOTE — ED Triage Notes (Signed)
Pt presents with sore throat.

## 2018-05-26 ENCOUNTER — Encounter (HOSPITAL_COMMUNITY): Payer: Self-pay | Admitting: Family Medicine

## 2018-05-26 LAB — CULTURE, GROUP A STREP (THRC)

## 2018-05-26 NOTE — ED Provider Notes (Signed)
[facility]    CSN: [id number] Arrival date & time: 05/24/18  0909     History   Chief Complaint Chief Complaint  Patient presents with  . Sore Throat    HPI Michelle [name] is a 27 y.o. female.    URI  Presenting symptoms: congestion, cough, fatigue, rhinorrhea and sore throat   Severity:  Moderate Duration:  7 days Timing:  Constant Progression:  Waxing and waning Chronicity:  New Relieved by:  OTC medications Worsened by:  Nothing Associated symptoms: no arthralgias, no headaches, no myalgias, no neck pain, no sinus pain, no sneezing, no swollen glands and no wheezing   Risk factors: sick contacts   Risk factors: no recent illness and no recent travel     Past Medical History:  Diagnosis Date  . Anxiety   . Asthma   . Bipolar disorder ([place])    no current med.  . Depression    no current med.  . Family history of adverse reaction to anesthesia    states mother and sister are hard to wake up post-op  . Gestational diabetes   . History of seizure 06/20/2012   x 1 - during delivery of child(eclampsia)  . Hypertension   . Lipoma of lower back 08/2015  . Migraine   . PTSD (post-traumatic stress disorder)   . PTSD (post-traumatic stress disorder)   . Tachycardia   . TMJ (dislocation of temporomandibular joint)   . Vertigo     Patient Active Problem List   Diagnosis Date Noted  . Rash and nonspecific skin eruption 11/16/2017  . Easy bruising 11/16/2017  . Anxiety state 10/27/2016  . Migraine 02/10/2016  . PTSD 03/02/2010  . 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 ABDOMINAL HYSTERECTOMY    . ADENOIDECTOMY, TONSILLECTOMY AND MYRINGOTOMY WITH TUBE PLACEMENT  1990's  . CESAREAN SECTION  06/20/2012   Procedure: CESAREAN SECTION;  Surgeon: Emily Filbert, MD;  Location: [facility];  Service: Obstetrics;  Laterality: N/A;  . LAPAROSCOPIC APPENDECTOMY  07/25/2012   Procedure: APPENDECTOMY  LAPAROSCOPIC;  Surgeon: Zenovia Jarred, MD;  Location: [facility];  Service: General;  Laterality: N/A;  . LAPAROSCOPIC TUBAL LIGATION Bilateral 08/04/2014   Procedure: BILATERAL LAPAROSCOPIC TUBAL LIGATION;  Surgeon: Woodroe Mode, MD;  Location: [facility];  Service: Gynecology;  Laterality: Bilateral;  . LIPOMA EXCISION N/A 09/16/2015   Procedure: EXCISION LIPOMA LUMBAR REGION;  Surgeon: Donnie Mesa, MD;  Location: [facility];  Service: General;  Laterality: N/A;  . WISDOM TOOTH EXTRACTION      OB History    Gravida  3   Para  3   Term  2   Preterm  1   AB  0   Living  3     SAB  0   TAB  0   Ectopic  0   Multiple  0   Live Births  1        Obstetric Comments  C-Section Indication: Non-reassuring fetal tracing, growth delay & marked proteinuria & Pre-Eclampsia Eclamptic Seizure during C-section delivery         Home Medications    Prior to Admission medications   Medication Sig Start Date End Date Taking? Authorizing Provider  albuterol (PROAIR HFA) 108 (90 Base) MCG/ACT inhaler INHALE 2 PUFFS INTO THE LUNGS EVERY 4 HOURS AS NEEDED FOR WHEEZING OR SHORTNESS OF BREATH 04/01/18   Rory Percy,  DO  benzonatate (TESSALON) 100 MG capsule Take 1 capsule (100 mg total) by mouth every 8 (eight) hours. 05/24/18   Loura Halt A, NP  guaiFENesin (MUCINEX) 600 MG 12 hr tablet Take 1 tablet (600 mg total) by mouth 2 (two) times daily. 05/24/18   Loura Halt A, NP  ibuprofen (ADVIL,MOTRIN) 200 MG tablet Take 800 mg by mouth every 6 (six) hours as needed (pain).    [provider]  meclizine (ANTIVERT) 25 MG tablet Take 1 tablet (25 mg total) by mouth 3 (three) times daily as needed for dizziness. 03/05/18   Raylene Everts, MD  metoprolol succinate (TOPROL-XL) 25 MG 24 hr tablet TAKE 1 TABLET(25 MG) BY MOUTH DAILY 02/27/18   Evans Lance, MD  nortriptyline (PAMELOR) 25 MG capsule Take 3 capsules (75 mg total) by mouth at bedtime. 08/29/17   Cameron Sprang, MD  SUMATRIPTAN SUCCINATE IJ Inject 1 each into the muscle once as needed (migraine).    [provider]    Family History Family History  Problem Relation Age of Onset  . Breast cancer Paternal Grandmother   . Colon cancer Paternal Grandfather   . Colon cancer Maternal Grandfather   . Colon polyps Maternal Aunt   . Diabetes Mother   . Anesthesia problems Mother        hard to wake up post-op  . Diabetes Sister   . Anesthesia problems Sister        hard to wake up post-op  . Diabetes Father   . Heart disease Father     Social History Social History   Tobacco Use  . Smoking status: Former Smoker    Packs/day: 0.00    Years: 14.00    Pack years: 0.00    Types: Cigarettes    Last attempt to quit: 03/17/2017    Years since quitting: 1.1  . Smokeless tobacco: Never Used  Substance Use Topics  . Alcohol use: No  . Drug use: No     Allergies   Patient has no known allergies.   Review of Systems Review of Systems  Constitutional: Positive for fatigue.  HENT: Positive for congestion, rhinorrhea and sore throat. Negative for sinus pain and sneezing.   Respiratory: Positive for cough. Negative for wheezing.   Musculoskeletal: Negative for arthralgias, myalgias and neck pain.  Neurological: Negative for headaches.     Physical Exam Triage Vital Signs ED Triage Vitals  Enc Vitals Group     BP 05/24/18 0937 129/84     Pulse Rate 05/24/18 0937 (!) 104     Resp 05/24/18 0937 20     Temp 05/24/18 0937 (!) 97.3 F (36.3 C)     Temp Source 05/24/18 0937 Oral     SpO2 05/24/18 0937 98 %     Weight --      Height --      Head Circumference --      Peak Flow --      Pain Score 05/24/18 0938 8     Pain Loc --      Pain Edu? --      Excl. in [place]? --    No data found.  Updated Vital Signs BP 129/84 (BP Location: Left Arm)   Pulse (!) 104   Temp (!) 97.3 F (36.3 C) (Oral)   Resp 20   LMP 08/14/2016 (Exact Date)   SpO2 98%   Visual Acuity Right Eye  Distance:   Left Eye Distance:  Bilateral Distance:    Right Eye Near:   Left Eye Near:    Bilateral Near:     Physical Exam  Constitutional: She appears well-developed and well-nourished.  Very pleasant. Non toxic or ill appearing.   HENT:  Head: Normocephalic and atraumatic.  Right Ear: Hearing normal.  Left Ear: Hearing normal.  Mouth/Throat: Tonsils are 1+ on the right. Tonsils are 1+ on the left. No tonsillar exudate.  Bilateral TMs normal.  External ears normal.  Without posterior oropharyngeal erythema, tonsillar swelling or exudates. No lesions.  Clear drainage from nares.  No lymphadenopathy.   Neck: Normal range of motion.  Cardiovascular: Normal rate, regular rhythm and normal heart sounds.  Pulmonary/Chest: Effort normal and breath sounds normal.  Lungs clear in all fields. No dyspnea or distress. No retractions or nasal flaring.   Lymphadenopathy:    She has no cervical adenopathy.  Neurological: She is alert.  Skin: Skin is warm and dry. No rash noted. She is not diaphoretic. No erythema. No pallor.  Psychiatric: She has a normal mood and affect.  Nursing note and vitals reviewed.    UC Treatments / Results  Labs (all labs ordered are listed, but only abnormal results are displayed) Labs Reviewed  CULTURE, GROUP A STREP [facility])  POCT RAPID STREP A    EKG None  Radiology No results found.  Procedures Procedures (including critical care time)  Medications Ordered in UC Medications  dexamethasone (DECADRON) injection 10 mg (10 mg Intramuscular Given 05/24/18 1023)    Initial Impression / Assessment and Plan / UC Course  I have reviewed the triage vital signs and the nursing notes.  Pertinent labs & imaging results that were available during my care of the patient were reviewed by me and considered in my medical decision making (see chart for details).     Most likely viral illness Her son is sick with similar symptoms. tessalon pearls for  cough mucinex for congestion Steroid injection in clinic for throat inflammation and swelling.  Follow up as needed for continued or worsening symptoms Bacitracin for ear piercing.   Final Clinical Impressions(s) / UC Diagnoses   Final diagnoses:  Viral URI with cough     Discharge Instructions     Rapid strep test was negative we will send for culture I believe this is a viral upper respiratory infection The symptoms of a viral infection can last anywhere from 7 to 10 days We will give you Tessalon Perles for cough Mucinex  for congestion.  Bacitracin is fine for the ear If you are not better by Sunday please follow-up Follow up as needed for continued or worsening symptoms     ED Prescriptions    Medication Sig Dispense Auth. Provider   benzonatate (TESSALON) 100 MG capsule Take 1 capsule (100 mg total) by mouth every 8 (eight) hours. 21 capsule Kilea Mccarey A, NP   guaiFENesin (MUCINEX) 600 MG 12 hr tablet Take 1 tablet (600 mg total) by mouth 2 (two) times daily. 15 tablet Orvan July, NP     Controlled Substance Prescriptions [place] Controlled Substance Registry consulted? no   Orvan July, NP 05/26/18 1140

## 2018-05-27 ENCOUNTER — Encounter (HOSPITAL_COMMUNITY): Payer: Self-pay | Admitting: Emergency Medicine

## 2018-05-27 ENCOUNTER — Ambulatory Visit (HOSPITAL_COMMUNITY): Payer: Medicaid Other

## 2018-05-27 ENCOUNTER — Ambulatory Visit (HOSPITAL_COMMUNITY)
Admission: EM | Admit: 2018-05-27 | Discharge: 2018-05-27 | Discharge status: Against Medical Advice | Payer: Medicaid Other | Attending: Internal Medicine | Admitting: Internal Medicine

## 2018-05-27 ENCOUNTER — Other Ambulatory Visit: Payer: Self-pay

## 2018-05-27 NOTE — ED Notes (Signed)
Pt no answer x 3 

## 2018-05-27 NOTE — ED Triage Notes (Signed)
The patient presented to the [facility] with a complaint of pain to the great toe on her right foot that started last night. The patient reported that she dropped a heavy object that landed on her toe.

## 2018-05-27 NOTE — ED Notes (Signed)
No answer

## 2018-06-09 ENCOUNTER — Emergency Department (HOSPITAL_COMMUNITY)
Admission: EM | Admit: 2018-06-09 | Discharge: 2018-06-09 | Disposition: A | Discharge status: Home / Self Care | Payer: Medicaid Other | Attending: Emergency Medicine | Admitting: Emergency Medicine

## 2018-06-09 ENCOUNTER — Encounter (HOSPITAL_COMMUNITY): Payer: Self-pay

## 2018-06-09 ENCOUNTER — Emergency Department (HOSPITAL_COMMUNITY): Payer: Medicaid Other

## 2018-06-09 DIAGNOSIS — Y9389 Activity, other specified: Secondary | ICD-10-CM | POA: Diagnosis not present

## 2018-06-09 DIAGNOSIS — I10 Essential (primary) hypertension: Secondary | ICD-10-CM | POA: Insufficient documentation

## 2018-06-09 DIAGNOSIS — W228XXA Striking against or struck by other objects, initial encounter: Secondary | ICD-10-CM | POA: Diagnosis not present

## 2018-06-09 DIAGNOSIS — S99822A Other specified injuries of left foot, initial encounter: Secondary | ICD-10-CM | POA: Diagnosis present

## 2018-06-09 DIAGNOSIS — Z87891 Personal history of nicotine dependence: Secondary | ICD-10-CM | POA: Insufficient documentation

## 2018-06-09 DIAGNOSIS — S9032XA Contusion of left foot, initial encounter: Secondary | ICD-10-CM | POA: Diagnosis not present

## 2018-06-09 DIAGNOSIS — J45909 Unspecified asthma, uncomplicated: Secondary | ICD-10-CM | POA: Diagnosis not present

## 2018-06-09 DIAGNOSIS — Z79899 Other long term (current) drug therapy: Secondary | ICD-10-CM | POA: Diagnosis not present

## 2018-06-09 DIAGNOSIS — Y999 Unspecified external cause status: Secondary | ICD-10-CM | POA: Insufficient documentation

## 2018-06-09 DIAGNOSIS — S99922A Unspecified injury of left foot, initial encounter: Secondary | ICD-10-CM | POA: Diagnosis not present

## 2018-06-09 DIAGNOSIS — Y929 Unspecified place or not applicable: Secondary | ICD-10-CM | POA: Diagnosis not present

## 2018-06-09 DIAGNOSIS — M79672 Pain in left foot: Secondary | ICD-10-CM | POA: Diagnosis not present

## 2018-06-09 NOTE — ED Notes (Signed)
Patient transported to X-ray 

## 2018-06-09 NOTE — Discharge Instructions (Addendum)
Your x-rays do not show a fracture.  Continue take ibuprofen as needed for pain.  You can ice your foot as needed for the next day.  Try to keep it elevated when you are sitting or laying down.  Activity as tolerated by your level of discomfort.  I expect symptoms to progressively improve over the next several days.  Follow-up with sports medicine as needed if they persist beyond the end of the week.

## 2018-06-09 NOTE — ED Notes (Signed)
Pt stable, ambulatory, states understanding of discharge instructions 

## 2018-06-09 NOTE — ED Triage Notes (Signed)
Pt reports she dropped a plate on her left great toe last night

## 2018-06-09 NOTE — ED Provider Notes (Signed)
[facility] EMERGENCY DEPARTMENT Provider Note   CSN: [id number] Arrival date & time: 06/09/18  1408     History   Chief Complaint Chief Complaint  Patient presents with  . Toe Injury    HPI Michelle [name] is a 27 y.o. female.  HPI   27 year old female with left foot pain.  She dropped a plate onto her foot last night.  Persistent pain in the area of the distal first metatarsal since that time.  Worse with bearing weight.  No numbness or tingling.  She tried ibuprofen with only minimal improvement.  No other acute complaints.  Past Medical History:  Diagnosis Date  . Anxiety   . Asthma   . Bipolar disorder ([place])    no current med.  . Depression    no current med.  . Family history of adverse reaction to anesthesia    states mother and sister are hard to wake up post-op  . Gestational diabetes   . History of seizure 06/20/2012   x 1 - during delivery of child(eclampsia)  . Hypertension   . Lipoma of lower back 08/2015  . Migraine   . PTSD (post-traumatic stress disorder)   . PTSD (post-traumatic stress disorder)   . Tachycardia   . TMJ (dislocation of temporomandibular joint)   . Vertigo     Patient Active Problem List   Diagnosis Date Noted  . Rash and nonspecific skin eruption 11/16/2017  . Easy bruising 11/16/2017  . Anxiety state 10/27/2016  . Migraine 02/10/2016  . PTSD 03/02/2010  . 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 ABDOMINAL HYSTERECTOMY    . ADENOIDECTOMY, TONSILLECTOMY AND MYRINGOTOMY WITH TUBE PLACEMENT  1990's  . CESAREAN SECTION  06/20/2012   Procedure: CESAREAN SECTION;  Surgeon: Emily Filbert, MD;  Location: [facility];  Service: Obstetrics;  Laterality: N/A;  . LAPAROSCOPIC APPENDECTOMY  07/25/2012   Procedure: APPENDECTOMY LAPAROSCOPIC;  Surgeon: Zenovia Jarred, MD;  Location: [facility];  Service: General;  Laterality: N/A;  . LAPAROSCOPIC TUBAL LIGATION  Bilateral 08/04/2014   Procedure: BILATERAL LAPAROSCOPIC TUBAL LIGATION;  Surgeon: Woodroe Mode, MD;  Location: [facility];  Service: Gynecology;  Laterality: Bilateral;  . LIPOMA EXCISION N/A 09/16/2015   Procedure: EXCISION LIPOMA LUMBAR REGION;  Surgeon: Donnie Mesa, MD;  Location: [facility];  Service: General;  Laterality: N/A;  . WISDOM TOOTH EXTRACTION       OB History    Gravida  3   Para  3   Term  2   Preterm  1   AB  0   Living  3     SAB  0   TAB  0   Ectopic  0   Multiple  0   Live Births  1        Obstetric Comments  C-Section Indication: Non-reassuring fetal tracing, growth delay & marked proteinuria & Pre-Eclampsia Eclamptic Seizure during C-section delivery         Home Medications    Prior to Admission medications   Medication Sig Start Date End Date Taking? Authorizing Provider  benzonatate (TESSALON) 100 MG capsule Take 1 capsule (100 mg total) by mouth every 8 (eight) hours. 05/24/18   Loura Halt A, NP  guaiFENesin (MUCINEX) 600 MG 12 hr tablet Take 1 tablet (600 mg total) by mouth 2 (two) times daily. 05/24/18   Orvan July, NP  ibuprofen (  ADVIL,MOTRIN) 200 MG tablet Take 800 mg by mouth every 6 (six) hours as needed (pain).    [provider]    Family History Family History  Problem Relation Age of Onset  . Breast cancer Paternal Grandmother   . Colon cancer Paternal Grandfather   . Colon cancer Maternal Grandfather   . Colon polyps Maternal Aunt   . Diabetes Mother   . Anesthesia problems Mother        hard to wake up post-op  . Diabetes Sister   . Anesthesia problems Sister        hard to wake up post-op  . Diabetes Father   . Heart disease Father     Social History Social History   Tobacco Use  . Smoking status: Former Smoker    Packs/day: 0.00    Years: 14.00    Pack years: 0.00    Types: Cigarettes    Last attempt to quit: 03/17/2017    Years since quitting: 1.2  . Smokeless tobacco: Never  Used  Substance Use Topics  . Alcohol use: No  . Drug use: No     Allergies   Patient has no known allergies.   Review of Systems Review of Systems  All systems reviewed and negative, other than as noted in HPI.  Physical Exam Updated Vital Signs BP 116/78 (BP Location: Right Arm)   Pulse 89   Temp 98.5 F (36.9 C) (Oral)   Resp 16   Ht 5\' 2"  (1.575 m)   Wt 64.8 kg   LMP 08/14/2016 (Exact Date)   SpO2 100%   BMI 26.13 kg/m   Physical Exam  Constitutional: She appears well-developed and well-nourished. No distress.  HENT:  Head: Normocephalic and atraumatic.  Eyes: Conjunctivae are normal. Right eye exhibits no discharge. Left eye exhibits no discharge.  Neck: Neck supple.  Cardiovascular: Normal rate, regular rhythm and normal heart sounds. Exam reveals no gallop and no friction rub.  No murmur heard. Pulmonary/Chest: Effort normal and breath sounds normal. No respiratory distress.  Abdominal: Soft. She exhibits no distension. There is no tenderness.  Musculoskeletal: She exhibits tenderness. She exhibits no edema.  Mild tenderness to palpation along the mid to distal left first metatarsal.  Some mild ecchymosis.  She can fully range at the first MP joint without apparent difficulty.  She is neurovascular intact distally.  Neurological: She is alert.  Skin: Skin is warm and dry.  Psychiatric: She has a normal mood and affect. Her behavior is normal. Thought content normal.  Nursing note and vitals reviewed.    ED Treatments / Results  Labs (all labs ordered are listed, but only abnormal results are displayed) Labs Reviewed - No data to display  EKG None  Radiology No results found.   Dg Foot 2 Views Left  Result Date: 06/09/2018 CLINICAL DATA:  Dropped a plate onto foot, pain at distal first metatarsal EXAM: LEFT FOOT - 2 VIEW COMPARISON:  05/01/2014 FINDINGS: Osseous mineralization normal. Joint spaces preserved. No acute fracture, dislocation, or bone  destruction. IMPRESSION: No acute osseous abnormalities. Electronically Signed   By: Lavonia Dana M.D.   On: 06/09/2018 15:48    Procedures Procedures (including critical care time)  Medications Ordered in ED Medications - No data to display   Initial Impression / Assessment and Plan / ED Course  I have reviewed the triage vital signs and the nursing notes.  Pertinent labs & imaging results that were available during my care of the patient  were reviewed by me and considered in my medical decision making (see chart for details).     Likely foot contusion.  Will image to rule out fracture.  Continued PRN NSAIDs/RICE therapy if negative with activity as tolerated.   Final Clinical Impressions(s) / ED Diagnoses   Final diagnoses:  Contusion of left foot, initial encounter    ED Discharge Orders    None       Virgel Manifold, MD 06/17/18 1649

## 2018-07-23 ENCOUNTER — Telehealth: Payer: Self-pay | Admitting: Neurology

## 2018-07-23 ENCOUNTER — Other Ambulatory Visit: Payer: Self-pay

## 2018-07-23 ENCOUNTER — Encounter (HOSPITAL_COMMUNITY): Payer: Self-pay | Admitting: Emergency Medicine

## 2018-07-23 ENCOUNTER — Ambulatory Visit (HOSPITAL_COMMUNITY)
Admission: EM | Admit: 2018-07-23 | Discharge: 2018-07-23 | Disposition: A | Discharge status: Home / Self Care | Payer: Medicaid Other | Attending: Family Medicine | Admitting: Family Medicine

## 2018-07-23 DIAGNOSIS — G43919 Migraine, unspecified, intractable, without status migrainosus: Secondary | ICD-10-CM | POA: Diagnosis not present

## 2018-07-23 MED ORDER — KETOROLAC TROMETHAMINE 60 MG/2ML IM SOLN
INTRAMUSCULAR | Status: AC
  Filled 2018-07-23: qty 2

## 2018-07-23 MED ORDER — DEXAMETHASONE SODIUM PHOSPHATE 10 MG/ML IJ SOLN
INTRAMUSCULAR | Status: AC
  Filled 2018-07-23: qty 1

## 2018-07-23 MED ORDER — KETOROLAC TROMETHAMINE 60 MG/2ML IM SOLN
60.0000 mg | Freq: Once | INTRAMUSCULAR | Status: AC
  Administered 2018-07-23: 60 mg via INTRAMUSCULAR

## 2018-07-23 MED ORDER — AMITRIPTYLINE HCL 75 MG PO TABS: 75.0000 mg | ORAL_TABLET | Freq: Every evening | ORAL | 0 refills | Status: DC | PRN

## 2018-07-23 MED ORDER — DEXAMETHASONE SODIUM PHOSPHATE 10 MG/ML IJ SOLN
10.0000 mg | Freq: Once | INTRAMUSCULAR | Status: AC
  Administered 2018-07-23: 10 mg via INTRAMUSCULAR

## 2018-07-23 NOTE — Discharge Instructions (Addendum)
I have prescribed amitriptyline 75 mg, 1 at nighttime.  I gave you a 30-day supply until you can get a refill from your usual neurologist

## 2018-07-23 NOTE — ED Triage Notes (Signed)
PT has had a migraine since Saturday. PT has a history of same. PT stopped taking her daily migraine meds because her migraines nearly stopped.

## 2018-07-23 NOTE — Telephone Encounter (Signed)
error 

## 2018-07-23 NOTE — ED Provider Notes (Signed)
[facility]    CSN: [id number] Arrival date & time: 07/23/18  1621     History   Chief Complaint Chief Complaint  Patient presents with  . Migraine    HPI Michelle [name] is a 28 y.o. female.   HPI  Patient has longstanding migraines.  She sees neurology.  She previously took amitriptyline for prevention.  Because she had been headache free for some time she stopped amitriptyline.  She states she is now here with a typical migraine.  Light sensitivity.  Nausea.  Severe head pain.  She is had no head injury.  She has had no sinus congestion or infection.  No fall or injury.  No neurologic symptoms.  She is here requesting an injection.  She will follow-up with her neurologist.  She called their office today but they were unable to see her on short notice.  Past Medical History:  Diagnosis Date  . Anxiety   . Asthma   . Bipolar disorder ([place])    no current med.  . Depression    no current med.  . Family history of adverse reaction to anesthesia    states mother and sister are hard to wake up post-op  . Gestational diabetes   . History of seizure 06/20/2012   x 1 - during delivery of child(eclampsia)  . Hypertension   . Lipoma of lower back 08/2015  . Migraine   . PTSD (post-traumatic stress disorder)   . PTSD (post-traumatic stress disorder)   . Tachycardia   . TMJ (dislocation of temporomandibular joint)   . Vertigo     Patient Active Problem List   Diagnosis Date Noted  . Rash and nonspecific skin eruption 11/16/2017  . Easy bruising 11/16/2017  . Anxiety state 10/27/2016  . Migraine 02/10/2016  . PTSD 03/02/2010  . 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 ABDOMINAL HYSTERECTOMY    . ADENOIDECTOMY, TONSILLECTOMY AND MYRINGOTOMY WITH TUBE PLACEMENT  1990's  . CESAREAN SECTION  06/20/2012   Procedure: CESAREAN SECTION;  Surgeon: Emily Filbert, MD;  Location: [facility];  Service: Obstetrics;   Laterality: N/A;  . LAPAROSCOPIC APPENDECTOMY  07/25/2012   Procedure: APPENDECTOMY LAPAROSCOPIC;  Surgeon: Zenovia Jarred, MD;  Location: [facility];  Service: General;  Laterality: N/A;  . LAPAROSCOPIC TUBAL LIGATION Bilateral 08/04/2014   Procedure: BILATERAL LAPAROSCOPIC TUBAL LIGATION;  Surgeon: Woodroe Mode, MD;  Location: [facility];  Service: Gynecology;  Laterality: Bilateral;  . LIPOMA EXCISION N/A 09/16/2015   Procedure: EXCISION LIPOMA LUMBAR REGION;  Surgeon: Donnie Mesa, MD;  Location: [facility];  Service: General;  Laterality: N/A;  . WISDOM TOOTH EXTRACTION      OB History    Gravida  3   Para  3   Term  2   Preterm  1   AB  0   Living  3     SAB  0   TAB  0   Ectopic  0   Multiple  0   Live Births  1        Obstetric Comments  C-Section Indication: Non-reassuring fetal tracing, growth delay & marked proteinuria & Pre-Eclampsia Eclamptic Seizure during C-section delivery         Home Medications    Prior to Admission medications   Medication Sig Start Date End Date Taking? Authorizing Provider  amitriptyline (ELAVIL) 75 MG tablet Take 1 tablet (75  mg total) by mouth at bedtime as needed for sleep. 07/23/18   Raylene Everts, MD  benzonatate (TESSALON) 100 MG capsule Take 1 capsule (100 mg total) by mouth every 8 (eight) hours. 05/24/18   Loura Halt A, NP  guaiFENesin (MUCINEX) 600 MG 12 hr tablet Take 1 tablet (600 mg total) by mouth 2 (two) times daily. 05/24/18   Loura Halt A, NP  ibuprofen (ADVIL,MOTRIN) 200 MG tablet Take 800 mg by mouth every 6 (six) hours as needed (pain).    [provider]    Family History Family History  Problem Relation Age of Onset  . Breast cancer Paternal Grandmother   . Colon cancer Paternal Grandfather   . Colon cancer Maternal Grandfather   . Colon polyps Maternal Aunt   . Diabetes Mother   . Anesthesia problems Mother        hard to wake up post-op  . Diabetes Sister   . Anesthesia  problems Sister        hard to wake up post-op  . Diabetes Father   . Heart disease Father     Social History Social History   Tobacco Use  . Smoking status: Former Smoker    Packs/day: 0.00    Years: 14.00    Pack years: 0.00    Types: Cigarettes    Last attempt to quit: 03/17/2017    Years since quitting: 1.3  . Smokeless tobacco: Never Used  Substance Use Topics  . Alcohol use: No  . Drug use: No     Allergies   Patient has no known allergies.   Review of Systems Review of Systems  Constitutional: Negative for chills and fever.  HENT: Negative for ear pain and sore throat.   Eyes: Positive for photophobia. Negative for pain and visual disturbance.  Respiratory: Negative for cough and shortness of breath.   Cardiovascular: Negative for chest pain and palpitations.  Gastrointestinal: Positive for nausea. Negative for abdominal pain and vomiting.  Genitourinary: Negative for dysuria and hematuria.  Musculoskeletal: Negative for arthralgias and back pain.  Skin: Negative for color change and rash.  Neurological: Positive for headaches. Negative for seizures and syncope.  All other systems reviewed and are negative.    Physical Exam Triage Vital Signs ED Triage Vitals  Enc Vitals Group     BP 07/23/18 1651 111/76     Pulse Rate 07/23/18 1651 (!) 112     Resp 07/23/18 1651 16     Temp 07/23/18 1651 99.3 F (37.4 C)     Temp Source 07/23/18 1651 Oral     SpO2 07/23/18 1651 96 %     Weight --      Height --      Head Circumference --      Peak Flow --      Pain Score 07/23/18 1650 8     Pain Loc --      Pain Edu? --      Excl. in [place]? --    No data found.  Updated Vital Signs BP 111/76   Pulse (!) 112   Temp 99.3 F (37.4 C) (Oral)   Resp 16   LMP 08/14/2016 (Exact Date)   SpO2 96%   Visual Acuity Right Eye Distance:   Left Eye Distance:   Bilateral Distance:    Right Eye Near:   Left Eye Near:    Bilateral Near:     Physical  Exam Constitutional:      General: She  is in acute distress.     Appearance: Normal appearance. She is well-developed.     Comments: Patient very uncomfortable, darkened room  HENT:     Head: Normocephalic and atraumatic.     Right Ear: Tympanic membrane, ear canal and external ear normal.     Left Ear: Tympanic membrane, ear canal and external ear normal.     Nose: Nose normal.     Mouth/Throat:     Mouth: Mucous membranes are moist.  Eyes:     Extraocular Movements: Extraocular movements intact.     Conjunctiva/sclera: Conjunctivae normal.     Pupils: Pupils are equal, round, and reactive to light.  Neck:     Musculoskeletal: Normal range of motion and neck supple.  Cardiovascular:     Rate and Rhythm: Normal rate and regular rhythm.  Pulmonary:     Effort: Pulmonary effort is normal. No respiratory distress.     Breath sounds: Normal breath sounds.  Abdominal:     General: Abdomen is flat. There is no distension.     Palpations: Abdomen is soft.  Musculoskeletal: Normal range of motion.  Skin:    General: Skin is warm and dry.  Neurological:     General: No focal deficit present.     Mental Status: She is alert and oriented to person, place, and time.  Psychiatric:        Mood and Affect: Mood normal.        Thought Content: Thought content normal.   Patient is here with her husband.  UC Treatments / Results  Labs (all labs ordered are listed, but only abnormal results are displayed) Labs Reviewed - No data to display  EKG None  Radiology No results found.  Procedures Procedures (including critical care time)  Medications Ordered in UC Medications  ketorolac (TORADOL) injection 60 mg (60 mg Intramuscular Given 07/23/18 1746)  dexamethasone (DECADRON) injection 10 mg (10 mg Intramuscular Given 07/23/18 1745)    Initial Impression / Assessment and Plan / UC Course  I have reviewed the triage vital signs and the nursing notes.  Pertinent labs & imaging results  that were available during my care of the patient were reviewed by me and considered in my medical decision making (see chart for details).     Patient has chronic migraines.  She knows the treatment, plan, and when to return. Final Clinical Impressions(s) / UC Diagnoses   Final diagnoses:  Intractable migraine without status migrainosus, unspecified migraine type     Discharge Instructions     I have prescribed amitriptyline 75 mg, 1 at nighttime.  I gave you a 30-day supply until you can get a refill from your usual neurologist    ED Prescriptions    Medication Sig Dispense Auth. Provider   amitriptyline (ELAVIL) 75 MG tablet Take 1 tablet (75 mg total) by mouth at bedtime as needed for sleep. 30 tablet Raylene Everts, MD     Controlled Substance Prescriptions [place] Controlled Substance Registry consulted? Not Applicable   Raylene Everts, MD 07/23/18 2057

## 2018-09-03 IMAGING — DX DG CHEST 2V
2 series · 2 of 2 positions shown · non-contrast
Comparison: Chest radiograph 05/03/2016.

CLINICAL DATA: Evaluate for pneumonia.  Shortness of breath.

EXAM:
CHEST  2 VIEW

[chest pa]
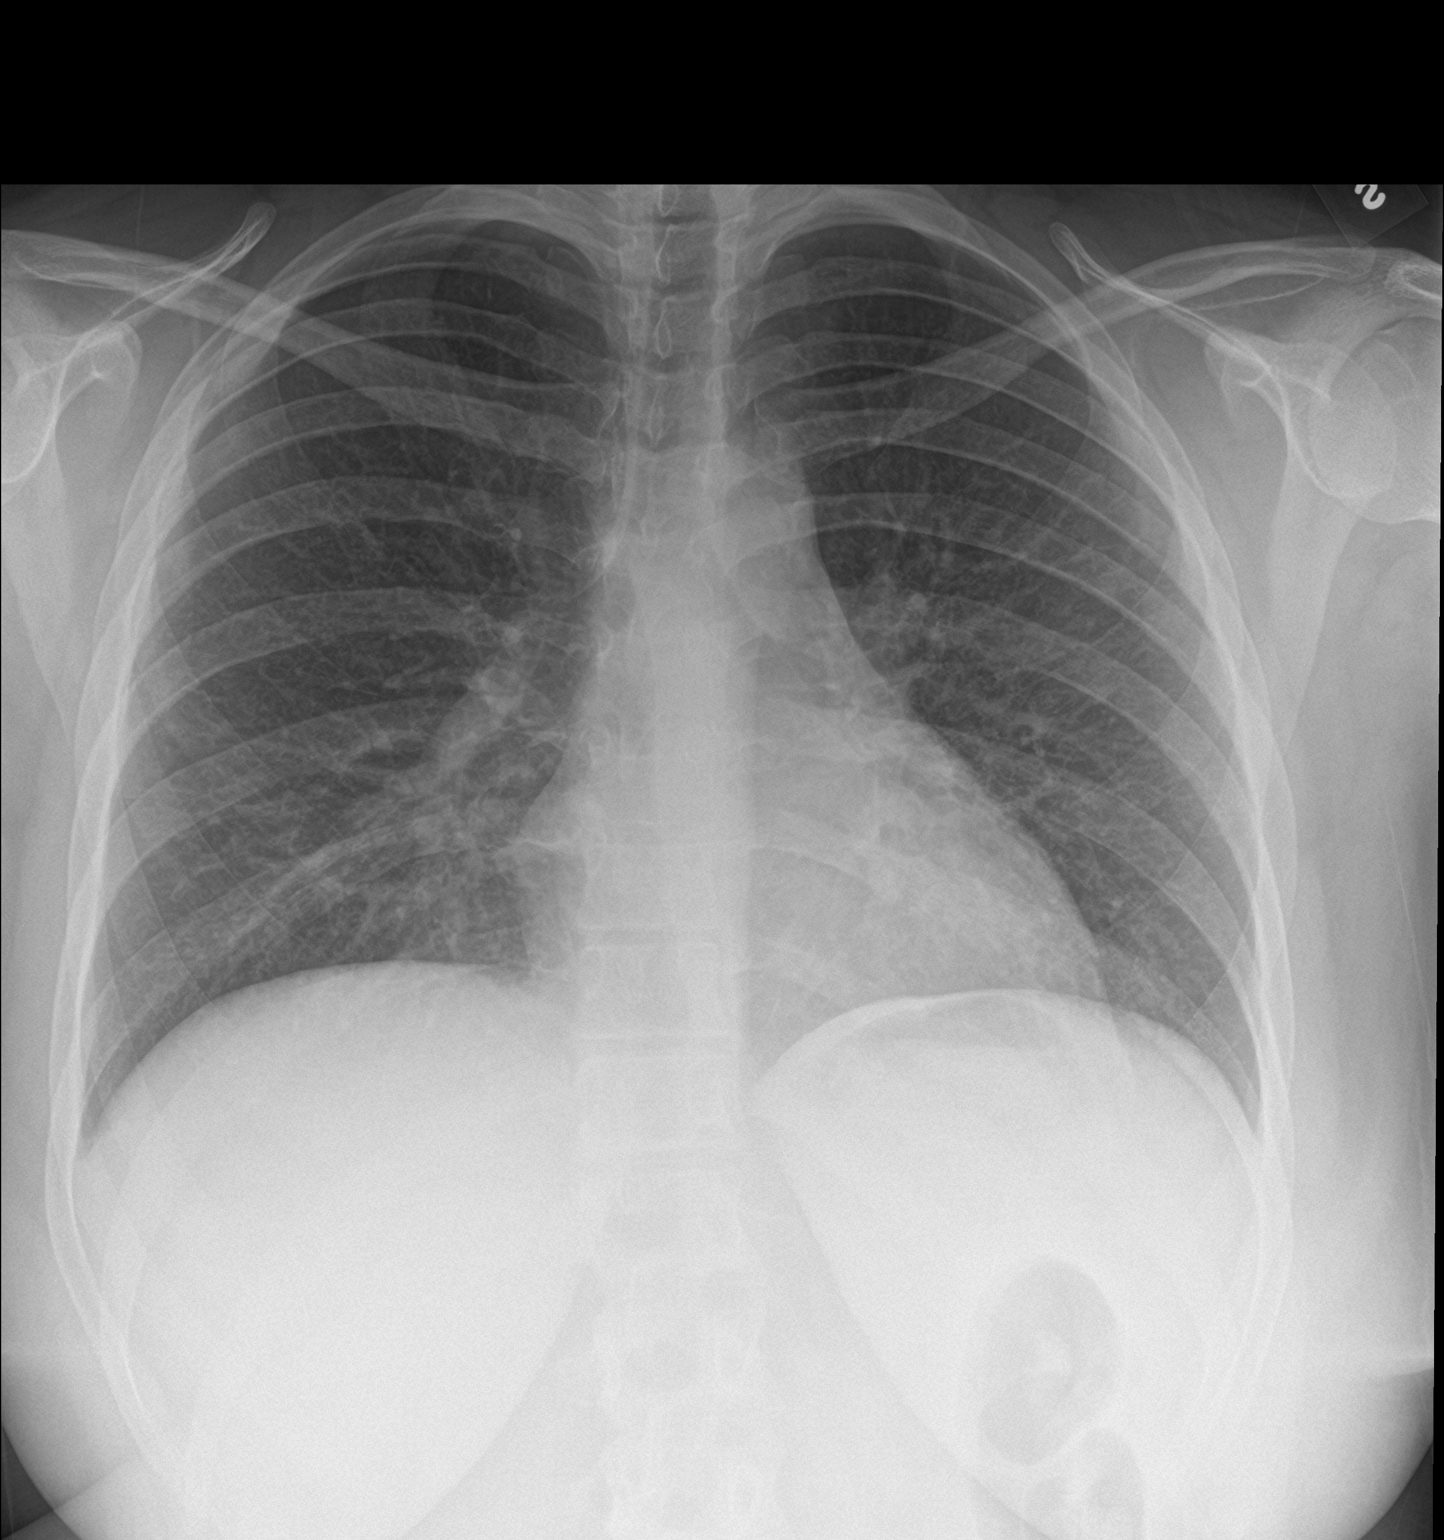

[chest lat]
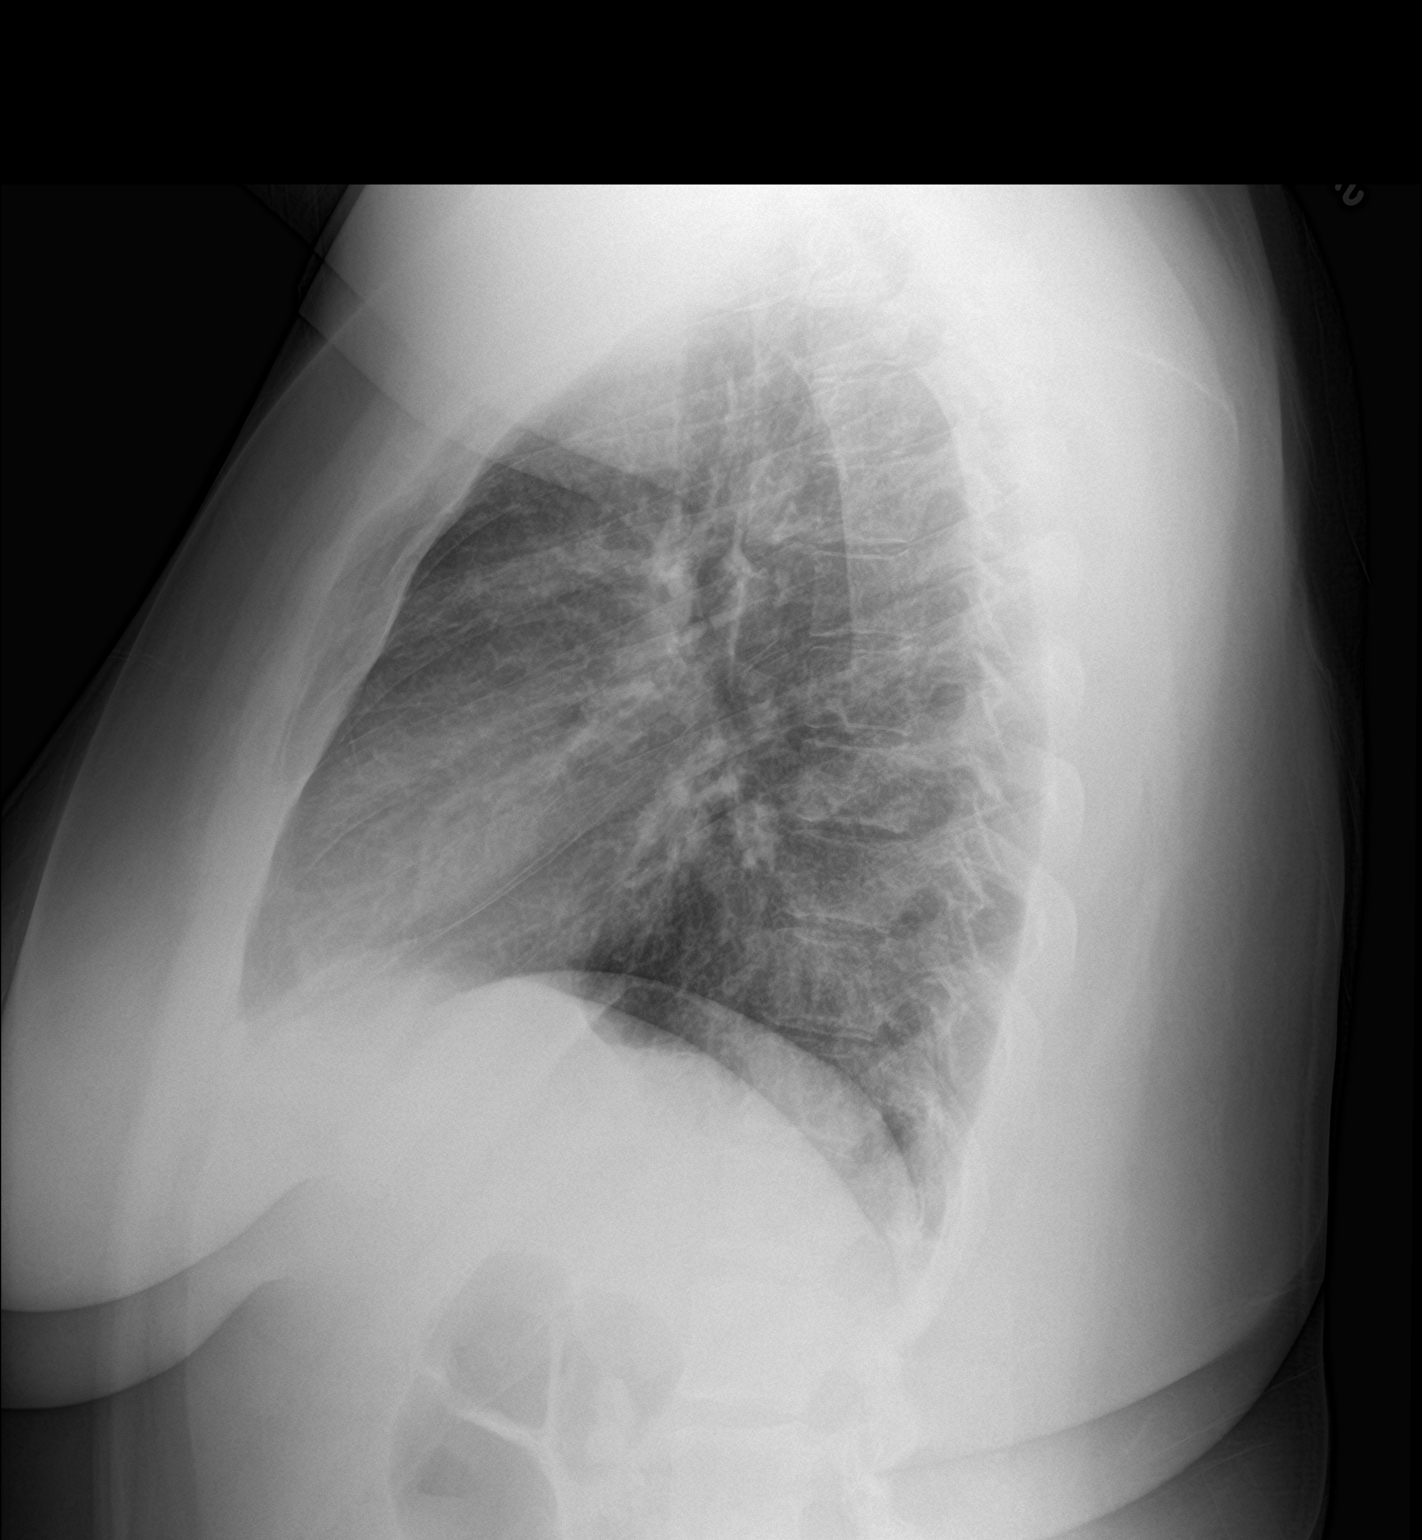

[2 of 2 positions shown; findings below may reference images not displayed]

FINDINGS: The heart size and mediastinal contours are within normal limits.
Both lungs are clear. The visualized skeletal structures are
unremarkable.
IMPRESSION: No active cardiopulmonary disease.

## 2018-09-17 ENCOUNTER — Telehealth: Payer: Self-pay | Admitting: *Deleted

## 2018-09-17 DIAGNOSIS — Z20828 Contact with and (suspected) exposure to other viral communicable diseases: Secondary | ICD-10-CM

## 2018-09-17 MED ORDER — OSELTAMIVIR PHOSPHATE 75 MG PO CAPS: 75.0000 mg | ORAL_CAPSULE | Freq: Every day | ORAL | 0 refills | Status: AC

## 2018-09-17 NOTE — Telephone Encounter (Signed)
Patient did receive the Influenza vaccination for this year

## 2018-09-17 NOTE — Telephone Encounter (Signed)
Household exposure to Influenza A (+) assay case  A/ Exposure to Influenza A P/ Prophylaxis with Tamiflu 75 mg cap, one caps daily x 14 days

## 2018-09-17 NOTE — Telephone Encounter (Signed)
Spoke with patient about recommendations of prophylactic tamiflu in people with flu vaccine.  She will hold off for now on getting the meds and was hands frequently. Fleeger, Salome Spotted, CMA

## 2018-09-17 NOTE — Telephone Encounter (Signed)
Pt sister was Dx with flu A at an urgent care yesterday.  Mom is requesting a script of tamiflu for pt.  Spoke with Dr. Wendy Poet, he is willing to write.   Will forward message to him. Finnis Colee, Salome Spotted, CMA

## 2018-09-18 ENCOUNTER — Encounter (HOSPITAL_COMMUNITY): Payer: Self-pay

## 2018-09-18 ENCOUNTER — Ambulatory Visit (HOSPITAL_COMMUNITY)
Admission: EM | Admit: 2018-09-18 | Discharge: 2018-09-18 | Disposition: A | Discharge status: Home / Self Care | Payer: Medicaid Other | Attending: Family Medicine | Admitting: Family Medicine

## 2018-09-18 DIAGNOSIS — G43809 Other migraine, not intractable, without status migrainosus: Secondary | ICD-10-CM

## 2018-09-18 MED ORDER — DEXAMETHASONE SODIUM PHOSPHATE 10 MG/ML IJ SOLN
10.0000 mg | Freq: Once | INTRAMUSCULAR | Status: AC
  Administered 2018-09-18: 10 mg via INTRAMUSCULAR

## 2018-09-18 MED ORDER — METOCLOPRAMIDE HCL 5 MG/ML IJ SOLN
INTRAMUSCULAR | Status: AC
  Filled 2018-09-18: qty 2

## 2018-09-18 MED ORDER — METOCLOPRAMIDE HCL 5 MG/ML IJ SOLN
5.0000 mg | Freq: Once | INTRAMUSCULAR | Status: AC
  Administered 2018-09-18: 5 mg via INTRAMUSCULAR

## 2018-09-18 MED ORDER — KETOROLAC TROMETHAMINE 60 MG/2ML IM SOLN
60.0000 mg | Freq: Once | INTRAMUSCULAR | Status: AC
  Administered 2018-09-18: 60 mg via INTRAMUSCULAR

## 2018-09-18 MED ORDER — KETOROLAC TROMETHAMINE 60 MG/2ML IM SOLN
INTRAMUSCULAR | Status: AC
  Filled 2018-09-18: qty 2

## 2018-09-18 MED ORDER — DEXAMETHASONE SODIUM PHOSPHATE 10 MG/ML IJ SOLN
INTRAMUSCULAR | Status: AC
  Filled 2018-09-18: qty 1

## 2018-09-18 NOTE — ED Provider Notes (Signed)
[facility]    CSN: [id number] Arrival date & time: 09/18/18  0803     History   Chief Complaint Chief Complaint  Patient presents with  . Headache  . Generalized Body Aches  . Otalgia    Right    HPI Michelle [name] is a 28 y.o. female.   Patient is a 28 year old female with past medical history of migraines.  She presents today with migraine that started this morning.  She usually sees her neurologist for treatment of migraines.  Pain is usually relieved with migraine cocktail.  Describes the headache as throbbing aching more on the right side with right ear pain.  She has some mild photophobia.  Denies any nausea, vomiting, dizziness.  She denies any associated cough, congestion, fevers, nasal congestion or rhinorrhea.  She has not taken  anything for symptoms.  Rates her pain 6 out of 10.  ROS per HPI    Headache  Associated symptoms: ear pain   Otalgia  Associated symptoms: headaches     Past Medical History:  Diagnosis Date  . Anxiety   . Asthma   . Bipolar disorder ([place])    no current med.  . Depression    no current med.  . Family history of adverse reaction to anesthesia    states mother and sister are hard to wake up post-op  . Gestational diabetes   . History of seizure 06/20/2012   x 1 - during delivery of child(eclampsia)  . Hypertension   . Lipoma of lower back 08/2015  . Migraine   . PTSD (post-traumatic stress disorder)   . PTSD (post-traumatic stress disorder)   . Tachycardia   . TMJ (dislocation of temporomandibular joint)   . Vertigo     Patient Active Problem List   Diagnosis Date Noted  . Rash and nonspecific skin eruption 11/16/2017  . Easy bruising 11/16/2017  . Anxiety state 10/27/2016  . Migraine 02/10/2016  . PTSD 03/02/2010  . 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 ABDOMINAL HYSTERECTOMY    . ADENOIDECTOMY, TONSILLECTOMY AND MYRINGOTOMY WITH TUBE  PLACEMENT  1990's  . CESAREAN SECTION  06/20/2012   Procedure: CESAREAN SECTION;  Surgeon: Emily Filbert, MD;  Location: [facility];  Service: Obstetrics;  Laterality: N/A;  . LAPAROSCOPIC APPENDECTOMY  07/25/2012   Procedure: APPENDECTOMY LAPAROSCOPIC;  Surgeon: Zenovia Jarred, MD;  Location: [facility];  Service: General;  Laterality: N/A;  . LAPAROSCOPIC TUBAL LIGATION Bilateral 08/04/2014   Procedure: BILATERAL LAPAROSCOPIC TUBAL LIGATION;  Surgeon: Woodroe Mode, MD;  Location: [facility];  Service: Gynecology;  Laterality: Bilateral;  . LIPOMA EXCISION N/A 09/16/2015   Procedure: EXCISION LIPOMA LUMBAR REGION;  Surgeon: Donnie Mesa, MD;  Location: [facility];  Service: General;  Laterality: N/A;  . WISDOM TOOTH EXTRACTION      OB History    Gravida  3   Para  3   Term  2   Preterm  1   AB  0   Living  3     SAB  0   TAB  0   Ectopic  0   Multiple  0   Live Births  1        Obstetric Comments  C-Section Indication: Non-reassuring fetal tracing, growth delay & marked proteinuria & Pre-Eclampsia Eclamptic Seizure during C-section delivery         Home Medications  Prior to Admission medications   Medication Sig Start Date End Date Taking? Authorizing Provider  amitriptyline (ELAVIL) 75 MG tablet Take 1 tablet (75 mg total) by mouth at bedtime as needed for sleep. 07/23/18   Raylene Everts, MD  guaiFENesin (MUCINEX) 600 MG 12 hr tablet Take 1 tablet (600 mg total) by mouth 2 (two) times daily. 05/24/18   Loura Halt A, NP  ibuprofen (ADVIL,MOTRIN) 200 MG tablet Take 800 mg by mouth every 6 (six) hours as needed (pain).    [provider]  oseltamivir (TAMIFLU) 75 MG capsule Take 1 capsule (75 mg total) by mouth daily for 14 days. 09/17/18 10/01/18  McDiarmid, Blane Ohara, MD    Family History Family History  Problem Relation Age of Onset  . Breast cancer Paternal Grandmother   . Colon cancer Paternal Grandfather   . Colon cancer Maternal Grandfather     . Colon polyps Maternal Aunt   . Diabetes Mother   . Anesthesia problems Mother        hard to wake up post-op  . Diabetes Sister   . Anesthesia problems Sister        hard to wake up post-op  . Diabetes Father   . Heart disease Father     Social History Social History   Tobacco Use  . Smoking status: Former Smoker    Packs/day: 0.00    Years: 14.00    Pack years: 0.00    Types: Cigarettes    Last attempt to quit: 03/17/2017    Years since quitting: 1.5  . Smokeless tobacco: Never Used  Substance Use Topics  . Alcohol use: No  . Drug use: No     Allergies   Patient has no known allergies.   Review of Systems Review of Systems  HENT: Positive for ear pain.   Neurological: Positive for headaches.     Physical Exam Triage Vital Signs ED Triage Vitals  Enc Vitals Group     BP 09/18/18 0820 114/76     Pulse Rate 09/18/18 0820 73     Resp 09/18/18 0820 18     Temp 09/18/18 0820 97.8 F (36.6 C)     Temp Source 09/18/18 0820 Oral     SpO2 09/18/18 0820 100 %     Weight --      Height --      Head Circumference --      Peak Flow --      Pain Score 09/18/18 0840 6     Pain Loc --      Pain Edu? --      Excl. in [place]? --    No data found.  Updated Vital Signs BP 114/76 (BP Location: Left Arm)   Pulse 73   Temp 97.8 F (36.6 C) (Oral)   Resp 18   LMP 08/14/2016 (Exact Date)   SpO2 100%   Visual Acuity Right Eye Distance:   Left Eye Distance:   Bilateral Distance:    Right Eye Near:   Left Eye Near:    Bilateral Near:     Physical Exam Vitals signs and nursing note reviewed.  Constitutional:      General: She is not in acute distress.    Appearance: She is well-developed and normal weight. She is not ill-appearing, toxic-appearing or diaphoretic.  HENT:     Head: Normocephalic and atraumatic.     Right Ear: Tympanic membrane and ear canal normal.     Left Ear: Tympanic  membrane and ear canal normal.     Nose: Nose normal.     Mouth/Throat:      Pharynx: Oropharynx is clear.  Eyes:     Conjunctiva/sclera: Conjunctivae normal.  Neck:     Musculoskeletal: Normal range of motion.  Cardiovascular:     Rate and Rhythm: Normal rate and regular rhythm.     Heart sounds: Normal heart sounds.  Pulmonary:     Effort: Pulmonary effort is normal.     Breath sounds: Normal breath sounds.  Musculoskeletal: Normal range of motion.  Lymphadenopathy:     Cervical: No cervical adenopathy.  Skin:    General: Skin is warm and dry.  Neurological:     Mental Status: She is alert.     Cranial Nerves: No dysarthria or facial asymmetry.  Psychiatric:        Mood and Affect: Mood normal.        Speech: Speech normal.        Behavior: Behavior normal.      UC Treatments / Results  Labs (all labs ordered are listed, but only abnormal results are displayed) Labs Reviewed - No data to display  EKG None  Radiology No results found.  Procedures Procedures (including critical care time)  Medications Ordered in UC Medications  ketorolac (TORADOL) injection 60 mg (60 mg Intramuscular Given 09/18/18 0927)  metoCLOPramide (REGLAN) injection 5 mg (5 mg Intramuscular Given 09/18/18 0927)  dexamethasone (DECADRON) injection 10 mg (10 mg Intramuscular Given 09/18/18 0927)    Initial Impression / Assessment and Plan / UC Course  I have reviewed the triage vital signs and the nursing notes.  Pertinent labs & imaging results that were available during my care of the patient were reviewed by me and considered in my medical decision making (see chart for details).     Migraine Migraine cocktail given in clinic We will have her follow-up with her neurologist for continued or  worsening symptoms Final Clinical Impressions(s) / UC Diagnoses   Final diagnoses:  Other migraine without status migrainosus, not intractable     Discharge Instructions     We treated you for a migraine today get some rest Stay hydrated Follow up as needed for  continued or worsening symptoms\     ED Prescriptions    None     Controlled Substance Prescriptions  Controlled Substance Registry consulted? Not Applicable   Orvan July, NP 09/18/18 [phone]

## 2018-09-18 NOTE — ED Triage Notes (Signed)
Pt presents with headache, general body aches, and right ear pain.

## 2018-09-18 NOTE — Discharge Instructions (Addendum)
We treated you for a migraine today get some rest Stay hydrated Follow up as needed for continued or worsening symptoms\

## 2018-09-26 DIAGNOSIS — L03019 Cellulitis of unspecified finger: Secondary | ICD-10-CM | POA: Diagnosis not present

## 2018-09-26 DIAGNOSIS — Z6826 Body mass index (BMI) 26.0-26.9, adult: Secondary | ICD-10-CM | POA: Diagnosis not present

## 2018-09-26 DIAGNOSIS — M26601 Right temporomandibular joint disorder, unspecified: Secondary | ICD-10-CM | POA: Diagnosis not present

## 2018-09-27 DIAGNOSIS — L700 Acne vulgaris: Secondary | ICD-10-CM | POA: Diagnosis not present

## 2018-10-04 ENCOUNTER — Other Ambulatory Visit: Payer: Self-pay

## 2018-10-04 ENCOUNTER — Ambulatory Visit (INDEPENDENT_AMBULATORY_CARE_PROVIDER_SITE_OTHER): Payer: Medicaid Other | Admitting: Family Medicine

## 2018-10-04 ENCOUNTER — Telehealth: Payer: Self-pay | Admitting: Family Medicine

## 2018-10-04 VITALS — BP 122/88 | HR 104 | Temp 99.2°F | Ht 62.0 in | Wt 142.6 lb

## 2018-10-04 DIAGNOSIS — L509 Urticaria, unspecified: Secondary | ICD-10-CM | POA: Diagnosis not present

## 2018-10-04 DIAGNOSIS — T7840XA Allergy, unspecified, initial encounter: Secondary | ICD-10-CM

## 2018-10-04 DIAGNOSIS — L508 Other urticaria: Secondary | ICD-10-CM | POA: Insufficient documentation

## 2018-10-04 HISTORY — DX: Other urticaria: L50.8

## 2018-10-04 MED ORDER — CETIRIZINE HCL 10 MG PO TABS: 10.0000 mg | ORAL_TABLET | Freq: Every day | ORAL | 3 refills | Status: DC | PRN

## 2018-10-04 NOTE — Assessment & Plan Note (Signed)
Patient with episode of hives.  Unclear etiology at this time as patient has had no known exposure.  Manual pulse regular rate.  Advised that patient take daily cetirizine to help with itching.  Patient may also take Benadryl tonight to alleviate hives and itching.  Told patient that she should not take cetirizine and Benadryl at the same time.  Patient verbalized understanding of this.  Given that patient has had hives multiple times and patient is concerned that she may have other allergies, have sent in referral to allergy specialist for testing.  Patient is aware that this may take some time given recent coronavirus causing many offices to not make nonemergent appointments at this time.  Strict return precautions given.  Discussed signs and symptoms of anaphylaxis and patient verbalized understanding of this.  Stated that she will go to the emergency department for EpiPen or call EMS if she has any of the symptoms. Follow up PRN.

## 2018-10-04 NOTE — Patient Instructions (Signed)
Allergies, Adult An allergy is when your body's defense system (immune system) overreacts to an otherwise harmless substance (allergen) that you breathe in or eat or something that touches your skin. When you come into contact with something that you are allergic to, your immune system produces certain proteins (antibodies). These proteins cause cells to release chemicals (histamines) that trigger the symptoms of an allergic reaction. Allergies often affect the nasal passages (allergic rhinitis), eyes (allergic conjunctivitis), skin (atopic dermatitis), and stomach. Allergies can be mild or severe. Allergies cannot spread from person to person (are not contagious). They can develop at any age and may be outgrown. What increases the risk? You may be at greater risk of allergies if other people in your family have allergies. What are the signs or symptoms? Symptoms depend on what type of allergy you have. They may include:  Runny, stuffy nose.  Sneezing.  Itchy mouth, ears, or throat.  Postnasal drip.  Sore throat.  Itchy, red, watery, or puffy eyes.  Skin rash or hives.  Stomach pain.  Vomiting.  Diarrhea.  Bloating.  Wheezing or coughing. People with a severe allergy to food, medicine, or an insect bite may have a life-threatening allergic reaction (anaphylaxis). Symptoms of anaphylaxis include:  Hives.  Itching.  Flushed face.  Swollen lips, tongue, or mouth.  Tight or swollen throat.  Chest pain or tightness in the chest.  Trouble breathing or shortness of breath.  Rapid heartbeat.  Dizziness or fainting.  Vomiting.  Diarrhea.  Pain in the abdomen. How is this diagnosed? This condition is diagnosed based on:  Your symptoms.  Your family and medical history.  A physical exam. You may need to see a health care provider who specializes in treating allergies (allergist). You may also have tests, including:  Skin tests to see which allergens are causing  your symptoms, such as: ? Skin prick test. In this test, your skin is pricked with a tiny needle and exposed to small amounts of possible allergens to see if your skin reacts. ? Intradermal skin test. In this test, a small amount of allergen is injected under your skin to see if your skin reacts. ? Patch test. In this test, a small amount of allergen is placed on your skin and then your skin is covered with a bandage. Your health care provider will check your skin after a couple of days to see if a rash has developed.  Blood tests.  Challenges tests. In this test, you inhale a small amount of allergen by mouth to see if you have an allergic reaction. You may also be asked to:  Keep a food diary. A food diary is a record of all the foods and drinks you have in a day and any symptoms you experience.  Practice an elimination diet. An elimination diet involves eliminating specific foods from your diet and then adding them back in one by one to find out if a certain food causes an allergic reaction. How is this treated? Treatment for allergies depends on your symptoms. Treatment may include:  Cold compresses to soothe itching and swelling.  Eye drops.  Nasal sprays.  Using a saline spray or container (neti pot) to flush out the nose (nasal irrigation). These methods can help clear away mucus and keep the nasal passages moist.  Using a humidifier.  Oral antihistamines or other medicines to block allergic reaction and inflammation.  Skin creams to treat rashes or itching.  Diet changes to eliminate food allergy triggers.  Repeated exposure to tiny amounts of allergens to build up a tolerance and prevent future allergic reactions (immunotherapy). These include: ? Allergy shots. ? Oral treatment. This involves taking small doses of an allergen under the tongue (sublingual immunotherapy).  Emergency epinephrine injection (auto-injector) in case of an allergic emergency. This is a  self-injectable, pre-measured medicine that must be given within the first few minutes of a serious allergic reaction. Follow these instructions at home:         Avoid known allergens whenever possible.  If you suffer from airborne allergens, wash out your nose daily. You can do this with a saline spray or a neti pot to flush out your nose (nasal irrigation).  Take over-the-counter and prescription medicines only as told by your health care provider.  Keep all follow-up visits as told by your health care provider. This is important.  If you are at risk of a severe allergic reaction (anaphylaxis), keep your auto-injector with you at all times.  If you have ever had anaphylaxis, wear a medical alert bracelet or necklace that states you have a severe allergy. Contact a health care provider if:  Your symptoms do not improve with treatment. Get help right away if:  You have symptoms of anaphylaxis, such as: ? Swollen mouth, tongue, or throat. ? Pain or tightness in your chest. ? Trouble breathing or shortness of breath. ? Dizziness or fainting. ? Severe abdominal pain, vomiting, or diarrhea. This information is not intended to replace advice given to you by your health care provider. Make sure you discuss any questions you have with your health care provider. Document Released: 09/26/2002 Document Revised: 11/01/2016 Document Reviewed: 01/19/2016 Elsevier Interactive Patient Education  2019 [employer].    Anaphylactic Reaction, Adult An anaphylactic reaction (anaphylaxis) is a sudden, serious allergic reaction. This affects more than one part of your body. It can be life-threatening. If you have an anaphylactic reaction, you need to get medical help right away. What are the causes? This condition is caused by exposure to things that give you an allergic reaction (allergens). Common allergens include:  Foods, such as peanuts, wheat, shellfish, milk, and eggs.  Medicines.   Insect bites or stings.  Blood or parts of blood received for treatment (transfusions).  Chemicals, such as latex and dyes that are used in food and in medical tests. What are the signs or symptoms? Signs of an anaphylactic reaction may include:  Feeling warm in the face (flushed). Your face may turn red.  Itchy, red, swollen areas of skin (hives).  Swelling of the: ? Eyes. ? Lips. ? Face. ? Mouth. ? Tongue. ? Throat.  Trouble with any of these: ? Breathing. ? Talking. ? Swallowing.  Loud breathing (wheezing).  Feeling dizzy or light-headed.  Passing out (fainting).  Pain or cramps in your belly.  Throwing up (vomiting).  Watery poop (diarrhea). How is this diagnosed? This condition is diagnosed based on:  Your symptoms.  A physical exam.  Blood tests.  Recent exposure to things that give you an allergic reaction. How is this treated? If you think you are having an anaphylactic reaction, you should do this right away:  Give yourself a shot of medicine (epinephrine) using an auto-injector "pen." Your doctor will teach you how to use this pen.  Call for emergency help. If you use a pen, you must still get treated in the hospital. There, you may be given: ? Medicines. ? Oxygen. ? Fluids in an IV tube. Follow these instructions  at home: Safety  Always keep an auto-injector pen with you. This could save your life. Use it as told by your doctor.  Do not drive after a reaction. Wait until your doctor says it is safe to drive.  Make sure that you, the people who live with you, and your employer know: ? What you are allergic to, so you can stay away from it. ? How to use your auto-injector pen.  Wear a bracelet or necklace that says you have an allergy, if your doctor tells you to do this.  Learn the signs of a very bad allergic reaction. This way, you can treat it right away.  Work with your doctors to make a plan for what to do if you have a very bad  reaction. It is important to be ready. If you use your auto-injector pen:   Get more medicine (epinephrine) for your pen right away. This is important in case you have another reaction.  Get help right away. To avoid a serious allergic reaction:  Avoid things that gave you a very bad allergic reaction before.  Tell your server about your allergy when you go out to eat. If you are not sure if your meal has food that you are allergic to, ask your server before you eat it. General instructions  Take over-the-counter and prescription medicines only as told by your doctor.  If you have itchy, red, swollen areas of skin or a rash: ? Use an over-the-counter medicine (antihistamine) as told by your doctor. ? Put cold, wet cloths on your skin. ? Take a cool bath or shower. Avoid hot water.  Tell all doctors who care for you that you have an allergy.  Keep all follow-up visits as told by your doctor. This is important. Get help right away if:  You have signs of an allergic reaction. You may notice them soon after being exposed to things that give you an allergic reaction. Signs may include: ? Warmth in your face. Your face may turn red. ? Itchy, red, swollen areas of skin. ? Swelling of your:  Eyes.  Lips.  Face.  Mouth.  Tongue.  Throat. ? Trouble with any of these:  Breathing.  Talking.  Swallowing. ? Loud breathing (wheezing). ? Feeling dizzy or light-headed. ? Passing out. ? Pain or cramps in your belly. ? Throwing up. ? Watery poop.  You had to use your auto-injector pen. You must go to the emergency room even if the medicine seems to be working. This is because another allergic reaction may happen within 3 days (rebound anaphylaxis). These symptoms may be an emergency. Do not wait to see if the symptoms will go away. Do this right away:  Use your auto-injector pen as you have been told.  Get medical help. Call your local emergency services (911 in the [place]  Do not drive yourself to the hospital. Summary  An anaphylactic reaction (anaphylaxis) is a sudden, serious allergic reaction.  This condition can be life-threatening. If you have a reaction, get medical help right away.  Your doctor will show you how to give yourself a shot (epinephrine injection) with an auto-injector "pen."  Always keep an auto-injector pen with you. It could save your life. Use it as told by your doctor.  If you had to use your auto-injector pen, you must go to the emergency room. Go there even if the medicine seems to be working. This information is not intended to replace advice given to you by  your health care provider. Make sure you discuss any questions you have with your health care provider. Document Released: 12/20/2007 Document Revised: 10/25/2017 Document Reviewed: 10/25/2017 Elsevier Interactive Patient Education  [employer].

## 2018-10-04 NOTE — Progress Notes (Signed)
   Subjective:    Patient ID: Michelle [name], female    DOB: 03-Jun-1991, 28 y.o.   MRN: [id number]   CC: hives  HPI: Hives Presenting today for concerns of hives.  States that she began to have a rash x1 day.  Started as a few bumps but then became bigger and looks like her hives.  2 to 3 hours ago she started having these hives.  Patient then became shaky 30 minutes after.  Started having hives and body feels shaky on the inside "feels like a chuahuaha". Denies any tongue or throat swelling.  Denies any shortness of breath.  Denies any nausea, vomiting, diarrhea.  Patient has not tried any oral medications.  Has used cortisone 10 but this appears to make the rash worse.  No sick contacts.  No new cosmetic products.  No new foods.  No insect exposure.  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  No recent travel or exposure to those who have traveled.  Patient has had hives in the past a few months ago when it was an allergic reaction to oatmeal cookies.  Patient has only felt shaky one other time when she had sepsis.  Patient reports that rash is significantly improved now.  Does report that she has some itching.   Objective:  BP 122/88   Pulse (!) 104   Temp 99.2 F (37.3 C) (Oral)   Ht 5\' 2"  (1.575 m)   Wt 142 lb 9.6 oz (64.7 kg)   LMP 08/14/2016 (Exact Date)   SpO2 99%   BMI 26.08 kg/m  Vitals and nursing note reviewed  General: well nourished, in no acute distress HEENT: normocephalic, TM's visualized bilaterally, PERRL, no scleral icterus or conjunctival pallor, no nasal discharge, moist mucous membranes, good dentition without erythema or discharge noted in posterior oropharynx Neck: supple, non-tender, without lymphadenopathy Cardiac: RRR, clear S1 and S2, no murmurs, rubs, or gallops Respiratory: clear to auscultation bilaterally, no increased work of breathing Abdomen: soft, nontender, nondistended, no masses or organomegaly. Bowel sounds present Extremities: no edema  or cyanosis. Warm, well perfused. 2+ radial pulses bilaterally Skin: warm and dry, small area of erythema on left wrist and on right forearm, not raised Neuro: alert and oriented, no focal deficits   Assessment & Plan:    Hives Patient with episode of hives.  Unclear etiology at this time as patient has had no known exposure.  Manual pulse regular rate.  Advised that patient take daily cetirizine to help with itching.  Patient may also take Benadryl tonight to alleviate hives and itching.  Told patient that she should not take cetirizine and Benadryl at the same time.  Patient verbalized understanding of this.  Given that patient has had hives multiple times and patient is concerned that she may have other allergies, have sent in referral to allergy specialist for testing.  Patient is aware that this may take some time given recent coronavirus causing many offices to not make nonemergent appointments at this time.  Strict return precautions given.  Discussed signs and symptoms of anaphylaxis and patient verbalized understanding of this.  Stated that she will go to the emergency department for EpiPen or call EMS if she has any of the symptoms. Follow up PRN.     Return if symptoms worsen or fail to improve.   Caroline More, DO, PGY-2

## 2018-10-04 NOTE — Telephone Encounter (Signed)
Patient calls wanting appt today. She woke up last night with her hand itching and hives on the other wrist. Once a while back she had a similar episode where she felt shaky and was in the ED with sepsis she thinks. She had hives on her legs in the past, no known allergens at that point. Her breathing is ok. Recommended she be seen today, sch for 3:50 ATC appt, will route to ATC provider.

## 2018-10-05 ENCOUNTER — Other Ambulatory Visit: Payer: Self-pay

## 2018-10-05 ENCOUNTER — Emergency Department (HOSPITAL_COMMUNITY)
Admission: EM | Admit: 2018-10-05 | Discharge: 2018-10-05 | Disposition: A | Discharge status: Home / Self Care | Payer: Medicaid Other | Attending: Emergency Medicine | Admitting: Emergency Medicine

## 2018-10-05 DIAGNOSIS — Z87891 Personal history of nicotine dependence: Secondary | ICD-10-CM | POA: Diagnosis not present

## 2018-10-05 DIAGNOSIS — Z79899 Other long term (current) drug therapy: Secondary | ICD-10-CM | POA: Diagnosis not present

## 2018-10-05 DIAGNOSIS — J45909 Unspecified asthma, uncomplicated: Secondary | ICD-10-CM | POA: Insufficient documentation

## 2018-10-05 DIAGNOSIS — T7840XA Allergy, unspecified, initial encounter: Secondary | ICD-10-CM

## 2018-10-05 MED ORDER — PREDNISONE 20 MG PO TABS: 40.0000 mg | ORAL_TABLET | Freq: Every day | ORAL | 0 refills | Status: AC

## 2018-10-05 MED ORDER — FAMOTIDINE 20 MG PO TABS
20.0000 mg | ORAL_TABLET | Freq: Once | ORAL | Status: AC
  Administered 2018-10-05: 20 mg via ORAL
  Filled 2018-10-05: qty 1

## 2018-10-05 MED ORDER — DIPHENHYDRAMINE HCL 25 MG PO TABS: 25.0000 mg | ORAL_TABLET | Freq: Four times a day (QID) | ORAL | 0 refills | Status: DC

## 2018-10-05 MED ORDER — FAMOTIDINE 20 MG PO TABS: 20.0000 mg | ORAL_TABLET | Freq: Two times a day (BID) | ORAL | 0 refills | Status: DC

## 2018-10-05 MED ORDER — PREDNISONE 20 MG PO TABS
60.0000 mg | ORAL_TABLET | Freq: Once | ORAL | Status: AC
  Administered 2018-10-05: 60 mg via ORAL
  Filled 2018-10-05: qty 3

## 2018-10-05 NOTE — ED Triage Notes (Addendum)
Pt reports an allergic reaction causing hives to bilateral arms unknown cause. Benadryl x2 taken today and hydroxzine yesterday with some relief. Also reports some SOB "feeling like needing inhaler".Marland Kitchen NAD

## 2018-10-05 NOTE — ED Provider Notes (Signed)
[facility]PART[facility] Provider Note   CSN: [id number] Arrival date & time: 10/05/18  1944    History   Chief Complaint Chief Complaint  Patient presents with  . Allergic Reaction    HPI Michelle [name] is a 28 y.o. female.     HPI Patient is a 28 year old female with a history of asthma and eczema who presents the emergency room with complaint of allergic reaction.  She reports rash and itching of her bilateral forearms with hives yesterday.  She is tried Benadryl with some improvement but today began feeling mild shortness of breath and thus came to the ER for evaluation.  She has taken Zyrtec and Benadryl today with some improvement.  No tongue swelling.  No lip swelling.  No feeling like her throat is closing off chest mild feeling of occasional shortness of breath.  No recent illness.  No cough or congestion.  Symptoms are mild in severity.  No other complaints   Past Medical History:  Diagnosis Date  . Anxiety   . Asthma   . Bipolar disorder ([place])    no current med.  . Depression    no current med.  . Family history of adverse reaction to anesthesia    states mother and sister are hard to wake up post-op  . Gestational diabetes   . History of seizure 06/20/2012   x 1 - during delivery of child(eclampsia)  . Hypertension   . Lipoma of lower back 08/2015  . Migraine   . PTSD (post-traumatic stress disorder)   . PTSD (post-traumatic stress disorder)   . Tachycardia   . TMJ (dislocation of temporomandibular joint)   . Vertigo     Patient Active Problem List   Diagnosis Date Noted  . Hives 10/04/2018  . Rash and nonspecific skin eruption 11/16/2017  . Easy bruising 11/16/2017  . Anxiety state 10/27/2016  . Migraine 02/10/2016  . PTSD 03/02/2010  . 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 ABDOMINAL HYSTERECTOMY    . ADENOIDECTOMY, TONSILLECTOMY AND MYRINGOTOMY WITH  TUBE PLACEMENT  1990's  . CESAREAN SECTION  06/20/2012   Procedure: CESAREAN SECTION;  Surgeon: Emily Filbert, MD;  Location: [facility];  Service: Obstetrics;  Laterality: N/A;  . LAPAROSCOPIC APPENDECTOMY  07/25/2012   Procedure: APPENDECTOMY LAPAROSCOPIC;  Surgeon: Zenovia Jarred, MD;  Location: [facility];  Service: General;  Laterality: N/A;  . LAPAROSCOPIC TUBAL LIGATION Bilateral 08/04/2014   Procedure: BILATERAL LAPAROSCOPIC TUBAL LIGATION;  Surgeon: Woodroe Mode, MD;  Location: [facility];  Service: Gynecology;  Laterality: Bilateral;  . LIPOMA EXCISION N/A 09/16/2015   Procedure: EXCISION LIPOMA LUMBAR REGION;  Surgeon: Donnie Mesa, MD;  Location: [facility];  Service: General;  Laterality: N/A;  . WISDOM TOOTH EXTRACTION       OB History    Gravida  3   Para  3   Term  2   Preterm  1   AB  0   Living  3     SAB  0   TAB  0   Ectopic  0   Multiple  0   Live Births  1        Obstetric Comments  C-Section Indication: Non-reassuring fetal tracing, growth delay & marked proteinuria & Pre-Eclampsia Eclamptic Seizure during C-section delivery         Home Medications    Prior to Admission  medications   Medication Sig Start Date End Date Taking? Authorizing Provider  albuterol (PROAIR HFA) 108 (90 Base) MCG/ACT inhaler Inhale 2 puffs into the lungs every 6 (six) hours as needed for wheezing or shortness of breath.   Yes [provider]  clindamycin-benzoyl peroxide (BENZACLIN) gel Apply 1 application topically at bedtime.  09/27/18  Yes [provider]  doxycycline (MONODOX) 100 MG capsule Take 100 mg by mouth 2 (two) times daily. 09/27/18  Yes [provider]  hydrOXYzine (ATARAX/VISTARIL) 25 MG tablet Take 50 mg by mouth 3 (three) times daily as needed for itching.   Yes [provider]  ibuprofen (ADVIL,MOTRIN) 200 MG tablet Take 800 mg by mouth every 6 (six) hours as needed (pain).   Yes [provider]  naproxen  (NAPROSYN) 500 MG tablet Take 500 mg by mouth every 12 (twelve) hours as needed for mild pain.  09/26/18  Yes [provider]  RETIN-A 0.025 % cream Apply 1 application topically See admin instructions. Apply a pea-sized amount to the face nightly 09/27/18  Yes [provider]  amitriptyline (ELAVIL) 75 MG tablet Take 1 tablet (75 mg total) by mouth at bedtime as needed for sleep. Patient not taking: Reported on 10/05/2018 07/23/18   Raylene Everts, MD  diphenhydrAMINE (BENADRYL) 25 MG tablet Take 1 tablet (25 mg total) by mouth every 6 (six) hours. 10/05/18   Jola Schmidt, MD  famotidine (PEPCID) 20 MG tablet Take 1 tablet (20 mg total) by mouth 2 (two) times daily. 10/05/18   Jola Schmidt, MD  predniSONE (DELTASONE) 20 MG tablet Take 2 tablets (40 mg total) by mouth daily for 5 days. 10/05/18 10/10/18  Jola Schmidt, MD    Family History Family History  Problem Relation Age of Onset  . Breast cancer Paternal Grandmother   . Colon cancer Paternal Grandfather   . Colon cancer Maternal Grandfather   . Colon polyps Maternal Aunt   . Diabetes Mother   . Anesthesia problems Mother        hard to wake up post-op  . Diabetes Sister   . Anesthesia problems Sister        hard to wake up post-op  . Diabetes Father   . Heart disease Father     Social History Social History   Tobacco Use  . Smoking status: Former Smoker    Packs/day: 0.00    Years: 14.00    Pack years: 0.00    Types: Cigarettes    Last attempt to quit: 03/17/2017    Years since quitting: 1.5  . Smokeless tobacco: Never Used  Substance Use Topics  . Alcohol use: No  . Drug use: No     Allergies   Adhesive [tape]; Bee venom; and Oatmeal   Review of Systems Review of Systems  All other systems reviewed and are negative.    Physical Exam Updated Vital Signs BP 119/78   Pulse 95   Temp 98.8 F (37.1 C) (Oral)   Ht 5\' 2"  (1.575 m)   Wt 64.7 kg   LMP 08/14/2016 (Exact Date)   SpO2 100%   BMI  26.08 kg/m   Physical Exam Vitals signs and nursing note reviewed.  Constitutional:      General: She is not in acute distress.    Appearance: She is well-developed.  HENT:     Head: Normocephalic and atraumatic.  Neck:     Musculoskeletal: Normal range of motion and neck supple.  Cardiovascular:  Rate and Rhythm: Normal rate and regular rhythm.     Heart sounds: Normal heart sounds.  Pulmonary:     Effort: Pulmonary effort is normal.     Breath sounds: Normal breath sounds. No stridor. No wheezing.  Abdominal:     General: There is no distension.     Palpations: Abdomen is soft.     Tenderness: There is no abdominal tenderness.  Musculoskeletal: Normal range of motion.  Skin:    General: Skin is warm and dry.  Neurological:     Mental Status: She is alert and oriented to person, place, and time.  Psychiatric:        Judgment: Judgment normal.      ED Treatments / Results  Labs (all labs ordered are listed, but only abnormal results are displayed) Labs Reviewed - No data to display  EKG None  Radiology No results found.  Procedures Procedures (including critical care time)  Medications Ordered in ED Medications  predniSONE (DELTASONE) tablet 60 mg (has no administration in time range)  famotidine (PEPCID) tablet 20 mg (has no administration in time range)     Initial Impression / Assessment and Plan / ED Course  I have reviewed the triage vital signs and the nursing notes.  Pertinent labs & imaging results that were available during my care of the patient were reviewed by me and considered in my medical decision making (see chart for details).        No stridor.  Tolerating secretions.  Oral airway patent.  Recommended prednisone Benadryl and Pepcid for associated mild allergic reaction.  No airway compromise.  No significant hives at this time.  Discharged home in good condition.  Return precautions given  Final Clinical Impressions(s) / ED  Diagnoses   Final diagnoses:  Allergic reaction, initial encounter    ED Discharge Orders         Ordered    predniSONE (DELTASONE) 20 MG tablet  Daily     10/05/18 2106    famotidine (PEPCID) 20 MG tablet  2 times daily     10/05/18 2106    diphenhydrAMINE (BENADRYL) 25 MG tablet  Every 6 hours     10/05/18 2106           Jola Schmidt, MD 10/05/18 2112

## 2018-10-07 ENCOUNTER — Telehealth: Payer: Self-pay

## 2018-10-07 NOTE — Telephone Encounter (Signed)
Spoke with pt's advising that we are not currently seeing pt's in the office due to COVID-19 pandemic.  Verified email and telephone number in system are correct.  Advised that our office will be in touch to schedule Webex visit.

## 2018-10-11 ENCOUNTER — Ambulatory Visit: Payer: Medicaid Other | Admitting: Neurology

## 2018-10-13 ENCOUNTER — Encounter (HOSPITAL_COMMUNITY): Payer: Self-pay | Admitting: *Deleted

## 2018-10-13 ENCOUNTER — Other Ambulatory Visit: Payer: Self-pay

## 2018-10-13 ENCOUNTER — Ambulatory Visit (HOSPITAL_COMMUNITY)
Admission: EM | Admit: 2018-10-13 | Discharge: 2018-10-13 | Disposition: A | Discharge status: Home / Self Care | Payer: Medicaid Other | Attending: Family Medicine | Admitting: Family Medicine

## 2018-10-13 DIAGNOSIS — J4520 Mild intermittent asthma, uncomplicated: Secondary | ICD-10-CM | POA: Diagnosis not present

## 2018-10-13 DIAGNOSIS — J4521 Mild intermittent asthma with (acute) exacerbation: Secondary | ICD-10-CM | POA: Diagnosis not present

## 2018-10-13 MED ORDER — IPRATROPIUM-ALBUTEROL 0.5-2.5 (3) MG/3ML IN SOLN: 3.0000 mL | RESPIRATORY_TRACT | 0 refills | Status: DC | PRN

## 2018-10-13 NOTE — ED Provider Notes (Signed)
[place]    CSN: [id number] Arrival date & time: 10/13/18  1246     History   Chief Complaint Chief Complaint  Patient presents with  . Shortness of Breath  . Dizziness    HPI Michelle [name] is a 28 y.o. female.   HPI  Patient has asthma.  Mild intermittent.  Usually controlled with as needed albuterol.  She has been having worsening symptoms for the last 3 days.  Increased shortness of breath.  Not relieved by her usual albuterol.  No fever or chills.  No cough.  No sputum production.  No known exposure to illness or COVID-19.  This feels like her usual asthma and allergies to her.  Past Medical History:  Diagnosis Date  . Anxiety   . Asthma   . Bipolar disorder ([place])    no current med.  . Depression    no current med.  . Family history of adverse reaction to anesthesia    states mother and sister are hard to wake up post-op  . Gestational diabetes   . History of seizure 06/20/2012   x 1 - during delivery of child(eclampsia)  . Hypertension   . Lipoma of lower back 08/2015  . Migraine   . PTSD (post-traumatic stress disorder)   . PTSD (post-traumatic stress disorder)   . Seizures ([place])   . Tachycardia   . TMJ (dislocation of temporomandibular joint)   . Vertigo     Patient Active Problem List   Diagnosis Date Noted  . Hives 10/04/2018  . Rash and nonspecific skin eruption 11/16/2017  . Easy bruising 11/16/2017  . Anxiety state 10/27/2016  . Migraine 02/10/2016  . PTSD 03/02/2010  . 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 ABDOMINAL HYSTERECTOMY    . ADENOIDECTOMY, TONSILLECTOMY AND MYRINGOTOMY WITH TUBE PLACEMENT  1990's  . APPENDECTOMY    . CESAREAN SECTION  06/20/2012   Procedure: CESAREAN SECTION;  Surgeon: Emily Filbert, MD;  Location: [facility];  Service: Obstetrics;  Laterality: N/A;  . LAPAROSCOPIC APPENDECTOMY  07/25/2012   Procedure: APPENDECTOMY LAPAROSCOPIC;  Surgeon:  Zenovia Jarred, MD;  Location: [facility];  Service: General;  Laterality: N/A;  . LAPAROSCOPIC TUBAL LIGATION Bilateral 08/04/2014   Procedure: BILATERAL LAPAROSCOPIC TUBAL LIGATION;  Surgeon: Woodroe Mode, MD;  Location: [facility];  Service: Gynecology;  Laterality: Bilateral;  . LIPOMA EXCISION N/A 09/16/2015   Procedure: EXCISION LIPOMA LUMBAR REGION;  Surgeon: Donnie Mesa, MD;  Location: [facility];  Service: General;  Laterality: N/A;  . TONSILLECTOMY    . WISDOM TOOTH EXTRACTION      OB History    Gravida  3   Para  3   Term  2   Preterm  1   AB  0   Living  3     SAB  0   TAB  0   Ectopic  0   Multiple  0   Live Births  1        Obstetric Comments  C-Section Indication: Non-reassuring fetal tracing, growth delay & marked proteinuria & Pre-Eclampsia Eclamptic Seizure during C-section delivery         Home Medications    Prior to Admission medications   Medication Sig Start Date End Date Taking? Authorizing Provider  albuterol (PROAIR HFA) 108 (90 Base) MCG/ACT inhaler Inhale 2 puffs into the lungs every 6 (six) hours as needed  for wheezing or shortness of breath.   Yes [provider]  Cetirizine HCl (ZYRTEC PO) Take by mouth.   Yes [provider]  clindamycin-benzoyl peroxide (BENZACLIN) gel Apply 1 application topically at bedtime.  09/27/18  Yes [provider]  doxycycline (MONODOX) 100 MG capsule Take 100 mg by mouth 2 (two) times daily. 09/27/18  Yes [provider]  famotidine (PEPCID) 20 MG tablet Take 1 tablet (20 mg total) by mouth 2 (two) times daily. 10/05/18  Yes Jola Schmidt, MD  RETIN-A 0.025 % cream Apply 1 application topically See admin instructions. Apply a pea-sized amount to the face nightly 09/27/18  Yes [provider]  ibuprofen (ADVIL,MOTRIN) 200 MG tablet Take 800 mg by mouth every 6 (six) hours as needed (pain).    [provider]  ipratropium-albuterol (DUONEB)  0.5-2.5 (3) MG/3ML SOLN Take 3 mLs by nebulization every 4 (four) hours as needed. 10/13/18   Raylene Everts, MD  naproxen (NAPROSYN) 500 MG tablet Take 500 mg by mouth every 12 (twelve) hours as needed for mild pain.  09/26/18   [provider]    Family History Family History  Problem Relation Age of Onset  . Breast cancer Paternal Grandmother   . Colon cancer Paternal Grandfather   . Colon cancer Maternal Grandfather   . Colon polyps Maternal Aunt   . Diabetes Mother   . Anesthesia problems Mother        hard to wake up post-op  . Diabetes Sister   . Anesthesia problems Sister        hard to wake up post-op  . Diabetes Father   . Heart disease Father     Social History Social History   Tobacco Use  . Smoking status: Former Smoker    Packs/day: 0.00    Years: 14.00    Pack years: 0.00    Types: Cigarettes    Last attempt to quit: 03/17/2017    Years since quitting: 1.5  . Smokeless tobacco: Never Used  Substance Use Topics  . Alcohol use: No  . Drug use: No     Allergies   Adhesive [tape]; Bee venom; and Oatmeal   Review of Systems Review of Systems   Physical Exam Triage Vital Signs ED Triage Vitals  Enc Vitals Group     BP 10/13/18 1358 107/74     Pulse Rate 10/13/18 1356 (!) 118     Resp 10/13/18 1356 18     Temp --      Temp src --      SpO2 10/13/18 1356 100 %     Weight --      Height --      Head Circumference --      Peak Flow --      Pain Score 10/13/18 1357 0     Pain Loc --      Pain Edu? --      Excl. in [place]? --    No data found.  Updated Vital Signs BP 107/74   Pulse (!) 118 Comment: states normal HR for her when she is not taking iron  Resp 18   LMP 08/14/2016 (Exact Date)   SpO2 100%       Physical Exam Constitutional:      General: She is not in acute distress.    Appearance: She is well-developed.  HENT:     Head: Normocephalic and atraumatic.     Comments: Clear rhinorrhea    Mouth/Throat:  Pharynx: No  oropharyngeal exudate.  Eyes:     Conjunctiva/sclera: Conjunctivae normal.     Pupils: Pupils are equal, round, and reactive to light.  Neck:     Musculoskeletal: Normal range of motion.  Cardiovascular:     Rate and Rhythm: Normal rate and regular rhythm.     Heart sounds: Normal heart sounds.  Pulmonary:     Effort: Pulmonary effort is normal. No respiratory distress.     Breath sounds: No decreased breath sounds or wheezing.  Chest:     Chest wall: No tenderness.  Abdominal:     General: There is no distension.     Palpations: Abdomen is soft.  Musculoskeletal: Normal range of motion.  Lymphadenopathy:     Cervical: No cervical adenopathy.  Skin:    General: Skin is warm and dry.  Neurological:     Mental Status: She is alert.  Psychiatric:        Mood and Affect: Mood normal.        Behavior: Behavior normal.      UC Treatments / Results  Labs (all labs ordered are listed, but only abnormal results are displayed) Labs Reviewed - No data to display  EKG None  Radiology No results found.  Procedures Procedures (including critical care time)  Medications Ordered in UC Medications - No data to display  Initial Impression / Assessment and Plan / UC Course  I have reviewed the triage vital signs and the nursing notes.  Pertinent labs & imaging results that were available during my care of the patient were reviewed by me and considered in my medical decision making (see chart for details).     Patient dispensed a nebulizer for home use Final Clinical Impressions(s) / UC Diagnoses   Final diagnoses:  Mild intermittent asthma with acute exacerbation     Discharge Instructions     Rest and push fluids Continue albuterol inhaler as needed When this does not work use the DuoNeb solution in the nebulizer Continue your usual allergy medicine Call your doctor if not better in a couple days    ED Prescriptions    Medication Sig Dispense Auth. Provider    ipratropium-albuterol (DUONEB) 0.5-2.5 (3) MG/3ML SOLN Take 3 mLs by nebulization every 4 (four) hours as needed. 360 mL Raylene Everts, MD     Controlled Substance Prescriptions [place] Controlled Substance Registry consulted? Not Applicable   Raylene Everts, MD 10/15/18 [phone]

## 2018-10-13 NOTE — Discharge Instructions (Signed)
Rest and push fluids Continue albuterol inhaler as needed When this does not work use the DuoNeb solution in the nebulizer Continue your usual allergy medicine Call your doctor if not better in a couple days

## 2018-10-13 NOTE — ED Triage Notes (Signed)
C/O SOB x 3 days.  Since yesterday has been lightheaded.  States feels it is her asthma/wheezing.  Denies cough, congestion, or fever.  Has been using albuterol HFA without relief.

## 2018-10-15 ENCOUNTER — Other Ambulatory Visit: Payer: Self-pay

## 2018-10-15 ENCOUNTER — Other Ambulatory Visit: Payer: Self-pay | Admitting: Family Medicine

## 2018-10-15 ENCOUNTER — Telehealth: Payer: Self-pay | Admitting: Family Medicine

## 2018-10-15 ENCOUNTER — Ambulatory Visit (INDEPENDENT_AMBULATORY_CARE_PROVIDER_SITE_OTHER): Payer: Medicaid Other | Admitting: Family Medicine

## 2018-10-15 VITALS — BP 98/62 | HR 108 | Temp 98.4°F

## 2018-10-15 DIAGNOSIS — J4531 Mild persistent asthma with (acute) exacerbation: Secondary | ICD-10-CM

## 2018-10-15 DIAGNOSIS — R0602 Shortness of breath: Secondary | ICD-10-CM | POA: Diagnosis not present

## 2018-10-15 MED ORDER — BECLOMETHASONE DIPROP HFA 80 MCG/ACT IN AERB: 2.0000 | INHALATION_SPRAY | Freq: Two times a day (BID) | RESPIRATORY_TRACT | 12 refills | Status: DC

## 2018-10-15 MED ORDER — PREDNISONE 20 MG PO TABS: 40.0000 mg | ORAL_TABLET | Freq: Every day | ORAL | 0 refills | Status: AC

## 2018-10-15 NOTE — Patient Instructions (Signed)
Thank you for coming to see me today. It was a pleasure! Today we talked about:   Your shortness of breath.  I have given you a burst of prednisone.  Please take 40 mg (2 tablets) daily for 5 days.  Please also start taking your daily inhaled corticosteroid, Qvar.  Take this every day whether you are having trouble breathing or not.  It is to decrease the amount of times that you require your albuterol.  If you are having continued shortness of breath and please use your albuterol as needed.  If you develop worsening shortness of breath that resolved with albuterol or you develop any fevers or chills then please go to the emergency room.  Otherwise given the risk of COVID-19, it is very important that you practice social distancing and only go outside for emergencies.  Be sure to wash your hands as frequently as possible.  Please follow-up as needed.  If you have any questions or concerns, please do not hesitate to call the office at [phone].  Take Care,   [place] Kimmy Parish, DO

## 2018-10-15 NOTE — Telephone Encounter (Signed)
[facility] Telephone Triage Note  Patient called in reporting shortness of breath. Has had shortness of breath for several days. Seen at UC two days ago and given rx for nebulizer machine. Has history of asthma. Not reported to be wheezing at time of urgent care visit. Has been using nebs at home but even an hour later begins to feel dyspneic again.  Patient has no fever or cough, just shortness of breath where she feels like she cannot get a deep breath.   Patient speaking normally in full sentences throughout the phone encounter, without any respiratory distress evident.   Given COVID pandemic we have been asked to send all respiratory patients who require evaluation for dyspnea to the ED. Advised patient she needs to go to the ED for evaluation. Patient understood.  Chrisandra Netters, MD [place]

## 2018-10-15 NOTE — Progress Notes (Signed)
Subjective:  Patient ID: Michelle [name]  DOB: 1990-11-04 MRN: [id number]  Michelle [name] is a 28 y.o. female with a PMH of eczema, asthma, bipolar, here today for shortness of breath.  HPI:  Patient with accompanying daughter during daughter's follow-up visit for strep throat and new rash.  Upon initiation of visit with daughter patient requested that I listen to her lungs as she has had trouble with seasonal allergies and asthma for the past few months.  She stated that she was not able to be seen by an allergist given that they continue to reschedule. Patient denied any fevers, cough, chills, association with anyone who had tested positive for COVID-19.   After listening to patient's lungs at her request and letting her know they were clear, patient stated that she was worried because for the past week she has had increased shortness of breath.  She said she had completed a one-week course of prednisone for a rash and after completing the course of prednisone developed shortness of breath.  Patient states that it is happening multiple times per day and is relieved by albuterol.  After being notified of the shortness of breath, patient also told me that she had spoken with the provider over the phone earlier today who advised her to go to the ED.  At this point, I recommended patient put on a mask and then left the room to put on my PPE. Upon return to the room patient advised to take prednisone burst and start Qvar and she was rushed out of clinic while wearing a mask.  Patient was advised social distance and only go out in case of emergencies as well as to wash her hands.  ROS: All other systems otherwise negative, except as mentioned in HPI  Smoking status reviewed  Patient Active Problem List   Diagnosis Date Noted  . Shortness of breath 10/15/2018  . Hives 10/04/2018  . Rash and nonspecific skin eruption 11/16/2017  . Easy bruising 11/16/2017  . Anxiety state 10/27/2016  . Migraine  02/10/2016  . PTSD 03/02/2010  . DSORD [place], [employer], 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     Objective:  BP 98/62   Pulse (!) 108   Temp 98.4 F (36.9 C) (Oral)   LMP 08/14/2016 (Exact Date)   SpO2 99%   Vitals and nursing note reviewed  General: NAD, pleasant Cardiac: Tachycardic, regular rhythm, normal heart sounds, no m/r/g Pulm: normal effort, CTAB Extremities: no edema or cyanosis. WWP. Skin: warm and dry, no rashes noted Neuro: alert and oriented, no focal deficits Psych: normal affect, normal thought content  Assessment & Plan:   Shortness of breath Patient reporting shortness of breath multiple times a day for the past week.  States that it is improved with albuterol nebulizer.  Patient was seen at urgent care and given more albuterol at the time.  Patient reports that symptoms started after she has completed her prednisone burst from a rash a week ago.  Likely due to worsening of asthma during allergy season given that patient also has history of seasonal allergies.  Patient afebrile, without cough, and without any known contacts to COVID making pneumonia or viral respiratory infection such as COVID low risk.  We will give patient 5 days of 40 mg prednisone along with daily steroid inhaler to be a controller.  Patient also with tachycardia noted during visit but has history of this.  Patient is not on any OCPs, denies being immobile, does report that she occasionally  smokes a vape with nicotine but it is not every day.  Given that patient has history of the tachycardia and short of breath is intermittent and improved with albuterol less likely that this is a PE.  Patient given strict return precautions.  Will mail patient her AVS as she was not initially here for an encounter.  Given that shortness of breath could be early signs of COVID-19, discussed with faculty.  My encounter with patient was less than 5 minutes duration within 6 feet while patient was not wearing mask.    [place] Oona Trammel, DO Family Medicine Resident PGY-2

## 2018-10-15 NOTE — Assessment & Plan Note (Addendum)
Patient reporting shortness of breath multiple times a day for the past week.  States that it is improved with albuterol nebulizer.  Patient was seen at urgent care and given more albuterol at the time.  Patient reports that symptoms started after she has completed her prednisone burst from a rash a week ago.  Likely due to worsening of asthma during allergy season given that patient also has history of seasonal allergies.  Patient afebrile, without cough, and without any known contacts to COVID making pneumonia or viral respiratory infection such as COVID low risk.  We will give patient 5 days of 40 mg prednisone along with daily steroid inhaler to be a controller.  Patient also with tachycardia noted during visit but has history of this.  Patient is not on any OCPs, denies being immobile, does report that she occasionally smokes a vape with nicotine but it is not every day.  Given that patient has history of the tachycardia and short of breath is intermittent and improved with albuterol less likely that this is a PE.  Patient given strict return precautions.  Will mail patient her AVS as she was not initially here for an encounter.  Given that shortness of breath could be early signs of COVID-19, discussed with faculty.  My encounter with patient was less than 5 minutes duration within 6 feet while patient was not wearing mask.

## 2018-10-16 NOTE — Progress Notes (Signed)
Asked [name] to assist in finding the AVS for Michelle [name] as well. She looked in AVS reports and saw that there was no AVS created for that visit. Salvatore Marvel, CMA

## 2018-10-19 ENCOUNTER — Ambulatory Visit (HOSPITAL_COMMUNITY)
Admission: EM | Admit: 2018-10-19 | Discharge: 2018-10-19 | Disposition: A | Discharge status: Home / Self Care | Payer: Medicaid Other | Attending: Family Medicine | Admitting: Family Medicine

## 2018-10-19 ENCOUNTER — Encounter (HOSPITAL_COMMUNITY): Payer: Self-pay | Admitting: Emergency Medicine

## 2018-10-19 ENCOUNTER — Other Ambulatory Visit: Payer: Self-pay

## 2018-10-19 DIAGNOSIS — L923 Foreign body granuloma of the skin and subcutaneous tissue: Secondary | ICD-10-CM

## 2018-10-19 MED ORDER — TRIAMCINOLONE ACETONIDE 0.1 % EX CREA: 1.0000 "application " | TOPICAL_CREAM | Freq: Two times a day (BID) | CUTANEOUS | 0 refills | Status: DC

## 2018-10-19 MED ORDER — CLOTRIMAZOLE 1 % EX CREA: TOPICAL_CREAM | CUTANEOUS | 0 refills | Status: DC

## 2018-10-19 NOTE — ED Provider Notes (Signed)
[place]    CSN: [id number] Arrival date & time: 10/19/18  1034     History   Chief Complaint Chief Complaint  Patient presents with  . Rash    HPI Michelle [name] is a 28 y.o. female history of hypertension, bipolar type I, presenting today for evaluation of tattoo irritation.  States that she had a tattoo early February, approximately 2 months ago.  Over the past couple days she has had increased itching and small bumps over her tattoo.  Denies any pain or redness.  She has been applying unscented lotion to this area.  She has never had any issues similar to other tattoos.  Denies any new soaps, lotions or detergents.  HPI  Past Medical History:  Diagnosis Date  . Anxiety   . Asthma   . Bipolar disorder ([place])    no current med.  . Depression    no current med.  . Family history of adverse reaction to anesthesia    states mother and sister are hard to wake up post-op  . Gestational diabetes   . History of seizure 06/20/2012   x 1 - during delivery of child(eclampsia)  . Hypertension   . Lipoma of lower back 08/2015  . Migraine   . PTSD (post-traumatic stress disorder)   . PTSD (post-traumatic stress disorder)   . Seizures ([place])   . Tachycardia   . TMJ (dislocation of temporomandibular joint)   . Vertigo     Patient Active Problem List   Diagnosis Date Noted  . Shortness of breath 10/15/2018  . Hives 10/04/2018  . Rash and nonspecific skin eruption 11/16/2017  . Easy bruising 11/16/2017  . Anxiety state 10/27/2016  . Migraine 02/10/2016  . PTSD 03/02/2010  . 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 ABDOMINAL HYSTERECTOMY    . ADENOIDECTOMY, TONSILLECTOMY AND MYRINGOTOMY WITH TUBE PLACEMENT  1990's  . APPENDECTOMY    . CESAREAN SECTION  06/20/2012   Procedure: CESAREAN SECTION;  Surgeon: Emily Filbert, MD;  Location: [facility];  Service: Obstetrics;  Laterality: N/A;  . LAPAROSCOPIC  APPENDECTOMY  07/25/2012   Procedure: APPENDECTOMY LAPAROSCOPIC;  Surgeon: Zenovia Jarred, MD;  Location: [facility];  Service: General;  Laterality: N/A;  . LAPAROSCOPIC TUBAL LIGATION Bilateral 08/04/2014   Procedure: BILATERAL LAPAROSCOPIC TUBAL LIGATION;  Surgeon: Woodroe Mode, MD;  Location: [facility];  Service: Gynecology;  Laterality: Bilateral;  . LIPOMA EXCISION N/A 09/16/2015   Procedure: EXCISION LIPOMA LUMBAR REGION;  Surgeon: Donnie Mesa, MD;  Location: [facility];  Service: General;  Laterality: N/A;  . TONSILLECTOMY    . WISDOM TOOTH EXTRACTION      OB History    Gravida  3   Para  3   Term  2   Preterm  1   AB  0   Living  3     SAB  0   TAB  0   Ectopic  0   Multiple  0   Live Births  1        Obstetric Comments  C-Section Indication: Non-reassuring fetal tracing, growth delay & marked proteinuria & Pre-Eclampsia Eclamptic Seizure during C-section delivery         Home Medications    Prior to Admission medications   Medication Sig Start Date End Date Taking? Authorizing Provider  albuterol (PROAIR HFA) 108 (90 Base) MCG/ACT inhaler Inhale 2 puffs  into the lungs every 6 (six) hours as needed for wheezing or shortness of breath.    [provider]  beclomethasone (QVAR REDIHALER) 80 MCG/ACT inhaler Inhale 2 puffs into the lungs 2 (two) times daily. 10/15/18   Shirley, [place], DO  Cetirizine HCl (ZYRTEC PO) Take by mouth.    [provider]  clindamycin-benzoyl peroxide (BENZACLIN) gel Apply 1 application topically at bedtime.  09/27/18   [provider]  clotrimazole (LOTRIMIN) 1 % cream Apply to affected area 2 times daily 10/19/18   Ayden Apodaca C, PA-C  doxycycline (MONODOX) 100 MG capsule Take 100 mg by mouth 2 (two) times daily. 09/27/18   [provider]  famotidine (PEPCID) 20 MG tablet Take 1 tablet (20 mg total) by mouth 2 (two) times daily. 10/05/18   Jola Schmidt, MD  ibuprofen (ADVIL,MOTRIN)  200 MG tablet Take 800 mg by mouth every 6 (six) hours as needed (pain).    [provider]  ipratropium-albuterol (DUONEB) 0.5-2.5 (3) MG/3ML SOLN Take 3 mLs by nebulization every 4 (four) hours as needed. 10/13/18   Raylene Everts, MD  naproxen (NAPROSYN) 500 MG tablet Take 500 mg by mouth every 12 (twelve) hours as needed for mild pain.  09/26/18   [provider]  predniSONE (DELTASONE) 20 MG tablet Take 2 tablets (40 mg total) by mouth daily with breakfast for 5 days. 10/15/18 10/20/18  Shirley, [place], DO  RETIN-A 0.025 % cream Apply 1 application topically See admin instructions. Apply a pea-sized amount to the face nightly 09/27/18   [provider]  triamcinolone cream (KENALOG) 0.1 % Apply 1 application topically 2 (two) times daily. 10/19/18   Malaijah Houchen, Elesa Hacker, PA-C    Family History Family History  Problem Relation Age of Onset  . Breast cancer Paternal Grandmother   . Colon cancer Paternal Grandfather   . Colon cancer Maternal Grandfather   . Colon polyps Maternal Aunt   . Diabetes Mother   . Anesthesia problems Mother        hard to wake up post-op  . Diabetes Sister   . Anesthesia problems Sister        hard to wake up post-op  . Diabetes Father   . Heart disease Father     Social History Social History   Tobacco Use  . Smoking status: Former Smoker    Packs/day: 0.00    Years: 14.00    Pack years: 0.00    Types: Cigarettes    Last attempt to quit: 03/17/2017    Years since quitting: 1.5  . Smokeless tobacco: Never Used  Substance Use Topics  . Alcohol use: No  . Drug use: No     Allergies   Adhesive [tape]; Bee venom; and Oatmeal   Review of Systems Review of Systems  Constitutional: Negative for fatigue and fever.  Eyes: Negative for visual disturbance.  Respiratory: Negative for shortness of breath.   Cardiovascular: Negative for chest pain.  Gastrointestinal: Negative for abdominal pain, nausea and vomiting.   Musculoskeletal: Negative for arthralgias and joint swelling.  Skin: Positive for color change and rash. Negative for wound.  Neurological: Negative for dizziness, weakness, light-headedness and headaches.     Physical Exam Triage Vital Signs ED Triage Vitals [10/19/18 1052]  Enc Vitals Group     BP 112/78     Pulse Rate 99     Resp 18     Temp 98.3 F (36.8 C)     Temp Source Oral  SpO2 98 %     Weight      Height      Head Circumference      Peak Flow      Pain Score 2     Pain Loc      Pain Edu?      Excl. in [place]?    No data found.  Updated Vital Signs BP 112/78 (BP Location: Right Arm)   Pulse 99   Temp 98.3 F (36.8 C) (Oral)   Resp 18   LMP 08/14/2016 (Exact Date)   SpO2 98%   Visual Acuity Right Eye Distance:   Left Eye Distance:   Bilateral Distance:    Right Eye Near:   Left Eye Near:    Bilateral Near:     Physical Exam Vitals signs and nursing note reviewed.  Constitutional:      Appearance: She is well-developed.     Comments: No acute distress  HENT:     Head: Normocephalic and atraumatic.     Nose: Nose normal.  Eyes:     Conjunctiva/sclera: Conjunctivae normal.  Neck:     Musculoskeletal: Neck supple.  Cardiovascular:     Rate and Rhythm: Normal rate.  Pulmonary:     Effort: Pulmonary effort is normal. No respiratory distress.  Abdominal:     General: There is no distension.  Musculoskeletal: Normal range of motion.  Skin:    General: Skin is warm and dry.     Comments: Black tattoo to left flank, no surrounding erythema or overlying skin peeling, various areas of tattoo with very faint papular bumps, slightly raised, no overlying scaling  Neurological:     Mental Status: She is alert and oriented to person, place, and time.      UC Treatments / Results  Labs (all labs ordered are listed, but only abnormal results are displayed) Labs Reviewed - No data to display  EKG None  Radiology No results found.  Procedures  Procedures (including critical care time)  Medications Ordered in UC Medications - No data to display  Initial Impression / Assessment and Plan / UC Course  I have reviewed the triage vital signs and the nursing notes.  Pertinent labs & imaging results that were available during my care of the patient were reviewed by me and considered in my medical decision making (see chart for details).     Likely hypersensitivity/skin irritation, possible fungal given itchiness.  Will treat with triamcinolone and Lotrimin.  Continue to monitor.  Did advise to try steroid cream in small area first and do not use more than twice daily to avoid any changes to tattoo.  No sign of infection at this time.  Discussed strict return precautions. Patient verbalized understanding and is agreeable with plan.  Final Clinical Impressions(s) / UC Diagnoses   Final diagnoses:  Tattoo reaction     Discharge Instructions     Please apply both creams twice daily Lotrimin is a fungal cream Triamcinolone is a steroid cream that will likely help calm irritation associated with this- please use thin amount no more than twice daily   ED Prescriptions    Medication Sig Dispense Auth. Provider   triamcinolone cream (KENALOG) 0.1 % Apply 1 application topically 2 (two) times daily. 30 g Julio Zappia C, PA-C   clotrimazole (LOTRIMIN) 1 % cream Apply to affected area 2 times daily 15 g Tanaja Ganger, [place] C, PA-C     Controlled Substance Prescriptions [place] Controlled Substance Registry consulted? Not Applicable  Janith Lima, PA-C 10/19/18 1126

## 2018-10-19 NOTE — Discharge Instructions (Signed)
Please apply both creams twice daily Lotrimin is a fungal cream Triamcinolone is a steroid cream that will likely help calm irritation associated with this- please use thin amount no more than twice daily

## 2018-10-19 NOTE — ED Triage Notes (Signed)
Pt sts itchy area around tattoo

## 2018-10-28 ENCOUNTER — Other Ambulatory Visit: Payer: Self-pay

## 2018-10-28 ENCOUNTER — Telehealth (INDEPENDENT_AMBULATORY_CARE_PROVIDER_SITE_OTHER): Payer: Medicaid Other | Admitting: Neurology

## 2018-10-28 DIAGNOSIS — G43009 Migraine without aura, not intractable, without status migrainosus: Secondary | ICD-10-CM

## 2018-10-28 NOTE — Progress Notes (Signed)
Virtual Visit via Video Note The purpose of this virtual visit is to provide medical care while limiting exposure to the novel coronavirus.    Consent was obtained for video visit:  Yes.   Answered questions that patient had about telehealth interaction:  Yes.   I discussed the limitations, risks, security and privacy concerns of performing an evaluation and management service by telemedicine. I also discussed with the patient that there may be a patient responsible charge related to this service. The patient expressed understanding and agreed to proceed.  Pt location: Home Physician Location: office Name of referring provider:  Rory Percy, DO I connected with Michelle [name] at patients initiation/request on 10/28/2018 at  1:00 PM EDT by video enabled telemedicine application and verified that I am speaking with the correct person using two identifiers. Pt MRN:  [id number] Pt DOB:  01/07/1991 Video Participants:  Michelle [name]   History of Present Illness:  The patient was last seen in February 2019 for migraines. She had good response to nortriptyline 75mg  qhs. She was lost to follow-up and apparently discontinued the nortriptyline since she was doing well. Records were reviewed. She was in the ER in January 2020 for migraine and was given a prescription for amitriptyline 75mg  qhs. She was back in the ER last month again for a migraine cocktail, but states that she then started having right ear then jaw pain, and after treatment of TMJ dysfunction, her headache went away. She states her migraines are very well-controlled, she has them every 6 months or so off medication. She denies any dizziness, vision changes, focal numbness/tingling/weakness, no falls. Sleep is okay, she feels rested with no daytime drowsiness. Mood is good.  History on Initial Assessment 03/06/2016: This is a 28yo RH woman with a history of bipolar disorder, PTSD, hypertension, who presented with new onset headaches  since June 2016. 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 They were so bad that she was shaking on the floor and felt like she would pass out. She went to the ER on 01/06/16 and reports having daily headaches that wax and wane in intensity since then. She would wake up with a headache then towards the evening they become more severe 10/10 with throbbing pain and nausea. She occasionally sees black dots in her vision. No positional component. She stopped Depo-Provera last week. No photo/phonophobia, tinnitus, focal numbness/tingling/weakness, dizziness, diplopia, dysarthria, dysphagia, bowel/bladder dysfunction. She has had neck pains as well. She has chronic back pain.   She has been dealing with significant stomach cramps and will be seeing a specialist regarding concern for endometriosis. She has been taking Tramadol and Percocet daily for the cramps, and has prn Fioricet for the headaches, taking 2-3 daily for the past 2 months. She gets 8-9 hours of sleep but still feels exhausted (for the past couple of years). She denies snoring, she has daytime drowsiness. She was given Reglan as well in the ER and takes this daily. There is a history of one seizure during delivery of her third child in 2013. No family history of migraines.    Past Medical History:  Diagnosis Date  . Anxiety   . Asthma   . Bipolar disorder ([place])   . Depression   . Family history of adverse reaction to anesthesia   . Gestational diabetes   .  History of seizure 06/20/2012  . Hypertension   . Lipoma of lower back 08/2015  . PTSD (post-traumatic stress disorder)   . PTSD (post-traumatic stress disorder)   . Seizures ([place])   . Tachycardia   . TMJ (dislocation of temporomandibular joint)   . Vertigo     Observations/Objective:   Patient is awake, alert, oriented x  3. No aphasia or dysarthria. Intact fluency and comprehension. Remote and recent memory intact. Able to name and repeat. Cranial nerves: pupils equal, round. Extraocular movements intact with no nystagmus. No facial asymmetry. Motor: moves all extremities symmetrically. No incoordination on finger to nose testing. Gait: narrow-based and steady, able to tandem walk adequately. Negative Romberg test.  Assessment and Plan:   This is a 28 yo RH woman with a history of bipolar disorder, PTSD, hypertension, who presented in 2017 for new onset daily headaches with migranous features. MRI brain normal. She had a good response to nortriptyline 75mg  qhs and stopped medication since she was doing well for a year. She continues to report good control of migraines off medication. She know to call our office for any change in symptoms and will follow-up prn.   Follow Up Instructions:   -I discussed the assessment and treatment plan with the patient. The patient was provided an opportunity to ask questions and all were answered. The patient agreed with the plan and demonstrated an understanding of the instructions.   The patient was advised to call back or seek an in-person evaluation if the symptoms worsen or if the condition fails to improve as anticipated.   Cameron Sprang, MD

## 2018-11-04 ENCOUNTER — Other Ambulatory Visit: Payer: Self-pay

## 2018-11-04 ENCOUNTER — Ambulatory Visit (INDEPENDENT_AMBULATORY_CARE_PROVIDER_SITE_OTHER): Payer: Medicaid Other | Admitting: Allergy

## 2018-11-04 ENCOUNTER — Encounter: Payer: Self-pay | Admitting: Allergy

## 2018-11-04 VITALS — BP 118/88 | HR 98 | Temp 96.6°F | Resp 18 | Ht 62.0 in | Wt 146.0 lb

## 2018-11-04 DIAGNOSIS — T7840XA Allergy, unspecified, initial encounter: Secondary | ICD-10-CM | POA: Insufficient documentation

## 2018-11-04 DIAGNOSIS — J4520 Mild intermittent asthma, uncomplicated: Secondary | ICD-10-CM | POA: Diagnosis not present

## 2018-11-04 DIAGNOSIS — J310 Chronic rhinitis: Secondary | ICD-10-CM

## 2018-11-04 DIAGNOSIS — Z91030 Bee allergy status: Secondary | ICD-10-CM | POA: Diagnosis not present

## 2018-11-04 DIAGNOSIS — T7840XD Allergy, unspecified, subsequent encounter: Secondary | ICD-10-CM | POA: Diagnosis not present

## 2018-11-04 HISTORY — DX: Chronic rhinitis: J31.0

## 2018-11-04 HISTORY — DX: Allergy, unspecified, initial encounter: T78.40XA

## 2018-11-04 MED ORDER — EPINEPHRINE 0.3 MG/0.3ML IJ SOAJ: 0.3000 mg | Freq: Once | INTRAMUSCULAR | 2 refills | Status: DC | PRN

## 2018-11-04 NOTE — Progress Notes (Signed)
New Patient Note  RE: [name] MRN: [id number] DOB: Aug 29, 1990 Date of Office Visit: 11/04/2018  Referring provider: McDiarmid, Blane Ohara, MD Primary care provider: Rory Percy, DO  Chief Complaint: Urticaria  History of Present Illness: I had the pleasure of seeing Michelle [name] for initial evaluation at the [facility] on 11/06/2018. She is a 28 y.o. female, who is referred here by Rory Percy, DO for the evaluation of urticaria.    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 The reaction occurred 1 year ago, after she ate 6 pieces of oatmeal cookie which was premade. Symptoms started within 1 hours and was in the form of rash/hives on the legs. Denies any swelling, wheezing, abdominal pain, diarrhea, vomiting. Denies any associated cofactors such as exertion, infection, NSAID use, or alcohol consumption. The symptoms lasted for 3 days. She was evaluated in UC and received steroid injections which helped. Since this episode, she does not report other accidental exposures to oatmeal cookies. She does not have access to epinephrine autoinjector.   Most recent reaction was on Easter and noticed about 1 month ago difficulty breathing after eating eggs. Symptoms appeared within 30-60 minutes. Symptoms lasted for 1 whole day. On Easter she broke out in welts after eating eggs within 10 minutes. Patient took benadryl and symptoms resolved within a few hours.   Past work up includes: none.  Dietary History: patient has been eating other foods including milk, peanut, treenuts - walnuts, sesame, shellfish, seafood, soy, wheat, meats, fruits and vegetables.  She reports reading labels and avoiding oatmeal and eggs in diet completely.   Assessment and Plan: Michelle [name] is a 28 y.o. female with: Allergic reaction Reaction to premade oatmeal cookies 1 year ago in the form of rash/hives. Patient used to tolerate these cookies with no issues. Then in  March/April noticed difficulty breathing after eating eggs and on Easter had a hive outbreak after eating an egg. Currently avoiding oatmeal and eggs.   Today's skin testing was negative to foods. The patients history suggests oatmeal and egg allergy, though todays skin tests were negative despite a positive histamine control.  Food allergen skin testing has excellent negative predictive value however there is still a 5% chance that the allergy exists. Therefore, we will investigate further with serum specific IgE levels and, if negative then schedule for open graded oral food challenge. A laboratory order form has been provided for serum specific IgE against basic foods and oat. Will also get a few other bloodwork to rule out any other possible underlying causes.  Until the food allergy has been definitively ruled out, the patient is to continue meticulous avoidance of oatmeal and eggs.  I have prescribed epinephrine injectable and demonstrated proper use. For mild symptoms you can take over the counter antihistamines such as Benadryl and monitor symptoms closely. If symptoms worsen or if you have severe symptoms including breathing issues, throat closure, significant swelling, whole body hives, severe diarrhea and vomiting, lightheadedness then inject epinephrine and seek immediate medical care afterwards.  Food action plan given.   Mild intermittent asthma without complication Diagnosed with asthma over 10 years ago and currently has albuterol which helps. Using albuterol once a week.   Today's spirometry was normal.  May use albuterol rescue inhaler 2 puffs or nebulizer every 4 to 6 hours as needed for shortness of breath, chest tightness, coughing, and wheezing. May use albuterol rescue inhaler 2 puffs 5 to 15 minutes prior to strenuous  physical activities. Monitor frequency of use.   Chronic rhinitis Rhino conjunctivitis symptoms for the past 10+ years during the spring only. Using  Claritin prn with good benefit.   Today's skin testing was negative to environmental allergies.  Will double check results with bloodwork and make additional recommendations based on results.   May use over the counter antihistamines such as Zyrtec (cetirizine), Claritin (loratadine), Allegra (fexofenadine), or Xyzal (levocetirizine) daily as needed.  Bee sting allergy Stung by an insect 6 months ago and had hives for 1 week. Needed steroid injection.  Get bloodwork as below.  I have prescribed epinephrine injectable and demonstrated proper use. For mild symptoms you can take over the counter antihistamines such as Benadryl and monitor symptoms closely. If symptoms worsen or if you have severe symptoms including breathing issues, throat closure, significant swelling, whole body hives, severe diarrhea and vomiting, lightheadedness then inject epinephrine and seek immediate medical care afterwards.  Return in about 2 months (around 01/04/2019).  Meds ordered this encounter  Medications  . EPINEPHrine (EPIPEN 2-PAK) 0.3 mg/0.3 mL IJ SOAJ injection    Sig: Inject 0.3 mLs (0.3 mg total) into the muscle once as needed for up to 1 dose for anaphylaxis.    Dispense:  2 Device    Refill:  2    Lab Orders     Allergen Hymenoptera Panel     Allergen Profile, Basic Food     Allergen,Oat,f7     Allergens, Zone 2     Alpha-Gal Panel     ANA w/Reflex     C3 and C4     CBC With Differential     Comprehensive metabolic panel     Sed Rate (ESR)     Thyroid Cascade Profile     Tryptase     Specimen status report  Other allergy screening: Asthma: yes  She reports symptoms of shortness of breath, wheezing for 10 years. Current medications include albuterol prn which help. She reports not using aerochamber with asthma inhalers. She tried the following inhalers: none. Main asthma triggers are eggs? In the last month, frequency of asthma symptoms: 1x/week. Frequency of nocturnal symptoms: 0x/month.  Frequency of SABA use: 1x/week. Interference with physical activity: sometimes. Sleep is undisturbed. In the last 12 months, emergency room visits/urgent care visits/doctor office visits or hospitalizations due to asthma: twice. In the last 12 months, oral steroids courses: 1. Lifetime history of hospitalization for asthma: 0. Prior intubations: no. History of pneumonia: 3 years ago. She was not evaluated by allergist/pulmonologist in the past. Smoking exposure: quit in 2018 - 1 ppd x 10 yrs. Up to date with flu vaccine: yes.   Rhino conjunctivitis: yes  She reports symptoms of rhinorrhea, sneezing, itchy eyes. Symptoms have been going on for 10+ years. The symptoms are present during the spring. She has used Claritin with fair improvement in symptoms. Sinus infections: no. Previous work up includes: no. Medication allergy: no Hymenoptera allergy:  6 months ago got stung by an insect and developed hives for about 1 week. She got steroid injection with good benefit. Eczema:yes History of recurrent infections suggestive of immunodeficency: no  Diagnostics: Spirometry:  Tracings reviewed. Her effort: Good reproducible efforts. FVC: 3.80 L FEV1: 3.34 L, 111 % predicted FEV1/FVC ratio: 88 % Interpretation: Spirometry consistent with normal pattern.  Please see scanned spirometry results for details.  Skin Testing: Environmental allergy panel and select foods. Negative test to: Environmental allergy panel and foods including oatmeal.  Results discussed with patient/family.  Airborne Adult Perc - 11/04/18 1437    Time Antigen Placed  1437    Allergen Manufacturer  Lavella Hammock    Location  Back    Number of Test  59    Panel 1  Select    1. Control-Buffer 50% Glycerol  Negative    2. Control-Histamine 1 mg/ml  4+    3. Albumin saline  Negative    4. Hilltop  Negative    5. [place]  Negative    6. Johnson  Negative    7. Arboles Blue  Negative    8. Meadow Fescue  Negative    9. Perennial Rye   Negative    10. Sweet Vernal  Negative    11. Timothy  Negative    12. Cocklebur  Negative    13. Burweed Marshelder  Negative    14. Ragweed, short  Negative    15. Ragweed, Giant  Negative    16. Plantain,  English  Negative    17. Lamb's Quarters  Negative    18. Sheep Sorrell  Negative    19. Rough Pigweed  Negative    20. Marsh Elder, Rough  Negative    21. Mugwort, Common  Negative    22. Ash mix  Negative    23. Birch mix  Negative    24. Beech American  Negative    25. Box, Elder  Negative    26. Cedar, red  Negative    27. Cottonwood, [place]  Negative    28. Elm mix  Negative    29. Hickory mix  Negative    30. Maple mix  Negative    31. Oak, [place] mix  Negative    32. Pecan Pollen  Negative    33. Pine mix  Negative    34. Sycamore Eastern  Negative    35. Faison, Black Pollen  Negative    36. Alternaria alternata  Negative    37. Cladosporium Herbarum  Negative    38. Aspergillus mix  Negative    39. Penicillium mix  Negative    40. Bipolaris sorokiniana (Helminthosporium)  Negative    41. Drechslera spicifera (Curvularia)  Negative    42. Mucor plumbeus  Negative    43. Fusarium moniliforme  Negative    44. Aureobasidium pullulans (pullulara)  Negative    45. Rhizopus oryzae  Negative    46. Botrytis cinera  Negative    47. Epicoccum nigrum  Negative    48. Phoma betae  Negative    49. Candida Albicans  Negative    50. Trichophyton mentagrophytes  Negative    51. Mite, D Farinae  5,000 AU/ml  Negative    52. Mite, D Pteronyssinus  5,000 AU/ml  Negative    53. Cat Hair 10,000 BAU/ml  Negative    54.  Dog Epithelia  Negative    55. Mixed Feathers  Negative    56. Horse Epithelia  Negative    57. Cockroach, German  Negative    58. Mouse  Negative    59. Tobacco Leaf  Negative     Food Perc - 11/04/18 1443    Time Antigen Placed  1443    Allergen Manufacturer  Lavella Hammock    Location  Back    Number of allergen test  10    Food  Select    1. Peanut   Negative    2. Soybean food  Negative    3. Wheat, whole  Negative  4. Sesame  Negative    5. Milk, cow  Negative    6. Egg White, chicken  Negative    7. Casein  Negative    8. Shellfish mix  Negative    9. Fish mix  Negative    10. Cashew  Negative     Food Adult Perc - 11/04/18 1400    Time Antigen Placed  1442    Allergen Manufacturer  Lavella Hammock    Location  Back    Number of allergen test  2    Panel 2  Select    Control-Histamine 1 mg/ml  4+    31. Oat   Negative       Past Medical History: Patient Active Problem List   Diagnosis Date Noted  . Bee sting allergy 11/06/2018  . Allergic reaction 11/04/2018  . Mild intermittent asthma without complication 83/15/1761  . Chronic rhinitis 11/04/2018  . Shortness of breath 10/15/2018  . Hives 10/04/2018  . Rash and nonspecific skin eruption 11/16/2017  . Easy bruising 11/16/2017  . Anxiety state 10/27/2016  . Migraine 02/10/2016  . PTSD 03/02/2010  . 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 Anxiety   . Asthma   . Bipolar disorder ([place])    no current med.  . Depression    no current med.  . Family history of adverse reaction to anesthesia    states mother and sister are hard to wake up post-op  . Gestational diabetes   . History of seizure 06/20/2012   x 1 - during delivery of child(eclampsia)  . Hypertension   . Lipoma of lower back 08/2015  . Migraine   . PTSD (post-traumatic stress disorder)   . PTSD (post-traumatic stress disorder)   . Seizures ([place])   . Tachycardia   . TMJ (dislocation of temporomandibular joint)   . Vertigo    Past Surgical History: Past Surgical History:  Procedure Laterality Date  . ABDOMINAL HYSTERECTOMY    . ADENOIDECTOMY, TONSILLECTOMY AND MYRINGOTOMY WITH TUBE PLACEMENT  1990's  . APPENDECTOMY    . CESAREAN SECTION  06/20/2012   Procedure: CESAREAN SECTION;  Surgeon: Emily Filbert, MD;  Location: [facility];  Service: Obstetrics;   Laterality: N/A;  . LAPAROSCOPIC APPENDECTOMY  07/25/2012   Procedure: APPENDECTOMY LAPAROSCOPIC;  Surgeon: Zenovia Jarred, MD;  Location: [facility];  Service: General;  Laterality: N/A;  . LAPAROSCOPIC TUBAL LIGATION Bilateral 08/04/2014   Procedure: BILATERAL LAPAROSCOPIC TUBAL LIGATION;  Surgeon: Woodroe Mode, MD;  Location: [facility];  Service: Gynecology;  Laterality: Bilateral;  . LIPOMA EXCISION N/A 09/16/2015   Procedure: EXCISION LIPOMA LUMBAR REGION;  Surgeon: Donnie Mesa, MD;  Location: [facility];  Service: General;  Laterality: N/A;  . TONSILLECTOMY    . WISDOM TOOTH EXTRACTION     Medication List:  Current Outpatient Medications  Medication Sig Dispense Refill  . albuterol (PROAIR HFA) 108 (90 Base) MCG/ACT inhaler Inhale 2 puffs into the lungs every 6 (six) hours as needed for wheezing or shortness of breath.    . beclomethasone (QVAR REDIHALER) 80 MCG/ACT inhaler Inhale 2 puffs into the lungs 2 (two) times daily. 1 Inhaler 12  . Cetirizine HCl (ZYRTEC PO) Take by mouth.    . clindamycin-benzoyl peroxide (BENZACLIN) gel Apply 1 application topically at bedtime.     . clotrimazole (LOTRIMIN) 1 % cream Apply to affected area 2 times daily 15 g 0  .  diphenhydrAMINE HCl (BENADRYL ALLERGY PO) Take by mouth.    . doxycycline (MONODOX) 100 MG capsule Take 100 mg by mouth 2 (two) times daily.    . famotidine (PEPCID) 20 MG tablet Take 1 tablet (20 mg total) by mouth 2 (two) times daily. 10 tablet 0  . ibuprofen (ADVIL,MOTRIN) 200 MG tablet Take 800 mg by mouth every 6 (six) hours as needed (pain).    Marland Kitchen ipratropium-albuterol (DUONEB) 0.5-2.5 (3) MG/3ML SOLN Take 3 mLs by nebulization every 4 (four) hours as needed. 360 mL 0  . RETIN-A 0.025 % cream Apply 1 application topically See admin instructions. Apply a pea-sized amount to the face nightly    . triamcinolone cream (KENALOG) 0.1 % Apply 1 application topically 2 (two) times daily. 30 g 0  . EPINEPHrine (EPIPEN 2-PAK) 0.3  mg/0.3 mL IJ SOAJ injection Inject 0.3 mLs (0.3 mg total) into the muscle once as needed for up to 1 dose for anaphylaxis. 2 Device 2   No current facility-administered medications for this visit.    Allergies: Allergies  Allergen Reactions  . Adhesive [Tape] Hives and Other (See Comments)    EKG or heart monitor pads- caused raised, red areas (WELTS) on the skin; "sensitive" pads fell off  . Bee Venom Hives and Other (See Comments)    Welts, also  . Oatmeal Hives    Hives from the waist down and had to seek medical treatment   Social History: Social History   Socioeconomic History  . Marital status: Single    Spouse name: Not on file  . Number of children: 2  . Years of education: Not on file  . Highest education level: Not on file  Occupational History  . Not on file  Social Needs  . Financial resource strain: Not on file  . Food insecurity:    Worry: Not on file    Inability: Not on file  . Transportation needs:    Medical: Not on file    Non-medical: Not on file  Tobacco Use  . Smoking status: Former Smoker    Packs/day: 0.00    Years: 14.00    Pack years: 0.00    Types: Cigarettes    Last attempt to quit: 03/17/2017    Years since quitting: 1.6  . Smokeless tobacco: Never Used  Substance and Sexual Activity  . Alcohol use: No  . Drug use: No  . Sexual activity: Not on file  Lifestyle  . Physical activity:    Days per week: Not on file    Minutes per session: Not on file  . Stress: Not on file  Relationships  . Social connections:    Talks on phone: Not on file    Gets together: Not on file    Attends religious service: Not on file    Active member of club or organization: Not on file    Attends meetings of clubs or organizations: Not on file    Relationship status: Not on file  Other Topics Concern  . Not on file  Social History Narrative   Lives with boyfriend and 3 children in a one story home.  Does not work.  Education: 9th grade.   Lives in a  house. Smoking: quit in 2018, 1 ppd x 10 years Occupation: not employed  Environmental HistoryFreight forwarder in the house: no Charity fundraiser in the family room: yes Carpet in the bedroom: yes Heating: electric Cooling: central Pet: no  Family History: Family History  Problem Relation  Age of Onset  . Breast cancer Paternal Grandmother   . Colon cancer Paternal Grandfather   . Colon cancer Maternal Grandfather   . Colon polyps Maternal Aunt   . Diabetes Mother   . Anesthesia problems Mother        hard to wake up post-op  . Diabetes Sister   . Anesthesia problems Sister        hard to wake up post-op  . Diabetes Father   . Heart disease Father   . Eczema Neg Hx   . Allergic rhinitis Neg Hx   . Asthma Neg Hx    Review of Systems  Constitutional: Negative for appetite change, chills, fever and unexpected weight change.  HENT: Positive for rhinorrhea and sneezing. Negative for congestion.   Eyes: Positive for itching.  Respiratory: Negative for cough, chest tightness, shortness of breath and wheezing.   Cardiovascular: Negative for chest pain.  Gastrointestinal: Negative for abdominal pain.  Genitourinary: Negative for difficulty urinating.  Skin: Positive for rash.  Neurological: Negative for headaches.   Objective: BP 118/88 (BP Location: Left Arm, Patient Position: Sitting, Cuff Size: Normal)   Pulse 98   Temp (!) 96.6 F (35.9 C) (Tympanic)   Resp 18   Ht _0  (1.575 m)   Wt 146 lb (66.2 kg)   LMP 08/14/2016 (Exact Date)   SpO2 98%   BMI 26.70 kg/m  Body mass index is 26.7 kg/m. Physical Exam  Constitutional: She is oriented to person, place, and time. She appears well-developed and well-nourished.  HENT:  Head: Normocephalic and atraumatic.  Right Ear: External ear normal.  Left Ear: External ear normal.  Nose: Nose normal.  Mouth/Throat: Oropharynx is clear and moist.  Eyes: Conjunctivae and EOM are normal.  Neck: Neck supple.  Cardiovascular: Normal  rate, regular rhythm and normal heart sounds. Exam reveals no gallop and no friction rub.  No murmur heard. Pulmonary/Chest: Effort normal and breath sounds normal. She has no wheezes. She has no rales.  Abdominal: Soft.  Neurological: She is alert and oriented to person, place, and time.  Skin: Skin is warm. No rash noted.  Psychiatric: She has a normal mood and affect. Her behavior is normal.  Nursing note and vitals reviewed.  The plan was reviewed with the patient/family, and all questions/concerned were addressed.  It was my pleasure to see Michelle [name] today and participate in her care. Please feel free to contact me with any questions or concerns.  Sincerely,  Rexene Alberts, DO Allergy & Immunology  [facility] office: [phone] [facility] office: [phone]

## 2018-11-04 NOTE — Patient Instructions (Addendum)
Today's skin testing was negative to foods including oatmeal and environmental allergies. Not sure what's causing these reactions  Recommend that you continue to avoid oatmeal and eggs for now. I have prescribed epinephrine injectable and demonstrated proper use. For mild symptoms you can take over the counter antihistamines such as Benadryl and monitor symptoms closely. If symptoms worsen or if you have severe symptoms including breathing issues, throat closure, significant swelling, whole body hives, severe diarrhea and vomiting, lightheadedness then inject epinephrine and seek immediate medical care afterwards. Food action plan given.   Get bloodwork -  CBC diff, ESR, CMP, C3, C4, tryptase, ANA with reflex, thyroid cascade, alpha gal, venom panel, Ige with zone 2, basic foods, oat   Keep track of reactions and take pictures.   May use albuterol rescue inhaler 2 puffs or nebulizer every 4 to 6 hours as needed for shortness of breath, chest tightness, coughing, and wheezing. May use albuterol rescue inhaler 2 puffs 5 to 15 minutes prior to strenuous physical activities. Monitor frequency of use.   May use over the counter antihistamines such as Zyrtec (cetirizine), Claritin (loratadine), Allegra (fexofenadine), or Xyzal (levocetirizine) daily as needed.  Follow up in 2 months   Skin care recommendations  Bath time: . Always use lukewarm water. AVOID very hot or cold water. Marland Kitchen Keep bathing time to 5-10 minutes. . Do NOT use bubble bath. . Use a mild soap and use just enough to wash the dirty areas. . Do NOT scrub skin vigorously.  . After bathing, pat dry your skin with a towel. Do NOT rub or scrub the skin.  Moisturizers and prescriptions:  . ALWAYS apply moisturizers immediately after bathing (within 3 minutes). This helps to lock-in moisture. . Use the moisturizer several times a day over the whole body. Kermit Balo summer moisturizers include: Aveeno, CeraVe, Cetaphil. Kermit Balo winter  moisturizers include: Aquaphor, Vaseline, Cerave, Cetaphil, Eucerin, Vanicream. . When using moisturizers along with medications, the moisturizer should be applied about one hour after applying the medication to prevent diluting effect of the medication or moisturize around where you applied the medications. When not using medications, the moisturizer can be continued twice daily as maintenance.  Laundry and clothing: . Avoid laundry products with added color or perfumes. . Use unscented hypo-allergenic laundry products such as Tide free, Cheer free & gentle, and All free and clear.  . If the skin still seems dry or sensitive, you can try double-rinsing the clothes. . Avoid tight or scratchy clothing such as wool. . Do not use fabric softeners or dyer sheets.

## 2018-11-06 DIAGNOSIS — Z91030 Bee allergy status: Secondary | ICD-10-CM | POA: Insufficient documentation

## 2018-11-06 HISTORY — DX: Bee allergy status: Z91.030

## 2018-11-06 NOTE — Assessment & Plan Note (Addendum)
Reaction to premade oatmeal cookies 1 year ago in the form of rash/hives. Patient used to tolerate these cookies with no issues. Then in March/April noticed difficulty breathing after eating eggs and on Easter had a hive outbreak after eating an egg. Currently avoiding oatmeal and eggs.   Today's skin testing was negative to foods. The patients history suggests oatmeal and egg allergy, though todays skin tests were negative despite a positive histamine control.  Food allergen skin testing has excellent negative predictive value however there is still a 5% chance that the allergy exists. Therefore, we will investigate further with serum specific IgE levels and, if negative then schedule for open graded oral food challenge. A laboratory order form has been provided for serum specific IgE against basic foods and oat. Will also get a few other bloodwork to rule out any other possible underlying causes.  Until the food allergy has been definitively ruled out, the patient is to continue meticulous avoidance of oatmeal and eggs.  I have prescribed epinephrine injectable and demonstrated proper use. For mild symptoms you can take over the counter antihistamines such as Benadryl and monitor symptoms closely. If symptoms worsen or if you have severe symptoms including breathing issues, throat closure, significant swelling, whole body hives, severe diarrhea and vomiting, lightheadedness then inject epinephrine and seek immediate medical care afterwards.  Food action plan given.

## 2018-11-06 NOTE — Assessment & Plan Note (Signed)
Stung by an insect 6 months ago and had hives for 1 week. Needed steroid injection.  Get bloodwork as below.  I have prescribed epinephrine injectable and demonstrated proper use. For mild symptoms you can take over the counter antihistamines such as Benadryl and monitor symptoms closely. If symptoms worsen or if you have severe symptoms including breathing issues, throat closure, significant swelling, whole body hives, severe diarrhea and vomiting, lightheadedness then inject epinephrine and seek immediate medical care afterwards.

## 2018-11-06 NOTE — Assessment & Plan Note (Signed)
Diagnosed with asthma over 10 years ago and currently has albuterol which helps. Using albuterol once a week.   Today's spirometry was normal.  May use albuterol rescue inhaler 2 puffs or nebulizer every 4 to 6 hours as needed for shortness of breath, chest tightness, coughing, and wheezing. May use albuterol rescue inhaler 2 puffs 5 to 15 minutes prior to strenuous physical activities. Monitor frequency of use.

## 2018-11-06 NOTE — Assessment & Plan Note (Signed)
Rhino conjunctivitis symptoms for the past 10+ years during the spring only. Using Claritin prn with good benefit.   Today's skin testing was negative to environmental allergies.  Will double check results with bloodwork and make additional recommendations based on results.   May use over the counter antihistamines such as Zyrtec (cetirizine), Claritin (loratadine), Allegra (fexofenadine), or Xyzal (levocetirizine) daily as needed.

## 2018-11-07 ENCOUNTER — Encounter: Payer: Self-pay | Admitting: Allergy

## 2018-11-07 DIAGNOSIS — T7840XD Allergy, unspecified, subsequent encounter: Secondary | ICD-10-CM

## 2018-11-07 LAB — ALPHA-GAL PANEL
Alpha Gal IgE*: <0.1 kU/L (ref <0.10)
Beef (Bos spp) IgE: <0.1 kU/L (ref <0.35)
Class Interpretation: 0
Class Interpretation: 0
Class Interpretation: 0
Lamb/Mutton (Ovis spp) IgE: <0.1 kU/L (ref <0.35)
Pork (Sus spp) IgE: <0.1 kU/L (ref <0.35)

## 2018-11-07 LAB — COMPREHENSIVE METABOLIC PANEL
ALT: 14 IU/L (ref 0–32)
AST: 15 IU/L (ref 0–40)
Albumin/Globulin Ratio: 2 (ref 1.2–2.2)
Albumin: 4.9 g/dL (ref 3.9–5.0)
Alkaline Phosphatase: 59 IU/L (ref 39–117)
BUN/Creatinine Ratio: 11 (ref 9–23)
BUN: 8 mg/dL (ref 6–20)
Bilirubin Total: 0.3 mg/dL (ref 0.0–1.2)
CO2: 23 mmol/L (ref 20–29)
Calcium: 9.7 mg/dL (ref 8.7–10.2)
Chloride: 104 mmol/L (ref 96–106)
Creatinine, Ser: 0.71 mg/dL (ref 0.57–1.00)
GFR calc Af Amer: 133 mL/min/{1.73_m2} (ref >59)
GFR calc non Af Amer: 115 mL/min/{1.73_m2} (ref >59)
Globulin, Total: 2.4 g/dL (ref 1.5–4.5)
Glucose: 95 mg/dL (ref 65–99)
Potassium: 4.3 mmol/L (ref 3.5–5.2)
Sodium: 147 mmol/L — ABNORMAL HIGH (ref 134–144)
Total Protein: 7.3 g/dL (ref 6.0–8.5)

## 2018-11-07 LAB — ALLERGENS, ZONE 2

## 2018-11-07 LAB — ALLERGEN PROFILE, BASIC FOOD
Allergen Corn, IgE: <0.1 kU/L
Beef IgE: <0.1 kU/L
Chocolate/Cacao IgE: <0.1 kU/L
Egg, Whole IgE: <0.1 kU/L
Food Mix (Seafoods) IgE: NEGATIVE
Milk IgE: <0.1 kU/L
Peanut IgE: <0.1 kU/L
Pork IgE: <0.1 kU/L
Soybean IgE: <0.1 kU/L
Wheat IgE: <0.1 kU/L

## 2018-11-07 LAB — C3 AND C4
Complement C3, Serum: 133 mg/dL (ref 82–167)
Complement C4, Serum: 33 mg/dL (ref 14–44)

## 2018-11-07 LAB — CBC WITH DIFFERENTIAL
Basophils Absolute: 0 10*3/uL (ref 0.0–0.2)
Basos: 0 %
EOS (ABSOLUTE): 0 10*3/uL (ref 0.0–0.4)
Eos: 1 %
Hematocrit: 41.3 % (ref 34.0–46.6)
Hemoglobin: 13.7 g/dL (ref 11.1–15.9)
Immature Grans (Abs): 0 10*3/uL (ref 0.0–0.1)
Immature Granulocytes: 0 %
Lymphocytes Absolute: 3.1 10*3/uL (ref 0.7–3.1)
Lymphs: 39 %
MCH: 31.4 pg (ref 26.6–33.0)
MCHC: 33.2 g/dL (ref 31.5–35.7)
MCV: 95 fL (ref 79–97)
Monocytes Absolute: 0.5 10*3/uL (ref 0.1–0.9)
Monocytes: 7 %
Neutrophils Absolute: 4.2 10*3/uL (ref 1.4–7.0)
Neutrophils: 53 %
RBC: 4.37 x10E6/uL (ref 3.77–5.28)
RDW: 11.4 % — ABNORMAL LOW (ref 11.7–15.4)
WBC: 7.9 10*3/uL (ref 3.4–10.8)

## 2018-11-07 LAB — ALLERGEN HYMENOPTERA PANEL
Bumblebee: <0.1 kU/L
Honeybee IgE: 0.19 kU/L — AB
Hornet, White Face, IgE: <0.1 kU/L
Hornet, Yellow, IgE: <0.1 kU/L
Paper Wasp IgE: <0.1 kU/L
Yellow Jacket, IgE: <0.1 kU/L

## 2018-11-07 LAB — ALLERGEN,OAT,F7: Allergen Oat IgE: <0.1 kU/L

## 2018-11-07 LAB — TRYPTASE: Tryptase: 15.1 ug/L — ABNORMAL HIGH (ref 2.2–13.2)

## 2018-11-07 LAB — ANA W/REFLEX: Anti Nuclear Antibody (ANA): NEGATIVE

## 2018-11-07 LAB — SEDIMENTATION RATE: Sed Rate: 16 mm/hr (ref 0–32)

## 2018-11-07 LAB — THYROID CASCADE PROFILE: TSH: 0.824 u[IU]/mL (ref 0.450–4.500)

## 2018-11-07 LAB — SPECIMEN STATUS REPORT

## 2018-11-11 ENCOUNTER — Telehealth: Payer: Self-pay

## 2018-11-11 NOTE — Telephone Encounter (Signed)
I have scheduled the patient for a Scrambled Egg Challenge on 12/19/2018. I have mailed the challenge paperwork and also the patient mentioned a print out of the lab order. I have placed both in the mail.

## 2018-11-11 NOTE — Telephone Encounter (Signed)
Noted  

## 2018-11-14 DIAGNOSIS — T7840XD Allergy, unspecified, subsequent encounter: Secondary | ICD-10-CM | POA: Diagnosis not present

## 2018-11-20 LAB — C-KIT MUTATION, LIQUID TUMOR

## 2018-11-20 LAB — TRYPTASE: Tryptase: 14.8 ug/L — ABNORMAL HIGH (ref 2.2–13.2)

## 2018-11-21 ENCOUNTER — Encounter: Payer: Self-pay | Admitting: Allergy

## 2018-12-16 ENCOUNTER — Encounter: Payer: Self-pay | Admitting: Family Medicine

## 2018-12-16 ENCOUNTER — Other Ambulatory Visit: Payer: Self-pay

## 2018-12-16 ENCOUNTER — Ambulatory Visit (INDEPENDENT_AMBULATORY_CARE_PROVIDER_SITE_OTHER): Payer: Medicaid Other | Admitting: Family Medicine

## 2018-12-16 DIAGNOSIS — H6983 Other specified disorders of Eustachian tube, bilateral: Secondary | ICD-10-CM

## 2018-12-16 DIAGNOSIS — H6993 Unspecified Eustachian tube disorder, bilateral: Secondary | ICD-10-CM

## 2018-12-16 HISTORY — DX: Unspecified eustachian tube disorder, bilateral: H69.93

## 2018-12-16 MED ORDER — FLUTICASONE PROPIONATE 50 MCG/ACT NA SUSP: 1.0000 | Freq: Every day | NASAL | 1 refills | Status: DC

## 2018-12-16 NOTE — Progress Notes (Signed)
  Subjective:   Patient ID: Michelle [name]    DOB: 1991/01/19, 28 y.o. female   MRN: [id number]  Michelle [name] is a 28 y.o. female with a history of migraine, asthma, bipolar d/o I, PTSD, bee sting allergy here for   Ear pain - bilateral, started last night, noticed while watching the news after. - associated with bleeding from the R ear after she used a qtip to remove excess water.  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 Is followed by an oral Psychologist, sport and exercise.  - never seen ENT. - Medications tried: tylenol - Recent ear trauma: no - Prior ear surgeries: had bilateral tubes in 1990s - Antibiotics in the last 30 days: none - History of diabetes: not personally. Mom, dad, sister have diabetes.  Symptoms Ear discharge: no Fever: no Pain: off and on. Inhibiting sleeping Ringing in ears: yes Dizziness: no Hearing loss: yes Rashes or blisters around ear: no Weight loss: no  Review of Systems:  Per HPI.  [place], medications and smoking status reviewed.  Objective:   BP 108/70   Pulse 73   Wt 146 lb (66.2 kg)   LMP 08/14/2016 (Exact Date)   SpO2 99%   BMI 26.70 kg/m  Vitals and nursing note reviewed.  General: well nourished, well developed, in no acute distress with non-toxic appearance HEENT: normocephalic, atraumatic, moist mucous membranes. Bilateral TMs without perforation, erythema, bulging, purulence. Small amount of blood in R ear canal, not pooled or seeming to originate from inner ear. Some cerumen in L ear canal. Oropharynx clear without erythema or exudate. Neck: supple, non-tender without lymphadenopathy Skin: warm, dry, no rashes or lesions Extremities: warm and well perfused, normal tone MSK: ROM grossly intact, strength intact, gait normal Neuro: Alert and oriented, speech normal  Assessment & Plan:   Eustachian tube dysfunction, bilateral Normal exam without structural cause of  pain, no pain with tugging pinna or tragus bilaterally. Hearing grossly intact. No evidence of infection or cholesteatoma or mastoiditis. Very minimal amount of blood in canal likely related to irritation from instrumentation with qtip, especially given she had the bleeding immediately after use. Will trial nasal steroids for a few weeks, if no relief in pain or ringing in ears, will likely refer to ENT at that time.   No orders of the defined types were placed in this encounter.  Meds ordered this encounter  Medications  . fluticasone (FLONASE) 50 MCG/ACT nasal spray    Sig: Place 1 spray into both nostrils daily.    Dispense:  15.8 g    Refill:  [place], DO PGY-2, [facility] 12/16/2018 12:16 PM

## 2018-12-16 NOTE — Assessment & Plan Note (Signed)
Normal exam without structural cause of pain, no pain with tugging pinna or tragus bilaterally. Hearing grossly intact. No evidence of infection or cholesteatoma or mastoiditis. Very minimal amount of blood in canal likely related to irritation from instrumentation with qtip, especially given she had the bleeding immediately after use. Will trial nasal steroids for a few weeks, if no relief in pain or ringing in ears, will likely refer to ENT at that time.

## 2018-12-16 NOTE — Patient Instructions (Signed)
It was great to see you!  Our plans for today:  - Use flonase once daily to help with inflammation likely causing pain and inflammation. Give this a few weeks, if this doesn't help, let us know, we can consider an ENT referral at that time. - You can try a qtip with a small amount of hydrocortisone cream to help with irritation and bleeding in your ear canal. - If your hearing loss becomes more persistent, we can send you to an ENT doctor.  Take care and seek immediate care sooner if you develop any concerns.   Dr. [facility]   Eustachian Tube Dysfunction  Eustachian tube dysfunction refers to a condition in which a blockage develops in the narrow passage that connects the middle ear to the back of the nose (eustachian tube). The eustachian tube regulates air pressure in the middle ear by letting air move between the ear and nose. It also helps to drain fluid from the middle ear space. Eustachian tube dysfunction can affect one or both ears. When the eustachian tube does not function properly, air pressure, fluid, or both can build up in the middle ear. What are the causes? This condition occurs when the eustachian tube becomes blocked or cannot open normally. Common causes of this condition include:  Ear infections.  Colds and other infections that affect the nose, mouth, and throat (upper respiratory tract).  Allergies.  Irritation from cigarette smoke.  Irritation from stomach acid coming up into the esophagus (gastroesophageal reflux). The esophagus is the tube that carries food from the mouth to the stomach.  Sudden changes in air pressure, such as from descending in an airplane or scuba diving.  Abnormal growths in the nose or throat, such as: ? Growths that line the nose (nasal polyps). ? Abnormal growth of cells (tumors). ? Enlarged tissue at the back of the throat (adenoids). What increases the risk? You are more likely to develop this condition if:   You smoke.  You are overweight.  You are a child who has: ? Certain birth defects of the mouth, such as cleft palate. ? Large tonsils or adenoids. What are the signs or symptoms? Common symptoms of this condition include:  A feeling of fullness in the ear.  Ear pain.  Clicking or popping noises in the ear.  Ringing in the ear.  Hearing loss.  Loss of balance.  Dizziness. Symptoms may get worse when the air pressure around you changes, such as when you travel to an area of high elevation, fly on an airplane, or go scuba diving. How is this diagnosed? This condition may be diagnosed based on:  Your symptoms.  A physical exam of your ears, nose, and throat.  Tests, such as those that measure: ? The movement of your eardrum (tympanogram). ? Your hearing (audiometry). How is this treated? Treatment depends on the cause and severity of your condition.  In mild cases, you may relieve your symptoms by moving air into your ears. This is called "popping the ears."  In more severe cases, or if you have symptoms of fluid in your ears, treatment may include: ? Medicines to relieve congestion (decongestants). ? Medicines that treat allergies (antihistamines). ? Nasal sprays or ear drops that contain medicines that reduce swelling (steroids). ? A procedure to drain the fluid in your eardrum (myringotomy). In this procedure, a small tube is placed in the eardrum to:  Drain the fluid.  Restore the air in the middle ear space. ?  A procedure to insert a balloon device through the nose to inflate the opening of the eustachian tube (balloon dilation). Follow these instructions at home: Lifestyle  Do not do any of the following until your health care provider approves: ? Travel to high altitudes. ? Fly in airplanes. ? Work in a Pension scheme manager or room. ? Scuba dive.  Do not use any products that contain nicotine or tobacco, such as cigarettes and e-cigarettes. If you need help  quitting, ask your health care provider.  Keep your ears dry. Wear fitted earplugs during showering and bathing. Dry your ears completely after. General instructions  Take over-the-counter and prescription medicines only as told by your health care provider.  Use techniques to help pop your ears as recommended by your health care provider. These may include: ? Chewing gum. ? Yawning. ? Frequent, forceful swallowing. ? Closing your mouth, holding your nose closed, and gently blowing as if you are trying to blow air out of your nose.  Keep all follow-up visits as told by your health care provider. This is important. Contact a health care provider if:  Your symptoms do not go away after treatment.  Your symptoms come back after treatment.  You are unable to pop your ears.  You have: ? A fever. ? Pain in your ear. ? Pain in your head or neck. ? Fluid draining from your ear.  Your hearing suddenly changes.  You become very dizzy.  You lose your balance. Summary  Eustachian tube dysfunction refers to a condition in which a blockage develops in the eustachian tube.  It can be caused by ear infections, allergies, inhaled irritants, or abnormal growths in the nose or throat.  Symptoms include ear pain, hearing loss, or ringing in the ears.  Mild cases are treated with maneuvers to unblock the ears, such as yawning or ear popping.  Severe cases are treated with medicines. Surgery may also be done (rare). This information is not intended to replace advice given to you by your health care provider. Make sure you discuss any questions you have with your health care provider. Document Released: 07/30/2015 Document Revised: 10/23/2017 Document Reviewed: 10/23/2017 Elsevier Interactive Patient Education  2019 [employer].

## 2018-12-19 ENCOUNTER — Encounter: Payer: Medicaid Other | Admitting: Allergy

## 2018-12-25 ENCOUNTER — Encounter (HOSPITAL_COMMUNITY): Payer: Self-pay | Admitting: Emergency Medicine

## 2018-12-25 ENCOUNTER — Ambulatory Visit (HOSPITAL_COMMUNITY)
Admission: EM | Admit: 2018-12-25 | Discharge: 2018-12-25 | Disposition: A | Discharge status: Home / Self Care | Payer: Medicaid Other | Attending: Family Medicine | Admitting: Family Medicine

## 2018-12-25 ENCOUNTER — Other Ambulatory Visit: Payer: Self-pay

## 2018-12-25 DIAGNOSIS — R238 Other skin changes: Secondary | ICD-10-CM | POA: Diagnosis not present

## 2018-12-25 MED ORDER — IBUPROFEN 800 MG PO TABS: 800.0000 mg | ORAL_TABLET | Freq: Once | ORAL | Status: DC

## 2018-12-25 MED ORDER — IBUPROFEN 800 MG PO TABS
ORAL_TABLET | ORAL | Status: AC
  Filled 2018-12-25: qty 1

## 2018-12-25 NOTE — Discharge Instructions (Signed)
I expect this to improve in the next 24-48 hours.  Cool compresses, emollient for comfort, ibuprofen as needed.

## 2018-12-25 NOTE — ED Notes (Signed)
Patient able to ambulate independently  

## 2018-12-25 NOTE — ED Provider Notes (Signed)
[facility]    CSN: [id number] Arrival date & time: 12/25/18  1509     History   Chief Complaint Chief Complaint  Patient presents with  . eyebrow irritation    HPI Michelle [name] is a 28 y.o. female.   Michelle [name] presents with complaints of irritation to bilateral eyebrows after she had them waxed and tinted today. She typically had gotten her eyebrows done monthly, but had skipped a few months due to pandemic, this was her first re-visit. They looked and felt fine immediately after the wax but while driving noticed burning sensation which progressed and now with redness around brows. She checked with the aesthetician who confirmed no new products used. No itching. Hx oc anxiety, asthma, bipolar, depression, seizure, htn, tmj.     ROS per HPI, negative if not otherwise mentioned.      Past Medical History:  Diagnosis Date  . Anxiety   . Asthma   . Bipolar disorder ([place])    no current med.  . Depression    no current med.  . Family history of adverse reaction to anesthesia    states mother and sister are hard to wake up post-op  . Gestational diabetes   . History of seizure 06/20/2012   x 1 - during delivery of child(eclampsia)  . Hypertension   . Lipoma of lower back 08/2015  . Migraine   . PTSD (post-traumatic stress disorder)   . PTSD (post-traumatic stress disorder)   . Seizures ([place])   . Tachycardia   . TMJ (dislocation of temporomandibular joint)   . Vertigo     Patient Active Problem List   Diagnosis Date Noted  . Eustachian tube dysfunction, bilateral 12/16/2018  . Bee sting allergy 11/06/2018  . Allergic reaction 11/04/2018  . Mild intermittent asthma without complication 42/70/6237  . Chronic rhinitis 11/04/2018  . Shortness of breath 10/15/2018  . Hives 10/04/2018  . Rash and nonspecific skin eruption 11/16/2017  . Easy bruising 11/16/2017  . Anxiety state 10/27/2016  . Migraine 02/10/2016  . PTSD 03/02/2010  . 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 ABDOMINAL HYSTERECTOMY    . ADENOIDECTOMY, TONSILLECTOMY AND MYRINGOTOMY WITH TUBE PLACEMENT  1990's  . APPENDECTOMY    . CESAREAN SECTION  06/20/2012   Procedure: CESAREAN SECTION;  Surgeon: Emily Filbert, MD;  Location: [facility];  Service: Obstetrics;  Laterality: N/A;  . LAPAROSCOPIC APPENDECTOMY  07/25/2012   Procedure: APPENDECTOMY LAPAROSCOPIC;  Surgeon: Zenovia Jarred, MD;  Location: [facility];  Service: General;  Laterality: N/A;  . LAPAROSCOPIC TUBAL LIGATION Bilateral 08/04/2014   Procedure: BILATERAL LAPAROSCOPIC TUBAL LIGATION;  Surgeon: Woodroe Mode, MD;  Location: [facility];  Service: Gynecology;  Laterality: Bilateral;  . LIPOMA EXCISION N/A 09/16/2015   Procedure: EXCISION LIPOMA LUMBAR REGION;  Surgeon: Donnie Mesa, MD;  Location: [facility];  Service: General;  Laterality: N/A;  . TONSILLECTOMY    . WISDOM TOOTH EXTRACTION      OB History    Gravida  3   Para  3   Term  2   Preterm  1   AB  0   Living  3     SAB  0   TAB  0   Ectopic  0   Multiple  0   Live Births  1        Obstetric Comments  C-Section  Indication: Non-reassuring fetal tracing, growth delay & marked proteinuria & Pre-Eclampsia Eclamptic Seizure during C-section delivery         Home Medications    Prior to Admission medications   Medication Sig Start Date End Date Taking? Authorizing Provider  Cetirizine HCl (ZYRTEC PO) Take by mouth.   Yes [provider]  fluticasone (FLONASE) 50 MCG/ACT nasal spray Place 1 spray into both nostrils daily. 12/16/18  Yes Rory Percy, DO  albuterol (PROAIR HFA) 108 (90 Base) MCG/ACT inhaler Inhale 2 puffs into the lungs every 6 (six) hours as needed for wheezing or shortness of breath.    [provider]  beclomethasone (QVAR REDIHALER) 80 MCG/ACT inhaler Inhale 2 puffs into the lungs 2 (two) times daily. 10/15/18   Shirley,  [place], DO  clindamycin-benzoyl peroxide (BENZACLIN) gel Apply 1 application topically at bedtime.  09/27/18   [provider]  clotrimazole (LOTRIMIN) 1 % cream Apply to affected area 2 times daily 10/19/18   Wieters, Hallie C, PA-C  diphenhydrAMINE HCl (BENADRYL ALLERGY PO) Take by mouth.    [provider]  doxycycline (MONODOX) 100 MG capsule Take 100 mg by mouth 2 (two) times daily. 09/27/18   [provider]  EPINEPHrine (EPIPEN 2-PAK) 0.3 mg/0.3 mL IJ SOAJ injection Inject 0.3 mLs (0.3 mg total) into the muscle once as needed for up to 1 dose for anaphylaxis. 11/04/18   Garnet Sierras, DO  famotidine (PEPCID) 20 MG tablet Take 1 tablet (20 mg total) by mouth 2 (two) times daily. 10/05/18   Jola Schmidt, MD  ibuprofen (ADVIL,MOTRIN) 200 MG tablet Take 800 mg by mouth every 6 (six) hours as needed (pain).    [provider]  ipratropium-albuterol (DUONEB) 0.5-2.5 (3) MG/3ML SOLN Take 3 mLs by nebulization every 4 (four) hours as needed. 10/13/18   Raylene Everts, MD  RETIN-A 0.025 % cream Apply 1 application topically See admin instructions. Apply a pea-sized amount to the face nightly 09/27/18   [provider]  triamcinolone cream (KENALOG) 0.1 % Apply 1 application topically 2 (two) times daily. 10/19/18   Wieters, Elesa Hacker, PA-C    Family History Family History  Problem Relation Age of Onset  . Breast cancer Paternal Grandmother   . Colon cancer Paternal Grandfather   . Colon cancer Maternal Grandfather   . Colon polyps Maternal Aunt   . Diabetes Mother   . Anesthesia problems Mother        hard to wake up post-op  . Diabetes Sister   . Anesthesia problems Sister        hard to wake up post-op  . Diabetes Father   . Heart disease Father   . Eczema Neg Hx   . Allergic rhinitis Neg Hx   . Asthma Neg Hx     Social History Social History   Tobacco Use  . Smoking status: Former Smoker    Packs/day: 0.00    Years: 14.00    Pack years:  0.00    Types: Cigarettes    Last attempt to quit: 03/17/2017    Years since quitting: 1.7  . Smokeless tobacco: Never Used  Substance Use Topics  . Alcohol use: No  . Drug use: No     Allergies   Adhesive [tape]; Bee venom; and Oatmeal   Review of Systems Review of Systems   Physical Exam Triage Vital Signs ED Triage Vitals  Enc Vitals Group     BP 12/25/18 1540 126/85  Pulse Rate 12/25/18 1540 95     Resp 12/25/18 1540 18     Temp 12/25/18 1540 98.4 F (36.9 C)     Temp Source 12/25/18 1540 Oral     SpO2 12/25/18 1540 100 %     Weight --      Height --      Head Circumference --      Peak Flow --      Pain Score 12/25/18 1538 9     Pain Loc --      Pain Edu? --      Excl. in [place]? --    No data found.  Updated Vital Signs BP 126/85 (BP Location: Left Arm)   Pulse 95   Temp 98.4 F (36.9 C) (Oral)   Resp 18   LMP 08/14/2016 (Exact Date)   SpO2 100%   Visual Acuity Right Eye Distance:   Left Eye Distance:   Bilateral Distance:    Right Eye Near:   Left Eye Near:    Bilateral Near:     Physical Exam Constitutional:      General: She is not in acute distress.    Appearance: She is well-developed.  HENT:     Head:      Comments: Skin above and below bilateral eye brows reddened with some superficial irritation noted; no bleeding; non tender; brows themselves WNL Cardiovascular:     Rate and Rhythm: Normal rate and regular rhythm.     Heart sounds: Normal heart sounds.  Pulmonary:     Effort: Pulmonary effort is normal.     Breath sounds: Normal breath sounds.  Skin:    General: Skin is warm and dry.  Neurological:     Mental Status: She is alert and oriented to person, place, and time.      UC Treatments / Results  Labs (all labs ordered are listed, but only abnormal results are displayed) Labs Reviewed - No data to display  EKG None  Radiology No results found.  Procedures Procedures (including critical care time)   Medications Ordered in UC Medications  ibuprofen (ADVIL) tablet 800 mg (has no administration in time range)    Initial Impression / Assessment and Plan / UC Course  I have reviewed the triage vital signs and the nursing notes.  Pertinent labs & imaging results that were available during my care of the patient were reviewed by me and considered in my medical decision making (see chart for details).     Soft tissue irritation s/p wax. Too hot or just too aggressive and sensitive likely. Supportive cares recommended. Ice/cool compress, nsaids. Return precautions provided. Patient verbalized understanding and agreeable to plan.   Final Clinical Impressions(s) / UC Diagnoses   Final diagnoses:  Skin irritation     Discharge Instructions     I expect this to improve in the next 24-48 hours.  Cool compresses, emollient for comfort, ibuprofen as needed.    ED Prescriptions    None     Controlled Substance Prescriptions [place] Controlled Substance Registry consulted? Not Applicable   Zigmund Gottron, NP 12/25/18 1700

## 2018-12-25 NOTE — ED Triage Notes (Signed)
Pt presents to [facility] after getting her eyebrows done this afternoon with "tinting and waxing".  Rash noted, denies any effect to eyes.

## 2019-01-06 ENCOUNTER — Other Ambulatory Visit: Payer: Self-pay

## 2019-01-06 ENCOUNTER — Encounter: Payer: Self-pay | Admitting: Allergy

## 2019-01-06 ENCOUNTER — Ambulatory Visit (INDEPENDENT_AMBULATORY_CARE_PROVIDER_SITE_OTHER): Payer: Medicaid Other | Admitting: Allergy

## 2019-01-06 VITALS — BP 94/66 | HR 105 | Temp 97.9°F | Resp 16 | Ht 62.0 in

## 2019-01-06 DIAGNOSIS — J310 Chronic rhinitis: Secondary | ICD-10-CM | POA: Diagnosis not present

## 2019-01-06 DIAGNOSIS — J4520 Mild intermittent asthma, uncomplicated: Secondary | ICD-10-CM | POA: Diagnosis not present

## 2019-01-06 DIAGNOSIS — Z91030 Bee allergy status: Secondary | ICD-10-CM

## 2019-01-06 DIAGNOSIS — L508 Other urticaria: Secondary | ICD-10-CM

## 2019-01-06 MED ORDER — CETIRIZINE HCL 10 MG PO TABS: 10.0000 mg | ORAL_TABLET | Freq: Every day | ORAL | 5 refills | Status: DC

## 2019-01-06 MED ORDER — FLUTICASONE PROPIONATE 50 MCG/ACT NA SUSP: 1.0000 | Freq: Every day | NASAL | 5 refills | Status: DC

## 2019-01-06 NOTE — Progress Notes (Signed)
Follow Up Note  RE: Michelle [name] MRN: [id number] DOB: January 12, 1991 Date of Office Visit: 01/06/2019  Referring provider: Rory Percy, DO Primary care provider: Rory Percy, DO  Chief Complaint: Allergic Rhinitis   History of Present Illness: I had the pleasure of seeing Michelle [name] for a follow up visit at the [facility] on 01/06/2019. She is a 28 y.o. female, who is being followed for allergic reaction, asthma, rhinitis, bee sting allergy. Today she is here for regular follow up visit. Her previous allergy office visit was on 11/04/2018 with Dr. Maudie Mercury.   Allergic reaction As long as she takes zyrtec 54m daily and Flonase 1 spray daily there has been no flare but on the days she misses it the following day she wakes up with hives/rash on the legs. One episode of facial rash a few days ago.  Currently avoiding oatmeal but tolerating eggs with no issues.  Still has Epipen and has not had to use it.   Mild intermittent asthma without complication Denies any SOB, coughing, wheezing, chest tightness, nocturnal awakenings, ER/urgent care visits or prednisone use since the last visit. No albuterol use since the last visit.   Chronic rhinitis No sinus issues.  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   Assessment and Plan: Michelle [name] a 28y.o. female with: Chronic urticaria Past history - Reaction to premade oatmeal cookies 1 year ago in the form of rash/hives. Patient used to tolerate these cookies with no issues. Then in March/April noticed difficulty breathing after eating eggs and on Easter had a hive outbreak after eating an egg. 2020 testing negative to foods. 2020 immunocap negative to foods as well. Slightly elevated tryptase, normal c kit.  Interim history - tolerating eggs with no issues. Only breaks out if misses zyrtec.  .Marland KitchenBased on clinical history, she likely has chronic idiopathic urticaria. Discussed with patient, that urticaria is  usually caused by release of histamine by cutaneous mast cells but sometimes it is non-histamine mediated. Explained that urticaria is not always associated with allergies, and may be related to other infectious or autoimmune causes. In most cases, the exact etiology for urticaria can not be established and it is considered idiopathic.  o The episodes after egg and oatmeals were most likely coincidental.  . Avoid the following potential triggers: alcohol, tight clothing, NSAIDs.  . Take tylenol for pain. . Continue zyrtec 159monce a day and may increase to twice a day if needed. . If you breakout in rash/hives, take pictures.  . You may try the oatmeal at home. . For mild symptoms you can take over the counter antihistamines such as Benadryl and monitor symptoms closely. If symptoms worsen or if you have severe symptoms including breathing issues, throat closure, significant swelling, whole body hives, severe diarrhea and vomiting, lightheadedness then inject epinephrine and seek immediate medical care afterwards.  Mild intermittent asthma without complication Past history - Diagnosed with asthma over 10 years ago and currently has albuterol which helps. Using albuterol once a week.  Interim history - no issues and no albuterol use since last visit.   May use albuterol rescue inhaler 2 puffs or nebulizer every 4 to 6 hours as needed for shortness of breath, chest tightness, coughing, and wheezing. May use albuterol rescue inhaler 2 puffs 5 to 15 minutes prior to strenuous physical activities. Monitor frequency of use.   Bee sting allergy Past history - Stung by an insect 6 months ago  and had hives for 1 week. Needed steroid injection. Interim history - bloodwork slightly positive to honeybee.  Avoid bee stings and carry Epipen especially when outdoors in the warmer weather.   For mild symptoms you can take over the counter antihistamines such as Benadryl and monitor symptoms closely. If  symptoms worsen or if you have severe symptoms including breathing issues, throat closure, significant swelling, whole body hives, severe diarrhea and vomiting, lightheadedness then inject epinephrine and seek immediate medical care afterwards.  Chronic rhinitis Past history - Rhino conjunctivitis symptoms for the past 10+ years during the spring only. Using Claritin prn with good benefit. 2020 skin testing and immunocap was negative to environmental allergies. Interim history - good control with Flonase.  Continue Flonase 1 spray 1-2 times a day as needed.   Return in about 4 months (around 05/08/2019).  Meds ordered this encounter  Medications  . fluticasone (FLONASE) 50 MCG/ACT nasal spray    Sig: Place 1 spray into both nostrils daily.    Dispense:  15.8 mL    Refill:  5  . cetirizine (ZYRTEC) 10 MG tablet    Sig: Take 1 tablet (10 mg total) by mouth daily.    Dispense:  30 tablet    Refill:  5   Diagnostics: None.  Medication List:  Current Outpatient Medications  Medication Sig Dispense Refill  . albuterol (PROAIR HFA) 108 (90 Base) MCG/ACT inhaler Inhale 2 puffs into the lungs every 6 (six) hours as needed for wheezing or shortness of breath.    . clindamycin-benzoyl peroxide (BENZACLIN) gel Apply 1 application topically at bedtime.     . clotrimazole (LOTRIMIN) 1 % cream Apply to affected area 2 times daily 15 g 0  . diphenhydrAMINE HCl (BENADRYL ALLERGY PO) Take by mouth.    . doxycycline (MONODOX) 100 MG capsule Take 100 mg by mouth 2 (two) times daily.    Marland Kitchen EPINEPHrine (EPIPEN 2-PAK) 0.3 mg/0.3 mL IJ SOAJ injection Inject 0.3 mLs (0.3 mg total) into the muscle once as needed for up to 1 dose for anaphylaxis. 2 Device 2  . fluticasone (FLONASE) 50 MCG/ACT nasal spray Place 1 spray into both nostrils daily. 15.8 mL 5  . ibuprofen (ADVIL,MOTRIN) 200 MG tablet Take 800 mg by mouth every 6 (six) hours as needed (pain).    Marland Kitchen ipratropium-albuterol (DUONEB) 0.5-2.5 (3) MG/3ML  SOLN Take 3 mLs by nebulization every 4 (four) hours as needed. 360 mL 0  . RETIN-A 0.025 % cream Apply 1 application topically See admin instructions. Apply a pea-sized amount to the face nightly    . triamcinolone cream (KENALOG) 0.1 % Apply 1 application topically 2 (two) times daily. 30 g 0  . cetirizine (ZYRTEC) 10 MG tablet Take 1 tablet (10 mg total) by mouth daily. 30 tablet 5   No current facility-administered medications for this visit.    Allergies: Allergies  Allergen Reactions  . Adhesive [Tape] Hives and Other (See Comments)    EKG or heart monitor pads- caused raised, red areas (WELTS) on the skin; "sensitive" pads fell off  . Bee Venom Hives and Other (See Comments)    Welts, also  . Oatmeal Hives    Hives from the waist down and had to seek medical treatment   I reviewed her past medical history, social history, family history, and environmental history and no significant changes have been reported from previous visit on 11/04/2018.  Review of Systems  Constitutional: Negative for appetite change, chills, fever and unexpected weight  change.  HENT: Negative for congestion, rhinorrhea and sneezing.   Eyes: Negative for itching.  Respiratory: Negative for cough, chest tightness, shortness of breath and wheezing.   Cardiovascular: Negative for chest pain.  Gastrointestinal: Negative for abdominal pain.  Genitourinary: Negative for difficulty urinating.  Skin: Positive for rash.  Allergic/Immunologic: Negative for environmental allergies.  Neurological: Negative for headaches.   Objective: BP 94/66   Pulse (!) 105   Temp 97.9 F (36.6 C) (Temporal)   Resp 16   Ht _0  (1.575 m)   LMP 08/14/2016 (Exact Date)   SpO2 98%   BMI 26.70 kg/m  Body mass index is 26.7 kg/m. Physical Exam  Constitutional: She is oriented to person, place, and time. She appears well-developed and well-nourished.  HENT:  Head: Normocephalic and atraumatic.  Right Ear: External ear  normal.  Left Ear: External ear normal.  Nose: Nose normal.  Mouth/Throat: Oropharynx is clear and moist.  Eyes: Conjunctivae and EOM are normal.  Neck: Neck supple.  Cardiovascular: Normal rate, regular rhythm and normal heart sounds. Exam reveals no gallop and no friction rub.  No murmur heard. Pulmonary/Chest: Effort normal and breath sounds normal. She has no wheezes. She has no rales.  Abdominal: Soft.  Neurological: She is alert and oriented to person, place, and time.  Skin: Skin is warm. No rash noted.  Psychiatric: She has a normal mood and affect. Her behavior is normal.  Nursing note and vitals reviewed.  Previous notes and tests were reviewed. The plan was reviewed with the patient/family, and all questions/concerned were addressed.  It was my pleasure to see Michelle [name] today and participate in her care. Please feel free to contact me with any questions or concerns.  Sincerely,  Rexene Alberts, DO Allergy & Immunology  [facility] office: [phone] [facility] office: [phone]

## 2019-01-06 NOTE — Patient Instructions (Addendum)
Urticaria/hives: Marland Kitchen Based on clinical history, she likely has chronic idiopathic urticaria. Discussed with patient, that urticaria is usually caused by release of histamine by cutaneous mast cells but sometimes it is non-histamine mediated. Explained that urticaria is not always associated with allergies, and may be related to other infectious or autoimmune causes. In most cases, the exact etiology for urticaria can not be established and it is considered idiopathic. Marland Kitchen Avoid the following potential triggers: alcohol, tight clothing, NSAIDs.  . Take tylenol for pain. . Continue zyrtec 10mg  once a day and may increase to twice a day if needed. . If you breakout in rash/hives, take pictures.  . You may try the oatmeal at home. . For mild symptoms you can take over the counter antihistamines such as Benadryl and monitor symptoms closely. If symptoms worsen or if you have severe symptoms including breathing issues, throat closure, significant swelling, whole body hives, severe diarrhea and vomiting, lightheadedness then inject epinephrine and seek immediate medical care afterwards.  Mild intermittent asthma without complication  May use albuterol rescue inhaler 2 puffs or nebulizer every 4 to 6 hours as needed for shortness of breath, chest tightness, coughing, and wheezing. May use albuterol rescue inhaler 2 puffs 5 to 15 minutes prior to strenuous physical activities. Monitor frequency of use.   Chronic rhinitis  May use Flonase 1 spray 1-2 times per day as needed.   Bee sting allergy  Avoid bee sting and carry epipen with you especially when you are outdoors in the warmer weather.   Follow up in 4 months

## 2019-01-06 NOTE — Assessment & Plan Note (Signed)
Past history - Reaction to premade oatmeal cookies 1 year ago in the form of rash/hives. Patient used to tolerate these cookies with no issues. Then in March/April noticed difficulty breathing after eating eggs and on Easter had a hive outbreak after eating an egg. 2020 testing negative to foods. 2020 immunocap negative to foods as well. Slightly elevated tryptase, normal c kit.  Interim history - tolerating eggs with no issues. Only breaks out if misses zyrtec.  Marland Kitchen Based on clinical history, she likely has chronic idiopathic urticaria. Discussed with patient, that urticaria is usually caused by release of histamine by cutaneous mast cells but sometimes it is non-histamine mediated. Explained that urticaria is not always associated with allergies, and may be related to other infectious or autoimmune causes. In most cases, the exact etiology for urticaria can not be established and it is considered idiopathic.  o The episodes after egg and oatmeals were most likely coincidental.  . Avoid the following potential triggers: alcohol, tight clothing, NSAIDs.  . Take tylenol for pain. . Continue zyrtec 9m once a day and may increase to twice a day if needed. . If you breakout in rash/hives, take pictures.  . You may try the oatmeal at home. . For mild symptoms you can take over the counter antihistamines such as Benadryl and monitor symptoms closely. If symptoms worsen or if you have severe symptoms including breathing issues, throat closure, significant swelling, whole body hives, severe diarrhea and vomiting, lightheadedness then inject epinephrine and seek immediate medical care afterwards.

## 2019-01-06 NOTE — Assessment & Plan Note (Signed)
Past history - Diagnosed with asthma over 10 years ago and currently has albuterol which helps. Using albuterol once a week.  Interim history - no issues and no albuterol use since last visit.   May use albuterol rescue inhaler 2 puffs or nebulizer every 4 to 6 hours as needed for shortness of breath, chest tightness, coughing, and wheezing. May use albuterol rescue inhaler 2 puffs 5 to 15 minutes prior to strenuous physical activities. Monitor frequency of use.

## 2019-01-06 NOTE — Assessment & Plan Note (Signed)
Past history - Stung by an insect 6 months ago and had hives for 1 week. Needed steroid injection. Interim history - bloodwork slightly positive to honeybee.  Avoid bee stings and carry Epipen especially when outdoors in the warmer weather.   For mild symptoms you can take over the counter antihistamines such as Benadryl and monitor symptoms closely. If symptoms worsen or if you have severe symptoms including breathing issues, throat closure, significant swelling, whole body hives, severe diarrhea and vomiting, lightheadedness then inject epinephrine and seek immediate medical care afterwards.

## 2019-01-06 NOTE — Assessment & Plan Note (Signed)
Past history - Rhino conjunctivitis symptoms for the past 10+ years during the spring only. Using Claritin prn with good benefit. 2020 skin testing and immunocap was negative to environmental allergies. Interim history - good control with Flonase.  Continue Flonase 1 spray 1-2 times a day as needed.

## 2019-01-07 ENCOUNTER — Other Ambulatory Visit: Payer: Self-pay

## 2019-01-07 ENCOUNTER — Ambulatory Visit (INDEPENDENT_AMBULATORY_CARE_PROVIDER_SITE_OTHER): Payer: Medicaid Other

## 2019-01-07 ENCOUNTER — Ambulatory Visit (HOSPITAL_COMMUNITY)
Admission: EM | Admit: 2019-01-07 | Discharge: 2019-01-07 | Disposition: A | Discharge status: Home / Self Care | Payer: Medicaid Other | Attending: Family Medicine | Admitting: Family Medicine

## 2019-01-07 ENCOUNTER — Encounter (HOSPITAL_COMMUNITY): Payer: Self-pay

## 2019-01-07 DIAGNOSIS — R319 Hematuria, unspecified: Secondary | ICD-10-CM | POA: Insufficient documentation

## 2019-01-07 DIAGNOSIS — R110 Nausea: Secondary | ICD-10-CM | POA: Diagnosis not present

## 2019-01-07 DIAGNOSIS — N200 Calculus of kidney: Secondary | ICD-10-CM

## 2019-01-07 DIAGNOSIS — R1031 Right lower quadrant pain: Secondary | ICD-10-CM

## 2019-01-07 LAB — CBC
HCT: 40.7 % (ref 36.0–46.0)
Hemoglobin: 13.9 g/dL (ref 12.0–15.0)
MCH: 31.2 pg (ref 26.0–34.0)
MCHC: 34.2 g/dL (ref 30.0–36.0)
MCV: 91.3 fL (ref 80.0–100.0)
Platelets: 200 10*3/uL (ref 150–400)
RBC: 4.46 MIL/uL (ref 3.87–5.11)
RDW: 11.4 % — ABNORMAL LOW (ref 11.5–15.5)
WBC: 12.9 10*3/uL — ABNORMAL HIGH (ref 4.0–10.5)
nRBC: 0 % (ref 0.0–0.2)

## 2019-01-07 LAB — POCT URINALYSIS DIP (DEVICE)
Bilirubin Urine: NEGATIVE
Glucose, UA: NEGATIVE mg/dL
Ketones, ur: 15 mg/dL — AB
Nitrite: NEGATIVE
Protein, ur: 100 mg/dL — AB
Specific Gravity, Urine: 1.015 (ref 1.005–1.030)
Urobilinogen, UA: 0.2 mg/dL (ref 0.0–1.0)
pH: 7.5 (ref 5.0–8.0)

## 2019-01-07 MED ORDER — TAMSULOSIN HCL 0.4 MG PO CAPS: 0.4000 mg | ORAL_CAPSULE | Freq: Every day | ORAL | 0 refills | Status: DC

## 2019-01-07 MED ORDER — KETOROLAC TROMETHAMINE 60 MG/2ML IM SOLN
60.0000 mg | Freq: Once | INTRAMUSCULAR | Status: AC
  Administered 2019-01-07: 60 mg via INTRAMUSCULAR

## 2019-01-07 MED ORDER — HYDROCODONE-ACETAMINOPHEN 7.5-325 MG PO TABS: 1.0000 | ORAL_TABLET | Freq: Four times a day (QID) | ORAL | 0 refills | Status: DC | PRN

## 2019-01-07 MED ORDER — KETOROLAC TROMETHAMINE 60 MG/2ML IM SOLN
INTRAMUSCULAR | Status: AC
  Filled 2019-01-07: qty 2

## 2019-01-07 NOTE — ED Notes (Signed)
CBC was completed by me. Not showing in chart.

## 2019-01-07 NOTE — ED Provider Notes (Signed)
[place]    CSN: [id number] Arrival date & time: 01/07/19  1254     History   Chief Complaint Chief Complaint  Patient presents with  . Abdominal Pain    HPI Michelle [name] is a 28 y.o. female.   HPI  Is here for abdominal pain.  Is been going on since last night.  She has severe crampy right lower abdominal pain.  She is doubled over with the pain.  Nausea but no vomiting.  Normal bowel movement this morning.  She has had a hysterectomy.  She has had an appendectomy.  She is uncertain whether she has tubes or ovaries.  She is never had this before.  Has had no sweats or chills.  She has no known colon disorder.  No flank pain or kidney pain.  No dysuria or frequency.  No hematuria.  No history of kidney stone.  Colon disorder, diverticula, colitis.  Past Medical History:  Diagnosis Date  . Anxiety   . Asthma   . Bipolar disorder ([place])    no current med.  . Depression    no current med.  . Family history of adverse reaction to anesthesia    states mother and sister are hard to wake up post-op  . Gestational diabetes   . History of seizure 06/20/2012   x 1 - during delivery of child(eclampsia)  . Hypertension   . Lipoma of lower back 08/2015  . Migraine   . PTSD (post-traumatic stress disorder)   . PTSD (post-traumatic stress disorder)   . Seizures ([place])   . Tachycardia   . TMJ (dislocation of temporomandibular joint)   . Vertigo     Patient Active Problem List   Diagnosis Date Noted  . Eustachian tube dysfunction, bilateral 12/16/2018  . Bee sting allergy 11/06/2018  . Allergic reaction 11/04/2018  . Mild intermittent asthma without complication 09/38/1829  . Chronic rhinitis 11/04/2018  . Shortness of breath 10/15/2018  . Chronic urticaria 10/04/2018  . Rash and nonspecific skin eruption 11/16/2017  . Easy bruising 11/16/2017  . Anxiety state 10/27/2016  . Migraine 02/10/2016  . PTSD 03/02/2010  . 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 ABDOMINAL HYSTERECTOMY    . ADENOIDECTOMY, TONSILLECTOMY AND MYRINGOTOMY WITH TUBE PLACEMENT  1990's  . APPENDECTOMY    . CESAREAN SECTION  06/20/2012   Procedure: CESAREAN SECTION;  Surgeon: Emily Filbert, MD;  Location: [facility];  Service: Obstetrics;  Laterality: N/A;  . LAPAROSCOPIC APPENDECTOMY  07/25/2012   Procedure: APPENDECTOMY LAPAROSCOPIC;  Surgeon: Zenovia Jarred, MD;  Location: [facility];  Service: General;  Laterality: N/A;  . LAPAROSCOPIC TUBAL LIGATION Bilateral 08/04/2014   Procedure: BILATERAL LAPAROSCOPIC TUBAL LIGATION;  Surgeon: Woodroe Mode, MD;  Location: [facility];  Service: Gynecology;  Laterality: Bilateral;  . LIPOMA EXCISION N/A 09/16/2015   Procedure: EXCISION LIPOMA LUMBAR REGION;  Surgeon: Donnie Mesa, MD;  Location: [facility];  Service: General;  Laterality: N/A;  . TONSILLECTOMY    . WISDOM TOOTH EXTRACTION      OB History    Gravida  3   Para  3   Term  2   Preterm  1   AB  0   Living  3     SAB  0   TAB  0   Ectopic  0   Multiple  0   Live Births  1  Obstetric Comments  C-Section Indication: Non-reassuring fetal tracing, growth delay & marked proteinuria & Pre-Eclampsia Eclamptic Seizure during C-section delivery         Home Medications    Prior to Admission medications   Medication Sig Start Date End Date Taking? Authorizing Provider  albuterol (PROAIR HFA) 108 (90 Base) MCG/ACT inhaler Inhale 2 puffs into the lungs every 6 (six) hours as needed for wheezing or shortness of breath.    [provider]  cetirizine (ZYRTEC) 10 MG tablet Take 1 tablet (10 mg total) by mouth daily. 01/06/19   Garnet Sierras, DO  clindamycin-benzoyl peroxide (BENZACLIN) gel Apply 1 application topically at bedtime.  09/27/18   [provider]  diphenhydrAMINE HCl (BENADRYL ALLERGY PO) Take by mouth.    [provider]  doxycycline (MONODOX)  100 MG capsule Take 100 mg by mouth 2 (two) times daily. 09/27/18   [provider]  EPINEPHrine (EPIPEN 2-PAK) 0.3 mg/0.3 mL IJ SOAJ injection Inject 0.3 mLs (0.3 mg total) into the muscle once as needed for up to 1 dose for anaphylaxis. 11/04/18   Garnet Sierras, DO  fluticasone (FLONASE) 50 MCG/ACT nasal spray Place 1 spray into both nostrils daily. 01/06/19   Garnet Sierras, DO  HYDROcodone-acetaminophen (NORCO) 7.5-325 MG tablet Take 1 tablet by mouth every 6 (six) hours as needed for moderate pain. 01/07/19   Raylene Everts, MD  ibuprofen (ADVIL,MOTRIN) 200 MG tablet Take 800 mg by mouth every 6 (six) hours as needed (pain).    [provider]  ipratropium-albuterol (DUONEB) 0.5-2.5 (3) MG/3ML SOLN Take 3 mLs by nebulization every 4 (four) hours as needed. 10/13/18   Raylene Everts, MD  RETIN-A 0.025 % cream Apply 1 application topically See admin instructions. Apply a pea-sized amount to the face nightly 09/27/18   [provider]  tamsulosin (FLOMAX) 0.4 MG CAPS capsule Take 1 capsule (0.4 mg total) by mouth daily after supper. 01/07/19   Raylene Everts, MD  triamcinolone cream (KENALOG) 0.1 % Apply 1 application topically 2 (two) times daily. 10/19/18   Wieters, Elesa Hacker, PA-C    Family History Family History  Problem Relation Age of Onset  . Breast cancer Paternal Grandmother   . Colon cancer Paternal Grandfather   . Colon cancer Maternal Grandfather   . Colon polyps Maternal Aunt   . Diabetes Mother   . Anesthesia problems Mother        hard to wake up post-op  . Diabetes Sister   . Anesthesia problems Sister        hard to wake up post-op  . Diabetes Father   . Heart disease Father   . Eczema Neg Hx   . Allergic rhinitis Neg Hx   . Asthma Neg Hx     Social History Social History   Tobacco Use  . Smoking status: Former Smoker    Packs/day: 0.00    Years: 14.00    Pack years: 0.00    Types: Cigarettes    Quit date: 03/17/2017    Years since  quitting: 1.8  . Smokeless tobacco: Never Used  Substance Use Topics  . Alcohol use: No  . Drug use: No     Allergies   Adhesive [tape], Bee venom, and Oatmeal   Review of Systems Review of Systems  Constitutional: Positive for activity change. Negative for chills and fever.  HENT: Negative for ear pain and sore throat.   Eyes: Negative for pain and visual disturbance.  Respiratory: Negative for cough and shortness of breath.   Cardiovascular: Negative for chest pain and palpitations.  Gastrointestinal: Positive for abdominal pain and nausea. Negative for vomiting.  Genitourinary: Negative for dysuria and hematuria.  Musculoskeletal: Negative for arthralgias and back pain.  Skin: Negative for color change and rash.  Neurological: Negative for seizures and syncope.  Psychiatric/Behavioral: Positive for sleep disturbance.  All other systems reviewed and are negative.    Physical Exam Triage Vital Signs ED Triage Vitals [01/07/19 1333]  Enc Vitals Group     BP 133/79     Pulse Rate (!) 109     Resp 16     Temp 98.6 F (37 C)     Temp Source Oral     SpO2 100 %     Weight 140 lb (63.5 kg)     Height      Head Circumference      Peak Flow      Pain Score 10     Pain Loc      Pain Edu?      Excl. in [place]?    No data found.  Updated Vital Signs BP 133/79 (BP Location: Left Arm)   Pulse (!) 109   Temp 98.6 F (37 C) (Oral)   Resp 16   Wt 63.5 kg   LMP 08/14/2016 (Exact Date)   SpO2 100%   BMI 25.61 kg/m     Physical Exam Constitutional:      General: She is in acute distress.     Appearance: She is well-developed.     Comments: She is quite uncomfortable.  Bent over at waist.  HENT:     Head: Normocephalic and atraumatic.     Mouth/Throat:     Mouth: Mucous membranes are moist.  Eyes:     Extraocular Movements: Extraocular movements intact.     Conjunctiva/sclera: Conjunctivae normal.     Pupils: Pupils are equal, round, and reactive to light.  Neck:      Musculoskeletal: Normal range of motion.  Cardiovascular:     Rate and Rhythm: Normal rate.     Heart sounds: Normal heart sounds.  Pulmonary:     Effort: Pulmonary effort is normal. No respiratory distress.     Breath sounds: Normal breath sounds.  Abdominal:     General: Abdomen is flat. Bowel sounds are normal. There is no distension.     Palpations: Abdomen is soft. There is no hepatomegaly or splenomegaly.     Tenderness: There is abdominal tenderness in the right lower quadrant. There is no right CVA tenderness or left CVA tenderness.     Hernia: No hernia is present.     Comments: Very mild tenderness to deep palpation in the right lower quadrant.  No guarding.  No rebound.  No mass.  No organomegaly.  No CVA tenderness.  Musculoskeletal: Normal range of motion.  Skin:    General: Skin is warm and dry.  Neurological:     Mental Status: She is alert.      UC Treatments / Results  Labs (all labs ordered are listed, but only abnormal results are displayed) Labs Reviewed  CBC - Abnormal; Notable for the following components:      Result Value   WBC 12.9 (*)    RDW 11.4 (*)    All other components within normal limits  POCT URINALYSIS DIP (DEVICE) - Abnormal; Notable for the following components:   Ketones, ur 15 (*)    Hgb urine  dipstick LARGE (*)    Protein, ur 100 (*)    Leukocytes,Ua SMALL (*)    All other components within normal limits    EKG None  Radiology Dg Abd Acute W/chest  Result Date: 01/07/2019 CLINICAL DATA:  Acute right lower quadrant abdominal pain. EXAM: DG ABDOMEN ACUTE W/ 1V CHEST COMPARISON:  None. FINDINGS: There is no evidence of dilated bowel loops or free intraperitoneal air. No radiopaque calculi or other significant radiographic abnormality is seen. Heart size and mediastinal contours are within normal limits. Both lungs are clear. IMPRESSION: No evidence of bowel obstruction or ileus. No acute cardiopulmonary disease. Electronically  Signed   By: Marijo Conception M.D.   On: 01/07/2019 15:22    Procedures Procedures (including critical care time)  Medications Ordered in UC Medications  ketorolac (TORADOL) injection 60 mg (60 mg Intramuscular Given 01/07/19 1442)  ketorolac (TORADOL) 60 MG/2ML injection (has no administration in time range)    Initial Impression / Assessment and Plan / UC Course  I have reviewed the triage vital signs and the nursing notes.  Pertinent labs & imaging results that were available during my care of the patient were reviewed by me and considered in my medical decision making (see chart for details).   ` On reexamination the patient does have some tenderness to palpation deep in the mid right abdomen in the kidney area, no tenderness in the lower quadrant.  With your hematuria (status post hysterectomy) I think kidney stone is unlikely diagnosis.  Patient is offered evaluation in the emergency room and CT scanning but prefers not to go to the ER if she can handle this herself.  She feels much better after the Toradol injection.  Strict return instructions are given.   Final Clinical Impressions(s) / UC Diagnoses   Final diagnoses:  Right lower quadrant abdominal pain  Hematuria, unspecified type  Kidney stone     Discharge Instructions     I believe you have a kidney stone.  I cannot confirm this without a CAT scan.  I am hopeful it will pass on its own. Take the tamsulosin once a day.  This helps relax the urinary tract. Take ibuprofen for mild to moderate pain. Take hydrocodone for severe pain.  Take with food.  Do not drive on hydrocodone. If you get worse instead of better, develop vomiting or fever, or have persistent symptoms then go to the emergency department    ED Prescriptions    Medication Sig Dispense Auth. Provider   HYDROcodone-acetaminophen (NORCO) 7.5-325 MG tablet Take 1 tablet by mouth every 6 (six) hours as needed for moderate pain. 15 tablet Raylene Everts,  MD   tamsulosin (FLOMAX) 0.4 MG CAPS capsule Take 1 capsule (0.4 mg total) by mouth daily after supper. 14 capsule Raylene Everts, MD     Controlled Substance Prescriptions [place] Controlled Substance Registry consulted? Yes, I have consulted the [place] Controlled Substances Registry for this patient, and feel the risk/benefit ratio today is favorable for proceeding with this prescription for a controlled substance.   Raylene Everts, MD 01/07/19 [phone]

## 2019-01-07 NOTE — Discharge Instructions (Addendum)
I believe you have a kidney stone.  I cannot confirm this without a CAT scan.  I am hopeful it will pass on its own. Take the tamsulosin once a day.  This helps relax the urinary tract. Take ibuprofen for mild to moderate pain. Take hydrocodone for severe pain.  Take with food.  Do not drive on hydrocodone. If you get worse instead of better, develop vomiting or fever, or have persistent symptoms then go to the emergency department

## 2019-01-07 NOTE — ED Triage Notes (Signed)
Pt states she has RLQ pain this started last night. This pain is very intense pain. (sharp ).

## 2019-01-10 ENCOUNTER — Telehealth (HOSPITAL_COMMUNITY): Payer: Self-pay | Admitting: Emergency Medicine

## 2019-01-10 NOTE — Telephone Encounter (Signed)
Patient contacted and made aware of all results, all questions answered.  Pt states she is feeling better.

## 2019-01-12 ENCOUNTER — Emergency Department (HOSPITAL_COMMUNITY): Payer: Medicaid Other

## 2019-01-12 ENCOUNTER — Encounter (HOSPITAL_COMMUNITY): Payer: Self-pay

## 2019-01-12 ENCOUNTER — Other Ambulatory Visit: Payer: Self-pay

## 2019-01-12 ENCOUNTER — Inpatient Hospital Stay (HOSPITAL_COMMUNITY)
Admission: EM | Admit: 2019-01-12 | Discharge: 2019-01-14 | DRG: 872 | Disposition: A | Discharge status: Home / Self Care | Payer: Medicaid Other | Attending: Family Medicine | Admitting: Family Medicine

## 2019-01-12 DIAGNOSIS — I10 Essential (primary) hypertension: Secondary | ICD-10-CM | POA: Diagnosis present

## 2019-01-12 DIAGNOSIS — F4310 Post-traumatic stress disorder, unspecified: Secondary | ICD-10-CM | POA: Diagnosis present

## 2019-01-12 DIAGNOSIS — Z1611 Resistance to penicillins: Secondary | ICD-10-CM | POA: Diagnosis present

## 2019-01-12 DIAGNOSIS — F411 Generalized anxiety disorder: Secondary | ICD-10-CM | POA: Diagnosis present

## 2019-01-12 DIAGNOSIS — Z87891 Personal history of nicotine dependence: Secondary | ICD-10-CM

## 2019-01-12 DIAGNOSIS — Z9049 Acquired absence of other specified parts of digestive tract: Secondary | ICD-10-CM

## 2019-01-12 DIAGNOSIS — Z1159 Encounter for screening for other viral diseases: Secondary | ICD-10-CM | POA: Diagnosis not present

## 2019-01-12 DIAGNOSIS — R6520 Severe sepsis without septic shock: Secondary | ICD-10-CM

## 2019-01-12 DIAGNOSIS — Z7951 Long term (current) use of inhaled steroids: Secondary | ICD-10-CM | POA: Diagnosis not present

## 2019-01-12 DIAGNOSIS — Z90710 Acquired absence of both cervix and uterus: Secondary | ICD-10-CM | POA: Diagnosis not present

## 2019-01-12 DIAGNOSIS — F319 Bipolar disorder, unspecified: Secondary | ICD-10-CM | POA: Diagnosis present

## 2019-01-12 DIAGNOSIS — N200 Calculus of kidney: Secondary | ICD-10-CM | POA: Diagnosis not present

## 2019-01-12 DIAGNOSIS — E876 Hypokalemia: Secondary | ICD-10-CM | POA: Diagnosis present

## 2019-01-12 DIAGNOSIS — J45909 Unspecified asthma, uncomplicated: Secondary | ICD-10-CM | POA: Diagnosis not present

## 2019-01-12 DIAGNOSIS — B961 Klebsiella pneumoniae [K. pneumoniae] as the cause of diseases classified elsewhere: Secondary | ICD-10-CM | POA: Diagnosis present

## 2019-01-12 DIAGNOSIS — Z7952 Long term (current) use of systemic steroids: Secondary | ICD-10-CM | POA: Diagnosis not present

## 2019-01-12 DIAGNOSIS — F419 Anxiety disorder, unspecified: Secondary | ICD-10-CM

## 2019-01-12 DIAGNOSIS — R509 Fever, unspecified: Secondary | ICD-10-CM | POA: Diagnosis not present

## 2019-01-12 DIAGNOSIS — N136 Pyonephrosis: Secondary | ICD-10-CM | POA: Diagnosis present

## 2019-01-12 DIAGNOSIS — J4520 Mild intermittent asthma, uncomplicated: Secondary | ICD-10-CM | POA: Diagnosis present

## 2019-01-12 DIAGNOSIS — K5900 Constipation, unspecified: Secondary | ICD-10-CM | POA: Diagnosis present

## 2019-01-12 DIAGNOSIS — A419 Sepsis, unspecified organism: Principal | ICD-10-CM | POA: Diagnosis present

## 2019-01-12 DIAGNOSIS — Z20828 Contact with and (suspected) exposure to other viral communicable diseases: Secondary | ICD-10-CM | POA: Diagnosis not present

## 2019-01-12 DIAGNOSIS — Z79899 Other long term (current) drug therapy: Secondary | ICD-10-CM | POA: Diagnosis not present

## 2019-01-12 DIAGNOSIS — E872 Acidosis: Secondary | ICD-10-CM | POA: Diagnosis present

## 2019-01-12 DIAGNOSIS — R1031 Right lower quadrant pain: Secondary | ICD-10-CM | POA: Diagnosis not present

## 2019-01-12 LAB — CBC WITH DIFFERENTIAL/PLATELET
Abs Immature Granulocytes: 0.04 10*3/uL (ref 0.00–0.07)
Basophils Absolute: 0.1 10*3/uL (ref 0.0–0.1)
Basophils Relative: 0 %
Eosinophils Absolute: 0 10*3/uL (ref 0.0–0.5)
Eosinophils Relative: 0 %
HCT: 41.5 % (ref 36.0–46.0)
Hemoglobin: 14 g/dL (ref 12.0–15.0)
Immature Granulocytes: 0 %
Lymphocytes Relative: 10 %
Lymphs Abs: 1.6 10*3/uL (ref 0.7–4.0)
MCH: 31.7 pg (ref 26.0–34.0)
MCHC: 33.7 g/dL (ref 30.0–36.0)
MCV: 93.9 fL (ref 80.0–100.0)
Monocytes Absolute: 1 10*3/uL (ref 0.1–1.0)
Monocytes Relative: 7 %
Neutro Abs: 12.3 10*3/uL — ABNORMAL HIGH (ref 1.7–7.7)
Neutrophils Relative %: 83 %
Platelets: 177 10*3/uL (ref 150–400)
RBC: 4.42 MIL/uL (ref 3.87–5.11)
RDW: 11.2 % — ABNORMAL LOW (ref 11.5–15.5)
WBC: 14.9 10*3/uL — ABNORMAL HIGH (ref 4.0–10.5)
nRBC: 0 % (ref 0.0–0.2)

## 2019-01-12 LAB — URINALYSIS, ROUTINE W REFLEX MICROSCOPIC
Bilirubin Urine: NEGATIVE
Glucose, UA: NEGATIVE mg/dL
Ketones, ur: NEGATIVE mg/dL
Leukocytes,Ua: NEGATIVE
Nitrite: NEGATIVE
Protein, ur: NEGATIVE mg/dL
Specific Gravity, Urine: 1.01 (ref 1.005–1.030)
pH: 7 (ref 5.0–8.0)

## 2019-01-12 LAB — COMPREHENSIVE METABOLIC PANEL
ALT: 15 U/L (ref 0–44)
AST: 17 U/L (ref 15–41)
Albumin: 4.3 g/dL (ref 3.5–5.0)
Alkaline Phosphatase: 52 U/L (ref 38–126)
Anion gap: 9 (ref 5–15)
BUN: 8 mg/dL (ref 6–20)
CO2: 23 mmol/L (ref 22–32)
Calcium: 9 mg/dL (ref 8.9–10.3)
Chloride: 103 mmol/L (ref 98–111)
Creatinine, Ser: 0.81 mg/dL (ref 0.44–1.00)
GFR calc Af Amer: >60 mL/min (ref >60)
GFR calc non Af Amer: >60 mL/min (ref >60)
Glucose, Bld: 105 mg/dL — ABNORMAL HIGH (ref 70–99)
Potassium: 3.3 mmol/L — ABNORMAL LOW (ref 3.5–5.1)
Sodium: 135 mmol/L (ref 135–145)
Total Bilirubin: 0.8 mg/dL (ref 0.3–1.2)
Total Protein: 7.3 g/dL (ref 6.5–8.1)

## 2019-01-12 LAB — LACTIC ACID, PLASMA
Lactic Acid, Venous: 2.4 mmol/L (ref 0.5–1.9)
Lactic Acid, Venous: 2.1 mmol/L (ref 0.5–1.9)

## 2019-01-12 LAB — SARS CORONAVIRUS 2 BY RT PCR (HOSPITAL ORDER, PERFORMED IN ~~LOC~~ HOSPITAL LAB): SARS Coronavirus 2: NEGATIVE

## 2019-01-12 LAB — LIPASE, BLOOD: Lipase: 21 U/L (ref 11–51)

## 2019-01-12 MED ORDER — SODIUM CHLORIDE 0.9 % IV SOLN
2.0000 g | Freq: Once | INTRAVENOUS | Status: AC
  Administered 2019-01-12: 2 g via INTRAVENOUS
  Filled 2019-01-12: qty 20

## 2019-01-12 MED ORDER — ACETAMINOPHEN 325 MG PO TABS
650.0000 mg | ORAL_TABLET | Freq: Four times a day (QID) | ORAL | Status: DC | PRN
  Administered 2019-01-12 – 2019-01-13 (×2): 650 mg via ORAL
  Filled 2019-01-12 (×2): qty 2

## 2019-01-12 MED ORDER — ONDANSETRON HCL 4 MG/2ML IJ SOLN: 4.0000 mg | Freq: Four times a day (QID) | INTRAMUSCULAR | Status: DC | PRN

## 2019-01-12 MED ORDER — METRONIDAZOLE IN NACL 5-0.79 MG/ML-% IV SOLN
500.0000 mg | Freq: Once | INTRAVENOUS | Status: AC
  Administered 2019-01-12: 500 mg via INTRAVENOUS
  Filled 2019-01-12: qty 100

## 2019-01-12 MED ORDER — ENOXAPARIN SODIUM 40 MG/0.4ML ~~LOC~~ SOLN
40.0000 mg | SUBCUTANEOUS | Status: DC
  Administered 2019-01-12: 40 mg via SUBCUTANEOUS
  Filled 2019-01-12: qty 0.4

## 2019-01-12 MED ORDER — SODIUM CHLORIDE 0.9 % IV BOLUS (SEPSIS)
1000.0000 mL | Freq: Once | INTRAVENOUS | Status: AC
  Administered 2019-01-12: 1000 mL via INTRAVENOUS

## 2019-01-12 MED ORDER — POTASSIUM CHLORIDE CRYS ER 20 MEQ PO TBCR
40.0000 meq | EXTENDED_RELEASE_TABLET | Freq: Once | ORAL | Status: AC
  Administered 2019-01-12: 40 meq via ORAL
  Filled 2019-01-12: qty 2

## 2019-01-12 MED ORDER — FENTANYL CITRATE (PF) 100 MCG/2ML IJ SOLN
50.0000 ug | Freq: Once | INTRAMUSCULAR | Status: AC
  Administered 2019-01-12: 50 ug via INTRAVENOUS
  Filled 2019-01-12: qty 2

## 2019-01-12 MED ORDER — FENTANYL CITRATE (PF) 100 MCG/2ML IJ SOLN
25.0000 ug | Freq: Once | INTRAMUSCULAR | Status: AC
  Administered 2019-01-12: 25 ug via INTRAVENOUS
  Filled 2019-01-12: qty 2

## 2019-01-12 MED ORDER — POLYETHYLENE GLYCOL 3350 17 G PO PACK
17.0000 g | PACK | Freq: Every day | ORAL | Status: DC
  Administered 2019-01-13 – 2019-01-14 (×2): 17 g via ORAL
  Filled 2019-01-12 (×2): qty 1

## 2019-01-12 MED ORDER — ONDANSETRON HCL 4 MG PO TABS: 4.0000 mg | ORAL_TABLET | Freq: Four times a day (QID) | ORAL | Status: DC | PRN

## 2019-01-12 MED ORDER — LORATADINE 10 MG PO TABS
10.0000 mg | ORAL_TABLET | Freq: Every day | ORAL | Status: DC
  Administered 2019-01-13 – 2019-01-14 (×2): 10 mg via ORAL
  Filled 2019-01-12 (×2): qty 1

## 2019-01-12 MED ORDER — OXYCODONE HCL 5 MG PO TABS
5.0000 mg | ORAL_TABLET | Freq: Four times a day (QID) | ORAL | Status: DC | PRN
  Administered 2019-01-12 – 2019-01-13 (×2): 5 mg via ORAL
  Filled 2019-01-12 (×2): qty 1

## 2019-01-12 MED ORDER — SODIUM CHLORIDE 0.9 % IV SOLN
INTRAVENOUS | Status: AC
  Administered 2019-01-12 – 2019-01-13 (×2): via INTRAVENOUS

## 2019-01-12 MED ORDER — FLUTICASONE PROPIONATE 50 MCG/ACT NA SUSP
1.0000 | Freq: Every day | NASAL | Status: DC
  Administered 2019-01-13 – 2019-01-14 (×2): 1 via NASAL
  Filled 2019-01-12: qty 16

## 2019-01-12 MED ORDER — ACETAMINOPHEN 325 MG PO TABS
650.0000 mg | ORAL_TABLET | Freq: Once | ORAL | Status: AC
  Administered 2019-01-12: 650 mg via ORAL
  Filled 2019-01-12: qty 2

## 2019-01-12 MED ORDER — ALBUTEROL SULFATE (2.5 MG/3ML) 0.083% IN NEBU: 2.5000 mg | INHALATION_SOLUTION | RESPIRATORY_TRACT | Status: DC | PRN

## 2019-01-12 MED ORDER — ACETAMINOPHEN 650 MG RE SUPP: 650.0000 mg | Freq: Four times a day (QID) | RECTAL | Status: DC | PRN

## 2019-01-12 MED ORDER — TAMSULOSIN HCL 0.4 MG PO CAPS
0.4000 mg | ORAL_CAPSULE | Freq: Every day | ORAL | Status: DC
  Administered 2019-01-13: 0.4 mg via ORAL
  Filled 2019-01-12: qty 1

## 2019-01-12 MED ORDER — IOHEXOL 300 MG/ML  SOLN
100.0000 mL | Freq: Once | INTRAMUSCULAR | Status: AC | PRN
  Administered 2019-01-12: 100 mL via INTRAVENOUS

## 2019-01-12 MED ORDER — PROMETHAZINE HCL 25 MG/ML IJ SOLN
6.2500 mg | Freq: Four times a day (QID) | INTRAMUSCULAR | Status: DC | PRN
  Administered 2019-01-13 (×2): 6.25 mg via INTRAVENOUS
  Filled 2019-01-12 (×2): qty 1

## 2019-01-12 NOTE — ED Triage Notes (Signed)
Pt reports RLQ pain since Monday night, pt seen at [facility] on Tuesday and told to come here if the pain got worse. Pt tearful in triage, HR 150s.

## 2019-01-12 NOTE — ED Provider Notes (Signed)
[facility] EMERGENCY DEPARTMENT Provider Note   CSN: [id number] Arrival date & time: 01/12/19  1642    History   Chief Complaint Chief Complaint  Patient presents with   Abdominal Pain    HPI Michelle [name] is a 28 y.o. female with past medical history of hysterectomy, appendectomy, hypertension, tachycardia, presenting to the emergency department with worsening right lower quadrant abdominal pain that began Monday.  Patient was seen at urgent care on Tuesday, diagnosed with possible kidney stone and was sent home with pain medication and Flomax.  She states the pain medication has not really been helping.  Her pain is been waxing and waning between dull and sharp.  It is worse with movement.  She has associated dysuria and cloudy urine.  She also has a feeling that she is not completely emptying her bladder.  She noticed fever today and worsening pain, therefore reports to the ED.  Denies associated nausea, vomiting, diarrhea, constipation.  She has no vaginal bleeding or discharge.     The history is provided by the patient.    Past Medical History:  Diagnosis Date   Anxiety    Asthma    Bipolar disorder ([place])    no current med.   Depression    no current med.   Family history of adverse reaction to anesthesia    states mother and sister are hard to wake up post-op   Gestational diabetes    History of seizure 06/20/2012   x 1 - during delivery of child(eclampsia)   Hypertension    Lipoma of lower back 08/2015   Migraine    PTSD (post-traumatic stress disorder)    PTSD (post-traumatic stress disorder)    Seizures ([facility])    Tachycardia    TMJ (dislocation of temporomandibular joint)    Vertigo     Patient Active Problem List   Diagnosis Date Noted   Sepsis ([place]) 01/12/2019   Eustachian tube dysfunction, bilateral 12/16/2018   Bee sting allergy 11/06/2018   Allergic reaction 11/04/2018   Mild intermittent asthma without  complication 81/82/9937   Chronic rhinitis 11/04/2018   Shortness of breath 10/15/2018   Chronic urticaria 10/04/2018   Rash and nonspecific skin eruption 11/16/2017   Easy bruising 11/16/2017   Anxiety state 10/27/2016   Migraine 02/10/2016   PTSD 03/02/2010   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    Past Surgical History:  Procedure Laterality Date   ABDOMINAL HYSTERECTOMY     ADENOIDECTOMY, TONSILLECTOMY AND MYRINGOTOMY WITH TUBE PLACEMENT  1990's   APPENDECTOMY     CESAREAN SECTION  06/20/2012   Procedure: CESAREAN SECTION;  Surgeon: Emily Filbert, MD;  Location: [facility];  Service: Obstetrics;  Laterality: N/A;   LAPAROSCOPIC APPENDECTOMY  07/25/2012   Procedure: APPENDECTOMY LAPAROSCOPIC;  Surgeon: Zenovia Jarred, MD;  Location: [facility];  Service: General;  Laterality: N/A;   LAPAROSCOPIC TUBAL LIGATION Bilateral 08/04/2014   Procedure: BILATERAL LAPAROSCOPIC TUBAL LIGATION;  Surgeon: Woodroe Mode, MD;  Location: [facility];  Service: Gynecology;  Laterality: Bilateral;   LIPOMA EXCISION N/A 09/16/2015   Procedure: EXCISION LIPOMA LUMBAR REGION;  Surgeon: Donnie Mesa, MD;  Location: [facility];  Service: General;  Laterality: N/A;   TONSILLECTOMY     WISDOM TOOTH EXTRACTION       OB History    Gravida  3   Para  3   Term  2   Preterm  1   AB  0   Living  3     SAB  0   TAB  0   Ectopic  0   Multiple  0   Live Births  1        Obstetric Comments  C-Section Indication: Non-reassuring fetal tracing, growth delay & marked proteinuria & Pre-Eclampsia Eclamptic Seizure during C-section delivery         Home Medications    Prior to Admission medications   Medication Sig Start Date End Date Taking? Authorizing Provider  albuterol (PROAIR HFA) 108 (90 Base) MCG/ACT inhaler Inhale 2 puffs into the lungs every 6 (six) hours as needed for wheezing or shortness of breath.    [provider]  cetirizine (ZYRTEC)  10 MG tablet Take 1 tablet (10 mg total) by mouth daily. 01/06/19   Garnet Sierras, DO  clindamycin-benzoyl peroxide (BENZACLIN) gel Apply 1 application topically at bedtime.  09/27/18   [provider]  diphenhydrAMINE HCl (BENADRYL ALLERGY PO) Take by mouth.    [provider]  doxycycline (MONODOX) 100 MG capsule Take 100 mg by mouth 2 (two) times daily. 09/27/18   [provider]  EPINEPHrine (EPIPEN 2-PAK) 0.3 mg/0.3 mL IJ SOAJ injection Inject 0.3 mLs (0.3 mg total) into the muscle once as needed for up to 1 dose for anaphylaxis. 11/04/18   Garnet Sierras, DO  fluticasone (FLONASE) 50 MCG/ACT nasal spray Place 1 spray into both nostrils daily. 01/06/19   Garnet Sierras, DO  HYDROcodone-acetaminophen (NORCO) 7.5-325 MG tablet Take 1 tablet by mouth every 6 (six) hours as needed for moderate pain. 01/07/19   Raylene Everts, MD  ibuprofen (ADVIL,MOTRIN) 200 MG tablet Take 800 mg by mouth every 6 (six) hours as needed (pain).    [provider]  ipratropium-albuterol (DUONEB) 0.5-2.5 (3) MG/3ML SOLN Take 3 mLs by nebulization every 4 (four) hours as needed. 10/13/18   Raylene Everts, MD  RETIN-A 0.025 % cream Apply 1 application topically See admin instructions. Apply a pea-sized amount to the face nightly 09/27/18   [provider]  tamsulosin (FLOMAX) 0.4 MG CAPS capsule Take 1 capsule (0.4 mg total) by mouth daily after supper. 01/07/19   Raylene Everts, MD  triamcinolone cream (KENALOG) 0.1 % Apply 1 application topically 2 (two) times daily. 10/19/18   Wieters, Elesa Hacker, PA-C    Family History Family History  Problem Relation Age of Onset   Breast cancer Paternal Grandmother    Colon cancer Paternal Grandfather    Colon cancer Maternal Grandfather    Colon polyps Maternal Aunt    Diabetes Mother    Anesthesia problems Mother        hard to wake up post-op   Diabetes Sister    Anesthesia problems Sister        hard to wake up post-op    Diabetes Father    Heart disease Father    Eczema Neg Hx    Allergic rhinitis Neg Hx    Asthma Neg Hx     Social History Social History   Tobacco Use   Smoking status: Former Smoker    Packs/day: 0.00    Years: 14.00    Pack years: 0.00    Types: Cigarettes    Quit date: 03/17/2017    Years since quitting: 1.8   Smokeless tobacco: Never Used  Substance Use Topics   Alcohol use: No   Drug use: No     Allergies   Adhesive [tape],  Bee venom, and Oatmeal   Review of Systems Review of Systems  Constitutional: Positive for fever.  Gastrointestinal: Positive for abdominal pain.  All other systems reviewed and are negative.    Physical Exam Updated Vital Signs BP 101/65    Pulse (!) 115    Temp 99.8 F (37.7 C) (Oral)    Resp 15    Ht 5\' 2"  (1.575 m)    Wt 65.8 kg    LMP 08/14/2016 (Exact Date)    SpO2 98%    BMI 26.52 kg/m   Physical Exam Vitals signs and nursing note reviewed.  Constitutional:      General: She is not in acute distress.    Appearance: She is well-developed.  HENT:     Head: Normocephalic and atraumatic.     Mouth/Throat:     Mouth: Mucous membranes are moist.  Eyes:     Conjunctiva/sclera: Conjunctivae normal.  Cardiovascular:     Rate and Rhythm: Regular rhythm. Tachycardia present.  Pulmonary:     Effort: Pulmonary effort is normal. No respiratory distress.     Breath sounds: Normal breath sounds.  Abdominal:     General: Abdomen is flat. Bowel sounds are normal.     Palpations: Abdomen is soft.     Tenderness: There is abdominal tenderness in the right lower quadrant and suprapubic area. There is no right CVA tenderness, left CVA tenderness, guarding or rebound.  Skin:    General: Skin is warm.     Capillary Refill: Capillary refill takes less than 2 seconds.  Neurological:     Mental Status: She is alert.  Psychiatric:        Behavior: Behavior normal.      ED Treatments / Results  Labs (all labs ordered are listed, but  only abnormal results are displayed) Labs Reviewed  LACTIC ACID, PLASMA - Abnormal; Notable for the following components:      Result Value   Lactic Acid, Venous 2.4 (*)    All other components within normal limits  COMPREHENSIVE METABOLIC PANEL - Abnormal; Notable for the following components:   Potassium 3.3 (*)    Glucose, Bld 105 (*)    All other components within normal limits  CBC WITH DIFFERENTIAL/PLATELET - Abnormal; Notable for the following components:   WBC 14.9 (*)    RDW 11.2 (*)    Neutro Abs 12.3 (*)    All other components within normal limits  URINALYSIS, ROUTINE W REFLEX MICROSCOPIC - Abnormal; Notable for the following components:   Color, Urine STRAW (*)    Hgb urine dipstick MODERATE (*)    Bacteria, UA RARE (*)    All other components within normal limits  SARS CORONAVIRUS 2 (HOSPITAL ORDER, [facility])  CULTURE, BLOOD (ROUTINE X 2)  CULTURE, BLOOD (ROUTINE X 2)  URINE CULTURE  LIPASE, BLOOD  LACTIC ACID, PLASMA    EKG EKG Interpretation  Date/Time:  Sunday January 12 2019 16:52:42 EDT Ventricular Rate:  143 PR Interval:    QRS Duration: 62 QT Interval:  327 QTC Calculation: 505 R Axis:   81 Text Interpretation:  Sinus tachycardia Consider right atrial enlargement Borderline repolarization abnormality Prolonged QT interval Since last tracing rate faster Confirmed by Noemi Chapel [phone]) on 01/12/2019 5:19:20 PM   Radiology Ct Abdomen Pelvis W Contrast  Result Date: 01/12/2019 CLINICAL DATA:  Right lower quadrant pain EXAM: CT ABDOMEN AND PELVIS WITH CONTRAST TECHNIQUE: Multidetector CT imaging of the abdomen and pelvis was performed  using the standard protocol following bolus administration of intravenous contrast. CONTRAST:  111mL OMNIPAQUE IOHEXOL 300 MG/ML  SOLN COMPARISON:  07/28/2016 FINDINGS: Lower chest: No acute abnormality. Hepatobiliary: No solid liver abnormality is seen. No gallstones, gallbladder wall thickening,  or biliary dilatation. Pancreas: Unremarkable. No pancreatic ductal dilatation or surrounding inflammatory changes. Spleen: Normal in size without significant abnormality. Adrenals/Urinary Tract: Adrenal glands are unremarkable. There are multiple small bilateral nonobstructive renal calculi, more numerous on the right. There is mild right hydronephrosis and hydroureter, however obstructing calculus is not seen to the level of the ureterovesical junction. Bladder is unremarkable. Stomach/Bowel: Stomach is within normal limits. Appendix is surgically absent. No evidence of bowel wall thickening, distention, or inflammatory changes. Vascular/Lymphatic: No significant vascular findings are present. No enlarged abdominal or pelvic lymph nodes. Reproductive: Status post hysterectomy. Crenulated corpus luteum of the right ovary. Other: No abdominal wall hernia or abnormality. Small volume free fluid in the low pelvis. Musculoskeletal: No acute or significant osseous findings. IMPRESSION: 1. There are multiple small bilateral nonobstructive renal calculi, more numerous on the right. There is mild right hydronephrosis and hydroureter, however obstructing calculus is not seen to the level of the ureterovesical junction. Findings may reflect recent passage of a calculus. 2.  Status post appendectomy. 3. Small volume nonspecific free fluid in the low pelvis, which may be functional in the reproductive age setting. Electronically Signed   By: Eddie Candle M.D.   On: 01/12/2019 18:55   Dg Chest Port 1 View  Result Date: 01/12/2019 CLINICAL DATA:  Fever.  Right lower quadrant pain. EXAM: PORTABLE CHEST 1 VIEW COMPARISON:  01/07/2019 FINDINGS: The heart size and mediastinal contours are within normal limits. Both lungs are clear. The visualized skeletal structures are unremarkable. IMPRESSION: No active disease. Electronically Signed   By: Marlaine Hind M.D.   On: 01/12/2019 20:00    Procedures Procedures (including  critical care time) CRITICAL CARE Performed by: [place] N Narcissa Melder   Total critical care time: 35 minutes  Critical care time was exclusive of separately billable procedures and treating other patients.  Critical care was necessary to treat or prevent imminent or life-threatening deterioration.  Critical care was time spent personally by me on the following activities: development of treatment plan with patient and/or surrogate as well as nursing, discussions with consultants, evaluation of patient's response to treatment, examination of patient, obtaining history from patient or surrogate, ordering and performing treatments and interventions, ordering and review of laboratory studies, ordering and review of radiographic studies, pulse oximetry and re-evaluation of patient's condition.  Medications Ordered in ED Medications  0.9 %  sodium chloride infusion (has no administration in time range)  sodium chloride 0.9 % bolus 1,000 mL (0 mLs Intravenous Stopped 01/12/19 1758)    And  sodium chloride 0.9 % bolus 1,000 mL (0 mLs Intravenous Stopped 01/12/19 1941)  acetaminophen (TYLENOL) tablet 650 mg (650 mg Oral Given 01/12/19 1718)  fentaNYL (SUBLIMAZE) injection 50 mcg (50 mcg Intravenous Given 01/12/19 1719)  iohexol (OMNIPAQUE) 300 MG/ML solution 100 mL (100 mLs Intravenous Contrast Given 01/12/19 1830)  cefTRIAXone (ROCEPHIN) 2 g in sodium chloride 0.9 % 100 mL IVPB (0 g Intravenous Stopped 01/12/19 1935)  metroNIDAZOLE (FLAGYL) IVPB 500 mg (500 mg Intravenous New Bag/Given 01/12/19 1939)  fentaNYL (SUBLIMAZE) injection 25 mcg (25 mcg Intravenous Given 01/12/19 1935)  potassium chloride SA (K-DUR) CR tablet 40 mEq (40 mEq Oral Given 01/12/19 2027)     Initial Impression / Assessment and Plan / ED Course  I have reviewed the triage vital signs and the nursing notes.  Pertinent labs & imaging results that were available during my care of the patient were reviewed by me and considered in my  medical decision making (see chart for details).        Patient presenting with worsening right lower quadrant abdominal pain since Monday, also new onset of fever today.  Treated at urgent care for suspected stone.  Patient has some associated urinary symptoms.  On exam, there is right lower quadrant slight suprapubic abdominal tenderness, no guarding or rebound.  No CVA tenderness.  Patient is febrile upon arrival at 101.3 F and very tachycardic at 152.  Code sepsis was initiated.  Fluid resuscitation, labs, and CT scan were ordered.  Labs with leukocytosis of 14.9.  Lactic acidosis of 2.4.  Urine without signs of infection.  CT scan revealing findings consistent with recently passed stone, however no other cause of her fever.  Add on portable chest x-ray and COVID test for possible source.  Heart rate is improving, however blood pressure remains low normal.  Will admit at this time for sepsis, of unclear origin.  Patient discussed with and evaluated by Dr. Sabra Heck.  The patient appears reasonably stabilized for admission considering the current resources, flow, and capabilities available in the ED at this time, and I doubt any other [facility] requiring further screening and/or treatment in the ED prior to admission.   Final Clinical Impressions(s) / ED Diagnoses   Final diagnoses:  Right nephrolithiasis  Sepsis without acute organ dysfunction, due to unspecified organism [facility])    ED Discharge Orders    None       Pama Roskos, [place] N, PA-C 01/12/19 2049    Noemi Chapel, MD 01/14/19 [phone]

## 2019-01-12 NOTE — ED Notes (Signed)
Date and time results received: 01/12/19 1754 (use smartphrase ".now" to insert current time)  Test: lactic acid Critical Value: 2.4  Name of Provider Notified: [place] robinson pa-c  Orders Received? Or Actions Taken?: Orders Received - See Orders for details

## 2019-01-12 NOTE — H&P (Addendum)
[facility] Admission History and Physical Service Pager: [phone]  Patient name: Michelle [name] Medical record number: [id number] Date of birth: 1991-06-04 Age: 28 y.o. Gender: female  Primary Care Provider: Rory Percy, DO Consultants: none Code Status: Full Preferred Emergency Contact: mom, [phone]  Chief Complaint: abdominal pain  Assessment and Plan: Michelle [name] is a 28 y.o. female presenting with right lower abdominal pain.  Past medical history significant for appendectomy,, bilateral tubal ligations, hysterectomy.  Sepsis likely secondary to UTI secondary to kidney stone She reports at least 1 week of some nephric and right lower quadrant pain with accompanied nausea.  She did not notice any fevers at home.  On admission she is found to be tachycardic to 130 and febrile to 0.3.  Her physical exam was remarkable for significant right lower quadrant tenderness to palpation.  Admission labs are notable for WBC 14.9, lactic acid 2.4, UA with moderate hemoglobin and microscopy with rare bacteria.  Abdominal CT showed multiple small bilateral nonobstructive renal calculi more on the right than left, mild hydronephrosis, consistent with recent passage of a calculus.  Differential for right lower quadrant pain includes kidney stones, ovarian torsion, ischemic colitis.  Uterine and appendiceal pathologies are excluded due to being status post appendectomy and hysterectomy.  Low suspicion for ovarian torsion based on normal appearance of ovaries on CT.  Low suspicion for bowel ischemia based on comfort, not peritoneal.  Most likely cause for discomfort at this time is a renal calculi, she may be passing an additional kidney stone.  There is evidence that she has recently passed a kidney stone. -Admit to [facility], attending Dr. Andria Frames -Normal saline 100 cc/h for 12 hours -Trend lactic acid to below 2 -oxycodone 5 mg every 6 hours as needed -Tylenol 650 every  6 hours as needed -Continue Flomax 0.4 mg daily -S/p ceftriaxone -S/p metronidazole -Phenergan as needed -MiraLAX - consider antibiotic de-escalation on 6/29  Allergies Home medication includes fluticasone and loratadine. -Continue fluticasone daily -Continue loratadine 10 mg daily  Asthma, chronic Albuterol inhaler used at home as needed.  Breathing comfortably on admission without evidence of wheezing. -Albuterol inhaler as needed  Hypokalemia Admission k 3.3. Given 40meq kdur x1. Repeat am cmp   Anxiety disorder Noted in chart. Does not appear to be on any medications for this. Patient calm with no anxiety during exam. - consider adding ssri if needed during hospitalization   FEN/GI: General diet Prophylaxis: Lovenox  Disposition: 1 to 2 days of hospitalization anticipated prior to discharge  History of Present Illness:  Michelle [name] is a 28 y.o. female presenting with right lower abdominal pain.  Past medical history significant for appendectomy,, bilateral tubal ligations, hysterectomy.  She first noted significant right lower abdominal pain starting the evening of 6/22.  Described as severe cramping without nausea.  On 6/23, she presented to urgent care where she was found to have blood in her urine.  She was given Flomax and oxycodone told that she was passing a stone that she should seek further care if she notices worsening of symptoms or fever.  Since she was seen at urgent care, she is continued to have significant right lower quadrant pain.  She reports that it improves with rest and applying gentle pressure to her right lower quadrant.  She reports that her oxycodone has not been helpful at home, she remains in significant discomfort.  She has experienced mild nausea related to her pain without vomiting.  She also  reports that she had been urinating frequently for the past several days although not presently, she also experiences the sensation of retaining urine  following a void.  She decided to present to the ED today due to worsening abdominal pain.  In the ED, she was found to be febrile and tachycardic with WBC 14.9 and a lactic acid 2.4.  Sepsis protocol was initiated and she was given ceftriaxone and Flagyl in addition to 2 L of normal saline.  Review Of Systems: Per HPI with the following additions:   Review of Systems  Constitutional: Negative for chills and fever.  Eyes: Negative for pain and discharge.  Respiratory: Negative for cough and hemoptysis.   Cardiovascular: Negative for chest pain and palpitations.  Gastrointestinal: Positive for constipation. Negative for abdominal pain, diarrhea, nausea and vomiting.  Genitourinary: Positive for dysuria (started 6/27).  Musculoskeletal: Negative for myalgias and neck pain.  Skin: Negative for itching and rash.  Neurological: Negative for dizziness and headaches. Focal weakness:    Psychiatric/Behavioral: Negative for depression and suicidal ideas.    Patient Active Problem List   Diagnosis Date Noted  . Sepsis ([place]) 01/12/2019  . Eustachian tube dysfunction, bilateral 12/16/2018  . Bee sting allergy 11/06/2018  . Allergic reaction 11/04/2018  . Mild intermittent asthma without complication 95/28/4132  . Chronic rhinitis 11/04/2018  . Shortness of breath 10/15/2018  . Chronic urticaria 10/04/2018  . Rash and nonspecific skin eruption 11/16/2017  . Easy bruising 11/16/2017  . Anxiety state 10/27/2016  . Migraine 02/10/2016  . PTSD 03/02/2010  . 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 Anxiety   . Asthma   . Bipolar disorder ([place])    no current med.  . Depression    no current med.  . Family history of adverse reaction to anesthesia    states mother and sister are hard to wake up post-op  . Gestational diabetes   . History of seizure 06/20/2012   x 1 - during delivery of child(eclampsia)  .  Hypertension   . Lipoma of lower back 08/2015  . Migraine   . PTSD (post-traumatic stress disorder)   . PTSD (post-traumatic stress disorder)   . Seizures ([place])   . Tachycardia   . TMJ (dislocation of temporomandibular joint)   . Vertigo     Past Surgical History: Past Surgical History:  Procedure Laterality Date  . ABDOMINAL HYSTERECTOMY    . ADENOIDECTOMY, TONSILLECTOMY AND MYRINGOTOMY WITH TUBE PLACEMENT  1990's  . APPENDECTOMY    . CESAREAN SECTION  06/20/2012   Procedure: CESAREAN SECTION;  Surgeon: Emily Filbert, MD;  Location: [facility];  Service: Obstetrics;  Laterality: N/A;  . LAPAROSCOPIC APPENDECTOMY  07/25/2012   Procedure: APPENDECTOMY LAPAROSCOPIC;  Surgeon: Zenovia Jarred, MD;  Location: [facility];  Service: General;  Laterality: N/A;  . LAPAROSCOPIC TUBAL LIGATION Bilateral 08/04/2014   Procedure: BILATERAL LAPAROSCOPIC TUBAL LIGATION;  Surgeon: Woodroe Mode, MD;  Location: [facility];  Service: Gynecology;  Laterality: Bilateral;  . LIPOMA EXCISION N/A 09/16/2015   Procedure: EXCISION LIPOMA LUMBAR REGION;  Surgeon: Donnie Mesa, MD;  Location: [facility];  Service: General;  Laterality: N/A;  . TONSILLECTOMY    . WISDOM TOOTH EXTRACTION      Social History: Social History   Tobacco Use  . Smoking status: Former Smoker    Packs/day: 0.00    Years: 14.00    Pack  years: 0.00    Types: Cigarettes    Quit date: 03/17/2017    Years since quitting: 1.8  . Smokeless tobacco: Never Used  Substance Use Topics  . Alcohol use: No  . Drug use: No   Please also refer to relevant sections of EMR.  Family History: Family History  Problem Relation Age of Onset  . Breast cancer Paternal Grandmother   . Colon cancer Paternal Grandfather   . Colon cancer Maternal Grandfather   . Colon polyps Maternal Aunt   . Diabetes Mother   . Anesthesia problems Mother        hard to wake up post-op  . Diabetes Sister   . Anesthesia problems Sister        hard to wake up  post-op  . Diabetes Father   . Heart disease Father   . Eczema Neg Hx   . Allergic rhinitis Neg Hx   . Asthma Neg Hx     Allergies and Medications: Allergies  Allergen Reactions  . Adhesive [Tape] Hives and Other (See Comments)    EKG or heart monitor pads- caused raised, red areas (WELTS) on the skin; "sensitive" pads fell off  . Bee Venom Hives and Other (See Comments)    Welts, also  . Oatmeal Hives    Hives from the waist down and had to seek medical treatment   No current facility-administered medications on file prior to encounter.    Current Outpatient Medications on File Prior to Encounter  Medication Sig Dispense Refill  . albuterol (PROAIR HFA) 108 (90 Base) MCG/ACT inhaler Inhale 2 puffs into the lungs every 6 (six) hours as needed for wheezing or shortness of breath.    . cetirizine (ZYRTEC) 10 MG tablet Take 1 tablet (10 mg total) by mouth daily. 30 tablet 5  . clindamycin-benzoyl peroxide (BENZACLIN) gel Apply 1 application topically at bedtime.     . diphenhydrAMINE HCl (BENADRYL ALLERGY PO) Take by mouth.    . doxycycline (MONODOX) 100 MG capsule Take 100 mg by mouth 2 (two) times daily.    Marland Kitchen EPINEPHrine (EPIPEN 2-PAK) 0.3 mg/0.3 mL IJ SOAJ injection Inject 0.3 mLs (0.3 mg total) into the muscle once as needed for up to 1 dose for anaphylaxis. 2 Device 2  . fluticasone (FLONASE) 50 MCG/ACT nasal spray Place 1 spray into both nostrils daily. 15.8 mL 5  . HYDROcodone-acetaminophen (NORCO) 7.5-325 MG tablet Take 1 tablet by mouth every 6 (six) hours as needed for moderate pain. 15 tablet 0  . ibuprofen (ADVIL,MOTRIN) 200 MG tablet Take 800 mg by mouth every 6 (six) hours as needed (pain).    Marland Kitchen ipratropium-albuterol (DUONEB) 0.5-2.5 (3) MG/3ML SOLN Take 3 mLs by nebulization every 4 (four) hours as needed. 360 mL 0  . RETIN-A 0.025 % cream Apply 1 application topically See admin instructions. Apply a pea-sized amount to the face nightly    . tamsulosin (FLOMAX) 0.4 MG  CAPS capsule Take 1 capsule (0.4 mg total) by mouth daily after supper. 14 capsule 0  . triamcinolone cream (KENALOG) 0.1 % Apply 1 application topically 2 (two) times daily. 30 g 0    Objective: BP 107/70   Pulse (!) 113   Temp 99.8 F (37.7 C) (Oral)   Resp (!) 23   Ht 5\' 2"  (1.575 m)   Wt 65.8 kg   LMP 08/14/2016 (Exact Date)   SpO2 98%   BMI 26.52 kg/m  Exam: Physical Exam Constitutional:      General:  She is not in acute distress.    Appearance: She is well-developed and normal weight. She is not ill-appearing, toxic-appearing or diaphoretic.  HENT:     Head: Normocephalic.  Cardiovascular:     Rate and Rhythm: Regular rhythm. Tachycardia present.     Heart sounds: Normal heart sounds.  Pulmonary:     Effort: Pulmonary effort is normal.     Breath sounds: Normal breath sounds.  Abdominal:     General: Abdomen is flat. Bowel sounds are normal.     Palpations: Abdomen is soft.     Tenderness: There is abdominal tenderness in the right lower quadrant.  Skin:    General: Skin is warm and dry.     Capillary Refill: Capillary refill takes less than 2 seconds.  Neurological:     General: No focal deficit present.     Mental Status: She is alert and oriented to person, place, and time.     Cranial Nerves: No cranial nerve deficit.  Psychiatric:        Mood and Affect: Mood normal.        Behavior: Behavior normal.      Labs and Imaging: CBC BMET  Recent Labs  Lab 01/12/19 1700  WBC 14.9*  HGB 14.0  HCT 41.5  PLT 177   Recent Labs  Lab 01/12/19 1700  NA 135  K 3.3*  CL 103  CO2 23  BUN 8  CREATININE 0.81  GLUCOSE 105*  CALCIUM 9.0     Ct Abdomen Pelvis W Contrast  Result Date: 01/12/2019 CLINICAL DATA:  Right lower quadrant pain EXAM: CT ABDOMEN AND PELVIS WITH CONTRAST TECHNIQUE: Multidetector CT imaging of the abdomen and pelvis was performed using the standard protocol following bolus administration of intravenous contrast. CONTRAST:  120mL  OMNIPAQUE IOHEXOL 300 MG/ML  SOLN COMPARISON:  07/28/2016 FINDINGS: Lower chest: No acute abnormality. Hepatobiliary: No solid liver abnormality is seen. No gallstones, gallbladder wall thickening, or biliary dilatation. Pancreas: Unremarkable. No pancreatic ductal dilatation or surrounding inflammatory changes. Spleen: Normal in size without significant abnormality. Adrenals/Urinary Tract: Adrenal glands are unremarkable. There are multiple small bilateral nonobstructive renal calculi, more numerous on the right. There is mild right hydronephrosis and hydroureter, however obstructing calculus is not seen to the level of the ureterovesical junction. Bladder is unremarkable. Stomach/Bowel: Stomach is within normal limits. Appendix is surgically absent. No evidence of bowel wall thickening, distention, or inflammatory changes. Vascular/Lymphatic: No significant vascular findings are present. No enlarged abdominal or pelvic lymph nodes. Reproductive: Status post hysterectomy. Crenulated corpus luteum of the right ovary. Other: No abdominal wall hernia or abnormality. Small volume free fluid in the low pelvis. Musculoskeletal: No acute or significant osseous findings. IMPRESSION: 1. There are multiple small bilateral nonobstructive renal calculi, more numerous on the right. There is mild right hydronephrosis and hydroureter, however obstructing calculus is not seen to the level of the ureterovesical junction. Findings may reflect recent passage of a calculus. 2.  Status post appendectomy. 3. Small volume nonspecific free fluid in the low pelvis, which may be functional in the reproductive age setting. Electronically Signed   By: Eddie Candle M.D.   On: 01/12/2019 18:55   Dg Chest Port 1 View  Result Date: 01/12/2019 CLINICAL DATA:  Fever.  Right lower quadrant pain. EXAM: PORTABLE CHEST 1 VIEW COMPARISON:  01/07/2019 FINDINGS: The heart size and mediastinal contours are within normal limits. Both lungs are clear.  The visualized skeletal structures are unremarkable. IMPRESSION: No active disease. Electronically Signed  By: Marlaine Hind M.D.   On: 01/12/2019 20:00   Dg Abd Acute W/chest  Result Date: 01/07/2019 CLINICAL DATA:  Acute right lower quadrant abdominal pain. EXAM: DG ABDOMEN ACUTE W/ 1V CHEST COMPARISON:  None. FINDINGS: There is no evidence of dilated bowel loops or free intraperitoneal air. No radiopaque calculi or other significant radiographic abnormality is seen. Heart size and mediastinal contours are within normal limits. Both lungs are clear. IMPRESSION: No evidence of bowel obstruction or ileus. No acute cardiopulmonary disease. Electronically Signed   By: Marijo Conception M.D.   On: 01/07/2019 15:22   EKG: Sinus tachycardia.  Matilde Haymaker, MD 01/12/2019, 9:13 PM PGY-1, [place] Intern pager: [phone], text pages welcome -------------------------------------------------------------------------------------------------------- Upper Level Addendum: I have seen and evaluated this patient along with Dr. Pilar Plate and reviewed the above note, making necessary revisions in blue.  Guadalupe Dawn MD PGY-2 Family Medicine Resident

## 2019-01-12 NOTE — ED Provider Notes (Signed)
Medical screening examination/treatment/procedure(s) were conducted as a shared visit with non-physician practitioner(s) and myself.  I personally evaluated the patient during the encounter.  Clinical Impression:   Final diagnoses:  Right nephrolithiasis  Sepsis without acute organ dysfunction, due to unspecified organism [facility])  Nephrolithiasis  Sepsis [facility])    The patient is a 28 year old female status post appendectomy as well as hysterectomy secondary to uterine fibroids.  She presents to the hospital today with a complaint of approximately 6 to 7 days worth of progressive discomfort mostly on the right side, initially seen at urgent care thought to have kidney stones because of hematuria.  She states that over the last 24 hours she has become acutely worse now having fevers nausea and right-sided abdominal pain.  On exam the patient is tachycardic, febrile, she appears to be somewhat ill and likely septic.  Her abdomen is soft except for the right upper quadrant and right mid abdomen which is tender but not peritoneal or guarding.  She has no CVA tenderness.  Would consider multiple different etiologies for sources of infection including cholecystitis, pyelonephritis, renal abscess, diverticulitis status post perforation.  Will need CT scan, code sepsis, fluids, antibiotics, patient agreeable.  This patient meets SIRS Criteria and may be septic. SIRS = Systemic Inflammatory Response Syndrome   The recent clinical data is shown below. Vitals:   01/13/19 0840 01/13/19 1250 01/13/19 1700 01/13/19 2056  BP:  (!) 88/55  (!) 92/54  Pulse:  86 (!) 115 89  Resp:    18  Temp: 98.4 F (36.9 C) 98.3 F (36.8 C)  98.2 F (36.8 C)  TempSrc: Oral Oral  Oral  SpO2:  98%  100%  Weight:      Height:       CRITICAL CARE Performed by: Johnna Acosta Total critical care time: *35 minutes Critical care time was exclusive of separately billable procedures and treating other patients. Critical  care was necessary to treat or prevent imminent or life-threatening deterioration. Critical care was time spent personally by me on the following activities: development of treatment plan with patient and/or surrogate as well as nursing, discussions with consultants, evaluation of patient's response to treatment, examination of patient, obtaining history from patient or surrogate, ordering and performing treatments and interventions, ordering and review of laboratory studies, ordering and review of radiographic studies, pulse oximetry and re-evaluation of patient's condition.   Noemi Chapel, MD 01/14/19 [phone]

## 2019-01-12 NOTE — Progress Notes (Signed)
Notified provider of need to order antibiotics.   

## 2019-01-13 ENCOUNTER — Other Ambulatory Visit (HOSPITAL_COMMUNITY): Payer: Medicaid Other

## 2019-01-13 ENCOUNTER — Encounter (HOSPITAL_COMMUNITY): Payer: Self-pay | Admitting: *Deleted

## 2019-01-13 DIAGNOSIS — N200 Calculus of kidney: Secondary | ICD-10-CM

## 2019-01-13 DIAGNOSIS — J45909 Unspecified asthma, uncomplicated: Secondary | ICD-10-CM

## 2019-01-13 LAB — COMPREHENSIVE METABOLIC PANEL
ALT: 11 U/L (ref 0–44)
AST: 14 U/L — ABNORMAL LOW (ref 15–41)
Albumin: 3 g/dL — ABNORMAL LOW (ref 3.5–5.0)
Alkaline Phosphatase: 37 U/L — ABNORMAL LOW (ref 38–126)
Anion gap: 11 (ref 5–15)
BUN: 6 mg/dL (ref 6–20)
CO2: 18 mmol/L — ABNORMAL LOW (ref 22–32)
Calcium: 7.9 mg/dL — ABNORMAL LOW (ref 8.9–10.3)
Chloride: 108 mmol/L (ref 98–111)
Creatinine, Ser: 0.72 mg/dL (ref 0.44–1.00)
GFR calc Af Amer: >60 mL/min (ref >60)
GFR calc non Af Amer: >60 mL/min (ref >60)
Glucose, Bld: 106 mg/dL — ABNORMAL HIGH (ref 70–99)
Potassium: 3.9 mmol/L (ref 3.5–5.1)
Sodium: 137 mmol/L (ref 135–145)
Total Bilirubin: 0.3 mg/dL (ref 0.3–1.2)
Total Protein: 5.3 g/dL — ABNORMAL LOW (ref 6.5–8.1)

## 2019-01-13 LAB — CBC WITH DIFFERENTIAL/PLATELET
Abs Immature Granulocytes: 0.05 10*3/uL (ref 0.00–0.07)
Basophils Absolute: 0 10*3/uL (ref 0.0–0.1)
Basophils Relative: 0 %
Eosinophils Absolute: 0 10*3/uL (ref 0.0–0.5)
Eosinophils Relative: 0 %
HCT: 34.7 % — ABNORMAL LOW (ref 36.0–46.0)
Hemoglobin: 11.7 g/dL — ABNORMAL LOW (ref 12.0–15.0)
Immature Granulocytes: 0 %
Lymphocytes Relative: 15 %
Lymphs Abs: 1.9 10*3/uL (ref 0.7–4.0)
MCH: 31.5 pg (ref 26.0–34.0)
MCHC: 33.7 g/dL (ref 30.0–36.0)
MCV: 93.5 fL (ref 80.0–100.0)
Monocytes Absolute: 1.2 10*3/uL — ABNORMAL HIGH (ref 0.1–1.0)
Monocytes Relative: 10 %
Neutro Abs: 9.5 10*3/uL — ABNORMAL HIGH (ref 1.7–7.7)
Neutrophils Relative %: 75 %
Platelets: 157 10*3/uL (ref 150–400)
RBC: 3.71 MIL/uL — ABNORMAL LOW (ref 3.87–5.11)
RDW: 11.5 % (ref 11.5–15.5)
WBC: 12.7 10*3/uL — ABNORMAL HIGH (ref 4.0–10.5)
nRBC: 0 % (ref 0.0–0.2)

## 2019-01-13 LAB — LACTIC ACID, PLASMA: Lactic Acid, Venous: 1.1 mmol/L (ref 0.5–1.9)

## 2019-01-13 LAB — HIV ANTIBODY (ROUTINE TESTING W REFLEX): HIV Screen 4th Generation wRfx: NONREACTIVE

## 2019-01-13 MED ORDER — ACETAMINOPHEN 325 MG PO TABS
650.0000 mg | ORAL_TABLET | Freq: Four times a day (QID) | ORAL | Status: DC
  Administered 2019-01-13 – 2019-01-14 (×5): 650 mg via ORAL
  Filled 2019-01-13 (×5): qty 2

## 2019-01-13 MED ORDER — OXYCODONE HCL 5 MG PO TABS
5.0000 mg | ORAL_TABLET | ORAL | Status: DC | PRN
  Administered 2019-01-13 – 2019-01-14 (×4): 5 mg via ORAL
  Filled 2019-01-13 (×4): qty 1

## 2019-01-13 MED ORDER — ENOXAPARIN SODIUM 40 MG/0.4ML ~~LOC~~ SOLN: 40.0000 mg | SUBCUTANEOUS | Status: DC

## 2019-01-13 MED ORDER — SODIUM CHLORIDE 0.9 % IV SOLN
1.0000 g | INTRAVENOUS | Status: DC
  Administered 2019-01-13: 1 g via INTRAVENOUS
  Filled 2019-01-13: qty 10
  Filled 2019-01-13: qty 1

## 2019-01-13 MED ORDER — ENOXAPARIN SODIUM 40 MG/0.4ML ~~LOC~~ SOLN
40.0000 mg | SUBCUTANEOUS | Status: DC
  Administered 2019-01-13: 40 mg via SUBCUTANEOUS
  Filled 2019-01-13: qty 0.4

## 2019-01-13 NOTE — Progress Notes (Addendum)
Family Medicine Teaching Service Daily Progress Note Intern Pager: [phone]  Patient name: Michelle [name] Medical record number: [id number] Date of birth: 02-Jan-1991 Age: 28 y.o. Gender: female  Primary Care Provider: Rory Percy, DO Consultants: None Code Status: Full  Pt Overview and Major Events to Date:  6/28 Admitted to FPTS  Assessment and Plan: Michelle [name] is a 28 y.o. female presenting with right lower abdominal pain.  Past medical history significant for appendectomy,, bilateral tubal ligations, hysterectomy.  Urosepsis likely secondary to obstructing nephrolithiasis CT abdomen pelvis showing multiple nonobstructive renal calculi on right greater than left, mild hydronephrosis, likely recent passage of calculus.  Also noted small amount of free fluid in pelvis, likely physiologic.  UA with moderate hemoglobin and rare bacteria.  Urine culture pending.  LA 2.4>2.1 with fluids.  WBC 14.9>12.7.  Creatinine remained stable at 0.72.  Patient febrile this a.m. to 100.5 at 0400.  Remains mildly tachycardic, 108 this AM.  BP stable with map of 73 this a.m.  On exam this a.m. tenderness to palpation of right lower quadrant, also right upper quadrant.  No CVA tenderness.  Patient had normal LFTs and bilirubin on admission, therefore doubt gallbladder etiology. -Normal saline 100 cc/h -Trend lactic acid to below 2 -oxy 5mg  q4h prn -Tylenol 650 every 6 hours scheduled -Continue Flomax 0.4 mg daily -cont CTX (6/28- ) -d/c flagyl -Phenergan as needed -MiraLAX - repeat renal US in AM, if hydro persistent, will consult urology  Allergies Home medication includes fluticasone and loratadine. -Continue fluticasone daily -Continue loratadine 10 mg daily  Asthma, chronic Albuterol inhaler used at home as needed.  Breathing comfortably on admission without evidence of wheezing. -Albuterol inhaler as needed  Hypokalemia: Resolved K3.9 this a.m. -Continue to monitor BMP  Anxiety  disorder Noted in chart. Does not appear to be on any medications for this. Patient calm with no anxiety during exam. - consider adding ssri if needed during hospitalization   FEN/GI: Regular PPx: Lovenox  Disposition: pending clinical improvement  Subjective:  Patient notes that her pain is mildly improved this morning.  States that pain medication has been helping.  States that she has not been able to eat very much due to pain.  Notes that pain worsens with urination, movement, eating.  Denies other complaints at this time.  Objective: Temp:  [99 F (37.2 C)-101.3 F (38.5 C)] 100.5 F (38.1 C) (06/29 0451) Pulse Rate:  [108-152] 108 (06/29 0451) Resp:  [13-23] 18 (06/29 0451) BP: (98-130)/(61-78) 98/61 (06/29 0451) SpO2:  [97 %-100 %] 99 % (06/29 0451) Weight:  [65.8 kg] 65.8 kg (06/28 1651)  Physical Exam:  General: 28 y.o. female in NAD Cardio: RRR no m/r/g Lungs: CTAB, no wheezing, no rhonchi, no crackles, no IWOB on RA Abdomen: Soft, tenderness to deep palpation of right lower quadrant, right upper quadrant, no CVA tenderness really Skin: warm and dry Extremities: No edema   Laboratory: Recent Labs  Lab 01/07/19 1550 01/12/19 1700 01/13/19 0250  WBC 12.9* 14.9* 12.7*  HGB 13.9 14.0 11.7*  HCT 40.7 41.5 34.7*  PLT 200 177 157   Recent Labs  Lab 01/12/19 1700 01/13/19 0250  NA 135 137  K 3.3* 3.9  CL 103 108  CO2 23 18*  BUN 8 6  CREATININE 0.81 0.72  CALCIUM 9.0 7.9*  PROT 7.3 5.3*  BILITOT 0.8 0.3  ALKPHOS 52 37*  ALT 15 11  AST 17 14*  GLUCOSE 105* 106*     Imaging/Diagnostic Tests:  Ct Abdomen Pelvis W Contrast  Result Date: 01/12/2019 CLINICAL DATA:  Right lower quadrant pain EXAM: CT ABDOMEN AND PELVIS WITH CONTRAST TECHNIQUE: Multidetector CT imaging of the abdomen and pelvis was performed using the standard protocol following bolus administration of intravenous contrast. CONTRAST:  154mL OMNIPAQUE IOHEXOL 300 MG/ML  SOLN COMPARISON:   07/28/2016 FINDINGS: Lower chest: No acute abnormality. Hepatobiliary: No solid liver abnormality is seen. No gallstones, gallbladder wall thickening, or biliary dilatation. Pancreas: Unremarkable. No pancreatic ductal dilatation or surrounding inflammatory changes. Spleen: Normal in size without significant abnormality. Adrenals/Urinary Tract: Adrenal glands are unremarkable. There are multiple small bilateral nonobstructive renal calculi, more numerous on the right. There is mild right hydronephrosis and hydroureter, however obstructing calculus is not seen to the level of the ureterovesical junction. Bladder is unremarkable. Stomach/Bowel: Stomach is within normal limits. Appendix is surgically absent. No evidence of bowel wall thickening, distention, or inflammatory changes. Vascular/Lymphatic: No significant vascular findings are present. No enlarged abdominal or pelvic lymph nodes. Reproductive: Status post hysterectomy. Crenulated corpus luteum of the right ovary. Other: No abdominal wall hernia or abnormality. Small volume free fluid in the low pelvis. Musculoskeletal: No acute or significant osseous findings. IMPRESSION: 1. There are multiple small bilateral nonobstructive renal calculi, more numerous on the right. There is mild right hydronephrosis and hydroureter, however obstructing calculus is not seen to the level of the ureterovesical junction. Findings may reflect recent passage of a calculus. 2.  Status post appendectomy. 3. Small volume nonspecific free fluid in the low pelvis, which may be functional in the reproductive age setting. Electronically Signed   By: Eddie Candle M.D.   On: 01/12/2019 18:55   Dg Chest Port 1 View  Result Date: 01/12/2019 CLINICAL DATA:  Fever.  Right lower quadrant pain. EXAM: PORTABLE CHEST 1 VIEW COMPARISON:  01/07/2019 FINDINGS: The heart size and mediastinal contours are within normal limits. Both lungs are clear. The visualized skeletal structures are  unremarkable. IMPRESSION: No active disease. Electronically Signed   By: Marlaine Hind M.D.   On: 01/12/2019 20:00    Meccariello, Bernita Raisin, DO 01/13/2019, 8:13 AM PGY-1, [place] Intern pager: [phone], text pages welcome

## 2019-01-13 NOTE — Discharge Summary (Addendum)
[facility] Discharge Summary  Patient name: Michelle [name] Medical record number: [id number] Date of birth: 04/19/1991 Age: 28 y.o. Gender: female Date of Admission: 01/12/2019  Date of Discharge: 01/14/2019 Admitting Physician: Michelle Resides, MD  Primary Care Provider: Rory Percy, DO Consultants: None  Indication for Hospitalization: urosepsis  Discharge Diagnoses/Problem List:  Urosepsis likely secondary to obstructing nephrolithiasis Allergies Asthma Anxiety disorder  Disposition: home  Discharge Condition: stable, improved  Discharge Exam:   Physical Exam:  General: 28 y.o. female in NAD Cardio: RRR no m/r/g Lungs: CTAB, no wheezing, no rhonchi, no crackles, no IWOB on RA Abdomen: mild RLQ pain with right CVA Skin: warm and dry Extremities: No edema  Brief Hospital Course:  Michelle [name] a 28 y.o.femalepresenting with right lower abdominal pain. Past medical history significant for appendectomy,, bilateral tubal ligations, hysterectomy.  Her hospital course is outlined below.  Urosepsis 2/2 nephrolithiasis  Patient presented with RLQ pain and hematuria after being diagnosed with nephrolithiasis at urgent care 5 days prior.  Admission details can be found in H&P.  Patient was initially febrile on admission to 101.3, tachycardic to 130s, lactic acid elevated to 2.4, and WBC elevated to 14.9.  Given this, patient met sepsis criteria.  UA was positive for moderate hemoglobin and rare bacteria.  CT abd/pelvis was performed showing multiple small bilateral nonobstructive renal calculi more on the right than left, mild hydronephrosis, consistent with recent passage of a calculus.  Patient also with right CVA tenderness on exam.  Given this, patient started on IV CTX and flagyl in ED on 6/28.  On 6/29, patient's lactic acidosis had resolved with fluids, vitals were improved, and flagyl was discontinued.  On 6/30, she was transitioned to oral  antibiotics.  Urine culture showed 40,000 colonies Klebsiella with resistance to ampicillin and nitrofurantoin.  Repeat renal ultrasound showed resolution of hydronephrosis on right.  Vitals continued to improve and patient's pain was well-controlled.  At the time of discharge, patient's vitals were within normal limits, and she had been afebrile >24 hrs.  She was discharged home to complete a 14 day course of keflex.  Of note, renal ultrasound also noted bilateral echogenic medullae with less extensive calcification by CT, compatible with medullary sponge kidney.  This could place patient at an increased risk of UTIs in the long-term and nephrolithiasis.  Consider urology/nephrology referral as outpatient.  Issues for Follow Up:  1. She was discharged with Keflex to complete a 14 day course of antibiotics.  Please ensure completion on 7/11. 2. Patient was discharged with instructions to continue flomax. If pain improved, consider discontinuation. 3. Possible medullary kidneys noted on renal US.  See above.  Significant Procedures: None  Significant Labs and Imaging:  Recent Labs  Lab 01/12/19 1700 01/13/19 0250 01/14/19 0306  WBC 14.9* 12.7* 10.2  HGB 14.0 11.7* 10.4*  HCT 41.5 34.7* 31.7*  PLT 177 157 156   Recent Labs  Lab 01/12/19 1700 01/13/19 0250 01/14/19 0306  NA 135 137 137  K 3.3* 3.9 3.8  CL 103 108 109  CO2 23 18* 21*  GLUCOSE 105* 106* 101*  BUN 8 6 <5*  CREATININE 0.81 0.72 0.63  CALCIUM 9.0 7.9* 7.8*  ALKPHOS 52 37*  --   AST 17 14*  --   ALT 15 11  --   ALBUMIN 4.3 3.0*  --    Renal US 6/30 1. Resolved right hydronephrosis.  Negative for renal abscess. 2. Bilateral echogenic medullae with  less extensive calcification by CT, compatible with medullary sponge kidney.   Ct Abdomen Pelvis W Contrast  Result Date: 01/12/2019 CLINICAL DATA:  Right lower quadrant pain EXAM: CT ABDOMEN AND PELVIS WITH CONTRAST TECHNIQUE: Multidetector CT imaging of the abdomen  and pelvis was performed using the standard protocol following bolus administration of intravenous contrast. CONTRAST:  17m OMNIPAQUE IOHEXOL 300 MG/ML  SOLN COMPARISON:  07/28/2016 FINDINGS: Lower chest: No acute abnormality. Hepatobiliary: No solid liver abnormality is seen. No gallstones, gallbladder wall thickening, or biliary dilatation. Pancreas: Unremarkable. No pancreatic ductal dilatation or surrounding inflammatory changes. Spleen: Normal in size without significant abnormality. Adrenals/Urinary Tract: Adrenal glands are unremarkable. There are multiple small bilateral nonobstructive renal calculi, more numerous on the right. There is mild right hydronephrosis and hydroureter, however obstructing calculus is not seen to the level of the ureterovesical junction. Bladder is unremarkable. Stomach/Bowel: Stomach is within normal limits. Appendix is surgically absent. No evidence of bowel wall thickening, distention, or inflammatory changes. Vascular/Lymphatic: No significant vascular findings are present. No enlarged abdominal or pelvic lymph nodes. Reproductive: Status post hysterectomy. Crenulated corpus luteum of the right ovary. Other: No abdominal wall hernia or abnormality. Small volume free fluid in the low pelvis. Musculoskeletal: No acute or significant osseous findings. IMPRESSION: 1. There are multiple small bilateral nonobstructive renal calculi, more numerous on the right. There is mild right hydronephrosis and hydroureter, however obstructing calculus is not seen to the level of the ureterovesical junction. Findings may reflect recent passage of a calculus. 2.  Status post appendectomy. 3. Small volume nonspecific free fluid in the low pelvis, which may be functional in the reproductive age setting. Electronically Signed   By: AEddie CandleM.D.   On: 01/12/2019 18:55   Dg Chest Port 1 View  Result Date: 01/12/2019 CLINICAL DATA:  Fever.  Right lower quadrant pain. EXAM: PORTABLE CHEST 1  VIEW COMPARISON:  01/07/2019 FINDINGS: The heart size and mediastinal contours are within normal limits. Both lungs are clear. The visualized skeletal structures are unremarkable. IMPRESSION: No active disease. Electronically Signed   By: JMarlaine HindM.D.   On: 01/12/2019 20:00   Results/Tests Pending at Time of Discharge: None  Discharge Medications:  Allergies as of 01/14/2019      Reactions   Adhesive [tape] Hives, Other (See Comments)   EKG or heart monitor pads- caused raised, red areas (WELTS) on the skin; "sensitive" pads fell off   Bee Venom Hives, Other (See Comments)   Welts, also   Oatmeal Hives   Hives from the waist down and had to seek medical treatment      Medication List    STOP taking these medications   clindamycin-benzoyl peroxide gel Commonly known as: BENZACLIN   HYDROcodone-acetaminophen 7.5-325 MG tablet Commonly known as: Norco     TAKE these medications   cephALEXin 500 MG capsule Commonly known as: KEFLEX Take 1 capsule (500 mg total) by mouth every 6 (six) hours for 12 days.   cetirizine 10 MG tablet Commonly known as: ZYRTEC Take 1 tablet (10 mg total) by mouth daily.   EPINEPHrine 0.3 mg/0.3 mL Soaj injection Commonly known as: EpiPen 2-Pak Inject 0.3 mLs (0.3 mg total) into the muscle once as needed for up to 1 dose for anaphylaxis.   fluticasone 50 MCG/ACT nasal spray Commonly known as: FLONASE Place 1 spray into both nostrils daily.   ipratropium-albuterol 0.5-2.5 (3) MG/3ML Soln Commonly known as: DUONEB Take 3 mLs by nebulization every 4 (four) hours as needed.  oxyCODONE 5 MG immediate release tablet Commonly known as: Oxy IR/ROXICODONE Take 1 tablet (5 mg total) by mouth every 6 (six) hours as needed for severe pain or breakthrough pain.   polyethylene glycol 17 g packet Commonly known as: MIRALAX / GLYCOLAX Take 17 g by mouth daily. Start taking on: January 15, 2019   ProAir HFA 108 (90 Base) MCG/ACT inhaler Generic drug:  albuterol Inhale 2 puffs into the lungs every 6 (six) hours as needed for wheezing or shortness of breath.   Retin-A 0.025 % cream Generic drug: tretinoin Apply 1 application topically See admin instructions. Apply a pea-sized amount to the face nightly   senna 8.6 MG Tabs tablet Commonly known as: SENOKOT Take 1 tablet (8.6 mg total) by mouth daily as needed for mild constipation.   tamsulosin 0.4 MG Caps capsule Commonly known as: FLOMAX Take 1 capsule (0.4 mg total) by mouth daily after supper.   triamcinolone cream 0.1 % Commonly known as: KENALOG Apply 1 application topically 2 (two) times daily.       Discharge Instructions: Please refer to Patient Instructions section of EMR for full details.  Patient was counseled important signs and symptoms that should prompt return to medical care, changes in medications, dietary instructions, activity restrictions, and follow up appointments.   Follow-Up Appointments: Follow-up Information    Bonnita Hollow, MD. Go on 01/16/2019.   Specialty: Family Medicine Why: at 9:50 am Contact information: [address] [place] [address] [phone]           Cleophas Dunker, DO 01/14/2019, 12:51 PM PGY-1, [place]

## 2019-01-14 ENCOUNTER — Inpatient Hospital Stay (HOSPITAL_COMMUNITY): Payer: Medicaid Other

## 2019-01-14 LAB — CBC
HCT: 31.7 % — ABNORMAL LOW (ref 36.0–46.0)
Hemoglobin: 10.4 g/dL — ABNORMAL LOW (ref 12.0–15.0)
MCH: 31 pg (ref 26.0–34.0)
MCHC: 32.8 g/dL (ref 30.0–36.0)
MCV: 94.3 fL (ref 80.0–100.0)
Platelets: 156 10*3/uL (ref 150–400)
RBC: 3.36 MIL/uL — ABNORMAL LOW (ref 3.87–5.11)
RDW: 11.4 % — ABNORMAL LOW (ref 11.5–15.5)
WBC: 10.2 10*3/uL (ref 4.0–10.5)
nRBC: 0 % (ref 0.0–0.2)

## 2019-01-14 LAB — BASIC METABOLIC PANEL
Anion gap: 7 (ref 5–15)
BUN: <5 mg/dL — ABNORMAL LOW (ref 6–20)
CO2: 21 mmol/L — ABNORMAL LOW (ref 22–32)
Calcium: 7.8 mg/dL — ABNORMAL LOW (ref 8.9–10.3)
Chloride: 109 mmol/L (ref 98–111)
Creatinine, Ser: 0.63 mg/dL (ref 0.44–1.00)
GFR calc Af Amer: >60 mL/min (ref >60)
GFR calc non Af Amer: >60 mL/min (ref >60)
Glucose, Bld: 101 mg/dL — ABNORMAL HIGH (ref 70–99)
Potassium: 3.8 mmol/L (ref 3.5–5.1)
Sodium: 137 mmol/L (ref 135–145)

## 2019-01-14 LAB — URINE CULTURE: Culture: 40000 — AB

## 2019-01-14 MED ORDER — SENNA 8.6 MG PO TABS: 1.0000 | ORAL_TABLET | Freq: Every day | ORAL | 0 refills | Status: DC | PRN

## 2019-01-14 MED ORDER — OXYCODONE HCL 5 MG PO TABS: 5.0000 mg | ORAL_TABLET | Freq: Four times a day (QID) | ORAL | 0 refills | Status: DC | PRN

## 2019-01-14 MED ORDER — CEPHALEXIN 500 MG PO CAPS: 500.0000 mg | ORAL_CAPSULE | Freq: Four times a day (QID) | ORAL | 0 refills | Status: DC

## 2019-01-14 MED ORDER — POLYETHYLENE GLYCOL 3350 17 G PO PACK: 17.0000 g | PACK | Freq: Every day | ORAL | 0 refills | Status: DC

## 2019-01-14 MED ORDER — CEPHALEXIN 500 MG PO CAPS: 500.0000 mg | ORAL_CAPSULE | Freq: Four times a day (QID) | ORAL | 0 refills | Status: AC

## 2019-01-14 MED ORDER — OXYCODONE HCL 5 MG PO TABS
5.0000 mg | ORAL_TABLET | Freq: Four times a day (QID) | ORAL | Status: DC | PRN
  Administered 2019-01-14: 5 mg via ORAL
  Filled 2019-01-14: qty 1

## 2019-01-14 MED ORDER — SENNA 8.6 MG PO TABS
1.0000 | ORAL_TABLET | Freq: Every day | ORAL | Status: DC | PRN
  Administered 2019-01-14: 8.6 mg via ORAL
  Filled 2019-01-14: qty 1

## 2019-01-14 MED ORDER — CEPHALEXIN 500 MG PO CAPS: 500.0000 mg | ORAL_CAPSULE | Freq: Four times a day (QID) | ORAL | Status: DC

## 2019-01-14 MED FILL — SENNA 8.6 MG TABS: 8.6 | 30 days supply | Qty: 30 | Fill #0

## 2019-01-14 MED FILL — oxyCODONE HCL 5 MG TABS: 5 | 1 days supply | Qty: 4 | Fill #0

## 2019-01-14 MED FILL — POLYETHYLENE GLYCOL 3350 PO: 17 | 14 days supply | Qty: 238 | Fill #0

## 2019-01-14 MED FILL — CEPHALEXIN 500 MG CAPSULE: 500 | 12 days supply | Qty: 48 | Fill #0

## 2019-01-14 NOTE — Progress Notes (Signed)
Patient discharged to home. Verbalized understanding of all discharge instructions including discharge medications and follow up MD visits. 

## 2019-01-14 NOTE — Progress Notes (Signed)
Family Medicine Teaching Service Daily Progress Note Intern Pager: [phone]  Patient name: Michelle [name] Medical record number: [id number] Date of birth: [birth year] Age: 28 y.o. Gender: female  Primary Care Provider: Rory Percy, DO Consultants: None Code Status: Full  Pt Overview and Major Events to Date:  6/28 Admitted to FPTS  Assessment and Plan: Michelle [name] is a 28 y.o. female presenting with right lower abdominal pain.  Past medical history significant for appendectomy,, bilateral tubal ligations, hysterectomy.  Urosepsis likely secondary to obstructing nephrolithiasis Initial note of hydronephrosis on CT abdomen and pelvis on admission.  Repeat renal ultrasound today with resolution of hydronephrosis on right.  Patient received second dose of ceftriaxone on 6/29.  Urine culture results with 40,000 colonies Klebsiella, resistant to ampicillin and nitrofurantoin.  Lactic acidosis resolved on 6/29 with fluid administration, WBC improved to 10.2 from 12.7 this a.m.  She has remained afebrile for the last 24 hours.  BP mildly hypotensive overnight, this a.m. vitals pending.  Tachycardia resolved, this a.m. on exam, was 80.  Received 25 mg total PRN oxygen last 24 hours. -oxy 5mg  q4h prn, decrease to q6h prn -Tylenol 650 every 6 hours scheduled -Continue Flomax 0.4 mg daily -d/c CTX (6/28-6/29) - cont keflex for 14 days total -d/c flagyl -Phenergan as needed -MiraLAX  Allergies Home medication includes fluticasone and loratadine. -Continue fluticasone daily -Continue loratadine 10 mg daily  Asthma, chronic Albuterol inhaler used at home as needed.  Breathing comfortably on admission without evidence of wheezing. -Albuterol inhaler as needed  Anxiety disorder Noted in chart. Does not appear to be on any medications for this. Patient calm with no anxiety during exam. - consider adding ssri if needed during hospitalization   FEN/GI: Regular PPx:  Lovenox  Disposition: home today vs tomorrow pending ability to tolerate PO antibiotics  Subjective:  Patient notes that her pain has continued to improve, but worsens with movement and urination.  States that she would like to go home today.  Overall she feels improved from admission.  Objective: Temp:  [98.2 F (36.8 C)-98.4 F (36.9 C)] 98.2 F (36.8 C) (06/29 2056) Pulse Rate:  [86-115] 89 (06/29 2056) Resp:  [18] 18 (06/29 2056) BP: (88-92)/(54-55) 92/54 (06/29 2056) SpO2:  [98 %-100 %] 100 % (06/29 2056)  Physical Exam:  General: 28 y.o. female in NAD Cardio: RRR no m/r/g Lungs: CTAB, no wheezing, no rhonchi, no crackles, no IWOB on RA Abdomen: mild RLQ pain with right CVA Skin: warm and dry Extremities: No edema    Laboratory: Recent Labs  Lab 01/12/19 1700 01/13/19 0250 01/14/19 0306  WBC 14.9* 12.7* 10.2  HGB 14.0 11.7* 10.4*  HCT 41.5 34.7* 31.7*  PLT 177 157 156   Recent Labs  Lab 01/12/19 1700 01/13/19 0250 01/14/19 0306  NA 135 137 137  K 3.3* 3.9 3.8  CL 103 108 109  CO2 23 18* 21*  BUN 8 6 <5*  CREATININE 0.81 0.72 0.63  CALCIUM 9.0 7.9* 7.8*  PROT 7.3 5.3*  --   BILITOT 0.8 0.3  --   ALKPHOS 52 37*  --   ALT 15 11  --   AST 17 14*  --   GLUCOSE 105* 106* 101*     Imaging/Diagnostic Tests: US Renal  Result Date: 01/14/2019 CLINICAL DATA:  Nephrolithiasis and sepsis EXAM: RENAL / URINARY TRACT ULTRASOUND COMPLETE COMPARISON:  Abdominal CT 2 days ago FINDINGS: Right Kidney: Renal measurements: 12.8 x 5 x 5.3 cm = volume: 174 mL.  This asymmetric enlargement is attributed to pyelonephritis by history and CT. Resolved hydronephrosis. Diffusely echogenic medullae. Negative for abscess. Left Kidney: Renal measurements: 10 x 4 x 5 cm = volume: 105 mL. Diffusely echogenic medullae. No hydronephrosis. Bladder: Appears normal for degree of bladder distention. Both ureteral jets are seen. IMPRESSION: 1. Resolved right hydronephrosis.  Negative for  renal abscess. 2. Bilateral echogenic medullae with less extensive calcification by CT, compatible with medullary sponge kidney. Electronically Signed   By: Monte Fantasia M.D.   On: 01/14/2019 04:29    Janna Oak, Bernita Raisin, DO 01/14/2019, 8:19 AM PGY-1, [place] Intern pager: [phone], text pages welcome

## 2019-01-14 NOTE — Discharge Instructions (Signed)
Continue to take your antibiotics until they run out.  If you have worsening pain, fever, or can't keep down food or water, you should be seen.

## 2019-01-16 ENCOUNTER — Encounter: Payer: Self-pay | Admitting: Family Medicine

## 2019-01-16 ENCOUNTER — Ambulatory Visit (INDEPENDENT_AMBULATORY_CARE_PROVIDER_SITE_OTHER): Payer: Medicaid Other | Admitting: Allergy

## 2019-01-16 ENCOUNTER — Ambulatory Visit (INDEPENDENT_AMBULATORY_CARE_PROVIDER_SITE_OTHER): Payer: Medicaid Other | Admitting: Family Medicine

## 2019-01-16 ENCOUNTER — Other Ambulatory Visit: Payer: Self-pay

## 2019-01-16 ENCOUNTER — Encounter: Payer: Self-pay | Admitting: Allergy

## 2019-01-16 VITALS — BP 110/64 | HR 110

## 2019-01-16 VITALS — BP 110/76 | HR 98 | Temp 98.5°F | Resp 16 | Ht 62.0 in

## 2019-01-16 DIAGNOSIS — Z91030 Bee allergy status: Secondary | ICD-10-CM

## 2019-01-16 DIAGNOSIS — N2889 Other specified disorders of kidney and ureter: Secondary | ICD-10-CM | POA: Diagnosis not present

## 2019-01-16 DIAGNOSIS — L309 Dermatitis, unspecified: Secondary | ICD-10-CM | POA: Diagnosis not present

## 2019-01-16 DIAGNOSIS — N12 Tubulo-interstitial nephritis, not specified as acute or chronic: Secondary | ICD-10-CM

## 2019-01-16 DIAGNOSIS — J4520 Mild intermittent asthma, uncomplicated: Secondary | ICD-10-CM

## 2019-01-16 DIAGNOSIS — J310 Chronic rhinitis: Secondary | ICD-10-CM | POA: Diagnosis not present

## 2019-01-16 DIAGNOSIS — L508 Other urticaria: Secondary | ICD-10-CM

## 2019-01-16 HISTORY — DX: Tubulo-interstitial nephritis, not specified as acute or chronic: N12

## 2019-01-16 HISTORY — DX: Dermatitis, unspecified: L30.9

## 2019-01-16 MED ORDER — TRIAMCINOLONE ACETONIDE 0.1 % EX OINT: 1.0000 "application " | TOPICAL_OINTMENT | Freq: Two times a day (BID) | CUTANEOUS | 1 refills | Status: DC

## 2019-01-16 MED ORDER — IBUPROFEN 600 MG PO TABS: 600.0000 mg | ORAL_TABLET | Freq: Three times a day (TID) | ORAL | 0 refills | Status: DC | PRN

## 2019-01-16 MED ORDER — DIPHENHYDRAMINE-ZINC ACETATE 2-0.1 % EX CREA: 1.0000 "application " | TOPICAL_CREAM | Freq: Three times a day (TID) | CUTANEOUS | 0 refills | Status: DC | PRN

## 2019-01-16 NOTE — Patient Instructions (Addendum)
It was a pleasure to see you today! Thank you for choosing [facility] for your primary care. Michelle [name] was seen for hospital follow-up for pyonephritis.   1.  Continue your Keflex and Flomax prescription. 2.  We will prescribe you ibuprofen for pain 3.  Given the findings of medullary kidney on ultrasound while in the hospital, we are referring you to urology.  Please come back to the office or go to the emergency room if you are unable to tolerate your medications or have worsening abdominal pain or fevers or chills or any worsening urinary tract symptoms.  Best,  Marny Lowenstein, MD, MS FAMILY MEDICINE RESIDENT - PGY2  01/16/2019 9:54 AM

## 2019-01-16 NOTE — Assessment & Plan Note (Signed)
Past history - Diagnosed with asthma over 10 years ago and currently has albuterol which helps.  May use albuterol rescue inhaler 2 puffs or nebulizer every 4 to 6 hours as needed for shortness of breath, chest tightness, coughing, and wheezing. May use albuterol rescue inhaler 2 puffs 5 to 15 minutes prior to strenuous physical activities. Monitor frequency of use.

## 2019-01-16 NOTE — Assessment & Plan Note (Signed)
Pyelonephritis.  Recent hospitalization.  Still has ongoing left lower quadrant and left flank pain.  Although improved.  Urinary symptoms are better.  Continue Keflex as per discharge instructions.  Continue Flomax as well.  Findings of medullary kidney on ultrasound and hospitalization.  Is recommended that patient have ambulatory follow-up with urology.  Referral will be placed.  We will also prescribe ibuprofen for ongoing pain management now.

## 2019-01-16 NOTE — Assessment & Plan Note (Signed)
Past history - Stung by an insect 6 months ago and had hives for 1 week. Needed steroid injection. Bloodwork slightly positive to honeybee.  Avoid bee stings and carry Epipen especially when outdoors in the warmer weather.   For mild symptoms you can take over the counter antihistamines such as Benadryl and monitor symptoms closely. If symptoms worsen or if you have severe symptoms including breathing issues, throat closure, significant swelling, whole body hives, severe diarrhea and vomiting, lightheadedness then inject epinephrine and seek immediate medical care afterwards.

## 2019-01-16 NOTE — Assessment & Plan Note (Signed)
Past history - Reaction to premade oatmeal cookies 1 year ago in the form of rash/hives. Patient used to tolerate these cookies with no issues. Then in March/April noticed difficulty breathing after eating eggs and on Easter had a hive outbreak after eating an egg. 2020 testing negative to foods. 2020 immunocap negative to foods as well. Slightly elevated tryptase, normal c kit.  Interim history - Only breaks out if misses zyrtec.  . Avoid the following potential triggers: alcohol, tight clothing, NSAIDs.  . Take tylenol for pain. . Continue zyrtec 10mg once a day and may increase to twice a day if needed. . If you breakout in rash/hives, take pictures.  . You may try the oatmeal at home. . For mild symptoms you can take over the counter antihistamines such as Benadryl and monitor symptoms closely. If symptoms worsen or if you have severe symptoms including breathing issues, throat closure, significant swelling, whole body hives, severe diarrhea and vomiting, lightheadedness then inject epinephrine and seek immediate medical care afterwards. 

## 2019-01-16 NOTE — Progress Notes (Signed)
    Subjective:  Michelle [name] is a 28 y.o. female who presents to the [facility] today with a chief complaint of hospital follow-up for pyonephritis.   HPI: Patient hospitalized on 01/13/2019 for pyelonephritis and found to have multiple renal calculi.  Likely is recently passed nephrolithiasis.  Patient discouraged from hospital on Keflex 14 days total antibiotic course.  End date 7/11.  Patient says that she feels better.  She says it she is urinating better on the Flomax.  Denies any urine blood.  Denies fevers or chills.  Still has ongoing lower abdominal pain and right-sided flank pain.  Patient has completed her oxycodone that she was discharged with and still having ongoing pain.  ROS: Per HPI   Objective:  Physical Exam: BP 110/64   Pulse (!) 110   LMP 08/14/2016 (Exact Date)   SpO2 99%   Gen: NAD, resting comfortably GI: No rebound, mild guarding, CVA tenderness on left, LLQ abdominal tenderness,  MSK: no edema, cyanosis, or clubbing noted Skin: warm, dry Neuro: grossly normal, moves all extremities Psych: Normal affect and thought content  Results for orders placed or performed during the hospital encounter of 01/12/19 (from the past 72 hour(s))  Lactic acid, plasma     Status: None   Collection Time: 01/13/19 12:08 PM  Result Value Ref Range   Lactic Acid, Venous 1.1 0.5 - 1.9 mmol/L    Comment: Performed at [facility], [address] [place], [place] [address]  CBC     Status: Abnormal   Collection Time: 01/14/19  3:06 AM  Result Value Ref Range   WBC 10.2 4.0 - 10.5 K/uL   RBC 3.36 (L) 3.87 - 5.11 MIL/uL   Hemoglobin 10.4 (L) 12.0 - 15.0 g/dL   HCT 31.7 (L) 36.0 - 46.0 %   MCV 94.3 80.0 - 100.0 fL   MCH 31.0 26.0 - 34.0 pg   MCHC 32.8 30.0 - 36.0 g/dL   RDW 11.4 (L) 11.5 - 15.5 %   Platelets 156 150 - 400 K/uL   nRBC 0.0 0.0 - 0.2 %    Comment: Performed at [facility], [place] [address] [place], [place] [address]  Basic metabolic panel     Status:  Abnormal   Collection Time: 01/14/19  3:06 AM  Result Value Ref Range   Sodium 137 135 - 145 mmol/L   Potassium 3.8 3.5 - 5.1 mmol/L   Chloride 109 98 - 111 mmol/L   CO2 21 (L) 22 - 32 mmol/L   Glucose, Bld 101 (H) 70 - 99 mg/dL   BUN <5 (L) 6 - 20 mg/dL   Creatinine, Ser 0.63 0.44 - 1.00 mg/dL   Calcium 7.8 (L) 8.9 - 10.3 mg/dL   GFR calc non Af Amer >60 >60 mL/min   GFR calc Af Amer >60 >60 mL/min   Anion gap 7 5 - 15    Comment: Performed at [facility], [place] [address], [place], [place] [address]     Assessment/Plan:  No problem-specific Assessment & Plan notes found for this encounter.   Lab Orders  No laboratory test(s) ordered today    No orders of the defined types were placed in this encounter.     Marny Lowenstein, MD, MS FAMILY MEDICINE RESIDENT - PGY2 01/16/2019 9:49 AM

## 2019-01-16 NOTE — Patient Instructions (Addendum)
Rash  Apply triamcinolone ointment twice a day as needed to the red areas.  May use topical benadryl cream as needed for itching.   Chronic urticaria  Avoid the following potential triggers: alcohol, tight clothing, NSAIDs.   Take tylenol for pain.  Continue zyrtec 10mg  once a day and may increase to twice a day if needed.  If you breakout in rash/hives, take pictures.   You may try the oatmeal at home.  For mild symptoms you can take over the counter antihistamines such as Benadryl and monitor symptoms closely. If symptoms worsen or if you have severe symptoms including breathing issues, throat closure, significant swelling, whole body hives, severe diarrhea and vomiting, lightheadedness then inject epinephrine and seek immediate medical care afterwards.  Mild intermittent asthma without complication  May use albuterol rescue inhaler 2 puffs or nebulizer every 4 to 6 hours as needed for shortness of breath, chest tightness, coughing, and wheezing. May use albuterol rescue inhaler 2 puffs 5 to 15 minutes prior to strenuous physical activities. Monitor frequency of use.   Bee sting allergy  Avoid bee stings and carry Epipen especially when outdoors in the warmer weather.   For mild symptoms you can take over the counter antihistamines such as Benadryl and monitor symptoms closely. If symptoms worsen or if you have severe symptoms including breathing issues, throat closure, significant swelling, whole body hives, severe diarrhea and vomiting, lightheadedness then inject epinephrine and seek immediate medical care afterwards.  Chronic rhinitis  Continue Flonase 1 spray 1-2 times a day as needed.    Follow up in 3 months

## 2019-01-16 NOTE — Progress Notes (Signed)
Follow Up Note  RE: Michelle [name] MRN: [id number] DOB: 10-08-1990 Date of Office Visit: 01/16/2019  Referring provider: Rory Percy, DO Primary care provider: Rory Percy, DO  Chief Complaint: Rash (left arm)  History of Present Illness: I had the pleasure of seeing Michelle [name] for a follow up visit at the [facility] on 01/16/2019. She is a 28 y.o. female, who is being followed for CIU, asthma, bee sting allergy, nonallergic rhinitis. Today she is here for new complaint of reaction to tape. Her previous allergy office visit was on 01/06/2019 with Dr. Maudie Mercury.   Rash: Breaking out in rash where the tape was placed around her IVs and EKG while she was hospitalized. Describes it as extremely itchy.   Chronic urticaria Taking zyrtec 88m daily with good benefit.  Assessment and Plan: Michelle [name] a 28y.o. female with: Dermatitis Contact dermatitis from tape.  Apply triamcinolone ointment twice a day as needed to the red areas.  May use topical benadryl cream as needed for itching.  Chronic urticaria Past history - Reaction to premade oatmeal cookies 1 year ago in the form of rash/hives. Patient used to tolerate these cookies with no issues. Then in March/April noticed difficulty breathing after eating eggs and on Easter had a hive outbreak after eating an egg. 2020 testing negative to foods. 2020 immunocap negative to foods as well. Slightly elevated tryptase, normal c kit.  Interim history - Only breaks out if misses zyrtec.  .Marland KitchenAvoid the following potential triggers: alcohol, tight clothing, NSAIDs.  . Take tylenol for pain. . Continue zyrtec 164monce a day and may increase to twice a day if needed. . If you breakout in rash/hives, take pictures.  . You may try the oatmeal at home. . For mild symptoms you can take over the counter antihistamines such as Benadryl and monitor symptoms closely. If symptoms worsen or if you have severe symptoms including  breathing issues, throat closure, significant swelling, whole body hives, severe diarrhea and vomiting, lightheadedness then inject epinephrine and seek immediate medical care afterwards.  Bee sting allergy Past history - Stung by an insect 6 months ago and had hives for 1 week. Needed steroid injection. Bloodwork slightly positive to honeybee.  Avoid bee stings and carry Epipen especially when outdoors in the warmer weather.   For mild symptoms you can take over the counter antihistamines such as Benadryl and monitor symptoms closely. If symptoms worsen or if you have severe symptoms including breathing issues, throat closure, significant swelling, whole body hives, severe diarrhea and vomiting, lightheadedness then inject epinephrine and seek immediate medical care afterwards.  Chronic rhinitis Past history - Rhino conjunctivitis symptoms for the past 10+ years during the spring only. Using Claritin prn with good benefit. 2020 skin testing and immunocap was negative to environmental allergies.  Continue Flonase 1 spray 1-2 times a day as needed.   Mild intermittent asthma without complication Past history - Diagnosed with asthma over 10 years ago and currently has albuterol which helps.  May use albuterol rescue inhaler 2 puffs or nebulizer every 4 to 6 hours as needed for shortness of breath, chest tightness, coughing, and wheezing. May use albuterol rescue inhaler 2 puffs 5 to 15 minutes prior to strenuous physical activities. Monitor frequency of use.   Return in about 3 months (around 04/18/2019).  Meds ordered this encounter  Medications  . triamcinolone ointment (KENALOG) 0.1 %    Sig: Apply 1 application topically 2 (two) times  daily. Use below the neck twice a day as needed.    Dispense:  30 g    Refill:  1  . diphenhydrAMINE-zinc acetate (BENADRYL EXTRA STRENGTH) cream    Sig: Apply 1 application topically 3 (three) times daily as needed for itching.    Dispense:  28.4 g     Refill:  0   Diagnostics: None.   Medication List:  Current Outpatient Medications  Medication Sig Dispense Refill  . albuterol (PROAIR HFA) 108 (90 Base) MCG/ACT inhaler Inhale 2 puffs into the lungs every 6 (six) hours as needed for wheezing or shortness of breath.    . cephALEXin (KEFLEX) 500 MG capsule Take 1 capsule (500 mg total) by mouth every 6 (six) hours for 12 days. 48 capsule 0  . cetirizine (ZYRTEC) 10 MG tablet Take 1 tablet (10 mg total) by mouth daily. 30 tablet 5  . EPINEPHrine (EPIPEN 2-PAK) 0.3 mg/0.3 mL IJ SOAJ injection Inject 0.3 mLs (0.3 mg total) into the muscle once as needed for up to 1 dose for anaphylaxis. 2 Device 2  . fluticasone (FLONASE) 50 MCG/ACT nasal spray Place 1 spray into both nostrils daily. 15.8 mL 5  . ibuprofen (ADVIL) 600 MG tablet Take 1 tablet (600 mg total) by mouth every 8 (eight) hours as needed. 30 tablet 0  . ipratropium-albuterol (DUONEB) 0.5-2.5 (3) MG/3ML SOLN Take 3 mLs by nebulization every 4 (four) hours as needed. 360 mL 0  . oxyCODONE (OXY IR/ROXICODONE) 5 MG immediate release tablet Take 1 tablet (5 mg total) by mouth every 6 (six) hours as needed for severe pain or breakthrough pain. 4 tablet 0  . polyethylene glycol (MIRALAX / GLYCOLAX) 17 g packet Take 17 g by mouth daily. 14 each 0  . RETIN-A 0.025 % cream Apply 1 application topically See admin instructions. Apply a pea-sized amount to the face nightly    . senna (SENOKOT) 8.6 MG TABS tablet Take 1 tablet (8.6 mg total) by mouth daily as needed for mild constipation. 30 tablet 0  . tamsulosin (FLOMAX) 0.4 MG CAPS capsule Take 1 capsule (0.4 mg total) by mouth daily after supper. 14 capsule 0  . triamcinolone cream (KENALOG) 0.1 % Apply 1 application topically 2 (two) times daily. 30 g 0  . diphenhydrAMINE-zinc acetate (BENADRYL EXTRA STRENGTH) cream Apply 1 application topically 3 (three) times daily as needed for itching. 28.4 g 0  . triamcinolone ointment (KENALOG) 0.1 % Apply 1  application topically 2 (two) times daily. Use below the neck twice a day as needed. 30 g 1   No current facility-administered medications for this visit.    Allergies: Allergies  Allergen Reactions  . Adhesive [Tape] Hives and Other (See Comments)    EKG or heart monitor pads- caused raised, red areas (WELTS) on the skin; "sensitive" pads fell off  . Bee Venom Hives and Other (See Comments)    Welts, also  . Oatmeal Hives    Hives from the waist down and had to seek medical treatment   I reviewed her past medical history, social history, family history, and environmental history and no significant changes have been reported from previous visit on 01/06/2019.  Review of Systems  Constitutional: Negative for appetite change, chills, fever and unexpected weight change.  HENT: Negative for congestion, rhinorrhea and sneezing.   Eyes: Negative for itching.  Respiratory: Negative for cough, chest tightness, shortness of breath and wheezing.   Cardiovascular: Negative for chest pain.  Gastrointestinal: Negative for abdominal pain.  Genitourinary: Positive for difficulty urinating.  Skin: Positive for rash.  Allergic/Immunologic: Negative for environmental allergies.  Neurological: Negative for headaches.   Objective: BP 110/76   Pulse 98   Temp 98.5 F (36.9 C)   Resp 16   Ht _0  (1.575 m)   LMP 08/14/2016 (Exact Date)   SpO2 100%   BMI 26.52 kg/m  Body mass index is 26.52 kg/m. Physical Exam  Constitutional: She is oriented to person, place, and time. She appears well-developed and well-nourished.  HENT:  Head: Normocephalic and atraumatic.  Right Ear: External ear normal.  Left Ear: External ear normal.  Nose: Nose normal.  Mouth/Throat: Oropharynx is clear and moist.  Eyes: Conjunctivae and EOM are normal.  Neck: Neck supple.  Cardiovascular: Normal rate, regular rhythm and normal heart sounds. Exam reveals no gallop and no friction rub.  No murmur heard.  Pulmonary/Chest: Effort normal and breath sounds normal. She has no wheezes. She has no rales.  Abdominal: Soft.  Neurological: She is alert and oriented to person, place, and time.  Skin: Skin is warm. Rash noted.  Left antecubital fossa erythema  Psychiatric: She has a normal mood and affect. Her behavior is normal.  Nursing note and vitals reviewed.  Previous notes and tests were reviewed. The plan was reviewed with the patient/family, and all questions/concerned were addressed.  It was my pleasure to see Michelle [name] today and participate in her care. Please feel free to contact me with any questions or concerns.  Sincerely,  Rexene Alberts, DO Allergy & Immunology  [facility] office: [phone] [facility] office: [phone]

## 2019-01-16 NOTE — Assessment & Plan Note (Signed)
Past history - Rhino conjunctivitis symptoms for the past 10+ years during the spring only. Using Claritin prn with good benefit. 2020 skin testing and immunocap was negative to environmental allergies.  Continue Flonase 1 spray 1-2 times a day as needed.

## 2019-01-16 NOTE — Assessment & Plan Note (Signed)
Contact dermatitis from tape.  Apply triamcinolone ointment twice a day as needed to the red areas.  May use topical benadryl cream as needed for itching.

## 2019-01-17 LAB — CULTURE, BLOOD (ROUTINE X 2)
Culture: NO GROWTH
Culture: NO GROWTH
Special Requests: ADEQUATE

## 2019-01-20 ENCOUNTER — Inpatient Hospital Stay: Payer: Medicaid Other | Admitting: Family Medicine

## 2019-01-27 ENCOUNTER — Ambulatory Visit (INDEPENDENT_AMBULATORY_CARE_PROVIDER_SITE_OTHER): Payer: Medicaid Other | Admitting: Family Medicine

## 2019-01-27 ENCOUNTER — Other Ambulatory Visit: Payer: Self-pay

## 2019-01-27 ENCOUNTER — Encounter: Payer: Self-pay | Admitting: Family Medicine

## 2019-01-27 VITALS — BP 100/60 | HR 97 | Ht 62.0 in | Wt 145.2 lb

## 2019-01-27 DIAGNOSIS — N12 Tubulo-interstitial nephritis, not specified as acute or chronic: Secondary | ICD-10-CM

## 2019-01-27 DIAGNOSIS — N898 Other specified noninflammatory disorders of vagina: Secondary | ICD-10-CM | POA: Diagnosis not present

## 2019-01-27 DIAGNOSIS — R300 Dysuria: Secondary | ICD-10-CM

## 2019-01-27 LAB — POCT HEMOGLOBIN: Hemoglobin: 13.7 g/dL (ref 11–14.6)

## 2019-01-27 LAB — POCT URINALYSIS DIP (MANUAL ENTRY)
Bilirubin, UA: NEGATIVE
Blood, UA: NEGATIVE
Glucose, UA: NEGATIVE mg/dL
Ketones, POC UA: NEGATIVE mg/dL
Leukocytes, UA: NEGATIVE
Nitrite, UA: NEGATIVE
Protein Ur, POC: >=300 mg/dL — AB
Spec Grav, UA: 1.02 (ref 1.010–1.025)
Urobilinogen, UA: 0.2 E.U./dL
pH, UA: 7 (ref 5.0–8.0)

## 2019-01-27 MED ORDER — FLUCONAZOLE 150 MG PO TABS: 150.0000 mg | ORAL_TABLET | Freq: Once | ORAL | 0 refills | Status: AC

## 2019-01-27 NOTE — Patient Instructions (Signed)
Thank you for coming to see me today. It was a pleasure! Today we talked about:   The referral has been placed at [facility]: [phone], please call their office for follow up.   We will call you if you have any trouble with low blood count.  Message me or Dr. Ky Barban if you have any further concerns.   Please follow-up as needed.  If you have any questions or concerns, please do not hesitate to call the office at [phone].  Take Care,   [place] Corwyn Vora, DO

## 2019-01-27 NOTE — Progress Notes (Signed)
  Subjective:  Patient ID: Michelle [name]  DOB: November 10, 1990 MRN: [id number]  Michelle [name] is a 28 y.o. female with a PMH of migraines, asthma, anxiety, street recent hospitalization with sepsis due to pyelonephritis and stones, here today for continued dysuria follow-up.   HPI:  Hospital f/u for urosepsis: - Patient took her last abx today, after 2 weeks of treatment for pyelo. She was hospitalized for sepsis 2/2 to pyelo. She is now feeling much better. Denies any dysuria or hematuria, denies any fevers, chills.  - She has appointment with urology on 7/15 for f/u for her stones that were present - had resolution of hydroneph while in hospital but doesn't believe she has passed a stone - she is still having intermittent pain in her RLQ, but no constipation, diarrhea, blood in stool. Believes it may still be a stone as it is similar pain she had in hospital  Vaginal Discharge - has been ongoing for 4 days  - Denies nausea or vomiting - Discharge described as thick, white - Denies burning with urination, no hematuria.  - Patient reports has had BV in the past and yeast, and this feels like prev yeast infection  ROS: as mentioned in HPI  Smoking status reviewed  Patient Active Problem List   Diagnosis Date Noted  . Vaginal itching 01/31/2019  . Pyelonephritis 01/16/2019  . Dermatitis 01/16/2019  . Nephrolithiasis   . Uncomplicated asthma   . Eustachian tube dysfunction, bilateral 12/16/2018  . Bee sting allergy 11/06/2018  . Allergic reaction 11/04/2018  . Mild intermittent asthma without complication 53/20/2334  . Chronic rhinitis 11/04/2018  . Chronic urticaria 10/04/2018  . Easy bruising 11/16/2017  . Anxiety 10/27/2016  . Migraine 02/10/2016  . PTSD 03/02/2010  . 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 WWP. Skin: warm and dry, no rashes noted Neuro: alert and oriented, no focal deficits Psych: normal affect, normal thought content  Assessment & Plan:   Pyelonephritis Hgb rechecked today after recent hospitalization where she was anemic. Results released on mychart which show improvement. Patient asymptomatic and has finished abx course. She is feeling much better and sx have improved.   Vaginal itching Patient has completed abx course today, requesting diflucan for vaginal itching after long course of abx. Has sx c/w previous yeast infections. Will treat and patient to return if sx persist.    [place] Jilleen Essner, DO Family Medicine Resident PGY-3

## 2019-01-30 DIAGNOSIS — N200 Calculus of kidney: Secondary | ICD-10-CM | POA: Diagnosis not present

## 2019-01-31 DIAGNOSIS — N898 Other specified noninflammatory disorders of vagina: Secondary | ICD-10-CM | POA: Insufficient documentation

## 2019-01-31 NOTE — Assessment & Plan Note (Signed)
Hgb rechecked today after recent hospitalization where she was anemic. Results released on mychart which show improvement. Patient asymptomatic and has finished abx course. She is feeling much better and sx have improved.

## 2019-01-31 NOTE — Assessment & Plan Note (Signed)
Patient has completed abx course today, requesting diflucan for vaginal itching after long course of abx. Has sx c/w previous yeast infections. Will treat and patient to return if sx persist.

## 2019-02-06 ENCOUNTER — Encounter: Payer: Self-pay | Admitting: Family Medicine

## 2019-02-28 ENCOUNTER — Encounter (HOSPITAL_COMMUNITY): Payer: Self-pay

## 2019-02-28 ENCOUNTER — Ambulatory Visit (HOSPITAL_COMMUNITY)
Admission: EM | Admit: 2019-02-28 | Discharge: 2019-02-28 | Disposition: A | Discharge status: Home / Self Care | Payer: Medicaid Other | Attending: Family Medicine | Admitting: Family Medicine

## 2019-02-28 ENCOUNTER — Other Ambulatory Visit: Payer: Self-pay

## 2019-02-28 DIAGNOSIS — R1031 Right lower quadrant pain: Secondary | ICD-10-CM | POA: Diagnosis not present

## 2019-02-28 LAB — POCT URINALYSIS DIP (DEVICE)
Bilirubin Urine: NEGATIVE
Glucose, UA: NEGATIVE mg/dL
Hgb urine dipstick: NEGATIVE
Ketones, ur: 15 mg/dL — AB
Leukocytes,Ua: NEGATIVE
Nitrite: NEGATIVE
Protein, ur: NEGATIVE mg/dL
Specific Gravity, Urine: 1.01 (ref 1.005–1.030)
Urobilinogen, UA: 0.2 mg/dL (ref 0.0–1.0)
pH: 7 (ref 5.0–8.0)

## 2019-02-28 MED ORDER — TRAMADOL HCL 50 MG PO TABS: 50.0000 mg | ORAL_TABLET | Freq: Four times a day (QID) | ORAL | 0 refills | Status: DC | PRN

## 2019-02-28 MED ORDER — KETOROLAC TROMETHAMINE 60 MG/2ML IM SOLN
60.0000 mg | Freq: Once | INTRAMUSCULAR | Status: AC
  Administered 2019-02-28: 60 mg via INTRAMUSCULAR

## 2019-02-28 MED ORDER — KETOROLAC TROMETHAMINE 60 MG/2ML IM SOLN
INTRAMUSCULAR | Status: AC
  Filled 2019-02-28: qty 2

## 2019-02-28 NOTE — ED Triage Notes (Signed)
Pt presents with complaints of right lower quadrant pain that is similar pain to when she has a kidney stone. Patient states pain started early this am. Denies any other symptoms.

## 2019-02-28 NOTE — Discharge Instructions (Addendum)
Your urine was not convincing of a urinary tract infection or kidney stone I will give you something for pain make sure you are pushing fluids If your symptoms worsen you need to go the ER.

## 2019-02-28 NOTE — ED Provider Notes (Signed)
[facility]    CSN: [id number] Arrival date & time: 02/28/19  1134     History   Chief Complaint Chief Complaint  Patient presents with  . Abdominal Pain    HPI Michelle [name] is a 28 y.o. female.   Patient is a 28 year old female with past medical history of anxiety, asthma, bipolar, depression, hypertension, PTSD, migraine, seizures, tachycardia.  Recent hospitalization for sepsis due to pyelonephritis nephrolithiasis.  She presents today with right lower quadrant pain similar to before that started approximately 730 this morning.  The pain is been constant.  Pain is described as sharp.  Denies any associated dysuria, hematuria or urinary frequency.  She has had some associated nausea.  Reporting regular bowel movements. Patient's last menstrual period was 08/14/2016 (exact date).  History of hysterectomy and appendectomy.  ROS per HPI      Past Medical History:  Diagnosis Date  . Anxiety   . Asthma   . Bipolar disorder ([place])    no current med.  . Depression    no current med.  . Family history of adverse reaction to anesthesia    states mother and sister are hard to wake up post-op  . Gestational diabetes   . History of seizure 06/20/2012   x 1 - during delivery of child(eclampsia)  . Hypertension   . Lipoma of lower back 08/2015  . Migraine   . PTSD (post-traumatic stress disorder)   . PTSD (post-traumatic stress disorder)   . Seizures ([place])   . Tachycardia   . TMJ (dislocation of temporomandibular joint)   . Vertigo     Patient Active Problem List   Diagnosis Date Noted  . Vaginal itching 01/31/2019  . Pyelonephritis 01/16/2019  . Dermatitis 01/16/2019  . Nephrolithiasis   . Uncomplicated asthma   . Eustachian tube dysfunction, bilateral 12/16/2018  . Bee sting allergy 11/06/2018  . Allergic reaction 11/04/2018  . Mild intermittent asthma without complication 56/97/9480  . Chronic rhinitis 11/04/2018  . Chronic urticaria 10/04/2018  . Easy  bruising 11/16/2017  . Anxiety 10/27/2016  . SUI (stress urinary incontinence, female) 04/10/2016  . Migraine 02/10/2016  . PTSD 03/02/2010  . Tobacco use disorder 02/07/2008  . 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 ABDOMINAL HYSTERECTOMY    . ADENOIDECTOMY, TONSILLECTOMY AND MYRINGOTOMY WITH TUBE PLACEMENT  1990's  . APPENDECTOMY    . CESAREAN SECTION  06/20/2012   Procedure: CESAREAN SECTION;  Surgeon: Emily Filbert, MD;  Location: [facility];  Service: Obstetrics;  Laterality: N/A;  . LAPAROSCOPIC APPENDECTOMY  07/25/2012   Procedure: APPENDECTOMY LAPAROSCOPIC;  Surgeon: Zenovia Jarred, MD;  Location: [facility];  Service: General;  Laterality: N/A;  . LAPAROSCOPIC TUBAL LIGATION Bilateral 08/04/2014   Procedure: BILATERAL LAPAROSCOPIC TUBAL LIGATION;  Surgeon: Woodroe Mode, MD;  Location: [facility];  Service: Gynecology;  Laterality: Bilateral;  . LIPOMA EXCISION N/A 09/16/2015   Procedure: EXCISION LIPOMA LUMBAR REGION;  Surgeon: Donnie Mesa, MD;  Location: [facility];  Service: General;  Laterality: N/A;  . TONSILLECTOMY    . WISDOM TOOTH EXTRACTION      OB History    Gravida  3   Para  3   Term  2   Preterm  1   AB  0   Living  3     SAB  0   TAB  0   Ectopic  0  Multiple  0   Live Births  1        Obstetric Comments  C-Section Indication: Non-reassuring fetal tracing, growth delay & marked proteinuria & Pre-Eclampsia Eclamptic Seizure during C-section delivery         Home Medications    Prior to Admission medications   Medication Sig Start Date End Date Taking? Authorizing Provider  albuterol (PROAIR HFA) 108 (90 Base) MCG/ACT inhaler Inhale 2 puffs into the lungs every 6 (six) hours as needed for wheezing or shortness of breath.   Yes [provider]  cetirizine (ZYRTEC) 10 MG tablet Take 1 tablet (10 mg total) by mouth daily. 01/06/19  Yes Garnet Sierras, DO   fluticasone (FLONASE) 50 MCG/ACT nasal spray Place 1 spray into both nostrils daily. 01/06/19  Yes Garnet Sierras, DO  ibuprofen (ADVIL) 600 MG tablet Take 1 tablet (600 mg total) by mouth every 8 (eight) hours as needed. 01/16/19  Yes Bonnita Hollow, MD  diphenhydrAMINE-zinc acetate (BENADRYL EXTRA STRENGTH) cream Apply 1 application topically 3 (three) times daily as needed for itching. 01/16/19   Garnet Sierras, DO  EPINEPHrine (EPIPEN 2-PAK) 0.3 mg/0.3 mL IJ SOAJ injection Inject 0.3 mLs (0.3 mg total) into the muscle once as needed for up to 1 dose for anaphylaxis. 11/04/18   Garnet Sierras, DO  ipratropium-albuterol (DUONEB) 0.5-2.5 (3) MG/3ML SOLN Take 3 mLs by nebulization every 4 (four) hours as needed. 10/13/18   Raylene Everts, MD  oxyCODONE (OXY IR/ROXICODONE) 5 MG immediate release tablet Take 1 tablet (5 mg total) by mouth every 6 (six) hours as needed for severe pain or breakthrough pain. 01/14/19   Meccariello, Bernita Raisin, DO  polyethylene glycol (MIRALAX / GLYCOLAX) 17 g packet Take 17 g by mouth daily. 01/15/19   Meccariello, Bernita Raisin, DO  RETIN-A 0.025 % cream Apply 1 application topically See admin instructions. Apply a pea-sized amount to the face nightly 09/27/18   [provider]  senna (SENOKOT) 8.6 MG TABS tablet Take 1 tablet (8.6 mg total) by mouth daily as needed for mild constipation. 01/14/19   Meccariello, Bernita Raisin, DO  tamsulosin (FLOMAX) 0.4 MG CAPS capsule Take 1 capsule (0.4 mg total) by mouth daily after supper. 01/07/19   Raylene Everts, MD  traMADol (ULTRAM) 50 MG tablet Take 1 tablet (50 mg total) by mouth every 6 (six) hours as needed. 02/28/19   Loura Halt A, NP  triamcinolone cream (KENALOG) 0.1 % Apply 1 application topically 2 (two) times daily. 10/19/18   Wieters, Hallie C, PA-C  triamcinolone ointment (KENALOG) 0.1 % Apply 1 application topically 2 (two) times daily. Use below the neck twice a day as needed. 01/16/19   Garnet Sierras, DO    Family History Family  History  Problem Relation Age of Onset  . Breast cancer Paternal Grandmother   . Colon cancer Paternal Grandfather   . Colon cancer Maternal Grandfather   . Colon polyps Maternal Aunt   . Diabetes Mother   . Anesthesia problems Mother        hard to wake up post-op  . Diabetes Sister   . Anesthesia problems Sister        hard to wake up post-op  . Diabetes Father   . Heart disease Father   . Eczema Neg Hx   . Allergic rhinitis Neg Hx   . Asthma Neg Hx     Social History Social History   Tobacco Use  . Smoking  status: Former Smoker    Packs/day: 0.00    Years: 14.00    Pack years: 0.00    Types: Cigarettes    Quit date: 03/17/2017    Years since quitting: 1.9  . Smokeless tobacco: Never Used  Substance Use Topics  . Alcohol use: No  . Drug use: No     Allergies   Adhesive [tape], Bee venom, and Oatmeal   Review of Systems Review of Systems   Physical Exam Triage Vital Signs ED Triage Vitals  Enc Vitals Group     BP      Pulse      Resp      Temp      Temp src      SpO2      Weight      Height      Head Circumference      Peak Flow      Pain Score      Pain Loc      Pain Edu?      Excl. in [place]?    No data found.  Updated Vital Signs BP 114/70   Pulse (!) 104   Temp 98.4 F (36.9 C)   Resp 18   LMP 08/14/2016 (Exact Date)   SpO2 98%   Visual Acuity Right Eye Distance:   Left Eye Distance:   Bilateral Distance:    Right Eye Near:   Left Eye Near:    Bilateral Near:     Physical Exam Vitals signs and nursing note reviewed.  Constitutional:      General: She is not in acute distress.    Appearance: She is well-developed. She is not ill-appearing, toxic-appearing or diaphoretic.  Pulmonary:     Effort: Pulmonary effort is normal.  Abdominal:     Palpations: Abdomen is soft.     Tenderness: There is abdominal tenderness in the right lower quadrant. There is no right CVA tenderness, left CVA tenderness, guarding or rebound.  Skin:     General: Skin is warm and dry.  Neurological:     Mental Status: She is alert.  Psychiatric:        Mood and Affect: Mood normal.      UC Treatments / Results  Labs (all labs ordered are listed, but only abnormal results are displayed) Labs Reviewed  POCT URINALYSIS DIP (DEVICE) - Abnormal; Notable for the following components:      Result Value   Ketones, ur 15 (*)    All other components within normal limits    EKG   Radiology No results found.  Procedures Procedures (including critical care time)  Medications Ordered in UC Medications  ketorolac (TORADOL) injection 60 mg (60 mg Intramuscular Given 02/28/19 1323)  ketorolac (TORADOL) 60 MG/2ML injection (has no administration in time range)    Initial Impression / Assessment and Plan / UC Course  I have reviewed the triage vital signs and the nursing notes.  Pertinent labs & imaging results that were available during my care of the patient were reviewed by me and considered in my medical decision making (see chart for details).     Urine not convincing of nephrolithiasis or UTI. Pain could be GI related to include constipation or gas. Could be MS pain Toradol injection given here for pain.  Tramadol sent to the pharmacy for more severe pain Recommended drink plenty of fluids and if symptoms continue or worsen she will need to go to the ER. Patient understand and agree.  Final Clinical Impressions(s) / UC Diagnoses   Final diagnoses:  Right lower quadrant abdominal pain     Discharge Instructions     Your urine was not convincing of a urinary tract infection or kidney stone I will give you something for pain make sure you are pushing fluids If your symptoms worsen you need to go the ER.    ED Prescriptions    Medication Sig Dispense Auth. Provider   traMADol (ULTRAM) 50 MG tablet Take 1 tablet (50 mg total) by mouth every 6 (six) hours as needed. 12 tablet Loura Halt A, NP     Controlled Substance  Prescriptions [place] Controlled Substance Registry consulted? Not Applicable   Orvan July, NP 02/28/19 1400

## 2019-03-17 DIAGNOSIS — L720 Epidermal cyst: Secondary | ICD-10-CM | POA: Diagnosis not present

## 2019-04-07 ENCOUNTER — Encounter (HOSPITAL_COMMUNITY): Payer: Self-pay | Admitting: Emergency Medicine

## 2019-04-07 ENCOUNTER — Other Ambulatory Visit: Payer: Self-pay

## 2019-04-07 ENCOUNTER — Emergency Department (HOSPITAL_COMMUNITY): Payer: Medicaid Other

## 2019-04-07 ENCOUNTER — Emergency Department (HOSPITAL_COMMUNITY)
Admission: EM | Admit: 2019-04-07 | Discharge: 2019-04-07 | Disposition: A | Discharge status: Home / Self Care | Payer: Medicaid Other | Attending: Emergency Medicine | Admitting: Emergency Medicine

## 2019-04-07 DIAGNOSIS — Z79899 Other long term (current) drug therapy: Secondary | ICD-10-CM | POA: Diagnosis not present

## 2019-04-07 DIAGNOSIS — I10 Essential (primary) hypertension: Secondary | ICD-10-CM | POA: Diagnosis not present

## 2019-04-07 DIAGNOSIS — R1032 Left lower quadrant pain: Secondary | ICD-10-CM | POA: Insufficient documentation

## 2019-04-07 DIAGNOSIS — F319 Bipolar disorder, unspecified: Secondary | ICD-10-CM | POA: Insufficient documentation

## 2019-04-07 DIAGNOSIS — Z87891 Personal history of nicotine dependence: Secondary | ICD-10-CM | POA: Diagnosis not present

## 2019-04-07 DIAGNOSIS — N200 Calculus of kidney: Secondary | ICD-10-CM | POA: Diagnosis not present

## 2019-04-07 DIAGNOSIS — R109 Unspecified abdominal pain: Secondary | ICD-10-CM | POA: Diagnosis not present

## 2019-04-07 LAB — URINALYSIS, ROUTINE W REFLEX MICROSCOPIC
Bacteria, UA: NONE SEEN
Bilirubin Urine: NEGATIVE
Glucose, UA: NEGATIVE mg/dL
Ketones, ur: NEGATIVE mg/dL
Leukocytes,Ua: NEGATIVE
Nitrite: NEGATIVE
Protein, ur: NEGATIVE mg/dL
Specific Gravity, Urine: 1.003 — ABNORMAL LOW (ref 1.005–1.030)
pH: 7 (ref 5.0–8.0)

## 2019-04-07 LAB — CBC
HCT: 40.7 % (ref 36.0–46.0)
Hemoglobin: 13.7 g/dL (ref 12.0–15.0)
MCH: 31.7 pg (ref 26.0–34.0)
MCHC: 33.7 g/dL (ref 30.0–36.0)
MCV: 94.2 fL (ref 80.0–100.0)
Platelets: 203 10*3/uL (ref 150–400)
RBC: 4.32 MIL/uL (ref 3.87–5.11)
RDW: 11.5 % (ref 11.5–15.5)
WBC: 6.3 10*3/uL (ref 4.0–10.5)
nRBC: 0 % (ref 0.0–0.2)

## 2019-04-07 LAB — BASIC METABOLIC PANEL
Anion gap: 14 (ref 5–15)
BUN: 10 mg/dL (ref 6–20)
CO2: 16 mmol/L — ABNORMAL LOW (ref 22–32)
Calcium: 9.1 mg/dL (ref 8.9–10.3)
Chloride: 109 mmol/L (ref 98–111)
Creatinine, Ser: 0.76 mg/dL (ref 0.44–1.00)
GFR calc Af Amer: >60 mL/min (ref >60)
GFR calc non Af Amer: >60 mL/min (ref >60)
Glucose, Bld: 94 mg/dL (ref 70–99)
Potassium: 4.3 mmol/L (ref 3.5–5.1)
Sodium: 139 mmol/L (ref 135–145)

## 2019-04-07 LAB — I-STAT BETA HCG BLOOD, ED (MC, WL, AP ONLY): I-stat hCG, quantitative: <5 m[IU]/mL (ref <5)

## 2019-04-07 MED ORDER — KETOROLAC TROMETHAMINE 10 MG PO TABS: 10.0000 mg | ORAL_TABLET | Freq: Four times a day (QID) | ORAL | 0 refills | Status: DC | PRN

## 2019-04-07 MED ORDER — KETOROLAC TROMETHAMINE 30 MG/ML IJ SOLN
30.0000 mg | Freq: Once | INTRAMUSCULAR | Status: AC
  Administered 2019-04-07: 30 mg via INTRAMUSCULAR
  Filled 2019-04-07: qty 1

## 2019-04-07 NOTE — ED Provider Notes (Signed)
[facility] EMERGENC[facility] DEPARTMENT Provider Note   CSN: [id number] Arrival date & time: 04/07/19  1024     History   Chief Complaint Chief Complaint  Patient presents with   Flank Pain    HPI Michelle [name] is a 28 y.o. female.     HPI   28 year old female presents today with complaints of flank pain.  She notes symptoms started on Saturday with left-sided flank pain, sharp in nature.  She notes this feels like previous kidney stone.  She denies any urinary symptoms but noted small amount of blood on the tissue when wiping after urinating.  She denies any lower abdominal pain nausea vomiting fever, diarrhea or vaginal discharge.  She notes taking tramadol which did not improve her symptoms.  She is not pregnant or breast-feeding.  Past Medical History:  Diagnosis Date   Anxiety    Asthma    Bipolar disorder ([place])    no current med.   Depression    no current med.   Family history of adverse reaction to anesthesia    states mother and sister are hard to wake up post-op   Gestational diabetes    History of seizure 06/20/2012   x 1 - during delivery of child(eclampsia)   Hypertension    Lipoma of lower back 08/2015   Migraine    PTSD (post-traumatic stress disorder)    PTSD (post-traumatic stress disorder)    Seizures ([facility])    Tachycardia    TMJ (dislocation of temporomandibular joint)    Vertigo     Patient Active Problem List   Diagnosis Date Noted   Vaginal itching 01/31/2019   Pyelonephritis 01/16/2019   Dermatitis 01/16/2019   Nephrolithiasis    Uncomplicated asthma    Eustachian tube dysfunction, bilateral 12/16/2018   Bee sting allergy 11/06/2018   Allergic reaction 11/04/2018   Mild intermittent asthma without complication 99991111   Chronic rhinitis 11/04/2018   Chronic urticaria 10/04/2018   Easy bruising 11/16/2017   Anxiety 10/27/2016   SUI (stress urinary incontinence, female) 04/10/2016    Migraine 02/10/2016   PTSD 03/02/2010   Tobacco use disorder 02/07/2008   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    Past Surgical History:  Procedure Laterality Date   ABDOMINAL HYSTERECTOMY     ADENOIDECTOMY, TONSILLECTOMY AND MYRINGOTOMY WITH TUBE PLACEMENT  1990's   APPENDECTOMY     CESAREAN SECTION  06/20/2012   Procedure: CESAREAN SECTION;  Surgeon: Emily Filbert, MD;  Location: [facility];  Service: Obstetrics;  Laterality: N/A;   LAPAROSCOPIC APPENDECTOMY  07/25/2012   Procedure: APPENDECTOMY LAPAROSCOPIC;  Surgeon: Zenovia Jarred, MD;  Location: [facility];  Service: General;  Laterality: N/A;   LAPAROSCOPIC TUBAL LIGATION Bilateral 08/04/2014   Procedure: BILATERAL LAPAROSCOPIC TUBAL LIGATION;  Surgeon: Woodroe Mode, MD;  Location: [facility];  Service: Gynecology;  Laterality: Bilateral;   LIPOMA EXCISION N/A 09/16/2015   Procedure: EXCISION LIPOMA LUMBAR REGION;  Surgeon: Donnie Mesa, MD;  Location: [facility];  Service: General;  Laterality: N/A;   TONSILLECTOMY     WISDOM TOOTH EXTRACTION       OB History    Gravida  3   Para  3   Term  2   Preterm  1   AB  0   Living  3     SAB  0   TAB  0   Ectopic  0   Multiple  0   Live Births  1        Obstetric Comments  C-Section Indication: Non-reassuring fetal tracing, growth delay & marked proteinuria & Pre-Eclampsia Eclamptic Seizure during C-section delivery         Home Medications    Prior to Admission medications   Medication Sig Start Date End Date Taking? Authorizing Provider  albuterol (PROAIR HFA) 108 (90 Base) MCG/ACT inhaler Inhale 2 puffs into the lungs every 6 (six) hours as needed for wheezing or shortness of breath.    [provider]  cetirizine (ZYRTEC) 10 MG tablet Take 1 tablet (10 mg total) by mouth daily. 01/06/19   Garnet Sierras, DO  diphenhydrAMINE-zinc acetate (BENADRYL EXTRA STRENGTH) cream Apply 1 application topically 3 (three) times daily  as needed for itching. 01/16/19   Garnet Sierras, DO  EPINEPHrine (EPIPEN 2-PAK) 0.3 mg/0.3 mL IJ SOAJ injection Inject 0.3 mLs (0.3 mg total) into the muscle once as needed for up to 1 dose for anaphylaxis. 11/04/18   Garnet Sierras, DO  fluticasone (FLONASE) 50 MCG/ACT nasal spray Place 1 spray into both nostrils daily. 01/06/19   Garnet Sierras, DO  ibuprofen (ADVIL) 600 MG tablet Take 1 tablet (600 mg total) by mouth every 8 (eight) hours as needed. 01/16/19   Bonnita Hollow, MD  ipratropium-albuterol (DUONEB) 0.5-2.5 (3) MG/3ML SOLN Take 3 mLs by nebulization every 4 (four) hours as needed. 10/13/18   Raylene Everts, MD  ketorolac (TORADOL) 10 MG tablet Take 1 tablet (10 mg total) by mouth every 6 (six) hours as needed. 04/07/19   Juliya Magill, Dellis Filbert, PA-C  oxyCODONE (OXY IR/ROXICODONE) 5 MG immediate release tablet Take 1 tablet (5 mg total) by mouth every 6 (six) hours as needed for severe pain or breakthrough pain. 01/14/19   Meccariello, Bernita Raisin, DO  polyethylene glycol (MIRALAX / GLYCOLAX) 17 g packet Take 17 g by mouth daily. 01/15/19   Meccariello, Bernita Raisin, DO  RETIN-A 0.025 % cream Apply 1 application topically See admin instructions. Apply a pea-sized amount to the face nightly 09/27/18   [provider]  senna (SENOKOT) 8.6 MG TABS tablet Take 1 tablet (8.6 mg total) by mouth daily as needed for mild constipation. 01/14/19   Meccariello, Bernita Raisin, DO  tamsulosin (FLOMAX) 0.4 MG CAPS capsule Take 1 capsule (0.4 mg total) by mouth daily after supper. 01/07/19   Raylene Everts, MD  traMADol (ULTRAM) 50 MG tablet Take 1 tablet (50 mg total) by mouth every 6 (six) hours as needed. 02/28/19   Loura Halt A, NP  triamcinolone cream (KENALOG) 0.1 % Apply 1 application topically 2 (two) times daily. 10/19/18   Wieters, Hallie C, PA-C  triamcinolone ointment (KENALOG) 0.1 % Apply 1 application topically 2 (two) times daily. Use below the neck twice a day as needed. 01/16/19   Garnet Sierras, DO    Family  History Family History  Problem Relation Age of Onset   Breast cancer Paternal Grandmother    Colon cancer Paternal Grandfather    Colon cancer Maternal Grandfather    Colon polyps Maternal Aunt    Diabetes Mother    Anesthesia problems Mother        hard to wake up post-op   Diabetes Sister    Anesthesia problems Sister        hard to wake up post-op   Diabetes Father    Heart disease Father    Eczema Neg Hx    Allergic rhinitis Neg Hx  Asthma Neg Hx     Social History Social History   Tobacco Use   Smoking status: Former Smoker    Packs/day: 0.00    Years: 14.00    Pack years: 0.00    Types: Cigarettes    Quit date: 03/17/2017    Years since quitting: 2.0   Smokeless tobacco: Never Used  Substance Use Topics   Alcohol use: No   Drug use: No     Allergies   Adhesive [tape], Bee venom, and Oatmeal   Review of Systems Review of Systems  All other systems reviewed and are negative.    Physical Exam Updated Vital Signs BP 126/72 (BP Location: Right Arm)    Pulse (!) 102    Temp 98.5 F (36.9 C) (Oral)    Resp 16    LMP 08/14/2016 (Exact Date)    SpO2 100%   Physical Exam Vitals signs and nursing note reviewed.  Constitutional:      Appearance: She is well-developed.  HENT:     Head: Normocephalic and atraumatic.  Eyes:     General: No scleral icterus.       Right eye: No discharge.        Left eye: No discharge.     Conjunctiva/sclera: Conjunctivae normal.     Pupils: Pupils are equal, round, and reactive to light.  Neck:     Musculoskeletal: Normal range of motion.     Vascular: No JVD.     Trachea: No tracheal deviation.  Pulmonary:     Effort: Pulmonary effort is normal.     Breath sounds: No stridor.  Abdominal:     General: Abdomen is flat. There is no distension.     Palpations: Abdomen is soft.     Tenderness: There is no abdominal tenderness.     Comments: No CVA tenderness  Neurological:     Mental Status: She is  alert and oriented to person, place, and time.     Coordination: Coordination normal.  Psychiatric:        Behavior: Behavior normal.        Thought Content: Thought content normal.        Judgment: Judgment normal.      ED Treatments / Results  Labs (all labs ordered are listed, but only abnormal results are displayed) Labs Reviewed  URINALYSIS, ROUTINE W REFLEX MICROSCOPIC - Abnormal; Notable for the following components:      Result Value   Color, Urine COLORLESS (*)    Specific Gravity, Urine 1.003 (*)    Hgb urine dipstick SMALL (*)    All other components within normal limits  BASIC METABOLIC PANEL - Abnormal; Notable for the following components:   CO2 16 (*)    All other components within normal limits  CBC  I-STAT BETA HCG BLOOD, ED ([facility], [facility], [facility] ONLY)    EKG None  Radiology Ct Renal Stone Study  Result Date: 04/07/2019 CLINICAL DATA:  Flank pain. Stone disease suspected. Left mid abdominal pain since Saturday. History of stones. EXAM: CT ABDOMEN AND PELVIS WITHOUT CONTRAST TECHNIQUE: Multidetector CT imaging of the abdomen and pelvis was performed following the standard protocol without IV contrast. COMPARISON:  January 12, 2019 FINDINGS: Lower chest: The lung bases are clear. The heart size is normal. Hepatobiliary: The liver is normal. Normal gallbladder.There is no biliary ductal dilation. Pancreas: Normal contours without ductal dilatation. No peripancreatic fluid collection. Spleen: No splenic laceration or hematoma. Adrenals/Urinary Tract: --Adrenal glands: No adrenal hemorrhage. --Right  kidney/ureter: Multiple nonobstructing stones are noted. There is no hydronephrosis. --Left kidney/ureter: Multiple nonobstructing stones are noted. There is no hydronephrosis. --Urinary bladder: Unremarkable. Stomach/Bowel: --Stomach/Duodenum: No hiatal hernia or other gastric abnormality. Normal duodenal course and caliber. --Small bowel: No dilatation or inflammation. --Colon: No focal  abnormality. --Appendix: Surgically absent. Vascular/Lymphatic: Normal course and caliber of the major abdominal vessels. --No retroperitoneal lymphadenopathy. --No mesenteric lymphadenopathy. --No pelvic or inguinal lymphadenopathy. Reproductive: Status post hysterectomy. No adnexal mass. Other: No ascites or free air. The abdominal wall is normal. Musculoskeletal. No acute displaced fractures. There is a small amount of subcutaneous gas in the right gluteal region which is presumably related to a subcutaneous injection at this location. IMPRESSION: 1. No acute abdominopelvic abnormality. 2. Bilateral nonobstructive nephrolithiasis. Electronically Signed   By: Constance Holster M.D.   On: 04/07/2019 17:26    Procedures Procedures (including critical care time)  Medications Ordered in ED Medications  ketorolac (TORADOL) 30 MG/ML injection 30 mg (30 mg Intramuscular Given 04/07/19 1403)     Initial Impression / Assessment and Plan / ED Course  I have reviewed the triage vital signs and the nursing notes.  Pertinent labs & imaging results that were available during my care of the patient were reviewed by me and considered in my medical decision making (see chart for details).        Assessment/Plan: 28 year old female presents today with flank pain.  Concern for kidney stone.  CT reassuring with nonobstructive nephrolithiasis.  No signs of urinary tract infection.  Patient's pain improved with Toradol.  Question recently passed stone.  Discharged with pain medication strict return precautions.  Verbalized understanding and agreement to today's plan had no further questions or concerns.   Final Clinical Impressions(s) / ED Diagnoses   Final diagnoses:  Flank pain    ED Discharge Orders         Ordered    ketorolac (TORADOL) 10 MG tablet  Every 6 hours PRN     04/07/19 1811           Okey Regal, PA-C 04/08/19 1826    Charlesetta Shanks, MD 04/12/19 1015

## 2019-04-07 NOTE — Discharge Instructions (Addendum)
Please read attached information. If you experience any new or worsening signs or symptoms please return to the emergency room for evaluation. Please follow-up with your primary care provider or specialist as discussed. Please use medication prescribed only as directed and discontinue taking if you have any concerning signs or symptoms.   °

## 2019-04-07 NOTE — ED Notes (Signed)
Pt returned from CT, st's continues to have pain in left flank

## 2019-04-07 NOTE — ED Triage Notes (Signed)
Pt presents with left flank pain since Saturday. Hx of recent hospitalization sepesis r/t kidney stones. She has seen urology but unsure of the plan. Urine is dark and cloudy no blood. Denies fevers, chills.

## 2019-04-21 DIAGNOSIS — H1013 Acute atopic conjunctivitis, bilateral: Secondary | ICD-10-CM | POA: Diagnosis not present

## 2019-04-21 DIAGNOSIS — H40033 Anatomical narrow angle, bilateral: Secondary | ICD-10-CM | POA: Diagnosis not present

## 2019-05-17 DIAGNOSIS — H40033 Anatomical narrow angle, bilateral: Secondary | ICD-10-CM | POA: Diagnosis not present

## 2019-05-17 DIAGNOSIS — G44209 Tension-type headache, unspecified, not intractable: Secondary | ICD-10-CM | POA: Diagnosis not present

## 2019-05-19 DIAGNOSIS — L720 Epidermal cyst: Secondary | ICD-10-CM | POA: Diagnosis not present

## 2019-05-19 DIAGNOSIS — L700 Acne vulgaris: Secondary | ICD-10-CM | POA: Diagnosis not present

## 2019-06-04 ENCOUNTER — Ambulatory Visit (INDEPENDENT_AMBULATORY_CARE_PROVIDER_SITE_OTHER): Payer: Medicaid Other | Admitting: Neurology

## 2019-06-04 ENCOUNTER — Other Ambulatory Visit: Payer: Self-pay

## 2019-06-04 ENCOUNTER — Telehealth: Payer: Self-pay | Admitting: Neurology

## 2019-06-04 DIAGNOSIS — G43009 Migraine without aura, not intractable, without status migrainosus: Secondary | ICD-10-CM

## 2019-06-04 MED ORDER — KETOROLAC TROMETHAMINE 60 MG/2ML IM SOLN
60.0000 mg | Freq: Once | INTRAMUSCULAR | Status: AC
  Administered 2019-06-04: 60 mg via INTRAMUSCULAR

## 2019-06-04 MED ORDER — DIPHENHYDRAMINE HCL 50 MG/ML IJ SOLN
25.0000 mg | Freq: Once | INTRAMUSCULAR | Status: AC
  Administered 2019-06-04: 25 mg via INTRAMUSCULAR

## 2019-06-04 MED ORDER — METOCLOPRAMIDE HCL 5 MG/ML IJ SOLN
10.0000 mg | Freq: Once | INTRAVENOUS | Status: AC
  Administered 2019-06-04: 10 mg via INTRAMUSCULAR

## 2019-06-04 NOTE — Telephone Encounter (Signed)
Patient called and requested an appointment for an injection for a migraine that she's had for two days.

## 2019-06-04 NOTE — Telephone Encounter (Signed)
If she can still come in before office closes, that is fine, thanks

## 2019-06-04 NOTE — Telephone Encounter (Signed)
Called patient and scheduled her for this afternoon for a nurse visit.

## 2019-06-04 NOTE — Telephone Encounter (Signed)
Michelle [name], Please call pt and schedule a nurse visit this pm if pt can come. Thank you.

## 2019-06-04 NOTE — Telephone Encounter (Signed)
Pt has been doing well with Migraines since last visit in April. She has changed her diet. Currently does not take any preventative or onset medications to tx migraines. Pt believes a heavy carb meal may have triggered this episode. Head has hurt for two days. IBU and Tylenol have not helped. She no longer has rx;s for Tramadol or Oxycodone ( these were for a kidney stone). She would like to come in for a HA cocktail today.

## 2019-06-09 ENCOUNTER — Ambulatory Visit
Admission: EM | Admit: 2019-06-09 | Discharge: 2019-06-09 | Disposition: A | Discharge status: Home / Self Care | Payer: Medicaid Other | Attending: Physician Assistant | Admitting: Physician Assistant

## 2019-06-09 DIAGNOSIS — R509 Fever, unspecified: Secondary | ICD-10-CM | POA: Diagnosis not present

## 2019-06-09 DIAGNOSIS — Z20828 Contact with and (suspected) exposure to other viral communicable diseases: Secondary | ICD-10-CM | POA: Diagnosis not present

## 2019-06-09 DIAGNOSIS — R42 Dizziness and giddiness: Secondary | ICD-10-CM | POA: Diagnosis not present

## 2019-06-09 MED ORDER — SODIUM CHLORIDE 0.9 % IV BOLUS
1000.0000 mL | Freq: Once | INTRAVENOUS | Status: AC
  Administered 2019-06-09: 1000 mL via INTRAVENOUS

## 2019-06-09 NOTE — ED Triage Notes (Signed)
Pt c/o headache, dizziness, and diarrhea since last Monday. States thought it was a migraine, saw neurologist and given migraine cocktail with some relief.

## 2019-06-09 NOTE — Discharge Instructions (Addendum)
COVID testing ordered. I would like you to quarantine until testing results. You can take over the counter flonase/nasacort to help with nasal congestion/drainage. If experiencing shortness of breath, trouble breathing, go to the emergency department for further evaluation needed.

## 2019-06-09 NOTE — ED Provider Notes (Signed)
[name]    CSN: [id number] Arrival date & time: 06/09/19  1111      History   Chief Complaint Chief Complaint  Patient presents with  . Headache    HPI Michelle [name] is a 28 y.o. female.   28 year old female with history of migraines coming in for 1 week history of headache, few day history of viral symptoms.  Patient states started having migraines 1 week ago, with headache, photophobia, phonophobia.  She went to her neurologist 06/04/2019 for nurse visit, and was given headache cocktail.  States this resolved symptoms.  However, started having headache to a different location, with nasal congestion, diarrhea, fever for the past few days.  Denies photophobia, phonophobia.  T-max 100.6, responsive to antipyretic.  Denies chills or body aches.  Denies abdominal pain.  Has had 5 episodes of watery diarrhea without melena or hematochezia.  Denies shortness of breath, loss of taste or smell.  She has felt dizzy with ambulation/positional movements, which can cause nausea.  Denies recent head injury, loss of consciousness.  Has increased her fluid intake since feeling this way without much improvement.  She is not currently working, no obvious sick or Covid contact.  Former smoker.     Past Medical History:  Diagnosis Date  . Anxiety   . Asthma   . Bipolar disorder ([place])    no current med.  . Depression    no current med.  . Family history of adverse reaction to anesthesia    states mother and sister are hard to wake up post-op  . Gestational diabetes   . History of seizure 06/20/2012   x 1 - during delivery of child(eclampsia)  . Hypertension   . Lipoma of lower back 08/2015  . Migraine   . PTSD (post-traumatic stress disorder)   . PTSD (post-traumatic stress disorder)   . Seizures ([employer])   . Tachycardia   . TMJ (dislocation of temporomandibular joint)   . Vertigo     Patient Active Problem List   Diagnosis Date Noted  . Vaginal itching 01/31/2019  .  Pyelonephritis 01/16/2019  . Dermatitis 01/16/2019  . Nephrolithiasis   . Uncomplicated asthma   . Eustachian tube dysfunction, bilateral 12/16/2018  . Bee sting allergy 11/06/2018  . Allergic reaction 11/04/2018  . Mild intermittent asthma without complication 99991111  . Chronic rhinitis 11/04/2018  . Chronic urticaria 10/04/2018  . Easy bruising 11/16/2017  . Anxiety 10/27/2016  . SUI (stress urinary incontinence, female) 04/10/2016  . Migraine 02/10/2016  . PTSD 03/02/2010  . Tobacco use disorder 02/07/2008  . 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 ABDOMINAL HYSTERECTOMY    . ADENOIDECTOMY, TONSILLECTOMY AND MYRINGOTOMY WITH TUBE PLACEMENT  1990's  . APPENDECTOMY    . CESAREAN SECTION  06/20/2012   Procedure: CESAREAN SECTION;  Surgeon: Emily Filbert, MD;  Location: [facility];  Service: Obstetrics;  Laterality: N/A;  . LAPAROSCOPIC APPENDECTOMY  07/25/2012   Procedure: APPENDECTOMY LAPAROSCOPIC;  Surgeon: Zenovia Jarred, MD;  Location: [facility];  Service: General;  Laterality: N/A;  . LAPAROSCOPIC TUBAL LIGATION Bilateral 08/04/2014   Procedure: BILATERAL LAPAROSCOPIC TUBAL LIGATION;  Surgeon: Woodroe Mode, MD;  Location: [facility];  Service: Gynecology;  Laterality: Bilateral;  . LIPOMA EXCISION N/A 09/16/2015   Procedure: EXCISION LIPOMA LUMBAR REGION;  Surgeon: Donnie Mesa, MD;  Location: [facility];  Service: General;  Laterality: N/A;  .  TONSILLECTOMY    . WISDOM TOOTH EXTRACTION      OB History    Gravida  3   Para  3   Term  2   Preterm  1   AB  0   Living  3     SAB  0   TAB  0   Ectopic  0   Multiple  0   Live Births  1        Obstetric Comments  C-Section Indication: Non-reassuring fetal tracing, growth delay & marked proteinuria & Pre-Eclampsia Eclamptic Seizure during C-section delivery         Home Medications    Prior to Admission medications    Medication Sig Start Date End Date Taking? Authorizing Provider  albuterol (PROAIR HFA) 108 (90 Base) MCG/ACT inhaler Inhale 2 puffs into the lungs every 6 (six) hours as needed for wheezing or shortness of breath.    [provider]  cetirizine (ZYRTEC) 10 MG tablet Take 1 tablet (10 mg total) by mouth daily. 01/06/19   Garnet Sierras, DO  EPINEPHrine (EPIPEN 2-PAK) 0.3 mg/0.3 mL IJ SOAJ injection Inject 0.3 mLs (0.3 mg total) into the muscle once as needed for up to 1 dose for anaphylaxis. 11/04/18   Garnet Sierras, DO  fluticasone (FLONASE) 50 MCG/ACT nasal spray Place 1 spray into both nostrils daily. 01/06/19   Garnet Sierras, DO  ibuprofen (ADVIL) 600 MG tablet Take 1 tablet (600 mg total) by mouth every 8 (eight) hours as needed. 01/16/19   Bonnita Hollow, MD  ipratropium-albuterol (DUONEB) 0.5-2.5 (3) MG/3ML SOLN Take 3 mLs by nebulization every 4 (four) hours as needed. 10/13/18   Raylene Everts, MD    Family History Family History  Problem Relation Age of Onset  . Breast cancer Paternal Grandmother   . Colon cancer Paternal Grandfather   . Colon cancer Maternal Grandfather   . Colon polyps Maternal Aunt   . Diabetes Mother   . Anesthesia problems Mother        hard to wake up post-op  . Diabetes Sister   . Anesthesia problems Sister        hard to wake up post-op  . Diabetes Father   . Heart disease Father   . Eczema Neg Hx   . Allergic rhinitis Neg Hx   . Asthma Neg Hx     Social History Social History   Tobacco Use  . Smoking status: Former Smoker    Packs/day: 0.00    Years: 14.00    Pack years: 0.00    Types: Cigarettes    Quit date: 03/17/2017    Years since quitting: 2.2  . Smokeless tobacco: Never Used  Substance Use Topics  . Alcohol use: No  . Drug use: No     Allergies   Adhesive [tape], Bee venom, and Oatmeal   Review of Systems Review of Systems  Reason unable to perform ROS: See HPI as above.     Physical Exam Triage Vital Signs ED  Triage Vitals  Enc Vitals Group     BP 06/09/19 1122 (!) 128/92     Pulse Rate 06/09/19 1122 100     Resp 06/09/19 1122 16     Temp 06/09/19 1122 98.6 F (37 C)     Temp Source 06/09/19 1122 Oral     SpO2 06/09/19 1122 98 %     Weight --      Height --  Head Circumference --      Peak Flow --      Pain Score 06/09/19 1124 4     Pain Loc --      Pain Edu? --      Excl. in [place]? --    Orthostatic VS for the past 24 hrs:  BP- Lying Pulse- Lying BP- Sitting Pulse- Sitting BP- Standing at 0 minutes Pulse- Standing at 0 minutes  06/09/19 1153 121/79 84 122/85 89 111/73 111    Updated Vital Signs BP 108/71 (BP Location: Right Arm)   Pulse 83   Temp 98.6 F (37 C) (Oral)   Resp 16   LMP 08/14/2016 (Exact Date)   SpO2 98%   Physical Exam Constitutional:      General: She is not in acute distress.    Appearance: Normal appearance. She is not ill-appearing, toxic-appearing or diaphoretic.  HENT:     Head: Normocephalic and atraumatic.     Mouth/Throat:     Mouth: Mucous membranes are moist.     Pharynx: Oropharynx is clear. Uvula midline.  Eyes:     Extraocular Movements: Extraocular movements intact.     Conjunctiva/sclera: Conjunctivae normal.     Pupils: Pupils are equal, round, and reactive to light.  Neck:     Musculoskeletal: Normal range of motion and neck supple. No pain with movement, spinous process tenderness or muscular tenderness.  Cardiovascular:     Rate and Rhythm: Normal rate and regular rhythm.     Heart sounds: Normal heart sounds. No murmur. No friction rub. No gallop.   Pulmonary:     Effort: Pulmonary effort is normal. No accessory muscle usage, prolonged expiration, respiratory distress or retractions.     Comments: Lungs clear to auscultation without adventitious lung sounds. Skin:    General: Skin is warm and dry.  Neurological:     General: No focal deficit present.     Mental Status: She is alert and oriented to person, place, and time.       UC Treatments / Results  Labs (all labs ordered are listed, but only abnormal results are displayed) Labs Reviewed  NOVEL CORONAVIRUS, NAA    EKG   Radiology No results found.  Procedures Procedures (including critical care time)  Medications Ordered in UC Medications  sodium chloride 0.9 % bolus 1,000 mL (0 mLs Intravenous Stopped 06/09/19 1249)    Initial Impression / Assessment and Plan / UC Course  I have reviewed the triage vital signs and the nursing notes.  Pertinent labs & imaging results that were available during my care of the patient were reviewed by me and considered in my medical decision making (see chart for details).    Patient orthostatic and symptomatic. Has increased her fluid intake at home without relief. Will provide IV fluids in office. Patient with improvement of symptoms after IV fluids.   COVID test ordered. Patient to quarantine until testing results return. No alarming signs on exam.  Patient speaking in full sentences without respiratory distress.  Symptomatic treatment discussed.  Push fluids.  Return precautions given.  Patient expresses understanding and agrees to plan.  Final Clinical Impressions(s) / UC Diagnoses   Final diagnoses:  Fever, unspecified  Orthostatic dizziness    ED Prescriptions    None     PDMP not reviewed this encounter.   Ok Edwards, PA-C 06/09/19 1339

## 2019-06-11 LAB — NOVEL CORONAVIRUS, NAA: SARS-CoV-2, NAA: NOT DETECTED

## 2019-06-27 ENCOUNTER — Other Ambulatory Visit: Payer: Self-pay | Admitting: Family Medicine

## 2019-08-04 DIAGNOSIS — H5213 Myopia, bilateral: Secondary | ICD-10-CM | POA: Diagnosis not present

## 2019-08-11 ENCOUNTER — Encounter: Payer: Self-pay | Admitting: Allergy

## 2019-08-11 ENCOUNTER — Other Ambulatory Visit: Payer: Self-pay

## 2019-08-11 ENCOUNTER — Ambulatory Visit: Payer: Medicaid Other | Admitting: Allergy

## 2019-08-11 VITALS — BP 102/76 | HR 80 | Temp 98.2°F | Resp 18 | Ht 62.0 in

## 2019-08-11 DIAGNOSIS — J4520 Mild intermittent asthma, uncomplicated: Secondary | ICD-10-CM

## 2019-08-11 DIAGNOSIS — Z91030 Bee allergy status: Secondary | ICD-10-CM

## 2019-08-11 DIAGNOSIS — J310 Chronic rhinitis: Secondary | ICD-10-CM | POA: Diagnosis not present

## 2019-08-11 DIAGNOSIS — L508 Other urticaria: Secondary | ICD-10-CM

## 2019-08-11 MED ORDER — CETIRIZINE HCL 10 MG PO TABS: 20.0000 mg | ORAL_TABLET | Freq: Two times a day (BID) | ORAL | 2 refills | Status: DC

## 2019-08-11 MED ORDER — FAMOTIDINE 20 MG PO TABS: 20.0000 mg | ORAL_TABLET | Freq: Two times a day (BID) | ORAL | 2 refills | Status: DC

## 2019-08-11 NOTE — Progress Notes (Signed)
Follow Up Note  RE: Michelle [name] MRN: [id number] DOB: [birth year] Date of Office Visit: 08/11/2019  Referring provider: Rory Percy, DO Primary care provider: Rory Percy, DO  Chief Complaint: Rash (getting worse )  History of Present Illness: I had the pleasure of seeing Michelle [name] for a follow up visit at the [facility] [place] [facility] [place] on 08/11/2019. She is a 29 y.o. female, who is being followed for dermatitis, chronic urticaria, bee sting reaction, chronic rhinitis, asthma. Her previous allergy office visit was on 01/16/2019 with Dr. Maudie Mercury. Today is new complaint visit of new rash.  Rash  About 1 month ago she started to break out more frequently and almost daily. She had swelling in her right ear and right hand.  She did increase her zyrtec to 47m twice a day with no benefit. Symptoms worse at night.  Denies any changes in diet, medications, personal are products or recent infections. No increased stress.   Prior to 1 month, her hives were under control with minor breakthroughs with taking daily zyrtec.   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   Chronic rhinitis Using Flonase as needed.   Mild intermittent asthma without complication Denies any SOB, coughing, wheezing, chest tightness, nocturnal awakenings, ER/urgent care visits or prednisone use since the last visit.  Assessment and Plan: [name] a 29y.o. female with: Chronic urticaria Past history - Reaction to premade oatmeal cookies 1 year ago in the form of rash/hives. Patient used to tolerate these cookies with no issues. Then in March/April noticed difficulty breathing after eating eggs and on Easter had a hive outbreak after eating an egg. 2020 testing negative to foods. 2020 immunocap negative to foods as well. Slightly elevated tryptase, normal c kit.  Interim history - Was doing well up until 1 month ago. Now having daily hives despite increasing zyrtec to 111mBID. Denies any changes.    Increase cetirizine 109mo 2 tablets twice a day. If it causes drowsiness let us [place].  Start famotidine 60m72mice a day. . Monitor symptoms.  . If above regimen does not control symptoms will discuss in more detail starting omalizumab injections.  . Avoid the following potential triggers: alcohol, tight clothing, NSAIDs.  . If you breakout in rash/hives, take pictures.   Mild intermittent asthma without complication Past history - Diagnosed with asthma over 10 years ago and currently has albuterol which helps. Interim history - stable.   May use albuterol rescue inhaler 2 puffs or nebulizer every 4 to 6 hours as needed for shortness of breath, chest tightness, coughing, and wheezing. May use albuterol rescue inhaler 2 puffs 5 to 15 minutes prior to strenuous physical activities. Monitor frequency of use.   Chronic rhinitis Past history - Rhino conjunctivitis symptoms for the past 10+ years during the spring only. Using Claritin prn with good benefit. 2020 skin testing and immunocap was negative to environmental allergies. Interim history - stable.   Continue Flonase 1 spray 1-2 times a day as needed.   Bee sting allergy Past history - Stung by an insect 6 months ago and had hives for 1 week. Needed steroid injection. Bloodwork slightly positive to honeybee. Interim history - no stings since the last visit.   Avoid bee stings and carry Epipen especially when outdoors in the warmer weather.   For mild symptoms you can take over the counter antihistamines such as Benadryl and monitor symptoms closely. If symptoms worsen or if you have severe  symptoms including breathing issues, throat closure, significant swelling, whole body hives, severe diarrhea and vomiting, lightheadedness then inject epinephrine and seek immediate medical care afterwards.  Return in about 4 weeks (around 09/08/2019).  Meds ordered this encounter  Medications  . famotidine (PEPCID) 20 MG tablet    Sig: Take 1  tablet (20 mg total) by mouth 2 (two) times daily.    Dispense:  60 tablet    Refill:  2  . cetirizine (ZYRTEC) 10 MG tablet    Sig: Take 2 tablets (20 mg total) by mouth 2 (two) times daily.    Dispense:  120 tablet    Refill:  2   Diagnostics: None.  Medication List:  Current Outpatient Medications  Medication Sig Dispense Refill  . albuterol (VENTOLIN HFA) 108 (90 Base) MCG/ACT inhaler INHALE 2 PUFFS INTO THE LUNGS EVERY 4 HOURS AS NEEDED FOR WHEEZING OR SHORTNESS OF BREATH 8.5 g 2  . cetirizine (ZYRTEC) 10 MG tablet Take 2 tablets (20 mg total) by mouth 2 (two) times daily. 120 tablet 2  . EPINEPHrine (EPIPEN 2-PAK) 0.3 mg/0.3 mL IJ SOAJ injection Inject 0.3 mLs (0.3 mg total) into the muscle once as needed for up to 1 dose for anaphylaxis. 2 Device 2  . fluticasone (FLONASE) 50 MCG/ACT nasal spray Place 1 spray into both nostrils daily. 15.8 mL 5  . ibuprofen (ADVIL) 600 MG tablet Take 1 tablet (600 mg total) by mouth every 8 (eight) hours as needed. 30 tablet 0  . ipratropium-albuterol (DUONEB) 0.5-2.5 (3) MG/3ML SOLN Take 3 mLs by nebulization every 4 (four) hours as needed. 360 mL 0  . famotidine (PEPCID) 20 MG tablet Take 1 tablet (20 mg total) by mouth 2 (two) times daily. 60 tablet 2   No current facility-administered medications for this visit.   Allergies: Allergies  Allergen Reactions  . Adhesive [Tape] Hives and Other (See Comments)    EKG or heart monitor pads- caused raised, red areas (WELTS) on the skin; "sensitive" pads fell off  . Bee Venom Hives and Other (See Comments)    Welts, also  . Oatmeal Hives    Hives from the waist down and had to seek medical treatment   I reviewed her past medical history, social history, family history, and environmental history and no significant changes have been reported from her previous visit.  Review of Systems  Constitutional: Negative for appetite change, chills, fever and unexpected weight change.  HENT: Negative for  congestion, rhinorrhea and sneezing.   Eyes: Negative for itching.  Respiratory: Negative for cough, chest tightness, shortness of breath and wheezing.   Cardiovascular: Negative for chest pain.  Gastrointestinal: Negative for abdominal pain.  Genitourinary: Negative for difficulty urinating.  Skin: Positive for rash.  Allergic/Immunologic: Negative for environmental allergies.  Neurological: Negative for headaches.   Objective: BP 102/76   Pulse 80   Temp 98.2 F (36.8 C) (Temporal)   Resp 18   Ht '5\' 2"'  (1.575 m)   LMP 08/14/2016 (Exact Date)   SpO2 98%   BMI 26.57 kg/m  Body mass index is 26.57 kg/m. Physical Exam  Constitutional: She is oriented to person, place, and time. She appears well-developed and well-nourished.  HENT:  Head: Normocephalic and atraumatic.  Right Ear: External ear normal.  Left Ear: External ear normal.  Nose: Nose normal.  Mouth/Throat: Oropharynx is clear and moist.  Eyes: Conjunctivae and EOM are normal.  Cardiovascular: Normal rate, regular rhythm and normal heart sounds. Exam reveals no gallop and  no friction rub.  No murmur heard. Pulmonary/Chest: Effort normal and breath sounds normal. She has no wheezes. She has no rales.  Abdominal: Soft.  Musculoskeletal:     Cervical back: Neck supple.  Neurological: She is alert and oriented to person, place, and time.  Skin: Skin is warm. No rash noted.  Psychiatric: She has a normal mood and affect. Her behavior is normal.  Nursing note and vitals reviewed.  Previous notes and tests were reviewed. The plan was reviewed with the patient/family, and all questions/concerned were addressed.  It was my pleasure to see Michelle [name] today and participate in her care. Please feel free to contact me with any questions or concerns.  Sincerely,  Rexene Alberts, DO Allergy & Immunology  [facility] office: [phone] [facility] office: [place] office:  [phone]

## 2019-08-11 NOTE — Assessment & Plan Note (Signed)
Past history - Diagnosed with asthma over 10 years ago and currently has albuterol which helps. Interim history - stable.   May use albuterol rescue inhaler 2 puffs or nebulizer every 4 to 6 hours as needed for shortness of breath, chest tightness, coughing, and wheezing. May use albuterol rescue inhaler 2 puffs 5 to 15 minutes prior to strenuous physical activities. Monitor frequency of use.

## 2019-08-11 NOTE — Patient Instructions (Addendum)
Increase cetirizine 10mg  to 2 tablets twice a day. If it causes drowsiness let us know. Start famotidine 20mg  twice a day.  . Monitor symptoms.  . Read about omalizumab injections.   Follow up in 4 weeks or sooner if needed.

## 2019-08-11 NOTE — Assessment & Plan Note (Signed)
Past history - Stung by an insect 6 months ago and had hives for 1 week. Needed steroid injection. Bloodwork slightly positive to honeybee. Interim history - no stings since the last visit.   Avoid bee stings and carry Epipen especially when outdoors in the warmer weather.   For mild symptoms you can take over the counter antihistamines such as Benadryl and monitor symptoms closely. If symptoms worsen or if you have severe symptoms including breathing issues, throat closure, significant swelling, whole body hives, severe diarrhea and vomiting, lightheadedness then inject epinephrine and seek immediate medical care afterwards.

## 2019-08-11 NOTE — Assessment & Plan Note (Signed)
Past history - Reaction to premade oatmeal cookies 1 year ago in the form of rash/hives. Patient used to tolerate these cookies with no issues. Then in March/April noticed difficulty breathing after eating eggs and on Easter had a hive outbreak after eating an egg. 2020 testing negative to foods. 2020 immunocap negative to foods as well. Slightly elevated tryptase, normal c kit.  Interim history - Was doing well up until 1 month ago. Now having daily hives despite increasing zyrtec to 48m BID. Denies any changes.   Increase cetirizine 146mto 2 tablets twice a day. If it causes drowsiness let us[place]ow.  Start famotidine 2016mwice a day. . Monitor symptoms.  . If above regimen does not control symptoms will discuss in more detail starting omalizumab injections.  . Avoid the following potential triggers: alcohol, tight clothing, NSAIDs.  . If you breakout in rash/hives, take pictures.

## 2019-08-11 NOTE — Assessment & Plan Note (Signed)
Past history - Rhino conjunctivitis symptoms for the past 10+ years during the spring only. Using Claritin prn with good benefit. 2020 skin testing and immunocap was negative to environmental allergies. Interim history - stable.   Continue Flonase 1 spray 1-2 times a day as needed.

## 2019-08-13 DIAGNOSIS — R21 Rash and other nonspecific skin eruption: Secondary | ICD-10-CM | POA: Diagnosis not present

## 2019-08-20 ENCOUNTER — Encounter: Payer: Self-pay | Admitting: Family Medicine

## 2019-08-20 ENCOUNTER — Ambulatory Visit (INDEPENDENT_AMBULATORY_CARE_PROVIDER_SITE_OTHER): Payer: Medicaid Other | Admitting: Family Medicine

## 2019-08-20 ENCOUNTER — Other Ambulatory Visit: Payer: Self-pay

## 2019-08-20 VITALS — BP 104/64 | HR 111 | Wt 135.2 lb

## 2019-08-20 DIAGNOSIS — Z23 Encounter for immunization: Secondary | ICD-10-CM | POA: Diagnosis not present

## 2019-08-20 DIAGNOSIS — R21 Rash and other nonspecific skin eruption: Secondary | ICD-10-CM

## 2019-08-20 NOTE — Progress Notes (Signed)
   CHIEF COMPLAINT / HPI: rash on back, joint pain, wants referral to rheumatology  Patient endorses intermittent itching on her upper back and cheeks for the past 6 months.  Itching is described as sharp, pins-and-needles sensation.  She notices this more when she wakes up in the morning and gets out of the shower.  When she scratches, it hurts.  She has not noticed any rash on her back but does notice a red rash on her bilateral cheeks associated with the itching.  She denies any bleeding or peeling of the skin, mouth sores, joint swelling, fevers.  Does endorse some achiness in her bilateral wrists with occasional aches and pains.  She denies any changes in her soaps, detergents, shampoo.  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  Per patient allergist is thinking about adding Xolair.  She has also tried ice without relief.  Denies known family history of autoimmune disorders.  Patient is concerned she may have lupus.  PERTINENT  PMH / PSH: Migraine, mild intermittent asthma, bipolar disorder, tobacco use, PTSD, stress urinary incontinence  OBJECTIVE: BP 104/64   Pulse (!) 111   Wt 135 lb 3.2 oz (61.3 kg)   LMP 08/14/2016 (Exact Date)   SpO2 99%   BMI 24.73 kg/m   General: well nourished, well developed, in no acute distress with non-toxic appearance CV: regular rate and rhythm without murmurs, rubs, or gallops Lungs: clear to auscultation bilaterally with normal work of breathing Skin: warm, dry, no rashes to face or back. Occasional scattered closed comedones on upper back.  Occasional excoriations on lower leg.  No hives appreciated.  ASSESSMENT / PLAN:  Rash 30-month history of intermittent red rash with tingling, burning sensation.  Exam today without rash, hives.  Sensation may be related to chronic idiopathic urticaria given intense itching for which she is followed by an allergist.  No known  personal or family history of autoimmune disorders to suggest autoimmune etiology, also without systemic symptoms.  No known external trigger and exam not convincing for contact dermatitis.  Recommend follow-up with allergist to consider Xolair as previously discussed.  Given patient's concerns, will obtain ANA, anti-CCP, sed rate.  Referral placed to rheumatology per patient's request.    Rory Percy, [place]

## 2019-08-20 NOTE — Patient Instructions (Addendum)
It was great to see you!  Our plans for today:  - I think your rash is likely related to your chronic urticaria.  Continue to follow with your allergist as directed. - We are checking some labs today, we will call you or send you a letter with these results. - We will also send you to a rheumatologist per your request for second opinion. - Make appointment soon to update your Pap smear.  Take care and seek immediate care sooner if you develop any concerns.   Dr. [facility]

## 2019-08-22 LAB — CYCLIC CITRUL PEPTIDE ANTIBODY, IGG/IGA: Cyclic Citrullin Peptide Ab: 5 units (ref 0–19)

## 2019-08-22 LAB — SEDIMENTATION RATE: Sed Rate: 3 mm/hr (ref 0–32)

## 2019-08-22 LAB — ANA W/REFLEX IF POSITIVE: Anti Nuclear Antibody (ANA): NEGATIVE

## 2019-08-22 NOTE — Assessment & Plan Note (Addendum)
58-month history of intermittent red rash with tingling, burning sensation.  Exam today without rash, hives.  Sensation may be related to chronic idiopathic urticaria given intense itching for which she is followed by an allergist.  No known personal or family history of autoimmune disorders to suggest autoimmune etiology, also without systemic symptoms.  No known external trigger and exam not convincing for contact dermatitis.  Recommend follow-up with allergist to consider Xolair as previously discussed.  Given patient's concerns, will obtain ANA, anti-CCP, sed rate.  Referral placed to rheumatology per patient's request.

## 2019-08-24 DIAGNOSIS — H5213 Myopia, bilateral: Secondary | ICD-10-CM | POA: Diagnosis not present

## 2019-09-08 ENCOUNTER — Ambulatory Visit: Payer: Medicaid Other | Admitting: Allergy

## 2019-09-08 NOTE — Progress Notes (Deleted)
Follow Up Note  RE: Michelle [name] MRN: [id number] DOB: [birth year] Date of Office Visit: 09/08/2019  Referring provider: Rory Percy, DO Primary care provider: Rory Percy, DO  Chief Complaint: No chief complaint on file.  History of Present Illness: I had the pleasure of seeing Michelle [name] for a follow up visit at the [facility] [place] [facility] [place] on 09/08/2019. She is a 29 y.o. female, who is being followed for urticaria, asthma, rhinitis, bee sting allergy. Her previous allergy office visit was on 08/11/2019 with Dr. Maudie Mercury. Today is a regular follow up visit.  Chronic urticaria Past history - Reaction to premade oatmeal cookies 1 year ago in the form of rash/hives. Patient used to tolerate these cookies with no issues. Then in March/April noticed difficulty breathing after eating eggs and on Easter had a hive outbreak after eating an egg. 2020 testing negative to foods. 2020 immunocap negative to foods as well. Slightly elevated tryptase, normal c kit.  Interim history - Was doing well up until 1 month ago. Now having daily hives despite increasing zyrtec to 44m BID. Denies any changes.   Increase cetirizine 188mto 2 tablets twice a day. If it causes drowsiness let us[place]ow.  Start famotidine 2055mwice a day.  Monitor symptoms.   If above regimen does not control symptoms will discuss in more detail starting omalizumab injections.   Avoid the following potential triggers: alcohol, tight clothing, NSAIDs.   If you breakout in rash/hives, take pictures.   Mild intermittent asthma without complication Past history - Diagnosed with asthma over 10 years ago and currently has albuterol which helps. Interim history - stable.   May use albuterol rescue inhaler 2 puffs or nebulizer every 4 to 6 hours as needed for shortness of breath, chest tightness, coughing, and wheezing. May use albuterol rescue inhaler 2 puffs 5 to 15 minutes prior to strenuous physical activities. Monitor  frequency of use.   Chronic rhinitis Past history - Rhino conjunctivitis symptoms for the past 10+ years during the spring only. Using Claritin prn with good benefit. 2020 skin testing and immunocap was negative to environmental allergies. Interim history - stable.   Continue Flonase 1 spray 1-2 times a day as needed.   Bee sting allergy Past history - Stung by an insect 6 months ago and had hives for 1 week. Needed steroid injection. Bloodwork slightly positive to honeybee. Interim history - no stings since the last visit.   Avoid bee stings and carry Epipen especially when outdoors in the warmer weather.   For mild symptoms you can take over the counter antihistamines such as Benadryl and monitor symptoms closely. If symptoms worsen or if you have severe symptoms including breathing issues, throat closure, significant swelling, whole body hives, severe diarrhea and vomiting, lightheadedness then inject epinephrine and seek immediate medical care afterwards.  Return in about 4 weeks (around 09/08/2019).  Assessment and Plan: [name] a 28 78o. female with: No problem-specific Assessment & Plan notes found for this encounter.  No follow-ups on file.  No orders of the defined types were placed in this encounter.  Lab Orders  No laboratory test(s) ordered today    Diagnostics: Spirometry:  Tracings reviewed. Her effort: {Blank single:19197::"Good reproducible efforts.","It was hard to get consistent efforts and there is a question as to whether this reflects a maximal maneuver.","Poor effort, data can not be interpreted."} FVC: ***L FEV1: ***L, ***% predicted FEV1/FVC ratio: ***% Interpretation: {Blank single:19197::"Spirometry consistent with mild obstructive disease","Spirometry consistent  with moderate obstructive disease","Spirometry consistent with severe obstructive disease","Spirometry consistent with possible restrictive disease","Spirometry consistent with mixed  obstructive and restrictive disease","Spirometry uninterpretable due to technique","Spirometry consistent with normal pattern","No overt abnormalities noted given today's efforts"}.  Please see scanned spirometry results for details.  Skin Testing: {Blank single:19197::"Select foods","Environmental allergy panel","Environmental allergy panel and select foods","Food allergy panel","None","Deferred due to recent antihistamines use"}. Positive test to: ***. Negative test to: ***.  Results discussed with patient/family.   Medication List:  Current Outpatient Medications  Medication Sig Dispense Refill  . albuterol (VENTOLIN HFA) 108 (90 Base) MCG/ACT inhaler INHALE 2 PUFFS INTO THE LUNGS EVERY 4 HOURS AS NEEDED FOR WHEEZING OR SHORTNESS OF BREATH 8.5 g 2  . cetirizine (ZYRTEC) 10 MG tablet Take 2 tablets (20 mg total) by mouth 2 (two) times daily. 120 tablet 2  . EPINEPHrine (EPIPEN 2-PAK) 0.3 mg/0.3 mL IJ SOAJ injection Inject 0.3 mLs (0.3 mg total) into the muscle once as needed for up to 1 dose for anaphylaxis. 2 Device 2  . famotidine (PEPCID) 20 MG tablet Take 1 tablet (20 mg total) by mouth 2 (two) times daily. 60 tablet 2  . fluticasone (FLONASE) 50 MCG/ACT nasal spray Place 1 spray into both nostrils daily. 15.8 mL 5  . ibuprofen (ADVIL) 600 MG tablet Take 1 tablet (600 mg total) by mouth every 8 (eight) hours as needed. 30 tablet 0  . ipratropium-albuterol (DUONEB) 0.5-2.5 (3) MG/3ML SOLN Take 3 mLs by nebulization every 4 (four) hours as needed. 360 mL 0  . triamcinolone ointment (KENALOG) 0.1 % Apply topically.     No current facility-administered medications for this visit.   Allergies: Allergies  Allergen Reactions  . Adhesive [Tape] Hives and Other (See Comments)    EKG or heart monitor pads- caused raised, red areas (WELTS) on the skin; "sensitive" pads fell off  . Bee Venom Hives and Other (See Comments)    Welts, also  . Oatmeal Hives    Hives from the waist down and had to  seek medical treatment   I reviewed her past medical history, social history, family history, and environmental history and no significant changes have been reported from her previous visit.  Review of Systems  Constitutional: Negative for appetite change, chills, fever and unexpected weight change.  HENT: Negative for congestion, rhinorrhea and sneezing.   Eyes: Negative for itching.  Respiratory: Negative for cough, chest tightness, shortness of breath and wheezing.   Cardiovascular: Negative for chest pain.  Gastrointestinal: Negative for abdominal pain.  Genitourinary: Negative for difficulty urinating.  Skin: Positive for rash.  Allergic/Immunologic: Negative for environmental allergies.  Neurological: Negative for headaches.   Objective: LMP 08/14/2016 (Exact Date)  There is no height or weight on file to calculate BMI. Physical Exam  Constitutional: She is oriented to person, place, and time. She appears well-developed and well-nourished.  HENT:  Head: Normocephalic and atraumatic.  Right Ear: External ear normal.  Left Ear: External ear normal.  Nose: Nose normal.  Mouth/Throat: Oropharynx is clear and moist.  Eyes: Conjunctivae and EOM are normal.  Cardiovascular: Normal rate, regular rhythm and normal heart sounds. Exam reveals no gallop and no friction rub.  No murmur heard. Pulmonary/Chest: Effort normal and breath sounds normal. She has no wheezes. She has no rales.  Abdominal: Soft.  Musculoskeletal:     Cervical back: Neck supple.  Neurological: She is alert and oriented to person, place, and time.  Skin: Skin is warm. No rash noted.  Psychiatric: She has  a normal mood and affect. Her behavior is normal.  Nursing note and vitals reviewed.  Previous notes and tests were reviewed. The plan was reviewed with the patient/family, and all questions/concerned were addressed.  It was my pleasure to see Teva today and participate in her care. Please feel free to  contact me with any questions or concerns.  Sincerely,  Rexene Alberts, DO Allergy & Immunology  [facility] office: [phone] [facility] office: [place] office: [phone]

## 2019-09-10 ENCOUNTER — Telehealth: Payer: Self-pay | Admitting: Family Medicine

## 2019-09-10 NOTE — Telephone Encounter (Signed)
Error

## 2019-09-25 ENCOUNTER — Telehealth: Payer: Self-pay | Admitting: Neurology

## 2019-09-25 ENCOUNTER — Other Ambulatory Visit: Payer: Self-pay

## 2019-09-25 ENCOUNTER — Ambulatory Visit (HOSPITAL_COMMUNITY)
Admission: EM | Admit: 2019-09-25 | Discharge: 2019-09-25 | Disposition: A | Discharge status: Home / Self Care | Payer: Medicaid Other | Attending: Family Medicine | Admitting: Family Medicine

## 2019-09-25 ENCOUNTER — Encounter (HOSPITAL_COMMUNITY): Payer: Self-pay

## 2019-09-25 DIAGNOSIS — G43819 Other migraine, intractable, without status migrainosus: Secondary | ICD-10-CM

## 2019-09-25 MED ORDER — KETOROLAC TROMETHAMINE 60 MG/2ML IM SOLN
60.0000 mg | Freq: Once | INTRAMUSCULAR | Status: AC
  Administered 2019-09-25: 60 mg via INTRAMUSCULAR

## 2019-09-25 MED ORDER — KETOROLAC TROMETHAMINE 60 MG/2ML IM SOLN
INTRAMUSCULAR | Status: AC
  Filled 2019-09-25: qty 2

## 2019-09-25 MED ORDER — DEXAMETHASONE SODIUM PHOSPHATE 10 MG/ML IJ SOLN
10.0000 mg | Freq: Once | INTRAMUSCULAR | Status: AC
  Administered 2019-09-25: 10 mg via INTRAMUSCULAR

## 2019-09-25 MED ORDER — DEXAMETHASONE SODIUM PHOSPHATE 10 MG/ML IJ SOLN
INTRAMUSCULAR | Status: AC
  Filled 2019-09-25: qty 1

## 2019-09-25 NOTE — ED Triage Notes (Signed)
Pt presents with ongoing migraine since Monday night; pt states she usually has recurring migraines but she hit her head Monday night on a floating shelf in her home which iniated the migraine.

## 2019-09-25 NOTE — Discharge Instructions (Addendum)
Home to rest Follow up with your neurologist if headaches do not improve

## 2019-09-25 NOTE — ED Provider Notes (Signed)
[facility]    CSN: [id number] Arrival date & time: 09/25/19  1630      History   Chief Complaint Chief Complaint  Patient presents with  . Migraine    HPI Michelle [name] is a 29 y.o. female.   HPI Patient is here for a migraine She states is been present for the last 4 days She states she has a significant headache, behind her eyes and across her forehead It is not responding to Tylenol and ibuprofen Usually gets good results from an injection here at the urgent care No head injury, no infection symptoms, no vomiting, no visual changes Patient states she did bump her head on a shelf the day before.  Did not have any headache, dizziness, or loss of consciousness at the time  Past Medical History:  Diagnosis Date  . Anxiety   . Asthma   . Bipolar disorder ([place])    no current med.  . Depression    no current med.  . Family history of adverse reaction to anesthesia    states mother and sister are hard to wake up post-op  . Gestational diabetes   . History of seizure 06/20/2012   x 1 - during delivery of child(eclampsia)  . Hypertension   . Lipoma of lower back 08/2015  . Migraine   . PTSD (post-traumatic stress disorder)   . PTSD (post-traumatic stress disorder)   . Seizures ([place])   . Tachycardia   . TMJ (dislocation of temporomandibular joint)   . Vertigo     Patient Active Problem List   Diagnosis Date Noted  . Vaginal itching 01/31/2019  . Pyelonephritis 01/16/2019  . Dermatitis 01/16/2019  . Nephrolithiasis   . Uncomplicated asthma   . Eustachian tube dysfunction, bilateral 12/16/2018  . Bee sting allergy 11/06/2018  . Allergic reaction 11/04/2018  . Mild intermittent asthma without complication 99991111  . Chronic rhinitis 11/04/2018  . Chronic urticaria 10/04/2018  . Rash 11/16/2017  . Easy bruising 11/16/2017  . Anxiety 10/27/2016  . SUI (stress urinary incontinence, female) 04/10/2016  . Migraine 02/10/2016  . PTSD 03/02/2010  .  Tobacco use disorder 02/07/2008  . 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 ABDOMINAL HYSTERECTOMY    . ADENOIDECTOMY, TONSILLECTOMY AND MYRINGOTOMY WITH TUBE PLACEMENT  1990's  . APPENDECTOMY    . CESAREAN SECTION  06/20/2012   Procedure: CESAREAN SECTION;  Surgeon: Emily Filbert, MD;  Location: [facility];  Service: Obstetrics;  Laterality: N/A;  . LAPAROSCOPIC APPENDECTOMY  07/25/2012   Procedure: APPENDECTOMY LAPAROSCOPIC;  Surgeon: Zenovia Jarred, MD;  Location: [facility];  Service: General;  Laterality: N/A;  . LAPAROSCOPIC TUBAL LIGATION Bilateral 08/04/2014   Procedure: BILATERAL LAPAROSCOPIC TUBAL LIGATION;  Surgeon: Woodroe Mode, MD;  Location: [facility];  Service: Gynecology;  Laterality: Bilateral;  . LIPOMA EXCISION N/A 09/16/2015   Procedure: EXCISION LIPOMA LUMBAR REGION;  Surgeon: Donnie Mesa, MD;  Location: [facility];  Service: General;  Laterality: N/A;  . TONSILLECTOMY    . WISDOM TOOTH EXTRACTION      OB History    Gravida  3   Para  3   Term  2   Preterm  1   AB  0   Living  3     SAB  0   TAB  0   Ectopic  0   Multiple  0   Live Births  1        Obstetric Comments  C-Section Indication: Non-reassuring fetal tracing, growth delay & marked proteinuria & Pre-Eclampsia Eclamptic Seizure during C-section delivery         Home Medications    Prior to Admission medications   Medication Sig Start Date End Date Taking? Authorizing Provider  albuterol (VENTOLIN HFA) 108 (90 Base) MCG/ACT inhaler INHALE 2 PUFFS INTO THE LUNGS EVERY 4 HOURS AS NEEDED FOR WHEEZING OR SHORTNESS OF BREATH 06/27/19   Rory Percy, DO  cetirizine (ZYRTEC) 10 MG tablet Take 2 tablets (20 mg total) by mouth 2 (two) times daily. 08/11/19   Garnet Sierras, DO  EPINEPHrine (EPIPEN 2-PAK) 0.3 mg/0.3 mL IJ SOAJ injection Inject 0.3 mLs (0.3 mg total) into the muscle once as needed for up to 1 dose for  anaphylaxis. 11/04/18   Garnet Sierras, DO  famotidine (PEPCID) 20 MG tablet Take 1 tablet (20 mg total) by mouth 2 (two) times daily. 08/11/19   Garnet Sierras, DO  fluticasone (FLONASE) 50 MCG/ACT nasal spray Place 1 spray into both nostrils daily. 01/06/19   Garnet Sierras, DO  ibuprofen (ADVIL) 600 MG tablet Take 1 tablet (600 mg total) by mouth every 8 (eight) hours as needed. 01/16/19   Bonnita Hollow, MD  ipratropium-albuterol (DUONEB) 0.5-2.5 (3) MG/3ML SOLN Take 3 mLs by nebulization every 4 (four) hours as needed. 10/13/18   Raylene Everts, MD  triamcinolone ointment (KENALOG) 0.1 % Apply topically. 08/13/19 08/12/20  [provider]    Family History Family History  Problem Relation Age of Onset  . Breast cancer Paternal Grandmother   . Colon cancer Paternal Grandfather   . Colon cancer Maternal Grandfather   . Colon polyps Maternal Aunt   . Diabetes Mother   . Anesthesia problems Mother        hard to wake up post-op  . Diabetes Sister   . Anesthesia problems Sister        hard to wake up post-op  . Diabetes Father   . Heart disease Father   . Eczema Neg Hx   . Allergic rhinitis Neg Hx   . Asthma Neg Hx     Social History Social History   Tobacco Use  . Smoking status: Former Smoker    Packs/day: 0.00    Years: 14.00    Pack years: 0.00    Types: Cigarettes    Quit date: 03/17/2017    Years since quitting: 2.5  . Smokeless tobacco: Never Used  Substance Use Topics  . Alcohol use: No  . Drug use: No     Allergies   Adhesive [tape], Bee venom, and Oatmeal   Review of Systems Review of Systems  HENT: Negative for congestion.   Eyes: Negative for photophobia and visual disturbance.  Gastrointestinal: Negative for nausea and vomiting.  Neurological: Positive for headaches. Negative for dizziness.     Physical Exam Triage Vital Signs ED Triage Vitals  Enc Vitals Group     BP 09/25/19 1737 (!) 110/57     Pulse Rate 09/25/19 1737 (!) 111     Resp  09/25/19 1737 16     Temp 09/25/19 1737 98.4 F (36.9 C)     Temp Source 09/25/19 1737 Oral     SpO2 09/25/19 1737 100 %     Weight --      Height --      Head Circumference --      Peak Flow --  Pain Score 09/25/19 1750 9     Pain Loc --      Pain Edu? --      Excl. in [place]? --    No data found.  Updated Vital Signs BP (!) 110/57 (BP Location: Left Arm)   Pulse (!) 111   Temp 98.4 F (36.9 C) (Oral)   Resp 16   LMP 08/14/2016 (Exact Date)   SpO2 100%      Physical Exam Constitutional:      General: She is not in acute distress.    Appearance: She is well-developed and normal weight.     Comments: Room is darkened.  Appears uncomfortable  HENT:     Head: Normocephalic and atraumatic.     Nose: No congestion.     Mouth/Throat:     Pharynx: No posterior oropharyngeal erythema.     Comments: Mask in place Eyes:     Extraocular Movements: Extraocular movements intact.     Conjunctiva/sclera: Conjunctivae normal.     Pupils: Pupils are equal, round, and reactive to light.     Comments: Normal eye exam  Cardiovascular:     Rate and Rhythm: Normal rate.  Pulmonary:     Effort: Pulmonary effort is normal. No respiratory distress.  Musculoskeletal:        General: Normal range of motion.     Cervical back: Normal range of motion.  Lymphadenopathy:     Cervical: No cervical adenopathy.  Skin:    General: Skin is warm and dry.  Neurological:     General: No focal deficit present.     Mental Status: She is alert.  Psychiatric:        Mood and Affect: Mood normal.        Behavior: Behavior normal.        Thought Content: Thought content normal.      UC Treatments / Results  Labs (all labs ordered are listed, but only abnormal results are displayed) Labs Reviewed - No data to display  EKG   Radiology No results found.  Procedures Procedures (including critical care time)  Medications Ordered in UC Medications  ketorolac (TORADOL) injection 60 mg  (has no administration in time range)  dexamethasone (DECADRON) injection 10 mg (has no administration in time range)    Initial Impression / Assessment and Plan / UC Course  I have reviewed the triage vital signs and the nursing notes.  Pertinent labs & imaging results that were available during my care of the patient were reviewed by me and considered in my medical decision making (see chart for details).      Final Clinical Impressions(s) / UC Diagnoses   Final diagnoses:  Other migraine without status migrainosus, intractable     Discharge Instructions     Home to rest Follow up with your neurologist if headaches do not improve    ED Prescriptions    None     PDMP not reviewed this encounter.   Raylene Everts, MD 09/25/19 [phone]

## 2019-09-25 NOTE — Telephone Encounter (Signed)
Patient states that she fell and hit her head and has migraine and wants to know if we can do a headache shot for her please call

## 2019-09-25 NOTE — Telephone Encounter (Signed)
spoke with pt she said she didn't fall she stood up fast and hit her head on a shelf and has had a headache every since. Would like a migraine shot to help?

## 2019-09-26 NOTE — Telephone Encounter (Signed)
That is fine, can come in for migraine cocktail, has to have driver. thanks

## 2019-09-26 NOTE — Telephone Encounter (Signed)
Spoke to pt she went to urgent care last night and got a shot to help with her migraine

## 2019-10-21 ENCOUNTER — Other Ambulatory Visit: Payer: Self-pay

## 2019-10-21 ENCOUNTER — Ambulatory Visit (HOSPITAL_COMMUNITY)
Admission: EM | Admit: 2019-10-21 | Discharge: 2019-10-21 | Disposition: A | Discharge status: Home / Self Care | Payer: Medicaid Other | Attending: Family Medicine | Admitting: Family Medicine

## 2019-10-21 ENCOUNTER — Encounter (HOSPITAL_COMMUNITY): Payer: Self-pay

## 2019-10-21 DIAGNOSIS — L03011 Cellulitis of right finger: Secondary | ICD-10-CM

## 2019-10-21 MED ORDER — SULFAMETHOXAZOLE-TRIMETHOPRIM 800-160 MG PO TABS: 1.0000 | ORAL_TABLET | Freq: Two times a day (BID) | ORAL | 0 refills | Status: DC

## 2019-10-21 NOTE — Discharge Instructions (Signed)
Please try buddy taping the fingers  Please try the antibiotic  Please follow up if your symptoms fail to improve.

## 2019-10-21 NOTE — ED Triage Notes (Signed)
Pt presents with possible infection in her finger; pt states she cut it on something a week ago and is having pain in joint areas of finger.

## 2019-10-21 NOTE — ED Provider Notes (Signed)
[facility]    CSN: [id number] Arrival date & time: 10/21/19  1857      History   Chief Complaint Chief Complaint  Patient presents with  . Finger Infection    HPI Michelle [name] is a 29 y.o. female.   She is presenting with right finger pain.  She cut the dorsal PIP joint a few days ago and has noticed some swelling and tenderness in the area.  There is some redness as well.  She has trouble with flexion.  Denies any numbness or tingling.  Symptoms seem to be getting worse.  HPI  Past Medical History:  Diagnosis Date  . Anxiety   . Asthma   . Bipolar disorder ([place])    no current med.  . Depression    no current med.  . Family history of adverse reaction to anesthesia    states mother and sister are hard to wake up post-op  . Gestational diabetes   . History of seizure 06/20/2012   x 1 - during delivery of child(eclampsia)  . Hypertension   . Lipoma of lower back 08/2015  . Migraine   . PTSD (post-traumatic stress disorder)   . PTSD (post-traumatic stress disorder)   . Seizures ([place])   . Tachycardia   . TMJ (dislocation of temporomandibular joint)   . Vertigo     Patient Active Problem List   Diagnosis Date Noted  . Vaginal itching 01/31/2019  . Pyelonephritis 01/16/2019  . Dermatitis 01/16/2019  . Nephrolithiasis   . Uncomplicated asthma   . Eustachian tube dysfunction, bilateral 12/16/2018  . Bee sting allergy 11/06/2018  . Allergic reaction 11/04/2018  . Mild intermittent asthma without complication 99991111  . Chronic rhinitis 11/04/2018  . Chronic urticaria 10/04/2018  . Rash 11/16/2017  . Easy bruising 11/16/2017  . Anxiety 10/27/2016  . SUI (stress urinary incontinence, female) 04/10/2016  . Migraine 02/10/2016  . PTSD 03/02/2010  . Tobacco use disorder 02/07/2008  . 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 ABDOMINAL HYSTERECTOMY    . ADENOIDECTOMY, TONSILLECTOMY  AND MYRINGOTOMY WITH TUBE PLACEMENT  1990's  . APPENDECTOMY    . CESAREAN SECTION  06/20/2012   Procedure: CESAREAN SECTION;  Surgeon: Emily Filbert, MD;  Location: [facility];  Service: Obstetrics;  Laterality: N/A;  . LAPAROSCOPIC APPENDECTOMY  07/25/2012   Procedure: APPENDECTOMY LAPAROSCOPIC;  Surgeon: Zenovia Jarred, MD;  Location: [facility];  Service: General;  Laterality: N/A;  . LAPAROSCOPIC TUBAL LIGATION Bilateral 08/04/2014   Procedure: BILATERAL LAPAROSCOPIC TUBAL LIGATION;  Surgeon: Woodroe Mode, MD;  Location: [facility];  Service: Gynecology;  Laterality: Bilateral;  . LIPOMA EXCISION N/A 09/16/2015   Procedure: EXCISION LIPOMA LUMBAR REGION;  Surgeon: Donnie Mesa, MD;  Location: [facility];  Service: General;  Laterality: N/A;  . TONSILLECTOMY    . WISDOM TOOTH EXTRACTION      OB History    Gravida  3   Para  3   Term  2   Preterm  1   AB  0   Living  3     SAB  0   TAB  0   Ectopic  0   Multiple  0   Live Births  1        Obstetric Comments  C-Section Indication: Non-reassuring fetal tracing, growth delay & marked proteinuria & Pre-Eclampsia Eclamptic Seizure during C-section delivery  Home Medications    Prior to Admission medications   Medication Sig Start Date End Date Taking? Authorizing Provider  albuterol (VENTOLIN HFA) 108 (90 Base) MCG/ACT inhaler INHALE 2 PUFFS INTO THE LUNGS EVERY 4 HOURS AS NEEDED FOR WHEEZING OR SHORTNESS OF BREATH 06/27/19   Rory Percy, DO  cetirizine (ZYRTEC) 10 MG tablet Take 2 tablets (20 mg total) by mouth 2 (two) times daily. 08/11/19   Garnet Sierras, DO  EPINEPHrine (EPIPEN 2-PAK) 0.3 mg/0.3 mL IJ SOAJ injection Inject 0.3 mLs (0.3 mg total) into the muscle once as needed for up to 1 dose for anaphylaxis. 11/04/18   Garnet Sierras, DO  famotidine (PEPCID) 20 MG tablet Take 1 tablet (20 mg total) by mouth 2 (two) times daily. 08/11/19   Garnet Sierras, DO  fluticasone (FLONASE) 50 MCG/ACT nasal spray Place 1  spray into both nostrils daily. 01/06/19   Garnet Sierras, DO  ibuprofen (ADVIL) 600 MG tablet Take 1 tablet (600 mg total) by mouth every 8 (eight) hours as needed. 01/16/19   Bonnita Hollow, MD  ipratropium-albuterol (DUONEB) 0.5-2.5 (3) MG/3ML SOLN Take 3 mLs by nebulization every 4 (four) hours as needed. 10/13/18   Raylene Everts, MD  sulfamethoxazole-trimethoprim (BACTRIM DS) 800-160 MG tablet Take 1 tablet by mouth 2 (two) times daily. 10/21/19   Rosemarie Ax, MD  triamcinolone ointment (KENALOG) 0.1 % Apply topically. 08/13/19 08/12/20  [provider]    Family History Family History  Problem Relation Age of Onset  . Breast cancer Paternal Grandmother   . Colon cancer Paternal Grandfather   . Colon cancer Maternal Grandfather   . Colon polyps Maternal Aunt   . Diabetes Mother   . Anesthesia problems Mother        hard to wake up post-op  . Diabetes Sister   . Anesthesia problems Sister        hard to wake up post-op  . Diabetes Father   . Heart disease Father   . Eczema Neg Hx   . Allergic rhinitis Neg Hx   . Asthma Neg Hx     Social History Social History   Tobacco Use  . Smoking status: Former Smoker    Packs/day: 0.00    Years: 14.00    Pack years: 0.00    Types: Cigarettes    Quit date: 03/17/2017    Years since quitting: 2.5  . Smokeless tobacco: Never Used  Substance Use Topics  . Alcohol use: No  . Drug use: No     Allergies   Adhesive [tape], Bee venom, and Oatmeal   Review of Systems Review of Systems See HPI  Physical Exam Triage Vital Signs ED Triage Vitals  Enc Vitals Group     BP 10/21/19 1944 105/85     Pulse Rate 10/21/19 1944 (!) 102     Resp 10/21/19 1944 18     Temp 10/21/19 1944 98.2 F (36.8 C)     Temp Source 10/21/19 1944 Oral     SpO2 10/21/19 1944 100 %     Weight --      Height --      Head Circumference --      Peak Flow --      Pain Score 10/21/19 1946 5     Pain Loc --      Pain Edu? --      Excl. in  [place]? --    No data found.  Updated Vital Signs  BP 105/85 (BP Location: Right Arm)   Pulse (!) 102   Temp 98.2 F (36.8 C) (Oral)   Resp 18   LMP 08/14/2016 (Exact Date)   SpO2 100%   Visual Acuity Right Eye Distance:   Left Eye Distance:   Bilateral Distance:    Right Eye Near:   Left Eye Near:    Bilateral Near:     Physical Exam Gen: NAD, alert, cooperative with exam, well-appearing MSK:  Right hand: Small circular area on the dorsum of the PIP.  Tenderness to palpation.  Minor redness in this area with extension proximally. Limited flexion of the second digit. Neurovascular intact       UC Treatments / Results  Labs (all labs ordered are listed, but only abnormal results are displayed) Labs Reviewed - No data to display  EKG   Radiology No results found.  Procedures Procedures (including critical care time)  Medications Ordered in UC Medications - No data to display  Initial Impression / Assessment and Plan / UC Course  I have reviewed the triage vital signs and the nursing notes.  Pertinent labs & imaging results that were available during my care of the patient were reviewed by me and considered in my medical decision making (see chart for details).     Ms. Benet is a 29 year old female is presenting with finding suggestive cellulitis.  No obvious abscess on exam but possible.  Will provide with Bactrim.  Counseled on buddy taping.  Given indications to follow-up return.  Final Clinical Impressions(s) / UC Diagnoses   Final diagnoses:  Cellulitis of finger of right hand     Discharge Instructions     Please try buddy taping the fingers  Please try the antibiotic  Please follow up if your symptoms fail to improve.     ED Prescriptions    Medication Sig Dispense Auth. Provider   sulfamethoxazole-trimethoprim (BACTRIM DS) 800-160 MG tablet Take 1 tablet by mouth 2 (two) times daily. 14 tablet Rosemarie Ax, MD     PDMP not  reviewed this encounter.   Rosemarie Ax, MD 10/21/19 2125

## 2019-10-23 ENCOUNTER — Other Ambulatory Visit: Payer: Self-pay

## 2019-10-23 ENCOUNTER — Encounter: Payer: Self-pay | Admitting: Family Medicine

## 2019-10-23 ENCOUNTER — Ambulatory Visit: Payer: Medicaid Other | Admitting: Family Medicine

## 2019-10-23 ENCOUNTER — Ambulatory Visit: Payer: Self-pay

## 2019-10-23 VITALS — BP 104/69 | HR 89 | Ht 62.0 in | Wt 140.0 lb

## 2019-10-23 DIAGNOSIS — M79644 Pain in right finger(s): Secondary | ICD-10-CM | POA: Diagnosis not present

## 2019-10-23 HISTORY — DX: Pain in right finger(s): M79.644

## 2019-10-23 NOTE — Assessment & Plan Note (Signed)
Seems to be resolving abscess and cellulitis. -Continue Bactrim. -Counseled on compression. -Given indications to follow-up.

## 2019-10-23 NOTE — Patient Instructions (Signed)
Good to see you Please continue compression for one more week  Please finish the antibiotics   Please send me a message in MyChart with any questions or updates.  Please see [place] back as needed.   --Dr. Raeford Razor

## 2019-10-23 NOTE — Progress Notes (Signed)
Michelle [name] - 29 y.o. female MRN [id number]  Date of birth: 1990-11-14  SUBJECTIVE:  Including CC & ROS.  Chief Complaint  Patient presents with  . Hand Pain    right index finger x 04/06/221    Michelle [name] is a 29 y.o. female that is presenting with a sore on the dorsum of the PIP joint of the second finger.  She was seen in urgent care and started on Bactrim.  She had cut her finger a week prior to being seen in the soreness and redness to develop.  Has been applying compression as well.  Feels like the pain has gotten improvement.   Review of Systems See HPI   HISTORY: Past Medical, Surgical, Social, and Family History Reviewed & Updated per EMR.   Pertinent Historical Findings include:  Past Medical History:  Diagnosis Date  . Anxiety   . Asthma   . Bipolar disorder ([place])    no current med.  . Depression    no current med.  . Family history of adverse reaction to anesthesia    states mother and sister are hard to wake up post-op  . Gestational diabetes   . History of seizure 06/20/2012   x 1 - during delivery of child(eclampsia)  . Hypertension   . Lipoma of lower back 08/2015  . Migraine   . PTSD (post-traumatic stress disorder)   . PTSD (post-traumatic stress disorder)   . Seizures ([place])   . Tachycardia   . TMJ (dislocation of temporomandibular joint)   . Vertigo     Past Surgical History:  Procedure Laterality Date  . ABDOMINAL HYSTERECTOMY    . ADENOIDECTOMY, TONSILLECTOMY AND MYRINGOTOMY WITH TUBE PLACEMENT  1990's  . APPENDECTOMY    . CESAREAN SECTION  06/20/2012   Procedure: CESAREAN SECTION;  Surgeon: Emily Filbert, MD;  Location: [facility];  Service: Obstetrics;  Laterality: N/A;  . LAPAROSCOPIC APPENDECTOMY  07/25/2012   Procedure: APPENDECTOMY LAPAROSCOPIC;  Surgeon: Zenovia Jarred, MD;  Location: [facility];  Service: General;  Laterality: N/A;  . LAPAROSCOPIC TUBAL LIGATION Bilateral 08/04/2014   Procedure: BILATERAL LAPAROSCOPIC TUBAL LIGATION;  Surgeon:  Woodroe Mode, MD;  Location: [facility];  Service: Gynecology;  Laterality: Bilateral;  . LIPOMA EXCISION N/A 09/16/2015   Procedure: EXCISION LIPOMA LUMBAR REGION;  Surgeon: Donnie Mesa, MD;  Location: [facility];  Service: General;  Laterality: N/A;  . TONSILLECTOMY    . WISDOM TOOTH EXTRACTION      Family History  Problem Relation Age of Onset  . Breast cancer Paternal Grandmother   . Colon cancer Paternal Grandfather   . Colon cancer Maternal Grandfather   . Colon polyps Maternal Aunt   . Diabetes Mother   . Anesthesia problems Mother        hard to wake up post-op  . Diabetes Sister   . Anesthesia problems Sister        hard to wake up post-op  . Diabetes Father   . Heart disease Father   . Eczema Neg Hx   . Allergic rhinitis Neg Hx   . Asthma Neg Hx     Social History   Socioeconomic History  . Marital status: Single    Spouse name: Not on file  . Number of children: 2  . Years of education: Not on file  . Highest education level: Not on file  Occupational History  . Not on file  Tobacco Use  . Smoking status: Former  Smoker    Packs/day: 0.00    Years: 14.00    Pack years: 0.00    Types: Cigarettes    Quit date: 03/17/2017    Years since quitting: 2.6  . Smokeless tobacco: Never Used  Substance and Sexual Activity  . Alcohol use: No  . Drug use: No  . Sexual activity: Not on file  Other Topics Concern  . Not on file  Social History Narrative   Lives with boyfriend and 3 children in a one story home.  Does not work.  Education: 9th grade.   Social Determinants of Health   Financial Resource Strain:   . Difficulty of Paying Living Expenses:   Food Insecurity:   . Worried About Charity fundraiser in the Last Year:   . Arboriculturist in the Last Year:   Transportation Needs:   . Film/video editor (Medical):   Marland Kitchen Lack of Transportation (Non-Medical):   Physical Activity:   . Days of Exercise per Week:   . Minutes of Exercise per  Session:   Stress:   . Feeling of Stress :   Social Connections:   . Frequency of Communication with Friends and Family:   . Frequency of Social Gatherings with Friends and Family:   . Attends Religious Services:   . Active Member of Clubs or Organizations:   . Attends Archivist Meetings:   Marland Kitchen Marital Status:   Intimate Partner Violence:   . Fear of Current or Ex-Partner:   . Emotionally Abused:   Marland Kitchen Physically Abused:   . Sexually Abused:      PHYSICAL EXAM:  VS: BP 104/69   Pulse 89   Ht 5\' 2"  (1.575 m)   Wt 140 lb (63.5 kg)   LMP 08/14/2016 (Exact Date)   BMI 25.61 kg/m  Physical Exam Gen: NAD, alert, cooperative with exam, well-appearing MSK:  Right hand: Dorsum of the second PIP joint with a small raised area with central crusting. No obvious streaking. Normal range of motion of flexion extension of the second digit. Neurovascular intact  Limited ultrasound: Right second digit:  No changes observed of the second MCP joint. No hyperemia appreciated of the extensor tendon. No effusion appreciated in the PIP joint. Small fluid pocket within the dorsum superficial to the extensor tendon  Summary: Findings suggest a resolving abscess  Ultrasound and interpretation by Clearance Coots, MD    ASSESSMENT & PLAN:   Finger pain, right Seems to be resolving abscess and cellulitis. -Continue Bactrim. -Counseled on compression. -Given indications to follow-up.

## 2019-10-26 ENCOUNTER — Ambulatory Visit (HOSPITAL_COMMUNITY)
Admission: EM | Admit: 2019-10-26 | Discharge: 2019-10-26 | Disposition: A | Discharge status: Home / Self Care | Payer: Medicaid Other | Attending: Urgent Care | Admitting: Urgent Care

## 2019-10-26 ENCOUNTER — Other Ambulatory Visit: Payer: Self-pay

## 2019-10-26 ENCOUNTER — Encounter (HOSPITAL_COMMUNITY): Payer: Self-pay

## 2019-10-26 DIAGNOSIS — M542 Cervicalgia: Secondary | ICD-10-CM

## 2019-10-26 DIAGNOSIS — M436 Torticollis: Secondary | ICD-10-CM

## 2019-10-26 DIAGNOSIS — M549 Dorsalgia, unspecified: Secondary | ICD-10-CM

## 2019-10-26 MED ORDER — NAPROXEN 500 MG PO TABS: 500.0000 mg | ORAL_TABLET | Freq: Two times a day (BID) | ORAL | 0 refills | Status: DC

## 2019-10-26 MED ORDER — TIZANIDINE HCL 4 MG PO TABS: 4.0000 mg | ORAL_TABLET | Freq: Three times a day (TID) | ORAL | 0 refills | Status: DC | PRN

## 2019-10-26 NOTE — ED Provider Notes (Signed)
[place]   MRN: [id number] DOB: Oct 20, 1990  Subjective:   Michelle [name] is a 29 y.o. female presenting for acute onset of persistent upper back and neck pain, stiffness. Sx started randomly, no trauma or falls.  No weakness, numbness or tingling, no pain when laying down.  Patient states that the only inciting factor she can think of is from lifting her 68-year-old a few days ago.  Outside of that, patient tries to hydrate very well and does not regularly lift really heavy items.  She does not do strenuous exercise.  She is a Agricultural engineer.   No current facility-administered medications for this encounter.  Current Outpatient Medications:  .  albuterol (VENTOLIN HFA) 108 (90 Base) MCG/ACT inhaler, INHALE 2 PUFFS INTO THE LUNGS EVERY 4 HOURS AS NEEDED FOR WHEEZING OR SHORTNESS OF BREATH, Disp: 8.5 g, Rfl: 2 .  cetirizine (ZYRTEC) 10 MG tablet, Take 2 tablets (20 mg total) by mouth 2 (two) times daily., Disp: 120 tablet, Rfl: 2 .  EPINEPHrine (EPIPEN 2-PAK) 0.3 mg/0.3 mL IJ SOAJ injection, Inject 0.3 mLs (0.3 mg total) into the muscle once as needed for up to 1 dose for anaphylaxis., Disp: 2 Device, Rfl: 2 .  famotidine (PEPCID) 20 MG tablet, Take 1 tablet (20 mg total) by mouth 2 (two) times daily., Disp: 60 tablet, Rfl: 2 .  fluticasone (FLONASE) 50 MCG/ACT nasal spray, Place 1 spray into both nostrils daily., Disp: 15.8 mL, Rfl: 5 .  ibuprofen (ADVIL) 600 MG tablet, Take 1 tablet (600 mg total) by mouth every 8 (eight) hours as needed., Disp: 30 tablet, Rfl: 0 .  ipratropium-albuterol (DUONEB) 0.5-2.5 (3) MG/3ML SOLN, Take 3 mLs by nebulization every 4 (four) hours as needed., Disp: 360 mL, Rfl: 0 .  sulfamethoxazole-trimethoprim (BACTRIM DS) 800-160 MG tablet, Take 1 tablet by mouth 2 (two) times daily., Disp: 14 tablet, Rfl: 0 .  triamcinolone ointment (KENALOG) 0.1 %, Apply topically., Disp: , Rfl:    Allergies  Allergen Reactions  . Adhesive [Tape] Hives and Other (See  Comments)    EKG or heart monitor pads- caused raised, red areas (WELTS) on the skin; "sensitive" pads fell off  . Bee Venom Hives and Other (See Comments)    Welts, also  . Oatmeal Hives    Hives from the waist down and had to seek medical treatment    Past Medical History:  Diagnosis Date  . Anxiety   . Asthma   . Bipolar disorder ([place])    no current med.  . Depression    no current med.  . Family history of adverse reaction to anesthesia    states mother and sister are hard to wake up post-op  . Gestational diabetes   . History of seizure 06/20/2012   x 1 - during delivery of child(eclampsia)  . Hypertension   . Lipoma of lower back 08/2015  . Migraine   . PTSD (post-traumatic stress disorder)   . PTSD (post-traumatic stress disorder)   . Seizures ([place])   . Tachycardia   . TMJ (dislocation of temporomandibular joint)   . Vertigo      Past Surgical History:  Procedure Laterality Date  . ABDOMINAL HYSTERECTOMY    . ADENOIDECTOMY, TONSILLECTOMY AND MYRINGOTOMY WITH TUBE PLACEMENT  1990's  . APPENDECTOMY    . CESAREAN SECTION  06/20/2012   Procedure: CESAREAN SECTION;  Surgeon: Emily Filbert, MD;  Location: [facility];  Service: Obstetrics;  Laterality: N/A;  . LAPAROSCOPIC APPENDECTOMY  07/25/2012  Procedure: APPENDECTOMY LAPAROSCOPIC;  Surgeon: Zenovia Jarred, MD;  Location: [facility];  Service: General;  Laterality: N/A;  . LAPAROSCOPIC TUBAL LIGATION Bilateral 08/04/2014   Procedure: BILATERAL LAPAROSCOPIC TUBAL LIGATION;  Surgeon: Woodroe Mode, MD;  Location: [facility];  Service: Gynecology;  Laterality: Bilateral;  . LIPOMA EXCISION N/A 09/16/2015   Procedure: EXCISION LIPOMA LUMBAR REGION;  Surgeon: Donnie Mesa, MD;  Location: [facility];  Service: General;  Laterality: N/A;  . TONSILLECTOMY    . WISDOM TOOTH EXTRACTION      Family History  Problem Relation Age of Onset  . Breast cancer Paternal Grandmother   . Colon cancer Paternal Grandfather   . Colon  cancer Maternal Grandfather   . Colon polyps Maternal Aunt   . Diabetes Mother   . Anesthesia problems Mother        hard to wake up post-op  . Diabetes Sister   . Anesthesia problems Sister        hard to wake up post-op  . Diabetes Father   . Heart disease Father   . Eczema Neg Hx   . Allergic rhinitis Neg Hx   . Asthma Neg Hx     Social History   Tobacco Use  . Smoking status: Former Smoker    Packs/day: 0.00    Years: 14.00    Pack years: 0.00    Types: Cigarettes    Quit date: 03/17/2017    Years since quitting: 2.6  . Smokeless tobacco: Never Used  Substance Use Topics  . Alcohol use: No  . Drug use: No    ROS   Objective:   Vitals: BP 117/74 (BP Location: Right Arm)   Pulse 90   Temp 98.2 F (36.8 C) (Oral)   Resp 17   Wt 140 lb (63.5 kg)   LMP 08/14/2016 (Exact Date)   SpO2 100%   BMI 25.61 kg/m   Physical Exam Constitutional:      General: She is not in acute distress.    Appearance: Normal appearance. She is well-developed. She is not ill-appearing, toxic-appearing or diaphoretic.  HENT:     Head: Normocephalic and atraumatic.     Nose: Nose normal.     Mouth/Throat:     Mouth: Mucous membranes are moist.     Pharynx: Oropharynx is clear.  Eyes:     General: No scleral icterus.    Extraocular Movements: Extraocular movements intact.     Pupils: Pupils are equal, round, and reactive to light.  Cardiovascular:     Rate and Rhythm: Normal rate.  Pulmonary:     Effort: Pulmonary effort is normal.  Musculoskeletal:       Back:  Skin:    General: Skin is warm and dry.  Neurological:     General: No focal deficit present.     Mental Status: She is alert and oriented to person, place, and time.     Motor: No weakness.     Coordination: Coordination normal.     Deep Tendon Reflexes: Reflexes normal.  Psychiatric:        Mood and Affect: Mood normal.        Behavior: Behavior normal.        Thought Content: Thought content normal.         Judgment: Judgment normal.     Assessment and Plan :   1. Neck pain   2. Upper back pain   3. Neck stiffness     Use conservative  management for possible cervical strain from lifting her 19-year-old.  Start naproxen, tizanidine.  Continue to hydrate well.  Will defer imaging given lack of trauma, obvious physical exam findings supporting its need. Counseled patient on potential for adverse effects with medications prescribed/recommended today, ER and return-to-clinic precautions discussed, patient verbalized understanding.    Jaynee Eagles, PA-C 10/26/19 1348

## 2019-10-26 NOTE — ED Triage Notes (Addendum)
Pt states when she woke up on Friday morning that she was having neck/back pain. States she thought it was maybe cause she slept funny, she can look down at all. Pt has taken Advil/Tylenol to relieve discomfort. States she sneezed in the waiting room & started crying for the discomfort.

## 2019-11-09 ENCOUNTER — Ambulatory Visit (HOSPITAL_COMMUNITY)
Admission: EM | Admit: 2019-11-09 | Discharge: 2019-11-09 | Disposition: A | Discharge status: Home / Self Care | Payer: Medicaid Other

## 2019-11-09 ENCOUNTER — Encounter (HOSPITAL_COMMUNITY): Payer: Self-pay

## 2019-11-09 ENCOUNTER — Other Ambulatory Visit: Payer: Self-pay

## 2019-11-09 DIAGNOSIS — L089 Local infection of the skin and subcutaneous tissue, unspecified: Secondary | ICD-10-CM | POA: Diagnosis not present

## 2019-11-09 DIAGNOSIS — S31135A Puncture wound of abdominal wall without foreign body, periumbilic region without penetration into peritoneal cavity, initial encounter: Secondary | ICD-10-CM | POA: Diagnosis not present

## 2019-11-09 NOTE — ED Triage Notes (Signed)
Pt presents to UC with redness, discharge and discomfort in her belly button since last night. Pt states she put a belly ring 1 week ago.

## 2019-11-09 NOTE — Discharge Instructions (Signed)
There is a very mild at best infection around the area of your bellybutton ring.  I suggest keeping the bellybutton ring out until this resolves.  You may use topical cleaning with soap and water and topical Neosporin/triple antibiotic or bacitracin.  If this is not generally improving over the next week please return.

## 2019-11-09 NOTE — ED Provider Notes (Signed)
[facility]    CSN: [id number] Arrival date & time: 11/09/19  1013      History   Chief Complaint Chief Complaint  Patient presents with  . Recurrent Skin Infections    HPI Michelle [name] is a 29 y.o. female.   Patient presents for concern of infected bellybutton ring piercing.  She reports she had this pierced 11 years ago initially and kept it in for 4 years until she became pregnant.  She reports at that time she took the piercing out and has had it out since.  She reports she put jewelry back in about a week ago.  She reports she noticed today that there was a little bit of redness and crusting along the bottom pole.  She has not had pain and did not really notice anything out of sorts until today.  She reports no issues inserting the jewelry.  She is considering replacing control it with something else today.  No fevers or chills.  No significant drainage.     Past Medical History:  Diagnosis Date  . Anxiety   . Asthma   . Bipolar disorder ([place])    no current med.  . Depression    no current med.  . Family history of adverse reaction to anesthesia    states mother and sister are hard to wake up post-op  . Gestational diabetes   . History of seizure 06/20/2012   x 1 - during delivery of child(eclampsia)  . Hypertension   . Lipoma of lower back 08/2015  . Migraine   . PTSD (post-traumatic stress disorder)   . PTSD (post-traumatic stress disorder)   . Seizures ([place])   . Tachycardia   . TMJ (dislocation of temporomandibular joint)   . Vertigo     Patient Active Problem List   Diagnosis Date Noted  . Finger pain, right 10/23/2019  . Vaginal itching 01/31/2019  . Pyelonephritis 01/16/2019  . Dermatitis 01/16/2019  . Nephrolithiasis   . Uncomplicated asthma   . Eustachian tube dysfunction, bilateral 12/16/2018  . Bee sting allergy 11/06/2018  . Allergic reaction 11/04/2018  . Mild intermittent asthma without complication 99991111  . Chronic  rhinitis 11/04/2018  . Chronic urticaria 10/04/2018  . Rash 11/16/2017  . Easy bruising 11/16/2017  . Anxiety 10/27/2016  . SUI (stress urinary incontinence, female) 04/10/2016  . Migraine 02/10/2016  . PTSD 03/02/2010  . Tobacco use disorder 02/07/2008  . 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 ABDOMINAL HYSTERECTOMY    . ADENOIDECTOMY, TONSILLECTOMY AND MYRINGOTOMY WITH TUBE PLACEMENT  1990's  . APPENDECTOMY    . CESAREAN SECTION  06/20/2012   Procedure: CESAREAN SECTION;  Surgeon: Emily Filbert, MD;  Location: [facility];  Service: Obstetrics;  Laterality: N/A;  . LAPAROSCOPIC APPENDECTOMY  07/25/2012   Procedure: APPENDECTOMY LAPAROSCOPIC;  Surgeon: Zenovia Jarred, MD;  Location: [facility];  Service: General;  Laterality: N/A;  . LAPAROSCOPIC TUBAL LIGATION Bilateral 08/04/2014   Procedure: BILATERAL LAPAROSCOPIC TUBAL LIGATION;  Surgeon: Woodroe Mode, MD;  Location: [facility];  Service: Gynecology;  Laterality: Bilateral;  . LIPOMA EXCISION N/A 09/16/2015   Procedure: EXCISION LIPOMA LUMBAR REGION;  Surgeon: Donnie Mesa, MD;  Location: [facility];  Service: General;  Laterality: N/A;  . TONSILLECTOMY    . WISDOM TOOTH EXTRACTION      OB History    Gravida  3   Para  3   Term  2   Preterm  1   AB  0   Living  3     SAB  0   TAB  0   Ectopic  0   Multiple  0   Live Births  1        Obstetric Comments  C-Section Indication: Non-reassuring fetal tracing, growth delay & marked proteinuria & Pre-Eclampsia Eclamptic Seizure during C-section delivery         Home Medications    Prior to Admission medications   Medication Sig Start Date End Date Taking? Authorizing Provider  albuterol (VENTOLIN HFA) 108 (90 Base) MCG/ACT inhaler INHALE 2 PUFFS INTO THE LUNGS EVERY 4 HOURS AS NEEDED FOR WHEEZING OR SHORTNESS OF BREATH 06/27/19   Rory Percy, DO  cetirizine (ZYRTEC) 10 MG tablet  Take 2 tablets (20 mg total) by mouth 2 (two) times daily. 08/11/19   Garnet Sierras, DO  EPINEPHrine (EPIPEN 2-PAK) 0.3 mg/0.3 mL IJ SOAJ injection Inject 0.3 mLs (0.3 mg total) into the muscle once as needed for up to 1 dose for anaphylaxis. 11/04/18   Garnet Sierras, DO  famotidine (PEPCID) 20 MG tablet Take 1 tablet (20 mg total) by mouth 2 (two) times daily. 08/11/19   Garnet Sierras, DO  fluticasone (FLONASE) 50 MCG/ACT nasal spray Place 1 spray into both nostrils daily. 01/06/19   Garnet Sierras, DO  ibuprofen (ADVIL) 600 MG tablet Take 1 tablet (600 mg total) by mouth every 8 (eight) hours as needed. 01/16/19   Bonnita Hollow, MD  ipratropium-albuterol (DUONEB) 0.5-2.5 (3) MG/3ML SOLN Take 3 mLs by nebulization every 4 (four) hours as needed. 10/13/18   Raylene Everts, MD  naproxen (NAPROSYN) 500 MG tablet Take 1 tablet (500 mg total) by mouth 2 (two) times daily. 10/26/19   Jaynee Eagles, PA-C  sulfamethoxazole-trimethoprim (BACTRIM DS) 800-160 MG tablet Take 1 tablet by mouth 2 (two) times daily. 10/21/19   Rosemarie Ax, MD  tiZANidine (ZANAFLEX) 4 MG tablet Take 1 tablet (4 mg total) by mouth every 8 (eight) hours as needed for muscle spasms. 10/26/19   Jaynee Eagles, PA-C  triamcinolone ointment (KENALOG) 0.1 % Apply topically. 08/13/19 08/12/20  [provider]    Family History Family History  Problem Relation Age of Onset  . Breast cancer Paternal Grandmother   . Colon cancer Paternal Grandfather   . Colon cancer Maternal Grandfather   . Colon polyps Maternal Aunt   . Diabetes Mother   . Anesthesia problems Mother        hard to wake up post-op  . Diabetes Sister   . Anesthesia problems Sister        hard to wake up post-op  . Diabetes Father   . Heart disease Father   . Eczema Neg Hx   . Allergic rhinitis Neg Hx   . Asthma Neg Hx     Social History Social History   Tobacco Use  . Smoking status: Former Smoker    Packs/day: 0.00    Years: 14.00    Pack years: 0.00     Types: Cigarettes    Quit date: 03/17/2017    Years since quitting: 2.6  . Smokeless tobacco: Never Used  Substance Use Topics  . Alcohol use: No  . Drug use: No     Allergies   Adhesive [tape], Bee venom, and Oatmeal   Review of Systems Review of Systems   Physical Exam Triage Vital  Signs ED Triage Vitals  Enc Vitals Group     BP 11/09/19 1037 103/62     Pulse Rate 11/09/19 1037 79     Resp 11/09/19 1037 16     Temp 11/09/19 1037 98.2 F (36.8 C)     Temp Source 11/09/19 1037 Oral     SpO2 11/09/19 1037 100 %     Weight --      Height --      Head Circumference --      Peak Flow --      Pain Score 11/09/19 1035 0     Pain Loc --      Pain Edu? --      Excl. in [place]? --    No data found.  Updated Vital Signs BP 103/62 (BP Location: Right Arm)   Pulse 79   Temp 98.2 F (36.8 C) (Oral)   Resp 16   LMP 08/14/2016 (Exact Date)   SpO2 100%   Visual Acuity Right Eye Distance:   Left Eye Distance:   Bilateral Distance:    Right Eye Near:   Left Eye Near:    Bilateral Near:     Physical Exam Vitals and nursing note reviewed.  Skin:    Comments: Superior bellybutton ring in place.  Mild erythema of the superior margin of the lower hole.  Very small amount of crusting.  Nontender.  No appreciable pocket of purulence      UC Treatments / Results  Labs (all labs ordered are listed, but only abnormal results are displayed) Labs Reviewed - No data to display  EKG   Radiology No results found.  Procedures Procedures (including critical care time)  Medications Ordered in UC Medications - No data to display  Initial Impression / Assessment and Plan / UC Course  I have reviewed the triage vital signs and the nursing notes.  Pertinent labs & imaging results that were available during my care of the patient were reviewed by me and considered in my medical decision making (see chart for details).     #Infected piercing of bellybutton Patient is  29 year old with very mild infection of the bottom hole of her bellybutton piercing.  We discussed cleaning and topical antibiotics.  Discussed waiting to insert a new jewelry until resolution.  Discussed that over the past to jewelry out until complete resolution, patient reports she would prefer to keep it in.  We discussed that this would be likely okay as long as she cleans the area daily and uses the topical antibiotics.  To return if not improving or worsening over the next 1 week. Final Clinical Impressions(s) / UC Diagnoses   Final diagnoses:  Infected pierced belly button     Discharge Instructions     There is a very mild at best infection around the area of your bellybutton ring.  I suggest keeping the bellybutton ring out until this resolves.  You may use topical cleaning with soap and water and topical Neosporin/triple antibiotic or bacitracin.  If this is not generally improving over the next week please return.     ED Prescriptions    None     PDMP not reviewed this encounter.   Purnell Shoemaker, PA-C 11/09/19 1140

## 2019-11-28 ENCOUNTER — Ambulatory Visit (INDEPENDENT_AMBULATORY_CARE_PROVIDER_SITE_OTHER): Payer: Medicaid Other | Admitting: Family Medicine

## 2019-11-28 ENCOUNTER — Other Ambulatory Visit: Payer: Self-pay

## 2019-11-28 ENCOUNTER — Encounter: Payer: Self-pay | Admitting: Family Medicine

## 2019-11-28 VITALS — BP 116/64 | HR 100 | Ht 62.0 in | Wt 134.0 lb

## 2019-11-28 DIAGNOSIS — M79644 Pain in right finger(s): Secondary | ICD-10-CM | POA: Diagnosis not present

## 2019-11-28 NOTE — Patient Instructions (Signed)
It was great to see you! Your abscess has resolved.  Our plans for today:  - Do the following exercises at least once per day for the next few weeks. - If you develop swelling, redness, fever, come back to see [place].  Take care and seek immediate care sooner if you develop any concerns.   Dr. [facility]

## 2019-11-28 NOTE — Progress Notes (Signed)
   SUBJECTIVE:   CHIEF COMPLAINT / HPI: cut on finger, joint pains  Seen in UC on 4/6 for R finger abscess after cutting the dorsal side of her PIP on a cheese grater, completed 2 week course of bactrim with buddy taping. Seen in f/u 4/8 with [place] notable for resolving abscess. States she now feels pain and weakness in PIP, MCP, and wrist for the past week. Feels her grip is weakened. Denies redness, swelling, fevers.   PERTINENT  PMH / PSH: migraine, asthma, chronic urticaria, anxiety, bipolar d/o, PTSD, tobacco use  OBJECTIVE:   BP 116/64   Pulse 100   Ht 5\' 2"  (1.575 m)   Wt 134 lb (60.8 kg)   LMP 08/14/2016 (Exact Date)   SpO2 96%   BMI 24.51 kg/m   Gen: well appearing, in NAD MSK: full PROM of R 1st DIP, PIP, MCP, and wrist joints without pain. No redness or swelling noted. 5/5 strength in wrist flexion/extension, ulnar/radial deviation, finger abduction/adduction, grip strength. Preserved thenar and hypothenar eminences. Radial pulses strong b/l.  Bedside US without evidence of bony or tendinous abnormality and without increased blood flow. Abscess resolved.  ASSESSMENT/PLAN:   Finger pain, right Abscess now resolved and without objective findings of weakness. Preserved joint space on bedside US. Reassurance provided. Handout provided on hand exercises to decrease pain and increase function. F/u prn.   Rory Percy, [place]

## 2019-11-28 NOTE — Assessment & Plan Note (Signed)
Abscess now resolved and without objective findings of weakness. Preserved joint space on bedside US. Reassurance provided. Handout provided on hand exercises to decrease pain and increase function. F/u prn.

## 2019-12-03 ENCOUNTER — Other Ambulatory Visit: Payer: Self-pay | Admitting: Allergy

## 2019-12-16 DIAGNOSIS — Q615 Medullary cystic kidney: Secondary | ICD-10-CM

## 2019-12-16 HISTORY — DX: Medullary cystic kidney: Q61.5

## 2019-12-27 DIAGNOSIS — H43393 Other vitreous opacities, bilateral: Secondary | ICD-10-CM | POA: Diagnosis not present

## 2019-12-30 ENCOUNTER — Other Ambulatory Visit: Payer: Self-pay

## 2019-12-30 ENCOUNTER — Telehealth: Payer: Self-pay | Admitting: Neurology

## 2019-12-30 ENCOUNTER — Ambulatory Visit (INDEPENDENT_AMBULATORY_CARE_PROVIDER_SITE_OTHER): Payer: Medicaid Other

## 2019-12-30 DIAGNOSIS — G43009 Migraine without aura, not intractable, without status migrainosus: Secondary | ICD-10-CM | POA: Diagnosis not present

## 2019-12-30 MED ORDER — DIPHENHYDRAMINE HCL 50 MG/ML IJ SOLN
25.0000 mg | Freq: Once | INTRAMUSCULAR | Status: AC
  Administered 2019-12-30: 25 mg via INTRAVENOUS

## 2019-12-30 MED ORDER — METOCLOPRAMIDE HCL 5 MG/ML IJ SOLN
10.0000 mg | Freq: Once | INTRAVENOUS | Status: AC
  Administered 2019-12-30: 10 mg via INTRAMUSCULAR

## 2019-12-30 MED ORDER — KETOROLAC TROMETHAMINE 60 MG/2ML IM SOLN
60.0000 mg | Freq: Once | INTRAMUSCULAR | Status: AC
  Administered 2019-12-30: 60 mg via INTRAMUSCULAR

## 2019-12-30 NOTE — Telephone Encounter (Signed)
That is fine. She also has not been seen in over a year, pls have her make a f/u with me, thanks!

## 2019-12-30 NOTE — Telephone Encounter (Signed)
Spoke with pt, appt will be made for her to come in today for headache cocktail, states she should be able to get here by 3:00pm, states she understands that she must have a driver.

## 2019-12-30 NOTE — Telephone Encounter (Signed)
Please advise if OK.

## 2019-12-30 NOTE — Telephone Encounter (Signed)
Patient called in and wanted to see about coming in today for a headache cocktail. She said she would have a ride after 4, but she could try and have them get off work earlier if she can come in. Can someone follow up with her?

## 2020-01-01 ENCOUNTER — Other Ambulatory Visit: Payer: Self-pay | Admitting: Allergy

## 2020-01-13 DIAGNOSIS — H5213 Myopia, bilateral: Secondary | ICD-10-CM | POA: Diagnosis not present

## 2020-01-13 DIAGNOSIS — H1013 Acute atopic conjunctivitis, bilateral: Secondary | ICD-10-CM | POA: Diagnosis not present

## 2020-01-22 ENCOUNTER — Other Ambulatory Visit: Payer: Self-pay | Admitting: Allergy

## 2020-01-23 ENCOUNTER — Ambulatory Visit (INDEPENDENT_AMBULATORY_CARE_PROVIDER_SITE_OTHER): Payer: Medicaid Other | Admitting: Family

## 2020-01-23 ENCOUNTER — Other Ambulatory Visit: Payer: Self-pay

## 2020-01-23 ENCOUNTER — Encounter: Payer: Self-pay | Admitting: Family

## 2020-01-23 DIAGNOSIS — J310 Chronic rhinitis: Secondary | ICD-10-CM

## 2020-01-23 DIAGNOSIS — J4520 Mild intermittent asthma, uncomplicated: Secondary | ICD-10-CM

## 2020-01-23 DIAGNOSIS — Z91030 Bee allergy status: Secondary | ICD-10-CM

## 2020-01-23 DIAGNOSIS — L508 Other urticaria: Secondary | ICD-10-CM

## 2020-01-23 MED ORDER — FAMOTIDINE 20 MG PO TABS: 20.0000 mg | ORAL_TABLET | Freq: Two times a day (BID) | ORAL | 5 refills | Status: DC

## 2020-01-23 MED ORDER — EPINEPHRINE 0.3 MG/0.3ML IJ SOAJ: 0.3000 mg | Freq: Once | INTRAMUSCULAR | 1 refills | Status: DC | PRN

## 2020-01-23 NOTE — Patient Instructions (Addendum)
Chronic urticaria Continue Zyrtec 10 mg 2 tablets twice a day Continue famotidine 20 mg- 1 tablet twice a day Consider adding Singulair 10 mg once a day or Xolair injections in the future  Mild intermittent asthma May use albuterol 2 puffs every 4-6 hours as needed for coughing, wheezing, tightness in chest or shortness of breath. Asthma control goals:   Full participation in all desired activities (may need albuterol before activity)  Albuterol use two time or less a week on average (not counting use with activity)  Cough interfering with sleep two time or less a month  Oral steroids no more than once a year  No hospitalizations  Chronic rhinitis Continue Flonase nasal spray using 1 spray in each nostril 1-2 times a day as needed for stuffy nose.  Bee sting (blood work slightly positive to honeybee 0.19 kU/L Avoid Bee stings. In case of an allergic reaction, give Benadryl 4 teaspoonfuls every 4 hours, and if life-threatening symptoms occur, inject with EpiPen 0.3 mg. We will send in a new prescription for EpiPen 0.3 mg.  Please let us know if this treatment plan is not working well for you. Schedule follow up appointment in 6 months

## 2020-01-23 NOTE — Progress Notes (Signed)
RE: Michelle [name] MRN: [id number] DOB: 11-Nov-1990 Date of Telemedicine Visit: 01/23/2020  Referring provider: Matilde Haymaker, [name] Primary care provider: Matilde Haymaker, [name]  Chief Complaint: Urticaria (she does still get some hives here and there despite taking her meds. if she forgets to take them it is worse. )   Telemedicine Follow Up Visit via Telephone: I connected with Michelle [name] for a follow up on 01/23/20 by telephone and verified that I am speaking with the correct person using two identifiers.   I discussed the limitations, risks, security and privacy concerns of performing an evaluation and management service by telephone and the availability of in person appointments. I also discussed with the patient that there may be a patient responsible charge related to this service. The patient expressed understanding and agreed to proceed.  Patient is at home.  Provider is at the office.  Visit start time: 10:38 am Visit end time: 10:58 am Insurance consent/check in by: Michelle [name] consent and medical assistant/nurse: Michelle [name]  History of Present Illness: She is a 29 y.o. female, who is being followed for chronic urticaria, mild intermittent asthma, chronic rhinitis, and bee sting allergy. Her previous allergy office visit was on 08/11/2019 with Michelle [name].   Chronic urticaria is reported as moderately controlled with Zyrtec 10 mg 2 tablets twice a day and famotidine 20 mg 1 tablet twice a day. She reports that while taking this regimen she will have random itchy hives 2-3 times a week. If she forgets to take her medication for one day her hives will show up a lot worse. She reports that her hives last approximately 12 hours and do not have any bruising. She also denies any swelling. She is not interested at this time in trying Xolair due to the possible side effects. Also, discussed adding Singulair 10 mg once a day to help with itching and discussed the black box warning for possible  behavior/mood changes. She does not wish to try this at this time and reports that she will do further research. She feels that her hives are manageable with her current regimen.  Mild intermittent asthma is reported as well controlled with albuterol as needed. She reports rare shortness of breath with activity. She denies any cough, wheeze, tightness in chest or nocturnal awakenings. She has not required any systemic steroids or made any trips to the emergency room. She uses her albuterol inhaler a couple times a month.  Chronic rhinitis is reported as controlled with Flonase as needed. She reports that she has a new cat and has noticed slight nasal congestion and occasional sneezing. She denies any rhinorrhea, post nasal drip or itchy water eyes.  She has not had any bee stings since her last office visit. Her EpiPen expired June 2021.  Current medications are as listed in chart.  Assessment and Plan: Michelle [name] is a 29 y.o. female with: Patient Instructions  Chronic urticaria Continue Zyrtec 10 mg 2 tablets twice a day Continue famotidine 20 mg- 1 tablet twice a day Consider adding Singulair 10 mg once a day or Xolair injections in the future  Mild intermittent asthma May use albuterol 2 puffs every 4-6 hours as needed for coughing, wheezing, tightness in chest or shortness of breath. Asthma control goals:   Full participation in all desired activities (may need albuterol before activity)  Albuterol use two time or less a week on average (not counting use with activity)  Cough interfering with sleep two time or less a month  Oral steroids no more than once a year  No hospitalizations  Chronic rhinitis Continue Flonase nasal spray using 1 spray in each nostril 1-2 times a day as needed for stuffy nose.  Bee sting (blood work slightly positive to honeybee 0.19 kU/L Avoid Bee stings. In case of an allergic reaction, give Benadryl 4 teaspoonfuls every 4 hours, and if life-threatening  symptoms occur, inject with EpiPen 0.3 mg. We will send in a new prescription for EpiPen 0.3 mg.  Please let us know if this treatment plan is not working well for you. Schedule follow up appointment in 6 months   Return in about 6 months (around 07/25/2020), or if symptoms worsen or fail to improve.  Meds ordered this encounter  Medications  . EPINEPHrine (EPIPEN 2-PAK) 0.3 mg/0.3 mL IJ SOAJ injection    Sig: Inject 0.3 mLs (0.3 mg total) into the muscle once as needed for anaphylaxis.    Dispense:  2 each    Refill:  1    Please dispense Mylan or Teva brand generic only.  . famotidine (PEPCID) 20 MG tablet    Sig: Take 1 tablet (20 mg total) by mouth 2 (two) times daily.    Dispense:  60 tablet    Refill:  5   Lab Orders  No laboratory test(s) ordered today    Diagnostics: None.  Medication List:  Current Outpatient Medications  Medication Sig Dispense Refill  . albuterol (VENTOLIN HFA) 108 (90 Base) MCG/ACT inhaler INHALE 2 PUFFS INTO THE LUNGS EVERY 4 HOURS AS NEEDED FOR WHEEZING OR SHORTNESS OF BREATH 8.5 g 2  . cetirizine (ZYRTEC) 10 MG tablet TAKE 2 TABLETS(20 MG) BY MOUTH TWICE DAILY 120 tablet 2  . EPINEPHrine (EPIPEN 2-PAK) 0.3 mg/0.3 mL IJ SOAJ injection Inject 0.3 mLs (0.3 mg total) into the muscle once as needed for anaphylaxis. 2 each 1  . famotidine (PEPCID) 20 MG tablet Take 1 tablet (20 mg total) by mouth 2 (two) times daily. 60 tablet 5  . fluticasone (FLONASE) 50 MCG/ACT nasal spray Place 1 spray into both nostrils daily. 15.8 mL 5  . ibuprofen (ADVIL) 600 MG tablet Take 1 tablet (600 mg total) by mouth every 8 (eight) hours as needed. 30 tablet 0   No current facility-administered medications for this visit.   Allergies: Allergies  Allergen Reactions  . Adhesive [Tape] Hives and Other (See Comments)    EKG or heart monitor pads- caused raised, red areas (WELTS) on the skin; "sensitive" pads fell off  . Bee Venom Hives and Other (See Comments)    Welts,  also   I reviewed her past medical history, social history, family history, and environmental history and no significant changes have been reported from previous visit on 08/11/2019 .  Review of Systems  Constitutional: Negative for chills and fever.  HENT: Positive for congestion. Negative for postnasal drip, rhinorrhea and sinus pain.   Eyes: Negative for itching.  Respiratory: Negative for cough, shortness of breath and wheezing.   Cardiovascular: Negative for chest pain and palpitations.  Genitourinary: Negative for dysuria.  Neurological: Negative for dizziness.  Hematological: Does not bruise/bleed easily.   Objective: Physical Exam Not obtained as encounter was done via telephone.   Previous notes and tests were reviewed.  I discussed the assessment and treatment plan with the patient. The patient was provided an opportunity to ask questions and all were answered. The patient agreed with the plan and demonstrated an understanding of the instructions.   The patient was  advised to call back or seek an in-person evaluation if the symptoms worsen or if the condition fails to improve as anticipated.  I provided 20 minutes of non-face-to-face time during this encounter.  It was my pleasure to participate in [place] care today. Please feel free to contact me with any questions or concerns.   Sincerely,  Althea Charon, FNP

## 2020-02-03 ENCOUNTER — Other Ambulatory Visit: Payer: Self-pay

## 2020-02-03 ENCOUNTER — Encounter: Payer: Self-pay | Admitting: Family Medicine

## 2020-02-03 ENCOUNTER — Ambulatory Visit (INDEPENDENT_AMBULATORY_CARE_PROVIDER_SITE_OTHER): Payer: Medicaid Other | Admitting: Family Medicine

## 2020-02-03 DIAGNOSIS — M25551 Pain in right hip: Secondary | ICD-10-CM | POA: Diagnosis present

## 2020-02-03 MED ORDER — NAPROXEN 500 MG PO TABS: 500.0000 mg | ORAL_TABLET | Freq: Two times a day (BID) | ORAL | 0 refills | Status: DC

## 2020-02-03 NOTE — Progress Notes (Signed)
    SUBJECTIVE:   CHIEF COMPLAINT / HPI:   Breast tenderness Michelle [name] noticed earlier in the week that she was having breast tenderness in her right breast.  This seemed to persist for about 3 days.  She is scheduled an appointment to be seen on day 2 of this issue.  Since then, the tenderness has resolved and she has no concerns about her breasts.  She does have a family history of breast cancer in her maternal and paternal grandmothers with the youngest presentation being around 31 years old.  Right hip pain About 3 weeks ago, she had a strenuous day of walking around and noticed some moderate right hip discomfort afterward.  She has had a mild aching on the outside of her right hip since then.  She expected it to go away with time but it has been persistent of the low level.  She notices the discomfort most when she tries to lie on her right side at night or occasionally when she sits down on the couch and bumped her right hip.  She noted that she also got a treadmill and has been using it more this past week.  She walks on the treadmill for half an hour at a leisurely pace.  At home, she is not currently taking any medications help with the pain seems to be relatively minor.  PERTINENT  PMH / PSH: Noncontributory  OBJECTIVE:   BP 110/60   Pulse 91   Ht 5\' 2"  (1.575 m)   Wt 144 lb (65.3 kg)   LMP 08/14/2016 (Exact Date)   SpO2 97%   BMI 26.34 kg/m    General: Well-appearing young woman in no acute distress. Respiratory: Breathing comfortably on room air.  No respiratory distress.  Right hip Inspection: No gross abnormality/deformity. Palpation: Mild tenderness to palpation of the right trochanter.  No tenderness with palpation of the left trochanter. ROM normal Neurovascularly intact Special testing: Straight leg raise negative, FABER and FADIR testing negative.  ASSESSMENT/PLAN:   Right hip pain Most likely trochanteric bursitis versus tight IT band.  No history of trauma.   No suspicion for fracture.  Very low suspicion for arthritis.  She was advised to treat the pain with naproxen 500 mg twice daily and reduce her exercise to only light exercise for the next 1-2 weeks.  She was encouraged to return to clinic if she does not have significant improvement in her symptoms in that time.   Breast tenderness Spontaneously resolved on its own.  She was offered a breast exam today in clinic and declined.  She was encouraged to return to clinic if this issue returned or she had any new concerns.  She was advised to start breast cancer screening of 38.  Michelle Haymaker, MD [place]

## 2020-02-03 NOTE — Assessment & Plan Note (Signed)
Most likely trochanteric bursitis versus tight IT band.  No history of trauma.  No suspicion for fracture.  Very low suspicion for arthritis.  She was advised to treat the pain with naproxen 500 mg twice daily and reduce her exercise to only light exercise for the next 1-2 weeks.  She was encouraged to return to clinic if she does not have significant improvement in her symptoms in that time.

## 2020-02-03 NOTE — Patient Instructions (Signed)
Breast pain: I am glad this seems to have improved before coming into the office.  If it does come back or if you concerned about any breast nodules, please let us know.  I recommend starting breast cancer screening at 30 unless you learn that he has a family history of breast cancer starting at an earlier age.  Hip pain: I think this is most likely a trochanteric bursitis.  For now, I have given you a prescription for a stronger NSAID.  I recommend only doing light exercise for the next 2 weeks or so.  If this discomfort persists for another 2-3 weeks, come back and we will take a closer look.

## 2020-02-05 DIAGNOSIS — L720 Epidermal cyst: Secondary | ICD-10-CM | POA: Diagnosis not present

## 2020-02-05 DIAGNOSIS — L700 Acne vulgaris: Secondary | ICD-10-CM | POA: Diagnosis not present

## 2020-02-17 ENCOUNTER — Ambulatory Visit: Payer: Medicaid Other | Admitting: Family Medicine

## 2020-03-05 ENCOUNTER — Encounter: Payer: Self-pay | Admitting: Family Medicine

## 2020-03-05 ENCOUNTER — Ambulatory Visit (INDEPENDENT_AMBULATORY_CARE_PROVIDER_SITE_OTHER): Payer: Medicaid Other | Admitting: Family Medicine

## 2020-03-05 ENCOUNTER — Other Ambulatory Visit: Payer: Self-pay

## 2020-03-05 DIAGNOSIS — M25551 Pain in right hip: Secondary | ICD-10-CM

## 2020-03-05 MED ORDER — DICLOFENAC SODIUM 1 % EX GEL: 2.0000 g | Freq: Four times a day (QID) | CUTANEOUS | 2 refills | Status: DC

## 2020-03-05 MED ORDER — MELOXICAM 15 MG PO TABS: 15.0000 mg | ORAL_TABLET | Freq: Every day | ORAL | 1 refills | Status: DC

## 2020-03-05 NOTE — Patient Instructions (Signed)
IT band inflammation: I still think this is the most likely cause of your hip discomfort right now.  Lets continue using anti-inflammatories and stretching.  Please come back to clinic if this does not improve in the next 3-4 weeks.  I have called in a prescription for Mobic.  This is a once daily medication that you can take and will have few GI side effects.  Please also try the topical Voltaren gel.  1. Side-lying leg raises This exercise targets your core, glutes, and hip abductors, which helps improve stability. For more support, bend your bottom leg. For a challenge, use a resistance band around your ankles.  How to do it:  Lie on your right side with your left hip directly over your right. Keep your body in a straight line, pressing your left hand into the floor for support. Use your right arm or a pillow to support your head. Position your foot so your heel is slightly higher than your toes. Slowly raise your left leg. Pause here for 2 to 5 seconds. Slowly return to the starting position. Do 2 to 3 sets of 15 to 20 repetitions on each side.  2. Forward fold with crossed legs  The forward fold stretch helps relieve tension and tightness along your IT band. You'll feel a stretch along the muscles on the side of your thigh as you do it. To stretch more deeply, place all of your weight onto your back foot.  Use a block or prop under your hands if they don't reach the floor, or if you have any low back pain. If you have concerns with blood coming to your head, keep your back flat and your head raised.  How to do it:  Stand with your feet hip-distance apart. Cross your left foot over your right, aligning your pinkie toes as much as possible. Inhale and extend your arms overhead. Exhale as you hinge forward from your hips, and lengthen your spine to come into a forward bend. Reach your hands toward the floor, and elongate the back of your neck. Keep your knees slightly bent. Hold this  position for up to 1 minute, then do the opposite side.  3. Cow face pose This yoga pose relieves deep tightness in your glutes, hips, and thighs, improving flexibility and mobility. It also stretches your knees and ankles.  Avoid sinking over to one side. Use a cushion to evenly ground both sitting bones into the floor so your hips are even. To make this pose easier, extend your bottom leg out straight.  How to do it:  Bend your left knee and position it at the center of your body. Draw in your left foot toward your hip. Cross your right knee over the left, stacking your knees. Place your right heel and ankle to the outside of your left hip. Hold this position for up to 1 minute. To go deeper, walk your hands forward to fold into a forward bend. Hold this position for up to 1 minute, then do the opposite side.  4. Seated spinal twist  This stretch relieves tightness in your spine, hips, and outer thighs. It opens your shoulders and chest, allowing for improved posture and stability.  For a more gentle stretch, extend your lower leg out straight. Place a cushion under this knee if your hamstrings are especially tight.  How to do it:  From a seated position on the floor, bend your left leg and place your left foot on the outside  of your right hip. Bend your right leg and place your right foot flat on the floor on the outside of your left thigh. Exhale as you twist your lower body to the right. Place your left fingertips on the floor, bending your hips. Wrap your elbow around your knee, or place your elbow to the outside of your knee with your palm facing forward. Gaze over your back shoulder. Hold this position for up to 1 minute, then do the opposite side.  5. Foam roller stretch  This exercise requires you to have a foam roller. Use it to roll out tension, muscle knots, and tightness around your IT band.  Focus on any areas where you're experiencing tightness or irritation. Go  slowly over these areas.  How to do it:  Lie on your right side with your upper thigh resting on the foam roller. Keep your right leg straight and press the sole of your left foot into the floor for support. Place both hands on the floor for stability, or prop yourself up on your right side. Foam roll down to your knee before rolling back up to your hip. Continue for up to 5 minutes, then do the opposite side.

## 2020-03-05 NOTE — Progress Notes (Signed)
    SUBJECTIVE:   CHIEF COMPLAINT / HPI:   IT band syndrome Michelle [name] reports she continues to have left hip pain since her last visit.  This started about 6 weeks ago.  She was last seen by me on 7/20 at which time she was told she had either trochanteric bursitis or IT band syndrome.  She was given naproxen and told to reduce her exercise.  Since that visit, she has reduced her exercise but not taking naproxen.  She reports taking ibuprofen about 400 mg daily.  She did not take the naproxen due to concern it might cause stomach irritation.  Her left hip pain does not seem to have improved and may have worsened since her last visit.  She notices this most at night when she is trying to lie on her side and with walking.  The discomfort seems to be on the outside of her hip as opposed internally.  PERTINENT  PMH / PSH: No significant medical history  OBJECTIVE:   BP (!) 106/52   Pulse 86   Ht 5\' 2"  (1.575 m)   Wt 139 lb 2 oz (63.1 kg)   LMP 08/14/2016 (Exact Date)   SpO2 99%   BMI 25.45 kg/m    General: Resting comfortably in the exam table. Right hip Inspection: Normal appearing, symmetrical, grossly normal compared to left hip. Palpation: No significant tenderness to palpation of the greater trochanter.  Some tenderness in the muscle body of the gluteus medius and gluteus minimus. Neurovascularly intact Appropriate range of motion No significant discomfort with FABER or FADIR testing.  Some tightness noted with straightening of the leg and hyperadduction .   ASSESSMENT/PLAN:   Right hip pain MSK discomfort.  Most likely IT band syndrome. -Mobic provided to avoid GI symptoms -Voltaren gel prescribed -IT band exercises provided -Return to clinic if no improvement in 3-4 weeks     Michelle Haymaker, MD [place]

## 2020-03-05 NOTE — Assessment & Plan Note (Signed)
MSK discomfort.  Most likely IT band syndrome. -Mobic provided to avoid GI symptoms -Voltaren gel prescribed -IT band exercises provided -Return to clinic if no improvement in 3-4 weeks

## 2020-04-05 ENCOUNTER — Encounter (HOSPITAL_COMMUNITY): Payer: Self-pay | Admitting: Emergency Medicine

## 2020-04-05 ENCOUNTER — Other Ambulatory Visit: Payer: Self-pay

## 2020-04-05 ENCOUNTER — Ambulatory Visit (HOSPITAL_COMMUNITY)
Admission: EM | Admit: 2020-04-05 | Discharge: 2020-04-05 | Disposition: A | Discharge status: Home / Self Care | Payer: Medicaid Other | Attending: Emergency Medicine | Admitting: Emergency Medicine

## 2020-04-05 DIAGNOSIS — S0990XA Unspecified injury of head, initial encounter: Secondary | ICD-10-CM | POA: Diagnosis not present

## 2020-04-05 MED ORDER — ONDANSETRON 4 MG PO TBDP: 4.0000 mg | ORAL_TABLET | Freq: Three times a day (TID) | ORAL | 0 refills | Status: AC | PRN

## 2020-04-05 NOTE — ED Provider Notes (Signed)
Emergency Department Provider Note  ____________________________________________  Time seen: Approximately 9:25 AM  I have reviewed the triage vital signs and the nursing notes.   HISTORY  Chief Complaint Head Injury   Historian Patient     HPI Michelle [name] is a 29 y.o. female presents to the emergency department after an empty glass bottle fell from a shelf onto her head.  Patient did not lose consciousness.  She denies neck pain.  No changes in grip strength or numbness or tingling in the upper extremities.  Patient denies abrasions or lacerations.  She states that she has had some nausea but no vomiting.  No disorientation or confusion.  No other alleviating measures have been attempted.   Past Medical History:  Diagnosis Date  . Anxiety   . Asthma   . Bipolar disorder ([place])    no current med.  . Depression    no current med.  . Dermatitis 01/16/2019  . Family history of adverse reaction to anesthesia    states mother and sister are hard to wake up post-op  . Gestational diabetes   . History of seizure 06/20/2012   x 1 - during delivery of child(eclampsia)  . Hypertension   . Lipoma of lower back 08/2015  . Migraine   . Nephrolithiasis   . PTSD (post-traumatic stress disorder)   . PTSD (post-traumatic stress disorder)   . Pyelonephritis 01/16/2019  . Seizures ([place])   . Tachycardia   . TMJ (dislocation of temporomandibular joint)   . Vertigo      Immunizations up to date:  Yes.     Past Medical History:  Diagnosis Date  . Anxiety   . Asthma   . Bipolar disorder ([place])    no current med.  . Depression    no current med.  . Dermatitis 01/16/2019  . Family history of adverse reaction to anesthesia    states mother and sister are hard to wake up post-op  . Gestational diabetes   . History of seizure 06/20/2012   x 1 - during delivery of child(eclampsia)  . Hypertension   . Lipoma of lower back 08/2015  . Migraine   . Nephrolithiasis   . PTSD  (post-traumatic stress disorder)   . PTSD (post-traumatic stress disorder)   . Pyelonephritis 01/16/2019  . Seizures ([place])   . Tachycardia   . TMJ (dislocation of temporomandibular joint)   . Vertigo     Patient Active Problem List   Diagnosis Date Noted  . Right hip pain 02/03/2020  . Finger pain, right 10/23/2019  . Eustachian tube dysfunction, bilateral 12/16/2018  . Bee sting allergy 11/06/2018  . Allergic reaction 11/04/2018  . Mild intermittent asthma without complication 13/02/6577  . Chronic rhinitis 11/04/2018  . Chronic urticaria 10/04/2018  . Rash 11/16/2017  . Easy bruising 11/16/2017  . Anxiety 10/27/2016  . SUI (stress urinary incontinence, female) 04/10/2016  . Migraine 02/10/2016  . PTSD 03/02/2010  . Tobacco use disorder 02/07/2008  . 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 ABDOMINAL HYSTERECTOMY    . ADENOIDECTOMY, TONSILLECTOMY AND MYRINGOTOMY WITH TUBE PLACEMENT  1990's  . APPENDECTOMY    . CESAREAN SECTION  06/20/2012   Procedure: CESAREAN SECTION;  Surgeon: Emily Filbert, MD;  Location: [facility];  Service: Obstetrics;  Laterality: N/A;  . LAPAROSCOPIC APPENDECTOMY  07/25/2012   Procedure: APPENDECTOMY LAPAROSCOPIC;  Surgeon: Zenovia Jarred, MD;  Location: [facility];  Service: General;  Laterality: N/A;  . LAPAROSCOPIC TUBAL LIGATION Bilateral 08/04/2014   Procedure: BILATERAL LAPAROSCOPIC TUBAL LIGATION;  Surgeon: Woodroe Mode, MD;  Location: [facility];  Service: Gynecology;  Laterality: Bilateral;  . LIPOMA EXCISION N/A 09/16/2015   Procedure: EXCISION LIPOMA LUMBAR REGION;  Surgeon: Donnie Mesa, MD;  Location: [facility];  Service: General;  Laterality: N/A;  . TONSILLECTOMY    . WISDOM TOOTH EXTRACTION      Prior to Admission medications   Medication Sig Start Date End Date Taking? Authorizing Provider  cetirizine (ZYRTEC) 10 MG tablet TAKE 2 TABLETS(20 MG) BY MOUTH TWICE  DAILY 01/02/20  Yes Garnet Sierras, DO  famotidine (PEPCID) 20 MG tablet Take 1 tablet (20 mg total) by mouth 2 (two) times daily. 01/23/20  Yes Althea Charon, FNP  albuterol (VENTOLIN HFA) 108 (90 Base) MCG/ACT inhaler INHALE 2 PUFFS INTO THE LUNGS EVERY 4 HOURS AS NEEDED FOR WHEEZING OR SHORTNESS OF BREATH 06/27/19   Myles Gip, DO  diclofenac Sodium (VOLTAREN) 1 % GEL Apply 2 g topically 4 (four) times daily. 03/05/20   Matilde Haymaker, MD  EPINEPHrine (EPIPEN 2-PAK) 0.3 mg/0.3 mL IJ SOAJ injection Inject 0.3 mLs (0.3 mg total) into the muscle once as needed for anaphylaxis. 01/23/20   Althea Charon, FNP  fluticasone (FLONASE) 50 MCG/ACT nasal spray Place 1 spray into both nostrils daily. 01/06/19   Garnet Sierras, DO  ibuprofen (ADVIL) 600 MG tablet Take 1 tablet (600 mg total) by mouth every 8 (eight) hours as needed. 01/16/19   Bonnita Hollow, MD  meloxicam (MOBIC) 15 MG tablet Take 1 tablet (15 mg total) by mouth daily. 03/05/20   Matilde Haymaker, MD  naproxen (NAPROSYN) 500 MG tablet Take 1 tablet (500 mg total) by mouth 2 (two) times daily with a meal. 02/03/20   Matilde Haymaker, MD  ondansetron (ZOFRAN ODT) 4 MG disintegrating tablet Take 1 tablet (4 mg total) by mouth every 8 (eight) hours as needed for up to 5 days for nausea or vomiting. 04/05/20 04/10/20  Lannie Fields, PA-C    Allergies Adhesive [tape] and Bee venom  Family History  Problem Relation Age of Onset  . Breast cancer Paternal Grandmother   . Colon cancer Paternal Grandfather   . Colon cancer Maternal Grandfather   . Colon polyps Maternal Aunt   . Diabetes Mother   . Anesthesia problems Mother        hard to wake up post-op  . Diabetes Sister   . Anesthesia problems Sister        hard to wake up post-op  . Diabetes Father   . Heart disease Father   . Eczema Neg Hx   . Allergic rhinitis Neg Hx   . Asthma Neg Hx     Social History Social History   Tobacco Use  . Smoking status: Former Smoker    Packs/day: 0.00     Years: 14.00    Pack years: 0.00    Types: Cigarettes    Quit date: 03/17/2017    Years since quitting: 3.0  . Smokeless tobacco: Never Used  Vaping Use  . Vaping Use: Some days  Substance Use Topics  . Alcohol use: No  . Drug use: No     Review of Systems  Constitutional: No fever/chills Eyes:  No discharge ENT: No upper respiratory complaints. Respiratory: no cough. No SOB/ use of accessory muscles to breath Gastrointestinal:   No nausea, no vomiting.  No  diarrhea.  No constipation. Musculoskeletal: Negative for musculoskeletal pain. Neuro: Patient has headache.  Skin: Negative for rash, abrasions, lacerations, ecchymosis.    ____________________________________________   PHYSICAL EXAM:  VITAL SIGNS: ED Triage Vitals  Enc Vitals Group     BP 04/05/20 0848 107/62     Pulse Rate 04/05/20 0848 75     Resp 04/05/20 0848 16     Temp 04/05/20 0848 98.3 F (36.8 C)     Temp Source 04/05/20 0848 Oral     SpO2 04/05/20 0848 100 %     Weight --      Height --      Head Circumference --      Peak Flow --      Pain Score 04/05/20 0846 5     Pain Loc --      Pain Edu? --      Excl. in [place]? --      Constitutional: Alert and oriented. Well appearing and in no acute distress. Eyes: Conjunctivae are normal. PERRL. EOMI. Head: Atraumatic. ENT:      Nose: No congestion/rhinnorhea.      Mouth/Throat: Mucous membranes are moist.  Neck: No stridor.  Full range of motion.  No midline C-spine tenderness to palpation. Cardiovascular: Normal rate, regular rhythm. Normal S1 and S2.  Good peripheral circulation. Respiratory: Normal respiratory effort without tachypnea or retractions. Lungs CTAB. Good air entry to the bases with no decreased or absent breath sounds Gastrointestinal: Bowel sounds x 4 quadrants. Soft and nontender to palpation. No guarding or rigidity. No distention. Musculoskeletal: Patient has symmetric grip strength in the upper extremities.  Full range of motion to  all extremities. No obvious deformities noted Neurologic:  Normal for age. No gross focal neurologic deficits are appreciated.  Patient can perform hand to nose and rapid alternating movements.  Performs heel-to-toe easily. Skin:  Skin is warm, dry and intact. No rash noted. Psychiatric: Mood and affect are normal for age. Speech and behavior are normal.   ____________________________________________   LABS (all labs ordered are listed, but only abnormal results are displayed)  Labs Reviewed - No data to display ____________________________________________  EKG   ____________________________________________  RADIOLOGY   No results found.  ____________________________________________    PROCEDURES  Procedure(s) performed:     Procedures     Medications - No data to display   ____________________________________________   INITIAL IMPRESSION / ASSESSMENT AND PLAN / ED COURSE  Pertinent labs & imaging results that were available during my care of the patient were reviewed by me and considered in my medical decision making (see chart for details).      Assessment and plan Head contusion 29 year old female presents to the emergency department after a glass bottle fell from a shelf and struck her head.  Vital signs were reassuring at triage.  On physical exam, patient was alert and oriented.  She had no deficits on neuro exam.  There were no abrasions or lacerations.  Suspect mild concussion as patient has headache and mild nausea.  Recommended Tylenol at home for headache and prescribed a short course of Zofran for nausea.  Cautioned patient that she should be evaluated in the emergency department if her headache worsens or if she experiences multiple episodes of vomiting.  She voiced understanding and has easy access to the emergency department should her symptoms change.  All patient questions were  answered.    ____________________________________________  FINAL CLINICAL IMPRESSION(S) / ED DIAGNOSES  Final diagnoses:  Injury of head,  initial encounter      NEW MEDICATIONS STARTED DURING THIS VISIT:  ED Discharge Orders         Ordered    ondansetron (ZOFRAN ODT) 4 MG disintegrating tablet  Every 8 hours PRN        04/05/20 0922              This chart was dictated using voice recognition software/Dragon. Despite best efforts to proofread, errors can occur which can change the meaning. Any change was purely unintentional.     Lannie Fields, [place] 04/05/20 [phone]

## 2020-04-05 NOTE — ED Triage Notes (Signed)
PT reports a thick glass bottle fell off of a storage shelf and struck her in the head this AM. Bottle did not break, no laceration. Intermittent nausea since incident. No change in LOC.

## 2020-04-05 NOTE — Discharge Instructions (Signed)
If headache worsens or you experience multiple episodes of emesis at home, please seek care at local emergency department for CT scan of the head. Please take Tylenol for headache. You have been prescribed Zofran which can be taken every 8 hours as needed for nausea. You can engage in light activity at home.  Please do not engage in contact sports or other vigorous physical activity for the next 2 to 3 days.

## 2020-04-09 ENCOUNTER — Encounter: Payer: Self-pay | Admitting: Neurology

## 2020-04-09 ENCOUNTER — Ambulatory Visit (INDEPENDENT_AMBULATORY_CARE_PROVIDER_SITE_OTHER): Payer: Medicaid Other | Admitting: Neurology

## 2020-04-09 ENCOUNTER — Other Ambulatory Visit: Payer: Self-pay

## 2020-04-09 VITALS — BP 123/78 | HR 102 | Ht 62.0 in | Wt 147.4 lb

## 2020-04-09 DIAGNOSIS — G43009 Migraine without aura, not intractable, without status migrainosus: Secondary | ICD-10-CM | POA: Diagnosis not present

## 2020-04-09 DIAGNOSIS — S060X0A Concussion without loss of consciousness, initial encounter: Secondary | ICD-10-CM

## 2020-04-09 MED ORDER — NORTRIPTYLINE HCL 25 MG PO CAPS: 25.0000 mg | ORAL_CAPSULE | Freq: Every day | ORAL | 6 refills | Status: DC

## 2020-04-09 NOTE — Progress Notes (Signed)
Head injured on Monday  Went to urgent care

## 2020-04-09 NOTE — Progress Notes (Signed)
NEUROLOGY FOLLOW UP OFFICE NOTE  Michelle [name] [id number] 1991/03/15  HISTORY OF PRESENT ILLNESS: I had the pleasure of seeing Michelle [name] in follow-up in the neurology clinic on 04/09/2020.  The patient was last seen 10 months ago for migraines. On her last visit, she reported doing well with the migraines off nortriptyline. She reports migraines have overall been manageable, she has had a couple of Urgent Care visits for migraine cocktail, once in our office last June. She had a mild concussion last 04/05/20, an empty decoration bottle fell on top of her head, no loss of consciousness. She became nauseated with on and off daily headaches and went to Urgent Care, currently taking daily Tylenol and Ibuprofen round the clock. There is tenderness on her frontal region. No vision changes, dizziness, focal numbness/tingling/weakness. Sleep has been affected due to pain. When she looks down, her neck is aching.    History on Initial Assessment 03/06/2016: This is a 29yo RH woman with a history of bipolar disorder, PTSD, hypertension, who presented with new onset headaches since June 2016. 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 They were so bad that she was shaking on the floor and felt like she would pass out. She went to the ER on 01/06/16 and reports having daily headaches that wax and wane in intensity since then. She would wake up with a headache then towards the evening they become more severe 10/10 with throbbing pain and nausea. She occasionally sees black dots in her vision. No positional component. She stopped Depo-Provera last week. No photo/phonophobia, tinnitus, focal numbness/tingling/weakness, dizziness, diplopia, dysarthria, dysphagia, bowel/bladder dysfunction. She has had neck pains as well. She has chronic back pain.   She has  been dealing with significant stomach cramps and will be seeing a specialist regarding concern for endometriosis. She has been taking Tramadol and Percocet daily for the cramps, and has prn Fioricet for the headaches, taking 2-3 daily for the past 2 months. She gets 8-9 hours of sleep but still feels exhausted (for the past couple of years). She denies snoring, she has daytime drowsiness. She was given Reglan as well in the ER and takes this daily. There is a history of one seizure during delivery of her third child in 2013. No family history of migraines.    PAST MEDICAL HISTORY: Past Medical History:  Diagnosis Date  . Anxiety   . Asthma   . Bipolar disorder ([place])    no current med.  . Depression    no current med.  . Dermatitis 01/16/2019  . Family history of adverse reaction to anesthesia    states mother and sister are hard to wake up post-op  . Gestational diabetes   . History of seizure 06/20/2012   x 1 - during delivery of child(eclampsia)  . Hypertension   . Lipoma of lower back 08/2015  . Migraine   . Nephrolithiasis   . PTSD (post-traumatic stress disorder)   . PTSD (post-traumatic stress disorder)   . Pyelonephritis 01/16/2019  . Seizures ([place])   . Tachycardia   . TMJ (dislocation of temporomandibular joint)   . Vertigo     MEDICATIONS: Current Outpatient Medications on File Prior to Visit  Medication Sig Dispense Refill  . acetaminophen (TYLENOL) 325 MG tablet Take 650 mg by mouth every 6 (six) hours as needed.    Marland Kitchen  albuterol (VENTOLIN HFA) 108 (90 Base) MCG/ACT inhaler INHALE 2 PUFFS INTO THE LUNGS EVERY 4 HOURS AS NEEDED FOR WHEEZING OR SHORTNESS OF BREATH 8.5 g 2  . cetirizine (ZYRTEC) 10 MG tablet TAKE 2 TABLETS(20 MG) BY MOUTH TWICE DAILY 120 tablet 2  . EPINEPHrine (EPIPEN 2-PAK) 0.3 mg/0.3 mL IJ SOAJ injection Inject 0.3 mLs (0.3 mg total) into the muscle once as needed for anaphylaxis. 2 each 1  . famotidine (PEPCID) 20 MG tablet Take 1 tablet (20 mg total) by  mouth 2 (two) times daily. 60 tablet 5  . ibuprofen (ADVIL) 600 MG tablet Take 1 tablet (600 mg total) by mouth every 8 (eight) hours as needed. 30 tablet 0  . ondansetron (ZOFRAN ODT) 4 MG disintegrating tablet Take 1 tablet (4 mg total) by mouth every 8 (eight) hours as needed for up to 5 days for nausea or vomiting. 20 tablet 0  . fluticasone (FLONASE) 50 MCG/ACT nasal spray Place 1 spray into both nostrils daily. (Patient not taking: Reported on 04/09/2020) 15.8 mL 5   No current facility-administered medications on file prior to visit.    ALLERGIES: Allergies  Allergen Reactions  . Adhesive [Tape] Hives and Other (See Comments)    EKG or heart monitor pads- caused raised, red areas (WELTS) on the skin; "sensitive" pads fell off  . Bee Venom Hives and Other (See Comments)    Welts, also    FAMILY HISTORY: Family History  Problem Relation Age of Onset  . Breast cancer Paternal Grandmother   . Colon cancer Paternal Grandfather   . Colon cancer Maternal Grandfather   . Colon polyps Maternal Aunt   . Diabetes Mother   . Anesthesia problems Mother        hard to wake up post-op  . Diabetes Sister   . Anesthesia problems Sister        hard to wake up post-op  . Diabetes Father   . Heart disease Father   . Eczema Neg Hx   . Allergic rhinitis Neg Hx   . Asthma Neg Hx     SOCIAL HISTORY: Social History   Socioeconomic History  . Marital status: Single    Spouse name: Not on file  . Number of children: 2  . Years of education: Not on file  . Highest education level: Not on file  Occupational History  . Not on file  Tobacco Use  . Smoking status: Former Smoker    Packs/day: 0.00    Years: 14.00    Pack years: 0.00    Types: Cigarettes    Quit date: 03/17/2017    Years since quitting: 3.0  . Smokeless tobacco: Never Used  Vaping Use  . Vaping Use: Some days  Substance and Sexual Activity  . Alcohol use: No  . Drug use: No  . Sexual activity: Yes    Birth  control/protection: Surgical  Other Topics Concern  . Not on file  Social History Narrative   Lives with boyfriend and 3 children in a one story home.  Does not work.  Education: 9th grade. Right handed    Social Determinants of Health   Financial Resource Strain:   . Difficulty of Paying Living Expenses: Not on file  Food Insecurity:   . Worried About Charity fundraiser in the Last Year: Not on file  . Ran Out of Food in the Last Year: Not on file  Transportation Needs:   . Lack of Transportation (Medical): Not on file  .  Lack of Transportation (Non-Medical): Not on file  Physical Activity:   . Days of Exercise per Week: Not on file  . Minutes of Exercise per Session: Not on file  Stress:   . Feeling of Stress : Not on file  Social Connections:   . Frequency of Communication with Friends and Family: Not on file  . Frequency of Social Gatherings with Friends and Family: Not on file  . Attends Religious Services: Not on file  . Active Member of Clubs or Organizations: Not on file  . Attends Archivist Meetings: Not on file  . Marital Status: Not on file  Intimate Partner Violence:   . Fear of Current or Ex-Partner: Not on file  . Emotionally Abused: Not on file  . Physically Abused: Not on file  . Sexually Abused: Not on file     PHYSICAL EXAM: Vitals:   04/09/20 1450  BP: 123/78  Pulse: (!) 102  SpO2: 99%   General: No acute distress Head:  Normocephalic/atraumatic Skin/Extremities: No rash, no edema Neurological Exam: alert and oriented to person, place, and time. No aphasia or dysarthria. Fund of knowledge is appropriate.  Recent and remote memory are intact.  Attention and concentration are normal.   Cranial nerves: Pupils equal, round. Extraocular movements intact with no nystagmus. Visual fields full.  No facial asymmetry.  Motor: Bulk and tone normal, muscle strength 5/5 throughout with no pronator drift.   Finger to nose testing intact.  Gait  narrow-based and steady, able to tandem walk adequately.  Romberg negative.   IMPRESSION: This is a 29 yo RH woman with a history of bipolar disorder, PTSD, hypertension, who presented in 2017 for new onset daily headaches with migranous features. MRI brain normal. She had a good response to nortriptyline and was able to wean off with no significant headache recurrence. She had a mild concussion 4 days ago from head injury, nausea improved but headaches continue. She will continue on OTC anti-inflammatory medication for another few days, but if no improvement, she agrees to restart nortriptyline 25mg  qhs for headache prophylaxis and minimize Tylenol/Ibuprofen intake to 2-3 times a week to avoid rebound headaches. Follow-up in 6 months, call for any changes.   Thank you for allowing me to participate in her care.  Please do not hesitate to call for any questions or concerns.   Ellouise Newer, M.D.   CC: Dr. Pilar Plate

## 2020-04-09 NOTE — Patient Instructions (Signed)
1. If no improvement by Tues, restart nortriptyline 25mg  every night and minimize over the counter pain medication to 2-3 times a week to avoid rebound headaches  2. Follow-up in 6 months, call for any changes

## 2020-04-10 ENCOUNTER — Telehealth: Payer: Self-pay | Admitting: Family Medicine

## 2020-04-10 NOTE — Telephone Encounter (Signed)
**  After Hours/ Emergency Line Call**  Received a call to report that Rosaura Carpenter was recently seen in urgent care for a head injury due to an old liquor bottle that she was using as a vase fell on her head.  Endorsing dizziness and nausea. Was told to go get CT scan if nausea return. Took a 4 mg zofran 48 minutes ago. Continue to be nauseous. Headaches have occurred since injury and ibuprofen usually helps.  Denying Vision changes and vomiting.  Recommended that she allow the zofran more time to work before presenting to the ED for further evaluation. Red flags discussed.  Will forward to PCP.  Gerlene Fee, DO PGY-2, [facility] 04/10/2020 10:48 PM

## 2020-04-12 ENCOUNTER — Other Ambulatory Visit: Payer: Self-pay

## 2020-04-12 ENCOUNTER — Ambulatory Visit (INDEPENDENT_AMBULATORY_CARE_PROVIDER_SITE_OTHER): Payer: Medicaid Other | Admitting: Family Medicine

## 2020-04-12 ENCOUNTER — Encounter: Payer: Self-pay | Admitting: Family Medicine

## 2020-04-12 DIAGNOSIS — M25851 Other specified joint disorders, right hip: Secondary | ICD-10-CM | POA: Insufficient documentation

## 2020-04-12 DIAGNOSIS — M7631 Iliotibial band syndrome, right leg: Secondary | ICD-10-CM | POA: Diagnosis not present

## 2020-04-12 DIAGNOSIS — S73191A Other sprain of right hip, initial encounter: Secondary | ICD-10-CM | POA: Insufficient documentation

## 2020-04-12 NOTE — Progress Notes (Signed)
    SUBJECTIVE:   CHIEF COMPLAINT / HPI:   Headache/nausea Michelle [name] reports that she is concerned about her ongoing headaches and nausea for roughly the past week.  She reports that 1 week ago, she was resting in bed when a heavy glass decanter fell off of a shelf next to her bed, fell 4 feet and hit her on the forehead.  She did not feel any significant dizziness or nausea at the time of the injury.  She did grow concerned about the event and was later assessed at urgent care.  No head imaging was performed which she was told that she likely has a minor concussion and was encouraged to follow-up with her PCP.  Since this event, she has noted a daily headache with mild nausea.  She has been treating her nausea with Zofran with some success.  Additionally, she has a history of migraine and was recently seen by her neurologist who recommended restarting sumatriptan for history of migraines which seem to be worsening.  Finally, she also has a recent history of hard stools every 3 days.  IT band syndrome She has been seen twice for this injury previously and has been recommended treatment with NSAIDs and appropriate stretching.  The symptoms have persisted for roughly the past 2 months.  She now notices this discomfort mostly at night when she tries to lie on her right side but cannot get comfortable.  PERTINENT  PMH / PSH: Migraine, chronic rhinitis, anxiety  OBJECTIVE:   BP (!) 92/58   Pulse 87   Ht 5\' 2"  (1.575 m)   Wt 145 lb 6 oz (65.9 kg)   LMP 08/14/2016 (Exact Date)   SpO2 99%   BMI 26.59 kg/m    General: Resting comfortably in a chair in the exam room.  No acute distress. HEENT: Cranial nerves II through XII intact. Respiratory: Breathing comfortably on room air.  No respiratory distress. Right hip: Inspection: No gross abnormality or deformity. Palpation: Mild tenderness to palpation inferior to the right trochanter. ROM normal for hip flexion and extension No special testing  performed.  ASSESSMENT/PLAN:   IT band syndrome She has been seen on several occasions for this issue.  It does not seem to improved with standard NSAID use and stretching at home.  She was offered physical therapy today but declined in order to wait for specific recommendations from sports medicine. -Placed referral to sports medicine.   Headache/nausea Likely multifactorial.  The mechanism of injury described seems my nine to have caused any degree of concussion.  Furthermore, it did not seem to be a mark or causing a bump on her head.  It is possible that some minor concussion is contributing to her ongoing headache with nausea.  She was encouraged to rest her brain by standing less time in front of screens like cell phones and TVs.  She is also encouraged to use Tylenol for headaches.  It does seem like her history of migraines may be contributing and she was advised to continue to follow her neurologist recommendations.  Finally, constipation may also be contributing to the nausea that she associates with the syndrome and she was encouraged to use MiraLAX for that.  Overall I have a very low suspicion for any intracranial hemorrhage.  I do not think that head imaging is warranted.  She was encouraged to return to clinic if symptoms persisted past another 2-4 weeks.  Michelle Haymaker, MD [place]

## 2020-04-12 NOTE — Assessment & Plan Note (Signed)
She has been seen on several occasions for this issue.  It does not seem to improved with standard NSAID use and stretching at home.  She was offered physical therapy today but declined in order to wait for specific recommendations from sports medicine. -Placed referral to sports medicine.

## 2020-04-12 NOTE — Patient Instructions (Signed)
Headache/nausea: Based on the symptoms you are describing, it sounds like there are number of things that might be contributing to current headache/nausea.  It sounds like your recent head trauma, history of migraines and possible constipation could all be contributing to these things.  I am a little bit surprised that the mechanism of injury you described would cause a concussion although it is possible.  I recommend that you try to reduce the time you spend in front of the screen (your cell phone or TV) to help provide some brain rest and appropriate recovery following a concussion.  Please also continue to follow the advice of your neurologist for the appropriate treatment of your headaches.  You can also start taking 1 capful of MiraLAX daily until your bowel movements have become soft and he no longer need to strain.  I expect your symptoms to be totally resolved in 2-4 weeks.  Feel free to come back to clinic in a month if you are still experiencing the symptoms.  Hip pain: I still have a strong suspicion for IT band syndrome.  I think that we have started with appropriate treatment with NSAIDs and stretching.  At this point, I will send you to sports medicine to see if they have any more specific diagnosis or preferred physical therapist for you.

## 2020-04-18 ENCOUNTER — Encounter (HOSPITAL_COMMUNITY): Payer: Self-pay | Admitting: Emergency Medicine

## 2020-04-18 ENCOUNTER — Other Ambulatory Visit: Payer: Self-pay

## 2020-04-18 ENCOUNTER — Emergency Department (HOSPITAL_COMMUNITY)
Admission: EM | Admit: 2020-04-18 | Discharge: 2020-04-19 | Disposition: A | Discharge status: Home / Self Care | Payer: Medicaid Other | Attending: Emergency Medicine | Admitting: Emergency Medicine

## 2020-04-18 DIAGNOSIS — I10 Essential (primary) hypertension: Secondary | ICD-10-CM | POA: Insufficient documentation

## 2020-04-18 DIAGNOSIS — J45909 Unspecified asthma, uncomplicated: Secondary | ICD-10-CM | POA: Insufficient documentation

## 2020-04-18 DIAGNOSIS — Z87891 Personal history of nicotine dependence: Secondary | ICD-10-CM | POA: Diagnosis not present

## 2020-04-18 DIAGNOSIS — R102 Pelvic and perineal pain: Secondary | ICD-10-CM | POA: Insufficient documentation

## 2020-04-18 DIAGNOSIS — R1032 Left lower quadrant pain: Secondary | ICD-10-CM | POA: Insufficient documentation

## 2020-04-18 DIAGNOSIS — N200 Calculus of kidney: Secondary | ICD-10-CM | POA: Diagnosis not present

## 2020-04-18 DIAGNOSIS — R52 Pain, unspecified: Secondary | ICD-10-CM

## 2020-04-18 DIAGNOSIS — R109 Unspecified abdominal pain: Secondary | ICD-10-CM

## 2020-04-18 DIAGNOSIS — R110 Nausea: Secondary | ICD-10-CM | POA: Diagnosis not present

## 2020-04-18 LAB — CBC
HCT: 39.1 % (ref 36.0–46.0)
Hemoglobin: 13.1 g/dL (ref 12.0–15.0)
MCH: 31.4 pg (ref 26.0–34.0)
MCHC: 33.5 g/dL (ref 30.0–36.0)
MCV: 93.8 fL (ref 80.0–100.0)
Platelets: 251 10*3/uL (ref 150–400)
RBC: 4.17 MIL/uL (ref 3.87–5.11)
RDW: 11.6 % (ref 11.5–15.5)
WBC: 8.7 10*3/uL (ref 4.0–10.5)
nRBC: 0 % (ref 0.0–0.2)

## 2020-04-18 LAB — URINALYSIS, ROUTINE W REFLEX MICROSCOPIC
Bilirubin Urine: NEGATIVE
Glucose, UA: NEGATIVE mg/dL
Hgb urine dipstick: NEGATIVE
Ketones, ur: NEGATIVE mg/dL
Leukocytes,Ua: NEGATIVE
Nitrite: NEGATIVE
Protein, ur: NEGATIVE mg/dL
Specific Gravity, Urine: 1.01 (ref 1.005–1.030)
pH: 7 (ref 5.0–8.0)

## 2020-04-18 LAB — LIPASE, BLOOD: Lipase: 27 U/L (ref 11–51)

## 2020-04-18 LAB — COMPREHENSIVE METABOLIC PANEL
ALT: 20 U/L (ref 0–44)
AST: 18 U/L (ref 15–41)
Albumin: 3.8 g/dL (ref 3.5–5.0)
Alkaline Phosphatase: 58 U/L (ref 38–126)
Anion gap: 9 (ref 5–15)
BUN: 13 mg/dL (ref 6–20)
CO2: 26 mmol/L (ref 22–32)
Calcium: 9 mg/dL (ref 8.9–10.3)
Chloride: 105 mmol/L (ref 98–111)
Creatinine, Ser: 0.79 mg/dL (ref 0.44–1.00)
GFR calc Af Amer: >60 mL/min (ref >60)
GFR calc non Af Amer: >60 mL/min (ref >60)
Glucose, Bld: 108 mg/dL — ABNORMAL HIGH (ref 70–99)
Potassium: 3.8 mmol/L (ref 3.5–5.1)
Sodium: 140 mmol/L (ref 135–145)
Total Bilirubin: 0.3 mg/dL (ref 0.3–1.2)
Total Protein: 6.7 g/dL (ref 6.5–8.1)

## 2020-04-18 LAB — I-STAT BETA HCG BLOOD, ED (MC, WL, AP ONLY): I-stat hCG, quantitative: <5 m[IU]/mL (ref <5)

## 2020-04-18 NOTE — ED Triage Notes (Signed)
C/o LLQ pain since last night with nausea.  Denies vomiting and diarrhea.

## 2020-04-19 ENCOUNTER — Emergency Department (HOSPITAL_COMMUNITY): Payer: Medicaid Other

## 2020-04-19 ENCOUNTER — Encounter (HOSPITAL_COMMUNITY): Payer: Self-pay | Admitting: Student

## 2020-04-19 DIAGNOSIS — R1032 Left lower quadrant pain: Secondary | ICD-10-CM | POA: Diagnosis not present

## 2020-04-19 DIAGNOSIS — N200 Calculus of kidney: Secondary | ICD-10-CM | POA: Diagnosis not present

## 2020-04-19 LAB — GC/CHLAMYDIA PROBE AMP (~~LOC~~) NOT AT ARMC
Chlamydia: NEGATIVE
Comment: NEGATIVE
Comment: NORMAL
Neisseria Gonorrhea: NEGATIVE

## 2020-04-19 LAB — WET PREP, GENITAL
Clue Cells Wet Prep HPF POC: NONE SEEN
Sperm: NONE SEEN
Trich, Wet Prep: NONE SEEN
Yeast Wet Prep HPF POC: NONE SEEN

## 2020-04-19 MED ORDER — SODIUM CHLORIDE 0.9 % IV BOLUS
1000.0000 mL | Freq: Once | INTRAVENOUS | Status: AC
  Administered 2020-04-19: 1000 mL via INTRAVENOUS

## 2020-04-19 MED ORDER — KETOROLAC TROMETHAMINE 30 MG/ML IJ SOLN
15.0000 mg | Freq: Once | INTRAMUSCULAR | Status: AC
  Administered 2020-04-19: 15 mg via INTRAVENOUS
  Filled 2020-04-19: qty 1

## 2020-04-19 MED ORDER — TAMSULOSIN HCL 0.4 MG PO CAPS: 0.4000 mg | ORAL_CAPSULE | Freq: Every day | ORAL | 0 refills | Status: DC

## 2020-04-19 MED ORDER — ONDANSETRON 4 MG PO TBDP: 4.0000 mg | ORAL_TABLET | Freq: Three times a day (TID) | ORAL | 0 refills | Status: DC | PRN

## 2020-04-19 MED ORDER — SODIUM CHLORIDE 0.9 % IV BOLUS
500.0000 mL | Freq: Once | INTRAVENOUS | Status: AC
  Administered 2020-04-19: 500 mL via INTRAVENOUS

## 2020-04-19 MED ORDER — ONDANSETRON HCL 4 MG/2ML IJ SOLN
4.0000 mg | Freq: Once | INTRAMUSCULAR | Status: AC
  Administered 2020-04-19: 4 mg via INTRAVENOUS
  Filled 2020-04-19: qty 2

## 2020-04-19 NOTE — ED Provider Notes (Signed)
[name] Provider Note   CSN: [id number] Arrival date & time: 04/18/20  1601     History Chief Complaint  Patient presents with  . Abdominal Pain    [facility]le [name] is a 29 y.o. female with a history of asthma, anxiety, depression, bipolar disorder, hypertension, nephrolithiasis, prior pyelonephritis, seizures, and prior abdominal surgeries including appendectomy, hysterectomy, and tubal ligation who presents to the ED with complaints of abdominal pain that began 2 days ago.  Patient states the pain is located in her left lower quadrant/left pelvic area, proximal/wanes in severity, worse with movement, no alleviating factors.  Has had some associated nausea.  She states this does feel somewhat similar to her prior kidney infection/kidney stones.  She denies any fever, chills, vomiting, diarrhea, constipation, dysuria, hematuria, flank pain, vaginal discharge, or vaginal bleeding.  She is sexually active in a monogamous relationship without concern for STD.   HPI     Past Medical History:  Diagnosis Date  . Anxiety   . Asthma   . Bipolar disorder ([place])    no current med.  . Depression    no current med.  . Dermatitis 01/16/2019  . Family history of adverse reaction to anesthesia    states mother and sister are hard to wake up post-op  . Gestational diabetes   . History of seizure 06/20/2012   x 1 - during delivery of child(eclampsia)  . Hypertension   . Lipoma of lower back 08/2015  . Migraine   . Nephrolithiasis   . PTSD (post-traumatic stress disorder)   . PTSD (post-traumatic stress disorder)   . Pyelonephritis 01/16/2019  . Seizures ([place])   . Tachycardia   . TMJ (dislocation of temporomandibular joint)   . Vertigo     Patient Active Problem List   Diagnosis Date Noted  . 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 Right hip pain 02/03/2020  . Finger pain, right 10/23/2019  . Eustachian tube dysfunction, bilateral 12/16/2018  . Bee sting  allergy 11/06/2018  . Allergic reaction 11/04/2018  . Mild intermittent asthma without complication 72/03/4708  . Chronic rhinitis 11/04/2018  . Chronic urticaria 10/04/2018  . Rash 11/16/2017  . Easy bruising 11/16/2017  . Anxiety 10/27/2016  . SUI (stress urinary incontinence, female) 04/10/2016  . Migraine 02/10/2016  . PTSD 03/02/2010  . Tobacco use disorder 02/07/2008  . 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 ABDOMINAL HYSTERECTOMY    . ADENOIDECTOMY, TONSILLECTOMY AND MYRINGOTOMY WITH TUBE PLACEMENT  1990's  . APPENDECTOMY    . CESAREAN SECTION  06/20/2012   Procedure: CESAREAN SECTION;  Surgeon: Emily Filbert, MD;  Location: [facility];  Service: Obstetrics;  Laterality: N/A;  . LAPAROSCOPIC APPENDECTOMY  07/25/2012   Procedure: APPENDECTOMY LAPAROSCOPIC;  Surgeon: Zenovia Jarred, MD;  Location: [facility];  Service: General;  Laterality: N/A;  . LAPAROSCOPIC TUBAL LIGATION Bilateral 08/04/2014   Procedure: BILATERAL LAPAROSCOPIC TUBAL LIGATION;  Surgeon: Woodroe Mode, MD;  Location: [facility];  Service: Gynecology;  Laterality: Bilateral;  . LIPOMA EXCISION N/A 09/16/2015   Procedure: EXCISION LIPOMA LUMBAR REGION;  Surgeon: Donnie Mesa, MD;  Location: [facility];  Service: General;  Laterality: N/A;  . TONSILLECTOMY    . WISDOM TOOTH EXTRACTION       OB History    Gravida  3   Para  3   Term  2   Preterm  1  AB  0   Living  3     SAB  0   TAB  0   Ectopic  0   Multiple  0   Live Births  1        Obstetric Comments  C-Section Indication: Non-reassuring fetal tracing, growth delay & marked proteinuria & Pre-Eclampsia Eclamptic Seizure during C-section delivery        Family History  Problem Relation Age of Onset  . Breast cancer Paternal Grandmother   . Colon cancer Paternal Grandfather   . Colon cancer Maternal Grandfather   . Colon polyps Maternal Aunt   . Diabetes  Mother   . Anesthesia problems Mother        hard to wake up post-op  . Diabetes Sister   . Anesthesia problems Sister        hard to wake up post-op  . Diabetes Father   . Heart disease Father   . Eczema Neg Hx   . Allergic rhinitis Neg Hx   . Asthma Neg Hx     Social History   Tobacco Use  . Smoking status: Former Smoker    Packs/day: 0.00    Years: 14.00    Pack years: 0.00    Types: Cigarettes    Quit date: 03/17/2017    Years since quitting: 3.0  . Smokeless tobacco: Never Used  Vaping Use  . Vaping Use: Some days  Substance Use Topics  . Alcohol use: No  . Drug use: No    Home Medications Prior to Admission medications   Medication Sig Start Date End Date Taking? Authorizing Provider  acetaminophen (TYLENOL) 325 MG tablet Take 650 mg by mouth every 6 (six) hours as needed.    [provider]  albuterol (VENTOLIN HFA) 108 (90 Base) MCG/ACT inhaler INHALE 2 PUFFS INTO THE LUNGS EVERY 4 HOURS AS NEEDED FOR WHEEZING OR SHORTNESS OF BREATH 06/27/19   Myles Gip, DO  cetirizine (ZYRTEC) 10 MG tablet TAKE 2 TABLETS(20 MG) BY MOUTH TWICE DAILY 01/02/20   Garnet Sierras, DO  EPINEPHrine (EPIPEN 2-PAK) 0.3 mg/0.3 mL IJ SOAJ injection Inject 0.3 mLs (0.3 mg total) into the muscle once as needed for anaphylaxis. 01/23/20   Althea Charon, FNP  famotidine (PEPCID) 20 MG tablet Take 1 tablet (20 mg total) by mouth 2 (two) times daily. 01/23/20   Althea Charon, FNP  fluticasone (FLONASE) 50 MCG/ACT nasal spray Place 1 spray into both nostrils daily. Patient not taking: Reported on 04/09/2020 01/06/19   Garnet Sierras, DO  ibuprofen (ADVIL) 600 MG tablet Take 1 tablet (600 mg total) by mouth every 8 (eight) hours as needed. 01/16/19   Bonnita Hollow, MD  nortriptyline (PAMELOR) 25 MG capsule Take 1 capsule (25 mg total) by mouth at bedtime. 04/09/20   Cameron Sprang, MD    Allergies    Adhesive [tape] and Bee venom  Review of Systems   Review of Systems  Constitutional:  Negative for chills and fever.  Respiratory: Negative for shortness of breath.   Cardiovascular: Negative for chest pain.  Gastrointestinal: Positive for abdominal pain and nausea. Negative for abdominal distention, anal bleeding, blood in stool, constipation, diarrhea and vomiting.  Genitourinary: Positive for pelvic pain. Negative for dysuria, hematuria, vaginal bleeding and vaginal discharge.  Neurological: Negative for syncope.  All other systems reviewed and are negative.   Physical Exam Updated Vital Signs BP 107/67   Pulse 85   Temp 98.4 F (36.9 C) (Oral)  Resp 17   Ht 5\' 2"  (1.575 m)   Wt 65 kg   LMP 08/14/2016 (Exact Date)   SpO2 100%   BMI 26.21 kg/m   Physical Exam Vitals and nursing note reviewed. Exam conducted with a chaperone present.  Constitutional:      General: She is not in acute distress.    Appearance: She is well-developed. She is not toxic-appearing.  HENT:     Head: Normocephalic and atraumatic.  Eyes:     General:        Right eye: No discharge.        Left eye: No discharge.     Conjunctiva/sclera: Conjunctivae normal.  Cardiovascular:     Rate and Rhythm: Normal rate and regular rhythm.  Pulmonary:     Effort: Pulmonary effort is normal. No respiratory distress.     Breath sounds: Normal breath sounds. No wheezing, rhonchi or rales.  Abdominal:     General: There is no distension.     Palpations: Abdomen is soft.     Tenderness: There is abdominal tenderness (LLQ & L suprapubic. ). There is no right CVA tenderness, left CVA tenderness, guarding or rebound.  Genitourinary:    Labia:        Right: No lesion.        Left: No lesion.      Vagina: No vaginal discharge or bleeding.     Cervix: No cervical motion tenderness.     Adnexa:        Right: No mass or tenderness.         Left: Tenderness present. No mass.    Musculoskeletal:     Cervical back: Neck supple.  Skin:    General: Skin is warm and dry.     Findings: No rash.    Neurological:     Mental Status: She is alert.     Comments: Clear speech.   Psychiatric:        Behavior: Behavior normal.    ED Results / Procedures / Treatments   Labs (all labs ordered are listed, but only abnormal results are displayed) Labs Reviewed  WET PREP, GENITAL - Abnormal; Notable for the following components:      Result Value   WBC, Wet Prep HPF POC MANY (*)    All other components within normal limits  COMPREHENSIVE METABOLIC PANEL - Abnormal; Notable for the following components:   Glucose, Bld 108 (*)    All other components within normal limits  URINALYSIS, ROUTINE W REFLEX MICROSCOPIC - Abnormal; Notable for the following components:   Color, Urine STRAW (*)    All other components within normal limits  LIPASE, BLOOD  CBC  I-STAT BETA HCG BLOOD, ED ([facility], [facility], [facility] ONLY)  GC/CHLAMYDIA PROBE AMP ([place]) NOT AT [facility]    EKG None  Radiology No results found.  Procedures Procedures (including critical care time)  Medications Ordered in ED Medications - No data to display  ED Course  I have reviewed the triage vital signs and the nursing notes.  Pertinent labs & imaging results that were available during my care of the patient were reviewed by me and considered in my medical decision making (see chart for details).    MDM Rules/Calculators/A&P                         Patient presents to the ED with complaints of abdominal pain.  Patient is nontoxic, resting comfortably, initial mild  tachycardia normalized.  On exam patient has left lower quadrant and left suprapubic tenderness palpation as well as some left adnexal tenderness.  No significant discharge, no cervical motion tenderness, exam does not seem consistent with PID in this patient who is in a monogamous relationship.   Additional history obtained:  Additional history obtained from chart review nursing note reviewed.. Previous records obtained and reviewed  Lab Tests:  I reviewed and  interpreted labs, which included:  CBC, CMP, lipase, urinalysis, wet prep: Grossly unremarkable.  No UTI, no leukocytosis, no electrolyte derangement.  No trichomoniasis, BV, or yeast on wet prep.  I have ordered Toradol for pain, Zofran for nausea, and fluids for hydration.  Plan for pelvic ultrasound for further assessment- considering ovarian cyst, ovarian torsion, nephrolithiasis primarily. Diverticulitis felt to be less likely w/ her age and no bowel movement changes, prior CT imaging reviewed w/o findings of diverticulosis. Preg test negative therefore doubt ectopic. No significant discharge or concern for STI per patient, pelvic exam did not seem consistent w/ PID. UA without UTI.   06:30: Patient care signed out to [facility] PA-C  @ change of shift pending [place], re-assessment & disposition.   Portions of this note were generated with [employer]. Dictation errors may occur despite best attempts at proofreading.  Final Clinical Impression(s) / ED Diagnoses Final diagnoses:  Pain    Rx / DC Orders ED Discharge Orders    None       Amaryllis Dyke, PA-C 04/19/20 2481    Fatima Blank, MD 04/19/20 [phone]

## 2020-04-19 NOTE — ED Provider Notes (Signed)
[name] is a 29 y.o. female, presenting to the ED with left abdominal and flank pain she states is quite similar to what she is experienced with kidney stone.  HPI from [place], PA-C: "Michelle [name] is a 29 y.o. female with a history of asthma, anxiety, depression, bipolar disorder, hypertension, nephrolithiasis, prior pyelonephritis, seizures, and prior abdominal surgeries including appendectomy, hysterectomy, and tubal ligation who presents to the ED with complaints of abdominal pain that began 2 days ago.  Patient states the pain is located in her left lower quadrant/left pelvic area, proximal/wanes in severity, worse with movement, no alleviating factors.  Has had some associated nausea.  She states this does feel somewhat similar to her prior kidney infection/kidney stones.  She denies any fever, chills, vomiting, diarrhea, constipation, dysuria, hematuria, flank pain, vaginal discharge, or vaginal bleeding.  She is sexually active in a monogamous relationship without concern for STD."  Past Medical History:  Diagnosis Date  . Anxiety   . Asthma   . Bipolar disorder ([place])    no current med.  . Depression    no current med.  . Dermatitis 01/16/2019  . Family history of adverse reaction to anesthesia    states mother and sister are hard to wake up post-op  . Gestational diabetes   . History of seizure 06/20/2012   x 1 - during delivery of child(eclampsia)  . Hypertension   . Lipoma of lower back 08/2015  . Migraine   . Nephrolithiasis   . PTSD (post-traumatic stress disorder)   . PTSD (post-traumatic stress disorder)   . Pyelonephritis 01/16/2019  . Seizures ([place])   . Tachycardia   . TMJ (dislocation of temporomandibular joint)   . Vertigo     Physical Exam  BP 106/69   Pulse 85   Temp 98.4 F (36.9 C) (Oral)   Resp 15   Ht 5\' 2"  (1.575 m)   Wt 65 kg   LMP 08/14/2016 (Exact Date)   SpO2 100%   BMI 26.21 kg/m   Physical Exam Vitals and nursing note  reviewed.  Constitutional:      General: She is not in acute distress.    Appearance: She is well-developed. She is not diaphoretic.  HENT:     Head: Normocephalic and atraumatic.     Mouth/Throat:     Mouth: Mucous membranes are moist.     Pharynx: Oropharynx is clear.  Eyes:     Conjunctiva/sclera: Conjunctivae normal.  Cardiovascular:     Rate and Rhythm: Normal rate and regular rhythm.     Pulses: Normal pulses.          Radial pulses are 2+ on the right side and 2+ on the left side.       Posterior tibial pulses are 2+ on the right side and 2+ on the left side.     Heart sounds: Normal heart sounds.     Comments: Tactile temperature in the extremities appropriate and equal bilaterally. Pulmonary:     Effort: Pulmonary effort is normal. No respiratory distress.     Breath sounds: Normal breath sounds.  Abdominal:     Palpations: Abdomen is soft.     Tenderness: There is no abdominal tenderness. There is no guarding.  Genitourinary:    Comments: Pelvic exam performed by previous provider.  Please see their note for further details. Musculoskeletal:     Cervical back: Neck supple.     Right lower leg: No edema.  Left lower leg: No edema.  Skin:    General: Skin is warm and dry.  Neurological:     Mental Status: She is alert.  Psychiatric:        Mood and Affect: Mood and affect normal.        Speech: Speech normal.        Behavior: Behavior normal.     ED Course/Procedures     Procedures   Abnormal Labs Reviewed  WET PREP, GENITAL - Abnormal; Notable for the following components:      Result Value   WBC, Wet Prep HPF POC MANY (*)    All other components within normal limits  COMPREHENSIVE METABOLIC PANEL - Abnormal; Notable for the following components:   Glucose, Bld 108 (*)    All other components within normal limits  URINALYSIS, ROUTINE W REFLEX MICROSCOPIC - Abnormal; Notable for the following components:   Color, Urine STRAW (*)    All other  components within normal limits    CT Renal Stone Study  Result Date: 04/19/2020 CLINICAL DATA:  Left flank pain EXAM: CT ABDOMEN AND PELVIS WITHOUT CONTRAST TECHNIQUE: Multidetector CT imaging of the abdomen and pelvis was performed following the standard protocol without IV contrast. COMPARISON:  04/07/2019 FINDINGS: Lower chest: No acute abnormality. Hepatobiliary: No focal liver abnormality is seen. No gallstones, gallbladder wall thickening, or biliary dilatation. Pancreas: Unremarkable. No pancreatic ductal dilatation or surrounding inflammatory changes. Spleen: Normal in size without focal abnormality. Adrenals/Urinary Tract: Unremarkable adrenal glands. There is increased density within the bilateral medullary pyramids, more pronounced on the right. There are multiple coarse nonobstructing right-sided renal calculi, largest measuring up to 5 mm. There are a few tiny punctate nonobstructing 1-2 mm left renal calculi. No hydronephrosis. Bilateral ureters are unremarkable. Urinary bladder is unremarkable for the degree of distension. Unchanged bilateral pelvic phleboliths. Stomach/Bowel: Stomach is within normal limits. Appendix not visualized, surgically absent. No evidence of bowel wall thickening, distention, or inflammatory changes. Vascular/Lymphatic: No significant vascular findings are present. No enlarged abdominal or pelvic lymph nodes. Reproductive: Status post hysterectomy. No adnexal masses. Other: No free fluid. No abdominopelvic fluid collection. No pneumoperitoneum. No abdominal wall hernia. Musculoskeletal: No acute or significant osseous findings. IMPRESSION: 1. Multiple bilateral nonobstructing renal calculi. 2. Increased density within the bilateral medullary pyramids, more pronounced on the right, which can be seen with medullary nephrocalcinosis. Electronically Signed   By: Davina Poke D.O.   On: 04/19/2020 09:14   US PELVIC COMPLETE W TRANSVAGINAL AND TORSION R/O  Result  Date: 04/19/2020 CLINICAL DATA:  Left lower quadrant/abdominal pain. Status post hysterectomy. EXAM: TRANSABDOMINAL AND TRANSVAGINAL ULTRASOUND OF PELVIS DOPPLER ULTRASOUND OF OVARIES TECHNIQUE: Both transabdominal and transvaginal ultrasound examinations of the pelvis were performed. Transabdominal technique was performed for global imaging of the pelvis including uterus, ovaries, adnexal regions, and pelvic cul-de-sac. It was necessary to proceed with endovaginal exam following the transabdominal exam to visualize the ovaries. Color and duplex Doppler ultrasound was utilized to evaluate blood flow to the ovaries. COMPARISON:  CT stone study 04/07/2019 FINDINGS: Uterus Measurements: Surgically absent. Endometrium Surgically absent. Right ovary Measurements: 3.4 x 1.8 x 1.7 cm = volume: 5 mL. Normal appearance/no adnexal mass. Left ovary Measurements: 3.6 x 2.2 x 2.3 cm = volume: 10 ML. Normal appearance/no adnexal mass. Pulsed Doppler evaluation of both ovaries demonstrates normal low-resistance arterial and venous waveforms. Other findings No abnormal free fluid. IMPRESSION: Uterus surgically absent. Unremarkable sonographic appearance of the ovaries. No evidence for ovarian torsion. Electronically  Signed   By: Misty Stanley M.D.   On: 04/19/2020 07:07    MDM   Clinical Course as of Apr 19 1024  Mon Apr 19, 2020  0814 Discussed results.  Continues to be symptom-free.  Abdominal exam benign.   [SJ]    Clinical Course User Index [SJ] Lorayne Bender, PA-C   Patient care handoff report received from [facility], PA-C. Plan: Pelvic ultrasound pending.  Reevaluate patient.  Possibly CT renal stone study if pelvic ultrasound is unremarkable.   Patient presents with left lower abdominal pain for the past couple days. Patient is nontoxic appearing, afebrile, not tachycardic, not tachypneic, not hypotensive, maintains excellent SPO2 on room air, and is in no apparent distress.   I have reviewed the  patient's chart to obtain more information.   I reviewed and interpreted the patient's labs and radiological studies. Pelvic ultrasound appears unremarkable for acute process. CT renal stone study shows multiple bilateral nonobstructing renal calculi as well as evidence of possible nephrocalcinosis. According to up-to-date, this process can cause renal colic symptoms.  She could also be having pain from multiple renal stones passing in succession. Regardless, she will need to follow-up with urology on this matter.  We discussed pain management options.  Declined additional pain management, such as narcotic analgesics.  The patient was given instructions for home care as well as return precautions. Patient voices understanding of these instructions, accepts the plan, and is comfortable with discharge.    Vitals:   04/18/20 2338 04/19/20 0248 04/19/20 0432 04/19/20 0502  BP: 108/72 107/67  106/69  Pulse: 95 85    Resp: 18 17  15   Temp: 98.1 F (36.7 C) 98.4 F (36.9 C)    TempSrc: Oral Oral    SpO2: 100% 100%    Weight:   65 kg   Height:   5\' 2"  (1.575 m)       Lorayne Bender, PA-C 04/19/20 1029    Pattricia Boss, MD 04/19/20 1451

## 2020-04-19 NOTE — ED Notes (Signed)
Pt transported to ultrasound.

## 2020-04-19 NOTE — Discharge Instructions (Addendum)
   Hydration: Have a goal of half a liter of water every hour or two. Antiinflammatory medications: Take 600 mg of ibuprofen every 6 hours or 440 mg (over the counter dose) to 500 mg (prescription dose) of naproxen every 12 hours for the next 3 days. After this time, these medications may be used as needed for pain. Take these medications with food to avoid upset stomach. Choose only one of these medications, do not take them together. Acetaminophen: Should you continue to have additional pain while taking the ibuprofen or naproxen, you may add in acetaminophen (generic for Tylenol) as needed. Your daily total maximum amount of acetaminophen from all sources should be limited to 4000mg /day for persons without liver problems, or 2000mg /day for those with liver problems. Tamsulosin: This medication is designed to help the stone pass.  Take this medication daily until stone passes. Nausea/vomiting: Use the ondansetron (generic for Zofran) for nausea or vomiting.  This medication may not prevent all vomiting or nausea, but can help facilitate better hydration. Things that can help with nausea/vomiting also include peppermint/menthol candies, vitamin B12, and ginger. Follow-up: Follow-up with the urologist as soon as possible on this matter. Return: Return to the ED for significantly increased pain, difficulty urinating, pain with urination, fever, uncontrolled vomiting, or any other major concerns.  For prescription assistance, may try using prescription discount sites or apps, such as goodrx.com or Good Rx smart phone app.

## 2020-04-20 ENCOUNTER — Telehealth: Payer: Self-pay | Admitting: *Deleted

## 2020-04-20 ENCOUNTER — Telehealth: Payer: Self-pay

## 2020-04-20 NOTE — Telephone Encounter (Signed)
Transition Care Management Follow-up Telephone Call  Date of discharge and from where: [facility] 04[facility]19/[facility]  How have you been since you were released from the hospital? Doing well   Any questions or concerns? No  Items Reviewed:  Did the pt receive and understand the discharge instructions provided? Yes   Medications obtained and verified? Yes   Any new allergies since your discharge? No   Dietary orders reviewed? Yes  Do you have support at home? Yes   Functional Questionnaire: (I = Independent and D = Dependent) ADLs: I  Bathing/Dressing- I  Meal Prep- I  Eating- I  Maintaining continence- I  Transferring/Ambulation- I  Managing Meds- I  Follow up appointments reviewed:   PCP Hospital f/u appt confirmed? No  going to call and schedule   Specialist Hospital f/u appt confirmed? No    Are transportation arrangements needed? No   If their condition worsens, is the pt aware to call PCP or go to the Emergency Dept.? Yes  Was the patient provided with contact information for the PCP's office or ED? Yes  Was to pt encouraged to call back with questions or concerns? Yes

## 2020-04-20 NOTE — Telephone Encounter (Signed)
Email sent to [facility]  to schedule follow up appointment Michelle [name] [facility]  [phone]

## 2020-04-23 DIAGNOSIS — N200 Calculus of kidney: Secondary | ICD-10-CM | POA: Diagnosis not present

## 2020-04-26 ENCOUNTER — Encounter: Payer: Self-pay | Admitting: *Deleted

## 2020-04-26 ENCOUNTER — Telehealth: Payer: Self-pay | Admitting: *Deleted

## 2020-04-26 DIAGNOSIS — M7631 Iliotibial band syndrome, right leg: Secondary | ICD-10-CM

## 2020-04-26 NOTE — Telephone Encounter (Signed)
Patient left a message that she was supposed to have a sports medicine referral placed at the end of September but hasn't heard from them.  Checked chart and referral wasn't placed.  Will send to MD to make him aware that I will go ahead and put the referral in and send it on.  [facility]

## 2020-04-26 NOTE — Telephone Encounter (Signed)
Thank you, [name].  I'm sorry I didn't take care of that. Michelle [name]

## 2020-05-04 ENCOUNTER — Encounter: Payer: Self-pay | Admitting: Family Medicine

## 2020-05-04 ENCOUNTER — Ambulatory Visit (INDEPENDENT_AMBULATORY_CARE_PROVIDER_SITE_OTHER): Payer: Medicaid Other

## 2020-05-04 ENCOUNTER — Ambulatory Visit (INDEPENDENT_AMBULATORY_CARE_PROVIDER_SITE_OTHER): Payer: Medicaid Other | Admitting: Family Medicine

## 2020-05-04 ENCOUNTER — Other Ambulatory Visit: Payer: Self-pay

## 2020-05-04 DIAGNOSIS — Z23 Encounter for immunization: Secondary | ICD-10-CM

## 2020-05-04 DIAGNOSIS — M25851 Other specified joint disorders, right hip: Secondary | ICD-10-CM | POA: Diagnosis not present

## 2020-05-04 NOTE — Assessment & Plan Note (Addendum)
Symptoms seem more consistent with impingement.  No pain over the greater trochanters to suggest bursitis.  Has some weakness with hip abduction.  No tenderness over the ASIS.  Seems less likely for labral pathology but possible for cam lesion. -Counseled on home exercise therapy and supportive care. - provided pennsaid samples -Could consider physical therapy or imaging.

## 2020-05-04 NOTE — Progress Notes (Signed)
Michelle [name] - 29 y.o. female MRN [id number]  Date of birth: 1991/02/11  SUBJECTIVE:  Including CC & ROS.  Chief Complaint  Patient presents with  . Hip Pain    right x 4 months    Michelle [name] is a 29 y.o. female that is presenting with right hip pain.  The pain is been ongoing for 4 months.  She started noticing the pain after walking occur once.  Denies any history of specific injury.  She only feels the pain when she lies on the affected side.   Review of Systems See HPI   HISTORY: Past Medical, Surgical, Social, and Family History Reviewed & Updated per EMR.   Pertinent Historical Findings include:  Past Medical History:  Diagnosis Date  . Anxiety   . Asthma   . Bipolar disorder ([place])    no current med.  . Depression    no current med.  . Dermatitis 01/16/2019  . Family history of adverse reaction to anesthesia    states mother and sister are hard to wake up post-op  . Gestational diabetes   . History of seizure 06/20/2012   x 1 - during delivery of child(eclampsia)  . Hypertension   . Lipoma of lower back 08/2015  . Migraine   . Nephrolithiasis   . PTSD (post-traumatic stress disorder)   . PTSD (post-traumatic stress disorder)   . Pyelonephritis 01/16/2019  . Seizures ([place])   . Tachycardia   . TMJ (dislocation of temporomandibular joint)   . Vertigo     Past Surgical History:  Procedure Laterality Date  . ABDOMINAL HYSTERECTOMY    . ADENOIDECTOMY, TONSILLECTOMY AND MYRINGOTOMY WITH TUBE PLACEMENT  1990's  . APPENDECTOMY    . CESAREAN SECTION  06/20/2012   Procedure: CESAREAN SECTION;  Surgeon: Emily Filbert, MD;  Location: [facility];  Service: Obstetrics;  Laterality: N/A;  . LAPAROSCOPIC APPENDECTOMY  07/25/2012   Procedure: APPENDECTOMY LAPAROSCOPIC;  Surgeon: Zenovia Jarred, MD;  Location: [facility];  Service: General;  Laterality: N/A;  . LAPAROSCOPIC TUBAL LIGATION Bilateral 08/04/2014   Procedure: BILATERAL LAPAROSCOPIC TUBAL LIGATION;  Surgeon: Woodroe Mode,  MD;  Location: [facility];  Service: Gynecology;  Laterality: Bilateral;  . LIPOMA EXCISION N/A 09/16/2015   Procedure: EXCISION LIPOMA LUMBAR REGION;  Surgeon: Donnie Mesa, MD;  Location: [facility];  Service: General;  Laterality: N/A;  . TONSILLECTOMY    . WISDOM TOOTH EXTRACTION      Family History  Problem Relation Age of Onset  . Breast cancer Paternal Grandmother   . Colon cancer Paternal Grandfather   . Colon cancer Maternal Grandfather   . Colon polyps Maternal Aunt   . Diabetes Mother   . Anesthesia problems Mother        hard to wake up post-op  . Diabetes Sister   . Anesthesia problems Sister        hard to wake up post-op  . Diabetes Father   . Heart disease Father   . Eczema Neg Hx   . Allergic rhinitis Neg Hx   . Asthma Neg Hx     Social History   Socioeconomic History  . Marital status: Single    Spouse name: Not on file  . Number of children: 2  . Years of education: Not on file  . Highest education level: Not on file  Occupational History  . Not on file  Tobacco Use  . Smoking status: Former Smoker    Packs/day:  0.00    Years: 14.00    Pack years: 0.00    Types: Cigarettes    Quit date: 03/17/2017    Years since quitting: 3.1  . Smokeless tobacco: Never Used  Vaping Use  . Vaping Use: Some days  Substance and Sexual Activity  . Alcohol use: No  . Drug use: No  . Sexual activity: Yes    Birth control/protection: Surgical  Other Topics Concern  . Not on file  Social History Narrative   Lives with boyfriend and 3 children in a one story home.  Does not work.  Education: 9th grade. Right handed    Social Determinants of Health   Financial Resource Strain:   . Difficulty of Paying Living Expenses: Not on file  Food Insecurity:   . Worried About Charity fundraiser in the Last Year: Not on file  . Ran Out of Food in the Last Year: Not on file  Transportation Needs:   . Lack of Transportation (Medical): Not on file  . Lack of  Transportation (Non-Medical): Not on file  Physical Activity:   . Days of Exercise per Week: Not on file  . Minutes of Exercise per Session: Not on file  Stress:   . Feeling of Stress : Not on file  Social Connections:   . Frequency of Communication with Friends and Family: Not on file  . Frequency of Social Gatherings with Friends and Family: Not on file  . Attends Religious Services: Not on file  . Active Member of Clubs or Organizations: Not on file  . Attends Archivist Meetings: Not on file  . Marital Status: Not on file  Intimate Partner Violence:   . Fear of Current or Ex-Partner: Not on file  . Emotionally Abused: Not on file  . Physically Abused: Not on file  . Sexually Abused: Not on file     PHYSICAL EXAM:  VS: BP 114/73   Ht 5\' 2"  (1.575 m)   Wt 140 lb (63.5 kg)   LMP 08/14/2016 (Exact Date)   BMI 25.61 kg/m  Physical Exam Gen: NAD, alert, cooperative with exam, well-appearing MSK:  Right hip: No tenderness palpation of the greater trochanter. No tenderness palpation of the ASIS. Normal strength with hip flexion. Weakness with hip abduction. Neurovascularly intact     ASSESSMENT & PLAN:   Hip impingement syndrome, right Symptoms seem more consistent with impingement.  No pain over the greater trochanters to suggest bursitis.  Has some weakness with hip abduction.  No tenderness over the ASIS.  Seems less likely for labral pathology but possible for cam lesion. -Counseled on home exercise therapy and supportive care. - provided pennsaid samples -Could consider physical therapy or imaging.

## 2020-05-04 NOTE — Progress Notes (Signed)
Flu Vaccine administered LD without complication.   See admin for details.  

## 2020-05-04 NOTE — Progress Notes (Signed)
Medication Samples have been provided to the patient.  Drug name: Pennsaid       Strength: 2%        Qty: 2 boxes  LOT: [id number]  Exp.Date: 09/2020  Dosing instructions: Use a pea size amount and rub gently.  The patient has been instructed regarding the correct time, dose, and frequency of taking this medication, including desired effects and most common side effects.   Sherrie George, [place] 4:45 PM 05/04/2020

## 2020-05-04 NOTE — Patient Instructions (Signed)
Good to see you Please alternate heat and ice  Please try the exercises  Please try the rub on medicine   Please send me a message in MyChart with any questions or updates.  Please see me back in 4 weeks.   --Dr. Raeford Razor

## 2020-05-05 ENCOUNTER — Other Ambulatory Visit: Payer: Self-pay

## 2020-05-05 ENCOUNTER — Ambulatory Visit (HOSPITAL_COMMUNITY)
Admission: EM | Admit: 2020-05-05 | Discharge: 2020-05-05 | Disposition: A | Discharge status: Home / Self Care | Payer: Medicaid Other | Attending: Family Medicine | Admitting: Family Medicine

## 2020-05-05 ENCOUNTER — Encounter (HOSPITAL_COMMUNITY): Payer: Self-pay

## 2020-05-05 DIAGNOSIS — R221 Localized swelling, mass and lump, neck: Secondary | ICD-10-CM

## 2020-05-05 NOTE — ED Triage Notes (Signed)
Pt present a bump that is located in the front of her neck. Pt state that it hurts when she moves her neck up and down.  She noticed this today.

## 2020-05-06 DIAGNOSIS — N200 Calculus of kidney: Secondary | ICD-10-CM | POA: Diagnosis not present

## 2020-05-08 NOTE — ED Provider Notes (Signed)
[facility]   [id number] 05/05/20 Arrival Time: 1962  ASSESSMENT & PLAN:  1. Localized swelling, mass or lump of neck     Questions lymph node vs sublingual gland enlargement. Discussed. Day #1. She is comfortable with home observation. No signs of infection.  Recommend:  Follow-up Information     [facility].   Why: If worsening or failing to improve as anticipated. Contact information: [address] [place] [address] [phone]               Reviewed expectations re: course of current medical issues. Questions answered. Outlined signs and symptoms indicating need for more acute intervention. Understanding verbalized. After Visit Summary given.   SUBJECTIVE: History from: patient. Michelle [name] is a 29 y.o. female who reports "feeling a lump under my chin". Noticed today. No assoc pain or fever.  Normal PO intake without n/v/d. No neck pain. No recent illnesses.    OBJECTIVE:  Vitals:   05/05/20 1942  BP: 113/82  Pulse: 93  Temp: 98 F (36.7 C)  TempSrc: Oral  SpO2: 100%    General appearance: alert; no distress Eyes: PERRLA; EOMI; conjunctiva normal HENT: [place]; AT; without nasal congestion Neck: supple; approx 1 cm firm area just under chin around area of sublingual gland; no overlying skin changes Lungs: speaks full sentences without difficulty; unlabored Extremities: no edema Skin: warm and dry Neurologic: normal gait Psychological: alert and cooperative; normal mood and affect    Allergies  Allergen Reactions  . Adhesive [Tape] Hives and Other (See Comments)    EKG or heart monitor pads- caused raised, red areas (WELTS) on the skin; "sensitive" pads fell off  . Bee Venom Hives and Other (See Comments)    Welts, also    Past Medical History:  Diagnosis Date  . Anxiety   . Asthma   . Bipolar disorder ([place])    no current med.  . Depression    no current med.  . Dermatitis  01/16/2019  . Family history of adverse reaction to anesthesia    states mother and sister are hard to wake up post-op  . Gestational diabetes   . History of seizure 06/20/2012   x 1 - during delivery of child(eclampsia)  . Hypertension   . Lipoma of lower back 08/2015  . Migraine   . Nephrolithiasis   . PTSD (post-traumatic stress disorder)   . PTSD (post-traumatic stress disorder)   . Pyelonephritis 01/16/2019  . Seizures ([place])   . Tachycardia   . TMJ (dislocation of temporomandibular joint)   . Vertigo    Social History   Socioeconomic History  . Marital status: Single    Spouse name: Not on file  . Number of children: 2  . Years of education: Not on file  . Highest education level: Not on file  Occupational History  . Not on file  Tobacco Use  . Smoking status: Former Smoker    Packs/day: 0.00    Years: 14.00    Pack years: 0.00    Types: Cigarettes    Quit date: 03/17/2017    Years since quitting: 3.1  . Smokeless tobacco: Never Used  Vaping Use  . Vaping Use: Some days  Substance and Sexual Activity  . Alcohol use: No  . Drug use: No  . Sexual activity: Yes    Birth control/protection: Surgical  Other Topics Concern  . Not on file  Social History Narrative   Lives with  boyfriend and 3 children in a one story home.  Does not work.  Education: 9th grade. Right handed    Social Determinants of Health   Financial Resource Strain:   . Difficulty of Paying Living Expenses: Not on file  Food Insecurity:   . Worried About Charity fundraiser in the Last Year: Not on file  . Ran Out of Food in the Last Year: Not on file  Transportation Needs:   . Lack of Transportation (Medical): Not on file  . Lack of Transportation (Non-Medical): Not on file  Physical Activity:   . Days of Exercise per Week: Not on file  . Minutes of Exercise per Session: Not on file  Stress:   . Feeling of Stress : Not on file  Social Connections:   . Frequency of Communication with Friends  and Family: Not on file  . Frequency of Social Gatherings with Friends and Family: Not on file  . Attends Religious Services: Not on file  . Active Member of Clubs or Organizations: Not on file  . Attends Archivist Meetings: Not on file  . Marital Status: Not on file  Intimate Partner Violence:   . Fear of Current or Ex-Partner: Not on file  . Emotionally Abused: Not on file  . Physically Abused: Not on file  . Sexually Abused: Not on file   Family History  Problem Relation Age of Onset  . Breast cancer Paternal Grandmother   . Colon cancer Paternal Grandfather   . Colon cancer Maternal Grandfather   . Colon polyps Maternal Aunt   . Diabetes Mother   . Anesthesia problems Mother        hard to wake up post-op  . Diabetes Sister   . Anesthesia problems Sister        hard to wake up post-op  . Diabetes Father   . Heart disease Father   . Eczema Neg Hx   . Allergic rhinitis Neg Hx   . Asthma Neg Hx    Past Surgical History:  Procedure Laterality Date  . ABDOMINAL HYSTERECTOMY    . ADENOIDECTOMY, TONSILLECTOMY AND MYRINGOTOMY WITH TUBE PLACEMENT  1990's  . APPENDECTOMY    . CESAREAN SECTION  06/20/2012   Procedure: CESAREAN SECTION;  Surgeon: Emily Filbert, MD;  Location: [facility];  Service: Obstetrics;  Laterality: N/A;  . LAPAROSCOPIC APPENDECTOMY  07/25/2012   Procedure: APPENDECTOMY LAPAROSCOPIC;  Surgeon: Zenovia Jarred, MD;  Location: [facility];  Service: General;  Laterality: N/A;  . LAPAROSCOPIC TUBAL LIGATION Bilateral 08/04/2014   Procedure: BILATERAL LAPAROSCOPIC TUBAL LIGATION;  Surgeon: Woodroe Mode, MD;  Location: [facility];  Service: Gynecology;  Laterality: Bilateral;  . LIPOMA EXCISION N/A 09/16/2015   Procedure: EXCISION LIPOMA LUMBAR REGION;  Surgeon: Donnie Mesa, MD;  Location: [facility];  Service: General;  Laterality: N/A;  . TONSILLECTOMY    . WISDOM TOOTH EXTRACTION       Vanessa Kick, MD 05/08/20 [phone]

## 2020-05-20 ENCOUNTER — Encounter (HOSPITAL_COMMUNITY): Payer: Self-pay

## 2020-05-20 ENCOUNTER — Other Ambulatory Visit: Payer: Self-pay

## 2020-05-20 ENCOUNTER — Ambulatory Visit (HOSPITAL_COMMUNITY)
Admission: EM | Admit: 2020-05-20 | Discharge: 2020-05-20 | Disposition: A | Discharge status: Home / Self Care | Payer: Medicaid Other | Attending: Family Medicine | Admitting: Family Medicine

## 2020-05-20 DIAGNOSIS — R1010 Upper abdominal pain, unspecified: Secondary | ICD-10-CM | POA: Diagnosis not present

## 2020-05-20 LAB — CBC
HCT: 41.2 % (ref 36.0–46.0)
Hemoglobin: 13.9 g/dL (ref 12.0–15.0)
MCH: 31 pg (ref 26.0–34.0)
MCHC: 33.7 g/dL (ref 30.0–36.0)
MCV: 91.8 fL (ref 80.0–100.0)
Platelets: 215 10*3/uL (ref 150–400)
RBC: 4.49 MIL/uL (ref 3.87–5.11)
RDW: 11.4 % — ABNORMAL LOW (ref 11.5–15.5)
WBC: 9 10*3/uL (ref 4.0–10.5)
nRBC: 0 % (ref 0.0–0.2)

## 2020-05-20 LAB — POCT URINALYSIS DIPSTICK, ED / UC
Bilirubin Urine: NEGATIVE
Glucose, UA: NEGATIVE mg/dL
Hgb urine dipstick: NEGATIVE
Ketones, ur: NEGATIVE mg/dL
Leukocytes,Ua: NEGATIVE
Nitrite: NEGATIVE
Protein, ur: NEGATIVE mg/dL
Specific Gravity, Urine: 1.01 (ref 1.005–1.030)
Urobilinogen, UA: 0.2 mg/dL (ref 0.0–1.0)
pH: 7 (ref 5.0–8.0)

## 2020-05-20 LAB — COMPREHENSIVE METABOLIC PANEL
ALT: 23 U/L (ref 0–44)
AST: 22 U/L (ref 15–41)
Albumin: 4.1 g/dL (ref 3.5–5.0)
Alkaline Phosphatase: 56 U/L (ref 38–126)
Anion gap: 9 (ref 5–15)
BUN: 7 mg/dL (ref 6–20)
CO2: 24 mmol/L (ref 22–32)
Calcium: 9 mg/dL (ref 8.9–10.3)
Chloride: 105 mmol/L (ref 98–111)
Creatinine, Ser: 0.6 mg/dL (ref 0.44–1.00)
GFR, Estimated: >60 mL/min (ref >60)
Glucose, Bld: 102 mg/dL — ABNORMAL HIGH (ref 70–99)
Potassium: 3.8 mmol/L (ref 3.5–5.1)
Sodium: 138 mmol/L (ref 135–145)
Total Bilirubin: 0.4 mg/dL (ref 0.3–1.2)
Total Protein: 6.9 g/dL (ref 6.5–8.1)

## 2020-05-20 LAB — AMYLASE: Amylase: 68 U/L (ref 28–100)

## 2020-05-20 LAB — LIPASE, BLOOD: Lipase: 36 U/L (ref 11–51)

## 2020-05-20 MED ORDER — DICYCLOMINE HCL 20 MG PO TABS: 20.0000 mg | ORAL_TABLET | Freq: Two times a day (BID) | ORAL | 0 refills | Status: DC | PRN

## 2020-05-20 NOTE — ED Triage Notes (Signed)
Pt in with c/o generalized abdominal pain that started yesterday. States that she feels nauseous when pain comes  Not painful to palpation, Eating and drinking make pain worse  Pt took tylenol and ibuprofen with no relief  Denies vomiting, dysuria, painful urination

## 2020-05-20 NOTE — Discharge Instructions (Addendum)
You can take the zofran and bentyl together  We will call with the results  Please follow up if your symptoms worsen   Rx Miralax for constipation - start with 1/2 cap daily and may increase to 1 cap daily to achieve 1-2 soft stools per day.  Continue MIralax for ~6 months to allow the rectum to return to normal caliber.   Increase fruits, veggies, and water in diet.   Supportive cares, return precautions, and emergency procedures reviewed.

## 2020-05-20 NOTE — ED Provider Notes (Signed)
[facility]    CSN: [id number] Arrival date & time: 05/20/20  1624      History   Chief Complaint Chief Complaint  Patient presents with  . Abdominal Pain    HPI Michelle [name] is a 29 y.o. female.   She is presenting with a 2-day history of the central and upper abdominal pain.  It feels like contractions at times.  She denies any fevers or chills.  Has a history of hysterectomy.  No exposure to new or different foods.  No new or different medications.  Nonbloody stools.  Her stools have been hard.  HPI  Past Medical History:  Diagnosis Date  . Anxiety   . Asthma   . Bipolar disorder ([place])    no current med.  . Depression    no current med.  . Dermatitis 01/16/2019  . Family history of adverse reaction to anesthesia    states mother and sister are hard to wake up post-op  . Gestational diabetes   . History of seizure 06/20/2012   x 1 - during delivery of child(eclampsia)  . Hypertension   . Lipoma of lower back 08/2015  . Migraine   . Nephrolithiasis   . PTSD (post-traumatic stress disorder)   . PTSD (post-traumatic stress disorder)   . Pyelonephritis 01/16/2019  . Seizures ([place])   . Tachycardia   . TMJ (dislocation of temporomandibular joint)   . Vertigo     Patient Active Problem List   Diagnosis Date Noted  . Hip impingement syndrome, right 04/12/2020  . Right hip pain 02/03/2020  . Finger pain, right 10/23/2019  . Eustachian tube dysfunction, bilateral 12/16/2018  . Bee sting allergy 11/06/2018  . Allergic reaction 11/04/2018  . Mild intermittent asthma without complication 25/95/6387  . Chronic rhinitis 11/04/2018  . Chronic urticaria 10/04/2018  . Rash 11/16/2017  . Easy bruising 11/16/2017  . Anxiety 10/27/2016  . SUI (stress urinary incontinence, female) 04/10/2016  . Migraine 02/10/2016  . PTSD 03/02/2010  . Tobacco use disorder 02/07/2008  . 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 ABDOMINAL HYSTERECTOMY    . ADENOIDECTOMY, TONSILLECTOMY AND MYRINGOTOMY WITH TUBE PLACEMENT  1990's  . APPENDECTOMY    . CESAREAN SECTION  06/20/2012   Procedure: CESAREAN SECTION;  Surgeon: Emily Filbert, MD;  Location: [facility];  Service: Obstetrics;  Laterality: N/A;  . LAPAROSCOPIC APPENDECTOMY  07/25/2012   Procedure: APPENDECTOMY LAPAROSCOPIC;  Surgeon: Zenovia Jarred, MD;  Location: [facility];  Service: General;  Laterality: N/A;  . LAPAROSCOPIC TUBAL LIGATION Bilateral 08/04/2014   Procedure: BILATERAL LAPAROSCOPIC TUBAL LIGATION;  Surgeon: Woodroe Mode, MD;  Location: [facility];  Service: Gynecology;  Laterality: Bilateral;  . LIPOMA EXCISION N/A 09/16/2015   Procedure: EXCISION LIPOMA LUMBAR REGION;  Surgeon: Donnie Mesa, MD;  Location: [facility];  Service: General;  Laterality: N/A;  . TONSILLECTOMY    . WISDOM TOOTH EXTRACTION      OB History    Gravida  3   Para  3   Term  2   Preterm  1   AB  0   Living  3     SAB  0   TAB  0   Ectopic  0   Multiple  0   Live Births  1        Obstetric Comments  C-Section Indication: Non-reassuring fetal tracing, growth delay &  marked proteinuria & Pre-Eclampsia Eclamptic Seizure during C-section delivery         Home Medications    Prior to Admission medications   Medication Sig Start Date End Date Taking? Authorizing Provider  albuterol (VENTOLIN HFA) 108 (90 Base) MCG/ACT inhaler INHALE 2 PUFFS INTO THE LUNGS EVERY 4 HOURS AS NEEDED FOR WHEEZING OR SHORTNESS OF BREATH Patient taking differently: Inhale 2 puffs into the lungs every 4 (four) hours as needed for wheezing.  06/27/19   Myles Gip, DO  cetirizine (ZYRTEC) 10 MG tablet TAKE 2 TABLETS(20 MG) BY MOUTH TWICE DAILY Patient taking differently: Take 20 mg by mouth 2 (two) times daily.  01/02/20   Garnet Sierras, DO  dicyclomine (BENTYL) 20 MG tablet Take 1 tablet (20 mg total) by mouth 2 (two) times daily as needed for spasms.  05/20/20   Rosemarie Ax, MD  EPINEPHrine (EPIPEN 2-PAK) 0.3 mg/0.3 mL IJ SOAJ injection Inject 0.3 mLs (0.3 mg total) into the muscle once as needed for anaphylaxis. 01/23/20   Althea Charon, FNP  famotidine (PEPCID) 20 MG tablet Take 1 tablet (20 mg total) by mouth 2 (two) times daily. 01/23/20   Althea Charon, FNP  fluticasone (FLONASE) 50 MCG/ACT nasal spray Place 1 spray into both nostrils daily. Patient not taking: Reported on 04/09/2020 01/06/19   Garnet Sierras, DO  ibuprofen (ADVIL) 600 MG tablet Take 1 tablet (600 mg total) by mouth every 8 (eight) hours as needed. Patient not taking: Reported on 04/19/2020 01/16/19   Bonnita Hollow, MD  nortriptyline (PAMELOR) 25 MG capsule Take 1 capsule (25 mg total) by mouth at bedtime. 04/09/20   Cameron Sprang, MD  ondansetron (ZOFRAN ODT) 4 MG disintegrating tablet Take 1 tablet (4 mg total) by mouth every 8 (eight) hours as needed for nausea or vomiting. 04/19/20   Joy, Shawn C, PA-C  polyethylene glycol (MIRALAX / GLYCOLAX) 17 g packet Take 17 g by mouth daily as needed for mild constipation.    [provider]  tamsulosin (FLOMAX) 0.4 MG CAPS capsule Take 1 capsule (0.4 mg total) by mouth daily. 04/19/20   Joy, Helane Gunther, PA-C    Family History Family History  Problem Relation Age of Onset  . Breast cancer Paternal Grandmother   . Colon cancer Paternal Grandfather   . Colon cancer Maternal Grandfather   . Colon polyps Maternal Aunt   . Diabetes Mother   . Anesthesia problems Mother        hard to wake up post-op  . Diabetes Sister   . Anesthesia problems Sister        hard to wake up post-op  . Diabetes Father   . Heart disease Father   . Eczema Neg Hx   . Allergic rhinitis Neg Hx   . Asthma Neg Hx     Social History Social History   Tobacco Use  . Smoking status: Former Smoker    Packs/day: 0.00    Years: 14.00    Pack years: 0.00    Types: Cigarettes    Quit date: 03/17/2017    Years since quitting: 3.1  . Smokeless  tobacco: Never Used  Vaping Use  . Vaping Use: Some days  Substance Use Topics  . Alcohol use: No  . Drug use: No     Allergies   Adhesive [tape] and Bee venom   Review of Systems Review of Systems  See HPI  Physical Exam Triage Vital Signs ED Triage Vitals  Enc Vitals Group     BP 05/20/20 1738 107/76     Pulse Rate 05/20/20 1738 81     Resp 05/20/20 1738 18     Temp 05/20/20 1738 97.8 F (36.6 C)     Temp Source 05/20/20 1738 Oral     SpO2 05/20/20 1738 99 %     Weight --      Height --      Head Circumference --      Peak Flow --      Pain Score 05/20/20 1735 2     Pain Loc --      Pain Edu? --      Excl. in [place]? --    No data found.  Updated Vital Signs BP 107/76 (BP Location: Left Arm)   Pulse 81   Temp 97.8 F (36.6 C) (Oral)   Resp 18   LMP 08/14/2016 (Exact Date)   SpO2 99%   Visual Acuity Right Eye Distance:   Left Eye Distance:   Bilateral Distance:    Right Eye Near:   Left Eye Near:    Bilateral Near:     Physical Exam Gen: NAD, alert, cooperative with exam, well-appearing ENT: normal lips, normal nasal mucosa,  Eye: normal EOM, normal conjunctiva and lids  GI: no masses or tenderness, no hernia  Skin: no rashes, no areas of induration  Neuro: normal tone, normal sensation to touch Psych:  normal insight, alert and oriented   UC Treatments / Results  Labs (all labs ordered are listed, but only abnormal results are displayed) Labs Reviewed  CBC - Abnormal; Notable for the following components:      Result Value   RDW 11.4 (*)    All other components within normal limits  COMPREHENSIVE METABOLIC PANEL  LIPASE, BLOOD  AMYLASE  POCT URINALYSIS DIPSTICK, ED / UC    EKG   Radiology No results found.  Procedures Procedures (including critical care time)  Medications Ordered in UC Medications - No data to display  Initial Impression / Assessment and Plan / UC Course  I have reviewed the triage vital signs and the nursing  notes.  Pertinent labs & imaging results that were available during my care of the patient were reviewed by me and considered in my medical decision making (see chart for details).     Ms. Lemus is a 29 year old female that is presenting with central and upper quadrant abdominal pain.  No suggestion of infection.  Possible to be related to pancreatitis.  Could be associated with constipation as well.  Obtain amylase, lipase CMP and CBC.  Will provide Bentyl and has Zofran that she can take.  Given indications of follow-up.  Final Clinical Impressions(s) / UC Diagnoses   Final diagnoses:  Pain of upper abdomen     Discharge Instructions     You can take the zofran and bentyl together  We will call with the results  Please follow up if your symptoms worsen   Rx Miralax for constipation - start with 1/2 cap daily and may increase to 1 cap daily to achieve 1-2 soft stools per day.  Continue MIralax for ~6 months to allow the rectum to return to normal caliber.   Increase fruits, veggies, and water in diet.   Supportive cares, return precautions, and emergency procedures reviewed.     ED Prescriptions    Medication Sig Dispense Auth. Provider   dicyclomine (BENTYL) 20 MG tablet Take 1 tablet (20 mg total) by  mouth 2 (two) times daily as needed for spasms. 20 tablet Rosemarie Ax, MD     PDMP not reviewed this encounter.   Rosemarie Ax, MD 05/20/20 Einar Crow

## 2020-05-21 ENCOUNTER — Telehealth: Payer: Self-pay | Admitting: Family Medicine

## 2020-05-21 NOTE — Telephone Encounter (Signed)
Informed of results.   Rosemarie Ax, MD [facility] 05/21/2020, 1:32 PM

## 2020-05-24 ENCOUNTER — Ambulatory Visit: Payer: Medicaid Other

## 2020-05-24 NOTE — Progress Notes (Deleted)
    SUBJECTIVE:   CHIEF COMPLAINT / HPI:   *** She was seen in ED on 11/4 for abdominal pain Amylase, lipase, CMP, CBC, UA were negative at that time  PERTINENT  PMH / PSH: Migraine, asthma, eustachian tube dysfunction, bipolar 1 disorder, tobacco use disorder  OBJECTIVE:   LMP 08/14/2016 (Exact Date)    Physical Exam: *** General: 29 y.o. female in NAD Cardio: RRR no m/r/g Lungs: CTAB, no wheezing, no rhonchi, no crackles, no IWOB on *** Abdomen: Soft, non-tender to palpation, non-distended, positive bowel sounds Skin: warm and dry Extremities: No edema   ASSESSMENT/PLAN:   No problem-specific Assessment & Plan notes found for this encounter.     Cleophas Dunker, [place]

## 2020-06-04 ENCOUNTER — Other Ambulatory Visit: Payer: Self-pay

## 2020-06-04 ENCOUNTER — Ambulatory Visit (INDEPENDENT_AMBULATORY_CARE_PROVIDER_SITE_OTHER): Payer: Medicaid Other | Admitting: Family Medicine

## 2020-06-04 ENCOUNTER — Ambulatory Visit (HOSPITAL_BASED_OUTPATIENT_CLINIC_OR_DEPARTMENT_OTHER)
Admission: RE | Admit: 2020-06-04 | Discharge: 2020-06-04 | Disposition: A | Discharge status: Home / Self Care | Payer: Medicaid Other | Source: Ambulatory Visit | Attending: Family Medicine | Admitting: Family Medicine

## 2020-06-04 VITALS — BP 98/60 | HR 91 | Ht 62.0 in | Wt 150.5 lb

## 2020-06-04 DIAGNOSIS — I878 Other specified disorders of veins: Secondary | ICD-10-CM | POA: Diagnosis not present

## 2020-06-04 DIAGNOSIS — S73191D Other sprain of right hip, subsequent encounter: Secondary | ICD-10-CM

## 2020-06-04 DIAGNOSIS — M25551 Pain in right hip: Secondary | ICD-10-CM | POA: Diagnosis not present

## 2020-06-04 DIAGNOSIS — N6452 Nipple discharge: Secondary | ICD-10-CM | POA: Insufficient documentation

## 2020-06-04 DIAGNOSIS — K219 Gastro-esophageal reflux disease without esophagitis: Secondary | ICD-10-CM | POA: Insufficient documentation

## 2020-06-04 HISTORY — DX: Nipple discharge: N64.52

## 2020-06-04 HISTORY — DX: Gastro-esophageal reflux disease without esophagitis: K21.9

## 2020-06-04 NOTE — Patient Instructions (Signed)
Good to see you Please use ice as needed  I will call with the xray results.   Please send me a message in MyChart with any questions or updates.  We will setup a virtual visit once the MRI is resulted.   --Dr. Raeford Razor

## 2020-06-04 NOTE — Assessment & Plan Note (Signed)
Only elicited with stimulation, no e/o infection, mass or lesion. Likely physiologic. Gave return precautions.

## 2020-06-04 NOTE — Progress Notes (Signed)
    SUBJECTIVE:   CHIEF COMPLAINT / HPI: nipple discharge, abdominal pain  Nipple discharge: Patient presents with report of bilateral clear nipple discharge that occurred last night.  She reports that back in 2013 2014 she breast-fed her youngest child, however she had clamps yet and only had about an ounce of production per pumping session.  Later she quit breast-feeding due to low production and had nipple piercings done.  She occasionally had problems with nipple piercings, but no history of infection.  She reports that she remove the nipple piercings about 2 months ago.  She has not noticed any green or yellow discharge, any redness or skin changes.  Last night she was with a new partner who was stimulating her nipples when they noticed the discharge by sucking/squeezing hard.  Abdominal pain: Patient reports that she saw a physician earlier today who recommended she see GI for potential ulcer and she would like referral.  Patient reports that she has occasional abdominal cramping that is intense 7 out of 10 pain is located in her epigastrium.  She has no other symptoms associated.  It is worse after eating, she is not sure about trigger foods but can confirm that spaghetti as recently triggered it.  She recently came off famotidine for allergy control.  He has never had any other types of abdominal pain per report.  Denies N/V/D, no melena or hematochezia.  PERTINENT  PMH / PSH: Noncontributory  OBJECTIVE:  Nursing note and vital signs reviewed. BP 98/60   Pulse 91   Ht 5\' 2"  (1.575 m)   Wt 150 lb 8 oz (68.3 kg)   LMP 08/14/2016 (Exact Date)   SpO2 99%   BMI 27.53 kg/m   HEENT: Sclera without injection or icterus. MMM.  Neck: Supple.  Breast: Symmetric breasts with no palpable breast masses or obvious breast lesions. She has no retractions, small 1-6mL of clear fluid when patient presses very hard visible at nipple tip. She has no axillary abnormalities or palpable masses. Cardiac:  Regular rate and rhythm. Normal S1/S2. No murmurs, rubs, or gallops appreciated. Abdomen: Soft. Normoactive bowel sounds. No tenderness to deep or light palpation. No rebound or guarding. Neuro: AOx3 Psych: Pleasant and appropriate   ASSESSMENT/PLAN:   Nipple discharge in female Only elicited with stimulation, no e/o infection, mass or lesion. Likely physiologic. Gave return precautions.  GERD (gastroesophageal reflux disease) Patient has GERD symptoms, recently stopped famotidine with improvement of urticaria in September. Gave advice to avoid trigger foods such as citrus or tomato. Patient requests referral to GI, counseled will likely improve without requiring referral, but this is patient preference. -restart famotidine 20 mg qd -amb referral to [facility], [place]

## 2020-06-04 NOTE — Assessment & Plan Note (Signed)
Patient has GERD symptoms, recently stopped famotidine with improvement of urticaria in September. Gave advice to avoid trigger foods such as citrus or tomato. Patient requests referral to GI, counseled will likely improve without requiring referral, but this is patient preference. -restart famotidine 20 mg qd -amb referral to GI

## 2020-06-04 NOTE — Progress Notes (Signed)
Michelle [name] - 29 y.o. female MRN [id number]  Date of birth: December 15, 1990  SUBJECTIVE:  Including CC & ROS.  No chief complaint on file.   Michelle [name] is a 29 y.o. female that is presenting with acute worsening of her right hip pain.  She has tried medications as well as home exercise therapy.  Pain has been ongoing since May or June.  Is located to the proximal lateral aspect of the hip joint.  Seems localized to this area.   Review of Systems See HPI   HISTORY: Past Medical, Surgical, Social, and Family History Reviewed & Updated per EMR.   Pertinent Historical Findings include:  Past Medical History:  Diagnosis Date  . Anxiety   . Asthma   . Bipolar disorder ([place])    no current med.  . Depression    no current med.  . Dermatitis 01/16/2019  . Family history of adverse reaction to anesthesia    states mother and sister are hard to wake up post-op  . Gestational diabetes   . History of seizure 06/20/2012   x 1 - during delivery of child(eclampsia)  . Hypertension   . Lipoma of lower back 08/2015  . Migraine   . Nephrolithiasis   . PTSD (post-traumatic stress disorder)   . PTSD (post-traumatic stress disorder)   . Pyelonephritis 01/16/2019  . Seizures ([place])   . Tachycardia   . TMJ (dislocation of temporomandibular joint)   . Vertigo     Past Surgical History:  Procedure Laterality Date  . ABDOMINAL HYSTERECTOMY    . ADENOIDECTOMY, TONSILLECTOMY AND MYRINGOTOMY WITH TUBE PLACEMENT  1990's  . APPENDECTOMY    . CESAREAN SECTION  06/20/2012   Procedure: CESAREAN SECTION;  Surgeon: Emily Filbert, MD;  Location: [facility];  Service: Obstetrics;  Laterality: N/A;  . LAPAROSCOPIC APPENDECTOMY  07/25/2012   Procedure: APPENDECTOMY LAPAROSCOPIC;  Surgeon: Zenovia Jarred, MD;  Location: [facility];  Service: General;  Laterality: N/A;  . LAPAROSCOPIC TUBAL LIGATION Bilateral 08/04/2014   Procedure: BILATERAL LAPAROSCOPIC TUBAL LIGATION;  Surgeon: Woodroe Mode, MD;  Location: [facility];   Service: Gynecology;  Laterality: Bilateral;  . LIPOMA EXCISION N/A 09/16/2015   Procedure: EXCISION LIPOMA LUMBAR REGION;  Surgeon: Donnie Mesa, MD;  Location: [facility];  Service: General;  Laterality: N/A;  . TONSILLECTOMY    . WISDOM TOOTH EXTRACTION      Family History  Problem Relation Age of Onset  . Breast cancer Paternal Grandmother   . Colon cancer Paternal Grandfather   . Colon cancer Maternal Grandfather   . Colon polyps Maternal Aunt   . Diabetes Mother   . Anesthesia problems Mother        hard to wake up post-op  . Diabetes Sister   . Anesthesia problems Sister        hard to wake up post-op  . Diabetes Father   . Heart disease Father   . Eczema Neg Hx   . Allergic rhinitis Neg Hx   . Asthma Neg Hx     Social History   Socioeconomic History  . Marital status: Single    Spouse name: Not on file  . Number of children: 2  . Years of education: Not on file  . Highest education level: Not on file  Occupational History  . Not on file  Tobacco Use  . Smoking status: Former Smoker    Packs/day: 0.00    Years: 14.00    Pack  years: 0.00    Types: Cigarettes    Quit date: 03/17/2017    Years since quitting: 3.2  . Smokeless tobacco: Never Used  Vaping Use  . Vaping Use: Some days  Substance and Sexual Activity  . Alcohol use: No  . Drug use: No  . Sexual activity: Yes    Birth control/protection: Surgical  Other Topics Concern  . Not on file  Social History Narrative   Lives with boyfriend and 3 children in a one story home.  Does not work.  Education: 9th grade. Right handed    Social Determinants of Health   Financial Resource Strain:   . Difficulty of Paying Living Expenses: Not on file  Food Insecurity:   . Worried About Charity fundraiser in the Last Year: Not on file  . Ran Out of Food in the Last Year: Not on file  Transportation Needs:   . Lack of Transportation (Medical): Not on file  . Lack of Transportation (Non-Medical):  Not on file  Physical Activity:   . Days of Exercise per Week: Not on file  . Minutes of Exercise per Session: Not on file  Stress:   . Feeling of Stress : Not on file  Social Connections:   . Frequency of Communication with Friends and Family: Not on file  . Frequency of Social Gatherings with Friends and Family: Not on file  . Attends Religious Services: Not on file  . Active Member of Clubs or Organizations: Not on file  . Attends Archivist Meetings: Not on file  . Marital Status: Not on file  Intimate Partner Violence:   . Fear of Current or Ex-Partner: Not on file  . Emotionally Abused: Not on file  . Physically Abused: Not on file  . Sexually Abused: Not on file     PHYSICAL EXAM:  VS: BP (!) 100/58   Ht 5\' 2"  (1.575 m)   Wt 145 lb (65.8 kg)   LMP 08/14/2016 (Exact Date)   BMI 26.52 kg/m  Physical Exam Gen: NAD, alert, cooperative with exam, well-appearing    ASSESSMENT & PLAN:   Labral tear of right hip joint Acute worsening of pain. Occurring since May.  No improvement with pain with therapies today.  Has tried medications and home therapy.  Concern for labral issue or impingement. -Counseled on home exercise therapy and supportive care. -X-ray. -MRI to evaluate for impingement versus labral tear.

## 2020-06-04 NOTE — Assessment & Plan Note (Addendum)
Acute worsening of pain. Occurring since May.  No improvement with pain with therapies today.  Has tried medications and home therapy.  Concern for labral issue or impingement. -Counseled on home exercise therapy and supportive care. -X-ray. -MRI to evaluate for impingement versus labral tear.

## 2020-06-04 NOTE — Patient Instructions (Addendum)
It was a pleasure to see you today!  1. For the nipple discharge: this is a normal, what we call "physiologic," discharge. This can happen with stimulation. I would not be concerned unless the following symptoms develop: redness, skin changes, green or yellow discharge, large bumps, hardness like in a bruise or abscess. If you notice these symptoms, return for a follow up exam.  2. For stomach cramping, try using famotidine 20 mg once in the morning. I also recommend avoiding foods with tomatoes and citrus. It takes about 2 weeks for referrals to go through, if you have not received a call in that time frame, please call the office at [phone] and we can help facilitate that referral.   Be Well,  Dr. Chauncey Reading

## 2020-06-07 ENCOUNTER — Telehealth: Payer: Self-pay | Admitting: Family Medicine

## 2020-06-07 NOTE — Telephone Encounter (Signed)
Informed on results.   Rosemarie Ax, MD [facility] 06/07/2020, 8:47 AM

## 2020-06-08 ENCOUNTER — Telehealth: Payer: Self-pay | Admitting: Family Medicine

## 2020-06-08 NOTE — Telephone Encounter (Signed)
Faxed to [facility] form w/ supporting LOV notes & copy of pt's ins card fr MRI Fax# [phone] --glh

## 2020-06-27 ENCOUNTER — Ambulatory Visit
Admission: RE | Admit: 2020-06-27 | Discharge: 2020-06-27 | Disposition: A | Discharge status: Home / Self Care | Payer: Medicaid Other | Source: Ambulatory Visit | Attending: Family Medicine | Admitting: Family Medicine

## 2020-06-27 ENCOUNTER — Other Ambulatory Visit: Payer: Self-pay

## 2020-06-27 DIAGNOSIS — S73191D Other sprain of right hip, subsequent encounter: Secondary | ICD-10-CM

## 2020-06-27 DIAGNOSIS — M25551 Pain in right hip: Secondary | ICD-10-CM | POA: Diagnosis not present

## 2020-07-01 ENCOUNTER — Telehealth (INDEPENDENT_AMBULATORY_CARE_PROVIDER_SITE_OTHER): Payer: Medicaid Other | Admitting: Family Medicine

## 2020-07-01 ENCOUNTER — Other Ambulatory Visit: Payer: Self-pay

## 2020-07-01 DIAGNOSIS — S73191D Other sprain of right hip, subsequent encounter: Secondary | ICD-10-CM | POA: Diagnosis not present

## 2020-07-01 NOTE — Progress Notes (Signed)
Virtual Visit via Video Note  I connected with Michelle [name] on 07/01/20 at  1:10 PM EST by a video enabled telemedicine application and verified that I am speaking with the correct person using two identifiers.  Location: Patient: home Provider: office    I discussed the limitations of evaluation and management by telemedicine and the availability of in person appointments. The patient expressed understanding and agreed to proceed.  History of Present Illness:  Ms. Michelle [name] is a 29 year old female that is following up after the MRI of her right hip.  This was demonstrating a right labral tear.  She continues to have pain and it is affecting more of her daily activities.   Observations/Objective:  Gen: NAD, alert, cooperative with exam, well-appearing  Assessment and Plan:  Labral tear of right hip:  MRI was confirming the labral tear.  She continues to have worsening of her pain.  Is affecting her daily activities. -Counseled on home exercise therapy and supportive care. -Could consider physical therapy. -Referral to orthopedics.  Follow Up Instructions:    I discussed the assessment and treatment plan with the patient. The patient was provided an opportunity to ask questions and all were answered. The patient agreed with the plan and demonstrated an understanding of the instructions.   The patient was advised to call back or seek an in-person evaluation if the symptoms worsen or if the condition fails to improve as anticipated.   Clearance Coots, MD

## 2020-07-01 NOTE — Assessment & Plan Note (Signed)
MRI was confirming the labral tear.  She continues to have worsening of her pain.  Is affecting her daily activities. -Counseled on home exercise therapy and supportive care. -Could consider physical therapy. -Referral to orthopedics.

## 2020-07-02 DIAGNOSIS — N3000 Acute cystitis without hematuria: Secondary | ICD-10-CM | POA: Diagnosis not present

## 2020-07-05 DIAGNOSIS — N202 Calculus of kidney with calculus of ureter: Secondary | ICD-10-CM | POA: Diagnosis not present

## 2020-07-26 ENCOUNTER — Ambulatory Visit: Payer: Medicaid Other | Admitting: Allergy

## 2020-07-29 DIAGNOSIS — N200 Calculus of kidney: Secondary | ICD-10-CM | POA: Diagnosis not present

## 2020-07-31 DIAGNOSIS — Z20822 Contact with and (suspected) exposure to covid-19: Secondary | ICD-10-CM | POA: Diagnosis not present

## 2020-08-03 ENCOUNTER — Telehealth: Payer: Self-pay

## 2020-08-03 DIAGNOSIS — S73191D Other sprain of right hip, subsequent encounter: Secondary | ICD-10-CM | POA: Diagnosis not present

## 2020-08-03 DIAGNOSIS — M25551 Pain in right hip: Secondary | ICD-10-CM | POA: Diagnosis not present

## 2020-08-03 DIAGNOSIS — R269 Unspecified abnormalities of gait and mobility: Secondary | ICD-10-CM | POA: Diagnosis not present

## 2020-08-03 NOTE — Telephone Encounter (Signed)
Patient LVM on nurse line reporting she needs surgery on her hip. Patient states she had an apt today with ortho surgery today to discuss. The surgery should take 3-4 hours and they need a letter from PCP stating she is healthy and low risk for anesthesia complications.

## 2020-08-04 NOTE — Telephone Encounter (Signed)
Scheduled with PCP.

## 2020-08-06 ENCOUNTER — Other Ambulatory Visit: Payer: Self-pay

## 2020-08-06 ENCOUNTER — Encounter (HOSPITAL_COMMUNITY): Payer: Self-pay

## 2020-08-06 ENCOUNTER — Ambulatory Visit (HOSPITAL_COMMUNITY)
Admission: EM | Admit: 2020-08-06 | Discharge: 2020-08-06 | Disposition: A | Discharge status: Home / Self Care | Payer: Medicaid Other | Attending: Urgent Care | Admitting: Urgent Care

## 2020-08-06 DIAGNOSIS — R430 Anosmia: Secondary | ICD-10-CM

## 2020-08-06 DIAGNOSIS — R432 Parageusia: Secondary | ICD-10-CM

## 2020-08-06 DIAGNOSIS — B349 Viral infection, unspecified: Secondary | ICD-10-CM | POA: Diagnosis not present

## 2020-08-06 DIAGNOSIS — Z87891 Personal history of nicotine dependence: Secondary | ICD-10-CM | POA: Insufficient documentation

## 2020-08-06 DIAGNOSIS — J029 Acute pharyngitis, unspecified: Secondary | ICD-10-CM | POA: Diagnosis not present

## 2020-08-06 DIAGNOSIS — J45909 Unspecified asthma, uncomplicated: Secondary | ICD-10-CM | POA: Diagnosis not present

## 2020-08-06 DIAGNOSIS — U071 COVID-19: Secondary | ICD-10-CM | POA: Insufficient documentation

## 2020-08-06 DIAGNOSIS — Z79899 Other long term (current) drug therapy: Secondary | ICD-10-CM | POA: Diagnosis not present

## 2020-08-06 LAB — POCT RAPID STREP A, ED / UC: Streptococcus, Group A Screen (Direct): NEGATIVE

## 2020-08-06 LAB — SARS CORONAVIRUS 2 (TAT 6-24 HRS): SARS Coronavirus 2: POSITIVE — AB

## 2020-08-06 MED ORDER — CETIRIZINE HCL 10 MG PO TABS: 10.0000 mg | ORAL_TABLET | Freq: Every day | ORAL | 0 refills | Status: DC

## 2020-08-06 MED ORDER — PSEUDOEPHEDRINE HCL 60 MG PO TABS: 60.0000 mg | ORAL_TABLET | Freq: Three times a day (TID) | ORAL | 0 refills | Status: DC | PRN

## 2020-08-06 NOTE — ED Provider Notes (Signed)
[place]   MRN: [id number] DOB: Sep 03, 1990  Subjective:   Michelle [name] is a 30 y.o. female presenting for acute onset this morning of throat pain, left-sided neck pain and swelling.  Has also noticed that she is lost her taste and smell.  Patient is not COVID vaccinated.  Denies fever, headache, chest pain, shortness of breath, cough.  Has a history of asthma.   No current facility-administered medications for this encounter.  Current Outpatient Medications:    albuterol (VENTOLIN HFA) 108 (90 Base) MCG/ACT inhaler, INHALE 2 PUFFS INTO THE LUNGS EVERY 4 HOURS AS NEEDED FOR WHEEZING OR SHORTNESS OF BREATH (Patient taking differently: Inhale 2 puffs into the lungs every 4 (four) hours as needed for wheezing. ), Disp: 8.5 g, Rfl: 2   cetirizine (ZYRTEC) 10 MG tablet, TAKE 2 TABLETS(20 MG) BY MOUTH TWICE DAILY (Patient taking differently: Take 20 mg by mouth 2 (two) times daily. ), Disp: 120 tablet, Rfl: 2   dicyclomine (BENTYL) 20 MG tablet, Take 1 tablet (20 mg total) by mouth 2 (two) times daily as needed for spasms., Disp: 20 tablet, Rfl: 0   EPINEPHrine (EPIPEN 2-PAK) 0.3 mg/0.3 mL IJ SOAJ injection, Inject 0.3 mLs (0.3 mg total) into the muscle once as needed for anaphylaxis., Disp: 2 each, Rfl: 1   famotidine (PEPCID) 20 MG tablet, Take 1 tablet (20 mg total) by mouth 2 (two) times daily., Disp: 60 tablet, Rfl: 5   fluticasone (FLONASE) 50 MCG/ACT nasal spray, Place 1 spray into both nostrils daily. (Patient not taking: No sig reported), Disp: 15.8 mL, Rfl: 5   ibuprofen (ADVIL) 600 MG tablet, Take 1 tablet (600 mg total) by mouth every 8 (eight) hours as needed. (Patient not taking: No sig reported), Disp: 30 tablet, Rfl: 0   nortriptyline (PAMELOR) 25 MG capsule, Take 1 capsule (25 mg total) by mouth at bedtime., Disp: 30 capsule, Rfl: 6   ondansetron (ZOFRAN ODT) 4 MG disintegrating tablet, Take 1 tablet (4 mg total) by mouth every 8 (eight) hours as needed  for nausea or vomiting., Disp: 20 tablet, Rfl: 0   polyethylene glycol (MIRALAX / GLYCOLAX) 17 g packet, Take 17 g by mouth daily as needed for mild constipation., Disp: , Rfl:    Potassium Citrate 15 MEQ (1620 MG) TBCR, Take 1 tablet by mouth 2 (two) times daily., Disp: , Rfl:    tamsulosin (FLOMAX) 0.4 MG CAPS capsule, Take 1 capsule (0.4 mg total) by mouth daily., Disp: 30 capsule, Rfl: 0   Allergies  Allergen Reactions   Adhesive [Tape] Hives and Other (See Comments)    EKG or heart monitor pads- caused raised, red areas (WELTS) on the skin; "sensitive" pads fell off   Bee Venom Hives and Other (See Comments)    Welts, also    Past Medical History:  Diagnosis Date   Anxiety    Asthma    Bipolar disorder ([place])    no current med.   Depression    no current med.   Dermatitis 01/16/2019   Family history of adverse reaction to anesthesia    states mother and sister are hard to wake up post-op   Gestational diabetes    History of seizure 06/20/2012   x 1 - during delivery of child(eclampsia)   Hypertension    Lipoma of lower back 08/2015   Migraine    Nephrolithiasis    PTSD (post-traumatic stress disorder)    PTSD (post-traumatic stress disorder)  Pyelonephritis 01/16/2019   Seizures (HCC)    Tachycardia    TMJ (dislocation of temporomandibular joint)    Vertigo      Past Surgical History:  Procedure Laterality Date   ABDOMINAL HYSTERECTOMY     ADENOIDECTOMY, TONSILLECTOMY AND MYRINGOTOMY WITH TUBE PLACEMENT  1990's   APPENDECTOMY     CESAREAN SECTION  06/20/2012   Procedure: CESAREAN SECTION;  Surgeon: Emily Filbert, MD;  Location: [facility];  Service: Obstetrics;  Laterality: N/A;   LAPAROSCOPIC APPENDECTOMY  07/25/2012   Procedure: APPENDECTOMY LAPAROSCOPIC;  Surgeon: Zenovia Jarred, MD;  Location: [facility];  Service: General;  Laterality: N/A;   LAPAROSCOPIC TUBAL LIGATION Bilateral 08/04/2014   Procedure: BILATERAL LAPAROSCOPIC TUBAL LIGATION;   Surgeon: Woodroe Mode, MD;  Location: [facility];  Service: Gynecology;  Laterality: Bilateral;   LIPOMA EXCISION N/A 09/16/2015   Procedure: EXCISION LIPOMA LUMBAR REGION;  Surgeon: Donnie Mesa, MD;  Location: [facility];  Service: General;  Laterality: N/A;   TONSILLECTOMY     WISDOM TOOTH EXTRACTION      Family History  Problem Relation Age of Onset   Breast cancer Paternal Grandmother    Colon cancer Paternal Grandfather    Colon cancer Maternal Grandfather    Colon polyps Maternal Aunt    Diabetes Mother    Anesthesia problems Mother        hard to wake up post-op   Diabetes Sister    Anesthesia problems Sister        hard to wake up post-op   Diabetes Father    Heart disease Father    Eczema Neg Hx    Allergic rhinitis Neg Hx    Asthma Neg Hx     Social History   Tobacco Use   Smoking status: Former Smoker    Packs/day: 0.00    Years: 14.00    Pack years: 0.00    Types: Cigarettes    Quit date: 03/17/2017    Years since quitting: 3.3   Smokeless tobacco: Never Used  Vaping Use   Vaping Use: Some days  Substance Use Topics   Alcohol use: No   Drug use: No    ROS   Objective:   Vitals: BP 118/76 (BP Location: Right Arm)    Pulse 87    Temp 98 F (36.7 C) (Oral)    Resp 18    LMP 08/14/2016 (Exact Date)    SpO2 100%   Physical Exam Constitutional:      General: She is not in acute distress.    Appearance: Normal appearance. She is well-developed. She is not ill-appearing.  HENT:     Head: Normocephalic and atraumatic.     Nose: Nose normal.     Mouth/Throat:     Mouth: Mucous membranes are moist.     Pharynx: No oropharyngeal exudate or posterior oropharyngeal erythema.     Comments: Thick streaks of postnasal drainage overlying pharynx. Eyes:     General: No scleral icterus.       Right eye: No discharge.        Left eye: No discharge.     Extraocular Movements: Extraocular movements intact.     Pupils: Pupils  are equal, round, and reactive to light.  Cardiovascular:     Rate and Rhythm: Normal rate.  Pulmonary:     Effort: Pulmonary effort is normal.  Skin:    General: Skin is warm and dry.  Neurological:     General: No  focal deficit present.     Mental Status: She is alert and oriented to person, place, and time.  Psychiatric:        Mood and Affect: Mood normal.        Behavior: Behavior normal.     Results for orders placed or performed during the hospital encounter of 08/06/20 (from the past 24 hour(s))  POCT Rapid Strep A     Status: None   Collection Time: 08/06/20 12:22 PM  Result Value Ref Range   Streptococcus, Group A Screen (Direct) NEGATIVE NEGATIVE    Assessment and Plan :   PDMP not reviewed this encounter.  1. Viral syndrome   2. Sore throat   3. Loss of taste   4. Loss of smell     Will manage for viral illness such as viral URI, viral syndrome, viral rhinitis, COVID-19. Counseled patient on nature of COVID-19 including modes of transmission, diagnostic testing, management and supportive care.  Offered scripts for symptomatic relief.  Strep culture, COVID 19 testing is pending. Counseled patient on potential for adverse effects with medications prescribed/recommended today, ER and return-to-clinic precautions discussed, patient verbalized understanding.     Jaynee Eagles, PA-C 08/06/20 1244

## 2020-08-06 NOTE — ED Triage Notes (Signed)
Pt report swelling and pain in the left side of the throat, loss of taste and smell since this morning. Denies trouble breathing, sob, fever

## 2020-08-06 NOTE — Discharge Instructions (Signed)
We will notify you of your COVID-19 and strep culture test results as they arrive and may take between 24 to 48 hours.  I encourage you to sign up for MyChart if you have not already done so as this can be the easiest way for [place] to communicate results to you online or through a phone app.  In the meantime, if you develop worsening symptoms including fever, chest pain, shortness of breath despite our current treatment plan then please report to the emergency room as this may be a sign of worsening status from possible COVID-19 infection.  Otherwise, we will manage this as a viral syndrome. For sore throat or cough try using a honey-based tea. Use 3 teaspoons of honey with juice squeezed from half lemon. Place shaved pieces of ginger into 1/2-1 cup of water and warm over stove top. Then mix the ingredients and repeat every 4 hours as needed. Please take Tylenol 500mg -650mg  every 6 hours for aches and pains, fevers. Hydrate very well with at least 2 liters of water. Eat light meals such as soups to replenish electrolytes and soft fruits, veggies. Start an antihistamine like Zyrtec, Allegra or Claritin for postnasal drainage, sinus congestion.  You can take this together with pseudoephedrine (Sudafed) at a dose of 60 mg 2-3 times a day as needed for the same kind of congestion.

## 2020-08-09 LAB — CULTURE, GROUP A STREP (THRC)

## 2020-08-17 DIAGNOSIS — Z01818 Encounter for other preprocedural examination: Secondary | ICD-10-CM | POA: Insufficient documentation

## 2020-08-17 HISTORY — DX: Encounter for other preprocedural examination: Z01.818

## 2020-08-17 NOTE — Progress Notes (Addendum)
   Patient Name: ISHI DANSER Date of Birth: 1990-09-30 Date of Visit: 08/18/20 PCP: Daisy Floro, DO  Chief Complaint: preoperative evaluation Surgeon:  Dr. Aretha Parrot Surgery: Labral tear repair Date of Procedure: 09/08/2020  Subjective: MAKALIA BARE is a pleasant 30 y.o. with medical history significant for labral tear of right hip joint (July 2021), mild intermittent asthma without complication presenting today for preoperative evaluation.  The patient can walk 2+ flights of stairs without difficulty.    Prior surgeries: Hysterectomy, Tubal Ligation, C-section, Lipoma Removal from back under GA, T&A Difficulty with surgical procedures in the past: None History of difficulty with anesthesia: None History of venous thromboembolic disease: None History of easy bleeding with procedures: None Family history of venous thromboembolic disease: None Family history of bleeding diathesis: none  ROS Denies chest pain, dyspnea on exertion, chronic cough, history of venous thromboembolism, family history of venous thromboembolism, personal or family history of easy bleeding or bruising, or personal history or family history of difficulty with anesthesia.  I have reviewed the patient's medical, surgical, family, and social history as appropriate.   Vitals:   08/18/20 1026  BP: (!) 98/58  Pulse: 95  SpO2: 95%   Filed Weights   08/18/20 1026  Weight: 153 lb (69.4 kg)   Physical exam: General: Well-appearing patient, no apparent distress Respiratory: Comfortable work of breathing, CTA bilaterally Cardio: RRR, S1-S2 present, no murmurs appreciated Extremities: 2+ DP pulses appreciated to bilateral lower extremities, no gross deformities appreciated   Dariann was seen today for surgerical clearance.  Diagnoses and all orders for this visit:  Encounter for pre-operative examination    Patient is Low risk for a mild-moderate risk surgery.  The patient has been medically  optimized for this procedure. In addition,  I make the following recommendations for further evaluation and medication adjustments prior to their  planned procedure: None    Health maintenance: patient due for hep C screening, pap smear and COVID vaccine   Milus Banister, [place], PGY-3 08/18/2020 1:13 PM

## 2020-08-18 ENCOUNTER — Encounter: Payer: Self-pay | Admitting: Family Medicine

## 2020-08-18 ENCOUNTER — Ambulatory Visit (INDEPENDENT_AMBULATORY_CARE_PROVIDER_SITE_OTHER): Payer: Medicaid Other | Admitting: Family Medicine

## 2020-08-18 ENCOUNTER — Other Ambulatory Visit: Payer: Self-pay

## 2020-08-18 VITALS — BP 98/58 | HR 95 | Wt 153.0 lb

## 2020-08-18 DIAGNOSIS — Z01818 Encounter for other preprocedural examination: Secondary | ICD-10-CM

## 2020-08-18 NOTE — Patient Instructions (Signed)

## 2020-08-26 ENCOUNTER — Encounter: Payer: Self-pay | Admitting: Family Medicine

## 2020-08-26 ENCOUNTER — Telehealth: Payer: Self-pay

## 2020-08-26 NOTE — Telephone Encounter (Signed)
Annie Main from Dr Aretha Parrot office called to check on a medical clearance letter. Please give him a call back to advise [phone]

## 2020-08-26 NOTE — Telephone Encounter (Signed)
Patient and Annie Main from Dr. Aretha Parrot office LVM on nurse line regarding surgical clearance form.   I am unable to find any documents in media or letters tab.   Please advise   Talbot Grumbling, RN

## 2020-08-27 NOTE — Telephone Encounter (Signed)
Copies have been made, faxed and placed in has been faxed box. Salvatore Marvel, CMA

## 2020-08-27 NOTE — Telephone Encounter (Signed)
Spoke with pt informed that paperwork has been faxed. Pt understood. Pt also mentioned about her daughters face cream for acne. She stated that the pharmacy said that the Rx that was sent in the insurance does not cover that. And would need for the provider to contact the insurance company or to write a Rx that is covered by insurance. Salvatore Marvel, CMA

## 2020-08-27 NOTE — Telephone Encounter (Signed)
Routed message to PCP. Raihana Balderrama, CMA  

## 2020-09-07 DIAGNOSIS — R262 Difficulty in walking, not elsewhere classified: Secondary | ICD-10-CM | POA: Diagnosis not present

## 2020-09-07 DIAGNOSIS — Z789 Other specified health status: Secondary | ICD-10-CM | POA: Diagnosis not present

## 2020-09-07 DIAGNOSIS — M25651 Stiffness of right hip, not elsewhere classified: Secondary | ICD-10-CM | POA: Diagnosis not present

## 2020-09-07 DIAGNOSIS — M6281 Muscle weakness (generalized): Secondary | ICD-10-CM | POA: Diagnosis not present

## 2020-09-07 DIAGNOSIS — M25551 Pain in right hip: Secondary | ICD-10-CM | POA: Diagnosis not present

## 2020-09-07 DIAGNOSIS — R6889 Other general symptoms and signs: Secondary | ICD-10-CM | POA: Diagnosis not present

## 2020-09-07 DIAGNOSIS — Z7409 Other reduced mobility: Secondary | ICD-10-CM | POA: Diagnosis not present

## 2020-09-08 DIAGNOSIS — M24851 Other specific joint derangements of right hip, not elsewhere classified: Secondary | ICD-10-CM | POA: Diagnosis not present

## 2020-09-08 DIAGNOSIS — S73191A Other sprain of right hip, initial encounter: Secondary | ICD-10-CM | POA: Diagnosis not present

## 2020-09-08 DIAGNOSIS — M65851 Other synovitis and tenosynovitis, right thigh: Secondary | ICD-10-CM | POA: Diagnosis not present

## 2020-09-08 DIAGNOSIS — M94251 Chondromalacia, right hip: Secondary | ICD-10-CM | POA: Diagnosis not present

## 2020-09-08 DIAGNOSIS — M25551 Pain in right hip: Secondary | ICD-10-CM | POA: Diagnosis not present

## 2020-09-08 DIAGNOSIS — M25851 Other specified joint disorders, right hip: Secondary | ICD-10-CM | POA: Diagnosis not present

## 2020-09-10 DIAGNOSIS — R262 Difficulty in walking, not elsewhere classified: Secondary | ICD-10-CM | POA: Diagnosis not present

## 2020-09-10 DIAGNOSIS — M25651 Stiffness of right hip, not elsewhere classified: Secondary | ICD-10-CM | POA: Diagnosis not present

## 2020-09-10 DIAGNOSIS — M25551 Pain in right hip: Secondary | ICD-10-CM | POA: Diagnosis not present

## 2020-09-10 DIAGNOSIS — Z7409 Other reduced mobility: Secondary | ICD-10-CM | POA: Diagnosis not present

## 2020-09-10 DIAGNOSIS — Z9889 Other specified postprocedural states: Secondary | ICD-10-CM | POA: Diagnosis not present

## 2020-09-10 DIAGNOSIS — R29898 Other symptoms and signs involving the musculoskeletal system: Secondary | ICD-10-CM | POA: Diagnosis not present

## 2020-09-13 DIAGNOSIS — R29898 Other symptoms and signs involving the musculoskeletal system: Secondary | ICD-10-CM | POA: Diagnosis not present

## 2020-09-13 DIAGNOSIS — Z9889 Other specified postprocedural states: Secondary | ICD-10-CM | POA: Diagnosis not present

## 2020-09-13 DIAGNOSIS — M25551 Pain in right hip: Secondary | ICD-10-CM | POA: Diagnosis not present

## 2020-09-13 DIAGNOSIS — M25651 Stiffness of right hip, not elsewhere classified: Secondary | ICD-10-CM | POA: Diagnosis not present

## 2020-09-13 DIAGNOSIS — Z7409 Other reduced mobility: Secondary | ICD-10-CM | POA: Diagnosis not present

## 2020-09-13 DIAGNOSIS — R262 Difficulty in walking, not elsewhere classified: Secondary | ICD-10-CM | POA: Diagnosis not present

## 2020-09-15 DIAGNOSIS — Z9889 Other specified postprocedural states: Secondary | ICD-10-CM | POA: Diagnosis not present

## 2020-09-15 DIAGNOSIS — R29898 Other symptoms and signs involving the musculoskeletal system: Secondary | ICD-10-CM | POA: Diagnosis not present

## 2020-09-15 DIAGNOSIS — M25651 Stiffness of right hip, not elsewhere classified: Secondary | ICD-10-CM | POA: Diagnosis not present

## 2020-09-15 DIAGNOSIS — Z7409 Other reduced mobility: Secondary | ICD-10-CM | POA: Diagnosis not present

## 2020-09-15 DIAGNOSIS — M25551 Pain in right hip: Secondary | ICD-10-CM | POA: Diagnosis not present

## 2020-09-20 DIAGNOSIS — Z9889 Other specified postprocedural states: Secondary | ICD-10-CM | POA: Diagnosis not present

## 2020-09-20 DIAGNOSIS — R29898 Other symptoms and signs involving the musculoskeletal system: Secondary | ICD-10-CM | POA: Diagnosis not present

## 2020-09-20 DIAGNOSIS — M25651 Stiffness of right hip, not elsewhere classified: Secondary | ICD-10-CM | POA: Diagnosis not present

## 2020-09-20 DIAGNOSIS — Z7409 Other reduced mobility: Secondary | ICD-10-CM | POA: Diagnosis not present

## 2020-09-22 DIAGNOSIS — Z9889 Other specified postprocedural states: Secondary | ICD-10-CM | POA: Diagnosis not present

## 2020-09-22 DIAGNOSIS — R29898 Other symptoms and signs involving the musculoskeletal system: Secondary | ICD-10-CM | POA: Diagnosis not present

## 2020-09-22 DIAGNOSIS — Z7409 Other reduced mobility: Secondary | ICD-10-CM | POA: Diagnosis not present

## 2020-09-22 DIAGNOSIS — M25551 Pain in right hip: Secondary | ICD-10-CM | POA: Diagnosis not present

## 2020-09-22 DIAGNOSIS — M25651 Stiffness of right hip, not elsewhere classified: Secondary | ICD-10-CM | POA: Diagnosis not present

## 2020-09-27 NOTE — Progress Notes (Deleted)
    SUBJECTIVE:   CHIEF COMPLAINT / HPI:   Hip surgery: ***  PERTINENT  PMH / PSH:  Patient Active Problem List   Diagnosis Date Noted  . Encounter for pre-operative examination 08/17/2020  . Nipple discharge in female 06/04/2020  . GERD (gastroesophageal reflux disease) 06/04/2020  . Labral tear of right hip joint 04/12/2020  . Right hip pain 02/03/2020  . Finger pain, right 10/23/2019  . Eustachian tube dysfunction, bilateral 12/16/2018  . Bee sting allergy 11/06/2018  . Allergic reaction 11/04/2018  . Mild intermittent asthma without complication 40/34/7425  . Chronic rhinitis 11/04/2018  . Chronic urticaria 10/04/2018  . Rash 11/16/2017  . Easy bruising 11/16/2017  . Anxiety 10/27/2016  . SUI (stress urinary incontinence, female) 04/10/2016  . Migraine 02/10/2016  . PTSD 03/02/2010  . Tobacco use disorder 02/07/2008  . 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     Daisy Floro, [place]   {    This will disappear when note is signed, click to select method of visit    :1}

## 2020-09-28 ENCOUNTER — Ambulatory Visit: Payer: Medicaid Other | Admitting: Family Medicine

## 2020-10-04 ENCOUNTER — Encounter: Payer: Self-pay | Admitting: Family Medicine

## 2020-10-04 ENCOUNTER — Ambulatory Visit (INDEPENDENT_AMBULATORY_CARE_PROVIDER_SITE_OTHER): Payer: Medicaid Other | Admitting: Family Medicine

## 2020-10-04 ENCOUNTER — Other Ambulatory Visit: Payer: Self-pay

## 2020-10-04 VITALS — BP 123/60 | HR 129 | Ht 62.0 in | Wt 157.6 lb

## 2020-10-04 DIAGNOSIS — M2480 Other specific joint derangements of unspecified joint, not elsewhere classified: Secondary | ICD-10-CM | POA: Diagnosis not present

## 2020-10-04 DIAGNOSIS — M25551 Pain in right hip: Secondary | ICD-10-CM | POA: Diagnosis not present

## 2020-10-04 DIAGNOSIS — M357 Hypermobility syndrome: Secondary | ICD-10-CM | POA: Insufficient documentation

## 2020-10-04 DIAGNOSIS — Z9889 Other specified postprocedural states: Secondary | ICD-10-CM | POA: Diagnosis not present

## 2020-10-04 DIAGNOSIS — M25651 Stiffness of right hip, not elsewhere classified: Secondary | ICD-10-CM | POA: Diagnosis not present

## 2020-10-04 DIAGNOSIS — R29898 Other symptoms and signs involving the musculoskeletal system: Secondary | ICD-10-CM | POA: Diagnosis not present

## 2020-10-04 DIAGNOSIS — Z7409 Other reduced mobility: Secondary | ICD-10-CM | POA: Diagnosis not present

## 2020-10-04 NOTE — Progress Notes (Signed)
    SUBJECTIVE:   CHIEF COMPLAINT / HPI:   Follow-up, hip surgery: Patient had right hip arthroplasty 09/08/2020.  Since then she has been doing physical therapy, her physical therapist recommended she follow-up with [place] today due to concerns for hypermobility.  Patient reports that when she is being stretched out at physical therapy and it was remarked of how hypermobile she is.  Patient did some research online regarding Ehlers-Danlos, the Beighton score, and other concerning signs/symptoms.  She presents to clinic asking for a work-up to be evaluated for Ehlers-Danlos.  Patient reports history of migraines, history of GI issues (constipation and diarrhea that are chronic).  Patient denies significant joint pain or frequent dislocations.  However, patient did tear labrum of right hip without significant accident or trauma.  PERTINENT  PMH / PSH:  PTSD, bipolar 1 disorder, mild intermittent asthma, chronic right hip pain secondary to labral tear, GERD   OBJECTIVE:   BP 123/60   Pulse (!) 129   Ht 5\' 2"  (1.575 m)   Wt 157 lb 9.6 oz (71.5 kg)   LMP 08/14/2016 (Exact Date)   SpO2 99%   BMI 28.83 kg/m    Physical exam: General: Well-appearing patient, nontoxic-appearing Respiratory: Comfortable work of breathing on room air Integumentary: Velvety skin appreciated, did not assess elasticity  Beighton score: 8 (Bilateral thumbs, bilateral pinkies, 1 elbow, bilateral knees, able to touch floor with straight knees and arms)  ASSESSMENT/PLAN:   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  Patient denies significant joint aches/pains characteristics of EDS. -Patient to return to sports medicine physician for further evaluation/work-up of hypermobility/Ehlers-Danlos syndrome     [place]

## 2020-10-04 NOTE — Assessment & Plan Note (Signed)
Patient with Beighton score of 8, other concerning signs of hypermobility.  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  Patient denies significant joint aches/pains characteristics of EDS. -Patient to return to sports medicine physician for further evaluation/work-up of hypermobility/Ehlers-Danlos syndrome

## 2020-10-04 NOTE — Patient Instructions (Signed)
Thank you for coming in to see Michelle [name] today!   Please call the clinic at [phone] if your symptoms worsen or you have any concerns. It was our pleasure to serve you!   Dr. Milus Banister [facility]    Ehlers-Danlos Syndrome Ehlers-Danlos syndrome (EDS) is a group of genetic disorders that affect the body's connective tissues, which are made up of proteins. Connective tissues give support and elasticity to your skin, bones, joints, blood vessels, and internal organs. EDS causes connective tissues to become weak, fragile, and stretchable. People with EDS often have overly flexible joints and stretchy, fragile skin. There are 13 types of EDS, but many of those are rare. The most common types affect joints and skin. Other types affect the eyes, spine, or heart valves. The most severe types affect the walls of blood vessels and the structures of internal organs. What are the causes? This condition is caused by abnormal genes (genetic defects). Some types are passed down from parent to child (inherited) and others are not. Sometimes, the genes can become defective during development in the womb. What are the signs or symptoms? The signs and symptoms of EDS depend on the type. Signs and symptoms may start in childhood or later in life. Signs and symptoms of the common types include: Joint and muscle symptoms  Overly flexible joints that have a large range of motion and can dislocate easily.  Joint pain.  Weak, floppy muscles. Skin symptoms  Velvety, soft skin that stretches and sags.  Skin that bruises and tears easily.  Skin tears that are hard to repair with stitches (sutures) and heal with wide scars.  Lumps under the skin. Vision symptoms  Rupture of the eyeball or tearing of the tissue inside the eye (retinal detachment).  Changes in vision, including seeing floaters, flashes, or having sudden loss of vision. Vascular symptoms  Bleeding from tears or ruptures  of internal organs or blood vessels.  Weak heart valves causing weakening of the heart. Orthopedic symptoms  Curved spine, which can cause difficulty breathing.  Bowed limbs and short stature.  Muscle or tendon tears.  Weak, thin bones.  Club foot. Other symptoms  Collapsed lung.  Unusual facial features, such as a thin nose and lips, sunken cheeks, small ear lobes, and prominent eyes. How is this diagnosed? Your health care provider may suspect EDS based on your symptoms and medical history, especially if EDS runs in your family. Your health care provider will also do a physical exam. You may also have diagnostic tests including:  Blood tests to check for genetic defects.  Tissue tests to check for abnormal body proteins (collagen).  X-rays of the spine.  Imaging studies of internal organs (MRI or CT scan).  Bone scan.  Heart ultrasound (echocardiogram) to look for heart valve problems. How is this treated? There is no cure for this condition, but treatment can help symptoms and prevent complications. Treatment depends on the type of EDS and the symptoms it causes. Treatment options can include:  Over-the-counter pain medicines.  Vitamin C to strengthen skin.  Vitamin D and calcium to strengthen bones.  Blood pressure medicines to prevent blood vessel tears or ruptures.  Physical therapy to strengthen muscles and joints.  Assistive devices that help you move, like braces or a wheelchair, walker, or scooter.  Surgery to repair wounds, tears, joints, or ruptures. Follow these instructions at home:  Take over-the-counter and prescription medicines only as told by your health care provider.  Return to your normal activities as told by your health care provider. Ask your health care provider what activities are safe for you. It may be important to avoid contact sports and heavy lifting.  Let your health care provider know if you become pregnant. You will need  special care to prevent rupture of the womb.  Let all health care providers know that you have EDS before any surgery or procedure.  Wear a medical alert bracelet.  Schedule eye exams often.  Consider having genetic counseling to learn if you may pass EDS on to your children.   Contact a health care provider if:  You have joint pain.  You cut or tear your skin.  You have weakness or shortness of breath.  You have any new symptoms. Get help right away if you have:  Any sudden and severe pain.  Trouble breathing.  Chest pain.  Floaters or flashes in your vision or sudden loss of vision. Summary  Ehlers-Danlos syndrome (EDS) is a group of genetic disorders that affect the connective tissues of the body.  EDS is caused by abnormal genes (genetic defects).  The effects of EDS can range from having overly flexible joints and fragile skin to very serious complications, such as blood vessel or organ ruptures.  There is no cure for this condition, but treatment can relieve some symptoms and prevent complications. This information is not intended to replace advice given to you by your health care provider. Make sure you discuss any questions you have with your health care provider. Document Revised: 02/03/2019 Document Reviewed: 02/03/2019 Elsevier Patient Education  [place].

## 2020-10-05 ENCOUNTER — Other Ambulatory Visit: Payer: Self-pay

## 2020-10-05 ENCOUNTER — Telehealth: Payer: Self-pay

## 2020-10-05 ENCOUNTER — Ambulatory Visit (HOSPITAL_COMMUNITY)
Admission: EM | Admit: 2020-10-05 | Discharge: 2020-10-05 | Disposition: A | Discharge status: Home / Self Care | Payer: Medicaid Other

## 2020-10-05 NOTE — Telephone Encounter (Signed)
Patient calls nurse line regarding photosensitivity related to being on doxycycline. Patient has been taking doxycycline at instruction of orthopedic surgeon since hip surgery on 2/23. Patient reports that she was outside today for approx one hour. After coming back inside, patient began to notice "burning sensation" under skin. Patient declines redness of skin or skin being warm to the touch.   Spoke with Dr. McDiarmid regarding patient. Recommended treatment with pain reliever and aloe vera. Patient is already taking naproxen per orthopedic surgeon. Advised that patient could take tylenol as needed for discomfort and apply aloe vera. Patient verbalizes understanding.   Strict return precautions given.   Talbot Grumbling, RN

## 2020-10-06 DIAGNOSIS — M25651 Stiffness of right hip, not elsewhere classified: Secondary | ICD-10-CM | POA: Diagnosis not present

## 2020-10-06 DIAGNOSIS — M25551 Pain in right hip: Secondary | ICD-10-CM | POA: Diagnosis not present

## 2020-10-06 DIAGNOSIS — Z7409 Other reduced mobility: Secondary | ICD-10-CM | POA: Diagnosis not present

## 2020-10-06 DIAGNOSIS — Z9889 Other specified postprocedural states: Secondary | ICD-10-CM | POA: Diagnosis not present

## 2020-10-06 DIAGNOSIS — R29898 Other symptoms and signs involving the musculoskeletal system: Secondary | ICD-10-CM | POA: Diagnosis not present

## 2020-10-08 ENCOUNTER — Encounter: Payer: Self-pay | Admitting: Family Medicine

## 2020-10-08 ENCOUNTER — Other Ambulatory Visit: Payer: Self-pay

## 2020-10-08 ENCOUNTER — Ambulatory Visit (INDEPENDENT_AMBULATORY_CARE_PROVIDER_SITE_OTHER): Payer: Medicaid Other | Admitting: Family Medicine

## 2020-10-08 VITALS — BP 112/82 | Ht 62.0 in | Wt 157.0 lb

## 2020-10-08 DIAGNOSIS — M357 Hypermobility syndrome: Secondary | ICD-10-CM | POA: Diagnosis not present

## 2020-10-08 NOTE — Assessment & Plan Note (Signed)
Has an exam that is consistent with hypermobility.  She does have changes in her vision and has a routine eye exam.  Has not had cardiac evaluation yet.  Does have a history of tachycardia. -Counseled on supportive care

## 2020-10-08 NOTE — Patient Instructions (Signed)
Good to see you Check out [email or web address]   Please send me a message in MyChart with any questions or updates.  Please see [place] back as needed.   --Dr. Raeford Razor

## 2020-10-08 NOTE — Progress Notes (Signed)
Michelle [name] - 30 y.o. female MRN [id number]  Date of birth: 08/04/1990  SUBJECTIVE:  Including CC & ROS.  No chief complaint on file.   Michelle [name] is a 30 y.o. female that is presenting with questions about hypermobility.  She reports having history of migraines, changes in her vision as well as tachycardia.  She has had multiple joints of pain.   Review of Systems See HPI   HISTORY: Past Medical, Surgical, Social, and Family History Reviewed & Updated per EMR.   Pertinent Historical Findings include:  Past Medical History:  Diagnosis Date   Anxiety    Asthma    Bipolar disorder ([place])    no current med.   Depression    no current med.   Dermatitis 01/16/2019   Family history of adverse reaction to anesthesia    states mother and sister are hard to wake up post-op   Gestational diabetes    History of seizure 06/20/2012   x 1 - during delivery of child(eclampsia)   Hypertension    Lipoma of lower back 08/2015   Migraine    Nephrolithiasis    PTSD (post-traumatic stress disorder)    PTSD (post-traumatic stress disorder)    Pyelonephritis 01/16/2019   Seizures ([facility])    Tachycardia    TMJ (dislocation of temporomandibular joint)    Vertigo     Past Surgical History:  Procedure Laterality Date   ABDOMINAL HYSTERECTOMY     ADENOIDECTOMY, TONSILLECTOMY AND MYRINGOTOMY WITH TUBE PLACEMENT  1990's   APPENDECTOMY     CESAREAN SECTION  06/20/2012   Procedure: CESAREAN SECTION;  Surgeon: Emily Filbert, MD;  Location: [facility];  Service: Obstetrics;  Laterality: N/A;   LAPAROSCOPIC APPENDECTOMY  07/25/2012   Procedure: APPENDECTOMY LAPAROSCOPIC;  Surgeon: Zenovia Jarred, MD;  Location: [facility];  Service: General;  Laterality: N/A;   LAPAROSCOPIC TUBAL LIGATION Bilateral 08/04/2014   Procedure: BILATERAL LAPAROSCOPIC TUBAL LIGATION;  Surgeon: Woodroe Mode, MD;  Location: [facility];  Service: Gynecology;  Laterality: Bilateral;   LIPOMA EXCISION N/A 09/16/2015    Procedure: EXCISION LIPOMA LUMBAR REGION;  Surgeon: Donnie Mesa, MD;  Location: [facility];  Service: General;  Laterality: N/A;   TONSILLECTOMY     WISDOM TOOTH EXTRACTION      Family History  Problem Relation Age of Onset   Breast cancer Paternal Grandmother    Colon cancer Paternal Grandfather    Colon cancer Maternal Grandfather    Colon polyps Maternal Aunt    Diabetes Mother    Anesthesia problems Mother        hard to wake up post-op   Diabetes Sister    Anesthesia problems Sister        hard to wake up post-op   Diabetes Father    Heart disease Father    Eczema Neg Hx    Allergic rhinitis Neg Hx    Asthma Neg Hx     Social History   Socioeconomic History   Marital status: Single    Spouse name: Not on file   Number of children: 2   Years of education: Not on file   Highest education level: Not on file  Occupational History   Not on file  Tobacco Use   Smoking status: Former Smoker    Packs/day: 0.00    Years: 14.00    Pack years: 0.00    Types: Cigarettes    Quit date: 03/17/2017    Years since quitting:  3.5   Smokeless tobacco: Never Used  Vaping Use   Vaping Use: Some days  Substance and Sexual Activity   Alcohol use: No   Drug use: No   Sexual activity: Yes    Birth control/protection: Surgical  Other Topics Concern   Not on file  Social History Narrative   Lives with boyfriend and 3 children in a one story home.  Does not work.  Education: 9th grade. Right handed    Social Determinants of Health   Financial Resource Strain: Not on file  Food Insecurity: Not on file  Transportation Needs: Not on file  Physical Activity: Not on file  Stress: Not on file  Social Connections: Not on file  Intimate Partner Violence: Not on file     PHYSICAL EXAM:  VS: BP 112/82 (BP Location: Left Arm, Patient Position: Sitting, Cuff Size: Normal)    Ht 5\' 2"  (1.575 m)    Wt 157 lb (71.2 kg)    LMP 08/14/2016 (Exact  Date)    BMI 28.72 kg/m  Physical Exam Gen: NAD, alert, cooperative with exam, well-appearing MSK:  Able to touch each thumb to forearm. Able to hyperextend elbows. Can take pinky fingers past 90 degrees. Can place hands flat on floor. 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  She does have changes in her vision and has a routine eye exam.  Has not had cardiac evaluation yet.  Does have a history of tachycardia. -Counseled on supportive care

## 2020-10-11 DIAGNOSIS — M25551 Pain in right hip: Secondary | ICD-10-CM | POA: Diagnosis not present

## 2020-10-11 DIAGNOSIS — R29898 Other symptoms and signs involving the musculoskeletal system: Secondary | ICD-10-CM | POA: Diagnosis not present

## 2020-10-11 DIAGNOSIS — Z7409 Other reduced mobility: Secondary | ICD-10-CM | POA: Diagnosis not present

## 2020-10-11 DIAGNOSIS — Z9889 Other specified postprocedural states: Secondary | ICD-10-CM | POA: Diagnosis not present

## 2020-10-12 DIAGNOSIS — M25551 Pain in right hip: Secondary | ICD-10-CM | POA: Diagnosis not present

## 2020-10-13 DIAGNOSIS — R29898 Other symptoms and signs involving the musculoskeletal system: Secondary | ICD-10-CM | POA: Diagnosis not present

## 2020-10-13 DIAGNOSIS — Z9889 Other specified postprocedural states: Secondary | ICD-10-CM | POA: Diagnosis not present

## 2020-10-13 DIAGNOSIS — M25551 Pain in right hip: Secondary | ICD-10-CM | POA: Diagnosis not present

## 2020-10-13 DIAGNOSIS — Z7409 Other reduced mobility: Secondary | ICD-10-CM | POA: Diagnosis not present

## 2020-10-14 ENCOUNTER — Telehealth: Payer: Self-pay | Admitting: *Deleted

## 2020-10-14 NOTE — Telephone Encounter (Signed)
Patient states that her orthopaedic surgeon told her that she needs to take some calcium and vitamin D supplements because she has "weak bones".  She wants to know if PCP can do some lab work and find the best dosage for her.  To PCP. Christen Bame, CMA

## 2020-10-18 DIAGNOSIS — M25551 Pain in right hip: Secondary | ICD-10-CM | POA: Diagnosis not present

## 2020-10-18 DIAGNOSIS — Z7409 Other reduced mobility: Secondary | ICD-10-CM | POA: Diagnosis not present

## 2020-10-18 DIAGNOSIS — Z9889 Other specified postprocedural states: Secondary | ICD-10-CM | POA: Diagnosis not present

## 2020-10-18 DIAGNOSIS — R29898 Other symptoms and signs involving the musculoskeletal system: Secondary | ICD-10-CM | POA: Diagnosis not present

## 2020-10-21 ENCOUNTER — Other Ambulatory Visit: Payer: Self-pay

## 2020-10-21 ENCOUNTER — Ambulatory Visit: Payer: Medicaid Other | Admitting: Neurology

## 2020-10-21 ENCOUNTER — Encounter: Payer: Self-pay | Admitting: Neurology

## 2020-10-21 VITALS — BP 113/78 | HR 93 | Resp 18 | Ht 62.0 in | Wt 157.0 lb

## 2020-10-21 DIAGNOSIS — R29898 Other symptoms and signs involving the musculoskeletal system: Secondary | ICD-10-CM | POA: Diagnosis not present

## 2020-10-21 DIAGNOSIS — G43009 Migraine without aura, not intractable, without status migrainosus: Secondary | ICD-10-CM | POA: Diagnosis not present

## 2020-10-21 DIAGNOSIS — Z7409 Other reduced mobility: Secondary | ICD-10-CM | POA: Diagnosis not present

## 2020-10-21 DIAGNOSIS — Z9889 Other specified postprocedural states: Secondary | ICD-10-CM | POA: Diagnosis not present

## 2020-10-21 DIAGNOSIS — M25551 Pain in right hip: Secondary | ICD-10-CM | POA: Diagnosis not present

## 2020-10-21 NOTE — Progress Notes (Signed)
NEUROLOGY FOLLOW UP OFFICE NOTE  Michelle [name] [id number] 1990-11-22  HISTORY OF PRESENT ILLNESS: I had the pleasure of seeing Michelle [name] in follow-up in the neurology clinic on 10/21/2020.  The patient was last seen 6 months ago for migraines. She had initial good response to nortriptyline and was able to wean off medication. On her last visit, she had a mild concussion in 03/2020 and had an increase in headaches. We had discussed restarting nortriptyline if headaches did not resolve with NSAIDs. She reports overall doing well, she did not need to start nortriptyline. She denies any migraines recently,she states the last migraine she had was when she needed the migraine cocktail in June 2021. She denies any dizziness, vision changes, focal numbness/tingling/weakness, no falls. She has been diagnosed with hypermobility syndrome and sees Ortho and physical therapy.    History on Initial Assessment 03/06/2016: This is a 30yo RH woman with a history of bipolar disorder, PTSD, hypertension, who presented with new onset headaches since June 2016. 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 They were so bad that she was shaking on the floor and felt like she would pass out. She went to the ER on 01/06/16 and reports having daily headaches that wax and wane in intensity since then. She would wake up with a headache then towards the evening they become more severe 10/10 with throbbing pain and nausea. She occasionally sees black dots in her vision. No positional component. She stopped Depo-Provera last week. No photo/phonophobia, tinnitus, focal numbness/tingling/weakness, dizziness, diplopia, dysarthria, dysphagia, bowel/bladder dysfunction. She has had neck pains as well. She has chronic back pain.   She has been dealing with significant stomach  cramps and will be seeing a specialist regarding concern for endometriosis. She has been taking Tramadol and Percocet daily for the cramps, and has prn Fioricet for the headaches, taking 2-3 daily for the past 2 months. She gets 8-9 hours of sleep but still feels exhausted (for the past couple of years). She denies snoring, she has daytime drowsiness. She was given Reglan as well in the ER and takes this daily. There is a history of one seizure during delivery of her third child in 2013. No family history of migraines.    PAST MEDICAL HISTORY: Past Medical History:  Diagnosis Date  . Anxiety   . Asthma   . Bipolar disorder ([place])    no current med.  . Depression    no current med.  . Dermatitis 01/16/2019  . Family history of adverse reaction to anesthesia    states mother and sister are hard to wake up post-op  . Gestational diabetes   . History of seizure 06/20/2012   x 1 - during delivery of child(eclampsia)  . Hypertension   . Lipoma of lower back 08/2015  . Migraine   . Nephrolithiasis   . PTSD (post-traumatic stress disorder)   . PTSD (post-traumatic stress disorder)   . Pyelonephritis 01/16/2019  . Seizures ([place])   . Tachycardia   . TMJ (dislocation of temporomandibular joint)   . Vertigo     MEDICATIONS: Current Outpatient Medications on File Prior to Visit  Medication Sig Dispense Refill  . albuterol (VENTOLIN HFA) 108 (90 Base) MCG/ACT inhaler INHALE 2 PUFFS INTO THE LUNGS EVERY 4 HOURS AS NEEDED FOR WHEEZING OR SHORTNESS OF BREATH (Patient taking differently:  Inhale 2 puffs into the lungs every 4 (four) hours as needed for wheezing.) 8.5 g 2  . cetirizine (ZYRTEC ALLERGY) 10 MG tablet Take 1 tablet (10 mg total) by mouth daily. 30 tablet 0  . Multiple Vitamin (MULTIVITAMIN ADULT PO) Take by mouth.    . dicyclomine (BENTYL) 20 MG tablet Take 1 tablet (20 mg total) by mouth 2 (two) times daily as needed for spasms. 20 tablet 0  . doxycycline (VIBRAMYCIN) 100 MG capsule Take by  mouth in the morning and at bedtime. (Patient not taking: Reported on 10/21/2020)    . EPINEPHrine (EPIPEN 2-PAK) 0.3 mg/0.3 mL IJ SOAJ injection Inject 0.3 mLs (0.3 mg total) into the muscle once as needed for anaphylaxis. (Patient not taking: Reported on 10/21/2020) 2 each 1  . famotidine (PEPCID) 20 MG tablet Take 1 tablet (20 mg total) by mouth 2 (two) times daily. (Patient not taking: Reported on 10/21/2020) 60 tablet 5  . naproxen (NAPROSYN) 500 MG tablet Take by mouth. (Patient not taking: Reported on 10/21/2020)    . ondansetron (ZOFRAN ODT) 4 MG disintegrating tablet Take 1 tablet (4 mg total) by mouth every 8 (eight) hours as needed for nausea or vomiting. 20 tablet 0  . polyethylene glycol (MIRALAX / GLYCOLAX) 17 g packet Take 17 g by mouth daily as needed for mild constipation. (Patient not taking: Reported on 10/21/2020)    . Potassium Citrate 15 MEQ (1620 MG) TBCR Take 1 tablet by mouth 2 (two) times daily. (Patient not taking: Reported on 10/21/2020)    . pseudoephedrine (SUDAFED) 60 MG tablet Take 1 tablet (60 mg total) by mouth every 8 (eight) hours as needed for congestion. (Patient not taking: Reported on 10/21/2020) 30 tablet 0  . tamsulosin (FLOMAX) 0.4 MG CAPS capsule Take 1 capsule (0.4 mg total) by mouth daily. (Patient not taking: Reported on 10/21/2020) 30 capsule 0  . [DISCONTINUED] fluticasone (FLONASE) 50 MCG/ACT nasal spray Place 1 spray into both nostrils daily. (Patient not taking: No sig reported) 15.8 mL 5  . [DISCONTINUED] nortriptyline (PAMELOR) 25 MG capsule Take 1 capsule (25 mg total) by mouth at bedtime. 30 capsule 6   No current facility-administered medications on file prior to visit.    ALLERGIES: Allergies  Allergen Reactions  . Adhesive [Tape] Hives and Other (See Comments)    EKG or heart monitor pads- caused raised, red areas (WELTS) on the skin; "sensitive" pads fell off  . Bee Venom Hives and Other (See Comments)    Welts, also    FAMILY HISTORY: Family  History  Problem Relation Age of Onset  . Breast cancer Paternal Grandmother   . Colon cancer Paternal Grandfather   . Colon cancer Maternal Grandfather   . Colon polyps Maternal Aunt   . Diabetes Mother   . Anesthesia problems Mother        hard to wake up post-op  . Diabetes Sister   . Anesthesia problems Sister        hard to wake up post-op  . Diabetes Father   . Heart disease Father   . Eczema Neg Hx   . Allergic rhinitis Neg Hx   . Asthma Neg Hx     SOCIAL HISTORY: Social History   Socioeconomic History  . Marital status: Single    Spouse name: Not on file  . Number of children: 2  . Years of education: Not on file  . Highest education level: Not on file  Occupational History  . Not on  file  Tobacco Use  . Smoking status: Former Smoker    Packs/day: 0.00    Years: 14.00    Pack years: 0.00    Types: Cigarettes    Quit date: 03/17/2017    Years since quitting: 3.6  . Smokeless tobacco: Never Used  Vaping Use  . Vaping Use: Some days  Substance and Sexual Activity  . Alcohol use: No  . Drug use: No  . Sexual activity: Yes    Birth control/protection: Surgical  Other Topics Concern  . Not on file  Social History Narrative   Lives with boyfriend and 3 children in a one story home.  Does not work.  Education: 9th grade. Right handed    Social Determinants of Health   Financial Resource Strain: Not on file  Food Insecurity: Not on file  Transportation Needs: Not on file  Physical Activity: Not on file  Stress: Not on file  Social Connections: Not on file  Intimate Partner Violence: Not on file     PHYSICAL EXAM: Vitals:   10/21/20 1126  BP: 113/78  Pulse: 93  Resp: 18  SpO2: 98%   General: No acute distress Head:  Normocephalic/atraumatic Skin/Extremities: No rash, no edema Neurological Exam: alert and awake. No aphasia or dysarthria. Fund of knowledge is appropriate.  Recent and remote memory are intact.  Attention and concentration are normal.    Cranial nerves: Pupils equal, round. Extraocular movements intact with no nystagmus. Visual fields full.  No facial asymmetry.  Motor: Bulk and tone normal, muscle strength 5/5 throughout with no pronator drift.   Finger to nose testing intact.  Gait narrow-based and steady, able to tandem walk adequately.  Romberg negative.   IMPRESSION: This is a 30 yo RH woman with a history of bipolar disorder, PTSD, hypertension, who presented in 2017 for new onset daily headaches with migranous features. MRI brain normal. She had a good response to nortriptyline and was able to wean off with no significant headache recurrence. She has rare migraine exacerbations and gets a migraine cocktail with good response. She is not on any preventative medication and will follow-up as needed.   Thank you for allowing me to participate in her care.  Please do not hesitate to call for any questions or concerns.   Ellouise Newer, M.D.   CC: Dr. Ouida Sills

## 2020-10-21 NOTE — Patient Instructions (Signed)
Good to see you! Let me know if there is anything I can help with, follow-up as needed.

## 2020-10-29 DIAGNOSIS — Z9889 Other specified postprocedural states: Secondary | ICD-10-CM | POA: Diagnosis not present

## 2020-10-29 DIAGNOSIS — R29898 Other symptoms and signs involving the musculoskeletal system: Secondary | ICD-10-CM | POA: Diagnosis not present

## 2020-10-29 DIAGNOSIS — M25651 Stiffness of right hip, not elsewhere classified: Secondary | ICD-10-CM | POA: Diagnosis not present

## 2020-10-29 DIAGNOSIS — M25551 Pain in right hip: Secondary | ICD-10-CM | POA: Diagnosis not present

## 2020-10-29 DIAGNOSIS — Z7409 Other reduced mobility: Secondary | ICD-10-CM | POA: Diagnosis not present

## 2020-11-01 DIAGNOSIS — R29898 Other symptoms and signs involving the musculoskeletal system: Secondary | ICD-10-CM | POA: Diagnosis not present

## 2020-11-01 DIAGNOSIS — M25551 Pain in right hip: Secondary | ICD-10-CM | POA: Diagnosis not present

## 2020-11-01 DIAGNOSIS — Z7409 Other reduced mobility: Secondary | ICD-10-CM | POA: Diagnosis not present

## 2020-11-01 DIAGNOSIS — Z9889 Other specified postprocedural states: Secondary | ICD-10-CM | POA: Diagnosis not present

## 2020-11-01 DIAGNOSIS — M25651 Stiffness of right hip, not elsewhere classified: Secondary | ICD-10-CM | POA: Diagnosis not present

## 2020-11-04 ENCOUNTER — Ambulatory Visit: Payer: Medicaid Other

## 2020-11-05 DIAGNOSIS — Z9889 Other specified postprocedural states: Secondary | ICD-10-CM | POA: Diagnosis not present

## 2020-11-05 DIAGNOSIS — Z7409 Other reduced mobility: Secondary | ICD-10-CM | POA: Diagnosis not present

## 2020-11-05 DIAGNOSIS — R29898 Other symptoms and signs involving the musculoskeletal system: Secondary | ICD-10-CM | POA: Diagnosis not present

## 2020-11-05 DIAGNOSIS — M25651 Stiffness of right hip, not elsewhere classified: Secondary | ICD-10-CM | POA: Diagnosis not present

## 2020-11-09 DIAGNOSIS — Z7409 Other reduced mobility: Secondary | ICD-10-CM | POA: Diagnosis not present

## 2020-11-09 DIAGNOSIS — Z9889 Other specified postprocedural states: Secondary | ICD-10-CM | POA: Diagnosis not present

## 2020-11-09 DIAGNOSIS — R29898 Other symptoms and signs involving the musculoskeletal system: Secondary | ICD-10-CM | POA: Diagnosis not present

## 2020-11-12 DIAGNOSIS — Z7409 Other reduced mobility: Secondary | ICD-10-CM | POA: Diagnosis not present

## 2020-11-12 DIAGNOSIS — Z9889 Other specified postprocedural states: Secondary | ICD-10-CM | POA: Diagnosis not present

## 2020-11-12 DIAGNOSIS — R29898 Other symptoms and signs involving the musculoskeletal system: Secondary | ICD-10-CM | POA: Diagnosis not present

## 2020-11-18 DIAGNOSIS — R29898 Other symptoms and signs involving the musculoskeletal system: Secondary | ICD-10-CM | POA: Diagnosis not present

## 2020-11-18 DIAGNOSIS — Z9889 Other specified postprocedural states: Secondary | ICD-10-CM | POA: Diagnosis not present

## 2020-11-18 DIAGNOSIS — Z7409 Other reduced mobility: Secondary | ICD-10-CM | POA: Diagnosis not present

## 2020-11-22 DIAGNOSIS — M25651 Stiffness of right hip, not elsewhere classified: Secondary | ICD-10-CM | POA: Diagnosis not present

## 2020-11-22 DIAGNOSIS — Z7409 Other reduced mobility: Secondary | ICD-10-CM | POA: Diagnosis not present

## 2020-11-22 DIAGNOSIS — R29898 Other symptoms and signs involving the musculoskeletal system: Secondary | ICD-10-CM | POA: Diagnosis not present

## 2020-11-22 DIAGNOSIS — Z9889 Other specified postprocedural states: Secondary | ICD-10-CM | POA: Diagnosis not present

## 2020-11-24 DIAGNOSIS — R29898 Other symptoms and signs involving the musculoskeletal system: Secondary | ICD-10-CM | POA: Diagnosis not present

## 2020-11-24 DIAGNOSIS — Z7409 Other reduced mobility: Secondary | ICD-10-CM | POA: Diagnosis not present

## 2020-11-24 DIAGNOSIS — Z9889 Other specified postprocedural states: Secondary | ICD-10-CM | POA: Diagnosis not present

## 2020-12-09 DIAGNOSIS — R29898 Other symptoms and signs involving the musculoskeletal system: Secondary | ICD-10-CM | POA: Diagnosis not present

## 2020-12-09 DIAGNOSIS — Z9889 Other specified postprocedural states: Secondary | ICD-10-CM | POA: Diagnosis not present

## 2020-12-09 DIAGNOSIS — Z7409 Other reduced mobility: Secondary | ICD-10-CM | POA: Diagnosis not present

## 2020-12-31 ENCOUNTER — Encounter (HOSPITAL_COMMUNITY): Payer: Self-pay

## 2020-12-31 ENCOUNTER — Ambulatory Visit (INDEPENDENT_AMBULATORY_CARE_PROVIDER_SITE_OTHER): Payer: Medicaid Other

## 2020-12-31 ENCOUNTER — Other Ambulatory Visit: Payer: Self-pay

## 2020-12-31 ENCOUNTER — Ambulatory Visit (HOSPITAL_COMMUNITY)
Admission: EM | Admit: 2020-12-31 | Discharge: 2020-12-31 | Disposition: A | Discharge status: Home / Self Care | Payer: Medicaid Other | Attending: Physician Assistant | Admitting: Physician Assistant

## 2020-12-31 DIAGNOSIS — M25522 Pain in left elbow: Secondary | ICD-10-CM

## 2020-12-31 DIAGNOSIS — S59902A Unspecified injury of left elbow, initial encounter: Secondary | ICD-10-CM

## 2020-12-31 HISTORY — DX: Idiopathic urticaria: L50.1

## 2020-12-31 HISTORY — DX: Hypermobile Ehlers-Danlos syndrome: Q79.62

## 2020-12-31 MED ORDER — NAPROXEN 500 MG PO TABS: 500.0000 mg | ORAL_TABLET | Freq: Two times a day (BID) | ORAL | 0 refills | Status: DC

## 2020-12-31 NOTE — ED Triage Notes (Signed)
Pt injured elbow while picking up a 32 year old. Reports hitting elbow on corner of counter. Initial pain radiated up to shoulder, now radiating to wrist. Denies numbness, radial pulse 2+.

## 2020-12-31 NOTE — ED Provider Notes (Signed)
[place]    CSN: [id number] Arrival date & time: 12/31/20  1634      History   Chief Complaint Chief Complaint  Patient presents with   Elbow Injury    HPI Michelle [name] is a 30 y.o. female.   Patient presents today with a several hour history of left elbow pain after injury.  Reports that she went to pick up her 63-year-old nephew and hit the outer portion of her left elbow on the corner of a stone countertop.  She initially had severe pain shooting into her humerus but this has improved and she now has shooting pains into her forearm.  She reports normal range of motion.  Denies any numbness or paresthesias in hand.  She has tried ibuprofen with improvement but not resolution of symptoms.  She is also tried ice this provided minimal relief of symptoms.  She is ambidextrous but uses her right hand to write.  Denies previous injury or surgery involving left elbow.   Past Medical History:  Diagnosis Date   Anxiety    Asthma    Bipolar disorder ([place])    no current med.   Depression    no current med.   Dermatitis 01/16/2019   Family history of adverse reaction to anesthesia    states mother and sister are hard to wake up post-op   Gestational diabetes    History of seizure 06/20/2012   x 1 - during delivery of child(eclampsia)   Hypermobile Ehlers-Danlos syndrome    Hypertension    Idiopathic urticaria    Lipoma of lower back 08/2015   Migraine    Nephrolithiasis    PTSD (post-traumatic stress disorder)    PTSD (post-traumatic stress disorder)    Pyelonephritis 01/16/2019   Seizures ([place])    Tachycardia    TMJ (dislocation of temporomandibular joint)    Vertigo     Patient Active Problem List   Diagnosis Date Noted   Hypermobility syndrome 10/04/2020   Encounter for pre-operative examination 08/17/2020   Nipple discharge in female 06/04/2020   GERD (gastroesophageal reflux disease) 06/04/2020   Labral tear of right hip joint 04/12/2020   Right hip  pain 02/03/2020   Finger pain, right 10/23/2019   Eustachian tube dysfunction, bilateral 12/16/2018   Bee sting allergy 11/06/2018   Allergic reaction 11/04/2018   Mild intermittent asthma without complication 94/17/4081   Chronic rhinitis 11/04/2018   Chronic urticaria 10/04/2018   Rash 11/16/2017   Easy bruising 11/16/2017   Anxiety 10/27/2016   SUI (stress urinary incontinence, female) 04/10/2016   Migraine 02/10/2016   PTSD 03/02/2010   Tobacco use disorder 02/07/2008   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    Past Surgical History:  Procedure Laterality Date   ABDOMINAL HYSTERECTOMY     ADENOIDECTOMY, TONSILLECTOMY AND MYRINGOTOMY WITH TUBE PLACEMENT  1990's   APPENDECTOMY     CESAREAN SECTION  06/20/2012   Procedure: CESAREAN SECTION;  Surgeon: Emily Filbert, MD;  Location: [facility];  Service: Obstetrics;  Laterality: N/A;   HIP SURGERY     LAPAROSCOPIC APPENDECTOMY  07/25/2012   Procedure: APPENDECTOMY LAPAROSCOPIC;  Surgeon: Zenovia Jarred, MD;  Location: [facility];  Service: General;  Laterality: N/A;   LAPAROSCOPIC TUBAL LIGATION Bilateral 08/04/2014   Procedure: BILATERAL LAPAROSCOPIC TUBAL LIGATION;  Surgeon: Woodroe Mode, MD;  Location: [facility];  Service: Gynecology;  Laterality: Bilateral;   LIPOMA EXCISION N/A 09/16/2015   Procedure: EXCISION LIPOMA LUMBAR REGION;  Surgeon: Donnie Mesa, MD;  Location: [facility];  Service: General;  Laterality: N/A;   TONSILLECTOMY     WISDOM TOOTH EXTRACTION      OB History     Gravida  3   Para  3   Term  2   Preterm  1   AB  0   Living  3      SAB  0   IAB  0   Ectopic  0   Multiple  0   Live Births  1        Obstetric Comments  C-Section Indication: Non-reassuring fetal tracing, growth delay & marked proteinuria & Pre-Eclampsia Eclamptic Seizure during C-section delivery          Home Medications    Prior to Admission medications   Medication Sig Start Date End  Date Taking? Authorizing Provider  albuterol (VENTOLIN HFA) 108 (90 Base) MCG/ACT inhaler INHALE 2 PUFFS INTO THE LUNGS EVERY 4 HOURS AS NEEDED FOR WHEEZING OR SHORTNESS OF BREATH Patient taking differently: Inhale 2 puffs into the lungs every 4 (four) hours as needed for wheezing. 06/27/19  Yes Myles Gip, DO  cetirizine (ZYRTEC ALLERGY) 10 MG tablet Take 1 tablet (10 mg total) by mouth daily. 08/06/20  Yes Jaynee Eagles, PA-C  EPINEPHrine (EPIPEN 2-PAK) 0.3 mg/0.3 mL IJ SOAJ injection Inject 0.3 mLs (0.3 mg total) into the muscle once as needed for anaphylaxis. 01/23/20  Yes Althea Charon, FNP  famotidine (PEPCID) 20 MG tablet Take 1 tablet (20 mg total) by mouth 2 (two) times daily. 01/23/20  Yes Althea Charon, FNP  dicyclomine (BENTYL) 20 MG tablet Take 1 tablet (20 mg total) by mouth 2 (two) times daily as needed for spasms. 05/20/20   Rosemarie Ax, MD  doxycycline (VIBRAMYCIN) 100 MG capsule Take by mouth in the morning and at bedtime. Patient not taking: Reported on 10/21/2020 09/06/20   [provider]  Multiple Vitamin (MULTIVITAMIN ADULT PO) Take by mouth.    [provider]  naproxen (NAPROSYN) 500 MG tablet Take 1 tablet (500 mg total) by mouth 2 (two) times daily with a meal. 12/31/20   Demarious Kapur K, PA-C  ondansetron (ZOFRAN ODT) 4 MG disintegrating tablet Take 1 tablet (4 mg total) by mouth every 8 (eight) hours as needed for nausea or vomiting. 04/19/20   Joy, Shawn C, PA-C  polyethylene glycol (MIRALAX / GLYCOLAX) 17 g packet Take 17 g by mouth daily as needed for mild constipation. Patient not taking: Reported on 10/21/2020    [provider]  Potassium Citrate 15 MEQ (1620 MG) TBCR Take 1 tablet by mouth 2 (two) times daily. Patient not taking: Reported on 10/21/2020 07/29/20   [provider]  pseudoephedrine (SUDAFED) 60 MG tablet Take 1 tablet (60 mg total) by mouth every 8 (eight) hours as needed for congestion. Patient not taking: Reported  on 10/21/2020 08/06/20   Jaynee Eagles, PA-C  tamsulosin (FLOMAX) 0.4 MG CAPS capsule Take 1 capsule (0.4 mg total) by mouth daily. Patient not taking: Reported on 10/21/2020 04/19/20   Joy, Raquel Sarna C, PA-C  fluticasone (FLONASE) 50 MCG/ACT nasal spray Place 1 spray into both nostrils daily. Patient not taking: No sig reported 01/06/19 08/06/20  Garnet Sierras, DO  nortriptyline (PAMELOR) 25 MG capsule Take 1 capsule (25 mg total) by mouth at bedtime. 04/09/20 08/06/20  Cameron Sprang, MD    Family History Family History  Problem Relation Age of Onset   Diabetes  Mother    Anesthesia problems Mother        hard to wake up post-op   Atrial fibrillation Mother    Congestive Heart Failure Mother    Diabetes Father    Heart disease Father    Diabetes Sister    Anesthesia problems Sister        hard to wake up post-op   Colon cancer Maternal Grandfather    Breast cancer Paternal Grandmother    Colon cancer Paternal Grandfather    Colon polyps Maternal Aunt    Eczema Neg Hx    Allergic rhinitis Neg Hx    Asthma Neg Hx     Social History Social History   Tobacco Use   Smoking status: Former    Packs/day: 0.00    Years: 14.00    Pack years: 0.00    Types: Cigarettes    Quit date: 03/17/2017    Years since quitting: 3.7   Smokeless tobacco: Never  Vaping Use   Vaping Use: Some days  Substance Use Topics   Alcohol use: No   Drug use: No     Allergies   Adhesive [tape] and Bee venom   Review of Systems Review of Systems  Constitutional:  Positive for activity change. Negative for appetite change, fatigue and fever.  Respiratory:  Negative for cough and shortness of breath.   Cardiovascular:  Negative for chest pain.  Gastrointestinal:  Negative for abdominal pain, diarrhea, nausea and vomiting.  Musculoskeletal:  Positive for arthralgias. Negative for myalgias.  Neurological:  Negative for dizziness, weakness, light-headedness, numbness and headaches.    Physical Exam Triage Vital  Signs ED Triage Vitals  Enc Vitals Group     BP 12/31/20 1653 118/76     Pulse Rate 12/31/20 1653 80     Resp 12/31/20 1653 18     Temp 12/31/20 1653 98.8 F (37.1 C)     Temp Source 12/31/20 1653 Oral     SpO2 12/31/20 1653 100 %     Weight --      Height --      Head Circumference --      Peak Flow --      Pain Score 12/31/20 1647 7     Pain Loc --      Pain Edu? --      Excl. in [place]? --    No data found.  Updated Vital Signs BP 118/76   Pulse 80   Temp 98.8 F (37.1 C) (Oral)   Resp 18   LMP 08/14/2016 (Exact Date)   SpO2 100%   Visual Acuity Right Eye Distance:   Left Eye Distance:   Bilateral Distance:    Right Eye Near:   Left Eye Near:    Bilateral Near:     Physical Exam Vitals reviewed.  Constitutional:      General: She is awake. She is not in acute distress.    Appearance: Normal appearance. She is normal weight. She is not ill-appearing.     Comments: Very pleasant female appears stated age in no acute distress  HENT:     Head: Normocephalic and atraumatic.  Cardiovascular:     Rate and Rhythm: Normal rate and regular rhythm.     Pulses:          Radial pulses are 2+ on the right side and 2+ on the left side.     Heart sounds: Normal heart sounds, S1 normal and S2 normal. No murmur heard. Pulmonary:  Effort: Pulmonary effort is normal.     Breath sounds: Normal breath sounds. No wheezing, rhonchi or rales.  Abdominal:     General: Bowel sounds are normal.     Palpations: Abdomen is soft.     Tenderness: There is no abdominal tenderness. There is no right CVA tenderness, left CVA tenderness, guarding or rebound.  Musculoskeletal:     Left elbow: No swelling. Decreased range of motion. Tenderness present. No lateral epicondyle tenderness.       Arms:  Psychiatric:        Behavior: Behavior is cooperative.     UC Treatments / Results  Labs (all labs ordered are listed, but only abnormal results are displayed) Labs Reviewed - No data to  display  EKG   Radiology DG Elbow Complete Left  Result Date: 12/31/2020 CLINICAL DATA:  Elbow injury EXAM: LEFT ELBOW - COMPLETE 3+ VIEW COMPARISON:  None. FINDINGS: No fracture or malalignment.  No significant elbow effusion. IMPRESSION: Negative. Electronically Signed   By: Donavan Foil M.D.   On: 12/31/2020 17:44    Procedures Procedures (including critical care time)  Medications Ordered in UC Medications - No data to display  Initial Impression / Assessment and Plan / UC Course  I have reviewed the triage vital signs and the nursing notes.  Pertinent labs & imaging results that were available during my care of the patient were reviewed by me and considered in my medical decision making (see chart for details).      X-ray was normal with no acute osseous abnormality.  Patient was prescribed Naprosyn to be used twice daily as needed for pain and inflammation with instruction not to take additional NSAIDs including aspirin, ibuprofen/Advil, naproxen/Aleve due to risk of GI bleeding.  Recommend she continue with ice and use Ace bandage to help manage symptoms.  Encouraged her to rest and avoid strenuous activities involving left elbow.  Discussed alarm symptoms that warrant emergent evaluation.  Strict return precautions given to which patient expressed understanding.  Final Clinical Impressions(s) / UC Diagnoses   Final diagnoses:  Left elbow pain  Elbow injury, left, initial encounter     Discharge Instructions      Your x-ray did not show any fractures which is excellent news.  I suspect that this is a bone bruise causing your symptoms and will likely take several weeks to go away.  I have called in Naprosyn to help with pain and inflammation.  Do not take additional NSAIDs including aspirin, ibuprofen/Advil, naproxen/Aleve with this medication due to risk of GI bleeding.  Use ice and an Ace bandage to help manage symptoms.  If anything worsens please return for  reevaluation.     ED Prescriptions     Medication Sig Dispense Auth. Provider   naproxen (NAPROSYN) 500 MG tablet Take 1 tablet (500 mg total) by mouth 2 (two) times daily with a meal. 20 tablet Julius Matus K, PA-C      PDMP not reviewed this encounter.   Terrilee Croak, PA-C 12/31/20 1753

## 2020-12-31 NOTE — Discharge Instructions (Addendum)
Your x-ray did not show any fractures which is excellent news.  I suspect that this is a bone bruise causing your symptoms and will likely take several weeks to go away.  I have called in Naprosyn to help with pain and inflammation.  Do not take additional NSAIDs including aspirin, ibuprofen/Advil, naproxen/Aleve with this medication due to risk of GI bleeding.  Use ice and an Ace bandage to help manage symptoms.  If anything worsens please return for reevaluation.

## 2021-01-05 ENCOUNTER — Ambulatory Visit (INDEPENDENT_AMBULATORY_CARE_PROVIDER_SITE_OTHER): Payer: Medicaid Other | Admitting: Allergy

## 2021-01-05 ENCOUNTER — Other Ambulatory Visit: Payer: Self-pay

## 2021-01-05 ENCOUNTER — Other Ambulatory Visit: Payer: Self-pay | Admitting: Allergy

## 2021-01-05 ENCOUNTER — Telehealth: Payer: Self-pay

## 2021-01-05 ENCOUNTER — Encounter: Payer: Self-pay | Admitting: Allergy

## 2021-01-05 VITALS — BP 122/60 | HR 124 | Temp 98.3°F | Resp 16 | Ht 62.0 in | Wt 152.2 lb

## 2021-01-05 DIAGNOSIS — L508 Other urticaria: Secondary | ICD-10-CM

## 2021-01-05 DIAGNOSIS — Z91030 Bee allergy status: Secondary | ICD-10-CM | POA: Diagnosis not present

## 2021-01-05 DIAGNOSIS — J4520 Mild intermittent asthma, uncomplicated: Secondary | ICD-10-CM | POA: Diagnosis not present

## 2021-01-05 MED ORDER — CETIRIZINE HCL 10 MG PO TABS: ORAL_TABLET | ORAL | 5 refills | Status: DC

## 2021-01-05 MED ORDER — EPINEPHRINE 0.3 MG/0.3ML IJ SOAJ: 0.3000 mg | Freq: Once | INTRAMUSCULAR | 1 refills | Status: DC | PRN

## 2021-01-05 MED ORDER — FAMOTIDINE 20 MG PO TABS
20.0000 mg | ORAL_TABLET | Freq: Two times a day (BID) | ORAL | 5 refills | Status: DC
Start: 2021-01-05 — End: 2021-07-21

## 2021-01-05 NOTE — Telephone Encounter (Signed)
Patient scheduled an appointment for today with Dr. Maudie Mercury at 3pm.

## 2021-01-05 NOTE — Progress Notes (Signed)
Follow Up Note  RE: Michelle [name] MRN: [id number] DOB: 02-12-1991 Date of Office Visit: 01/05/2021  Referring provider: Daisy Floro, DO Primary care provider: Daisy Floro, DO  Chief Complaint: Urticaria (Out break the past week or so and has broken skin due to scratching during the night. )  History of Present Illness: I had the pleasure of seeing Michelle [name] for a follow up visit at the [facility] on 01/05/2021. She is a 30 y.o. female, who is being followed for urticaria, asthma, rhinitis and hymenoptera reaction. Her previous allergy office visit was on 01/23/2020 with Althea Charon, FNP. Today is a regular follow up visit.  Chronic urticaria Patient has been having issues with hives on the legs and scratching herself at night for the past week. Denies any changes in diet, medications, personal care products. She did have a URI 1 month ago and some increased stress at home as she is watching her nephew now as well.   Currently taking zyrtec 36m BID, famotidine 22mBID.  She had no hives for about 6 months and was able to come off antihistamines completely for about 4 months.  Denies any associated symptoms.   No recent prednisone use.   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    Mild intermittent asthma Denies any SOB, coughing, wheezing, chest tightness, nocturnal awakenings, ER/urgent care visits or prednisone use since the last visit. No inhaler use.    Bee sting (blood work slightly positive to honeybee 0.19 kU/L No stings and has epipen on hand if needed.   Assessment and Plan: Michelle [name] a 89.o. female with: Chronic urticaria Past history - Reaction to premade oatmeal cookies 1 year ago in the form of rash/hives. Patient used to tolerate these cookies with no issues. Then in March/April noticed difficulty  breathing after eating eggs and on Easter had a hive outbreak after eating an egg. 2020 testing negative to foods. 2020 immunocap negative to foods as well. Slightly elevated tryptase, normal c kit.  Interim history - Was doing well until 1 week ago. No hives for 6 months and stopped antihistamines for 4 months. Possibly increased stress but no other changes. Continue zyrtec (cetirizine) 1032m tablets twice a day. If hives are not controlled or causes drowsiness let us [place]. Continue Pepcid (famotidine) 44m31mice a day.  Avoid the following potential triggers: alcohol, tight clothing, NSAIDs, hot showers and getting overheated. Start prednisone taper.  If hives not controlled then will start on Xolair injections - handout given.  Discussed with patient that her tryptase level was borderline positive and with normal c kit there is low likelihood of having a mast cell disease. Even if she had a mast cell issue the initial treatment is antihistamines which she is on already.  If symptoms not improving, then consider referral to mast cell specialist [place] Past history - Diagnosed with asthma over 10 years ago and currently has albuterol which helps. Interim history - no inhaler use.  May use albuterol rescue inhaler 2 puffs or nebulizer every 4 to 6 hours as needed for shortness of breath, chest tightness, coughing, and wheezing. May use albuterol rescue inhaler 2 puffs 5 to 15 minutes prior to strenuous physical activities. Monitor frequency of use.  Bee sting allergy Past history - Stung by an insect 6 months ago and had hives for 1 week. Needed steroid injection. Bloodwork slightly positive to honeybee. Interim history - no stings since the last visit.  Avoid stinging insects. For mild symptoms you can take over the counter antihistamines such as Benadryl and monitor symptoms closely. If symptoms worsen or if you have  severe symptoms including breathing issues, throat closure, significant swelling, whole body hives, severe diarrhea and vomiting, lightheadedness then inject epinephrine and seek immediate medical care afterwards.  Return in about 2 months (around 03/07/2021).  Meds ordered this encounter  Medications   famotidine (PEPCID) 20 MG tablet    Sig: Take 1 tablet (20 mg total) by mouth 2 (two) times daily.    Dispense:  60 tablet    Refill:  5   EPINEPHrine (EPIPEN 2-PAK) 0.3 mg/0.3 mL IJ SOAJ injection    Sig: Inject 0.3 mg into the muscle once as needed for anaphylaxis.    Dispense:  2 each    Refill:  1    Please dispense Mylan or Teva brand generic only.   cetirizine (ZYRTEC) 10 MG tablet    Sig: TAKE 2 TABLETS(20 MG) BY MOUTH TWICE DAILY    Dispense:  120 tablet    Refill:  5    Lab Orders  No laboratory test(s) ordered today    Diagnostics: None.  Medication List:  Current Outpatient Medications  Medication Sig Dispense Refill   albuterol (VENTOLIN HFA) 108 (90 Base) MCG/ACT inhaler INHALE 2 PUFFS INTO THE LUNGS EVERY 4 HOURS AS NEEDED FOR WHEEZING OR SHORTNESS OF BREATH (Patient taking differently: Inhale 2 puffs into the lungs every 4 (four) hours as needed for wheezing.) 8.5 g 2   cetirizine (ZYRTEC) 10 MG tablet TAKE 2 TABLETS(20 MG) BY MOUTH TWICE DAILY 120 tablet 5   EPINEPHrine (EPIPEN 2-PAK) 0.3 mg/0.3 mL IJ SOAJ injection Inject 0.3 mg into the muscle once as needed for anaphylaxis. 2 each 1   famotidine (PEPCID) 20 MG tablet Take 1 tablet (20 mg total) by mouth 2 (two) times daily. 60 tablet 5   Multiple Vitamin (MULTIVITAMIN ADULT PO) Take by mouth.     No current facility-administered medications for this visit.   Allergies: Allergies  Allergen Reactions   Adhesive [Tape] Hives and Other (See Comments)    EKG or heart monitor pads- caused raised, red areas (WELTS) on the skin; "sensitive" pads fell off   Bee Venom Hives and Other (See Comments)    Welts, also    I reviewed her past medical history, social history, family history, and environmental history and no significant changes have been reported from her previous visit.  Review of Systems  Constitutional:  Negative for appetite change, chills, fever and unexpected weight change.  HENT:  Negative for congestion, rhinorrhea and sneezing.   Eyes:  Negative for itching.  Respiratory:  Negative for cough, chest tightness, shortness of breath and wheezing.   Cardiovascular:  Negative for chest pain.  Gastrointestinal:  Negative for abdominal pain.  Genitourinary:  Negative for difficulty urinating.  Skin:  Positive for rash.  Allergic/Immunologic: Negative for environmental allergies.  Neurological:  Negative for headaches.   Objective: BP 122/60   Pulse (!) 124   Temp 98.3 F (36.8 C) (Temporal)   Resp 16   Ht '5\' 2"'  (1.575 m)   Wt 152 lb 3.2 oz (69 kg)   LMP 08/14/2016 (Exact Date)   SpO2 98%   BMI 27.84  kg/m  Body mass index is 27.84 kg/m. Physical Exam Vitals and nursing note reviewed.  Constitutional:      Appearance: She is well-developed.  HENT:     Head: Normocephalic and atraumatic.     Right Ear: Tympanic membrane and external ear normal.     Left Ear: Tympanic membrane and external ear normal.     Nose: Nose normal.     Mouth/Throat:     Mouth: Mucous membranes are moist.  Eyes:     Conjunctiva/sclera: Conjunctivae normal.  Cardiovascular:     Rate and Rhythm: Normal rate and regular rhythm.     Heart sounds: Normal heart sounds. No murmur heard.   No friction rub. No gallop.  Pulmonary:     Effort: Pulmonary effort is normal.     Breath sounds: Normal breath sounds. No wheezing or rales.  Musculoskeletal:     Cervical back: Neck supple.  Skin:    General: Skin is warm.     Findings: No rash.     Comments: No hives on exam but excoriations marks on lower extremities.  Neurological:     Mental Status: She is alert and oriented to person, place, and time.   Psychiatric:        Behavior: Behavior normal.  Previous notes and tests were reviewed. The plan was reviewed with the patient/family, and all questions/concerned were addressed.  It was my pleasure to see Keelee today and participate in her care. Please feel free to contact me with any questions or concerns.  Sincerely,  Rexene Alberts, DO Allergy & Immunology  [facility] office: [place] office: [phone]

## 2021-01-05 NOTE — Assessment & Plan Note (Signed)
Past history - Diagnosed with asthma over 10 years ago and currently has albuterol which helps. Interim history - no inhaler use.   May use albuterol rescue inhaler 2 puffs or nebulizer every 4 to 6 hours as needed for shortness of breath, chest tightness, coughing, and wheezing. May use albuterol rescue inhaler 2 puffs 5 to 15 minutes prior to strenuous physical activities. Monitor frequency of use.

## 2021-01-05 NOTE — Assessment & Plan Note (Addendum)
Past history - Reaction to premade oatmeal cookies 1 year ago in the form of rash/hives. Patient used to tolerate these cookies with no issues. Then in March/April noticed difficulty breathing after eating eggs and on Easter had a hive outbreak after eating an egg. 2020 testing negative to foods. 2020 immunocap negative to foods as well. Slightly elevated tryptase, normal c kit.  Interim history - Was doing well until 1 week ago. No hives for 6 months and stopped antihistamines for 4 months. Possibly increased stress but no other changes.  Continue zyrtec (cetirizine) 71m 2 tablets twice a day.  If hives are not controlled or causes drowsiness let [place].  Continue Pepcid (famotidine) 287mtwice a day.  . Avoid the following potential triggers: alcohol, tight clothing, NSAIDs, hot showers and getting overheated. . Start prednisone taper.  . If hives not controlled then will start on Xolair injections - handout given.  . Discussed with patient that her tryptase level was borderline positive and with normal c kit there is low likelihood of having a mast cell disease. Even if she had a mast cell issue the initial treatment is antihistamines which she is on already.  o If symptoms not improving, then consider referral to mast cell specialist - [place]

## 2021-01-05 NOTE — Assessment & Plan Note (Signed)
Past history - Stung by an insect 6 months ago and had hives for 1 week. Needed steroid injection. Bloodwork slightly positive to honeybee. Interim history - no stings since the last visit.  . Avoid stinging insects. . For mild symptoms you can take over the counter antihistamines such as Benadryl and monitor symptoms closely. If symptoms worsen or if you have severe symptoms including breathing issues, throat closure, significant swelling, whole body hives, severe diarrhea and vomiting, lightheadedness then inject epinephrine and seek immediate medical care afterwards. 

## 2021-01-05 NOTE — Patient Instructions (Addendum)
Chronic urticaria Continue zyrtec (cetirizine) 10mg  2 tablets twice a day. If hives are not controlled or causes drowsiness let us know. Continue pepcid (famotidine) 20mg  twice a day.  Avoid the following potential triggers: alcohol, tight clothing, NSAIDs, hot showers and getting overheated.  Prednisone 10mg  tablet pack: 2 tablets given in office today. Take 2 more tablets before bed today.  Then take 2 tablets twice a day for 2 more days. Then take 2 tablets once a day for 1 day. Then take 1 tablet once a day for 1 day.   If hives not controlled then will start on Xolair injections - handout given.   Mild intermittent asthma May use albuterol rescue inhaler 2 puffs every 4 to 6 hours as needed for shortness of breath, chest tightness, coughing, and wheezing. Monitor frequency of use. Asthma control goals:  Full participation in all desired activities (may need albuterol before activity) Albuterol use two times or less a week on average (not counting use with activity) Cough interfering with sleep two times or less a month Oral steroids no more than once a year No hospitalization  Bee sting  Avoid stinging insects. For mild symptoms you can take over the counter antihistamines such as Benadryl and monitor symptoms closely. If symptoms worsen or if you have severe symptoms including breathing issues, throat closure, significant swelling, whole body hives, severe diarrhea and vomiting, lightheadedness then inject epinephrine and seek immediate medical care afterwards.  Follow up in 2 months to check on hives.

## 2021-01-05 NOTE — Telephone Encounter (Signed)
Patient needs an appointment for refills. I called earlier to send in a 30 day courtesy refill. Due to phone issues I was not able to transfer the call to get an appointment set up.

## 2021-01-07 ENCOUNTER — Telehealth: Payer: Self-pay | Admitting: *Deleted

## 2021-01-07 NOTE — Telephone Encounter (Signed)
PA has been approved for EpiPen. PA form has been faxed to patient's pharmacy, labeled, and placed in bulk scanning.

## 2021-01-07 NOTE — Telephone Encounter (Signed)
PA has been submitted through CoverMyMeds for Epipen and is currently pending approval/denial.

## 2021-01-10 DIAGNOSIS — M25551 Pain in right hip: Secondary | ICD-10-CM | POA: Diagnosis not present

## 2021-01-10 DIAGNOSIS — Z9889 Other specified postprocedural states: Secondary | ICD-10-CM | POA: Diagnosis not present

## 2021-01-12 ENCOUNTER — Telehealth: Payer: Self-pay | Admitting: Neurology

## 2021-01-12 NOTE — Telephone Encounter (Signed)
That is okay if she can get here before we close, thanks

## 2021-01-12 NOTE — Telephone Encounter (Signed)
Pt called in wanting to see if she could come get a headache cocktail today?

## 2021-01-13 ENCOUNTER — Ambulatory Visit: Payer: Medicaid Other

## 2021-01-13 ENCOUNTER — Other Ambulatory Visit: Payer: Self-pay

## 2021-01-13 DIAGNOSIS — G43009 Migraine without aura, not intractable, without status migrainosus: Secondary | ICD-10-CM

## 2021-01-13 MED ORDER — DIPHENHYDRAMINE HCL 50 MG/ML IJ SOLN
25.0000 mg | Freq: Once | INTRAMUSCULAR | Status: DC
Start: 2021-01-13 — End: 2021-09-08

## 2021-01-13 MED ORDER — METOCLOPRAMIDE HCL 5 MG/ML IJ SOLN: 10.0000 mg | Freq: Once | INTRAMUSCULAR | Status: DC

## 2021-01-13 MED ORDER — KETOROLAC TROMETHAMINE 60 MG/2ML IM SOLN
60.0000 mg | Freq: Once | INTRAMUSCULAR | Status: AC
Start: 2021-01-13 — End: 2021-01-18

## 2021-01-13 NOTE — Progress Notes (Signed)
Patient has a driver. Patient stated that she is leaving her car in the parking lot and her driver will be driving her home. Patient states she will pick her car up tomorrow when she is able to drive.

## 2021-01-13 NOTE — Telephone Encounter (Signed)
Called patient and informed her that per Dr. Delice Lesch she may come and get a headache cocktail. Informed patient to be here before 3:30pm and a driver needs to be in office. Patient verbalized understanding and will be here around 2pm-3pm.

## 2021-01-14 ENCOUNTER — Ambulatory Visit: Payer: Medicaid Other

## 2021-01-26 DIAGNOSIS — N200 Calculus of kidney: Secondary | ICD-10-CM | POA: Diagnosis not present

## 2021-02-07 ENCOUNTER — Ambulatory Visit: Payer: Medicaid Other | Admitting: Family Medicine

## 2021-02-07 DIAGNOSIS — Z20822 Contact with and (suspected) exposure to covid-19: Secondary | ICD-10-CM | POA: Diagnosis not present

## 2021-02-07 DIAGNOSIS — R42 Dizziness and giddiness: Secondary | ICD-10-CM | POA: Diagnosis not present

## 2021-02-09 NOTE — Progress Notes (Signed)
    SUBJECTIVE:   CHIEF COMPLAINT / HPI:   Urgent care follow up and low blood pressure  Woke up with dizziness Monday morning, and was stumbling while walking. She went to urgent care and was found to have low blood pressure. The did an EKG which was normal, and she was sent home. She felt better on Tuesday, but appreciates some dizziness from time to time. She has never felt like she may loose consciousness or fall. The dizziness isn't made better or worse by position and does change if she moves from laying/seating/standing. She complains of no, tachycardia, sweating, or warm sensation associated with symptoms.  Patient has a remote history of dizziness in 2017, dizziness was described as room spinning and got better when she closed her eyes. She was seen by cardiology and told she has tachycardia. Patient was recently diagnosed with Drue Dun and is taking Zyrtec and Pepcid for chronic urticaria. She takes no other medications or supplements, has a good diet and hydration (2 balanced meals a day). Patient no longer has her cycle as she had a hysterectomy in 2018.    PERTINENT  PMH / PSH: Ehlers Danlos, Chronic urticaria, GERD, anxiety  OBJECTIVE:   BP 114/83 Comment: Standing up  Pulse 62   Ht '5\' 2"'$  (1.575 m)   Wt 154 lb (69.9 kg)   LMP 08/14/2016 (Exact Date)   SpO2 97%   BMI 28.17 kg/m   Physical Exam Constitutional:      Appearance: Normal appearance.  Cardiovascular:     Rate and Rhythm: Normal rate and regular rhythm.     Pulses: Normal pulses.     Heart sounds: Normal heart sounds. No murmur heard.   No friction rub. No gallop.  Pulmonary:     Effort: Pulmonary effort is normal. No respiratory distress.     Breath sounds: Normal breath sounds. No stridor. No wheezing or rhonchi.  Abdominal:     General: Bowel sounds are normal. There is no distension.     Palpations: Abdomen is soft. There is no mass.  Neurological:     General: No focal deficit present.      Mental Status: She is alert and oriented to person, place, and time. Mental status is at baseline.  Patient had no swaying with romberg test. Orthostatic BP taken with no difference in BP between positions Patient did not appreciate any dizziness when turning head or laying down.  ASSESSMENT/PLAN:   Dizziness Patient feeling dizzy from time to time, better since initial experience, with no concerns for vertigo, or postural hypotension given physical exam and history -Potential etiology could be:  Anemia - checking CBC  Hormonal - checking TSH -Encouraged patient to monitor and drink fluids -Consoled to f/u if symptoms worsen or change     Holley Bouche, MD [address]

## 2021-02-10 ENCOUNTER — Encounter: Payer: Self-pay | Admitting: Student

## 2021-02-10 ENCOUNTER — Ambulatory Visit (INDEPENDENT_AMBULATORY_CARE_PROVIDER_SITE_OTHER): Payer: Medicaid Other | Admitting: Student

## 2021-02-10 ENCOUNTER — Other Ambulatory Visit: Payer: Self-pay

## 2021-02-10 VITALS — BP 114/83 | HR 62 | Ht 62.0 in | Wt 154.0 lb

## 2021-02-10 DIAGNOSIS — R42 Dizziness and giddiness: Secondary | ICD-10-CM | POA: Diagnosis not present

## 2021-02-10 HISTORY — DX: Dizziness and giddiness: R42

## 2021-02-10 NOTE — Assessment & Plan Note (Signed)
Patient feeling dizzy from time to time, better since initial experience, with no concerns for vertigo, or postural hypotension given physical exam and history -Potential etiology could be:  Anemia - checking CBC  Hormonal - checking TSH -Encouraged patient to monitor and drink fluids -Consoled to f/u if symptoms worsen or change

## 2021-02-10 NOTE — Patient Instructions (Signed)
It was great to see you! Thank you for allowing me to participate in your care!   Our plans for today:  -Light headed/dizziness - CBC to check for anemia - TSH to check for hormonal cause - Drink fluids and stay hydrated to help with blood pressure  We are checking some labs today, I will call you if they are abnormal will send you a MyChart message or a letter if they are normal.  If you do not hear about your labs in the next 2 weeks please let us know.  Take care and seek immediate care sooner if you develop any concerns.   Dr. Holley Bouche, MD [place]

## 2021-02-11 ENCOUNTER — Encounter: Payer: Self-pay | Admitting: Student

## 2021-02-11 LAB — TSH: TSH: 1.19 u[IU]/mL (ref 0.450–4.500)

## 2021-02-11 LAB — CBC
Hematocrit: 40.9 % (ref 34.0–46.6)
Hemoglobin: 13.6 g/dL (ref 11.1–15.9)
MCH: 30.2 pg (ref 26.6–33.0)
MCHC: 33.3 g/dL (ref 31.5–35.7)
MCV: 91 fL (ref 79–97)
Platelets: 256 10*3/uL (ref 150–450)
RBC: 4.5 x10E6/uL (ref 3.77–5.28)
RDW: 11.8 % (ref 11.7–15.4)
WBC: 6.9 10*3/uL (ref 3.4–10.8)

## 2021-03-07 ENCOUNTER — Other Ambulatory Visit: Payer: Self-pay

## 2021-03-07 ENCOUNTER — Encounter: Payer: Self-pay | Admitting: Family Medicine

## 2021-03-07 ENCOUNTER — Ambulatory Visit (INDEPENDENT_AMBULATORY_CARE_PROVIDER_SITE_OTHER): Payer: Medicaid Other | Admitting: Family Medicine

## 2021-03-07 VITALS — BP 104/73 | HR 94 | Ht 62.0 in | Wt 156.8 lb

## 2021-03-07 DIAGNOSIS — D179 Benign lipomatous neoplasm, unspecified: Secondary | ICD-10-CM | POA: Diagnosis present

## 2021-03-07 NOTE — Progress Notes (Signed)
    SUBJECTIVE:   CHIEF COMPLAINT / HPI:   Concern for return of lipoma Patient presents out of concern that she has a lipoma on her left lower back.  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  She is concerned that a new one is popping up.  She can palpate it and it hurts anytime she sits in the car or sits in a solid chair.  It has been bothering her for several months.  Denies any radiation of the pain.  OBJECTIVE:   BP 104/73   Pulse 94   Ht '5\' 2"'$  (1.575 m)   Wt 156 lb 12.8 oz (71.1 kg)   LMP 08/14/2016 (Exact Date)   SpO2 100%   BMI 28.68 kg/m   General: Well-appearing 30 year old female no acute distress Cardiac: Regular rate and rhythm, no murmurs appreciated Respiratory: Normal work of breathing Abdomen: Soft, nontender, positive bowel sounds Derm: Patient with quarter sized movable mass just above the waistline on the left side of her lower back.  ASSESSMENT/PLAN:   Lipoma Concern for formation of new lipoma in left lower back.  Discussed options and have referred patient to surgery for evaluation and possible excision.     Gifford Shave, MD [place]

## 2021-03-07 NOTE — Patient Instructions (Signed)
It was great seeing you today.  I am sorry you are having these issues with the lipoma.  I have placed a referral for general surgery and someone will be calling you to schedule an appointment to assess for possible removal.  Regarding your nose piercing I recommend keeping it clean and using topical antibiotics.  I have provided some bacitracin for you.  If the pain worsens, you notice any drainage, becomes warm to touch, you have any fevers please seek medical attention immediately.  If you have any questions or concerns between now and then please call the clinic.  I hope you have a wonderful afternoon!

## 2021-03-08 DIAGNOSIS — Z9889 Other specified postprocedural states: Secondary | ICD-10-CM | POA: Diagnosis not present

## 2021-03-08 DIAGNOSIS — M25551 Pain in right hip: Secondary | ICD-10-CM | POA: Diagnosis not present

## 2021-03-08 NOTE — Assessment & Plan Note (Signed)
Concern for formation of new lipoma in left lower back.  Discussed options and have referred patient to surgery for evaluation and possible excision.

## 2021-03-14 DIAGNOSIS — K6389 Other specified diseases of intestine: Secondary | ICD-10-CM | POA: Diagnosis not present

## 2021-03-14 DIAGNOSIS — N202 Calculus of kidney with calculus of ureter: Secondary | ICD-10-CM | POA: Diagnosis not present

## 2021-03-14 DIAGNOSIS — N200 Calculus of kidney: Secondary | ICD-10-CM | POA: Diagnosis not present

## 2021-03-14 DIAGNOSIS — Z90710 Acquired absence of both cervix and uterus: Secondary | ICD-10-CM | POA: Diagnosis not present

## 2021-03-14 DIAGNOSIS — K3189 Other diseases of stomach and duodenum: Secondary | ICD-10-CM | POA: Diagnosis not present

## 2021-03-15 NOTE — Progress Notes (Deleted)
Follow Up Note  RE: EDEE [name] MRN: [id number] DOB: May 11, 1991 Date of Office Visit: 03/16/2021  Referring provider: Donney Dice, DO Primary care provider: Donney Dice, DO  Chief Complaint: No chief complaint on file.  History of Present Illness: I had the pleasure of seeing Michelle [name] for a follow up visit at the [facility] on 03/15/2021. She is a 30 y.o. female, who is being followed for chronic urticaria, asthma, bee sting allergy. Her previous allergy office visit was on 01/05/2021 with Dr. Maudie [name]. Today is a regular follow up visit.  Chronic urticaria Past history - Reaction to premade oatmeal cookies 1 year ago in the form of rash/hives. Patient used to tolerate these cookies with no issues. Then in March/April noticed difficulty breathing after eating eggs and on Easter had a hive outbreak after eating an egg. 2020 testing negative to foods. 2020 immunocap negative to foods as well. Slightly elevated tryptase, normal c kit.  Interim history - Was doing well until 1 week ago. No hives for 6 months and stopped antihistamines for 4 months. Possibly increased stress but no other changes. Continue zyrtec (cetirizine) 51m 2 tablets twice a day. If hives are not controlled or causes drowsiness let [place]. Continue Pepcid (famotidine) 25mtwice a day.  Avoid the following potential triggers: alcohol, tight clothing, NSAIDs, hot showers and getting overheated. Start prednisone taper.  If hives not controlled then will start on Xolair injections - handout given.  Discussed with patient that her tryptase level was borderline positive and with normal c kit there is low likelihood of having a mast cell disease. Even if she had a mast cell issue the initial treatment is antihistamines which she is on already.  If symptoms not improving, then consider referral to mast cell specialist - closest one is in [place]   Mild intermittent asthma without complication Past  history - Diagnosed with asthma over 10 years ago and currently has albuterol which helps. Interim history - no inhaler use.  May use albuterol rescue inhaler 2 puffs or nebulizer every 4 to 6 hours as needed for shortness of breath, chest tightness, coughing, and wheezing. May use albuterol rescue inhaler 2 puffs 5 to 15 minutes prior to strenuous physical activities. Monitor frequency of use.    Bee sting allergy Past history - Stung by an insect 6 months ago and had hives for 1 week. Needed steroid injection. Bloodwork slightly positive to honeybee. Interim history - no stings since the last visit.  Avoid stinging insects. For mild symptoms you can take over the counter antihistamines such as Benadryl and monitor symptoms closely. If symptoms worsen or if you have severe symptoms including breathing issues, throat closure, significant swelling, whole body hives, severe diarrhea and vomiting, lightheadedness then inject epinephrine and seek immediate medical care afterwards.   Return in about 2 months (around 03/07/2021).  Assessment and Plan: Michelle [name] a [age].o. female with: No problem-specific Assessment & Plan notes found for this encounter.  No follow-ups on file.  No orders of the defined types were placed in this encounter.  Lab Orders  No laboratory test(s) ordered today    Diagnostics: Spirometry:  Tracings reviewed. Her effort: {Blank single:19197::"Good reproducible efforts.","It was hard to get consistent efforts and there is a question as to whether this reflects a maximal maneuver.","Poor effort, data can not be interpreted."} FVC: ***L FEV1: ***L, ***% predicted FEV1/FVC ratio: ***% Interpretation: {Blank single:19197::"Spirometry consistent with mild obstructive disease","Spirometry consistent  with moderate obstructive disease","Spirometry consistent with severe obstructive disease","Spirometry consistent with possible restrictive disease","Spirometry consistent with  mixed obstructive and restrictive disease","Spirometry uninterpretable due to technique","Spirometry consistent with normal pattern","No overt abnormalities noted given today's efforts"}.  Please see scanned spirometry results for details.  Skin Testing: {Blank single:19197::"Select foods","Environmental allergy panel","Environmental allergy panel and select foods","Food allergy panel","None","Deferred due to recent antihistamines use"}. *** Results discussed with patient/family.   Medication List:  Current Outpatient Medications  Medication Sig Dispense Refill   albuterol (VENTOLIN HFA) 108 (90 Base) MCG/ACT inhaler INHALE 2 PUFFS INTO THE LUNGS EVERY 4 HOURS AS NEEDED FOR WHEEZING OR SHORTNESS OF BREATH (Patient taking differently: Inhale 2 puffs into the lungs every 4 (four) hours as needed for wheezing.) 8.5 g 2   cetirizine (ZYRTEC) 10 MG tablet TAKE 2 TABLETS(20 MG) BY MOUTH TWICE DAILY 120 tablet 5   EPINEPHrine (EPIPEN 2-PAK) 0.3 mg/0.3 mL IJ SOAJ injection Inject 0.3 mg into the muscle once as needed for anaphylaxis. 2 each 1   famotidine (PEPCID) 20 MG tablet Take 1 tablet (20 mg total) by mouth 2 (two) times daily. 60 tablet 5   Multiple Vitamin (MULTIVITAMIN ADULT PO) Take by mouth.     Current Facility-Administered Medications  Medication Dose Route Frequency Provider Last Rate Last Admin   diphenhydrAMINE (BENADRYL) injection 25 mg  25 mg Intramuscular Once Michelle Clines R, DO       metoCLOPramide (REGLAN) injection 10 mg  10 mg Intramuscular Once Michelle Clines R, DO       Allergies: Allergies  Allergen Reactions   Adhesive [Tape] Hives and Other (See Comments)    EKG or heart monitor pads- caused raised, red areas (WELTS) on the skin; "sensitive" pads fell off   Bee Venom Hives and Other (See Comments)    Welts, also   I reviewed her past medical history, social history, family history, and environmental history and no significant changes have been reported from her previous  visit.  Review of Systems  Constitutional:  Negative for appetite change, chills, fever and unexpected weight change.  HENT:  Negative for congestion, rhinorrhea and sneezing.   Eyes:  Negative for itching.  Respiratory:  Negative for cough, chest tightness, shortness of breath and wheezing.   Cardiovascular:  Negative for chest pain.  Gastrointestinal:  Negative for abdominal pain.  Genitourinary:  Negative for difficulty urinating.  Skin:  Positive for rash.  Allergic/Immunologic: Negative for environmental allergies.  Neurological:  Negative for headaches.   Objective: LMP 08/14/2016 (Exact Date)  There is no height or weight on file to calculate BMI. Physical Exam Vitals and nursing note reviewed.  Constitutional:      Appearance: She is well-developed.  HENT:     Head: Normocephalic and atraumatic.     Right Ear: Tympanic membrane and external ear normal.     Left Ear: Tympanic membrane and external ear normal.     Nose: Nose normal.     Mouth/Throat:     Mouth: Mucous membranes are moist.  Eyes:     Conjunctiva/sclera: Conjunctivae normal.  Cardiovascular:     Rate and Rhythm: Normal rate and regular rhythm.     Heart sounds: Normal heart sounds. No murmur heard.   No friction rub. No gallop.  Pulmonary:     Effort: Pulmonary effort is normal.     Breath sounds: Normal breath sounds. No wheezing or rales.  Musculoskeletal:     Cervical back: Neck supple.  Skin:    General: Skin is  warm.     Findings: No rash.     Comments: No hives on exam but excoriations marks on lower extremities.  Neurological:     Mental Status: She is alert and oriented to person, place, and time.  Psychiatric:        Behavior: Behavior normal.   Previous notes and tests were reviewed. The plan was reviewed with the patient/family, and all questions/concerned were addressed.  It was my pleasure to see Michelle [name] today and participate in her care. Please feel free to contact me with any  questions or concerns.  Sincerely,  Rexene Alberts, DO Allergy & Immunology  [facility] office: [place] office: [phone]

## 2021-03-16 ENCOUNTER — Ambulatory Visit: Payer: Medicaid Other | Admitting: Allergy

## 2021-03-16 DIAGNOSIS — J4520 Mild intermittent asthma, uncomplicated: Secondary | ICD-10-CM

## 2021-03-16 DIAGNOSIS — Z91030 Bee allergy status: Secondary | ICD-10-CM

## 2021-03-16 DIAGNOSIS — L508 Other urticaria: Secondary | ICD-10-CM

## 2021-03-22 ENCOUNTER — Ambulatory Visit (HOSPITAL_COMMUNITY)
Admission: EM | Admit: 2021-03-22 | Discharge: 2021-03-22 | Disposition: A | Discharge status: Home / Self Care | Payer: Medicaid Other | Attending: Physician Assistant | Admitting: Physician Assistant

## 2021-03-22 ENCOUNTER — Encounter (HOSPITAL_COMMUNITY): Payer: Self-pay

## 2021-03-22 ENCOUNTER — Other Ambulatory Visit: Payer: Self-pay

## 2021-03-22 DIAGNOSIS — Z20822 Contact with and (suspected) exposure to covid-19: Secondary | ICD-10-CM | POA: Insufficient documentation

## 2021-03-22 NOTE — ED Triage Notes (Signed)
Pt reports fatigue, headache and nausea since this morning.  Pt reports 1 of the kids tested positive for COVID on 9/31/2022.

## 2021-03-22 NOTE — Discharge Instructions (Addendum)
Will notify of covid results once available. Use OTC symptomatic treatment, and continue with increased fluids and rest. Follow up sooner with any concerns or worsening symptoms.

## 2021-03-22 NOTE — ED Provider Notes (Signed)
[place]    CSN: [id number] Arrival date & time: 03/22/21  1322      History   Chief Complaint Chief Complaint  Patient presents with   Covid Exposure   Headache   Fatigue    HPI SHAQUALA CUNICO is a 30 y.o. female.   Patient is here today for evaluation of headache, nausea that started this morning.  Her son is at home with COVID.  She has not had any fevers or chills.  Denies any vomiting or diarrhea.  Has not had any nasal congestion, cough, shortness of breath or sore throat.  Has not taken any medication for treatment.  The history is provided by the patient.  Headache Associated symptoms: nausea   Associated symptoms: no abdominal pain, no congestion, no cough, no diarrhea, no ear pain, no fever, no sinus pressure, no sore throat and no vomiting    Past Medical History:  Diagnosis Date   Anxiety    Asthma    Bipolar disorder ([place])    no current med.   Depression    no current med.   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   Migraine    Nephrolithiasis    PTSD (post-traumatic stress disorder)    PTSD (post-traumatic stress disorder)    Pyelonephritis 01/16/2019   Seizures ([place])    Tachycardia    TMJ (dislocation of temporomandibular joint)    Vertigo     Patient Active Problem List   Diagnosis Date Noted   Dizziness 02/10/2021   Hypermobility syndrome 10/04/2020   Encounter for pre-operative examination 08/17/2020   Nipple discharge in female 06/04/2020   GERD (gastroesophageal reflux disease) 06/04/2020   Labral tear of right hip joint 04/12/2020   Right hip pain 02/03/2020   Finger pain,  right 10/23/2019   Eustachian tube dysfunction, bilateral 12/16/2018   Bee sting allergy 11/06/2018   Allergic reaction 11/04/2018   Mild intermittent asthma without complication [id number]   Chronic rhinitis 11/04/2018   Chronic urticaria 10/04/2018   Rash 11/16/2017   Easy bruising 11/16/2017   Anxiety 10/27/2016   SUI (stress urinary incontinence, female) 04/10/2016   Migraine 02/10/2016   Lipoma 08/23/2015   PTSD 03/02/2010   Tobacco use disorder 02/07/2008   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    Past Surgical History:  Procedure Laterality Date   ABDOMINAL HYSTERECTOMY     ADENOIDECTOMY, TONSILLECTOMY AND MYRINGOTOMY WITH TUBE PLACEMENT  1990's   APPENDECTOMY     CESAREAN SECTION  06/20/2012   Procedure: CESAREAN SECTION;  Surgeon: Emily Filbert, MD;  Location: [facility];  Service: Obstetrics;  Laterality: N/A;   HIP SURGERY     LAPAROSCOPIC APPENDECTOMY  07/25/2012   Procedure: APPENDECTOMY LAPAROSCOPIC;  Surgeon: Zenovia Jarred, MD;  Location: [facility];  Service: General;  Laterality: N/A;   LAPAROSCOPIC TUBAL LIGATION Bilateral 08/04/2014   Procedure: BILATERAL LAPAROSCOPIC TUBAL LIGATION;  Surgeon: Woodroe Mode, MD;  Location: [facility];  Service: Gynecology;  Laterality: Bilateral;   LIPOMA EXCISION N/A 09/16/2015   Procedure: EXCISION LIPOMA LUMBAR REGION;  Surgeon:  Donnie Mesa, MD;  Location: [facility];  Service: General;  Laterality: N/A;   TONSILLECTOMY     WISDOM TOOTH EXTRACTION      OB History     Gravida  3   Para  3   Term  2   Preterm  1   AB  0   Living  3      SAB  0   IAB  0   Ectopic  0   Multiple  0   Live Births  1        Obstetric Comments  C-Section Indication: Non-reassuring fetal tracing, growth delay & marked proteinuria & Pre-Eclampsia Eclamptic Seizure during C-section delivery          Home Medications    Prior to Admission medications   Medication Sig Start Date End Date Taking?  Authorizing Provider  albuterol (VENTOLIN HFA) 108 (90 Base) MCG/ACT inhaler INHALE 2 PUFFS INTO THE LUNGS EVERY 4 HOURS AS NEEDED FOR WHEEZING OR SHORTNESS OF BREATH Patient taking differently: Inhale 2 puffs into the lungs every 4 (four) hours as needed for wheezing. 06/27/19   Myles Gip, DO  cetirizine (ZYRTEC) 10 MG tablet TAKE 2 TABLETS(20 MG) BY MOUTH TWICE DAILY 01/05/21   Garnet Sierras, DO  EPINEPHrine (EPIPEN 2-PAK) 0.3 mg/0.3 mL IJ SOAJ injection Inject 0.3 mg into the muscle once as needed for anaphylaxis. 01/05/21   Garnet Sierras, DO  famotidine (PEPCID) 20 MG tablet Take 1 tablet (20 mg total) by mouth 2 (two) times daily. 01/05/21   Garnet Sierras, DO  Multiple Vitamin (MULTIVITAMIN ADULT PO) Take by mouth.    [provider]  fluticasone (FLONASE) 50 MCG/ACT nasal spray Place 1 spray into both nostrils daily. Patient not taking: No sig reported 01/06/19 08/06/20  Garnet Sierras, DO  nortriptyline (PAMELOR) 25 MG capsule Take 1 capsule (25 mg total) by mouth at bedtime. 04/09/20 08/06/20  [facility] Sprang, MD    Family History Family History  Problem Relation Age of Onset   Diabetes Mother    Anesthesia problems Mother        hard to wake up post-op   Atrial fibrillation Mother    Congestive Heart Failure Mother    Diabetes Father    Heart disease Father    Diabetes Sister    Anesthesia problems Sister        hard to wake up post-op   Colon cancer Maternal Grandfather    Breast cancer Paternal Grandmother    Colon cancer Paternal Grandfather    Colon polyps Maternal Aunt    Eczema Neg Hx    Allergic rhinitis Neg Hx    Asthma Neg Hx     Social History Social History   Tobacco Use   Smoking status: Former    Packs/day: 0.00    Years: 14.00    Pack years: 0.00    Types: Cigarettes    Quit date: 03/17/2017    Years since quitting: 4.0   Smokeless tobacco: Never  Vaping Use   Vaping Use: Some days  Substance Use Topics   Alcohol use: No   Drug use: No      Allergies   Adhesive [tape] and Bee venom   Review of Systems Review of Systems  Constitutional:  Negative for chills and fever.  HENT:  Negative for congestion, ear pain, sinus pressure and sore throat.   Eyes:  Negative for discharge and redness.  Respiratory:  Negative  for cough, shortness of breath and wheezing.   Gastrointestinal:  Positive for nausea. Negative for abdominal pain, diarrhea and vomiting.  Neurological:  Positive for headaches.    Physical Exam Triage Vital Signs ED Triage Vitals  Enc Vitals Group     BP 03/22/21 1511 125/81     Pulse Rate 03/22/21 1511 98     Resp 03/22/21 1511 18     Temp 03/22/21 1511 98.8 F (37.1 C)     Temp Source 03/22/21 1511 Oral     SpO2 03/22/21 1511 98 %     Weight 03/22/21 1512 160 lb 6.4 oz (72.8 kg)     Height --      Head Circumference --      Peak Flow --      Pain Score 03/22/21 1509 2     Pain Loc --      Pain Edu? --      Excl. in [place]? --    No data found.  Updated Vital Signs BP 125/81 (BP Location: Left Arm)   Pulse 98   Temp 98.8 F (37.1 C) (Oral)   Resp 18   Wt 160 lb 6.4 oz (72.8 kg)   LMP 08/14/2016 (Exact Date)   SpO2 98%   BMI 29.34 kg/m   Physical Exam Vitals and nursing note reviewed.  Constitutional:      General: She is not in acute distress.    Appearance: She is well-developed. She is not ill-appearing.  HENT:     Head: Normocephalic and atraumatic.  Cardiovascular:     Rate and Rhythm: Normal rate.  Pulmonary:     Effort: Pulmonary effort is normal.  Skin:    General: Skin is warm and dry.  Neurological:     Mental Status: She is alert.  Psychiatric:        Mood and Affect: Mood normal.        Behavior: Behavior normal.     UC Treatments / Results  Labs (all labs ordered are listed, but only abnormal results are displayed) Labs Reviewed  SARS CORONAVIRUS 2 (TAT 6-24 HRS)    EKG   Radiology No results found.  Procedures Procedures (including critical care  time)  Medications Ordered in UC Medications - No data to display  Initial Impression / Assessment and Plan / UC Course  I have reviewed the triage vital signs and the nursing notes.  Pertinent labs & imaging results that were available during my care of the patient were reviewed by me and considered in my medical decision making (see chart for details).  Will await COVID results.  Recommended over-the-counter symptomatic treatment if needed.  Encouraged sooner follow-up with any worsening or if symptoms fail to improve.   Final Clinical Impressions(s) / UC Diagnoses   Final diagnoses:  Close exposure to COVID-19 virus     Discharge Instructions      Will notify of covid results once available. Use OTC symptomatic treatment, and continue with increased fluids and rest. Follow up sooner with any concerns or worsening symptoms.      ED Prescriptions   None    PDMP not reviewed this encounter.   Francene Finders, PA-C 03/22/21 1555

## 2021-03-23 ENCOUNTER — Telehealth (HOSPITAL_COMMUNITY): Payer: Self-pay

## 2021-03-23 LAB — SARS CORONAVIRUS 2 (TAT 6-24 HRS): SARS Coronavirus 2: NEGATIVE

## 2021-04-14 ENCOUNTER — Other Ambulatory Visit: Payer: Self-pay

## 2021-04-14 ENCOUNTER — Ambulatory Visit
Admission: RE | Admit: 2021-04-14 | Discharge: 2021-04-14 | Disposition: A | Discharge status: Home / Self Care | Payer: Medicaid Other | Source: Ambulatory Visit

## 2021-04-14 VITALS — BP 124/83 | HR 98 | Temp 98.1°F | Resp 18

## 2021-04-14 DIAGNOSIS — B372 Candidiasis of skin and nail: Secondary | ICD-10-CM

## 2021-04-14 MED ORDER — NYSTATIN 100000 UNIT/GM EX CREA: TOPICAL_CREAM | CUTANEOUS | 0 refills | Status: DC

## 2021-04-14 NOTE — ED Provider Notes (Signed)
[name]    CSN: [id number] Arrival date & time: 04/14/21  0950      History   Chief Complaint Chief Complaint  Patient presents with   skin lesion to unbilical    HPI Michelle [name] is a 30 y.o. female.   Patient presents with pain and redness to the bellybutton that has been present for a few days.  Denies any drainage from the bellybutton.  Denies any fevers.  Denies any trauma to the bellybutton.  Patient did have bellybutton pierced in the past, but piercing has been been removed for over a year.  Patient denies that this has occurred before.    Past Medical History:  Diagnosis Date   Anxiety    Asthma    Bipolar disorder ([place])    no current med.   Depression    no current med.   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   Migraine    Nephrolithiasis    PTSD (post-traumatic stress disorder)    PTSD (post-traumatic stress disorder)    Pyelonephritis 01/16/2019   Seizures ([place])    Tachycardia    TMJ (dislocation of temporomandibular joint)    Vertigo     Patient Active Problem List   Diagnosis Date Noted   Dizziness 02/10/2021   Hypermobility syndrome 10/04/2020   Encounter for pre-operative examination 08/17/2020   Nipple discharge in female 06/04/2020   GERD (gastroesophageal reflux disease) 06/04/2020   Labral tear of right hip joint 04/12/2020   Right hip pain 02/03/2020   Finger pain, right 10/23/2019   Eustachian tube dysfunction, bilateral 12/16/2018   Bee sting allergy 11/06/2018   Allergic reaction 11/04/2018   Mild intermittent asthma without complication 40/98/1191   Chronic  rhinitis 11/04/2018   Chronic urticaria 10/04/2018   Rash 11/16/2017   Easy bruising 11/16/2017   Anxiety 10/27/2016   SUI (stress urinary incontinence, female) 04/10/2016   Migraine 02/10/2016   Lipoma 08/23/2015   PTSD 03/02/2010   Tobacco use disorder 02/07/2008   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    Past Surgical History:  Procedure Laterality Date   ABDOMINAL HYSTERECTOMY     ADENOIDECTOMY, TONSILLECTOMY AND MYRINGOTOMY WITH TUBE PLACEMENT  1990's   APPENDECTOMY     CESAREAN SECTION  06/20/2012   Procedure: CESAREAN SECTION;  Surgeon: Emily Filbert, MD;  Location: [facility];  Service: Obstetrics;  Laterality: N/A;   HIP SURGERY     LAPAROSCOPIC APPENDECTOMY  07/25/2012   Procedure: APPENDECTOMY LAPAROSCOPIC;  Surgeon: Zenovia Jarred, MD;  Location: [facility];  Service: General;  Laterality: N/A;   LAPAROSCOPIC TUBAL LIGATION Bilateral 08/04/2014   Procedure: BILATERAL LAPAROSCOPIC TUBAL LIGATION;  Surgeon: Woodroe Mode, MD;  Location: [facility];  Service: Gynecology;  Laterality: Bilateral;   LIPOMA EXCISION N/A 09/16/2015   Procedure: EXCISION LIPOMA LUMBAR REGION;  Surgeon: Donnie Mesa, MD;  Location: [facility];  Service: General;  Laterality: N/A;   TONSILLECTOMY     WISDOM TOOTH EXTRACTION      OB History     Gravida  3  Para  3   Term  2   Preterm  1   AB  0   Living  3      SAB  0   IAB  0   Ectopic  0   Multiple  0   Live Births  1        Obstetric Comments  C-Section Indication: Non-reassuring fetal tracing, growth delay & marked proteinuria & Pre-Eclampsia Eclamptic Seizure during C-section delivery          Home Medications    Prior to Admission medications   Medication Sig Start Date End Date Taking? Authorizing Provider  nystatin cream (MYCOSTATIN) Apply to affected area 2 times daily 04/14/21  Yes Odis Luster, FNP  albuterol (VENTOLIN HFA) 108 (90 Base) MCG/ACT inhaler INHALE 2 PUFFS INTO THE  LUNGS EVERY 4 HOURS AS NEEDED FOR WHEEZING OR SHORTNESS OF BREATH Patient taking differently: Inhale 2 puffs into the lungs every 4 (four) hours as needed for wheezing. 06/27/19   Myles Gip, DO  cetirizine (ZYRTEC) 10 MG tablet TAKE 2 TABLETS(20 MG) BY MOUTH TWICE DAILY 01/05/21   Garnet Sierras, DO  EPINEPHrine (EPIPEN 2-PAK) 0.3 mg/0.3 mL IJ SOAJ injection Inject 0.3 mg into the muscle once as needed for anaphylaxis. 01/05/21   Garnet Sierras, DO  famotidine (PEPCID) 20 MG tablet Take 1 tablet (20 mg total) by mouth 2 (two) times daily. 01/05/21   Garnet Sierras, DO  HYDROcodone-acetaminophen (NORCO/VICODIN) 5-325 MG tablet Take 1 tablet by mouth every 8 (eight) hours as needed. 03/14/21   [provider]  ketorolac (TORADOL) 10 MG tablet Take 10 mg by mouth every 6 (six) hours as needed. 03/14/21   [provider]  Multiple Vitamin (MULTIVITAMIN ADULT PO) Take by mouth.    [provider]  fluticasone (FLONASE) 50 MCG/ACT nasal spray Place 1 spray into both nostrils daily. Patient not taking: No sig reported 01/06/19 08/06/20  Garnet Sierras, DO  nortriptyline (PAMELOR) 25 MG capsule Take 1 capsule (25 mg total) by mouth at bedtime. 04/09/20 08/06/20  [facility] Sprang, MD    Family History Family History  Problem Relation Age of Onset   Diabetes Mother    Anesthesia problems Mother        hard to wake up post-op   Atrial fibrillation Mother    Congestive Heart Failure Mother    Diabetes Father    Heart disease Father    Diabetes Sister    Anesthesia problems Sister        hard to wake up post-op   Colon cancer Maternal Grandfather    Breast cancer Paternal Grandmother    Colon cancer Paternal Grandfather    Colon polyps Maternal Aunt    Eczema Neg Hx    Allergic rhinitis Neg Hx    Asthma Neg Hx     Social History Social History   Tobacco Use   Smoking status: Former    Packs/day: 0.00    Years: 14.00    Pack years: 0.00    Types: Cigarettes    Quit date:  03/17/2017    Years since quitting: 4.0   Smokeless tobacco: Never  Vaping Use   Vaping Use: Some days  Substance Use Topics   Alcohol use: No   Drug use: No     Allergies   Adhesive [tape] and Bee venom   Review of Systems Review of Systems Per HPI  Physical Exam Triage Vital Signs ED Triage Vitals [  04/14/21 1015]  Enc Vitals Group     BP 124/83     Pulse Rate 98     Resp 18     Temp 98.1 F (36.7 C)     Temp Source Oral     SpO2 98 %     Weight      Height      Head Circumference      Peak Flow      Pain Score 0     Pain Loc      Pain Edu?      Excl. in [place]?    No data found.  Updated Vital Signs BP 124/83 (BP Location: Left Arm)   Pulse 98   Temp 98.1 F (36.7 C) (Oral)   Resp 18   LMP 08/14/2016 (Exact Date)   SpO2 98%   Visual Acuity Right Eye Distance:   Left Eye Distance:   Bilateral Distance:    Right Eye Near:   Left Eye Near:    Bilateral Near:     Physical Exam Constitutional:      General: She is not in acute distress.    Appearance: She is not toxic-appearing or diaphoretic.  HENT:     Head: Normocephalic.  Eyes:     Extraocular Movements: Extraocular movements intact.     Conjunctiva/sclera: Conjunctivae normal.     Pupils: Pupils are equal, round, and reactive to light.  Cardiovascular:     Rate and Rhythm: Normal rate and regular rhythm.     Pulses: Normal pulses.     Heart sounds: Normal heart sounds.  Pulmonary:     Effort: Pulmonary effort is normal. No respiratory distress.     Breath sounds: Normal breath sounds.  Abdominal:     Comments: Erythema noted inside umbilicus.  No drainage noted.  No lesions noted.  No erythema surrounding umbilicus.  Skin:    General: Skin is warm and dry.  Neurological:     General: No focal deficit present.     Mental Status: She is alert and oriented to person, place, and time. Mental status is at baseline.     UC Treatments / Results  Labs (all labs ordered are listed, but only  abnormal results are displayed) Labs Reviewed - No data to display  EKG   Radiology No results found.  Procedures Procedures (including critical care time)  Medications Ordered in UC Medications - No data to display  Initial Impression / Assessment and Plan / UC Course  I have reviewed the triage vital signs and the nursing notes.  Pertinent labs & imaging results that were available during my care of the patient were reviewed by me and considered in my medical decision making (see chart for details).     Erythema to umbilicus could be related to candidal skin infection or bacterial infection.  Will treat with nystatin cream to treat for yeast.  Also advised patient to use Neosporin cream to cover for bacterial infection.  Patient to follow-up if symptoms persist.  No red flags seen on exam that would require immediate treatment at the hospital at this time or immediate follow-up.Discussed strict return precautions. Patient verbalized understanding and is agreeable with plan.  Final Clinical Impressions(s) / UC Diagnoses   Final diagnoses:  Candidal skin infection     Discharge Instructions      The discomfort in your bellybutton can be a result of bacterial infection or yeast infection of the skin.  You have been prescribed nystatin  cream that will treat yeast infection.  Please also use Neosporin antibiotic cream that you can get over-the-counter to help treat bacterial infection.  Follow-up with primary care or urgent care if symptoms persist.     ED Prescriptions     Medication Sig Dispense Auth. Provider   nystatin cream (MYCOSTATIN) Apply to affected area 2 times daily 30 g Odis Luster, FNP      PDMP not reviewed this encounter.   Odis Luster, FNP 04/14/21 1103

## 2021-04-14 NOTE — Discharge Instructions (Addendum)
The discomfort in your bellybutton can be a result of bacterial infection or yeast infection of the skin.  You have been prescribed nystatin cream that will treat yeast infection.  Please also use Neosporin antibiotic cream that you can get over-the-counter to help treat bacterial infection.  Follow-up with primary care or urgent care if symptoms persist.

## 2021-04-14 NOTE — ED Triage Notes (Signed)
Pt c/o erythema and edema to belly button. Onset about 5 days ago. States has old piercing in the area that she has not used in several years. States tried cleaning with soap and water at home without relief. Denies discharge. States it hurts and sometimes stings.

## 2021-04-18 DIAGNOSIS — Q7962 Hypermobile Ehlers-Danlos syndrome: Secondary | ICD-10-CM | POA: Insufficient documentation

## 2021-04-18 DIAGNOSIS — R21 Rash and other nonspecific skin eruption: Secondary | ICD-10-CM | POA: Diagnosis not present

## 2021-04-18 DIAGNOSIS — Q796 Ehlers-Danlos syndrome, unspecified: Secondary | ICD-10-CM | POA: Insufficient documentation

## 2021-04-18 DIAGNOSIS — D229 Melanocytic nevi, unspecified: Secondary | ICD-10-CM | POA: Diagnosis not present

## 2021-04-27 ENCOUNTER — Encounter: Payer: Self-pay | Admitting: *Deleted

## 2021-05-12 DIAGNOSIS — R222 Localized swelling, mass and lump, trunk: Secondary | ICD-10-CM | POA: Diagnosis not present

## 2021-05-13 DIAGNOSIS — D179 Benign lipomatous neoplasm, unspecified: Secondary | ICD-10-CM | POA: Diagnosis not present

## 2021-05-13 DIAGNOSIS — Z86018 Personal history of other benign neoplasm: Secondary | ICD-10-CM | POA: Diagnosis not present

## 2021-05-13 DIAGNOSIS — R222 Localized swelling, mass and lump, trunk: Secondary | ICD-10-CM | POA: Diagnosis not present

## 2021-07-12 ENCOUNTER — Other Ambulatory Visit: Payer: Self-pay | Admitting: Allergy

## 2021-07-20 NOTE — Patient Instructions (Addendum)
Chronic urticaria Continue zyrtec (cetirizine) 64m 2 tablets twice a day. If hives are not controlled or causes drowsiness let u[place]oreaknow. Continue pepcid (famotidine) 265mtwice a day.  Avoid the following potential triggers: alcohol, tight clothing, NSAIDs, hot showers and getting overheated. Continue to consider Xolair injections. Information given along with side effects -Dr. KiMaudie Mercuryiscussed with patient at her last office visit that her tryptase level was borderline positive and with normal c kit there is low likelihood of having a mast cell disease. Even if she had a mast cell issue the initial treatment is antihistamines which she is on already.   At your request we will refer you to:  [facility] [place] [id number]: [name] Professor of Pediatrics & Internal Medicine Division of Pediatric Allergy & Immunology Phone: [phone]: [phone]  Mild intermittent asthma May use albuterol rescue inhaler 2 puffs every 4 to 6 hours as needed for shortness of breath, chest tightness, coughing, and wheezing. Monitor frequency of use. Asthma control goals:  Full participation in all desired activities (may need albuterol before activity) Albuterol use two times or less a week on average (not counting use with activity) Cough interfering with sleep two times or less a month Oral steroids no more than once a year No hospitalization  Bee sting  Avoid stinging insects. For mild symptoms you can take over the counter antihistamines such as Benadryl and monitor symptoms closely. If symptoms worsen or if you have severe symptoms including breathing issues, throat closure, significant swelling, whole body hives, severe diarrhea and vomiting, lightheadedness then inject epinephrine and seek immediate medical care afterwards.  Rash on right hand Continue with  moisturization Try over the counter hydrocortisone cream and if it does not get better contact your dermatologist. If your symptoms re-occur, begin a journal of events that occurred for up to 6 hours before your symptoms began including foods and beverages consumed, soaps or perfumes you had contact with, and medications.    Follow up in 2 months or sooner if needed

## 2021-07-21 ENCOUNTER — Encounter: Payer: Self-pay | Admitting: Family

## 2021-07-21 ENCOUNTER — Other Ambulatory Visit: Payer: Self-pay

## 2021-07-21 ENCOUNTER — Ambulatory Visit (INDEPENDENT_AMBULATORY_CARE_PROVIDER_SITE_OTHER): Payer: Medicaid Other | Admitting: Family

## 2021-07-21 VITALS — BP 110/60 | HR 88 | Temp 98.3°F | Resp 12 | Ht 62.0 in | Wt 159.0 lb

## 2021-07-21 DIAGNOSIS — L508 Other urticaria: Secondary | ICD-10-CM | POA: Diagnosis not present

## 2021-07-21 DIAGNOSIS — R21 Rash and other nonspecific skin eruption: Secondary | ICD-10-CM | POA: Diagnosis not present

## 2021-07-21 DIAGNOSIS — J4520 Mild intermittent asthma, uncomplicated: Secondary | ICD-10-CM

## 2021-07-21 DIAGNOSIS — Z91030 Bee allergy status: Secondary | ICD-10-CM | POA: Diagnosis not present

## 2021-07-21 MED ORDER — ALBUTEROL SULFATE HFA 108 (90 BASE) MCG/ACT IN AERS: 2.0000 | INHALATION_SPRAY | RESPIRATORY_TRACT | 1 refills | Status: DC | PRN

## 2021-07-21 MED ORDER — CETIRIZINE HCL 10 MG PO TABS: ORAL_TABLET | ORAL | 5 refills | Status: DC

## 2021-07-21 MED ORDER — FAMOTIDINE 20 MG PO TABS: 20.0000 mg | ORAL_TABLET | Freq: Two times a day (BID) | ORAL | 5 refills | Status: DC

## 2021-07-21 NOTE — Progress Notes (Signed)
[address] [place] [address] Dept: [phone]  FOLLOW UP NOTE  Patient ID: Michelle [name], female    DOB: [birth year]  Age: 31 y.o. MRN: [id number] Date of Office Visit: 07/21/2021  Assessment  Chief Complaint: Follow-up, Medication Refill, Urticaria (Right hand was covered in tiny little itchy dots about a week ago. Doesn't burn anymore and it only itches at night now. ), and Asthma (It is ok right now./ACT 23)  HPI Michelle [name] is a 31 year old female who presents today for follow-up of chronic urticaria, mild intermittent asthma without complication, and bee sting allergy.  She was last seen on January 05, 2021 by Dr. Maudie Mercury.  Since her last office visit she reports that she was diagnosed with psoriasis approximately 3 to 4 months ago.  Chronic urticaria is reported as moderately controlled with Zyrtec 10 mg 2 tablets twice a day and famotidine 20 mg twice a day.  She reports that she will occasionally get hives  on her legs and they will last not even a whole day to a few days.  She has thought about Xolair but is "iffy" right now due to one of the side effects saying that it could cause cancer.  She also read on either the Xolair or one of her medications for psoriasis that it could dry out organs and she does not want to risk this with her kidneys.  Discussed how this was not a side effect of Xolair, but did give another pamphlet discussing the possible side effects/reactions.  .  She reports that she was doing some reading from where she has the diagnosis of Ehlers-Danlos and its correlation with mast cells. She would like to proceed with the referral to the mast cell specialist in [place].  Mild intermittent asthma is reported as controlled with albuterol as needed.  She reports very rare shortness of breath and denies coughing, wheezing, tightness in chest, and nocturnal awakenings due to breathing problems.  Since her last office visit she has not required any systemic  steroids and has not made any trips to the emergency room or urgent care due to breathing problems.  The last time she used her albuterol was mid November when one of her children was in the hospital.  Since her last office visit she denies any insect stings and has not had to use her EpiPen.  She recently received a new EpiPen this summer.  Last Thursday she was deboning chicken and the next day she noticed tiny bumps that itch and kind of burn on the dorsal aspect of both hands.  She denies any concomitant cardiorespiratory and gastrointestinal symptoms.  Her left hand is now cleared up but her right hand still has the bumps and they are just occasionally itchy.  She has tried using triamcinolone cream for 2 days that was prescribed for her psoriasis and it caused burning on day 2 so she stopped.  She mentions that something similar to this happened last year when she was deboning chicken also. The episode last year was not as severe and it cleared up within a week.  She denies any new products, no new foods, no one else has a rash, no new medications, no bruising, joint pain, fever, or chills.   Drug Allergies:  Allergies  Allergen Reactions   Adhesive [Tape] Hives and Other (See Comments)    EKG or heart monitor pads- caused raised, red areas (WELTS) on the skin; "sensitive" pads fell off   Bee Venom  Hives and Other (See Comments)    Welts, also    Review of Systems: Review of Systems  Constitutional:  Negative for chills and fever.  HENT:         Denies rhinorrhea, nasal congestion, and postnasal drip  Eyes:        Denies itchy watery eyes  Respiratory:  Negative for cough, shortness of breath and wheezing.   Cardiovascular:  Negative for chest pain and palpitations.  Gastrointestinal:        Denies heartburn or reflux symptoms  Skin:  Positive for itching and rash.  Neurological:  Negative for headaches.    Physical Exam: BP 110/60    Pulse 88    Temp 98.3 F (36.8 C)  (Temporal)    Resp 12    Ht '5\' 2"'  (1.575 m)    Wt 159 lb (72.1 kg)    LMP 08/14/2016 (Exact Date)    SpO2 97%    BMI 29.08 kg/m    Physical Exam Constitutional:      Appearance: Normal appearance.  HENT:     Head: Normocephalic and atraumatic.     Comments: Pharynx normal, eyes normal, ears normal, nose: Bilateral lower turbinates moderately edematous with clear drainage noted.  Left turbinate greater than right turbinate    Right Ear: Tympanic membrane, ear canal and external ear normal.     Left Ear: Tympanic membrane, ear canal and external ear normal.     Mouth/Throat:     Mouth: Mucous membranes are moist.     Pharynx: Oropharynx is clear.  Eyes:     Conjunctiva/sclera: Conjunctivae normal.  Cardiovascular:     Rate and Rhythm: Normal rate and regular rhythm.     Heart sounds: Normal heart sounds.  Pulmonary:     Effort: Pulmonary effort is normal.     Breath sounds: Normal breath sounds.     Comments: Lungs clear to auscultation Musculoskeletal:     Cervical back: Neck supple.  Skin:    General: Skin is warm.     Comments: Fine papular rash that is slightly erythematous noted on dorsal aspect of right hand.  No urticarial lesions noted  Neurological:     Mental Status: She is alert and oriented to person, place, and time.  Psychiatric:        Mood and Affect: Mood normal.        Behavior: Behavior normal.        Thought Content: Thought content normal.        Judgment: Judgment normal.    Diagnostics: FVC 3.58 L, FEV1 2.95 L.  Predicted FVC 3.52 L, predicted FEV1 2.97 L.  Spirometry indicates normal respiratory function  Assessment and Plan: 1. Mild intermittent asthma without complication   2. Chronic urticaria   3. Bee sting allergy   4. Rash     Meds ordered this encounter  Medications   cetirizine (ZYRTEC) 10 MG tablet    Sig: TAKE 2 TABLETS(20 MG) BY MOUTH TWICE DAILY    Dispense:  120 tablet    Refill:  5   famotidine (PEPCID) 20 MG tablet    Sig:  Take 1 tablet (20 mg total) by mouth 2 (two) times daily.    Dispense:  60 tablet    Refill:  5   albuterol (VENTOLIN HFA) 108 (90 Base) MCG/ACT inhaler    Sig: Inhale 2 puffs into the lungs every 4 (four) hours as needed for wheezing or shortness of breath.    Dispense:  18 g    Refill:  1    Patient Instructions  Chronic urticaria Continue zyrtec (cetirizine) 79m 2 tablets twice a day. If hives are not controlled or causes drowsiness let [place]now. Continue pepcid (famotidine) 266mtwice a day.  Avoid the following potential triggers: alcohol, tight clothing, NSAIDs, hot showers and getting overheated. Continue to consider Xolair injections. Information given along with side effects -Dr. KiMaudie Mercuryiscussed with patient at her last office visit that her tryptase level was borderline positive and with normal c kit there is low likelihood of having a mast cell disease. Even if she had a mast cell issue the initial treatment is antihistamines which she is on already.   At your request we will refer you to:  [place] [place] [id number]: [name] Professor of Pediatrics & Internal Medicine Division of Pediatric Allergy & Immunology Phone: [phone]: [phone]  Mild intermittent asthma May use albuterol rescue inhaler 2 puffs every 4 to 6 hours as needed for shortness of breath, chest tightness, coughing, and wheezing. Monitor frequency of use. Asthma control goals:  Full participation in all desired activities (may need albuterol before activity) Albuterol use two times or less a week on average (not counting use with activity) Cough interfering with sleep two times or less a month Oral steroids no more than once a year No hospitalization  Bee sting  Avoid stinging insects. For mild symptoms you can take over the counter antihistamines such as Benadryl  and monitor symptoms closely. If symptoms worsen or if you have severe symptoms including breathing issues, throat closure, significant swelling, whole body hives, severe diarrhea and vomiting, lightheadedness then inject epinephrine and seek immediate medical care afterwards.  Rash on right hand Continue with moisturization Try over the counter hydrocortisone cream and if it does not get better contact your dermatologist. If your symptoms re-occur, begin a journal of events that occurred for up to 6 hours before your symptoms began including foods and beverages consumed, soaps or perfumes you had contact with, and medications.    Follow up in 2 months or sooner if needed  Return in about 2 months (around 09/18/2021), or if symptoms worsen or fail to improve.    Thank you for the opportunity to care for this patient.  Please do not hesitate to contact me with questions.  ChAlthea CharonFNP [facility] [place]

## 2021-07-24 ENCOUNTER — Encounter (HOSPITAL_COMMUNITY): Payer: Self-pay | Admitting: Emergency Medicine

## 2021-07-24 ENCOUNTER — Emergency Department (HOSPITAL_COMMUNITY)
Admission: EM | Admit: 2021-07-24 | Discharge: 2021-07-24 | Disposition: A | Discharge status: Home / Self Care | Payer: Medicaid Other | Attending: Emergency Medicine | Admitting: Emergency Medicine

## 2021-07-24 DIAGNOSIS — S4992XA Unspecified injury of left shoulder and upper arm, initial encounter: Secondary | ICD-10-CM | POA: Diagnosis present

## 2021-07-24 DIAGNOSIS — S40812A Abrasion of left upper arm, initial encounter: Secondary | ICD-10-CM | POA: Diagnosis not present

## 2021-07-24 DIAGNOSIS — T148XXA Other injury of unspecified body region, initial encounter: Secondary | ICD-10-CM

## 2021-07-24 DIAGNOSIS — S50812A Abrasion of left forearm, initial encounter: Secondary | ICD-10-CM | POA: Diagnosis not present

## 2021-07-24 DIAGNOSIS — Y9289 Other specified places as the place of occurrence of the external cause: Secondary | ICD-10-CM | POA: Insufficient documentation

## 2021-07-24 DIAGNOSIS — W541XXA Struck by dog, initial encounter: Secondary | ICD-10-CM | POA: Insufficient documentation

## 2021-07-24 DIAGNOSIS — R000 Tachycardia, unspecified: Secondary | ICD-10-CM | POA: Insufficient documentation

## 2021-07-24 NOTE — ED Triage Notes (Signed)
Pt states she she was scratched by a stray dog on 12/28. Worried about rabies after a tiktok she saw.

## 2021-07-24 NOTE — Discharge Instructions (Signed)
As we discussed I have no clinical concern that you have rabies infection.  Your wound also does not appear infected.  Continue to monitor for signs of infection, and keep the wound clean.  In the future if you are concerned about rabies exposure with an animal, please present to the emergency department immediately, however to reassure you that the incidence of rabies in [facility] is nearly 0, and any animal that has been through the shelter system at any point has been vaccinated against rabies.  If you encounter a aggressive animal that is frothing at the mouth, appears unkempt, and has no record of vaccinations, then you may present for evaluation for potential rabies infection.  I hope that you feel better soon, it was a pleasure to meet you today.

## 2021-07-24 NOTE — ED Provider Notes (Signed)
[facility] EMERGENCY DEPARTMENT Provider Note   CSN: [id number] Arrival date & time: 07/24/21  1647     History  No chief complaint on file.   Michelle [name] is a 31 y.o. female with no significant past medical history who presents for concern about a scratch on her left arm that she sustained from a puppy that she was playing with in the woods on 12/28.  Is actually unsure whether she sustained a scratch from the puppy or her cat.  Patient reports that wound is healing well, she has not felt abnormal, however she saw a TikTok that said something about rabies and so she has now been worried that she may have rabies.  Patient reports that other than feeling anxious about her symptoms she has not felt any abnormal symptoms, purulent drainage from the wound, or other health concerns.  HPI     Home Medications Prior to Admission medications   Medication Sig Start Date End Date Taking? Authorizing Provider  albuterol (VENTOLIN HFA) 108 (90 Base) MCG/ACT inhaler Inhale 2 puffs into the lungs every 4 (four) hours as needed for wheezing or shortness of breath. 07/21/21   Althea Charon, FNP  cetirizine (ZYRTEC) 10 MG tablet TAKE 2 TABLETS(20 MG) BY MOUTH TWICE DAILY 07/21/21   Althea Charon, FNP  EPINEPHrine (EPIPEN 2-PAK) 0.3 mg/0.3 mL IJ SOAJ injection Inject 0.3 mg into the muscle once as needed for anaphylaxis. 01/05/21   Garnet Sierras, DO  famotidine (PEPCID) 20 MG tablet Take 1 tablet (20 mg total) by mouth 2 (two) times daily. 07/21/21   Althea Charon, FNP  HYDROcodone-acetaminophen (NORCO/VICODIN) 5-325 MG tablet Take 1 tablet by mouth every 8 (eight) hours as needed. Patient not taking: Reported on 07/21/2021 03/14/21   [provider]  ketorolac (TORADOL) 10 MG tablet Take 10 mg by mouth every 6 (six) hours as needed. Patient not taking: Reported on 07/21/2021 03/14/21   [provider]  Multiple Vitamin (MULTIVITAMIN ADULT PO) Take by mouth. Patient not  taking: Reported on 07/21/2021    [provider]  nystatin cream (MYCOSTATIN) Apply to affected area 2 times daily 04/14/21   Teodora Medici, FNP  triamcinolone cream (KENALOG) 0.1 % Apply topically. 04/18/21 04/18/22  [provider]  fluticasone (FLONASE) 50 MCG/ACT nasal spray Place 1 spray into both nostrils daily. Patient not taking: No sig reported 01/06/19 08/06/20  Garnet Sierras, DO  nortriptyline (PAMELOR) 25 MG capsule Take 1 capsule (25 mg total) by mouth at bedtime. 04/09/20 08/06/20  Cameron Sprang, MD      Allergies    Adhesive [tape] and Bee venom    Review of Systems   Review of Systems  Skin:  Positive for wound.  All other systems reviewed and are negative.  Physical Exam Updated Vital Signs BP (!) 134/101 (BP Location: Right Arm)    Pulse (!) 121    Temp 98.1 F (36.7 C) (Oral)    Resp 17    LMP 08/14/2016 (Exact Date)    SpO2 100%  Physical Exam Vitals and nursing note reviewed.  Constitutional:      General: She is not in acute distress.    Appearance: Normal appearance.  HENT:     Head: Normocephalic and atraumatic.  Eyes:     General:        Right eye: No discharge.        Left eye: No discharge.  Cardiovascular:     Rate and Rhythm:  Regular rhythm. Tachycardia present.     Comments: Patient with mild tachycardia, however normal rhythm, and she reports that she feels anxious Pulmonary:     Effort: Pulmonary effort is normal. No respiratory distress.  Musculoskeletal:        General: No deformity.  Skin:    General: Skin is warm and dry.     Comments: Patient with appropriately healing small linear scratch with no dehiscence, or deep wound on the dorsal left forearm.  Neurological:     Mental Status: She is alert and oriented to person, place, and time.  Psychiatric:        Mood and Affect: Mood normal.        Behavior: Behavior normal.    ED Results / Procedures / Treatments   Labs (all labs ordered are listed, but only abnormal  results are displayed) Labs Reviewed - No data to display  EKG None  Radiology No results found.  Procedures Procedures    Medications Ordered in ED Medications - No data to display  ED Course/ Medical Decision Making/ A&P                           Medical Decision Making  This is an overall well-appearing patient who presents with concern for rabies after sustaining a dog scratch from a dog on 12/28.  Patient does not know the dog's vaccination status, but reports that it was not overly aggressive, rabid looking.  She also reports that it may have been a scratch from her cat.  On my evaluation the wound is appropriately healing linear scratch with no evidence of deeper injury.  She has no signs or symptoms of rabies, and we discussed that there is an extremely low incidence of rabies in the county, as well as she would have developed significant worsening symptoms at this time.  In the future discussed if she has concern for rabies she needs to come in for immediate evaluation after contact with an animal.  Patient discharged in stable condition at this time, return precautions given. Final Clinical Impression(s) / ED Diagnoses Final diagnoses:  Scratch    Rx / DC Orders ED Discharge Orders     None         Dorien Chihuahua 07/24/21 2050    Carmin Muskrat, MD 07/26/21 [id number]

## 2021-07-25 ENCOUNTER — Telehealth: Payer: Self-pay

## 2021-07-25 ENCOUNTER — Other Ambulatory Visit: Payer: Self-pay | Admitting: Family

## 2021-07-25 NOTE — Telephone Encounter (Signed)
Prescription was sent already on 07/21/2021

## 2021-07-25 NOTE — Telephone Encounter (Signed)
Transition Care Management Unsuccessful Follow-up Telephone Call  Date of discharge and from where:  07/24/2021 from [facility]  Attempts:  1st Attempt  Reason for unsuccessful TCM follow-up call:  Left voice message

## 2021-07-26 NOTE — Telephone Encounter (Signed)
Transition Care Management Follow-up Telephone Call Date of discharge and from where: 07/24/2021 from [facility] How have you been since you were released from the hospital? Pt stated that she is feeling better and did not have any questions or concerns.  Any questions or concerns? No  Items Reviewed: Did the pt receive and understand the discharge instructions provided? Yes  Medications obtained and verified? Yes  Other? No  Any new allergies since your discharge? No  Dietary orders reviewed? No Do you have support at home? Yes   Functional Questionnaire: (I = Independent and D = Dependent) ADLs: I  Bathing/Dressing- I  Meal Prep- I  Eating- I  Maintaining continence- I  Transferring/Ambulation- I  Managing Meds- I   Follow up appointments reviewed:  PCP Hospital f/u appt confirmed? No   Specialist Hospital f/u appt confirmed? No   Are transportation arrangements needed? No  If their condition worsens, is the pt aware to call PCP or go to the Emergency Dept.? Yes Was the patient provided with contact information for the PCP's office or ED? Yes Was to pt encouraged to call back with questions or concerns? Yes

## 2021-07-28 NOTE — Progress Notes (Signed)
Called to discuss the referral with the patient. She is already scheduled for 11/23/2021. They did inform her that they can not take her insurance due to it being [place] Medicaid. Patient is okay with being Self Pay at their office. Patients Notes & Labs have been faxed to the fax number provided.

## 2021-08-11 DIAGNOSIS — N200 Calculus of kidney: Secondary | ICD-10-CM | POA: Diagnosis not present

## 2021-08-30 ENCOUNTER — Other Ambulatory Visit: Payer: Self-pay

## 2021-08-30 ENCOUNTER — Encounter: Payer: Self-pay | Admitting: Family Medicine

## 2021-08-30 ENCOUNTER — Ambulatory Visit (INDEPENDENT_AMBULATORY_CARE_PROVIDER_SITE_OTHER): Payer: Medicaid Other | Admitting: Family Medicine

## 2021-08-30 VITALS — BP 116/73 | HR 122 | Ht 62.0 in | Wt 156.8 lb

## 2021-08-30 DIAGNOSIS — K5900 Constipation, unspecified: Secondary | ICD-10-CM

## 2021-08-30 HISTORY — DX: Constipation, unspecified: K59.00

## 2021-08-30 MED ORDER — POLYETHYLENE GLYCOL 3350 17 GM/SCOOP PO POWD: 17.0000 g | Freq: Every day | ORAL | 0 refills | Status: DC

## 2021-08-30 NOTE — Progress Notes (Addendum)
° ° °  SUBJECTIVE:   CHIEF COMPLAINT / HPI:   Patient presents wanting to check her hormones, she has had back acne for years and sees a dermatologist. Dermatologist placed on a regimen but that did not resolve the symptoms. Flare-ups have improved. Hair has been thinning out since her hystectomy in Feb 2018. She attributes a lot of these changes to after her hystectomy which makes her believe it may be related to her hormones. Also endorsing weight gain, she has been following a keto diet and lost weight initially then gained the weight back. These symptoms have not changed since 2019.   Sometimes not having a BM for a week, endorsing occasional abdominal pain after 3-4 days without BM but not currently. Uses suppository which helps. Does not take any daily medications regarding this. Denies any history of colon cancer in her family. Last BM was this morning, occasional straining.   OBJECTIVE:   BP 116/73    Pulse (!) 122    Ht 5\' 2"  (1.575 m)    Wt 156 lb 12.8 oz (71.1 kg)    LMP 08/14/2016 (Exact Date)    SpO2 98%    BMI 28.68 kg/m   General: Patient well-appearing, in no acute distress. HEENT: non-tender thyroid, even hair growth along scalp without patches of hair loss noted  CV: RRR, no murmurs or gallops auscultated Resp: CTAB, no wheezing, rales or rhonchi Abdomen: soft, nontender, nondistended, presence of bowel sounds Derm: few papules ranging no more than 0.5 cm in diameter noted along upper and lower back diffusely without particular dispersion pattern, no central umbilication or drainage noted, appear to be in healing stage  ASSESSMENT/PLAN:   Constipation -encouraged to increase daily fiber intake -miralax prescribed and medication use instructions provided with goal of 1 soft, BM every 1-2 days without straining  -colonoscopy screening at 31 years old as patient is not at high risk without family history or hematochezia    Last TSH wnl, unclear as the etiology of symptoms.  Encouraged to eat balanced diet and maintain physical activity. No indication to check any labs or hormones at this time.  Maintain hygiene and avoid oily moisturizers along back to prevent new areas of acne, continue follow up with dermatology as appropriate.  Discouraged against herbal supplementation given possible adverse effects and a great deal of unknown regarding these products.   -PHQ-9 score of 0 with negative question 9 reviewed.   Donney Dice, [place]

## 2021-08-30 NOTE — Patient Instructions (Addendum)
It was great seeing you today!  Today we discussed your symptoms, we checked your thyroid function recently and it was normal. I would continue to keep the back clean and try to avoid new acne forming. If this gets worse, please see the dermatologist as you were seeing before.   I have prescribed miralax, please use a capful daily and you may adjust accordingly with a goal of having 1 soft, bowel movement daily. Please make sure to increase your fiber intake.   Please follow up at your next scheduled appointment for a PAP smear, if anything arises between now and then, please don't hesitate to contact our office.   Thank you for allowing [place] to be a part of your medical care!  Thank you, Dr. Larae Grooms

## 2021-08-30 NOTE — Assessment & Plan Note (Signed)
-  encouraged to increase daily fiber intake -miralax prescribed and medication use instructions provided with goal of 1 soft, BM every 1-2 days without straining  -colonoscopy screening at 31 years old as patient is not at high risk without family history or hematochezia

## 2021-09-08 ENCOUNTER — Other Ambulatory Visit: Payer: Self-pay

## 2021-09-08 ENCOUNTER — Ambulatory Visit (INDEPENDENT_AMBULATORY_CARE_PROVIDER_SITE_OTHER): Payer: Medicaid Other | Admitting: Family Medicine

## 2021-09-08 ENCOUNTER — Encounter: Payer: Self-pay | Admitting: Family Medicine

## 2021-09-08 VITALS — BP 115/73 | HR 93 | Ht 62.0 in | Wt 152.6 lb

## 2021-09-08 DIAGNOSIS — R5383 Other fatigue: Secondary | ICD-10-CM | POA: Diagnosis not present

## 2021-09-08 DIAGNOSIS — L659 Nonscarring hair loss, unspecified: Secondary | ICD-10-CM | POA: Diagnosis not present

## 2021-09-08 NOTE — Progress Notes (Signed)
° °  SUBJECTIVE:   CHIEF COMPLAINT / HPI:    Michelle [name] is a 31 y.o. female here for fatigue and hair loss.  Patient reports her hair has been falling out since she had he patient reports being very tired and her hair coming out that she had her hysterectomy.  In 2018, hysterectomy was performed for abnormal uterine bleeding and fibroids.  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  Reports being fatigued all day even after drinking bang energy drinks.  She is not napping.  She does not feel rested in the morning.  She does not snore.  She exercises about 3 times a week.  Reports hair loss since her hysterectomy.  Previously was told by a dermatologist in [facility] that she has psoriasis.  PERTINENT  PMH / PSH: reviewed and updated as appropriate   OBJECTIVE:   BP 115/73    Pulse 93    Ht 5\' 2"  (1.575 m)    Wt 152 lb 9.6 oz (69.2 kg)    LMP 08/14/2016 (Exact Date)    SpO2 99%    BMI 27.91 kg/m    GEN:  female, in no acute distress  CV: regular rate and rhythm RESP: no increased work of breathing, clear to ascultation bilaterally ABD: Bowel sounds present. Soft, non-tender, non-distended, no palpable masses SKIN: warm, dry, no rash on visible skin PSYCH: Normal affect, appropriate speech and behavior    ASSESSMENT/PLAN:   Other fatigue Patient is a 32 year old female with ongoing fatigue for the past few years.  Etiology unclear.  Obtain TSH to evaluate for thyroid dysfunction.  CBC to evaluate for anemia.  Reports poor sleep.  No respiratory symptoms for now therefore we will exclude mononucleosis.  Per chart review, history of bipolar disorder and PTSD, could be related to her mental health.  Obtain CMP to evaluate for metabolic abnormalities.  Advised patient to follow-up with her gynecologist and endocrinologist to further hormonal aberrations.  Hair loss Patient was able to be scheduled in the dermatology clinic here at [facility] for  evaluation of hair loss.  Etiology of hair loss unclear.  Differential diagnosis includes post-COVID syndrome, hormonal dysfunction, psoriasis, trichotillomania, tinea capitis       Lyndee Hensen, DO PGY-3, [facility] 09/08/2021

## 2021-09-08 NOTE — Patient Instructions (Addendum)
Follow up in the dermatology clinic on 09/22/21 at 1:30 PM.   Stop by the pharmacy to pick up this system for your ear wax:       Regarding lab work today:  Due to recent changes in healthcare laws, you may see the results of your imaging and laboratory studies on MyChart before your provider has had a chance to review them.  I understand that in some cases there may be results that are confusing or concerning to you. Not all laboratory results come back in the same time frame and you may be waiting for multiple results in order to interpret others.  Please give [place] 72 hours in order for your provider to thoroughly review all the results before contacting the office for clarification of your results. If everything is normal, you will get a letter in the mail or a message in My Chart. Please give [place] a call if you do not hear from [place] after 2 weeks.  Please bring all of your medications with you to each visit.    Feel free to call with any questions or concerns at any time, at [phone].   Take care,  Dr. Rushie [facility]

## 2021-09-09 ENCOUNTER — Encounter: Payer: Self-pay | Admitting: Family Medicine

## 2021-09-09 DIAGNOSIS — R5383 Other fatigue: Secondary | ICD-10-CM

## 2021-09-09 DIAGNOSIS — Z8639 Personal history of other endocrine, nutritional and metabolic disease: Secondary | ICD-10-CM

## 2021-09-09 LAB — COMPREHENSIVE METABOLIC PANEL
ALT: 9 IU/L (ref 0–32)
AST: 16 IU/L (ref 0–40)
Albumin/Globulin Ratio: 2 (ref 1.2–2.2)
Albumin: 4.5 g/dL (ref 3.8–4.8)
Alkaline Phosphatase: 78 IU/L (ref 44–121)
BUN/Creatinine Ratio: 14 (ref 9–23)
BUN: 12 mg/dL (ref 6–20)
Bilirubin Total: <0.2 mg/dL (ref 0.0–1.2)
CO2: 22 mmol/L (ref 20–29)
Calcium: 9.6 mg/dL (ref 8.7–10.2)
Chloride: 104 mmol/L (ref 96–106)
Creatinine, Ser: 0.84 mg/dL (ref 0.57–1.00)
Globulin, Total: 2.3 g/dL (ref 1.5–4.5)
Glucose: 99 mg/dL (ref 70–99)
Potassium: 3.9 mmol/L (ref 3.5–5.2)
Sodium: 142 mmol/L (ref 134–144)
Total Protein: 6.8 g/dL (ref 6.0–8.5)
eGFR: 95 mL/min/{1.73_m2} (ref >59)

## 2021-09-09 LAB — CBC
Hematocrit: 39.3 % (ref 34.0–46.6)
Hemoglobin: 13.4 g/dL (ref 11.1–15.9)
MCH: 31 pg (ref 26.6–33.0)
MCHC: 34.1 g/dL (ref 31.5–35.7)
MCV: 91 fL (ref 79–97)
Platelets: 276 10*3/uL (ref 150–450)
RBC: 4.32 x10E6/uL (ref 3.77–5.28)
RDW: 11.8 % (ref 11.7–15.4)
WBC: 9.8 10*3/uL (ref 3.4–10.8)

## 2021-09-09 LAB — TSH+FREE T4
Free T4: 1.31 ng/dL (ref 0.82–1.77)
TSH: 0.902 u[IU]/mL (ref 0.450–4.500)

## 2021-09-11 DIAGNOSIS — R5383 Other fatigue: Secondary | ICD-10-CM

## 2021-09-11 DIAGNOSIS — L659 Nonscarring hair loss, unspecified: Secondary | ICD-10-CM

## 2021-09-11 HISTORY — DX: Other fatigue: R53.83

## 2021-09-11 HISTORY — DX: Nonscarring hair loss, unspecified: L65.9

## 2021-09-11 NOTE — Assessment & Plan Note (Signed)
Patient is a 31 year old female with ongoing fatigue for the past few years.  Etiology unclear.  Obtain TSH to evaluate for thyroid dysfunction.  CBC to evaluate for anemia.  Reports poor sleep.  No respiratory symptoms for now therefore we will exclude mononucleosis.  Per chart review, history of bipolar disorder and PTSD, could be related to her mental health.  Obtain CMP to evaluate for metabolic abnormalities.  Advised patient to follow-up with her gynecologist and endocrinologist to further hormonal aberrations.

## 2021-09-11 NOTE — Assessment & Plan Note (Signed)
Patient was able to be scheduled in the dermatology clinic here at [facility] for evaluation of hair loss.  Etiology of hair loss unclear.  Differential diagnosis includes post-COVID syndrome, hormonal dysfunction, psoriasis, trichotillomania, tinea capitis

## 2021-09-14 ENCOUNTER — Encounter (HOSPITAL_COMMUNITY): Payer: Self-pay | Admitting: *Deleted

## 2021-09-14 ENCOUNTER — Ambulatory Visit (HOSPITAL_COMMUNITY)
Admission: EM | Admit: 2021-09-14 | Discharge: 2021-09-14 | Disposition: A | Discharge status: Home / Self Care | Payer: Medicaid Other | Attending: Emergency Medicine | Admitting: Emergency Medicine

## 2021-09-14 ENCOUNTER — Other Ambulatory Visit: Payer: Self-pay

## 2021-09-14 ENCOUNTER — Other Ambulatory Visit: Payer: Self-pay | Admitting: Family Medicine

## 2021-09-14 DIAGNOSIS — I781 Nevus, non-neoplastic: Secondary | ICD-10-CM

## 2021-09-14 DIAGNOSIS — R238 Other skin changes: Secondary | ICD-10-CM

## 2021-09-14 MED ORDER — DOXYCYCLINE HYCLATE 100 MG PO CAPS: 100.0000 mg | ORAL_CAPSULE | Freq: Two times a day (BID) | ORAL | 0 refills | Status: AC

## 2021-09-14 NOTE — ED Provider Notes (Signed)
?[place] ? ? ?MRN: [id number] DOB: Nov 05, 1990 ? ?Subjective:  ? ?Chief Complaint;  ?Chief Complaint  ?Patient presents with  ? skin infection  ? ? ?Michelle [name] is a 31 y.o. female presenting with concern for left nare piercing infection.  Patient had the left nare pierced approximately 1 week ago, she has noticed increased redness to the site but has been using bacitracin.  The redness has not subsided so she came in for evaluation today.  She denies fever or discharge from the wound. ? ?No current facility-administered medications for this encounter. ? ?Current Outpatient Medications:  ?  albuterol (VENTOLIN HFA) 108 (90 Base) MCG/ACT inhaler, Inhale 2 puffs into the lungs every 4 (four) hours as needed for wheezing or shortness of breath., Disp: 18 g, Rfl: 1 ?  cetirizine (ZYRTEC) 10 MG tablet, TAKE 2 TABLETS(20 MG) BY MOUTH TWICE DAILY, Disp: 120 tablet, Rfl: 5 ?  EPINEPHrine (EPIPEN 2-PAK) 0.3 mg/0.3 mL IJ SOAJ injection, Inject 0.3 mg into the muscle once as needed for anaphylaxis., Disp: 2 each, Rfl: 1 ?  famotidine (PEPCID) 20 MG tablet, Take 1 tablet (20 mg total) by mouth 2 (two) times daily., Disp: 60 tablet, Rfl: 5 ?  Multiple Vitamin (MULTIVITAMIN ADULT PO), Take by mouth. (Patient not taking: Reported on 07/21/2021), Disp: , Rfl:  ?  polyethylene glycol powder (GLYCOLAX/MIRALAX) 17 GM/SCOOP powder, Take 17 g by mouth daily., Disp: 500 g, Rfl: 0  ? ?Allergies  ?Allergen Reactions  ? Adhesive [Tape] Hives and Other (See Comments)  ?  EKG or heart monitor pads- caused raised, red areas (WELTS) on the skin; "sensitive" pads fell off  ? Bee Venom Hives and Other (See Comments)  ?  Welts, also  ? ? ?Past Medical History:  ?Diagnosis Date  ? Anxiety   ? Asthma   ? Bipolar disorder ([place])   ? no current med.  ? Depression   ? no current med.  ? Dermatitis 01/16/2019  ? Family history of adverse reaction to anesthesia   ? states mother and sister are hard to wake up post-op  ? Gestational  diabetes   ? History of seizure 06/20/2012  ? x 1 - during delivery of child(eclampsia)  ? Hypermobile Ehlers-Danlos syndrome   ? Hypertension   ? Idiopathic urticaria   ? 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 Migraine   ? Nephrolithiasis   ? PTSD (post-traumatic stress disorder)   ? PTSD (post-traumatic stress disorder)   ? Pyelonephritis 01/16/2019  ? Seizures ([place])   ? Tachycardia   ? TMJ (dislocation of temporomandibular joint)   ? Vertigo   ?  ? ?Review of Systems  ?All other systems reviewed and are negative. ? ? ?Objective:  ? ?Vitals: ?BP 116/72   Pulse (!) 106   Temp 99 ?F (37.2 ?C)   Resp 18   LMP 08/14/2016 (Exact Date)   SpO2 100%  ? ?Physical Exam ?Vitals and nursing note reviewed. Exam conducted with a chaperone present.  ?Constitutional:   ?   Appearance: Normal appearance.  ?HENT:  ?   Head: Atraumatic.  ?   Nose:  ?   Comments: Patient has multiple nasal piercings.  Left nare piercing shows no evidence of erythema, induration, fluctuance or discharge.  There are present telemetry and genetic vessels that are erythematous in that area.  No gross swelling, no evidence of secondary infection ?Pulmonary:  ?  Effort: Pulmonary effort is normal.  ?Skin: ?   General: Skin is warm and dry.  ?Neurological:  ?   Mental Status: She is alert.  ? ? ?No results found for this or any previous visit (from the past 24 hour(s)). ? ?No results found.  ?  ? ?Assessment and Plan :  ? ?1. Telangiectasia   ?2. Skin irritation   ? ? ?No orders of the defined types were placed in this encounter. ? ? ?MDM:  ?Michelle [name] is a 31 y.o. female presenting for evaluation of possible infection in the left nare piercing.  Exam shows no evidence of acute infection, positive mild irritation of the skin locally without secondary signs of infection or discharge.  There are notable telangiectatic vessels present which enhance the appearance of erythema.  I discussed with patient signs  of infection addition of a wound she will take antibiotics if any of those signs are present, antibiotics are not needed at this time.  I discussed treatment, follow up and return instructions. Questions were answered. Patient stated understanding of instructions and is stable for discharge. ? ?Leida Lauth FNP-C MCN  ?  ?Hezzie Bump, NP ?09/14/21 1354 ? ?

## 2021-09-14 NOTE — Discharge Instructions (Addendum)
There are no signs of infection in the tissue or in the region of your recent piercing.  The skin appears slightly irritated and there are some small vessels that are standing out that is the likely redness you are seen.  You can start antibiotics if you are seeing spreading redness or pus from the wound.  Currently it does not appear that antibiotics would be helpful ?

## 2021-09-14 NOTE — ED Triage Notes (Signed)
Pt reports skin infection at piercing site LT sie of nose. ?

## 2021-09-22 ENCOUNTER — Ambulatory Visit (INDEPENDENT_AMBULATORY_CARE_PROVIDER_SITE_OTHER): Payer: Medicaid Other | Admitting: Family Medicine

## 2021-09-22 ENCOUNTER — Other Ambulatory Visit: Payer: Self-pay

## 2021-09-22 DIAGNOSIS — L219 Seborrheic dermatitis, unspecified: Secondary | ICD-10-CM

## 2021-09-22 DIAGNOSIS — L649 Androgenic alopecia, unspecified: Secondary | ICD-10-CM

## 2021-09-22 HISTORY — DX: Seborrheic dermatitis, unspecified: L21.9

## 2021-09-22 HISTORY — DX: Androgenic alopecia, unspecified: L64.9

## 2021-09-22 NOTE — Patient Instructions (Addendum)
It was wonderful to see you today. ? ?Today we talked about: ? ?-The hair loss is most likely age-related. At this time, we don't think that it is due to any autoimmune conditions. ?-We talked about a medication called Rogaine to help if this get worse. Keep in mind, this would need to be continued in order to keep the hair that grows as a result of the medication.  ?-You can buy a milder strength of this medication over the counter.  ?-For the itchiness on your scalp, you can try Selsun blue over the counter. Don't use more than 2x/week as it can dry your hair out. ? ?Thank you for choosing [facility].  ? ?Please call [phone] with any questions about today's appointment. ? ?Sharion Settler, DO ?PGY-2 Family Medicine   ?

## 2021-09-22 NOTE — Progress Notes (Signed)
? ? ?  SUBJECTIVE:  ? ?CHIEF COMPLAINT / HPI:  ? ?Hair Loss ?Michelle [name] is a 31 y.o. female who presents to the [facility] clinic for concerns of hair loss since 2018.  Reports that reports it has been steady since then and at its worst about 2 years ago.  She was previously seen by [facility] dermatologist who diagnosed her with psoriasis about a year ago.  States that at that time she was having "issue" inside her bellybutton and on her elbows.  She was prescribed a cream which has helped.  Regarding her hearing, she typically wears her hair up in a loose bone otherwise it is down.  She gets her hair dyed about every 6 months.  She does not typically pull out her hair.  She has tried biotin shampoo without much relief.  Her mom has had similar hair loss but was told it was her hair breaking and occurred later in life.  She has not had any recent illnesses, reports stress levels normal.  No new medications. ? ?Recent TSH reviewed from 2/23 which were normal. ? ?PERTINENT  PMH / PSH: PTSD, bipolar, fatigue, Tobacco use disorder, anxiety ? ?OBJECTIVE:  ? ?BP 116/76   Pulse 99   Wt 153 lb 3.2 oz (69.5 kg)   LMP 08/14/2016 (Exact Date)   SpO2 99%   BMI 28.02 kg/m?   ? ?General: NAD, pleasant, able to participate in exam ?HEENT: Normocephalic, thinning of hair at temples and vertex without any localized areas of alopecia ?Respiratory: Normal respiratory effort ?Skin: warm and dry, no rashes noted on upper extremities ?Psych: Normal affect and mood ? ? ? ? ? ? ? ? ?ASSESSMENT/PLAN:  ? ?Androgenic alopecia ?Likely normal and age-related given pattern. No focal areas of alopecia and scalp is normal. Normal TSH last month. Discussed observation vs. Trial of Rogaine. Patient is undecided at this time. Pictures taken today and can be monitored over time.  ? ?Seborrheic dermatitis ?On exam. Itchy scalp per patient. Recommend Selsun blue to use OTC. Limit use to 2x/week to avoid drying hair out. ?  ? ? ?Sharion Settler, DO ?[place]  ? ?

## 2021-09-22 NOTE — Assessment & Plan Note (Signed)
On exam. Itchy scalp per patient. Recommend Selsun blue to use OTC. Limit use to 2x/week to avoid drying hair out. ?

## 2021-09-22 NOTE — Assessment & Plan Note (Signed)
Likely normal and age-related given pattern. No focal areas of alopecia and scalp is normal. Normal TSH last month. Discussed observation vs. Trial of Rogaine. Patient is undecided at this time. Pictures taken today and can be monitored over time.  ?

## 2021-10-10 ENCOUNTER — Ambulatory Visit: Payer: Medicaid Other | Admitting: Allergy

## 2021-10-10 NOTE — Progress Notes (Deleted)
? ?Follow Up Note ? ?RE: LASHUNTA FRIEDEN MRN: [id number] DOB: 1990-08-18 ?Date of Office Visit: 10/10/2021 ? ?Referring provider: Donney Dice, DO ?Primary care provider: Orvis Brill, DO ? ?Chief Complaint: No chief complaint on file. ? ?History of Present Illness: ?I had the pleasure of seeing Yomira Flitton for a follow up visit at the [facility] on 10/10/2021. She is a 31 y.o. female, who is being followed for chronic urticaria, asthma, bee sting reaction. Her previous allergy office visit was on 07/21/2021 with [name]. Today is a regular follow up visit. ? ?Chronic urticaria ?Continue zyrtec (cetirizine) 14m 2 tablets twice a day. ?If hives are not controlled or causes drowsiness let [place]. ?Continue pepcid (famotidine) 258mtwice a day.  ?Avoid the following potential triggers: alcohol, tight clothing, NSAIDs, hot showers and getting overheated. ?Continue to consider Xolair injections. Information given along with side effects ?-Dr. KiMaudie Mercuryiscussed with patient at her last office visit that her tryptase level was borderline positive and with normal c kit there is low likelihood of having a mast cell disease. Even if she had a mast cell issue the initial treatment is antihistamines which she is on already.  ? At your request we will refer you to:  ?[facility] ?[address] [id number]  ?Contact: [name] ?Professor of Pediatrics & Internal Medicine ?Division of Pediatric Allergy & Immunology ?Phone: [phone]: [phone]  ?  ?Mild intermittent asthma ?May use albuterol rescue inhaler 2 puffs every 4 to 6 hours as needed for shortness of breath, chest tightness, coughing, and wheezing. Monitor frequency of use. ?Asthma control goals:  ?Full participation in all desired activities (may need albuterol before activity) ?Albuterol use two times or less a week on  average (not counting use with activity) ?Cough interfering with sleep two times or less a month ?Oral steroids no more than once a year ?No hospitalization ?  ?Bee sting  ?Avoid stinging insects. ?For mild symptoms you can take over the counter antihistamines such as Benadryl and monitor symptoms closely. If symptoms worsen or if you have severe symptoms including breathing issues, throat closure, significant swelling, whole body hives, severe diarrhea and vomiting, lightheadedness then inject epinephrine and seek immediate medical care afterwards. ?  ?Rash on right hand ?Continue with moisturization ?Try over the counter hydrocortisone cream and if it does not get better contact your dermatologist. ?If your symptoms re-occur, begin a journal of events that occurred for up to 6 hours before your symptoms began including foods and beverages consumed, soaps or perfumes you had contact with, and medications.  ? ?Chronic urticaria ?Past history - Reaction to premade oatmeal cookies 1 year ago in the form of rash/hives. Patient used to tolerate these cookies with no issues. Then in March/April noticed difficulty breathing after eating eggs and on Easter had a hive outbreak after eating an egg. 2020 testing negative to foods. 2020 immunocap negative to foods as well. Slightly elevated tryptase, normal c kit.  ?Interim history - Was doing well until 1 week ago. No hives for 6 months and stopped antihistamines for 4 months. Possibly increased stress but no other changes. ?Continue zyrtec (cetirizine) 1015m tablets twice a day. ?If hives are not controlled or causes drowsiness let us [place]. ?Continue Pepcid (famotidine) 63m84mice a day.  ?Avoid the following potential triggers: alcohol, tight clothing, NSAIDs, hot showers and getting overheated. ?Start  prednisone taper.  ?If hives not controlled then will start on Xolair injections - handout given.  ?Discussed with patient that her tryptase level was borderline positive  and with normal c kit there is low likelihood of having a mast cell disease. Even if she had a mast cell issue the initial treatment is antihistamines which she is on already.  ?If symptoms not improving, then consider referral to mast cell specialist - closest one is in [place].  ?  ?Mild intermittent asthma without complication ?Past history - Diagnosed with asthma over 10 years ago and currently has albuterol which helps. ?Interim history - no inhaler use.  ?May use albuterol rescue inhaler 2 puffs or nebulizer every 4 to 6 hours as needed for shortness of breath, chest tightness, coughing, and wheezing. May use albuterol rescue inhaler 2 puffs 5 to 15 minutes prior to strenuous physical activities. Monitor frequency of use.  ?  ?Bee sting allergy ?Past history - Stung by an insect 6 months ago and had hives for 1 week. Needed steroid injection. Bloodwork slightly positive to honeybee. ?Interim history - no stings since the last visit.  ?Avoid stinging insects. ?For mild symptoms you can take over the counter antihistamines such as Benadryl and monitor symptoms closely. If symptoms worsen or if you have severe symptoms including breathing issues, throat closure, significant swelling, whole body hives, severe diarrhea and vomiting, lightheadedness then inject epinephrine and seek immediate medical care afterwards. ? ?Assessment and Plan: ?Laia is a 31 y.o. female with: ?No problem-specific Assessment & Plan notes found for this encounter. ? ?No follow-ups on file. ? ?No orders of the defined types were placed in this encounter. ? ?Lab Orders  ?No laboratory test(s) ordered today  ? ? ?Diagnostics: ?Spirometry:  ?Tracings reviewed. Her effort: {Blank single:19197::"Good reproducible efforts.","It was hard to get consistent efforts and there is a question as to whether this reflects a maximal maneuver.","Poor effort, data can not be interpreted."} ?FVC: ***L ?FEV1: ***L, ***% predicted ?FEV1/FVC ratio:  ***% ?Interpretation: {Blank single:19197::"Spirometry consistent with mild obstructive disease","Spirometry consistent with moderate obstructive disease","Spirometry consistent with severe obstructive disease","Spirometry consistent with possible restrictive disease","Spirometry consistent with mixed obstructive and restrictive disease","Spirometry uninterpretable due to technique","Spirometry consistent with normal pattern","No overt abnormalities noted given today's efforts"}.  ?Please see scanned spirometry results for details. ? ?Skin Testing: {Blank single:19197::"Select foods","Environmental allergy panel","Environmental allergy panel and select foods","Food allergy panel","None","Deferred due to recent antihistamines use"}. ?*** ?Results discussed with patient/family. ? ? ?Medication List:  ?Current Outpatient Medications  ?Medication Sig Dispense Refill  ? albuterol (VENTOLIN HFA) 108 (90 Base) MCG/ACT inhaler Inhale 2 puffs into the lungs every 4 (four) hours as needed for wheezing or shortness of breath. 18 g 1  ? cetirizine (ZYRTEC) 10 MG tablet TAKE 2 TABLETS(20 MG) BY MOUTH TWICE DAILY 120 tablet 5  ? EPINEPHrine (EPIPEN 2-PAK) 0.3 mg/0.3 mL IJ SOAJ injection Inject 0.3 mg into the muscle once as needed for anaphylaxis. 2 each 1  ? famotidine (PEPCID) 20 MG tablet Take 1 tablet (20 mg total) by mouth 2 (two) times daily. 60 tablet 5  ? Multiple Vitamin (MULTIVITAMIN ADULT PO) Take by mouth. (Patient not taking: Reported on 07/21/2021)    ? polyethylene glycol powder (GLYCOLAX/MIRALAX) 17 GM/SCOOP powder Take 17 g by mouth daily. 500 g 0  ? ?No current facility-administered medications for this visit.  ? ?Allergies: ?Allergies  ?Allergen Reactions  ? Adhesive [Tape] Hives and Other (See Comments)  ?  EKG or heart monitor pads- caused  raised, red areas (WELTS) on the skin; "sensitive" pads fell off  ? Bee Venom Hives and Other (See Comments)  ?  Welts, also  ? ?I reviewed her past medical history, social  history, family history, and environmental history and no significant changes have been reported from her previous visit. ? ?Review of Systems ? ?Objective: ?LMP 08/14/2016 (Exact Date)  ?There is no height or weight on

## 2021-10-15 ENCOUNTER — Emergency Department (HOSPITAL_BASED_OUTPATIENT_CLINIC_OR_DEPARTMENT_OTHER)
Admission: EM | Admit: 2021-10-15 | Discharge: 2021-10-16 | Disposition: A | Discharge status: Home / Self Care | Payer: Medicaid Other | Attending: Emergency Medicine | Admitting: Emergency Medicine

## 2021-10-15 ENCOUNTER — Encounter (HOSPITAL_BASED_OUTPATIENT_CLINIC_OR_DEPARTMENT_OTHER): Payer: Self-pay | Admitting: *Deleted

## 2021-10-15 ENCOUNTER — Other Ambulatory Visit: Payer: Self-pay

## 2021-10-15 DIAGNOSIS — R1032 Left lower quadrant pain: Secondary | ICD-10-CM | POA: Diagnosis present

## 2021-10-15 DIAGNOSIS — K659 Peritonitis, unspecified: Secondary | ICD-10-CM | POA: Diagnosis not present

## 2021-10-15 DIAGNOSIS — K388 Other specified diseases of appendix: Secondary | ICD-10-CM | POA: Diagnosis not present

## 2021-10-15 DIAGNOSIS — R000 Tachycardia, unspecified: Secondary | ICD-10-CM | POA: Diagnosis not present

## 2021-10-15 DIAGNOSIS — K6389 Other specified diseases of intestine: Secondary | ICD-10-CM

## 2021-10-15 LAB — CBC
HCT: 39.2 % (ref 36.0–46.0)
Hemoglobin: 13.3 g/dL (ref 12.0–15.0)
MCH: 30.5 pg (ref 26.0–34.0)
MCHC: 33.9 g/dL (ref 30.0–36.0)
MCV: 89.9 fL (ref 80.0–100.0)
Platelets: 276 10*3/uL (ref 150–400)
RBC: 4.36 MIL/uL (ref 3.87–5.11)
RDW: 11.6 % (ref 11.5–15.5)
WBC: 10.3 10*3/uL (ref 4.0–10.5)
nRBC: 0 % (ref 0.0–0.2)

## 2021-10-15 LAB — URINALYSIS, ROUTINE W REFLEX MICROSCOPIC
Bilirubin Urine: NEGATIVE
Glucose, UA: NEGATIVE mg/dL
Hgb urine dipstick: NEGATIVE
Ketones, ur: NEGATIVE mg/dL
Leukocytes,Ua: NEGATIVE
Nitrite: NEGATIVE
Protein, ur: NEGATIVE mg/dL
Specific Gravity, Urine: <1.005 — ABNORMAL LOW (ref 1.005–1.030)
pH: 6.5 (ref 5.0–8.0)

## 2021-10-15 LAB — COMPREHENSIVE METABOLIC PANEL
ALT: 8 U/L (ref 0–44)
AST: 14 U/L — ABNORMAL LOW (ref 15–41)
Albumin: 4.5 g/dL (ref 3.5–5.0)
Alkaline Phosphatase: 57 U/L (ref 38–126)
Anion gap: 11 (ref 5–15)
BUN: 12 mg/dL (ref 6–20)
CO2: 21 mmol/L — ABNORMAL LOW (ref 22–32)
Calcium: 9.1 mg/dL (ref 8.9–10.3)
Chloride: 105 mmol/L (ref 98–111)
Creatinine, Ser: 0.64 mg/dL (ref 0.44–1.00)
GFR, Estimated: >60 mL/min (ref >60)
Glucose, Bld: 103 mg/dL — ABNORMAL HIGH (ref 70–99)
Potassium: 3.9 mmol/L (ref 3.5–5.1)
Sodium: 137 mmol/L (ref 135–145)
Total Bilirubin: 0.2 mg/dL — ABNORMAL LOW (ref 0.3–1.2)
Total Protein: 7.4 g/dL (ref 6.5–8.1)

## 2021-10-15 LAB — PREGNANCY, URINE: Preg Test, Ur: NEGATIVE

## 2021-10-15 LAB — LIPASE, BLOOD: Lipase: 20 U/L (ref 11–51)

## 2021-10-15 NOTE — ED Notes (Signed)
Patient has cup for urine ?

## 2021-10-15 NOTE — ED Triage Notes (Signed)
Pt is here for LLQ abdominal pain which began this am and is associated with difficulty urinating.  Pt has hx of kidney stones.  ?

## 2021-10-16 ENCOUNTER — Emergency Department (HOSPITAL_BASED_OUTPATIENT_CLINIC_OR_DEPARTMENT_OTHER): Payer: Medicaid Other

## 2021-10-16 DIAGNOSIS — R1032 Left lower quadrant pain: Secondary | ICD-10-CM | POA: Diagnosis not present

## 2021-10-16 MED ORDER — HYDROMORPHONE HCL 1 MG/ML IJ SOLN: 1.0000 mg | Freq: Once | INTRAMUSCULAR | Status: DC

## 2021-10-16 MED ORDER — HYDROMORPHONE HCL 1 MG/ML IJ SOLN
1.0000 mg | Freq: Once | INTRAMUSCULAR | Status: AC
  Administered 2021-10-16: 1 mg via INTRAVENOUS
  Filled 2021-10-16: qty 1

## 2021-10-16 MED ORDER — METOCLOPRAMIDE HCL 5 MG/ML IJ SOLN
10.0000 mg | Freq: Once | INTRAMUSCULAR | Status: AC
Start: 2021-10-16 — End: 2021-10-16
  Administered 2021-10-16: 10 mg via INTRAVENOUS
  Filled 2021-10-16: qty 2

## 2021-10-16 MED ORDER — ONDANSETRON HCL 4 MG/2ML IJ SOLN
4.0000 mg | Freq: Once | INTRAMUSCULAR | Status: AC
  Administered 2021-10-16: 4 mg via INTRAVENOUS
  Filled 2021-10-16: qty 2

## 2021-10-16 MED ORDER — SODIUM CHLORIDE 0.9 % IV BOLUS
1000.0000 mL | Freq: Once | INTRAVENOUS | Status: AC
Start: 2021-10-16 — End: 2021-10-16
  Administered 2021-10-16: 1000 mL via INTRAVENOUS

## 2021-10-16 MED ORDER — IBUPROFEN 800 MG PO TABS: 800.0000 mg | ORAL_TABLET | Freq: Four times a day (QID) | ORAL | 0 refills | Status: DC | PRN

## 2021-10-16 MED ORDER — DIPHENHYDRAMINE HCL 50 MG/ML IJ SOLN
25.0000 mg | Freq: Once | INTRAMUSCULAR | Status: AC
  Administered 2021-10-16: 25 mg via INTRAVENOUS
  Filled 2021-10-16: qty 1

## 2021-10-16 MED ORDER — IOHEXOL 300 MG/ML  SOLN
100.0000 mL | Freq: Once | INTRAMUSCULAR | Status: AC | PRN
  Administered 2021-10-16: 100 mL via INTRAVENOUS

## 2021-10-16 NOTE — ED Notes (Signed)
Patient verbalizes understanding of discharge instructions. Opportunity for questioning and answers were provided. Pt discharged from ED to home via POV with significant other.  ?

## 2021-10-16 NOTE — ED Provider Notes (Signed)
?[facility] ?Provider Note ? ? ?CSN: [id number] ?Arrival date & time: 10/15/21  2023 ? ?  ? ?History ? ?Chief Complaint  ?Patient presents with  ? Abdominal Pain  ? ? ?Michelle [name] is a 31 y.o. female. ? ?Patient presents to the emergency department for evaluation of left lower abdominal pain.  Symptoms began earlier this morning.  She has noticed a sense of needing to urinate with inability to pass urine today.  She reports the last time she had this sensation it was when she had a kidney stone.  She has had sepsis associated with kidney stones in the past. ? ? ?  ? ?Home Medications ?Prior to Admission medications   ?Medication Sig Start Date End Date Taking? Authorizing Provider  ?albuterol (VENTOLIN HFA) 108 (90 Base) MCG/ACT inhaler Inhale 2 puffs into the lungs every 4 (four) hours as needed for wheezing or shortness of breath. 07/21/21   Althea Charon, [place]  ?cetirizine (ZYRTEC) 10 MG tablet TAKE 2 TABLETS(20 MG) BY MOUTH TWICE DAILY 07/21/21   Althea Charon, FNP  ?EPINEPHrine (EPIPEN 2-PAK) 0.3 mg/0.3 mL IJ SOAJ injection Inject 0.3 mg into the muscle once as needed for anaphylaxis. 01/05/21   Garnet Sierras, DO  ?famotidine (PEPCID) 20 MG tablet Take 1 tablet (20 mg total) by mouth 2 (two) times daily. 07/21/21   Althea Charon, [place]  ?Multiple Vitamin (MULTIVITAMIN ADULT PO) Take by mouth. ?Patient not taking: Reported on 07/21/2021    [provider]  ?polyethylene glycol powder (GLYCOLAX/MIRALAX) 17 GM/SCOOP powder Take 17 g by mouth daily. 08/30/21   Ganta, Anupa, DO  ?fluticasone (FLONASE) 50 MCG/ACT nasal spray Place 1 spray into both nostrils daily. ?Patient not taking: No sig reported 01/06/19 08/06/20  Garnet Sierras, DO  ?nortriptyline (PAMELOR) 25 MG capsule Take 1 capsule (25 mg total) by mouth at bedtime. 04/09/20 08/06/20  Cameron Sprang, MD  ?   ? ?Allergies    ?Adhesive [tape] and Bee venom   ? ?Review of Systems   ?Review of Systems  ?Gastrointestinal:  Positive for  abdominal pain.  ?Genitourinary:  Positive for decreased urine volume and difficulty urinating.  ? ?Physical Exam ?Updated Vital Signs ?BP 92/61   Pulse 77   Temp 98.2 ?F (36.8 ?C)   Resp 18   Wt 68 kg   LMP 08/14/2016 (Exact Date)   SpO2 98%   BMI 27.44 kg/m?  ?Physical Exam ?Vitals and nursing note reviewed.  ?Constitutional:   ?   General: She is not in acute distress. ?   Appearance: She is well-developed.  ?HENT:  ?   Head: Normocephalic and atraumatic.  ?   Mouth/Throat:  ?   Mouth: Mucous membranes are moist.  ?Eyes:  ?   General: Vision grossly intact. Gaze aligned appropriately.  ?   Extraocular Movements: Extraocular movements intact.  ?   Conjunctiva/sclera: Conjunctivae normal.  ?Cardiovascular:  ?   Rate and Rhythm: Normal rate and regular rhythm.  ?   Pulses: Normal pulses.  ?   Heart sounds: Normal heart sounds, S1 normal and S2 normal. No murmur heard. ?  No friction rub. No gallop.  ?Pulmonary:  ?   Effort: Pulmonary effort is normal. No respiratory distress.  ?   Breath sounds: Normal breath sounds.  ?Abdominal:  ?   General: Bowel sounds are normal.  ?   Palpations: Abdomen is soft.  ?   Tenderness: There is abdominal tenderness in the left lower quadrant. There  is no guarding or rebound.  ?   Hernia: No hernia is present.  ?Musculoskeletal:     ?   General: No swelling.  ?   Cervical back: Full passive range of motion without pain, normal range of motion and neck supple. No spinous process tenderness or muscular tenderness. Normal range of motion.  ?   Right lower leg: No edema.  ?   Left lower leg: No edema.  ?Skin: ?   General: Skin is warm and dry.  ?   Capillary Refill: Capillary refill takes less than 2 seconds.  ?   Findings: No ecchymosis, erythema, rash or wound.  ?Neurological:  ?   General: No focal deficit present.  ?   Mental Status: She is alert and oriented to person, place, and time.  ?   GCS: GCS eye subscore is 4. GCS verbal subscore is 5. GCS motor subscore is 6.  ?    Cranial Nerves: Cranial nerves 2-12 are intact.  ?   Sensory: Sensation is intact.  ?   Motor: Motor function is intact.  ?   Coordination: Coordination is intact.  ?Psychiatric:     ?   Attention and Perception: Attention normal.     ?   Mood and Affect: Mood normal.     ?   Speech: Speech normal.     ?   Behavior: Behavior normal.  ? ? ?ED Results / Procedures / Treatments   ?Labs ?(all labs ordered are listed, but only abnormal results are displayed) ?Labs Reviewed  ?COMPREHENSIVE METABOLIC PANEL - Abnormal; Notable for the following components:  ?    Result Value  ? CO2 21 (*)   ? Glucose, Bld 103 (*)   ? AST 14 (*)   ? Total Bilirubin 0.2 (*)   ? All other components within normal limits  ?URINALYSIS, ROUTINE W REFLEX MICROSCOPIC - Abnormal; Notable for the following components:  ? Color, Urine COLORLESS (*)   ? Specific Gravity, Urine <1.005 (*)   ? All other components within normal limits  ?LIPASE, BLOOD  ?CBC  ?PREGNANCY, URINE  ? ? ?EKG ?None ? ?Radiology ?CT ABDOMEN PELVIS W CONTRAST ? ?Result Date: 10/16/2021 ?CLINICAL DATA:  Left lower quadrant abdominal pain. Difficulty urinating. History of kidney stones. EXAM: CT ABDOMEN AND PELVIS WITH CONTRAST TECHNIQUE: Multidetector CT imaging of the abdomen and pelvis was performed using the standard protocol following bolus administration of intravenous contrast. RADIATION DOSE REDUCTION: This exam was performed according to the departmental dose-optimization program which includes automated exposure control, adjustment of the mA and/or kV according to patient size and/or use of iterative reconstruction technique. CONTRAST:  155m OMNIPAQUE IOHEXOL 300 MG/ML  SOLN COMPARISON:  03/14/2021 FINDINGS: Lower chest: Mild dependent atelectasis in the lung bases. Hepatobiliary: No focal liver abnormality is seen. No gallstones, gallbladder wall thickening, or biliary dilatation. Pancreas: Unremarkable. No pancreatic ductal dilatation or surrounding inflammatory changes.  Spleen: Normal in size without focal abnormality. Adrenals/Urinary Tract: Adrenal glands are unremarkable. Kidneys are normal, without renal calculi, focal lesion, or hydronephrosis. Bladder is unremarkable. Stomach/Bowel: Stomach, small bowel, and colon are not abnormally distended. No wall thickening is appreciated. There is infiltration in the pericolonic fat of the left lower quadrant at the junction of the descending and sigmoid colon. Central fatty sparing is present. The appearance is most consistent with epiploic appendagitis. The appendix appears to be surgically absent. Vascular/Lymphatic: No significant vascular findings are present. No enlarged abdominal or pelvic lymph nodes. Reproductive: Uterus and  bilateral adnexa are unremarkable. Other: Small amount of free fluid in the pelvis is likely reactive or physiologic. No free air. Abdominal wall musculature appears intact. Musculoskeletal: No acute or significant osseous findings. IMPRESSION: 1. Inflammation in the pericolonic fat at the junction of the descending and sigmoid colon in the left lower quadrant consistent with epiploic appendagitis. 2. No evidence of bowel obstruction. 3. No obstructing renal stones are seen. 4. Small amount of free fluid in the pelvis could be reactive or physiologic. Electronically Signed   By: Lucienne Capers M.D.   On: 10/16/2021 03:13   ? ?Procedures ?Procedures  ? ? ?Medications Ordered in ED ?Medications  ?HYDROmorphone (DILAUDID) injection 1 mg (1 mg Intravenous Given 10/16/21 0050)  ?ondansetron [facility]) injection 4 mg (4 mg Intravenous Given 10/16/21 0051)  ?metoCLOPramide (REGLAN) injection 10 mg (10 mg Intravenous Given 10/16/21 0153)  ?diphenhydrAMINE (BENADRYL) injection 25 mg (25 mg Intravenous Given 10/16/21 0153)  ?iohexol (OMNIPAQUE) 300 MG/ML solution 100 mL (100 mLs Intravenous Contrast Given 10/16/21 0252)  ?sodium chloride 0.9 % bolus 1,000 mL (1,000 mLs Intravenous New Bag/Given 10/16/21 0325)  ? ? ?ED Course/  Medical Decision Making/ A&P ?  ?                        ?Medical Decision Making ?Amount and/or Complexity of Data Reviewed ?Labs: ordered. ?Radiology: ordered. ? ?Risk ?Prescription drug management. ? ? ?Presents

## 2021-10-16 NOTE — ED Notes (Signed)
Updated BP reported to MD after bolus. Pt assisted to ambulate at MD direction, does well at this, tolerates ambulation without weakness or dizziness ?

## 2021-10-16 NOTE — ED Notes (Signed)
EDP at bedside  

## 2021-10-17 ENCOUNTER — Ambulatory Visit (INDEPENDENT_AMBULATORY_CARE_PROVIDER_SITE_OTHER): Payer: Medicaid Other | Admitting: Family Medicine

## 2021-10-17 ENCOUNTER — Encounter: Payer: Self-pay | Admitting: Family Medicine

## 2021-10-17 VITALS — BP 110/76 | Ht 62.0 in | Wt 150.0 lb

## 2021-10-17 DIAGNOSIS — M533 Sacrococcygeal disorders, not elsewhere classified: Secondary | ICD-10-CM

## 2021-10-17 HISTORY — DX: Sacrococcygeal disorders, not elsewhere classified: M53.3

## 2021-10-17 NOTE — Progress Notes (Signed)
?  Michelle [name] - 31 y.o. female MRN [id number]  Date of birth: 1991/07/01 ? ?SUBJECTIVE:  Including CC & ROS.  ?No chief complaint on file. ? ? ?Michelle [name] is a 31 y.o. female that is presenting with acute gluteal pain.  She had surgery in February of last year to repair a labral tear in her right hip.  She felt a pop in her hip and had concern for possible retear.  Pain is localizing to the gluteus and only presenting with pushing wheelchairs.  No radicular pain. ? ? ?Review of Systems ?See HPI  ? ?HISTORY: Past Medical, Surgical, Social, and Family History Reviewed & Updated per EMR.   ?Pertinent Historical Findings include: ? ?Past Medical History:  ?Diagnosis Date  ? Anxiety   ? Asthma   ? Bipolar disorder ([place])   ? no current med.  ? Depression   ? no current med.  ? Dermatitis 01/16/2019  ? Family history of adverse reaction to anesthesia   ? states mother and sister are hard to wake up post-op  ? Gestational diabetes   ? History of seizure 06/20/2012  ? x 1 - during delivery of child(eclampsia)  ? Hypermobile Ehlers-Danlos syndrome   ? Hypertension   ? Idiopathic urticaria   ? 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 Migraine   ? Nephrolithiasis   ? PTSD (post-traumatic stress disorder)   ? PTSD (post-traumatic stress disorder)   ? Pyelonephritis 01/16/2019  ? Seizures ([place])   ? Tachycardia   ? TMJ (dislocation of temporomandibular joint)   ? Vertigo   ? ? ?Past Surgical History:  ?Procedure Laterality Date  ? ABDOMINAL HYSTERECTOMY    ? ADENOIDECTOMY, TONSILLECTOMY AND MYRINGOTOMY WITH TUBE PLACEMENT  1990's  ? APPENDECTOMY    ? CESAREAN SECTION  06/20/2012  ? Procedure: CESAREAN SECTION;  Surgeon: Emily Filbert, MD;  Location: [facility];  Service: Obstetrics;  Laterality: N/A;  ? HIP SURGERY    ? LAPAROSCOPIC APPENDECTOMY  07/25/2012  ? Procedure: APPENDECTOMY LAPAROSCOPIC;  Surgeon: Zenovia Jarred, MD;  Location: [facility];  Service: General;  Laterality: N/A;  ?  LAPAROSCOPIC TUBAL LIGATION Bilateral 08/04/2014  ? Procedure: BILATERAL LAPAROSCOPIC TUBAL LIGATION;  Surgeon: Woodroe Mode, MD;  Location: [facility];  Service: Gynecology;  Laterality: Bilateral;  ? LIPOMA EXCISION N/A 09/16/2015  ? Procedure: EXCISION LIPOMA LUMBAR REGION;  Surgeon: Donnie Mesa, MD;  Location: [facility];  Service: General;  Laterality: N/A;  ? TONSILLECTOMY    ? WISDOM TOOTH EXTRACTION    ? ? ? ?PHYSICAL EXAM:  ?VS: BP 110/76 (BP Location: Left Arm, Patient Position: Sitting)   Ht '5\' 2"'$  (1.575 m)   Wt 150 lb (68 kg)   LMP 08/14/2016 (Exact Date)   BMI 27.44 kg/m?  ?Physical Exam ?Gen: NAD, alert, cooperative with exam, well-appearing ?MSK:  ?Neurovascularly intact   ? ? ? ? ?ASSESSMENT & PLAN:  ? ?Sacroiliac joint dysfunction of right side ?Acutely occurring.  Symptoms seem more consistent with piriformis over the SI joint.  Seems less likely to be associated with the right hip joint. ?-Counseled on home exercise therapy and supportive care. ?-Could consider further imaging or physical therapy. ? ? ? ? ?

## 2021-10-17 NOTE — Assessment & Plan Note (Signed)
Acutely occurring.  Symptoms seem more consistent with piriformis over the SI joint.  Seems less likely to be associated with the right hip joint. ?-Counseled on home exercise therapy and supportive care. ?-Could consider further imaging or physical therapy. ?

## 2021-10-17 NOTE — Patient Instructions (Signed)
Good to see you ?Please try heat  ?Please try the exercises   ?Please send me a message in MyChart with any questions or updates.  ?Please see me back as needed.  ? ?--Dr. Raeford Razor ? ?

## 2021-10-18 ENCOUNTER — Encounter: Payer: Self-pay | Admitting: Family Medicine

## 2021-10-18 ENCOUNTER — Ambulatory Visit (INDEPENDENT_AMBULATORY_CARE_PROVIDER_SITE_OTHER): Payer: Medicaid Other | Admitting: Family Medicine

## 2021-10-18 VITALS — BP 106/70 | HR 108 | Ht 62.0 in | Wt 150.8 lb

## 2021-10-18 DIAGNOSIS — K6389 Other specified diseases of intestine: Secondary | ICD-10-CM | POA: Diagnosis present

## 2021-10-18 HISTORY — DX: Other specified diseases of intestine: K63.89

## 2021-10-18 MED ORDER — HYDROCODONE-ACETAMINOPHEN 5-325 MG PO TABS: 1.0000 | ORAL_TABLET | Freq: Four times a day (QID) | ORAL | 0 refills | Status: DC | PRN

## 2021-10-18 NOTE — Assessment & Plan Note (Signed)
Patient's pain is stable from PT evaluation.  She has not taken any NSAIDs due to concern for flare of urticaria or damage to kidneys.  Discussed that from a kidney standpoint she could tolerate NSAIDs but recommended she reach out to her allergy specialist to see if it would be okay for her to take smaller doses of ibuprofen.  Sent 4-day prescription for hydrocodone.  Provided strict return precautions as well as ED precautions. ?

## 2021-10-18 NOTE — Patient Instructions (Signed)
It was a pleasure seeing you today.  I am sorry you are having all of this abdominal pain.  I recommend you talk to your allergist about if it is okay for you to take a short course of NSAIDs such as ibuprofen.  You can take 400 mg rather than 800 mg.  I also sent a 4-day prescription for hydrocodone to your pharmacy.  If your symptoms worsen or do not improve in 7 days please be reevaluated either here in the emergency department.  I hope you get to feeling better! ? ? ?

## 2021-10-18 NOTE — Progress Notes (Signed)
? ? ?  SUBJECTIVE:  ? ?CHIEF COMPLAINT / HPI:  ? ?ED f/u for epiploic appendagitis ?Patient reports that she had considerable pain starting on 4/1 and had to go to the emergency department.  She thought that she had a kidney stone because she has history of kidney stones and has been septic from kidney stones in the past.  She had a CT abdomen pelvis completed which showed concern for epiploic appendagitis.  It was recommended that she take NSAIDs and return precautions were given.  She reports that she does not take NSAIDs because she was told by 2 specialist that she should not take these.  Her creatinine in the hospital was within normal limits but she reports that it will cause flares in her chronic urticaria.  She reports that the pain is still considerable in her left lower quadrant.  She has been taking Tylenol but has had no relief from this.  Has been able to drink fluids but has felt nauseous when eating so has not eaten much.  Reports continued bowel movements which appear regular.  She does report that when she has a bowel movement it causes more pain because of the tightening in her abdomen.  Denies any blood in her stool.  Denies any hard bowel movements. ? ?OBJECTIVE:  ? ?BP 106/70   Pulse (!) 108   Ht '5\' 2"'$  (1.575 m)   Wt 150 lb 12.8 oz (68.4 kg)   LMP 08/14/2016 (Exact Date)   SpO2 98%   BMI 27.58 kg/m?   ?General: Uncomfortable looking 30 year old female ?Cardiac: Regular rate and rhythm ?Respiratory: Breathing, speaking in full sentences ?Abdomen: Positive bowel sounds, considerable tenderness to palpation left lower quadrant with no rebounding or guarding, improved when she is laying flat ? ?ASSESSMENT/PLAN:  ? ?Epiploic appendagitis ?Patient's pain is stable from PT evaluation.  She has not taken any NSAIDs due to concern for flare of urticaria or damage to kidneys.  Discussed that from a kidney standpoint she could tolerate NSAIDs but recommended she reach out to her allergy specialist to  see if it would be okay for her to take smaller doses of ibuprofen.  Sent 4-day prescription for hydrocodone.  Provided strict return precautions as well as ED precautions. ?  ? ? ?Gifford Shave, MD ?[place]  ? ?

## 2021-10-19 DIAGNOSIS — H5213 Myopia, bilateral: Secondary | ICD-10-CM | POA: Diagnosis not present

## 2021-10-24 ENCOUNTER — Ambulatory Visit (INDEPENDENT_AMBULATORY_CARE_PROVIDER_SITE_OTHER): Payer: Medicaid Other | Admitting: Family Medicine

## 2021-10-24 ENCOUNTER — Encounter: Payer: Self-pay | Admitting: Family Medicine

## 2021-10-24 VITALS — BP 110/70 | Wt 153.0 lb

## 2021-10-24 DIAGNOSIS — K6389 Other specified diseases of intestine: Secondary | ICD-10-CM

## 2021-10-24 MED ORDER — ONDANSETRON HCL 4 MG PO TABS: 4.0000 mg | ORAL_TABLET | Freq: Three times a day (TID) | ORAL | 0 refills | Status: DC | PRN

## 2021-10-24 NOTE — Progress Notes (Addendum)
? ? ? ?  SUBJECTIVE:  ? ?CHIEF COMPLAINT / HPI:  ? ?Michelle [name] is a 31 y.o. female presents for follow up  ? ? ?Epiploic appendagitis ?Reports hx of LLQ pain over the last 10 days. Recent CT abdomen pelvis in the ED showed epiploic appendagitis. Pt was discharged with conservative management and strict return precautions. She saw Dr Minna Antis last week for follow up who recommended f/u from 1 week from onset of sx if no improvement. Pt reports moderate LLQ pain 5/10 severity currently, 9/10 at times when sleeping. Overall her pain has improved a little. The hydrocodone and NSAIDs are helping a bit. Unable to eat much due to loss of appetite and has nausea. Has been able to tolerate fluids. Denies abdominal distension, fevers, vomiting, diarrhea, rectal bleeding or melena, dysuria, frequency, urgency or hematuria.   ? ?[facility] Visit from 10/24/2021 in [facility]  ?PHQ-9 Total Score 0  ? ?  ?  ? ?PERTINENT  PMH / PSH: medullary sponge kidneys, renal colic  ? ?OBJECTIVE:  ? ?BP 110/70   Wt 153 lb (69.4 kg)   LMP 08/14/2016 (Exact Date)   BMI 27.98 kg/m?   ? ?General: Alert, no mild distress due to pain ?Cardio:  well perfused  ?Pulm: normal work of breathing ?Abdomen: Bowel sounds normal. Abdomen soft, TTP LLQ, no guarding ?Extremities: No peripheral edema.  ?Neuro: Cranial nerves grossly intact  ? ?ASSESSMENT/PLAN:  ? ?Epiploic appendagitis ?LLQ pain has improved a little, however I am still concerned as she is at least 10 days out from initial dx with on going symptoms. No acute indication to sent pt to ED I would like to refer to surgery given persistent symptoms. Recommended close follow up with [facility] over the next week if she is unable to get surgery f/u within that time. Pt still some hydrocodone tablets left which she has previously been prescribed. She declined the need for further refill, I am happy to refill this for a further 3 days if pt needs over the next week.  Explained that she can call us and I will prescribe it. Explained that this is only a short term medication and I would not refill the medication more than once. Pt expressed understanding. Also prescribed zofran for nausea. Strict ER precautions given to pt.  ?  ? ?Lattie Haw, MD PGY-3 ?[place]   ?

## 2021-10-24 NOTE — Patient Instructions (Signed)
Thank you for coming to see me today. It was a pleasure. Today we discussed your symptoms, I am sorry to hear you are in so much pain. I recommend continuing hydrocodone, ibuprofen, rest and fluids. I am referring you to surgery, ? ?Please go to the ER if you develop fevers, vomiting, rectal bleeding, black stools, severe abdominal pain etc.  ? ?Come back next week for follow up if you have not seen the surgeons by then  ? ?If you have any questions or concerns, please do not hesitate to call the office at [phone]. ? ?Best wishes,  ? ?Dr Posey Pronto   ?

## 2021-10-27 ENCOUNTER — Other Ambulatory Visit: Payer: Self-pay

## 2021-10-27 MED ORDER — HYDROCODONE-ACETAMINOPHEN 5-325 MG PO TABS: 1.0000 | ORAL_TABLET | Freq: Four times a day (QID) | ORAL | 0 refills | Status: AC | PRN

## 2021-10-27 NOTE — Assessment & Plan Note (Addendum)
LLQ pain has improved a little, however I am still concerned as she is at least 10 days out from initial dx with on going symptoms. No acute indication to sent pt to ED I would like to refer to surgery given persistent symptoms. Recommended close follow up with [facility] over the next week if she is unable to get surgery f/u within that time. Pt still some hydrocodone tablets left which she has previously been prescribed. She declined the need for further refill, I am happy to refill this for a further 3 days if pt needs over the next week. Explained that she can call us and I will prescribe it. Explained that this is only a short term medication and I would not refill the medication more than once. Pt expressed understanding. Also prescribed zofran for nausea. Strict ER precautions given to pt.  ?

## 2021-10-27 NOTE — Telephone Encounter (Signed)
Patient calls nurse line reporting she takes her last tablet of hydrocodone this evening.  ? ?Patient reports Dr. Posey Pronto told her to call her for another 3 day supply.  ? ?Will forward.  ?

## 2021-10-27 NOTE — Telephone Encounter (Signed)
Approved hydrocodone for 3 further days only as this was discussed in clinic yesterday. If pt is requiring further narcotic analgesia she must be seen in person in clinic.  Please could you inform the pt? Thank you. ?

## 2021-10-31 ENCOUNTER — Ambulatory Visit (INDEPENDENT_AMBULATORY_CARE_PROVIDER_SITE_OTHER): Payer: Medicaid Other | Admitting: Family Medicine

## 2021-10-31 VITALS — BP 102/70 | Ht 62.0 in | Wt 155.0 lb

## 2021-10-31 DIAGNOSIS — R1032 Left lower quadrant pain: Secondary | ICD-10-CM

## 2021-10-31 DIAGNOSIS — K6389 Other specified diseases of intestine: Secondary | ICD-10-CM

## 2021-10-31 MED ORDER — HYDROCODONE-ACETAMINOPHEN 10-325 MG PO TABS: 1.0000 | ORAL_TABLET | Freq: Three times a day (TID) | ORAL | 0 refills | Status: AC | PRN

## 2021-10-31 NOTE — Patient Instructions (Addendum)
It was a pleasure to see you today! ? ?Referral to surgery: [facility] [place] [facility] [place]. [address] [place], [place] [address], [phone] ?We will get some labs today.  If they are abnormal or we need to do something about them, I will call you.  If they are normal, I will send you a message on MyChart (if it is active) or a letter in the mail.  If you don't hear from [place] in 2 weeks, please call the office  [phone]. ?We are scheduling a CT of your abdomen again, we will update you on time. ?Continue to treat your symptoms as best you can. If  you cannot tolerate fluid by mouth, have terrible abdominal pain that does not improve with medication, have a fever (temp above 100.4*F), then please go to the ED ? ? ?Be Well, ? ?Dr. Chauncey Reading ? ?

## 2021-10-31 NOTE — Progress Notes (Signed)
? ? ?  SUBJECTIVE:  ? ?CHIEF COMPLAINT / HPI:  ? ?Epiploic appendagitis: With epiploic appendagitis after having left lower quadrant pain, nausea, vomiting on March 31 with CT a/p at [facility].  She then saw Dr. Caron Presume on April 4 at [facility], was treated conservatively with Zofran and hydrocodone, still had acute LLQ pain.  She then followed up again with Dr. Posey Pronto on April 10, continued conservative treatment with Zofran and hydrocodone with continued LLQ pain. Dr. Posey Pronto recommended that she be referred to surgery given her continued symptoms.  Referral to surgery placed.  Patient states she has not received a call yet.  Today patient continues to have intense left lower quadrant pain that is constantly present, worse when palpated.  She reports that she has had a lot of abdominal fullness and previously was only taking full to take about 1 bite of food before she felt full and nauseous.  She reports she has been able to eat a little bit more solid food since her last visit, however she still feels full quickly and nauseated.  She has been using Zofran and 1-2 hydrocodone per day to manage pain as well as ibuprofen.  Reports that she is able to pass flatus, and she has had diarrhea.  Her bowel movements are not every day, last bowel movement yesterday.  Sometimes she feels like she needs to have a bowel movement, but cannot pass one, then when she does stool, it is liquid. She denies fevers, chills. Able to tolerate PO liquids, though nausea afterward. ? ?PERTINENT  PMH / PSH: epiploic appendagitis ? ?OBJECTIVE:  ? ?BP 102/70   Ht '5\' 2"'$  (1.575 m)   Wt 155 lb (70.3 kg)   LMP 08/14/2016 (Exact Date)   BMI 28.35 kg/m?   ?Nursing note and vitals reviewed ?GEN: age-appropriate, [employer], resting comfortably in chair, NAD, WNWD ?HEENT: NCAT. PERRLA. Sclera without injection or icterus. MMM.  ?Neck: Supple. No LAD ?Cardiac: Regular rate and rhythm. Normal S1/S2. No murmurs, rubs, or gallops appreciated. 2+ radial  pulses. ?Lungs: Clear bilaterally to ascultation. No increased WOB, no accessory muscle usage. No w/r/r. ?Abdomen: Soft, non-distended, tender to palpation in LLQ, no rebound, guarding in LLQ. Normoactive bowel sounds.  ?Neuro: AOx3  ?Ext: no edema ?Psych: Pleasant, flat affect ? ?ASSESSMENT/PLAN:  ? ?Epiploic appendagitis ?Patient with epiploic appendagitis diagnosed on CT from 10/14/21. Patient is not worse, but is not significantly better in 2.5 weeks with conservative mgmt, still has significant pain on exam. Weight is reassuring, has increased 5 lbs. However, since patient continues to have symptoms, concerned that other complications could have developed. Will repeat CT a/p w/ contrast, gave patient number for surgery referral, will repeat labs to evaluate infection, liver/kidney function, r/o other causes of abdominal pain. No clinical signs of infection, abx not indicated at this time. Follow up if not improved in 1 week. Discussed strict return precautions. ?- repeat CT a/p w/ contrast ?- CBC, CMP, Lipase ?- Refill norco 5-325 mg q8h prn x3d  ?  ? ? ?Gladys Damme, MD ?[place]  ? ?

## 2021-10-31 NOTE — Assessment & Plan Note (Signed)
Patient with epiploic appendagitis diagnosed on CT from 10/14/21. Patient is not worse, but is not significantly better in 2.5 weeks with conservative mgmt, still has significant pain on exam. Weight is reassuring, has increased 5 lbs. However, since patient continues to have symptoms, concerned that other complications could have developed. Will repeat CT a/p w/ contrast, gave patient number for surgery referral, will repeat labs to evaluate infection, liver/kidney function, r/o other causes of abdominal pain. No clinical signs of infection, abx not indicated at this time. Follow up if not improved in 1 week. Discussed strict return precautions. ?- repeat CT a/p w/ contrast ?- CBC, CMP, Lipase ?- Refill norco 5-325 mg q8h prn x3d  ? ?

## 2021-11-01 DIAGNOSIS — R1032 Left lower quadrant pain: Secondary | ICD-10-CM | POA: Diagnosis not present

## 2021-11-01 LAB — CBC WITH DIFFERENTIAL/PLATELET
Basophils Absolute: 0.1 10*3/uL (ref 0.0–0.2)
Basos: 1 %
EOS (ABSOLUTE): 0.1 10*3/uL (ref 0.0–0.4)
Eos: 1 %
Hematocrit: 38.6 % (ref 34.0–46.6)
Hemoglobin: 13.1 g/dL (ref 11.1–15.9)
Immature Grans (Abs): 0 10*3/uL (ref 0.0–0.1)
Immature Granulocytes: 0 %
Lymphocytes Absolute: 2.4 10*3/uL (ref 0.7–3.1)
Lymphs: 33 %
MCH: 31 pg (ref 26.6–33.0)
MCHC: 33.9 g/dL (ref 31.5–35.7)
MCV: 92 fL (ref 79–97)
Monocytes Absolute: 0.6 10*3/uL (ref 0.1–0.9)
Monocytes: 9 %
Neutrophils Absolute: 4 10*3/uL (ref 1.4–7.0)
Neutrophils: 56 %
Platelets: 239 10*3/uL (ref 150–450)
RBC: 4.22 x10E6/uL (ref 3.77–5.28)
RDW: 11.7 % (ref 11.7–15.4)
WBC: 7.1 10*3/uL (ref 3.4–10.8)

## 2021-11-01 LAB — COMPREHENSIVE METABOLIC PANEL
ALT: 28 IU/L (ref 0–32)
AST: 20 IU/L (ref 0–40)
Albumin/Globulin Ratio: 1.8 (ref 1.2–2.2)
Albumin: 4.4 g/dL (ref 3.8–4.8)
Alkaline Phosphatase: 73 IU/L (ref 44–121)
BUN/Creatinine Ratio: 13 (ref 9–23)
BUN: 9 mg/dL (ref 6–20)
Bilirubin Total: 0.3 mg/dL (ref 0.0–1.2)
CO2: 23 mmol/L (ref 20–29)
Calcium: 9.6 mg/dL (ref 8.7–10.2)
Chloride: 104 mmol/L (ref 96–106)
Creatinine, Ser: 0.7 mg/dL (ref 0.57–1.00)
Globulin, Total: 2.5 g/dL (ref 1.5–4.5)
Glucose: 93 mg/dL (ref 70–99)
Potassium: 4.6 mmol/L (ref 3.5–5.2)
Sodium: 139 mmol/L (ref 134–144)
Total Protein: 6.9 g/dL (ref 6.0–8.5)
eGFR: 118 mL/min/{1.73_m2} (ref >59)

## 2021-11-01 LAB — LIPASE: Lipase: 17 U/L (ref 14–72)

## 2021-11-07 ENCOUNTER — Ambulatory Visit (HOSPITAL_COMMUNITY)
Admission: RE | Admit: 2021-11-07 | Discharge: 2021-11-07 | Disposition: A | Discharge status: Home / Self Care | Payer: Medicaid Other | Source: Ambulatory Visit | Attending: Family Medicine | Admitting: Family Medicine

## 2021-11-07 DIAGNOSIS — K6389 Other specified diseases of intestine: Secondary | ICD-10-CM | POA: Insufficient documentation

## 2021-11-07 DIAGNOSIS — R1032 Left lower quadrant pain: Secondary | ICD-10-CM | POA: Diagnosis not present

## 2021-11-07 HISTORY — DX: Left lower quadrant pain: R10.32

## 2021-11-07 MED ORDER — IOHEXOL 300 MG/ML  SOLN
100.0000 mL | Freq: Once | INTRAMUSCULAR | Status: AC | PRN
  Administered 2021-11-07: 100 mL via INTRAVENOUS

## 2021-11-29 ENCOUNTER — Telehealth: Payer: Self-pay

## 2021-11-29 NOTE — Telephone Encounter (Signed)
Patient called to see if Dr Maudie Mercury received some information regarding testing from Dr Esperanza Sheets Ward?  ? ? ?

## 2021-11-29 NOTE — Telephone Encounter (Signed)
Please call patient. ? ?I was able to find the notes on our computer sytem. ?Recommend she makes a follow up visit with me. ? ?Thank you.  ?

## 2021-11-29 NOTE — Telephone Encounter (Signed)
Spoken to patient and notified Dr Julianne Rice comments. Verbalized understanding. Appointment have been scheduled on 12/05/2021 ? ?

## 2021-12-05 ENCOUNTER — Ambulatory Visit: Payer: Medicaid Other | Admitting: Allergy

## 2021-12-05 NOTE — Progress Notes (Deleted)
Follow Up Note  RE: Michelle [name] MRN: [id number] DOB: 1991-02-09 Date of Office Visit: 12/05/2021  Referring provider: Orvis Brill, DO Primary care provider: Orvis Brill, DO  Chief Complaint: No chief complaint on file.  History of Present Illness: I had the pleasure of seeing Michelle [name] for a follow up visit at the [facility] on 12/05/2021. She is a 31 y.o. female, who is being followed for chronic urticaria, asthma,. Her previous allergy office visit was on 07/21/2021 with Michelle [name]. Today is a regular follow up visit.  Chronic urticaria Continue zyrtec (cetirizine) 58m 2 tablets twice a day. If hives are not controlled or causes drowsiness let [place]. Continue pepcid (famotidine) 275mtwice a day.  Avoid the following potential triggers: alcohol, tight clothing, NSAIDs, hot showers and getting overheated. Continue to consider Xolair injections. Information given along with side effects -Dr. KiMaudie Mercuryiscussed with patient at her last office visit that her tryptase level was borderline positive and with normal c kit there is low likelihood of having a mast cell disease. Even if she had a mast cell issue the initial treatment is antihistamines which she is on already.   At your request we will refer you to:  [place] [place] [place] [id number] Contact: Michelle SchirmerMD Professor of Pediatrics & Internal Medicine Division of Pediatric Allergy & Immunology Phone: [phone]: [phone]   Mild intermittent asthma May use albuterol rescue inhaler 2 puffs every 4 to 6 hours as needed for shortness of breath, chest tightness, coughing, and wheezing. Monitor frequency of use. Asthma control goals:  Full participation in all desired activities (may need albuterol before activity) Albuterol use two times or less a week on average (not counting  use with activity) Cough interfering with sleep two times or less a month Oral steroids no more than once a year No hospitalization   Bee sting  Avoid stinging insects. For mild symptoms you can take over the counter antihistamines such as Benadryl and monitor symptoms closely. If symptoms worsen or if you have severe symptoms including breathing issues, throat closure, significant swelling, whole body hives, severe diarrhea and vomiting, lightheadedness then inject epinephrine and seek immediate medical care afterwards.   Rash on right hand Continue with moisturization Try over the counter hydrocortisone cream and if it does not get better contact your dermatologist. If your symptoms re-occur, begin a journal of events that occurred for up to 6 hours before your symptoms began including foods and beverages consumed, soaps or perfumes you had contact with, and medications.      Follow up in 2 months or sooner if needed  Chronic urticaria Past history - Reaction to premade oatmeal cookies 1 year ago in the form of rash/hives. Patient used to tolerate these cookies with no issues. Then in March/April noticed difficulty breathing after eating eggs and on Easter had a hive outbreak after eating an egg. 2020 testing negative to foods. 2020 immunocap negative to foods as well. Slightly elevated tryptase, normal c kit.  Interim history - Was doing well until 1 week ago. No hives for 6 months and stopped antihistamines for 4 months. Possibly increased stress but no other changes. Continue zyrtec (cetirizine) 1015m tablets twice a day. If hives are not controlled or causes drowsiness let us [place]. Continue Pepcid (famotidine) 40m17mice a day.  Avoid the following potential triggers:  alcohol, tight clothing, NSAIDs, hot showers and getting overheated. Start prednisone taper.  If hives not controlled then will start on Xolair injections - handout given.  Discussed with patient that her tryptase level  was borderline positive and with normal c kit there is low likelihood of having a mast cell disease. Even if she had a mast cell issue the initial treatment is antihistamines which she is on already.  If symptoms not improving, then consider referral to mast cell specialist - closest one is in [place].    Mild intermittent asthma without complication Past history - Diagnosed with asthma over 10 years ago and currently has albuterol which helps. Interim history - no inhaler use.  May use albuterol rescue inhaler 2 puffs or nebulizer every 4 to 6 hours as needed for shortness of breath, chest tightness, coughing, and wheezing. May use albuterol rescue inhaler 2 puffs 5 to 15 minutes prior to strenuous physical activities. Monitor frequency of use.    Bee sting allergy Past history - Stung by an insect 6 months ago and had hives for 1 week. Needed steroid injection. Bloodwork slightly positive to honeybee. Interim history - no stings since the last visit.  Avoid stinging insects. For mild symptoms you can take over the counter antihistamines such as Benadryl and monitor symptoms closely. If symptoms worsen or if you have severe symptoms including breathing issues, throat closure, significant swelling, whole body hives, severe diarrhea and vomiting, lightheadedness then inject epinephrine and seek immediate medical care afterwards.   Return in about 2 months (around 03/07/2021).  Assessment and Plan: [name] is a 31 y.o. female with: No problem-specific Assessment & Plan notes found for this encounter.  No follow-ups on file.  No orders of the defined types were placed in this encounter.  Lab Orders  No laboratory test(s) ordered today    Diagnostics: Spirometry:  Tracings reviewed. Her effort: {Blank single:19197::"Good reproducible efforts.","It was hard to get consistent efforts and there is a question as to whether this reflects a maximal maneuver.","Poor effort, data can not be  interpreted."} FVC: ***L FEV1: ***L, ***% predicted FEV1/FVC ratio: ***% Interpretation: {Blank single:19197::"Spirometry consistent with mild obstructive disease","Spirometry consistent with moderate obstructive disease","Spirometry consistent with severe obstructive disease","Spirometry consistent with possible restrictive disease","Spirometry consistent with mixed obstructive and restrictive disease","Spirometry uninterpretable due to technique","Spirometry consistent with normal pattern","No overt abnormalities noted given today's efforts"}.  Please see scanned spirometry results for details.  Skin Testing: {Blank single:19197::"Select foods","Environmental allergy panel","Environmental allergy panel and select foods","Food allergy panel","None","Deferred due to recent antihistamines use"}. *** Results discussed with patient/family.   Medication List:  Current Outpatient Medications  Medication Sig Dispense Refill  . albuterol (VENTOLIN HFA) 108 (90 Base) MCG/ACT inhaler Inhale 2 puffs into the lungs every 4 (four) hours as needed for wheezing or shortness of breath. 18 g 1  . cetirizine (ZYRTEC) 10 MG tablet TAKE 2 TABLETS(20 MG) BY MOUTH TWICE DAILY 120 tablet 5  . EPINEPHrine (EPIPEN 2-PAK) 0.3 mg/0.3 mL IJ SOAJ injection Inject 0.3 mg into the muscle once as needed for anaphylaxis. 2 each 1  . famotidine (PEPCID) 20 MG tablet Take 1 tablet (20 mg total) by mouth 2 (two) times daily. 60 tablet 5  . ibuprofen (ADVIL) 800 MG tablet Take 1 tablet (800 mg total) by mouth every 6 (six) hours as needed for moderate pain. 20 tablet 0  . Multiple Vitamin (MULTIVITAMIN ADULT PO) Take by mouth. (Patient not taking: Reported on 07/21/2021)    . ondansetron (ZOFRAN) 4 MG  tablet Take 1 tablet (4 mg total) by mouth every 8 (eight) hours as needed for nausea or vomiting. 20 tablet 0  . polyethylene glycol powder (GLYCOLAX/MIRALAX) 17 GM/SCOOP powder Take 17 g by mouth daily. 500 g 0   No current  facility-administered medications for this visit.   Allergies: Allergies  Allergen Reactions  . Adhesive [Tape] Hives and Other (See Comments)    EKG or heart monitor pads- caused raised, red areas (WELTS) on the skin; "sensitive" pads fell off  . Bee Venom Hives and Other (See Comments)    Welts, also   I reviewed her past medical history, social history, family history, and environmental history and no significant changes have been reported from her previous visit.  Review of Systems  Constitutional:  Negative for appetite change, chills, fever and unexpected weight change.  HENT:  Negative for congestion, rhinorrhea and sneezing.   Eyes:  Negative for itching.  Respiratory:  Negative for cough, chest tightness, shortness of breath and wheezing.   Cardiovascular:  Negative for chest pain.  Gastrointestinal:  Negative for abdominal pain.  Genitourinary:  Negative for difficulty urinating.  Skin:  Positive for rash.  Allergic/Immunologic: Negative for environmental allergies.  Neurological:  Negative for headaches.   Objective: LMP 08/14/2016 (Exact Date)  There is no height or weight on file to calculate BMI. Physical Exam Vitals and nursing note reviewed.  Constitutional:      Appearance: She is well-developed.  HENT:     Head: Normocephalic and atraumatic.     Right Ear: Tympanic membrane and external ear normal.     Left Ear: Tympanic membrane and external ear normal.     Nose: Nose normal.     Mouth/Throat:     Mouth: Mucous membranes are moist.  Eyes:     Conjunctiva/sclera: Conjunctivae normal.  Cardiovascular:     Rate and Rhythm: Normal rate and regular rhythm.     Heart sounds: Normal heart sounds. No murmur heard.   No friction rub. No gallop.  Pulmonary:     Effort: Pulmonary effort is normal.     Breath sounds: Normal breath sounds. No wheezing or rales.  Musculoskeletal:     Cervical back: Neck supple.  Skin:    General: Skin is warm.     Findings: No  rash.     Comments: No hives on exam but excoriations marks on lower extremities.  Neurological:     Mental Status: She is alert and oriented to person, place, and time.  Psychiatric:        Behavior: Behavior normal.  Previous notes and tests were reviewed. The plan was reviewed with the patient/family, and all questions/concerned were addressed.  It was my pleasure to see Shamra today and participate in her care. Please feel free to contact me with any questions or concerns.  Sincerely,  Rexene Alberts, DO Allergy & Immunology  [facility] office: [place] office: [phone]

## 2021-12-20 ENCOUNTER — Encounter: Payer: Self-pay | Admitting: *Deleted

## 2021-12-25 ENCOUNTER — Emergency Department (HOSPITAL_COMMUNITY)
Admission: EM | Admit: 2021-12-25 | Discharge: 2021-12-25 | Disposition: A | Discharge status: Home / Self Care | Payer: Medicaid Other | Attending: Emergency Medicine | Admitting: Emergency Medicine

## 2021-12-25 ENCOUNTER — Emergency Department (HOSPITAL_COMMUNITY): Payer: Medicaid Other

## 2021-12-25 ENCOUNTER — Emergency Department (HOSPITAL_BASED_OUTPATIENT_CLINIC_OR_DEPARTMENT_OTHER)
Admission: EM | Admit: 2021-12-25 | Discharge: 2021-12-25 | Disposition: A | Discharge status: Home / Self Care | Payer: Medicaid Other | Source: Home / Self Care

## 2021-12-25 ENCOUNTER — Other Ambulatory Visit: Payer: Self-pay

## 2021-12-25 ENCOUNTER — Encounter (HOSPITAL_BASED_OUTPATIENT_CLINIC_OR_DEPARTMENT_OTHER): Payer: Self-pay

## 2021-12-25 ENCOUNTER — Encounter (HOSPITAL_COMMUNITY): Payer: Self-pay | Admitting: Emergency Medicine

## 2021-12-25 DIAGNOSIS — R110 Nausea: Secondary | ICD-10-CM | POA: Insufficient documentation

## 2021-12-25 DIAGNOSIS — R079 Chest pain, unspecified: Secondary | ICD-10-CM | POA: Diagnosis not present

## 2021-12-25 DIAGNOSIS — M25511 Pain in right shoulder: Secondary | ICD-10-CM | POA: Insufficient documentation

## 2021-12-25 DIAGNOSIS — K30 Functional dyspepsia: Secondary | ICD-10-CM | POA: Insufficient documentation

## 2021-12-25 DIAGNOSIS — R0789 Other chest pain: Secondary | ICD-10-CM | POA: Diagnosis not present

## 2021-12-25 DIAGNOSIS — Z5321 Procedure and treatment not carried out due to patient leaving prior to being seen by health care provider: Secondary | ICD-10-CM | POA: Insufficient documentation

## 2021-12-25 LAB — CBC
HCT: 41.3 % (ref 36.0–46.0)
Hemoglobin: 13.8 g/dL (ref 12.0–15.0)
MCH: 31.4 pg (ref 26.0–34.0)
MCHC: 33.4 g/dL (ref 30.0–36.0)
MCV: 93.9 fL (ref 80.0–100.0)
Platelets: 231 10*3/uL (ref 150–400)
RBC: 4.4 MIL/uL (ref 3.87–5.11)
RDW: 11.7 % (ref 11.5–15.5)
WBC: 8.2 10*3/uL (ref 4.0–10.5)
nRBC: 0 % (ref 0.0–0.2)

## 2021-12-25 LAB — COMPREHENSIVE METABOLIC PANEL
ALT: 25 U/L (ref 0–44)
AST: 25 U/L (ref 15–41)
Albumin: 3.9 g/dL (ref 3.5–5.0)
Alkaline Phosphatase: 64 U/L (ref 38–126)
Anion gap: 9 (ref 5–15)
BUN: 10 mg/dL (ref 6–20)
CO2: 24 mmol/L (ref 22–32)
Calcium: 9.1 mg/dL (ref 8.9–10.3)
Chloride: 107 mmol/L (ref 98–111)
Creatinine, Ser: 0.91 mg/dL (ref 0.44–1.00)
GFR, Estimated: >60 mL/min (ref >60)
Glucose, Bld: 117 mg/dL — ABNORMAL HIGH (ref 70–99)
Potassium: 3.5 mmol/L (ref 3.5–5.1)
Sodium: 140 mmol/L (ref 135–145)
Total Bilirubin: 0.9 mg/dL (ref 0.3–1.2)
Total Protein: 7.1 g/dL (ref 6.5–8.1)

## 2021-12-25 LAB — LIPASE, BLOOD: Lipase: 30 U/L (ref 11–51)

## 2021-12-25 LAB — URINALYSIS, ROUTINE W REFLEX MICROSCOPIC
Bilirubin Urine: NEGATIVE
Glucose, UA: NEGATIVE mg/dL
Hgb urine dipstick: NEGATIVE
Ketones, ur: NEGATIVE mg/dL
Leukocytes,Ua: NEGATIVE
Nitrite: NEGATIVE
Protein, ur: NEGATIVE mg/dL
Specific Gravity, Urine: 1.003 — ABNORMAL LOW (ref 1.005–1.030)
pH: 7 (ref 5.0–8.0)

## 2021-12-25 LAB — PREGNANCY, URINE: Preg Test, Ur: NEGATIVE

## 2021-12-25 LAB — D-DIMER, QUANTITATIVE: D-Dimer, Quant: 0.4 ug/mL-FEU (ref 0.00–0.50)

## 2021-12-25 LAB — TROPONIN I (HIGH SENSITIVITY): Troponin I (High Sensitivity): <2 ng/L (ref <18)

## 2021-12-25 NOTE — ED Provider Triage Note (Signed)
  Emergency Medicine Provider Triage Evaluation Note  MRN:  [id number]  Arrival date & time: 12/25/21    Medically screening exam initiated at 10:28 PM.   CC:   Chest Pain   HPI:  Michelle [name] is a 31 y.o. year-old female presents to the ED with chief complaint of right sided chest pain that radiates to the back.  Started earlier today.  Has been intermittent sharp/stabbing pain.  Denies SOB.  Denies abdominal pains.  Denies any injuries.  History provided by History provided by patient. ROS:  -As included in HPI PE:   Vitals:   12/25/21 2215  BP: 131/82  Pulse: (!) 116  Resp: 18  Temp: 98.4 F (36.9 C)  SpO2: 100%    Non-toxic appearing No respiratory distress No focal abdominal tenderness MDM:  Broad ddx at this point, msk strain, CAP, PE, gallbladder, KS. I've ordered labs and imaging in triage to expedite lab/diagnostic workup.  Patient was informed that the remainder of the evaluation will be completed by another provider, this initial triage assessment does not replace that evaluation, and the importance of remaining in the ED until their evaluation is complete.    Montine Circle, PA-C 12/25/21 2231

## 2021-12-25 NOTE — ED Notes (Signed)
Patient states she is leaving to go to a different facility

## 2021-12-25 NOTE — ED Triage Notes (Signed)
POV, 330 right sided chest pain and shoulder blade pain, pt sts indigestion and nausea earlier but has since improved, alert and oriented x 4, ambulatory to triage. [facility] at [facility] and had normal ekg and blood work was completed.

## 2021-12-25 NOTE — ED Triage Notes (Addendum)
Pt reported to ED with c/o right sided chest pain that radiates into back. States pain has been ongoing since approximately 330 pm today. Pt also endorses indigestion and nausea. Denies hx of gallbladder.

## 2021-12-26 LAB — I-STAT BETA HCG BLOOD, ED (MC, WL, AP ONLY): I-stat hCG, quantitative: <5 m[IU]/mL (ref <5)

## 2022-01-02 ENCOUNTER — Other Ambulatory Visit: Payer: Self-pay

## 2022-01-02 ENCOUNTER — Ambulatory Visit (INDEPENDENT_AMBULATORY_CARE_PROVIDER_SITE_OTHER): Payer: Medicaid Other | Admitting: Family Medicine

## 2022-01-02 VITALS — BP 129/76 | HR 97 | Wt 155.8 lb

## 2022-01-02 DIAGNOSIS — J0100 Acute maxillary sinusitis, unspecified: Secondary | ICD-10-CM | POA: Diagnosis not present

## 2022-01-02 DIAGNOSIS — J329 Chronic sinusitis, unspecified: Secondary | ICD-10-CM

## 2022-01-02 HISTORY — DX: Chronic sinusitis, unspecified: J32.9

## 2022-01-02 MED ORDER — AMOXICILLIN-POT CLAVULANATE 875-125 MG PO TABS: 1.0000 | ORAL_TABLET | Freq: Two times a day (BID) | ORAL | 0 refills | Status: DC

## 2022-01-02 NOTE — Progress Notes (Signed)
    SUBJECTIVE:   CHIEF COMPLAINT / HPI:   Sinus Infection States that symptoms have been ongoing for the last 2 weeks. Has congestion, cold, headache, fullness in both ears. Has tried Tylenol cold/flu without much improvement so she switched to just Tylenol for the headache. Has had sinus infections in the past but it is not a recurrent issue for her. This feels similar to when she has had one previously.  No fevers. No N/V. Has productive greenish/gray cough. Daughter and son were also sick but are doing well.   PERTINENT  PMH / PSH:  Migraine, GERD, epiploic appendagitis, anxiety, PTSD, former tobacco use- quit in 2018. Currently vaping- 1 puff every 3 hours.   OBJECTIVE:   BP 129/76   Pulse 97   Wt 155 lb 12.8 oz (70.7 kg)   LMP 08/14/2016 (Exact Date)   SpO2 100%   BMI 28.50 kg/m    General: NAD, pleasant, able to participate in exam HEENT: Normocephalic, TM clear b/l, nose is congested with clear rhinorrhea present, oropharynx s/p tonsillectomy. Mild tenderness to palpation of maxillary sinuses  Cardiac: RRR, no murmurs. Respiratory: CTAB, normal effort, No wheezes, rales or rhonchi Psych: Normal affect and mood  ASSESSMENT/PLAN:   Sinus infection Will treat with Augmentin given ongoing symptoms >10 days with green productive mucus, headache, fullness and tenderness to maxillary sinuses.  -Augmentin x10 days -Return if no improvement    Sharion Settler, [place]

## 2022-01-02 NOTE — Patient Instructions (Signed)
It was wonderful to see you today.  Please bring ALL of your medications with you to every visit.   Today we talked about:  -You have a sinus infection. I have sent in antibiotics to your pharmacy. Take them twice a day (every 12 hours) for 7 days. -If your symptoms don't improve, please come back. -You can do nasal saline sprays for congestion.  -Stay hydrated! -Work towards cutting back on vaping, let us know if we can help.   Thank you for choosing [facility].   Please call [phone] with any questions about today's appointment.  Please be sure to schedule follow up at the front  desk before you leave today.   Sharion Settler, DO PGY-2 Family Medicine

## 2022-01-02 NOTE — Assessment & Plan Note (Signed)
Will treat with Augmentin given ongoing symptoms >10 days with green productive mucus, headache, fullness and tenderness to maxillary sinuses.  -Augmentin x10 days -Return if no improvement

## 2022-01-04 ENCOUNTER — Ambulatory Visit: Payer: Medicaid Other | Admitting: Allergy

## 2022-01-07 ENCOUNTER — Ambulatory Visit
Admission: EM | Admit: 2022-01-07 | Discharge: 2022-01-07 | Disposition: A | Discharge status: Home / Self Care | Payer: Medicaid Other | Attending: Family Medicine | Admitting: Family Medicine

## 2022-01-07 DIAGNOSIS — Z889 Allergy status to unspecified drugs, medicaments and biological substances status: Secondary | ICD-10-CM

## 2022-01-07 MED ORDER — TRIAMCINOLONE ACETONIDE 40 MG/ML IJ SUSP
40.0000 mg | Freq: Once | INTRAMUSCULAR | Status: AC
  Administered 2022-01-07: 40 mg via INTRAMUSCULAR

## 2022-01-07 MED ORDER — PREDNISONE 20 MG PO TABS
40.0000 mg | ORAL_TABLET | Freq: Every day | ORAL | 0 refills | Status: AC
Start: 2022-01-07 — End: 2022-01-12

## 2022-01-07 NOTE — ED Triage Notes (Signed)
Pt said she noticed about 1 hr ago that she had a rash on her abdomen and chest area, that is very itchy. Pt said she has been taking an antibiotic x 5 days for sinus infection. Only thing new

## 2022-01-10 ENCOUNTER — Ambulatory Visit (HOSPITAL_BASED_OUTPATIENT_CLINIC_OR_DEPARTMENT_OTHER): Payer: Medicaid Other | Admitting: Obstetrics & Gynecology

## 2022-02-05 ENCOUNTER — Other Ambulatory Visit: Payer: Self-pay

## 2022-02-05 ENCOUNTER — Encounter (HOSPITAL_BASED_OUTPATIENT_CLINIC_OR_DEPARTMENT_OTHER): Payer: Self-pay | Admitting: Obstetrics and Gynecology

## 2022-02-05 ENCOUNTER — Emergency Department (HOSPITAL_BASED_OUTPATIENT_CLINIC_OR_DEPARTMENT_OTHER)
Admission: EM | Admit: 2022-02-05 | Discharge: 2022-02-05 | Disposition: A | Discharge status: Home / Self Care | Payer: Medicaid Other | Attending: Emergency Medicine | Admitting: Emergency Medicine

## 2022-02-05 DIAGNOSIS — I10 Essential (primary) hypertension: Secondary | ICD-10-CM | POA: Diagnosis not present

## 2022-02-05 DIAGNOSIS — J45909 Unspecified asthma, uncomplicated: Secondary | ICD-10-CM | POA: Insufficient documentation

## 2022-02-05 DIAGNOSIS — J029 Acute pharyngitis, unspecified: Secondary | ICD-10-CM | POA: Insufficient documentation

## 2022-02-05 DIAGNOSIS — R000 Tachycardia, unspecified: Secondary | ICD-10-CM | POA: Diagnosis not present

## 2022-02-05 DIAGNOSIS — Z7951 Long term (current) use of inhaled steroids: Secondary | ICD-10-CM | POA: Diagnosis not present
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
## 2022-02-06 ENCOUNTER — Telehealth: Payer: Self-pay | Admitting: *Deleted

## 2022-02-06 NOTE — Patient Outreach (Signed)
Care Coordination  02/06/2022  Michelle [name] [birth year] [id number]  Transition Care Management Follow-up Telephone Call Date of discharge and from where: 02/05/22, [facility] How have you been since you were released from the hospital? "I'm fine" Any questions or concerns? No  Items Reviewed: Did the pt receive and understand the discharge instructions provided? Yes  Medications obtained and verified?  No new medications prescribed Other? No  Any new allergies since your discharge? No  Dietary orders reviewed? No Do you have support at home? Yes   Home Care and Equipment/Supplies: Were home health services ordered? no If so, what is the name of the agency? N/A  Has the agency set up a time to come to the patient's home? not applicable Were any new equipment or medical supplies ordered?  No What is the name of the medical supply agency? N/A Were you able to get the supplies/equipment? not applicable Do you have any questions related to the use of the equipment or supplies? No  Functional Questionnaire: (I = Independent and D = Dependent) ADLs: I  Bathing/Dressing- I  Meal Prep- I  Eating- I  Maintaining continence- I  Transferring/Ambulation- I  Managing Meds- I  Follow up appointments reviewed:  PCP Hospital f/u appt confirmed? No  Patient will call today to schedule a follow up visit with PCP . [facility] f/u appt confirmed? No  N/A. Are transportation arrangements needed? No  If their condition worsens, is the pt aware to call PCP or go to the Emergency Dept.? Yes Was the patient provided with contact information for the PCP's office or ED? No Was to pt encouraged to call back with questions or concerns? Yes

## 2022-03-14 ENCOUNTER — Other Ambulatory Visit: Payer: Self-pay | Admitting: Allergy

## 2022-03-21 ENCOUNTER — Encounter: Payer: Self-pay | Admitting: Emergency Medicine

## 2022-03-21 ENCOUNTER — Ambulatory Visit
Admission: EM | Admit: 2022-03-21 | Discharge: 2022-03-21 | Disposition: A | Discharge status: Home / Self Care | Payer: Medicaid Other | Attending: Internal Medicine | Admitting: Internal Medicine

## 2022-03-21 DIAGNOSIS — S30861A Insect bite (nonvenomous) of abdominal wall, initial encounter: Secondary | ICD-10-CM | POA: Diagnosis not present

## 2022-03-21 DIAGNOSIS — W57XXXA Bitten or stung by nonvenomous insect and other nonvenomous arthropods, initial encounter: Secondary | ICD-10-CM | POA: Diagnosis not present

## 2022-03-21 DIAGNOSIS — G43019 Migraine without aura, intractable, without status migrainosus: Secondary | ICD-10-CM | POA: Diagnosis not present

## 2022-03-21 MED ORDER — ONDANSETRON 4 MG PO TBDP
4.0000 mg | ORAL_TABLET | Freq: Once | ORAL | Status: AC
  Administered 2022-03-21: 4 mg via ORAL

## 2022-03-21 MED ORDER — TRIAMCINOLONE ACETONIDE 0.1 % EX CREA: 1.0000 | TOPICAL_CREAM | Freq: Two times a day (BID) | CUTANEOUS | 0 refills | Status: DC

## 2022-03-21 MED ORDER — DEXAMETHASONE SODIUM PHOSPHATE 10 MG/ML IJ SOLN
10.0000 mg | Freq: Once | INTRAMUSCULAR | Status: AC
  Administered 2022-03-21: 10 mg via INTRAMUSCULAR

## 2022-03-21 MED ORDER — KETOROLAC TROMETHAMINE 30 MG/ML IJ SOLN
30.0000 mg | Freq: Once | INTRAMUSCULAR | Status: AC
  Administered 2022-03-21: 30 mg via INTRAMUSCULAR

## 2022-03-21 NOTE — Discharge Instructions (Signed)
You were given a migraine cocktail in urgent care today.  Please go to the emergency department if symptoms persist or worsen in the next 24 hours.  Topical cream has been prescribed to apply to insect bite area.  Please follow-up if symptoms persist or worsen with this.

## 2022-03-21 NOTE — Progress Notes (Unsigned)
Follow Up Note  RE: Michelle [name] MRN: [id number] DOB: Aug 26, 1990 Date of Office Visit: 03/22/2022  Referring provider: Orvis Brill, DO Primary care provider: Orvis Brill, DO  Chief Complaint: Urticaria (No issues within the last month ) and Asthma (No issues )  History of Present Illness: I had the pleasure of seeing Michelle [name] for a follow up visit at the [facility] on 03/22/2022. She is a 31 y.o. female, who is being followed for chronic urticaria, asthma,bee sting sensitivity. Her previous allergy office visit was on 07/21/2021 with Michelle Charon FNP. Today is a regular follow up visit.  Chronic urticaria No hives this month but had prednisone in July/August due to a drug rash (caused by Augmentin) and had a steroid injection yesterday for migraine.  Still taking zyrtec 71m BID, famotidine 226mBID.  She does break out if she misses the meds for about 3 days.  Hesitant about starting Xolair.   [facility] - I will order the 2 bloodwork, the genetic testing is self-pay and patient not sure if she can afford right now. Discussed bone marrow biopsy as well and decided to get bloodwork first.    Mild intermittent asthma Denies any SOB, coughing, wheezing, chest tightness, nocturnal awakenings, ER/urgent care visits or prednisone use since the last visit.  No inhaler use for a few months.  Bee sting  No stings since the last visit.   Patient had a sinus infection and broke out in a rash after taking Augmentin for 3 days. No prior issues with Augmentin.  11/22/2021 [facility] allergy note: "Assessment/Plan   Michelle [name] a [age].o. female with history of EDS, bilateral medullary sponge kidney disease, mild intermittent asthma, chronic rhinitis who is here via telemedicine video visit for evaluation of chronic spontaneous urticaria.   Chronic Spontaneous  urticaria  Patient had has hives on weekly basic without consistent trigger Previous skin allergy testing was negative and serum IgE was overall negative but honeybee was 0.19 Discussed that this history is most consistent with chronic idiopathic (spontaneous) urticaria CIU is a quasi-autoimmune disease, and as such is not caused by exposure to allergens However, some agents may increase the likelihood or severity of hives in certain patients, such as stress, NSAIDs, alcohol, etc. Given the concern for other mast cell disorders (see below), checking for urticaria-associated autoantibodies may be helpful Recommend checking Chronic Urticaria Index, available from [facility] (test ID: [id number] [facility] (test ID: [id number] or [facility] ([id number] that a negative test does NOT rule-out CIU, but a positive test confirms it Recommend continuing current antihistamine regimen for now Continue Zyrtec 2083mO twice daily Continue Pepcid 36m45m twice daily Because she continues to experience breakthrough on antihistamines, discussed potentially starting Xolair, and she would like time to consider starting the medication with her local allergist Discussed low risk for cancer in many years of clinical followup after FDA approval Discussed side effects of hair loss and allergic reactions that occurs with 1st to 2nd dose.   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 Discussed what is known about the pathophysiology of HaT, that it is more so a biochemical trait, and patients don't always have symptoms  These duplications are carried on a single chromosome and can be inherited from parent to child In some cases, both parents can carry the duplication, so that a child  could have multiple copies Nonspecific symptoms including GI, joint, cutaneous and cardiovascular manifestation may be linked to the over-expression of alpha-tryptase but are NOT always be caused by HAT There is increased association between patients with HAT and POTS +/- EDS along with other medical conditions  Discussed with patient that given the high tryptase level, it is reccommended to obtain genetic testing for TPSAB1 copy number However, testing is currently only available out-of-pocket, and she is unable to perform now due to financial hurdles   Rule out systemic mastocytosis Elevated baseline tryptase can be seen in systemic mastocytosis, though lower suspicion overall for systemic mastocytosis at this time based on her history and symptoms Records indicate that KIT sequencing was performed on peripheral blood, but sequencing of the gene (typically done for leukemia workup) is not sensitive enough to pick up rare SM cells Recommend checking allele-specific digital droplet PCR for KIT D816V, available through [facility] (test ID: [id number]), [facility] (test ID: [id number]), or [facility] (test ID: [id number]) Discussed that this test may miss some cases of SM in which the mastocytosis cells are confined to the bone marrow Discussed that bone marrow biopsy may not be needed given the likelihood of HaT (see above), but she is still very interested in this to help rule out SM Bone marrow should be stained for CD117 and tryptase to help identify the number and morphology of mast cells Marrow sample should also be stained for CD2 and CD25 to look for aberrant expression of these markes Please also specify that allele-specific digital droplet PCR for KIT D816V is performed on the marrow sample (see above for test codes) Mast cell-specific stains can be performed after-the-fact on fixed tissue, but D816V PCR must be sent on fresh sample at time of collection  Mild intermittent asthma without complication:  Well  controlled, no recent exacerbations or hospitalizations Continue albuterol MDI, 2 puff inhaled every 4-6 hours as needed for wheezing  Chronic Rhinitis Currently well controlled, asymptomatic for 2 years  Continue zyrtec, as above"  Assessment and Plan: Michelle [name] is a 31 y.o. female with: Chronic urticaria Past history - Reaction to premade oatmeal cookies 1 year ago in the form of rash/hives. Patient used to tolerate these cookies with no issues. Then in March/April noticed difficulty breathing after eating eggs and on Easter had a hive outbreak after eating an egg. 2020 testing negative to foods. 2020 immunocap negative to foods as well. Slightly elevated tryptase, normal c kit.  Interim history - breaks out if misses antihistamines for more than 3 days. Had televisit with mast cell specialist (Dr. Leonides Schanz) at [facility].  Continue zyrtec (cetirizine) 22m 2 tablets twice a day. If hives are not controlled or causes drowsiness let u[place]ore[place]now. Continue pepcid (famotidine) 258mtwice a day.  Avoid the following potential triggers: alcohol, tight clothing, NSAIDs, hot showers and getting overheated. Consider Xolair injections. Get bloodwork for CU index and pcr KIT D816V as recommended by Dr. WaLeonides SchanzPatient unable to afford HAT testing. Will hold off bone marrow biopsy for now - if patient interested will need referral to heme/onc.   Mild intermittent asthma without complication Past history - Diagnosed with asthma over 10 years ago and currently has albuterol which helps. Interim history - no inhaler use. Asymptomatic.  Normal spirometry today. May use albuterol rescue inhaler 2 puffs or  nebulizer every 4 to 6 hours as needed for shortness of breath, chest tightness, coughing, and wheezing. May use albuterol rescue inhaler 2 puffs 5 to 15 minutes prior to strenuous physical activities. Monitor frequency of use.   Bee sting allergy Past history - Stung by an insect 6 months ago and had hives for 1 week.  Needed steroid injection. Bloodwork slightly positive to honeybee. Interim history - no stings since the last visit.  Avoid stinging insects. For mild symptoms you can take over the counter antihistamines such as Benadryl and monitor symptoms closely. If symptoms worsen or if you have severe symptoms including breathing issues, throat closure, significant swelling, whole body hives, severe diarrhea and vomiting, lightheadedness then inject epinephrine and seek immediate medical care afterwards.  Penicillin allergy Broke out in rash after taking Augmentin x 3 days for sinusitis. No prior issues with Augmentin. Discussed with patient that sometimes the infection itself can cause rashes. Continue to avoid penicillin type of antibiotics for now. Consider penicillin skin testing/drug challenge in the future.  Return in about 2 months (around 05/22/2022).  Meds ordered this encounter  Medications   EPINEPHrine (EPIPEN 2-PAK) 0.3 mg/0.3 mL IJ SOAJ injection    Sig: Inject 0.3 mg into the muscle once as needed for anaphylaxis.    Dispense:  2 each    Refill:  1    Please dispense Mylan or Teva brand generic only.   Lab Orders         Other/Misc lab test         Chronic Urticaria index panel      Diagnostics: Spirometry:  Tracings reviewed. Her effort: Good reproducible efforts. FVC: 3.70L FEV1: 3.13L, 106% predicted FEV1/FVC ratio: 85% Interpretation: Spirometry consistent with normal pattern.  Please see scanned spirometry results for details.   Medication List:  Current Outpatient Medications  Medication Sig Dispense Refill   albuterol (VENTOLIN HFA) 108 (90 Base) MCG/ACT inhaler Inhale 2 puffs into the lungs every 4 (four) hours as needed for wheezing or shortness of breath. 18 g 1   cetirizine (ZYRTEC) 10 MG tablet TAKE 2 TABLETS(20 MG) BY MOUTH TWICE DAILY 120 tablet 5   famotidine (PEPCID) 20 MG tablet Take 1 tablet (20 mg total) by mouth 2 (two) times daily. 60 tablet 5    ibuprofen (ADVIL) 800 MG tablet Take 1 tablet (800 mg total) by mouth every 6 (six) hours as needed for moderate pain. 20 tablet 0   triamcinolone cream (KENALOG) 0.1 % Apply 1 Application topically 2 (two) times daily. 30 g 0   EPINEPHrine (EPIPEN 2-PAK) 0.3 mg/0.3 mL IJ SOAJ injection Inject 0.3 mg into the muscle once as needed for anaphylaxis. 2 each 1   No current facility-administered medications for this visit.   Allergies: Allergies  Allergen Reactions   Adhesive [Tape] Hives and Other (See Comments)    EKG or heart monitor pads- caused raised, red areas (WELTS) on the skin; "sensitive" pads fell off   Bee Venom Hives and Other (See Comments)    Welts, also   Amoxil [Amoxicillin] Rash   I reviewed her past medical history, social history, family history, and environmental history and no significant changes have been reported from her previous visit.  Review of Systems  Constitutional:  Negative for appetite change, chills, fever and unexpected weight change.  HENT:  Negative for congestion, rhinorrhea and sneezing.   Eyes:  Negative for itching.  Respiratory:  Negative for cough, chest tightness, shortness of breath and wheezing.  Cardiovascular:  Negative for chest pain.  Gastrointestinal:  Negative for abdominal pain.  Genitourinary:  Negative for difficulty urinating.  Skin:  Negative for rash.  Allergic/Immunologic: Negative for environmental allergies.  Neurological:  Positive for headaches.    Objective: BP 122/82   Pulse 86   Temp 99.2 F (37.3 C)   Resp 18   Ht '5\' 2"'  (1.575 m)   Wt 155 lb (70.3 kg)   LMP 08/14/2016 (Exact Date)   SpO2 97%   BMI 28.35 kg/m  Body mass index is 28.35 kg/m. Physical Exam Vitals and nursing note reviewed.  Constitutional:      Appearance: She is well-developed.  HENT:     Head: Normocephalic and atraumatic.     Right Ear: Tympanic membrane and external ear normal.     Left Ear: Tympanic membrane and external ear normal.      Nose: Nose normal.     Mouth/Throat:     Mouth: Mucous membranes are moist.  Eyes:     Conjunctiva/sclera: Conjunctivae normal.  Cardiovascular:     Rate and Rhythm: Normal rate and regular rhythm.     Heart sounds: Normal heart sounds. No murmur heard.    No friction rub. No gallop.  Pulmonary:     Effort: Pulmonary effort is normal.     Breath sounds: Normal breath sounds. No wheezing or rales.  Musculoskeletal:     Cervical back: Neck supple.  Skin:    General: Skin is warm.     Findings: No rash.  Neurological:     Mental Status: She is alert and oriented to person, place, and time.  Psychiatric:        Behavior: Behavior normal.    Previous notes and tests were reviewed. The plan was reviewed with the patient/family, and all questions/concerned were addressed.  It was my pleasure to see Michelle [name] today and participate in her care. Please feel free to contact me with any questions or concerns.  Sincerely,  Rexene Alberts, DO Allergy & Immunology  [facility] office: [place] office: [phone]

## 2022-03-21 NOTE — ED Provider Notes (Signed)
[facility]    CSN: [id number] Arrival date & time: 03/21/22  1307      History   Chief Complaint Chief Complaint  Patient presents with   Migraine   Insect Bite    HPI Michelle [name] is a 31 y.o. female.   Patient presents with 2 different chief complaints today.  Patient reports migraine headache that started yesterday.  Headache is present on the right side of the head and she has associated nausea without vomiting and occasional dizziness.  Patient reports that is typical as she has a history of migraines.  Patient has taken Tylenol with minimal improvement.  She is followed by neurology.  States that she used to take nortriptyline but no lower takes it as her migraines were controlled.  She typically sees neurology and is given migraine cocktail with improvement of symptoms.  Denies any blurry vision, chest pain, shortness of breath.  Denies any recent head injuries or falls.  Also reports possible insect bite to other abdomen.  Patient is not sure what bit her and did not witness it.  Has area of redness with itchiness present to abdominal wall.  Patient has not taken any medications or applied any creams to affected area.   Migraine    Past Medical History:  Diagnosis Date   Anxiety    Asthma    Bipolar disorder ([place])    no current med.   Depression    no current med.   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   Migraine    Nephrolithiasis    PTSD (post-traumatic stress disorder)    PTSD (post-traumatic stress disorder)    Pyelonephritis 01/16/2019   Seizures ([place])    Tachycardia    TMJ (dislocation of  temporomandibular joint)    Vertigo     Patient Active Problem List   Diagnosis Date Noted   Sinus infection 01/02/2022   Epiploic appendagitis 10/18/2021   Sacroiliac joint dysfunction of right side 10/17/2021   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   Seborrheic dermatitis 09/22/2021   Other fatigue 09/11/2021   Hair loss 09/11/2021   Constipation 08/30/2021   Dizziness 02/10/2021   Hypermobility syndrome 10/04/2020   Encounter for pre-operative examination 08/17/2020   Nipple discharge in female 06/04/2020   GERD (gastroesophageal reflux disease) 06/04/2020   Labral tear of right hip joint 04/12/2020   Right hip pain 02/03/2020   Finger pain, right 10/23/2019   Eustachian tube dysfunction, bilateral 12/16/2018   Bee sting allergy 11/06/2018   Allergic reaction 11/04/2018   Mild intermittent asthma without complication 29/92/4268   Chronic rhinitis 11/04/2018   Chronic urticaria 10/04/2018   Rash 11/16/2017   Easy bruising 11/16/2017   Anxiety 10/27/2016   SUI (stress urinary incontinence, female) 04/10/2016   Migraine 02/10/2016   Lipoma 08/23/2015   PTSD 03/02/2010   Tobacco use disorder 02/07/2008   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    Past Surgical History:  Procedure Laterality Date   ABDOMINAL HYSTERECTOMY     ADENOIDECTOMY, TONSILLECTOMY AND MYRINGOTOMY WITH TUBE PLACEMENT  1990's   APPENDECTOMY  CESAREAN SECTION  06/20/2012   Procedure: CESAREAN SECTION;  Surgeon: Emily Filbert, MD;  Location: [facility];  Service: Obstetrics;  Laterality: N/A;   HIP SURGERY     LAPAROSCOPIC APPENDECTOMY  07/25/2012   Procedure: APPENDECTOMY LAPAROSCOPIC;  Surgeon: Zenovia Jarred, MD;  [facility];  Service: General;  Laterality: N/A;   LAPAROSCOPIC TUBAL LIGATION Bilateral 08/04/2014   Procedure: BILATERAL LAPAROSCOPIC TUBAL LIGATION;  Surgeon: Woodroe Mode, MD;  Location: [facility];  Service: Gynecology;  Laterality: Bilateral;   LIPOMA EXCISION N/A  09/16/2015   Procedure: EXCISION LIPOMA LUMBAR REGION;  Surgeon: Donnie Mesa, MD;  Location: [facility];  Service: General;  Laterality: N/A;   TONSILLECTOMY     WISDOM TOOTH EXTRACTION      OB History     Gravida  3   Para  3   Term  2   Preterm  1   AB  0   Living  3      SAB  0   IAB  0   Ectopic  0   Multiple  0   Live Births  1        Obstetric Comments  C-Section Indication: Non-reassuring fetal tracing, growth delay & marked proteinuria & Pre-Eclampsia Eclamptic Seizure during C-section delivery          Home Medications    Prior to Admission medications   Medication Sig Start Date End Date Taking? Authorizing Provider  triamcinolone cream (KENALOG) 0.1 % Apply 1 Application topically 2 (two) times daily. 03/21/22  Yes , Michele Rockers, FNP  albuterol (VENTOLIN HFA) 108 (90 Base) MCG/ACT inhaler Inhale 2 puffs into the lungs every 4 (four) hours as needed for wheezing or shortness of breath. 07/21/21   Althea Charon, FNP  cetirizine (ZYRTEC) 10 MG tablet TAKE 2 TABLETS(20 MG) BY MOUTH TWICE DAILY 07/21/21   Althea Charon, FNP  EPINEPHrine (EPIPEN 2-PAK) 0.3 mg/0.3 mL IJ SOAJ injection Inject 0.3 mg into the muscle once as needed for anaphylaxis. 01/05/21   Garnet Sierras, DO  famotidine (PEPCID) 20 MG tablet Take 1 tablet (20 mg total) by mouth 2 (two) times daily. 07/21/21   Althea Charon, FNP  ibuprofen (ADVIL) 800 MG tablet Take 1 tablet (800 mg total) by mouth every 6 (six) hours as needed for moderate pain. 10/16/21   Orpah Greek, MD  Multiple Vitamin (MULTIVITAMIN ADULT PO) Take by mouth. Patient not taking: Reported on 07/21/2021    [provider]  ondansetron (ZOFRAN) 4 MG tablet Take 1 tablet (4 mg total) by mouth every 8 (eight) hours as needed for nausea or vomiting. 10/24/21   Lattie Haw, MD  polyethylene glycol powder (GLYCOLAX/MIRALAX) 17 GM/SCOOP powder Take 17 g by mouth daily. 08/30/21   Ganta, Anupa, DO   fluticasone (FLONASE) 50 MCG/ACT nasal spray Place 1 spray into both nostrils daily. Patient not taking: No sig reported 01/06/19 08/06/20  Garnet Sierras, DO  nortriptyline (PAMELOR) 25 MG capsule Take 1 capsule (25 mg total) by mouth at bedtime. 04/09/20 08/06/20  Cameron Sprang, MD    Family History Family History  Problem Relation Age of Onset   Diabetes Mother    Anesthesia problems Mother        hard to wake up post-op   Atrial fibrillation Mother    Congestive Heart Failure Mother    Diabetes Father    Heart disease Father    Diabetes Sister    Anesthesia problems Sister  hard to wake up post-op   Colon cancer Maternal Grandfather    Breast cancer Paternal Grandmother    Colon cancer Paternal Grandfather    Colon polyps Maternal Aunt    Eczema Neg Hx    Allergic rhinitis Neg Hx    Asthma Neg Hx     Social History Social History   Tobacco Use   Smoking status: Former    Packs/day: 0.00    Years: 14.00    Total pack years: 0.00    Types: Cigarettes    Quit date: 03/17/2017    Years since quitting: 5.0    Passive exposure: Never   Smokeless tobacco: Never  Vaping Use   Vaping Use: Some days  Substance Use Topics   Alcohol use: No   Drug use: No     Allergies   Adhesive [tape], Bee venom, and Amoxil [amoxicillin]   Review of Systems Review of Systems Per HPI  Physical Exam Triage Vital Signs ED Triage Vitals [03/21/22 1507]  Enc Vitals Group     BP 124/81     Pulse Rate (!) 105     Resp 18     Temp 97.8 F (36.6 C)     Temp src      SpO2 98 %     Weight      Height      Head Circumference      Peak Flow      Pain Score 6     Pain Loc      Pain Edu?      Excl. in [place]?    No data found.  Updated Vital Signs BP 124/81   Pulse (!) 105   Temp 97.8 F (36.6 C)   Resp 18   LMP 08/14/2016 (Exact Date)   SpO2 98%   Visual Acuity Right Eye Distance:   Left Eye Distance:   Bilateral Distance:    Right Eye Near:   Left Eye Near:     Bilateral Near:     Physical Exam Constitutional:      General: She is not in acute distress.    Appearance: Normal appearance. She is not toxic-appearing or diaphoretic.  HENT:     Head: Normocephalic and atraumatic.  Eyes:     Extraocular Movements: Extraocular movements intact.     Conjunctiva/sclera: Conjunctivae normal.  Cardiovascular:     Rate and Rhythm: Normal rate and regular rhythm.     Pulses: Normal pulses.     Heart sounds: Normal heart sounds.  Pulmonary:     Effort: Pulmonary effort is normal. No respiratory distress.     Breath sounds: Normal breath sounds.  Skin:    Comments: Approximately 1 cm in diameter maculopapular lesion present to mid lower abdomen.  No drainage noted.  Neurological:     General: No focal deficit present.     Mental Status: She is alert and oriented to person, place, and time. Mental status is at baseline.     Cranial Nerves: Cranial nerves 2-12 are intact.     Sensory: Sensation is intact.     Motor: Motor function is intact.     Coordination: Coordination is intact.     Gait: Gait is intact.  Psychiatric:        Mood and Affect: Mood normal.        Behavior: Behavior normal.        Thought Content: Thought content normal.        Judgment: Judgment normal.  UC Treatments / Results  Labs (all labs ordered are listed, but only abnormal results are displayed) Labs Reviewed - No data to display  EKG   Radiology No results found.  Procedures Procedures (including critical care time)  Medications Ordered in UC Medications  ketorolac (TORADOL) 30 MG/ML injection 30 mg (30 mg Intramuscular Given 03/21/22 1525)  ondansetron (ZOFRAN-ODT) disintegrating tablet 4 mg (4 mg Oral Given 03/21/22 1524)  dexamethasone (DECADRON) injection 10 mg (10 mg Intramuscular Given 03/21/22 1525)    Initial Impression / Assessment and Plan / UC Course  I have reviewed the triage vital signs and the nursing notes.  Pertinent labs & imaging  results that were available during my care of the patient were reviewed by me and considered in my medical decision making (see chart for details).     Patient has migraine with no associated neurological deficits.  Neuro exam is normal.  Patient has migraines and states these are typical for her migraines.  Will treat with migraine cocktail as patient has taken this previously and has tolerated well.  Kidney function appears normal so this should be safe.  Advised to go to the emergency department if symptoms persist or worsen in the next 24 to 48 hours.  Lesion to abdomen appears consistent with possible insect bite.  Will treat with triamcinolone cream.  Advised supportive care.  Patient to follow-up if symptoms persist or worsen.  Patient verbalized understanding and was agreeable with plan. Final Clinical Impressions(s) / UC Diagnoses   Final diagnoses:  Intractable migraine without aura and without status migrainosus  Insect bite of abdominal wall, initial encounter     Discharge Instructions      You were given a migraine cocktail in urgent care today.  Please go to the emergency department if symptoms persist or worsen in the next 24 hours.  Topical cream has been prescribed to apply to insect bite area.  Please follow-up if symptoms persist or worsen with this.    ED Prescriptions     Medication Sig Dispense Auth. Provider   triamcinolone cream (KENALOG) 0.1 % Apply 1 Application topically 2 (two) times daily. 30 g Teodora Medici, [place]      PDMP not reviewed this encounter.   Teodora Medici, [place] 03/21/22 1540

## 2022-03-21 NOTE — ED Triage Notes (Signed)
Pt is present today with c/o migraine and rash on the abdomen. Pt states that her migraine started yesterday and the bug bite she noticed on Sunday

## 2022-03-22 ENCOUNTER — Ambulatory Visit (INDEPENDENT_AMBULATORY_CARE_PROVIDER_SITE_OTHER): Payer: Medicaid Other | Admitting: Allergy

## 2022-03-22 ENCOUNTER — Encounter: Payer: Self-pay | Admitting: Allergy

## 2022-03-22 VITALS — BP 122/82 | HR 86 | Temp 99.2°F | Resp 18 | Ht 62.0 in | Wt 155.0 lb

## 2022-03-22 DIAGNOSIS — Z880 Allergy status to penicillin: Secondary | ICD-10-CM

## 2022-03-22 DIAGNOSIS — L508 Other urticaria: Secondary | ICD-10-CM

## 2022-03-22 DIAGNOSIS — J4520 Mild intermittent asthma, uncomplicated: Secondary | ICD-10-CM

## 2022-03-22 DIAGNOSIS — Z91030 Bee allergy status: Secondary | ICD-10-CM

## 2022-03-22 HISTORY — DX: Allergy status to penicillin: Z88.0

## 2022-03-22 MED ORDER — EPINEPHRINE 0.3 MG/0.3ML IJ SOAJ: 0.3000 mg | Freq: Once | INTRAMUSCULAR | 1 refills | Status: DC | PRN

## 2022-03-22 NOTE — Assessment & Plan Note (Signed)
Past history - Stung by an insect 6 months ago and had hives for 1 week. Needed steroid injection. Bloodwork slightly positive to honeybee. Interim history - no stings since the last visit.  Marland Kitchen Avoid stinging insects. . For mild symptoms you can take over the counter antihistamines such as Benadryl and monitor symptoms closely. If symptoms worsen or if you have severe symptoms including breathing issues, throat closure, significant swelling, whole body hives, severe diarrhea and vomiting, lightheadedness then inject epinephrine and seek immediate medical care afterwards.

## 2022-03-22 NOTE — Assessment & Plan Note (Signed)
Past history - Diagnosed with asthma over 10 years ago and currently has albuterol which helps. Interim history - no inhaler use. Asymptomatic.   Normal spirometry today.  May use albuterol rescue inhaler 2 puffs or nebulizer every 4 to 6 hours as needed for shortness of breath, chest tightness, coughing, and wheezing. May use albuterol rescue inhaler 2 puffs 5 to 15 minutes prior to strenuous physical activities. Monitor frequency of use.

## 2022-03-22 NOTE — Assessment & Plan Note (Signed)
Broke out in rash after taking Augmentin x 3 days for sinusitis. No prior issues with Augmentin.  Discussed with patient that sometimes the infection itself can cause rashes.  Continue to avoid penicillin type of antibiotics for now. Consider penicillin skin testing/drug challenge in the future.

## 2022-03-22 NOTE — Patient Instructions (Addendum)
Chronic urticaria Continue zyrtec (cetirizine) '10mg'$  2 tablets twice a day. If hives are not controlled or causes drowsiness let us know. Continue pepcid (famotidine) '20mg'$  twice a day.  Avoid the following potential triggers: alcohol, tight clothing, NSAIDs, hot showers and getting overheated. Consider Xolair injections. Get bloodwork We are ordering labs, so please allow 1-2 weeks for the results to come back. With the newly implemented Cures Act, the labs might be visible to you at the same time that they become visible to me. However, I will not address the results until all of the results are back, so please be patient.  In the meantime, continue recommendations in your patient instructions, including avoidance measures (if applicable), until you hear from me.   Mild intermittent asthma May use albuterol rescue inhaler 2 puffs every 4 to 6 hours as needed for shortness of breath, chest tightness, coughing, and wheezing. Monitor frequency of use. Asthma control goals:  Full participation in all desired activities (may need albuterol before activity) Albuterol use two times or less a week on average (not counting use with activity) Cough interfering with sleep two times or less a month Oral steroids no more than once a year No hospitalization  Bee sting  Avoid stinging insects. For mild symptoms you can take over the counter antihistamines such as Benadryl and monitor symptoms closely. If symptoms worsen or if you have severe symptoms including breathing issues, throat closure, significant swelling, whole body hives, severe diarrhea and vomiting, lightheadedness then inject epinephrine and seek immediate medical care afterwards.  Follow up in 2 months or sooner if needed.  Our [place] office is moving in September 2023 to a new location. New address: [address] [place], [place], [place] [address] (white building). [place] office: [phone] (same phone number).

## 2022-03-22 NOTE — Assessment & Plan Note (Signed)
Past history - Reaction to premade oatmeal cookies 1 year ago in the form of rash/hives. Patient used to tolerate these cookies with no issues. Then in March/April noticed difficulty breathing after eating eggs and on Easter had a hive outbreak after eating an egg. 2020 testing negative to foods. 2020 immunocap negative to foods as well. Slightly elevated tryptase, normal c kit.  Interim history - breaks out if misses antihistamines for more than 3 days. Had televisit with mast cell specialist (Dr. Leonides Schanz) at [facility].   Continue zyrtec (cetirizine) 43m 2 tablets twice a day.  If hives are not controlled or causes drowsiness let [place].  Continue pepcid (famotidine) 241mtwice a day.  . Avoid the following potential triggers: alcohol, tight clothing, NSAIDs, hot showers and getting overheated. . Consider Xolair injections. . Get bloodwork for CU index and pcr KIT D816V as recommended by Dr. WaLeonides Schanzo Patient unable to afford HAT testing. o Will hold off bone marrow biopsy for now - if patient interested will need referral to heme/onc.

## 2022-03-24 ENCOUNTER — Telehealth: Payer: Self-pay

## 2022-03-24 NOTE — Telephone Encounter (Signed)
Received Approval from [employer] for the following:  Proc Code: [id number]  Unit: 00001 Service Date: 03/22/22 Service End Date: 05/21/22 Proc Code Description: Kit Gene Analysis D816 [place]  Gave copy to [employer]; made a copy/labeled to be placed in Bulk Scanning.  Forwarding to provider as update.

## 2022-03-28 DIAGNOSIS — J4520 Mild intermittent asthma, uncomplicated: Secondary | ICD-10-CM | POA: Diagnosis not present

## 2022-03-28 DIAGNOSIS — Z880 Allergy status to penicillin: Secondary | ICD-10-CM | POA: Diagnosis not present

## 2022-03-28 DIAGNOSIS — Z91030 Bee allergy status: Secondary | ICD-10-CM | POA: Diagnosis not present

## 2022-03-28 DIAGNOSIS — L508 Other urticaria: Secondary | ICD-10-CM | POA: Diagnosis not present

## 2022-03-29 ENCOUNTER — Ambulatory Visit: Payer: Medicaid Other | Admitting: Allergy

## 2022-04-01 LAB — KIT (D816V) DIGITAL PCR: CKIT Result: NEGATIVE

## 2022-04-05 LAB — CP CHRONIC URTICARIA INDEX PANEL
Histamine Release: <16 % (ref <16)
TSH: 0.93 mIU/L (ref 0.40–4.50)
Thyroglobulin Ab: <1 IU/mL (ref <=1)
Thyroperoxidase Ab SerPl-aCnc: 1 IU/mL (ref <9)

## 2022-04-12 ENCOUNTER — Ambulatory Visit (INDEPENDENT_AMBULATORY_CARE_PROVIDER_SITE_OTHER): Payer: Medicaid Other

## 2022-04-12 ENCOUNTER — Telehealth: Payer: Self-pay | Admitting: Neurology

## 2022-04-12 DIAGNOSIS — G43009 Migraine without aura, not intractable, without status migrainosus: Secondary | ICD-10-CM

## 2022-04-12 MED ORDER — KETOROLAC TROMETHAMINE 60 MG/2ML IM SOLN
60.0000 mg | Freq: Once | INTRAMUSCULAR | Status: AC
  Administered 2022-04-12: 60 mg via INTRAMUSCULAR

## 2022-04-12 MED ORDER — DIPHENHYDRAMINE HCL 50 MG/ML IJ SOLN: 25.0000 mg | Freq: Once | INTRAMUSCULAR | Status: DC

## 2022-04-12 MED ORDER — DIPHENHYDRAMINE HCL 50 MG/ML IJ SOLN
25.0000 mg | Freq: Once | INTRAMUSCULAR | Status: AC
  Administered 2022-04-12: 25 mg via INTRAMUSCULAR

## 2022-04-12 MED ORDER — METOCLOPRAMIDE HCL 5 MG/ML IJ SOLN
10.0000 mg | Freq: Once | INTRAMUSCULAR | Status: AC
  Administered 2022-04-12: 10 mg via INTRAMUSCULAR

## 2022-04-12 NOTE — Telephone Encounter (Signed)
[place] for migraine cocktail, thanks

## 2022-04-12 NOTE — Telephone Encounter (Signed)
Pt called an informed that its ok for her to get a headache cocktail she needs a driver to bring her and come up to the office with her, pt verbalized understanding,

## 2022-04-12 NOTE — Progress Notes (Signed)
Per Dr.Aqunio patient okay to get a headache cocktail

## 2022-04-12 NOTE — Telephone Encounter (Signed)
Patient called and said her migraines have returned and she's had one for the last three days.  She'd like to come in for a HA cocktail.

## 2022-04-19 ENCOUNTER — Other Ambulatory Visit: Payer: Self-pay | Admitting: Family

## 2022-04-25 ENCOUNTER — Ambulatory Visit: Payer: Medicaid Other | Admitting: Neurology

## 2022-04-25 ENCOUNTER — Encounter: Payer: Self-pay | Admitting: Neurology

## 2022-04-25 VITALS — BP 118/71 | HR 98 | Ht 62.0 in | Wt 154.8 lb

## 2022-04-25 DIAGNOSIS — G43009 Migraine without aura, not intractable, without status migrainosus: Secondary | ICD-10-CM | POA: Diagnosis not present

## 2022-04-25 MED ORDER — PROPRANOLOL HCL 20 MG PO TABS: ORAL_TABLET | ORAL | 6 refills | Status: DC

## 2022-04-25 NOTE — Progress Notes (Signed)
NEUROLOGY FOLLOW UP OFFICE NOTE  Michelle [name] [id number] 1991-01-03  HISTORY OF PRESENT ILLNESS: I had the pleasure of seeing Michelle [name] in follow-up in the neurology clinic on 04/25/2022.  The patient was last seen on 6 months ago for migraines. She had a good response to nortriptyline and was able to wean off medication, however today presents for migraine recurrence. She is concerned that although nortriptyline had helped, she was having tachycardia and worries that since her mother is having atrial fibrillation currently, this would be an issue. She has headaches lasting more than 3 days every month. She endorses a lot of stress, her son was in the ICU, then her nephew passed away, currently her mother is in the hospital. She gets less than 6 hours of interrupted sleep. Headaches are a little different, in the past they were localized to one spot and lights/sounds made them worse, currently headaches are "wherever" and lights are brighter. She has nausea. She does not take any PO medication for the headaches, sometimes she takes Tylenol for her hip pain. She has been to our office a few times for a migraine cocktail, most recently on 04/12/22. She has a history of medullary sponge kidneys with nephrolithiasis. She has had a hysterectomy. 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 She is awaiting Cardiology evaluation for POTs evaluation, she gets dizzy when standing. She denies any vision changes, focal numbness/tingling/weakness, no falls.   History on Initial Assessment 03/06/2016: This is a 31yo RH woman with a history of bipolar disorder, PTSD, hypertension, who presented with new onset headaches since June 2016. 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 They were so bad that she was shaking on  the floor and felt like she would pass out. She went to the ER on 01/06/16 and reports having daily headaches that wax and wane in intensity since then. She would wake up with a headache then towards the evening they become more severe 10/10 with throbbing pain and nausea. She occasionally sees black dots in her vision. No positional component. She stopped Depo-Provera last week. No photo/phonophobia, tinnitus, focal numbness/tingling/weakness, dizziness, diplopia, dysarthria, dysphagia, bowel/bladder dysfunction. She has had neck pains as well. She has chronic back pain.    She has been dealing with significant stomach cramps and will be seeing a specialist regarding concern for endometriosis. She has been taking Tramadol and Percocet daily for the cramps, and has prn Fioricet for the headaches, taking 2-3 daily for the past 2 months. She gets 8-9 hours of sleep but still feels exhausted (for the past couple of years). She denies snoring, she has daytime drowsiness. She was given Reglan as well in the ER and takes this daily. There is a history of one seizure during delivery of her third child in 2013. No family history of migraines.   Prior rescue: sumatriptan  Prior preventative   PAST MEDICAL HISTORY: Past Medical History:  Diagnosis Date   Anxiety    Asthma    Bipolar disorder ([place])    no current med.   Depression    no current med.   Dermatitis 01/16/2019   Family history of adverse reaction to anesthesia    states mother and sister are hard to wake up post-op   Gestational diabetes    History of seizure 06/20/2012  x 1 - during delivery of child(eclampsia)   Hypermobile Ehlers-Danlos syndrome    Hypertension    Idiopathic urticaria    Lipoma of lower back 08/2015   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/2019   Migraine    Nephrolithiasis    PTSD (post-traumatic stress disorder)    PTSD (post-traumatic stress disorder)    Pyelonephritis 01/16/2019   Seizures ([place])     Tachycardia    TMJ (dislocation of temporomandibular joint)    Vertigo     MEDICATIONS: Current Outpatient Medications on File Prior to Visit  Medication Sig Dispense Refill   albuterol (VENTOLIN HFA) 108 (90 Base) MCG/ACT inhaler Inhale 2 puffs into the lungs every 4 (four) hours as needed for wheezing or shortness of breath. 18 g 1   cetirizine (ZYRTEC) 10 MG tablet TAKE 2 TABLETS(20 MG) BY MOUTH TWICE DAILY 120 tablet 5   EPINEPHrine (EPIPEN 2-PAK) 0.3 mg/0.3 mL IJ SOAJ injection Inject 0.3 mg into the muscle once as needed for anaphylaxis. 2 each 1   famotidine (PEPCID) 20 MG tablet TAKE 1 TABLET(20 MG) BY MOUTH TWICE DAILY 60 tablet 5   ibuprofen (ADVIL) 800 MG tablet Take 1 tablet (800 mg total) by mouth every 6 (six) hours as needed for moderate pain. 20 tablet 0   [DISCONTINUED] fluticasone (FLONASE) 50 MCG/ACT nasal spray Place 1 spray into both nostrils daily. (Patient not taking: No sig reported) 15.8 mL 5   [DISCONTINUED] nortriptyline (PAMELOR) 25 MG capsule Take 1 capsule (25 mg total) by mouth at bedtime. 30 capsule 6   No current facility-administered medications on file prior to visit.    ALLERGIES: Allergies  Allergen Reactions   Adhesive [Tape] Hives and Other (See Comments)    EKG or heart monitor pads- caused raised, red areas (WELTS) on the skin; "sensitive" pads fell off   Bee Venom Hives and Other (See Comments)    Welts, also   Amoxil [Amoxicillin] Rash    FAMILY HISTORY: Family History  Problem Relation Age of Onset   Diabetes Mother    Anesthesia problems Mother        hard to wake up post-op   Atrial fibrillation Mother    Congestive Heart Failure Mother    Diabetes Father    Heart disease Father    Diabetes Sister    Anesthesia problems Sister        hard to wake up post-op   Colon cancer Maternal Grandfather    Breast cancer Paternal Grandmother    Colon cancer Paternal Grandfather    Colon polyps Maternal Aunt    Eczema Neg Hx    Allergic  rhinitis Neg Hx    Asthma Neg Hx     SOCIAL HISTORY: Social History   Socioeconomic History   Marital status: Single    Spouse name: Not on file   Number of children: 2   Years of education: Not on file   Highest education level: Not on file  Occupational History   Not on file  Tobacco Use   Smoking status: Former    Packs/day: 0.00    Years: 14.00    Total pack years: 0.00    Types: Cigarettes    Quit date: 03/17/2017    Years since quitting: 5.1    Passive exposure: Never   Smokeless tobacco: Never  Vaping Use   Vaping Use: Some days  Substance and Sexual Activity   Alcohol use: No   Drug use: No   Sexual activity:  Yes    Birth control/protection: Surgical  Other Topics Concern   Not on file  Social History Narrative   Lives with boyfriend and 3 children in a one story home.  Does not work.  Education: 9th grade. Right handed    Social Determinants of Health   Financial Resource Strain: Not on file  Food Insecurity: Not on file  Transportation Needs: Not on file  Physical Activity: Not on file  Stress: Not on file  Social Connections: Not on file  Intimate Partner Violence: Not on file     PHYSICAL EXAM: Vitals:   04/25/22 0912  BP: 118/71  Pulse: 98  SpO2: 98%   General: No acute distress Head:  Normocephalic/atraumatic Skin/Extremities: No rash, no edema Neurological Exam: alert and awake. No aphasia or dysarthria. Fund of knowledge is appropriate.  Attention and concentration are normal.   Cranial nerves: Pupils equal, round. Extraocular movements intact with no nystagmus. Visual fields full.  No facial asymmetry.  Motor: Bulk and tone normal, muscle strength 5/5 throughout with no pronator drift.   Finger to nose testing intact.  Gait narrow-based and steady, able to tandem walk adequately.  Romberg negative.   IMPRESSION: This is a 31 yo RH woman with a history of bipolar disorder, PTSD, hypertension, s/p hysterectomy, EDS, who presented in 2017 for  new onset daily headaches with migranous features. MRI brain normal. She had a good response to nortriptyline and was able to wean off medication, however presents for migraine recurrence. She is concerned nortriptyline caused tachycardia and is interested in a different preventative medication. Avoid Topiramate/Zonisamide due to nephrolithiasis. She is agreeable to start Propranolol '20mg'$  qhs x 1 week, then increase to '20mg'$  BID. Side effects discussed. Follow-up in 4 months, call for any changes.   Thank you for allowing me to participate in her care.  Please do not hesitate to call for any questions or concerns.    Ellouise Newer, M.D.  CC: Dr. Owens Shark

## 2022-04-25 NOTE — Patient Instructions (Signed)
Hopefully the headaches start quieting down. Start Propranolol '20mg'$ : take 1 tablet every night for 1 week, then increase to 1 tablet twice a day. Keep a calendar of your headaches. Follow-up in 4 months, call for any changes.

## 2022-05-17 ENCOUNTER — Ambulatory Visit: Payer: Medicaid Other

## 2022-05-21 NOTE — Progress Notes (Deleted)
Follow Up Note  RE: Michelle [name] MRN: [id number] DOB: 1990/09/17 Date of Office Visit: 05/22/2022  Referring provider: Orvis Brill, DO Primary care provider: Orvis Brill, DO  Chief Complaint: No chief complaint on file.  History of Present Illness: I had the pleasure of seeing Michelle [name] for a follow up visit at the [facility] on 05/21/2022. She is a 31 y.o. female, who is being followed for chronic urticaria, asthma,bee sting sensitivity and penicillin allergy. Her previous allergy office visit was on 03/22/2022 with Dr. Maudie Mercury. Today is a regular follow up visit.    Reviewed bloodwork - no mast cell issues based on these results.    Chronic urticaria Past history - Reaction to premade oatmeal cookies 1 year ago in the form of rash/hives. Patient used to tolerate these cookies with no issues. Then in March/April noticed difficulty breathing after eating eggs and on Easter had a hive outbreak after eating an egg. 2020 testing negative to foods. 2020 immunocap negative to foods as well. Slightly elevated tryptase, normal c kit.  Interim history - breaks out if misses antihistamines for more than 3 days. Had televisit with mast cell specialist (Dr. Leonides Schanz) at [facility].  Continue zyrtec (cetirizine) 38m 2 tablets twice a day. If hives are not controlled or causes drowsiness let [place]. Continue pepcid (famotidine) 279mtwice a day.  Avoid the following potential triggers: alcohol, tight clothing, NSAIDs, hot showers and getting overheated. Consider Xolair injections. Get bloodwork for CU index and pcr KIT D816V as recommended by Dr. WaLeonides SchanzPatient unable to afford HAT testing. Will hold off bone marrow biopsy for now - if patient interested will need referral to heme/onc.    Mild intermittent asthma without complication Past history - Diagnosed with asthma over 10 years ago and currently has albuterol which helps. Interim history - no inhaler use. Asymptomatic.   Normal spirometry today. May use albuterol rescue inhaler 2 puffs or nebulizer every 4 to 6 hours as needed for shortness of breath, chest tightness, coughing, and wheezing. May use albuterol rescue inhaler 2 puffs 5 to 15 minutes prior to strenuous physical activities. Monitor frequency of use.    Bee sting allergy Past history - Stung by an insect 6 months ago and had hives for 1 week. Needed steroid injection. Bloodwork slightly positive to honeybee. Interim history - no stings since the last visit.  Avoid stinging insects. For mild symptoms you can take over the counter antihistamines such as Benadryl and monitor symptoms closely. If symptoms worsen or if you have severe symptoms including breathing issues, throat closure, significant swelling, whole body hives, severe diarrhea and vomiting, lightheadedness then inject epinephrine and seek immediate medical care afterwards.   Penicillin allergy Broke out in rash after taking Augmentin x 3 days for sinusitis. No prior issues with Augmentin. Discussed with patient that sometimes the infection itself can cause rashes. Continue to avoid penicillin type of antibiotics for now. Consider penicillin skin testing/drug challenge in the future.  11/22/2021 [facility] allergy note: "Assessment/Plan   Michelle Lasalas a [age].o. female with history of EDS, bilateral medullary sponge kidney disease, mild intermittent asthma, chronic rhinitis who is here via telemedicine video visit for evaluation of chronic spontaneous urticaria.   Chronic Spontaneous urticaria  Patient had has hives on weekly basic without consistent trigger Previous skin allergy testing was negative and serum IgE was overall negative but honeybee was 0.19 Discussed that this history is most consistent with chronic idiopathic (spontaneous)  urticaria CIU is a quasi-autoimmune disease, and as such is not caused by exposure to allergens However, some agents may increase the likelihood or  severity of hives in certain patients, such as stress, NSAIDs, alcohol, etc. Given the concern for other mast cell disorders (see below), checking for urticaria-associated autoantibodies may be helpful Recommend checking Chronic Urticaria Index, available from [facility] (test ID: [id number]), [facility] (test ID: [place]), or [facility] ([id number]) Note that a negative test does NOT rule-out CIU, but a positive test confirms it Recommend continuing current antihistamine regimen for now Continue Zyrtec 70m PO twice daily Continue Pepcid 286mPO twice daily Because she continues to experience breakthrough on antihistamines, discussed potentially starting Xolair, and she would like time to consider starting the medication with her local allergist Discussed low risk for cancer in many years of clinical followup after FDA approval Discussed side effects of hair loss and allergic reactions that occurs with 1st to 2nd dose.   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 Discussed what is known about the pathophysiology of HaT, that it is more so a biochemical trait, and patients don't always have symptoms These duplications are carried on a single chromosome and can be inherited from parent to child In some cases, both parents can carry the duplication, so that a child could have multiple copies Nonspecific symptoms including GI, joint, cutaneous and cardiovascular manifestation may be linked to the over-expression of alpha-tryptase but are NOT always be caused by HAT There is increased association between patients with HAT and POTS +/- EDS along with other medical conditions  Discussed with patient that given the high tryptase level, it is reccommended to obtain genetic  testing for TPSAB1 copy number However, testing is currently only available out-of-pocket, and she is unable to perform now due to financial hurdles   Rule out systemic mastocytosis Elevated baseline tryptase can be seen in systemic mastocytosis, though lower suspicion overall for systemic mastocytosis at this time based on her history and symptoms Records indicate that KIT sequencing was performed on peripheral blood, but sequencing of the gene (typically done for leukemia workup) is not sensitive enough to pick up rare SM cells Recommend checking allele-specific digital droplet PCR for KIT D816V, available through [facility] (test ID: [id number] [facility] (test ID: [facility]), or [facility] (test ID: : [id number] that this test may miss some cases of SM in which the mastocytosis cells are confined to the bone marrow Discussed that bone marrow biopsy may not be needed given the likelihood of HaT (see above), but she is still very interested in this to help rule out SM Bone marrow should be stained for CD117 and tryptase to help identify the number and morphology of mast cells Marrow sample should also be stained for CD2 and CD25 to look for aberrant expression of these markes Please also specify that allele-specific digital droplet PCR for KIT D816V is performed on the marrow sample (see above for test codes) Mast cell-specific stains can be performed after-the-fact on fixed tissue, but D816V PCR must be sent on fresh sample at time of collection  Mild intermittent asthma without complication:  Well controlled, no recent exacerbations or hospitalizations Continue albuterol MDI, 2 puff inhaled every 4-6 hours as needed for wheezing  Chronic Rhinitis Currently well controlled, asymptomatic for 2 years  Continue zyrtec, as above"  Assessment and Plan: Michelle [name] is a 31 y.o. female with: No problem-specific Assessment & Plan notes found for this encounter.  No follow-ups on file.  No orders of the defined  types were placed in this encounter.  Lab Orders  No laboratory test(s) ordered today     Diagnostics: Spirometry:  Tracings reviewed. Her effort: Good reproducible efforts. FVC: 3.70L FEV1: 3.13L, 106% predicted FEV1/FVC ratio: 85% Interpretation: Spirometry consistent with normal pattern.  Please see scanned spirometry results for details.   Medication List:  Current Outpatient Medications  Medication Sig Dispense Refill   albuterol (VENTOLIN HFA) 108 (90 Base) MCG/ACT inhaler Inhale 2 puffs into the lungs every 4 (four) hours as needed for wheezing or shortness of breath. 18 g 1   cetirizine (ZYRTEC) 10 MG tablet TAKE 2 TABLETS(20 MG) BY MOUTH TWICE DAILY 120 tablet 5   EPINEPHrine (EPIPEN 2-PAK) 0.3 mg/0.3 mL IJ SOAJ injection Inject 0.3 mg into the muscle once as needed for anaphylaxis. 2 each 1   famotidine (PEPCID) 20 MG tablet TAKE 1 TABLET(20 MG) BY MOUTH TWICE DAILY 60 tablet 5   ibuprofen (ADVIL) 800 MG tablet Take 1 tablet (800 mg total) by mouth every 6 (six) hours as needed for moderate pain. 20 tablet 0   propranolol (INDERAL) 20 MG tablet Take 1 tablet every night for 1 week, then increase to 1 tablet twice a day 60 tablet 6   No current facility-administered medications for this visit.   Allergies: Allergies  Allergen Reactions   Adhesive [Tape] Hives and Other (See Comments)    EKG or heart monitor pads- caused raised, red areas (WELTS) on the skin; "sensitive" pads fell off   Bee Venom Hives and Other (See Comments)    Welts, also   Amoxil [Amoxicillin] Rash   I reviewed her past medical history, social history, family history, and environmental history and no significant changes have been reported from her previous visit.  Review of Systems  Constitutional:  Negative for appetite change, chills, fever and unexpected weight change.  HENT:  Negative for congestion, rhinorrhea and sneezing.   Eyes:  Negative for itching.  Respiratory:  Negative for cough,  chest tightness, shortness of breath and wheezing.   Cardiovascular:  Negative for chest pain.  Gastrointestinal:  Negative for abdominal pain.  Genitourinary:  Negative for difficulty urinating.  Skin:  Negative for rash.  Allergic/Immunologic: Negative for environmental allergies.  Neurological:  Positive for headaches.    Objective: LMP 08/14/2016 (Exact Date)  There is no height or weight on file to calculate BMI. Physical Exam Vitals and nursing note reviewed.  Constitutional:      Appearance: She is well-developed.  HENT:     Head: Normocephalic and atraumatic.     Right Ear: Tympanic membrane and external ear normal.     Left Ear: Tympanic membrane and external ear normal.     Nose: Nose normal.     Mouth/Throat:     Mouth: Mucous membranes are moist.  Eyes:     Conjunctiva/sclera: Conjunctivae normal.  Cardiovascular:     Rate and Rhythm: Normal rate and regular rhythm.     Heart sounds: Normal heart sounds. No murmur heard.    No friction rub. No gallop.  Pulmonary:     Effort: Pulmonary effort is normal.     Breath sounds: Normal breath sounds. No wheezing or rales.  Musculoskeletal:     Cervical back: Neck supple.  Skin:    General:  Skin is warm.     Findings: No rash.  Neurological:     Mental Status: She is alert and oriented to person, place, and time.  Psychiatric:        Behavior: Behavior normal.    Previous notes and tests were reviewed. The plan was reviewed with the patient/family, and all questions/concerned were addressed.  It was my pleasure to see Michelle [name] today and participate in her care. Please feel free to contact me with any questions or concerns.  Sincerely,  Rexene Alberts, DO Allergy & Immunology  [facility] office: [place] office: [phone]

## 2022-05-22 ENCOUNTER — Ambulatory Visit: Payer: Medicaid Other | Admitting: Allergy

## 2022-05-22 DIAGNOSIS — L508 Other urticaria: Secondary | ICD-10-CM

## 2022-05-22 DIAGNOSIS — Z880 Allergy status to penicillin: Secondary | ICD-10-CM

## 2022-05-22 DIAGNOSIS — J4520 Mild intermittent asthma, uncomplicated: Secondary | ICD-10-CM

## 2022-05-22 DIAGNOSIS — Z91030 Bee allergy status: Secondary | ICD-10-CM

## 2022-06-02 ENCOUNTER — Emergency Department (HOSPITAL_BASED_OUTPATIENT_CLINIC_OR_DEPARTMENT_OTHER)
Admission: EM | Admit: 2022-06-02 | Discharge: 2022-06-02 | Disposition: A | Discharge status: Home / Self Care | Payer: Medicaid Other | Attending: Emergency Medicine | Admitting: Emergency Medicine

## 2022-06-02 ENCOUNTER — Other Ambulatory Visit: Payer: Self-pay

## 2022-06-02 DIAGNOSIS — Z79899 Other long term (current) drug therapy: Secondary | ICD-10-CM | POA: Diagnosis not present

## 2022-06-02 DIAGNOSIS — G43909 Migraine, unspecified, not intractable, without status migrainosus: Secondary | ICD-10-CM | POA: Diagnosis not present

## 2022-06-02 DIAGNOSIS — I10 Essential (primary) hypertension: Secondary | ICD-10-CM | POA: Insufficient documentation

## 2022-06-02 DIAGNOSIS — R1030 Lower abdominal pain, unspecified: Secondary | ICD-10-CM | POA: Diagnosis not present

## 2022-06-02 LAB — CBC
HCT: 42.1 % (ref 36.0–46.0)
Hemoglobin: 14.3 g/dL (ref 12.0–15.0)
MCH: 31.2 pg (ref 26.0–34.0)
MCHC: 34 g/dL (ref 30.0–36.0)
MCV: 91.7 fL (ref 80.0–100.0)
Platelets: 193 10*3/uL (ref 150–400)
RBC: 4.59 MIL/uL (ref 3.87–5.11)
RDW: 11.8 % (ref 11.5–15.5)
WBC: 10.3 10*3/uL (ref 4.0–10.5)
nRBC: 0 % (ref 0.0–0.2)

## 2022-06-02 LAB — COMPREHENSIVE METABOLIC PANEL WITH GFR
ALT: 12 U/L (ref 0–44)
AST: 14 U/L — ABNORMAL LOW (ref 15–41)
Albumin: 4.5 g/dL (ref 3.5–5.0)
Alkaline Phosphatase: 43 U/L (ref 38–126)
Anion gap: 11 (ref 5–15)
BUN: 11 mg/dL (ref 6–20)
CO2: 21 mmol/L — ABNORMAL LOW (ref 22–32)
Calcium: 8.7 mg/dL — ABNORMAL LOW (ref 8.9–10.3)
Chloride: 104 mmol/L (ref 98–111)
Creatinine, Ser: 0.62 mg/dL (ref 0.44–1.00)
GFR, Estimated: >60 mL/min
Glucose, Bld: 98 mg/dL (ref 70–99)
Potassium: 4.1 mmol/L (ref 3.5–5.1)
Sodium: 136 mmol/L (ref 135–145)
Total Bilirubin: 0.6 mg/dL (ref 0.3–1.2)
Total Protein: 7.3 g/dL (ref 6.5–8.1)

## 2022-06-02 LAB — LIPASE, BLOOD: Lipase: 13 U/L (ref 11–51)

## 2022-06-02 LAB — URINALYSIS, ROUTINE W REFLEX MICROSCOPIC
Bilirubin Urine: NEGATIVE
Glucose, UA: NEGATIVE mg/dL
Hgb urine dipstick: NEGATIVE
Ketones, ur: 15 mg/dL — AB
Leukocytes,Ua: NEGATIVE
Nitrite: NEGATIVE
Protein, ur: NEGATIVE mg/dL
Specific Gravity, Urine: <1.005 — ABNORMAL LOW (ref 1.005–1.030)
pH: 6 (ref 5.0–8.0)

## 2022-06-02 MED ORDER — METOCLOPRAMIDE HCL 10 MG PO TABS
10.0000 mg | ORAL_TABLET | Freq: Once | ORAL | Status: AC
Start: 2022-06-02 — End: 2022-06-02
  Administered 2022-06-02: 10 mg via ORAL
  Filled 2022-06-02: qty 1

## 2022-06-02 MED ORDER — METOCLOPRAMIDE HCL 10 MG PO TABS: 10.0000 mg | ORAL_TABLET | Freq: Four times a day (QID) | ORAL | 0 refills | Status: DC

## 2022-06-02 MED ORDER — ACETAMINOPHEN 500 MG PO TABS
1000.0000 mg | ORAL_TABLET | Freq: Once | ORAL | Status: AC
Start: 2022-06-02 — End: 2022-06-02
  Administered 2022-06-02: 1000 mg via ORAL
  Filled 2022-06-02: qty 2

## 2022-06-02 NOTE — Discharge Instructions (Addendum)
Please take tylenol/ibuprofen for pain. Take Reglan as needed for nausea. I recommend close follow-up with PCP for reevaluation.  Please do not hesitate to return to emergency department if worrisome signs symptoms we discussed become apparent.

## 2022-06-02 NOTE — ED Provider Notes (Signed)
[facility] EMERGENCY DEPT Provider Note   CSN: [id number] Arrival date & time: 06/02/22  1252     History {Add pertinent medical, surgical, social history, OB history to HPI:1} Chief Complaint  Patient presents with   Nausea    Michelle [name] is a 31 y.o. female with a past medical history of migraine, hypertension, status post hysterectomy, appendectomy presenting to the emergency room for evaluation of lower abdominal pain.  Patient states she woke up this morning around 2 AM with lower abdominal pain.  States the pain is located on the suprapubic area, sharp, intermittent, nonradiating.  She has not tried any medications for pain.  Denies any urinary symptoms, vaginal discharge, blood in stool or urine.  She also reports migraine headache, nausea without vomiting upon waking up.  She is not having pain or nausea at the moment.  History of hysterectomy without removal of the ovaries.  History of migraine currently put on propanolol however the patient denies taking the medications.  Denies chest pain, shortness of breath, constipation, diarrhea, fever, rash.  HPI     Home Medications Prior to Admission medications   Medication Sig Start Date End Date Taking? Authorizing Provider  albuterol (VENTOLIN HFA) 108 (90 Base) MCG/ACT inhaler Inhale 2 puffs into the lungs every 4 (four) hours as needed for wheezing or shortness of breath. 07/21/21   Althea Charon, FNP  cetirizine (ZYRTEC) 10 MG tablet TAKE 2 TABLETS(20 MG) BY MOUTH TWICE DAILY 04/20/22   Garnet Sierras, DO  EPINEPHrine (EPIPEN 2-PAK) 0.3 mg/0.3 mL IJ SOAJ injection Inject 0.3 mg into the muscle once as needed for anaphylaxis. 03/22/22   Garnet Sierras, DO  famotidine (PEPCID) 20 MG tablet TAKE 1 TABLET(20 MG) BY MOUTH TWICE DAILY 04/20/22   Garnet Sierras, DO  ibuprofen (ADVIL) 800 MG tablet Take 1 tablet (800 mg total) by mouth every 6 (six) hours as needed for moderate pain. 10/16/21   Orpah Greek, MD  propranolol  (INDERAL) 20 MG tablet Take 1 tablet every night for 1 week, then increase to 1 tablet twice a day 04/25/22   Cameron Sprang, MD  fluticasone [facility]) 50 MCG/ACT nasal spray Place 1 spray into both nostrils daily. Patient not taking: No sig reported 01/06/19 08/06/20  Garnet Sierras, DO  nortriptyline (PAMELOR) 25 MG capsule Take 1 capsule (25 mg total) by mouth at bedtime. 04/09/20 08/06/20  Cameron Sprang, MD      Allergies    Adhesive [tape], Bee venom, and Amoxil [amoxicillin]    Review of Systems   Review of Systems  Gastrointestinal:  Positive for abdominal pain.    Physical Exam Updated Vital Signs BP 110/81 (BP Location: Right Arm)   Pulse (!) 130   Temp 98.7 F (37.1 C) (Oral)   Resp 18   LMP 08/14/2016 (Exact Date)   SpO2 98%  Physical Exam Vitals and nursing note reviewed.  Constitutional:      Appearance: Normal appearance.  HENT:     Head: Normocephalic and atraumatic.     Mouth/Throat:     Mouth: Mucous membranes are moist.  Eyes:     General: No scleral icterus. Cardiovascular:     Rate and Rhythm: Normal rate and regular rhythm.     Pulses: Normal pulses.     Heart sounds: Normal heart sounds.  Pulmonary:     Effort: Pulmonary effort is normal.     Breath sounds: Normal breath sounds.  Abdominal:  General: Abdomen is flat.     Palpations: Abdomen is soft.     Tenderness: There is abdominal tenderness (suprapubic).  Musculoskeletal:        General: No deformity.  Skin:    General: Skin is warm.     Findings: No rash.  Neurological:     General: No focal deficit present.     Mental Status: She is alert.  Psychiatric:        Mood and Affect: Mood normal.     ED Results / Procedures / Treatments   Labs (all labs ordered are listed, but only abnormal results are displayed) Labs Reviewed  LIPASE, BLOOD  COMPREHENSIVE METABOLIC PANEL  CBC  URINALYSIS, ROUTINE W REFLEX MICROSCOPIC    EKG None  Radiology No results  found.  Procedures Procedures  {Document cardiac monitor, telemetry assessment procedure when appropriate:1}  Medications Ordered in ED Medications - No data to display  ED Course/ Medical Decision Making/ A&P Clinical Course as of 06/02/22 1455  Fri Jun 02, 2022  1336 She is complaining of a cute onset of lower abdominal pain that started earlier this morning.  She has had some nausea and dry heaves.  Currently she denies any pain.  She said her bowel movement yesterday was a little hard but she does not think she is constipated.  No urinary symptoms.  Abdominal exam is benign at this time.  Getting some screening labs and urinalysis.  Possible imaging [MB]  1419 Pregnancy, urine [KL]    Clinical Course User Index [KL] Rex Kras, PA [MB] Hayden Rasmussen, MD                           Medical Decision Making Amount and/or Complexity of Data Reviewed Labs: ordered.   This patient presents to the ED for suprapubic abdominal pain, this involves an extensive number of treatment options, and is a complaint that carries with a high risk of complications and morbidity.  The differential diagnosis includes UTI, ovarian torsion, ovarian cyst, diverticulitis, small bowel obstruction, infectious etiology.  This is not an exhaustive list.  Comorbidities that complicate the patient evaluation See HPI  Social determinants of health NA  Additional history obtained: Additional history obtained from EMR. External records from outside source obtained and review including prior labs  Cardiac monitoring/EKG: The patient was maintained on a cardiac monitor.  I personally reviewed and interpreted the cardiac monitor which showed an underlying rhythm of: Sinus rhythm.  Lab tests: I ordered and personally interpreted labs.  The pertinent results include: WBC unremarkable. Hbg unremarkable. Platelets unremarkable. No electrolyte abnormalities noted. BUN, creatinine unremarkable. LFT  unremarkable. UA significant for no acute abnormality.   Imaging studies:  Problem list/ ED course/ Critical interventions/ Medical management: HPI: See above Vital signs significant for heart rate 130 otherwise within normal range and stable throughout visit. Laboratory/imaging studies significant for: See above. On physical examination, patient is afebrile and appears in no acute distress.  She does have tenderness to palpation to suprapubic area.  No TTP to right lower quadrant or left lower quadrant.  Rule out appendicitis as patient had appendectomy.  UA unremarkable.  UTI unlikely.  Kidney stone unlikely as patient has no urinary symptoms, no flank pain, no RBC in urine.  Patient's clinical presentations and laboratory/imaging studies are most concerned for musculoskeletal pain. Advised patient to take Tylenol/ibuprofen for pain.  Follow-up with primary care provider for further evaluation and management.  Return  to the ED if symptoms persist or patient develop fever.  I sent an Rx of Reglan as patient symptoms of nausea did not resolve with Zofran at home. Tylenol and Reglan ordered. Reevaluation of the patient after these medications showed that the patient improved.   I have reviewed the patient home medicines and have made adjustments as needed.  Consultations obtained:  Disposition Continued outpatient therapy. Follow-up with PCP  recommended for reevaluation of symptoms. Treatment plan discussed with patient.  Pt acknowledged understanding was agreeable to the plan. Worrisome signs and symptoms were discussed with patient, and patient acknowledged understanding to return to the ED if they noticed these signs and symptoms. Patient was stable upon discharge.   This chart was dictated using voice recognition software.  Despite best efforts to proofread,  errors can occur which can change the documentation meaning.    {Document critical care time when appropriate:1} {Document review of  labs and clinical decision tools ie heart score, Chads2Vasc2 etc:1}  {Document your independent review of radiology images, and any outside records:1} {Document your discussion with family members, caretakers, and with consultants:1} {Document social determinants of health affecting pt's care:1} {Document your decision making why or why not admission, treatments were needed:1} Final Clinical Impression(s) / ED Diagnoses Final diagnoses:  Lower abdominal pain    Rx / DC Orders ED Discharge Orders     None

## 2022-06-02 NOTE — ED Triage Notes (Addendum)
Pt awoke this morning with nausea and dry heaving since 2am, along with intermittent suprapubic/lower abdominal pain.  Pt took zofran ODT without relief. Pt also reports migraine headache and sensitivity to light.

## 2022-06-04 ENCOUNTER — Encounter (HOSPITAL_BASED_OUTPATIENT_CLINIC_OR_DEPARTMENT_OTHER): Payer: Self-pay

## 2022-06-04 ENCOUNTER — Other Ambulatory Visit: Payer: Self-pay

## 2022-06-04 DIAGNOSIS — R42 Dizziness and giddiness: Secondary | ICD-10-CM | POA: Insufficient documentation

## 2022-06-04 DIAGNOSIS — R251 Tremor, unspecified: Secondary | ICD-10-CM | POA: Insufficient documentation

## 2022-06-04 DIAGNOSIS — R109 Unspecified abdominal pain: Secondary | ICD-10-CM | POA: Insufficient documentation

## 2022-06-04 DIAGNOSIS — F419 Anxiety disorder, unspecified: Secondary | ICD-10-CM | POA: Diagnosis not present

## 2022-06-04 LAB — CBC
HCT: 37.8 % (ref 36.0–46.0)
Hemoglobin: 13 g/dL (ref 12.0–15.0)
MCH: 31 pg (ref 26.0–34.0)
MCHC: 34.4 g/dL (ref 30.0–36.0)
MCV: 90.2 fL (ref 80.0–100.0)
Platelets: 249 10*3/uL (ref 150–400)
RBC: 4.19 MIL/uL (ref 3.87–5.11)
RDW: 11.5 % (ref 11.5–15.5)
WBC: 7.9 10*3/uL (ref 4.0–10.5)
nRBC: 0 % (ref 0.0–0.2)

## 2022-06-04 LAB — BASIC METABOLIC PANEL
Anion gap: 9 (ref 5–15)
BUN: 7 mg/dL (ref 6–20)
CO2: 23 mmol/L (ref 22–32)
Calcium: 8.8 mg/dL — ABNORMAL LOW (ref 8.9–10.3)
Chloride: 107 mmol/L (ref 98–111)
Creatinine, Ser: 0.62 mg/dL (ref 0.44–1.00)
GFR, Estimated: >60 mL/min (ref >60)
Glucose, Bld: 114 mg/dL — ABNORMAL HIGH (ref 70–99)
Potassium: 3.6 mmol/L (ref 3.5–5.1)
Sodium: 139 mmol/L (ref 135–145)

## 2022-06-04 LAB — URINALYSIS, ROUTINE W REFLEX MICROSCOPIC
Bilirubin Urine: NEGATIVE
Glucose, UA: NEGATIVE mg/dL
Hgb urine dipstick: NEGATIVE
Ketones, ur: NEGATIVE mg/dL
Leukocytes,Ua: NEGATIVE
Nitrite: NEGATIVE
Protein, ur: NEGATIVE mg/dL
Specific Gravity, Urine: 1.007 (ref 1.005–1.030)
pH: 6.5 (ref 5.0–8.0)

## 2022-06-04 LAB — PREGNANCY, URINE: Preg Test, Ur: NEGATIVE

## 2022-06-04 NOTE — ED Triage Notes (Signed)
Patient here POV from Home.  Endorses Dizziness associated with Tachycardia and a Headache that began at 1900 today.   No Known Fevers.   NAD Noted during Triage. A&Ox4. GCS 15. Ambulatory.

## 2022-06-05 ENCOUNTER — Emergency Department (HOSPITAL_BASED_OUTPATIENT_CLINIC_OR_DEPARTMENT_OTHER)
Admission: EM | Admit: 2022-06-05 | Discharge: 2022-06-05 | Disposition: A | Discharge status: Home / Self Care | Payer: Medicaid Other | Attending: Emergency Medicine | Admitting: Emergency Medicine

## 2022-06-05 DIAGNOSIS — F419 Anxiety disorder, unspecified: Secondary | ICD-10-CM

## 2022-06-05 DIAGNOSIS — R42 Dizziness and giddiness: Secondary | ICD-10-CM

## 2022-06-05 NOTE — ED Provider Notes (Addendum)
[facility]  Provider Note  CSN: [id number] Arrival date & time: 06/04/22 2231  History Chief Complaint  Patient presents with   Dizziness    Michelle [name] is a 31 y.o. female with history of multiple medical and mental health problems including anxiety reports she has been feeling dizzy, nervous, and shaky today. She reports her HR is usually 110s but today her FitBit reported a HR of 60 and so that was worrying to her. She also reports some increased stress due to mother being in the hospital for heart problems. She was in the ED for nonspecific abdominal pain 2 days ago and was given an Rx for Reglan at discharge which she took yesterday but not today. She is feeling some restless in her legs.    Home Medications Prior to Admission medications   Medication Sig Start Date End Date Taking? Authorizing Provider  albuterol (VENTOLIN HFA) 108 (90 Base) MCG/ACT inhaler Inhale 2 puffs into the lungs every 4 (four) hours as needed for wheezing or shortness of breath. 07/21/21   Althea Charon, FNP  cetirizine (ZYRTEC) 10 MG tablet TAKE 2 TABLETS(20 MG) BY MOUTH TWICE DAILY 04/20/22   Garnet Sierras, DO  EPINEPHrine (EPIPEN 2-PAK) 0.3 mg/0.3 mL IJ SOAJ injection Inject 0.3 mg into the muscle once as needed for anaphylaxis. 03/22/22   Garnet Sierras, DO  famotidine (PEPCID) 20 MG tablet TAKE 1 TABLET(20 MG) BY MOUTH TWICE DAILY 04/20/22   Garnet Sierras, DO  ibuprofen (ADVIL) 800 MG tablet Take 1 tablet (800 mg total) by mouth every 6 (six) hours as needed for moderate pain. 10/16/21   Orpah Greek, MD  metoCLOPramide (REGLAN) 10 MG tablet Take 1 tablet (10 mg total) by mouth every 6 (six) hours. 06/02/22   Rex Kras, PA  propranolol (INDERAL) 20 MG tablet Take 1 tablet every night for 1 week, then increase to 1 tablet twice a day 04/25/22   Cameron Sprang, MD  fluticasone [facility]) 50 MCG/ACT nasal spray Place 1 spray into both nostrils daily. Patient not taking: No sig  reported 01/06/19 08/06/20  Garnet Sierras, DO  nortriptyline (PAMELOR) 25 MG capsule Take 1 capsule (25 mg total) by mouth at bedtime. 04/09/20 08/06/20  Cameron Sprang, MD     Allergies    Adhesive [tape], Bee venom, and Amoxil [amoxicillin]   Review of Systems   Review of Systems Please see HPI for pertinent positives and negatives  Physical Exam BP 113/67   Pulse 87   Temp 97.9 F (36.6 C) (Oral)   Resp 20   Ht '5\' 2"'$  (1.575 m)   Wt 70.2 kg   LMP 08/14/2016 (Exact Date)   SpO2 100%   BMI 28.31 kg/m   Physical Exam Vitals and nursing note reviewed.  Constitutional:      Appearance: Normal appearance.  HENT:     Head: Normocephalic and atraumatic.     Nose: Nose normal.     Mouth/Throat:     Mouth: Mucous membranes are moist.  Eyes:     Extraocular Movements: Extraocular movements intact.     Conjunctiva/sclera: Conjunctivae normal.  Cardiovascular:     Rate and Rhythm: Normal rate.  Pulmonary:     Effort: Pulmonary effort is normal.     Breath sounds: Normal breath sounds.  Abdominal:     General: Abdomen is flat.     Palpations: Abdomen is soft.     Tenderness: There is no abdominal tenderness.  Musculoskeletal:        General: No swelling. Normal range of motion.     Cervical back: Neck supple.  Skin:    General: Skin is warm and dry.  Neurological:     General: No focal deficit present.     Mental Status: She is alert.  Psychiatric:        Mood and Affect: Mood normal.     ED Results / Procedures / Treatments   EKG EKG Interpretation  Date/Time:  Sunday June 04 2022 22:52:51 EST Ventricular Rate:  90 PR Interval:  110 QRS Duration: 70 QT Interval:  352 QTC Calculation: 430 R Axis:   70 Text Interpretation: Sinus rhythm with short PR Otherwise normal ECG When compared with ECG of 25-Dec-2021 23:44, No significant change was found Confirmed by Calvert Cantor [phone]) on 06/05/2022 1:34:57 AM  Procedures Procedures  Medications Ordered in  the ED Medications - No data to display  Initial Impression and Plan  Patient here with vague symptoms of nervousness, shakiness and restless legs. Suspect either anxiety or side effects from reglan. Her vitals and exam are reassuring. Labs done in triage show normal CBC, BMP and UA. She has an unremarkable EKG. She was reassured no emergent medical condition is identified tonight. Encouraged to follow up with PCP. Avoid Reglan as this may have caused some of her symptoms. RTED for any other concerns.   ED Course       MDM Rules/Calculators/A&P Medical Decision Making Problems Addressed: Anxiety: acute illness or injury Dizziness: acute illness or injury  Amount and/or Complexity of Data Reviewed Labs: ordered. Decision-making details documented in ED Course. ECG/medicine tests: ordered and independent interpretation performed. Decision-making details documented in ED Course.  Risk Prescription drug management.    Final Clinical Impression(s) / ED Diagnoses Final diagnoses:  Dizziness  Anxiety    Rx / DC Orders ED Discharge Orders     None        Truddie Hidden, MD 06/05/22 0143    Truddie Hidden, MD 06/05/22 [phone]

## 2022-06-06 DIAGNOSIS — R112 Nausea with vomiting, unspecified: Secondary | ICD-10-CM | POA: Diagnosis not present

## 2022-06-07 DIAGNOSIS — R112 Nausea with vomiting, unspecified: Secondary | ICD-10-CM | POA: Diagnosis not present

## 2022-06-10 ENCOUNTER — Emergency Department (HOSPITAL_BASED_OUTPATIENT_CLINIC_OR_DEPARTMENT_OTHER)
Admission: EM | Admit: 2022-06-10 | Discharge: 2022-06-10 | Disposition: A | Discharge status: Home / Self Care | Payer: Medicaid Other | Attending: Emergency Medicine | Admitting: Emergency Medicine

## 2022-06-10 ENCOUNTER — Encounter (HOSPITAL_BASED_OUTPATIENT_CLINIC_OR_DEPARTMENT_OTHER): Payer: Self-pay

## 2022-06-10 ENCOUNTER — Other Ambulatory Visit: Payer: Self-pay

## 2022-06-10 ENCOUNTER — Emergency Department (HOSPITAL_BASED_OUTPATIENT_CLINIC_OR_DEPARTMENT_OTHER): Payer: Medicaid Other

## 2022-06-10 DIAGNOSIS — N1330 Unspecified hydronephrosis: Secondary | ICD-10-CM | POA: Diagnosis not present

## 2022-06-10 DIAGNOSIS — K5900 Constipation, unspecified: Secondary | ICD-10-CM | POA: Diagnosis not present

## 2022-06-10 DIAGNOSIS — R109 Unspecified abdominal pain: Secondary | ICD-10-CM | POA: Diagnosis not present

## 2022-06-10 DIAGNOSIS — I10 Essential (primary) hypertension: Secondary | ICD-10-CM | POA: Diagnosis not present

## 2022-06-10 DIAGNOSIS — Z79899 Other long term (current) drug therapy: Secondary | ICD-10-CM | POA: Insufficient documentation

## 2022-06-10 LAB — CBC
HCT: 42.7 % (ref 36.0–46.0)
Hemoglobin: 14.4 g/dL (ref 12.0–15.0)
MCH: 30.6 pg (ref 26.0–34.0)
MCHC: 33.7 g/dL (ref 30.0–36.0)
MCV: 90.7 fL (ref 80.0–100.0)
Platelets: 283 10*3/uL (ref 150–400)
RBC: 4.71 MIL/uL (ref 3.87–5.11)
RDW: 11.7 % (ref 11.5–15.5)
WBC: 9.3 10*3/uL (ref 4.0–10.5)
nRBC: 0 % (ref 0.0–0.2)

## 2022-06-10 LAB — COMPREHENSIVE METABOLIC PANEL
ALT: 13 U/L (ref 0–44)
AST: 17 U/L (ref 15–41)
Albumin: 4.4 g/dL (ref 3.5–5.0)
Alkaline Phosphatase: 58 U/L (ref 38–126)
Anion gap: 12 (ref 5–15)
BUN: 10 mg/dL (ref 6–20)
CO2: 20 mmol/L — ABNORMAL LOW (ref 22–32)
Calcium: 9.2 mg/dL (ref 8.9–10.3)
Chloride: 103 mmol/L (ref 98–111)
Creatinine, Ser: 0.68 mg/dL (ref 0.44–1.00)
GFR, Estimated: >60 mL/min (ref >60)
Glucose, Bld: 100 mg/dL — ABNORMAL HIGH (ref 70–99)
Potassium: 4.3 mmol/L (ref 3.5–5.1)
Sodium: 135 mmol/L (ref 135–145)
Total Bilirubin: 0.3 mg/dL (ref 0.3–1.2)
Total Protein: 7.5 g/dL (ref 6.5–8.1)

## 2022-06-10 LAB — LIPASE, BLOOD: Lipase: 21 U/L (ref 11–51)

## 2022-06-10 LAB — URINALYSIS, ROUTINE W REFLEX MICROSCOPIC
Bilirubin Urine: NEGATIVE
Glucose, UA: NEGATIVE mg/dL
Hgb urine dipstick: NEGATIVE
Ketones, ur: NEGATIVE mg/dL
Leukocytes,Ua: NEGATIVE
Nitrite: NEGATIVE
Protein, ur: NEGATIVE mg/dL
Specific Gravity, Urine: 1.005 (ref 1.005–1.030)
pH: 7 (ref 5.0–8.0)

## 2022-06-10 MED ORDER — KETOROLAC TROMETHAMINE 15 MG/ML IJ SOLN
15.0000 mg | Freq: Once | INTRAMUSCULAR | Status: AC
  Administered 2022-06-10: 15 mg via INTRAVENOUS
  Filled 2022-06-10: qty 1

## 2022-06-10 MED ORDER — IOHEXOL 300 MG/ML  SOLN
100.0000 mL | Freq: Once | INTRAMUSCULAR | Status: AC | PRN
  Administered 2022-06-10: 80 mL via INTRAVENOUS

## 2022-06-10 NOTE — ED Notes (Addendum)
C/o initial nausea, lower abd pain and constipation. Adverse reaction to Reglan. Given zofran and senna w/o BM. Then tried glycerin suppository w/o BM. Then tried enema and had moderate BM last night. Still feels like she should still be able to have more of a BM. Pain moved from lower abd to upper abd. Denies: bleeding, vomiting, diarrhea or fever.

## 2022-06-10 NOTE — ED Triage Notes (Signed)
She reports persistent low abd. Discomfort and constipation. Seen here and at an urgent care recently for same. She is ambulatory and in no distress.

## 2022-06-10 NOTE — ED Provider Notes (Signed)
[name] DEPT Provider Note   CSN: [id number] Arrival date & time: 06/10/22  1019     History  Chief Complaint  Patient presents with   Abdominal Pain   Constipation    Michelle [name] is a 31 y.o. female.  Patient with history of of migraine, hypertension, status post hysterectomy, appendectomy presents today with complaints of abdominal pain. She states that same began initially when she was seen on 11/17 and has been persistent since then. She states that the pain has continued to be in her lower abdomen. Was seen here and had unremarkable work-up and was given Reglan for same but states that she had adverse reactions to the Reglan and has therefore not been taking it. She states that she has been intermittently nauseous but denies any vomiting. States that she thought her pain was likely due to constipation given she has not had a full bowel movement in a few days. She denies hematochezia or melena. She is passing normal flatus. She has tried her home Zofran and Senna, a glycerin suppository, and a fleets enema and states that she has only had 1 moderately sized bowel movement. She denies fevers, chills, chest pain, shortness of breath, hematuria, dysuria, or vaginal discharge. Hx hysterectomy without removal of ovaries.    The history is provided by the patient. No language interpreter was used.  Abdominal Pain Associated symptoms: constipation   Constipation Associated symptoms: abdominal pain        Home Medications Prior to Admission medications   Medication Sig Start Date End Date Taking? Authorizing Provider  albuterol (VENTOLIN HFA) 108 (90 Base) MCG/ACT inhaler Inhale 2 puffs into the lungs every 4 (four) hours as needed for wheezing or shortness of breath. 07/21/21   Althea Charon, FNP  cetirizine (ZYRTEC) 10 MG tablet TAKE 2 TABLETS(20 MG) BY MOUTH TWICE DAILY 04/20/22   Garnet Sierras, DO  EPINEPHrine (EPIPEN 2-PAK) 0.3 mg/0.3 mL IJ SOAJ  injection Inject 0.3 mg into the muscle once as needed for anaphylaxis. 03/22/22   Garnet Sierras, DO  famotidine (PEPCID) 20 MG tablet TAKE 1 TABLET(20 MG) BY MOUTH TWICE DAILY 04/20/22   Garnet Sierras, DO  ibuprofen (ADVIL) 800 MG tablet Take 1 tablet (800 mg total) by mouth every 6 (six) hours as needed for moderate pain. 10/16/21   Orpah Greek, MD  metoCLOPramide (REGLAN) 10 MG tablet Take 1 tablet (10 mg total) by mouth every 6 (six) hours. 06/02/22   Rex Kras, PA  propranolol (INDERAL) 20 MG tablet Take 1 tablet every night for 1 week, then increase to 1 tablet twice a day 04/25/22   Cameron Sprang, MD  fluticasone [facility] 50 MCG/ACT nasal spray Place 1 spray into both nostrils daily. Patient not taking: No sig reported 01/06/19 08/06/20  Garnet Sierras, DO  nortriptyline (PAMELOR) 25 MG capsule Take 1 capsule (25 mg total) by mouth at bedtime. 04/09/20 08/06/20  Cameron Sprang, MD      Allergies    Adhesive [tape], Bee venom, and Amoxil [amoxicillin]    Review of Systems   Review of Systems  Gastrointestinal:  Positive for abdominal pain and constipation.  All other systems reviewed and are negative.   Physical Exam Updated Vital Signs BP 112/79   Pulse 81   Temp 97.9 F (36.6 C) (Oral)   Resp 15   LMP 08/14/2016 (Exact Date)   SpO2 100%  Physical Exam Vitals and nursing note reviewed.  Constitutional:  General: She is not in acute distress.    Appearance: Normal appearance. She is normal weight. She is not ill-appearing, toxic-appearing or diaphoretic.  HENT:     Head: Normocephalic and atraumatic.  Cardiovascular:     Rate and Rhythm: Normal rate.  Pulmonary:     Effort: Pulmonary effort is normal. No respiratory distress.  Abdominal:     General: Abdomen is flat.     Palpations: Abdomen is soft.     Tenderness: There is generalized abdominal tenderness.  Musculoskeletal:        General: Normal range of motion.     Cervical back: Normal range of motion.   Skin:    General: Skin is warm and dry.  Neurological:     General: No focal deficit present.     Mental Status: She is alert.  Psychiatric:        Mood and Affect: Mood normal.        Behavior: Behavior normal.    ED Results / Procedures / Treatments   Labs (all labs ordered are listed, but only abnormal results are displayed) Labs Reviewed  COMPREHENSIVE METABOLIC PANEL - Abnormal; Notable for the following components:      Result Value   CO2 20 (*)    Glucose, Bld 100 (*)    All other components within normal limits  URINALYSIS, ROUTINE W REFLEX MICROSCOPIC - Abnormal; Notable for the following components:   Color, Urine COLORLESS (*)    All other components within normal limits  LIPASE, BLOOD  CBC    EKG None  Radiology CT ABDOMEN PELVIS W CONTRAST  Result Date: 06/10/2022 CLINICAL DATA:  Abdominal pain EXAM: CT ABDOMEN AND PELVIS WITH CONTRAST TECHNIQUE: Multidetector CT imaging of the abdomen and pelvis was performed using the standard protocol following bolus administration of intravenous contrast. RADIATION DOSE REDUCTION: This exam was performed according to the departmental dose-optimization program which includes automated exposure control, adjustment of the mA and/or kV according to patient size and/or use of iterative reconstruction technique. CONTRAST:  53m OMNIPAQUE IOHEXOL 300 MG/ML  SOLN COMPARISON:  CT abdomen and pelvis dated November 07, 2021 FINDINGS: Lower chest: No acute abnormality. Hepatobiliary: No focal liver abnormality is seen. No gallstones, gallbladder wall thickening, or biliary dilatation. Pancreas: Unremarkable. No pancreatic ductal dilatation or surrounding inflammatory changes. Spleen: Normal in size without focal abnormality. Adrenals/Urinary Tract: Bilateral adrenal glands are unremarkable. Hydronephrosis. No nephrolithiasis. Bladder is unremarkable. Stomach/Bowel: Stomach is within normal limits. Appendix is surgically absent. No evidence of  bowel wall thickening, distention, or inflammatory changes. Vascular/Lymphatic: No significant vascular findings are present. No enlarged abdominal or pelvic lymph nodes. Reproductive: No adnexal masses. Other: No abdominal wall hernia or abnormality. No abdominopelvic ascites. Musculoskeletal: No acute or significant osseous findings. IMPRESSION: No acute findings in the abdomen or pelvis. Electronically Signed   By: LYetta GlassmanM.D.   On: 06/10/2022 13:34    Procedures Procedures    Medications Ordered in ED Medications  ketorolac (TORADOL) 15 MG/ML injection 15 mg (15 mg Intravenous Given 06/10/22 1229)  iohexol (OMNIPAQUE) 300 MG/ML solution 100 mL (80 mLs Intravenous Contrast Given 06/10/22 1251)    ED Course/ Medical Decision Making/ A&P                           Medical Decision Making Amount and/or Complexity of Data Reviewed Labs: ordered. Radiology: ordered.  Risk Prescription drug management.   This patient is a 31y.o. female  who presents to the ED for concern of abdominal pain, nausea, constipation, this involves an extensive number of treatment options, and is a complaint that carries with it a high risk of complications and morbidity. The emergent differential diagnosis prior to evaluation includes, but is not limited to,  The differential diagnosis for generalized abdominal pain includes, but is not limited to AAA, gastroenteritis, appendicitis, Bowel obstruction, Bowel perforation. Gastroparesis, DKA, Hernia, Inflammatory bowel disease, mesenteric ischemia, pancreatitis, peritonitis SBP, volvulus.   This is not an exhaustive differential.   Past Medical History / Co-morbidities / Social History: Hx hypertension, migraine, hysterectomy, and appendectomy  Additional history: Chart reviewed. Pertinent results include: patient seen recently with similar symptoms, no CT imaging performed at that time  Physical Exam: Physical exam performed. The pertinent findings  include: generalized abdominal pain  Lab Tests: I ordered, and personally interpreted labs.  The pertinent results include:  no acute laboratory abnormalities   Imaging Studies: I ordered imaging studies including CT abdomen pelvis. I independently visualized and interpreted imaging which showed   No acute findings  I agree with the radiologist interpretation.     Medications: I ordered medication including toradol  for pain. Reevaluation of the patient after these medicines showed that the patient improved. I have reviewed the patients home medicines and have made adjustments as needed.    Disposition:  Patient presents today with complaints of abdominal pain x 8 days.  She is afebrile, nontoxic-appearing, in no acute distress with reassuring vital signs. Patient is nontoxic, nonseptic appearing, in no apparent distress.  Patient's pain and other symptoms adequately managed in emergency department.  Fluid bolus given.  Labs, imaging and vitals reviewed.  Patient does not meet the SIRS or Sepsis criteria.  On repeat exam patient does not have a surgical abdomin and there are no peritoneal signs.  No indication of appendicitis, bowel obstruction, bowel perforation, cholecystitis, diverticulitis, PID or ectopic pregnancy.  Upon personal review of patient's CT scan, it does appear that she has some stool burden consistent with constipation.  Will recommend MiraLAX bowel cleanout for same as I suspect this is the etiology of her symptoms.  No further emergent concerns, patient is stable for discharge.  Patient discharged home with recommendations for MiraLAX OTC for symptomatic treatment and given strict instructions for follow-up with their primary care physician.  I have also discussed reasons to return immediately to the ER.  Patient expresses understanding and agrees with plan.  Patient discharged in stable condition.   Final Clinical Impression(s) / ED Diagnoses Final diagnoses:   Constipation, unspecified constipation type    Rx / DC Orders ED Discharge Orders     None     An After Visit Summary was printed and given to the patient.     Nestor Lewandowsky 06/10/22 Randol Kern    Regan Lemming, MD 06/11/22 [phone]

## 2022-06-10 NOTE — Discharge Instructions (Signed)
As we discussed, your workup in the ER today was reassuring for acute findings.  Given findings on your CT scan, your abdominal pain today is likely due to constipation.  For this, I recommend that you do a Miralax bowel cleanout for relief which includes 8 capfulls of Miralax in Gatorade. Drink this in 2 hours on a day where you dont have plans and expect significant bowel movements throughout the day ending in liquid stools.  You may also use Dulcolax chocolate that you can also get over-the-counter.  You may continue forward with 1 capfull of Miralax twice a day if difficulties with bowel movements continue.  Follow-up with your primary care doctor in the next few days for continued evaluation and management of your symptoms  Return if development of any new or worsening symptoms.

## 2022-06-10 NOTE — ED Notes (Signed)
To CT via w/c.

## 2022-06-12 ENCOUNTER — Telehealth: Payer: Self-pay | Admitting: *Deleted

## 2022-06-12 NOTE — Patient Outreach (Signed)
  Care Coordination [facility] Note Transition Care Management Follow-up Telephone Call Date of discharge and from where: 06/10/22 [facility] How have you been since you were released from the hospital? Patient reports pain has improved, but she continues to have nausea. Any questions or concerns? No  Items Reviewed: Did the pt receive and understand the discharge instructions provided? Yes  Medications obtained and verified? Yes  Other? No  Any new allergies since your discharge? No  Dietary orders reviewed? Yes Do you have support at home? Yes   Home Care and Equipment/Supplies: Were home health services ordered? no If so, what is the name of the agency? N/A  Has the agency set up a time to come to the patient's home? not applicable Were any new equipment or medical supplies ordered?  No What is the name of the medical supply agency? N/A Were you able to get the supplies/equipment? not applicable Do you have any questions related to the use of the equipment or supplies? No  Functional Questionnaire: (I = Independent and D = Dependent) ADLs: I  Bathing/Dressing- I  Meal Prep- I  Eating- I  Maintaining continence- I  Transferring/Ambulation- I  Managing Meds- I  Follow up appointments reviewed:  PCP Hospital f/u appt confirmed? Yes  Scheduled to see New PCP on 06/13/22. [facility] f/u appt confirmed?  N/A   Are transportation arrangements needed? No  If their condition worsens, is the pt aware to call PCP or go to the Emergency Dept.? Yes Was the patient provided with contact information for the PCP's office or ED? No Was to pt encouraged to call back with questions or concerns? Yes   Lurena Joiner RN, BSN [place]  [employer] manager

## 2022-06-13 DIAGNOSIS — R112 Nausea with vomiting, unspecified: Secondary | ICD-10-CM | POA: Diagnosis not present

## 2022-06-13 DIAGNOSIS — K5900 Constipation, unspecified: Secondary | ICD-10-CM | POA: Diagnosis not present

## 2022-06-24 ENCOUNTER — Encounter (HOSPITAL_COMMUNITY): Payer: Self-pay | Admitting: *Deleted

## 2022-06-24 ENCOUNTER — Ambulatory Visit (HOSPITAL_COMMUNITY)
Admission: EM | Admit: 2022-06-24 | Discharge: 2022-06-24 | Disposition: A | Discharge status: Home / Self Care | Payer: Medicaid Other

## 2022-06-24 DIAGNOSIS — G43001 Migraine without aura, not intractable, with status migrainosus: Secondary | ICD-10-CM

## 2022-06-24 MED ORDER — ONDANSETRON HCL 4 MG/2ML IJ SOLN
INTRAMUSCULAR | Status: AC
  Filled 2022-06-24: qty 2

## 2022-06-24 MED ORDER — PROMETHAZINE HCL 25 MG/ML IJ SOLN: 25.0000 mg | Freq: Four times a day (QID) | INTRAMUSCULAR | Status: DC | PRN

## 2022-06-24 MED ORDER — DIPHENHYDRAMINE HCL 50 MG/ML IJ SOLN
25.0000 mg | Freq: Once | INTRAMUSCULAR | Status: AC
  Administered 2022-06-24: 25 mg via INTRAMUSCULAR

## 2022-06-24 MED ORDER — KETOROLAC TROMETHAMINE 30 MG/ML IJ SOLN
INTRAMUSCULAR | Status: AC
  Filled 2022-06-24: qty 1

## 2022-06-24 MED ORDER — DIPHENHYDRAMINE HCL 50 MG/ML IJ SOLN
INTRAMUSCULAR | Status: AC
  Filled 2022-06-24: qty 1

## 2022-06-24 MED ORDER — KETOROLAC TROMETHAMINE 30 MG/ML IJ SOLN
60.0000 mg | Freq: Once | INTRAMUSCULAR | Status: AC
  Administered 2022-06-24: 60 mg via INTRAMUSCULAR

## 2022-06-24 MED ORDER — DEXAMETHASONE SODIUM PHOSPHATE 10 MG/ML IJ SOLN
INTRAMUSCULAR | Status: AC
  Filled 2022-06-24: qty 1

## 2022-06-24 MED ORDER — DEXAMETHASONE SODIUM PHOSPHATE 10 MG/ML IJ SOLN
10.0000 mg | Freq: Once | INTRAMUSCULAR | Status: AC
  Administered 2022-06-24: 10 mg via INTRAMUSCULAR

## 2022-06-24 MED ORDER — ONDANSETRON HCL 4 MG/2ML IJ SOLN
4.0000 mg | Freq: Once | INTRAMUSCULAR | Status: AC
  Administered 2022-06-24: 4 mg via INTRAMUSCULAR

## 2022-06-24 NOTE — Discharge Instructions (Signed)
You were given treatment for acute migraine.  Do not drive this evening.  Go home and straight to bed.  Continue to push fluids and rest.  Follow-up with your PCP and neurologist.  If acutely worse symptoms, please present to the nearest emergency department.

## 2022-06-24 NOTE — ED Provider Notes (Signed)
[place]   MRN: [id number] DOB: 25-Jun-1991  Subjective:   Michelle [name] is a 31 y.o. female presenting for left-sided headache x 2 days.  Patient has a history of migraines and says this feels like her typical migraine.  She is accompanied by her significant other.  Currently rates her pain 8 out of 10.  Constant pain.  She feels nauseated and has some light sensitivity.  She tried taking Tylenol today without much relief.  Denies any vomiting.  Denies any chest pain or shortness of breath.  No fever chills or other acute sickness.  Patient has been under stress this week due to loss of an uncle.  Patient is under the care of of neurology for her migraines.  No current facility-administered medications for this encounter.  Current Outpatient Medications:    cetirizine (ZYRTEC) 10 MG tablet, TAKE 2 TABLETS(20 MG) BY MOUTH TWICE DAILY, Disp: 120 tablet, Rfl: 5   famotidine (PEPCID) 20 MG tablet, TAKE 1 TABLET(20 MG) BY MOUTH TWICE DAILY, Disp: 60 tablet, Rfl: 5   ONDANSETRON PO, Take by mouth., Disp: , Rfl:    Polyethylene Glycol 3350 (MIRALAX PO), Take by mouth., Disp: , Rfl:    albuterol (VENTOLIN HFA) 108 (90 Base) MCG/ACT inhaler, Inhale 2 puffs into the lungs every 4 (four) hours as needed for wheezing or shortness of breath., Disp: 18 g, Rfl: 1   EPINEPHrine (EPIPEN 2-PAK) 0.3 mg/0.3 mL IJ SOAJ injection, Inject 0.3 mg into the muscle once as needed for anaphylaxis., Disp: 2 each, Rfl: 1   ibuprofen (ADVIL) 800 MG tablet, Take 1 tablet (800 mg total) by mouth every 6 (six) hours as needed for moderate pain., Disp: 20 tablet, Rfl: 0   metoCLOPramide (REGLAN) 10 MG tablet, Take 1 tablet (10 mg total) by mouth every 6 (six) hours., Disp: 16 tablet, Rfl: 0   propranolol (INDERAL) 20 MG tablet, Take 1 tablet every night for 1 week, then increase to 1 tablet twice a day, Disp: 60 tablet, Rfl: 6   Allergies  Allergen Reactions   Adhesive [Tape] Hives and Other (See  Comments)    EKG or heart monitor pads- caused raised, red areas (WELTS) on the skin; "sensitive" pads fell off   Bee Venom Hives and Other (See Comments)    Welts, also   Reglan [Metoclopramide]     "Bad side effects: severe depression, anxiety, body tremors"   Amoxil [Amoxicillin] Rash    Past Medical History:  Diagnosis Date   Anxiety    Asthma    Bipolar disorder ([place])    no current med.   Depression    no current med.   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   Migraine    Nephrolithiasis    PTSD (post-traumatic stress disorder)    PTSD (post-traumatic stress disorder)    Pyelonephritis 01/16/2019   Seizures ([place])    Tachycardia    TMJ (dislocation of temporomandibular joint)    Vertigo      Past Surgical History:  Procedure Laterality Date   ABDOMINAL HYSTERECTOMY  ADENOIDECTOMY, TONSILLECTOMY AND MYRINGOTOMY WITH TUBE PLACEMENT  30's   APPENDECTOMY     CESAREAN SECTION  06/20/2012   Procedure: CESAREAN SECTION;  Surgeon: Emily Filbert, MD;  Location: [facility];  Service: Obstetrics;  Laterality: N/A;   HIP SURGERY     LAPAROSCOPIC APPENDECTOMY  07/25/2012   Procedure: APPENDECTOMY LAPAROSCOPIC;  Surgeon: Zenovia Jarred, MD;  Location: [facility];  Service: General;  Laterality: N/A;   LAPAROSCOPIC TUBAL LIGATION Bilateral 08/04/2014   Procedure: BILATERAL LAPAROSCOPIC TUBAL LIGATION;  Surgeon: Woodroe Mode, MD;  Location: [facility];  Service: Gynecology;  Laterality: Bilateral;   LIPOMA EXCISION N/A 09/16/2015   Procedure: EXCISION LIPOMA LUMBAR REGION;  Surgeon: Donnie Mesa, MD;  Location: [facility];   Service: General;  Laterality: N/A;   TONSILLECTOMY     WISDOM TOOTH EXTRACTION      Family History  Problem Relation Age of Onset   Diabetes Mother    Anesthesia problems Mother        hard to wake up post-op   Atrial fibrillation Mother    Congestive Heart Failure Mother    Diabetes Father    Heart disease Father    Diabetes Sister    Anesthesia problems Sister        hard to wake up post-op   Colon cancer Maternal Grandfather    Breast cancer Paternal Grandmother    Colon cancer Paternal Grandfather    Colon polyps Maternal Aunt    Eczema Neg Hx    Allergic rhinitis Neg Hx    Asthma Neg Hx     Social History   Tobacco Use   Smoking status: Former    Packs/day: 0.00    Years: 14.00    Total pack years: 0.00    Types: Cigarettes    Quit date: 03/17/2017    Years since quitting: 5.2    Passive exposure: Never   Smokeless tobacco: Never  Vaping Use   Vaping Use: Some days  Substance Use Topics   Alcohol use: No   Drug use: No    ROS REFER TO HPI FOR PERTINENT POSITIVES AND NEGATIVES   Objective:   Vitals: BP 112/86   Pulse (!) 110   Temp 98.3 F (36.8 C) (Oral)   Resp 16   LMP 08/14/2016 (Exact Date)   SpO2 99%   Physical Exam Vitals and nursing note reviewed.  Constitutional:      Appearance: Normal appearance. She is normal weight. She is not toxic-appearing.     Comments: Lying on exam table clearly in discomfort  HENT:     Head: Normocephalic and atraumatic.     Right Ear: External ear normal.     Left Ear: External ear normal.     Nose: Nose normal.     Mouth/Throat:     Mouth: Mucous membranes are moist.  Eyes:     Extraocular Movements: Extraocular movements intact.     Right eye: Normal extraocular motion.     Left eye: Normal extraocular motion.     Conjunctiva/sclera: Conjunctivae normal.     Pupils: Pupils are equal, round, and reactive to light.  Cardiovascular:     Rate and Rhythm: Regular rhythm. Tachycardia present.      Pulses: Normal pulses.     Heart sounds: Normal heart sounds.  Pulmonary:     Effort: Pulmonary effort is normal.     Breath sounds: Normal breath sounds.  Abdominal:     General: Abdomen is flat.  Musculoskeletal:        General: Normal range of motion.     Cervical back: Normal range of motion and neck supple.  Skin:    General: Skin is warm and dry.  Neurological:     General: No focal deficit present.     Mental Status: She is alert and oriented to person, place, and time.     Cranial Nerves: No cranial nerve deficit.     Sensory: No sensory deficit.     Motor: No weakness.     Coordination: Romberg sign negative. Coordination normal.     Gait: Gait normal.  Psychiatric:        Mood and Affect: Mood normal.        Behavior: Behavior normal.        Thought Content: Thought content normal.        Judgment: Judgment normal.     No results found for this or any previous visit (from the past 24 hour(s)).  Assessment and Plan :   PDMP not reviewed this encounter.  1. Migraine without aura and with status migrainosus, not intractable    Typical migraine for patient with her longstanding history of migraines.  No red flags on exam.  She is requesting a migraine cocktail that she has had in the past and has provided relief for her.  Therefore gave Decadron 10 mg, Benadryl 25 mg, Toradol 60 mg, Zofran 4 mg all via IM. Pt aware of risks vs benefits and possible adverse reactions. Patient tolerated well.  Her significant other is going to drive her home and she will go straight to bed.  Should she develop sudden worsening symptoms, she will present to the emergency department.  She is going to follow-up with her PCP and neurologist next week.    AllwardtRanda Evens, PA-C 06/24/22 1838

## 2022-06-24 NOTE — ED Triage Notes (Signed)
C/O HA x 2 days. States feels like her typical migraine with nausea and slight photosensitivity. Has tried Tyl 1g (at possibly 1300 and again at 1430) without relief. Pt concerned about possibly taking too much Tyl as well.

## 2022-06-29 DIAGNOSIS — R112 Nausea with vomiting, unspecified: Secondary | ICD-10-CM | POA: Diagnosis not present

## 2022-07-05 ENCOUNTER — Other Ambulatory Visit: Payer: Self-pay | Admitting: Family

## 2022-07-13 DIAGNOSIS — S29019A Strain of muscle and tendon of unspecified wall of thorax, initial encounter: Secondary | ICD-10-CM | POA: Diagnosis not present

## 2022-07-23 ENCOUNTER — Encounter (HOSPITAL_COMMUNITY): Payer: Self-pay | Admitting: *Deleted

## 2022-07-23 ENCOUNTER — Ambulatory Visit (INDEPENDENT_AMBULATORY_CARE_PROVIDER_SITE_OTHER): Payer: Medicaid Other

## 2022-07-23 ENCOUNTER — Other Ambulatory Visit: Payer: Self-pay

## 2022-07-23 ENCOUNTER — Ambulatory Visit (HOSPITAL_COMMUNITY)
Admission: EM | Admit: 2022-07-23 | Discharge: 2022-07-23 | Disposition: A | Discharge status: Home / Self Care | Payer: Medicaid Other | Attending: Emergency Medicine | Admitting: Emergency Medicine

## 2022-07-23 DIAGNOSIS — R1012 Left upper quadrant pain: Secondary | ICD-10-CM

## 2022-07-23 DIAGNOSIS — R109 Unspecified abdominal pain: Secondary | ICD-10-CM | POA: Diagnosis not present

## 2022-07-23 LAB — POCT URINALYSIS DIPSTICK, ED / UC
Bilirubin Urine: NEGATIVE
Glucose, UA: NEGATIVE mg/dL
Hgb urine dipstick: NEGATIVE
Ketones, ur: NEGATIVE mg/dL
Leukocytes,Ua: NEGATIVE
Nitrite: NEGATIVE
Protein, ur: NEGATIVE mg/dL
Specific Gravity, Urine: 1.02 (ref 1.005–1.030)
Urobilinogen, UA: 0.2 mg/dL (ref 0.0–1.0)
pH: 6.5 (ref 5.0–8.0)

## 2022-07-23 MED ORDER — TAMSULOSIN HCL 0.4 MG PO CAPS: 0.4000 mg | ORAL_CAPSULE | Freq: Every day | ORAL | 0 refills | Status: AC

## 2022-07-23 NOTE — Discharge Instructions (Addendum)
Your urine was negative for urinary tract infection or for blood.   You have a few stones in your right kidney that we can see on x-ray.  I am going to go ahead and start you on Flomax in case this is a kidney stone.  Continue pushing fluids.  Take 1000 mg Tylenol 3 times a day.  You may want to try MiraLAX and docusate again as you do have some stool in the colon.  Go to the ER if your pain changes, gets worse, you have fevers above 100.4, or for other concerns.

## 2022-07-23 NOTE — ED Provider Notes (Signed)
HPI  SUBJECTIVE:  Michelle [name] is a 32 y.o. female who presents with nonmigratory, nonradiating, crampy left upper quadrant pain starting today that lasts minutes.  She had a normal bowel movement this morning, which did not affect her pain.  No nausea, vomiting, abdominal distention, dysuria, urgency, change in her baseline urinary frequency, cloudy or odorous urine, hematuria.  No fevers, coughing, wheezing, chest pain, shortness of breath.  No belching, waterbrash, burning chest pain.  No recent sore throat.  She denies taking NSAIDs.  She states that she does not drink.  She states that she has had similar symptoms with previous episodes of nephrolithiasis although the pain is usually located a little lower down her abdomen.  She tried a heating pad with improvement in her symptoms.  Symptoms are worse with standing up and walking.  She has a past medical history of asthma, GERD, constipation, seizures, pyelonephritis resulting in sepsis, obstructing nephrolithiasis, Erler's Danlos syndrome, medullary sponge kidneys, tachycardia, status post hysterectomy and appendectomy.  PCP: [facility].    Past Medical History:  Diagnosis Date   Anxiety    Asthma    Bipolar disorder ([place])    no current med.   Depression    no current med.   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   Migraine    Nephrolithiasis    PTSD (post-traumatic stress disorder)    PTSD (post-traumatic stress disorder)    Pyelonephritis 01/16/2019   Seizures ([place])    Tachycardia    TMJ (dislocation of temporomandibular joint)    Vertigo     Past Surgical History:   Procedure Laterality Date   ABDOMINAL HYSTERECTOMY     ADENOIDECTOMY, TONSILLECTOMY AND MYRINGOTOMY WITH TUBE PLACEMENT  1990's   APPENDECTOMY     CESAREAN SECTION  06/20/2012   Procedure: CESAREAN SECTION;  Surgeon: Emily Filbert, MD;  Location: [facility];  Service: Obstetrics;  Laterality: N/A;   HIP SURGERY     LAPAROSCOPIC APPENDECTOMY  07/25/2012   Procedure: APPENDECTOMY LAPAROSCOPIC;  Surgeon: Zenovia Jarred, MD;  Location: [facility];  Service: General;  Laterality: N/A;   LAPAROSCOPIC TUBAL LIGATION Bilateral 08/04/2014   Procedure: BILATERAL LAPAROSCOPIC TUBAL LIGATION;  Surgeon: Woodroe Mode, MD;  Location: [facility];  Service: Gynecology;  Laterality: Bilateral;   LIPOMA EXCISION N/A 09/16/2015   Procedure: EXCISION LIPOMA LUMBAR REGION;  Surgeon: Donnie Mesa, MD;  Location: [facility];  Service: General;  Laterality: N/A;   TONSILLECTOMY     WISDOM TOOTH EXTRACTION      Family History  Problem Relation Age of Onset   Diabetes Mother    Anesthesia problems Mother        hard to wake up post-op   Atrial fibrillation Mother    Congestive Heart Failure Mother    Diabetes Father    Heart disease Father    Diabetes Sister    Anesthesia problems Sister        hard to wake up post-op   Colon cancer Maternal Grandfather    Breast cancer Paternal Grandmother    Colon cancer  Paternal Grandfather    Colon polyps Maternal Aunt    Eczema Neg Hx    Allergic rhinitis Neg Hx    Asthma Neg Hx     Social History   Tobacco Use   Smoking status: Former    Packs/day: 0.00    Years: 14.00    Total pack years: 0.00    Types: Cigarettes    Quit date: 03/17/2017    Years since quitting: 5.3    Passive exposure: Never   Smokeless tobacco: Never  Vaping Use   Vaping Use: Some days  Substance Use Topics   Alcohol use: No   Drug use: No    No current facility-administered medications for this encounter.  Current Outpatient Medications:    tamsulosin (FLOMAX) 0.4 MG  CAPS capsule, Take 1 capsule (0.4 mg total) by mouth at bedtime for 7 days., Disp: 7 capsule, Rfl: 0   cetirizine (ZYRTEC) 10 MG tablet, TAKE 2 TABLETS(20 MG) BY MOUTH TWICE DAILY, Disp: 120 tablet, Rfl: 5   EPINEPHrine (EPIPEN 2-PAK) 0.3 mg/0.3 mL IJ SOAJ injection, Inject 0.3 mg into the muscle once as needed for anaphylaxis., Disp: 2 each, Rfl: 1   famotidine (PEPCID) 20 MG tablet, TAKE 1 TABLET(20 MG) BY MOUTH TWICE DAILY, Disp: 60 tablet, Rfl: 5   metoCLOPramide (REGLAN) 10 MG tablet, Take 1 tablet (10 mg total) by mouth every 6 (six) hours., Disp: 16 tablet, Rfl: 0   ONDANSETRON PO, Take by mouth., Disp: , Rfl:    Polyethylene Glycol 3350 (MIRALAX PO), Take by mouth., Disp: , Rfl:    propranolol (INDERAL) 20 MG tablet, Take 1 tablet every night for 1 week, then increase to 1 tablet twice a day, Disp: 60 tablet, Rfl: 6   VENTOLIN HFA 108 (90 Base) MCG/ACT inhaler, INHALE 2 PUFFS INTO THE LUNGS EVERY 4 HOURS AS NEEDED FOR WHEEZING OR SHORTNESS OF BREATH, Disp: 18 g, Rfl: 1  Allergies  Allergen Reactions   Adhesive [Tape] Hives and Other (See Comments)    EKG or heart monitor pads- caused raised, red areas (WELTS) on the skin; "sensitive" pads fell off   Bee Venom Hives and Other (See Comments)    Welts, also   Reglan [Metoclopramide]     "Bad side effects: severe depression, anxiety, body tremors"   Amoxil [Amoxicillin] Rash     ROS  As noted in HPI.   Physical Exam  BP 115/78   Pulse (!) 103   Temp 98.4 F (36.9 C)   LMP 08/14/2016 (Exact Date)   SpO2 97%   Constitutional: Well developed, well nourished, no acute distress Eyes:  EOMI, conjunctiva normal bilaterally HENT: Normocephalic, atraumatic,mucus membranes moist Respiratory: Normal inspiratory effort, lungs clear bilaterally Cardiovascular: Regular tachycardia, no murmurs, rubs, gallops GI: nondistended soft.  Positive suprapubic, left flank tenderness.  No epigastric, left upper quadrant tenderness.  No palpable  splenomegaly.  Active bowel sounds.  No rebound, guarding. Back: No CVAT skin: No rash, skin intact Musculoskeletal: no deformities Neurologic: Alert & oriented x 3, no focal neuro deficits Psychiatric: Speech and behavior appropriate   ED Course   Medications - No data to display  Orders Placed This Encounter  Procedures   DG Abd 1 View    Standing Status:   Standing    Number of Occurrences:   1    Order Specific Question:   Reason for Exam (SYMPTOM  OR DIAGNOSIS REQUIRED)    Answer:   Left upper quadrant pain today.  History of nephrolithiasis.  Suprapubic, flank tenderness.  Rule out nephrolithiasis versus constipation.   POC Urinalysis dipstick    Standing Status:   Standing    Number of Occurrences:   1    Results for orders placed or performed during the hospital encounter of 07/23/22 (from the past 24 hour(s))  POC Urinalysis dipstick     Status: None   Collection Time: 07/23/22  5:06 PM  Result Value Ref Range   Glucose, UA NEGATIVE NEGATIVE mg/dL   Bilirubin Urine NEGATIVE NEGATIVE   Ketones, ur NEGATIVE NEGATIVE mg/dL   Specific Gravity, Urine 1.020 1.005 - 1.030   Hgb urine dipstick NEGATIVE NEGATIVE   pH 6.5 5.0 - 8.0   Protein, ur NEGATIVE NEGATIVE mg/dL   Urobilinogen, UA 0.2 0.0 - 1.0 mg/dL   Nitrite NEGATIVE NEGATIVE   Leukocytes,Ua NEGATIVE NEGATIVE   DG Abd 1 View  Result Date: 07/23/2022 CLINICAL DATA:  Pain left upper quadrant EXAM: ABDOMEN - 1 upright VIEW COMPARISON:  Radiograph done on 01/26/2021, CT done on 06/10/2022 FINDINGS: Bowel gas pattern is nonspecific. Small amount of stool is seen in the visualized portions of colon. Lower pelvis is not included limiting evaluation of rectum. There are few small calcific densities overlying the right kidney measuring up to 3 mm in size. Left kidney is partly obscured by bowel contents. Visualized lower lung fields and bony structures are unremarkable. IMPRESSION: Bowel gas pattern is nonspecific. Small  amount of stool is seen in colon. There are few small calcific densities overlying the right kidney suggesting renal stones. If there is clinical suspicion for ureteric colic, follow-up CT may be considered. Electronically Signed   By: Elmer Picker M.D.   On: 07/23/2022 17:38    ED Clinical Impression  1. Left upper quadrant abdominal pain      ED Assessment/Plan     Checking UA and KUB.  Patient states pain feels similar to previous nephrolithiasis, although it is in a different place than usual.  She states that with her medullary sponge kidneys, she has multiple stones chronically both of her kidneys.  UA negative for blood, infection.  Reviewed imaging independently.  Severe calcific densities in the right kidney up to 3 mm in size.  Left kidney obscured by bowel contents.  Nonspecific bowel gas pattern.  Small amount of stool seen in colon.  See radiology report for full details.  Discussed with patient that KUB is not very sensitive in picking up nephrolithiasis, so we will start empirically on Flomax.  Will have also her start MiraLAX, docusate.  Follow-up with PCP as needed.  ER return precautions given.  Discussed labs, imaging, MDM, treatment plan, and plan for follow-up with patient. Discussed sn/sx that should prompt return to the ED. patient agrees with plan.   Meds ordered this encounter  Medications   tamsulosin (FLOMAX) 0.4 MG CAPS capsule    Sig: Take 1 capsule (0.4 mg total) by mouth at bedtime for 7 days.    Dispense:  7 capsule    Refill:  0      *This clinic note was created using [employer]. Therefore, there may be occasional mistakes despite careful proofreading.  ?    Melynda Ripple, MD 07/24/22 [phone]

## 2022-07-23 NOTE — ED Triage Notes (Signed)
PT reports Lt upper abd pain since thisAM

## 2022-07-24 ENCOUNTER — Telehealth: Payer: Self-pay | Admitting: Allergy

## 2022-07-24 ENCOUNTER — Other Ambulatory Visit: Payer: Self-pay | Admitting: *Deleted

## 2022-07-24 MED ORDER — EPIPEN 2-PAK 0.3 MG/0.3ML IJ SOAJ: 0.3000 mg | Freq: Once | INTRAMUSCULAR | 1 refills | Status: DC | PRN

## 2022-07-24 NOTE — Telephone Encounter (Signed)
Michelle [name] called in and states She has been trying to get her Epi-Pen filled at [facility] and they keep telling her it is on back order. She would like for [place] to send a prescription for Epi-Pen to [facility] on [address] to see if they are abe to get it.  She wants to know what she should do if no one can get an Epi-Pen for her. Please advise.

## 2022-07-24 NOTE — Telephone Encounter (Signed)
Refill for EpiPen has been sent in to requested pharmacy. Called patient and advised of Rx being sent in. Advised that if they are not able to fill the EpiPen to call back and inform us. Patient verbalized understanding.

## 2022-08-05 ENCOUNTER — Emergency Department (HOSPITAL_BASED_OUTPATIENT_CLINIC_OR_DEPARTMENT_OTHER)
Admission: EM | Admit: 2022-08-05 | Discharge: 2022-08-05 | Disposition: A | Discharge status: Home / Self Care | Payer: Medicaid Other | Attending: Emergency Medicine | Admitting: Emergency Medicine

## 2022-08-05 ENCOUNTER — Encounter (HOSPITAL_BASED_OUTPATIENT_CLINIC_OR_DEPARTMENT_OTHER): Payer: Self-pay | Admitting: Emergency Medicine

## 2022-08-05 ENCOUNTER — Other Ambulatory Visit: Payer: Self-pay

## 2022-08-05 DIAGNOSIS — G43809 Other migraine, not intractable, without status migrainosus: Secondary | ICD-10-CM | POA: Insufficient documentation

## 2022-08-05 DIAGNOSIS — R519 Headache, unspecified: Secondary | ICD-10-CM | POA: Diagnosis present

## 2022-08-05 MED ORDER — KETOROLAC TROMETHAMINE 30 MG/ML IJ SOLN
30.0000 mg | Freq: Once | INTRAMUSCULAR | Status: AC
  Administered 2022-08-05: 30 mg via INTRAMUSCULAR
  Filled 2022-08-05: qty 1

## 2022-08-05 MED ORDER — DIPHENHYDRAMINE HCL 50 MG/ML IJ SOLN
25.0000 mg | Freq: Once | INTRAMUSCULAR | Status: AC
  Administered 2022-08-05: 25 mg via INTRAMUSCULAR
  Filled 2022-08-05: qty 1

## 2022-08-05 MED ORDER — DEXAMETHASONE SODIUM PHOSPHATE 10 MG/ML IJ SOLN
10.0000 mg | Freq: Once | INTRAMUSCULAR | Status: AC
  Administered 2022-08-05: 10 mg via INTRAMUSCULAR
  Filled 2022-08-05: qty 1

## 2022-08-05 NOTE — ED Triage Notes (Signed)
  Patient comes in with migraine that started this morning around 0730.  Patient states she has hx of migraines but doesn't have any prescription medications to take.  No OTC meds.  Attempted to tough it out but states it has gotten worse.  Pain 8/10, aching.

## 2022-08-05 NOTE — ED Provider Notes (Signed)
[facility]  Provider Note  CSN: [id number] Arrival date & time: 08/05/22 2244  History Chief Complaint  Patient presents with   Migraine    Michelle [name] is a 32 y.o. female with history of migraines reports right sided throbbing headache with nausea and photophobia throughout the day today. Similar to previous. She typically goes to Neurology or UC for a 'migraine shot' when she has headaches like this. She is no longer on any preventative medications at home. She is requesting decadron, toradol and benadryl as her typical treatment.    Home Medications Prior to Admission medications   Medication Sig Start Date End Date Taking? Authorizing Provider  cetirizine (ZYRTEC) 10 MG tablet TAKE 2 TABLETS(20 MG) BY MOUTH TWICE DAILY 04/20/22   Garnet Sierras, DO  EPIPEN 2-PAK 0.3 MG/0.3ML SOAJ injection Inject 0.3 mg into the muscle once as needed for anaphylaxis. 07/24/22   Garnet Sierras, DO  famotidine (PEPCID) 20 MG tablet TAKE 1 TABLET(20 MG) BY MOUTH TWICE DAILY 04/20/22   Garnet Sierras, DO  metoCLOPramide (REGLAN) 10 MG tablet Take 1 tablet (10 mg total) by mouth every 6 (six) hours. 06/02/22   Rex Kras, PA  ONDANSETRON PO Take by mouth.    [provider]  Polyethylene Glycol 3350 (MIRALAX PO) Take by mouth.    [provider]  propranolol (INDERAL) 20 MG tablet Take 1 tablet every night for 1 week, then increase to 1 tablet twice a day 04/25/22   Cameron Sprang, MD  VENTOLIN HFA 108 [phone] Base) MCG/ACT inhaler INHALE 2 PUFFS INTO THE LUNGS EVERY 4 HOURS AS NEEDED FOR WHEEZING OR SHORTNESS OF BREATH 07/05/22   Althea Charon, FNP  fluticasone (FLONASE) 50 MCG/ACT nasal spray Place 1 spray into both nostrils daily. Patient not taking: No sig reported 01/06/19 08/06/20  Garnet Sierras, DO  nortriptyline (PAMELOR) 25 MG capsule Take 1 capsule (25 mg total) by mouth at bedtime. 04/09/20 08/06/20  Cameron Sprang, MD     Allergies    Adhesive  [tape], Bee venom, Reglan [metoclopramide], and Amoxil [amoxicillin]   Review of Systems   Review of Systems Please see HPI for pertinent positives and negatives  Physical Exam BP (!) 134/90 (BP Location: Right Arm)   Pulse (!) 102   Temp 97.9 F (36.6 C)   Resp 18   Ht '5\' 2"'$  (1.575 m)   Wt 69.9 kg   LMP 08/14/2016 (Exact Date)   SpO2 100%   BMI 28.17 kg/m   Physical Exam Vitals and nursing note reviewed.  Constitutional:      Appearance: Normal appearance.  HENT:     Head: Normocephalic and atraumatic.     Nose: Nose normal.     Mouth/Throat:     Mouth: Mucous membranes are moist.  Eyes:     Extraocular Movements: Extraocular movements intact.     Conjunctiva/sclera: Conjunctivae normal.  Cardiovascular:     Rate and Rhythm: Normal rate.  Pulmonary:     Effort: Pulmonary effort is normal.     Breath sounds: Normal breath sounds.  Abdominal:     General: Abdomen is flat.     Palpations: Abdomen is soft.     Tenderness: There is no abdominal tenderness.  Musculoskeletal:        General: No swelling. Normal range of motion.     Cervical back: Neck supple.  Skin:    General: Skin is warm and dry.  Neurological:     General: No focal deficit present.     Mental Status: She is alert and oriented to person, place, and time.     Cranial Nerves: No cranial nerve deficit.     Sensory: No sensory deficit.     Motor: No weakness.  Psychiatric:        Mood and Affect: Mood normal.     ED Results / Procedures / Treatments   EKG None  Procedures Procedures  Medications Ordered in the ED Medications  diphenhydrAMINE (BENADRYL) injection 25 mg (has no administration in time range)  ketorolac (TORADOL) 30 MG/ML injection 30 mg (has no administration in time range)  dexamethasone (DECADRON) injection 10 mg (has no administration in time range)    Initial Impression and Plan  Patient here with her typical migraine. Requesting IM medications that have worked for  her in the past. She was offered additional monitoring in the ED for symptom improvement but states she would like to go home after injections to get some rest. Recommend Neurology follow up for further long term management.   ED Course       MDM Rules/Calculators/A&P Medical Decision Making Problems Addressed: Other migraine without status migrainosus, not intractable: chronic illness or injury with exacerbation, progression, or side effects of treatment  Risk Prescription drug management.    Final Clinical Impression(s) / ED Diagnoses Final diagnoses:  Other migraine without status migrainosus, not intractable    Rx / DC Orders ED Discharge Orders     None        Truddie Hidden, MD 08/05/22 2323

## 2022-08-05 NOTE — ED Notes (Signed)
Pt verbalized understanding of d/c instructions, meds, and followup care. Denies questions. VSS, no distress noted. Steady gait to exit with all belongings.  ?

## 2022-08-07 ENCOUNTER — Telehealth: Payer: Self-pay | Admitting: Family Medicine

## 2022-08-07 NOTE — Telephone Encounter (Signed)
After hours emergency line  Received call from patient regarding recent arm injection for her migraines which she routinely receives every few months. She usually gets it within the gluteal region but this time got it on her arm. Reports that the site is erythematous and slightly tender to touch. Denies drainage, fever, chills or any other symptoms at this time. Appointment scheduled in access to care clinic for evaluation, reassurance provided and encouraged patient to maintain close follow up to ensure area resolves. Patient voiced understanding and all questions answered.

## 2022-08-08 ENCOUNTER — Ambulatory Visit (INDEPENDENT_AMBULATORY_CARE_PROVIDER_SITE_OTHER): Payer: Medicaid Other | Admitting: Student

## 2022-08-08 VITALS — BP 108/72 | HR 88 | Temp 98.0°F | Ht 62.0 in | Wt 163.8 lb

## 2022-08-08 DIAGNOSIS — T8090XA Unspecified complication following infusion and therapeutic injection, initial encounter: Secondary | ICD-10-CM | POA: Diagnosis not present

## 2022-08-08 NOTE — Progress Notes (Unsigned)
  SUBJECTIVE:   CHIEF COMPLAINT / HPI:   Migraine cocktail injection site red.  Saturday received a shot of bendadryl, toradol, and decadron for migraine. Usually get's shot in glutes, but got it in shoulder this time. Noticed the next day it was red and swollen. Now it has been going down/resolving. No itching/burning, drainige.   PERTINENT  PMH / PSH:   OBJECTIVE:  LMP 08/14/2016 (Exact Date)  Physical Exam Ext: Right arm with 2 cm slightly hyperpigmented/yellow spot on deltoid, where injection site is, no erythema, swelling, drainage, or tenderness   ASSESSMENT/PLAN:  There are no diagnoses linked to this encounter. No follow-ups on file.  Holley Bouche, MD 08/08/2022, 6:29 AM PGY-***, [facility] {    This will disappear when note is signed, click to select method of visit    :1}

## 2022-08-08 NOTE — Patient Instructions (Signed)
It was great to see you! Thank you for allowing me to participate in your care!  Our plans for today:  - Your injection site looks improved. I'm unsure why the site had gotten swollen, but it has resolved and is of no concern right now. You may have had a reaction to the meds in the shot, causing some inflammation and swelling at the site.   Seek medical care if: This returns/spreads/is warm to touch/draining pus   Take care and seek immediate care sooner if you develop any concerns.   Dr. Holley Bouche, MD [place]

## 2022-08-09 DIAGNOSIS — T8090XA Unspecified complication following infusion and therapeutic injection, initial encounter: Secondary | ICD-10-CM | POA: Insufficient documentation

## 2022-08-09 HISTORY — DX: Unspecified complication following infusion and therapeutic injection, initial encounter: T80.90XA

## 2022-08-09 NOTE — Assessment & Plan Note (Addendum)
Patient comes in for concern of swelling and tenderness at injection site, following a shot of toradol, benadryl, and decadron, for a migraine 3-4 days ago. Patient reported that the next day the area around the site was swollen, red and tender, prompting her to make the appt. By the time she came into clinic, her arm appeared normal and had mostly resolved w/ some minimal yellowing around where injection site was. Site does not appear to be reactive, inflamed, or infected and patient is feeling fine. Reassured patient that she may have had a reaction but it has subsided and she is in no danger.  -F/u prn

## 2022-08-12 ENCOUNTER — Encounter (HOSPITAL_BASED_OUTPATIENT_CLINIC_OR_DEPARTMENT_OTHER): Payer: Self-pay | Admitting: Emergency Medicine

## 2022-08-12 ENCOUNTER — Other Ambulatory Visit: Payer: Self-pay

## 2022-08-12 ENCOUNTER — Emergency Department (HOSPITAL_BASED_OUTPATIENT_CLINIC_OR_DEPARTMENT_OTHER)
Admission: EM | Admit: 2022-08-12 | Discharge: 2022-08-12 | Discharge status: Against Medical Advice | Payer: Medicaid Other | Attending: Emergency Medicine | Admitting: Emergency Medicine

## 2022-08-12 DIAGNOSIS — R519 Headache, unspecified: Secondary | ICD-10-CM | POA: Insufficient documentation

## 2022-08-12 DIAGNOSIS — J3489 Other specified disorders of nose and nasal sinuses: Secondary | ICD-10-CM | POA: Diagnosis not present

## 2022-08-12 DIAGNOSIS — Z5321 Procedure and treatment not carried out due to patient leaving prior to being seen by health care provider: Secondary | ICD-10-CM | POA: Insufficient documentation

## 2022-08-12 NOTE — ED Triage Notes (Signed)
Pain in right sided nostril, causing headache. Seen today at [facility] told it was a staph infection given doxy. Took first dose.  Asking for a second opinion

## 2022-08-13 ENCOUNTER — Emergency Department (HOSPITAL_BASED_OUTPATIENT_CLINIC_OR_DEPARTMENT_OTHER)
Admission: EM | Admit: 2022-08-13 | Discharge: 2022-08-14 | Disposition: A | Discharge status: Home / Self Care | Payer: Medicaid Other | Attending: Emergency Medicine | Admitting: Emergency Medicine

## 2022-08-13 ENCOUNTER — Encounter (HOSPITAL_BASED_OUTPATIENT_CLINIC_OR_DEPARTMENT_OTHER): Payer: Self-pay | Admitting: *Deleted

## 2022-08-13 ENCOUNTER — Other Ambulatory Visit: Payer: Self-pay

## 2022-08-13 DIAGNOSIS — R42 Dizziness and giddiness: Secondary | ICD-10-CM | POA: Diagnosis not present

## 2022-08-13 DIAGNOSIS — I10 Essential (primary) hypertension: Secondary | ICD-10-CM | POA: Insufficient documentation

## 2022-08-13 DIAGNOSIS — T887XXA Unspecified adverse effect of drug or medicament, initial encounter: Secondary | ICD-10-CM | POA: Insufficient documentation

## 2022-08-13 DIAGNOSIS — T50905A Adverse effect of unspecified drugs, medicaments and biological substances, initial encounter: Secondary | ICD-10-CM | POA: Diagnosis not present

## 2022-08-13 DIAGNOSIS — Z79899 Other long term (current) drug therapy: Secondary | ICD-10-CM | POA: Diagnosis not present

## 2022-08-13 DIAGNOSIS — J45909 Unspecified asthma, uncomplicated: Secondary | ICD-10-CM | POA: Diagnosis not present

## 2022-08-13 DIAGNOSIS — Z7951 Long term (current) use of inhaled steroids: Secondary | ICD-10-CM | POA: Insufficient documentation

## 2022-08-13 LAB — CBC WITH DIFFERENTIAL/PLATELET
Abs Immature Granulocytes: 0.07 10*3/uL (ref 0.00–0.07)
Basophils Absolute: 0 10*3/uL (ref 0.0–0.1)
Basophils Relative: 0 %
Eosinophils Absolute: 0.1 10*3/uL (ref 0.0–0.5)
Eosinophils Relative: 0 %
HCT: 39.1 % (ref 36.0–46.0)
Hemoglobin: 13.1 g/dL (ref 12.0–15.0)
Immature Granulocytes: 1 %
Lymphocytes Relative: 27 %
Lymphs Abs: 3.6 10*3/uL (ref 0.7–4.0)
MCH: 30.8 pg (ref 26.0–34.0)
MCHC: 33.5 g/dL (ref 30.0–36.0)
MCV: 91.8 fL (ref 80.0–100.0)
Monocytes Absolute: 1 10*3/uL (ref 0.1–1.0)
Monocytes Relative: 8 %
Neutro Abs: 8.7 10*3/uL — ABNORMAL HIGH (ref 1.7–7.7)
Neutrophils Relative %: 64 %
Platelets: 264 10*3/uL (ref 150–400)
RBC: 4.26 MIL/uL (ref 3.87–5.11)
RDW: 12 % (ref 11.5–15.5)
WBC: 13.4 10*3/uL — ABNORMAL HIGH (ref 4.0–10.5)
nRBC: 0 % (ref 0.0–0.2)

## 2022-08-13 MED ORDER — ONDANSETRON 4 MG PO TBDP
4.0000 mg | ORAL_TABLET | Freq: Once | ORAL | Status: AC
  Administered 2022-08-13: 4 mg via ORAL
  Filled 2022-08-13: qty 1

## 2022-08-13 MED ORDER — MECLIZINE HCL 25 MG PO TABS
25.0000 mg | ORAL_TABLET | Freq: Once | ORAL | Status: AC
  Administered 2022-08-13: 25 mg via ORAL
  Filled 2022-08-13: qty 1

## 2022-08-13 NOTE — ED Provider Notes (Signed)
[facility] DEPARTMENT AT [facility] Provider Note   CSN: [id number] Arrival date & time: 08/13/22  2229     History  Chief Complaint  Patient presents with   Dizziness    Michelle [name] is a 32 y.o. female.  HPI     This is a 32 year old female who presents with dizziness.  Patient reports around 8 PM this evening she was sitting down when she began to have a sensation of feeling motion sickness.  She describes that as "feeling like I am moving but the room is not."  Denies room spinning dizziness.  Has never had anything like this before.  Denies vision changes, weakness, numbness, strokelike symptoms.  Recently diagnosed with a staph infection in her nose and was started on doxycycline yesterday.  She has never taken this medication before.  No history of anemia.  Home Medications Prior to Admission medications   Medication Sig Start Date End Date Taking? Authorizing Provider  sulfamethoxazole-trimethoprim (BACTRIM DS) 800-160 MG tablet Take 1 tablet by mouth 2 (two) times daily for 5 days. 08/14/22 08/19/22 Yes Bessie Boyte, Mayer Masker, MD  cetirizine (ZYRTEC) 10 MG tablet TAKE 2 TABLETS(20 MG) BY MOUTH TWICE DAILY 04/20/22   Ellamae Sia, DO  EPIPEN 2-PAK 0.3 MG/0.3ML SOAJ injection Inject 0.3 mg into the muscle once as needed for anaphylaxis. 07/24/22   Ellamae Sia, DO  famotidine (PEPCID) 20 MG tablet TAKE 1 TABLET(20 MG) BY MOUTH TWICE DAILY 04/20/22   Ellamae Sia, DO  metoCLOPramide (REGLAN) 10 MG tablet Take 1 tablet (10 mg total) by mouth every 6 (six) hours. Patient not taking: Reported on 08/13/2022 06/02/22   Jeanelle Malling, PA  ONDANSETRON PO Take by mouth.    [provider]  Polyethylene Glycol 3350 (MIRALAX PO) Take by mouth.    [provider]  propranolol (INDERAL) 20 MG tablet Take 1 tablet every night for 1 week, then increase to 1 tablet twice a day 04/25/22   Van Clines, MD  VENTOLIN HFA 108 [phone] Base) MCG/ACT inhaler INHALE 2 PUFFS INTO  THE LUNGS EVERY 4 HOURS AS NEEDED FOR WHEEZING OR SHORTNESS OF BREATH 07/05/22   Nehemiah Settle, FNP  fluticasone (FLONASE) 50 MCG/ACT nasal spray Place 1 spray into both nostrils daily. Patient not taking: No sig reported 01/06/19 08/06/20  Ellamae Sia, DO  nortriptyline (PAMELOR) 25 MG capsule Take 1 capsule (25 mg total) by mouth at bedtime. 04/09/20 08/06/20  Van Clines, MD      Allergies    Adhesive [tape], Bee venom, Reglan [metoclopramide], and Amoxil [amoxicillin]    Review of Systems   Review of Systems  Constitutional:  Negative for fever.  Respiratory:  Negative for shortness of breath.   Cardiovascular:  Negative for chest pain.  Neurological:  Positive for light-headedness. Negative for dizziness and speech difficulty.  All other systems reviewed and are negative.   Physical Exam Updated Vital Signs BP 124/76 (BP Location: Right Arm)   Pulse 94   Temp 97.9 F (36.6 C)   Resp 18   Ht 1.575 m (5\' 2" )   Wt 73.9 kg   LMP 08/14/2016 (Exact Date)   SpO2 100%   BMI 29.81 kg/m  Physical Exam Vitals and nursing note reviewed.  Constitutional:      Appearance: She is well-developed. She is not ill-appearing.  HENT:     Head: Normocephalic and atraumatic.     Nose:     Comments: Multiple piercings, pustule  noted in the right naris Eyes:     Pupils: Pupils are equal, round, and reactive to light.  Cardiovascular:     Rate and Rhythm: Normal rate and regular rhythm.     Heart sounds: Normal heart sounds.  Pulmonary:     Effort: Pulmonary effort is normal. No respiratory distress.     Breath sounds: No wheezing.  Abdominal:     Palpations: Abdomen is soft.     Tenderness: There is no abdominal tenderness.  Musculoskeletal:     Cervical back: Neck supple.  Skin:    General: Skin is warm and dry.  Neurological:     Mental Status: She is alert and oriented to person, place, and time.     Comments: Fluent speech, cranial nerves II through XII intact  Psychiatric:         Mood and Affect: Mood normal.     ED Results / Procedures / Treatments   Labs (all labs ordered are listed, but only abnormal results are displayed) Labs Reviewed  CBC WITH DIFFERENTIAL/PLATELET - Abnormal; Notable for the following components:      Result Value   WBC 13.4 (*)    Neutro Abs 8.7 (*)    All other components within normal limits  BASIC METABOLIC PANEL - Abnormal; Notable for the following components:   Potassium 3.4 (*)    Glucose, Bld 102 (*)    All other components within normal limits    EKG Normal sinus rhythm, Rate 83  Radiology No results found.  Procedures Procedures    Medications Ordered in ED Medications  ondansetron (ZOFRAN-ODT) disintegrating tablet 4 mg (4 mg Oral Given 08/13/22 2333)  meclizine (ANTIVERT) tablet 25 mg (25 mg Oral Given 08/13/22 2333)    ED Course/ Medical Decision Making/ A&P                             Medical Decision Making Amount and/or Complexity of Data Reviewed Labs: ordered.  Risk Prescription drug management.   This patient presents to the ED for concern of dizziness, this involves an extensive number of treatment options, and is a complaint that carries with it a high risk of complications and morbidity.  I considered the following differential and admission for this acute, potentially life threatening condition.  The differential diagnosis includes lightheadedness, orthostasis, vertigo, adverse medication effect, arrhythmia  MDM:    This is a 32 year old female who presents with dizziness.  She is overall nontoxic and vital signs are reassuring.  It is not classic vertigo.  She is neurologically intact.  Reports lightheadedness.  She has at this point taken 4 doses of doxycycline.  Lightheadedness is a known adverse effect of this medication.  EKG shows no evidence of arrhythmia and basic lab work including CBC without significant metabolic derangements or anemia.  Patient was given Zofran and meclizine.   She improved.  She is not orthostatic.  Suspect it may be related to the medication.  We discussed options including transitioning to topical Bactroban given that her nasal exam is only notable for pustule.  Otherwise could transition her to a medication like Bactrim.  Patient would like an oral antibiotic.  Will start on Bactrim.  She is not otherwise clinically ill-appearing.  (Labs, imaging, consults)  Labs: I Ordered, and personally interpreted labs.  The pertinent results include: CBC, BMP  Imaging Studies ordered: I ordered imaging studies including none I independently visualized and interpreted imaging. I  agree with the radiologist interpretation  Additional history obtained from significant other at bedside.  External records from outside source obtained and reviewed including prior evaluations  Cardiac Monitoring: The patient was maintained on a cardiac monitor.  I personally viewed and interpreted the cardiac monitored which showed an underlying rhythm of: Normal sinus rhythm  Reevaluation: After the interventions noted above, I reevaluated the patient and found that they have :improved  Social Determinants of Health:  lives independently  Disposition: Discharge  Co morbidities that complicate the patient evaluation  Past Medical History:  Diagnosis Date   Anxiety    Asthma    Bipolar disorder (HCC)    no current med.   Depression    no current med.   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    Dispense:  10 tablet    Refill:  0    I have reviewed the patients home medicines and have made adjustments as needed  Problem List / ED Course: Problem List Items Addressed This Visit   None Visit Diagnoses     Lightheadedness    -  Primary   Non-dose-related adverse effect of medication, initial encounter                       Final Clinical Impression(s) / ED Diagnoses Final diagnoses:  Lightheadedness  Non-dose-related adverse effect of medication, initial encounter    Rx / DC Orders ED Discharge Orders          Ordered    sulfamethoxazole-trimethoprim (BACTRIM DS) 800-160 MG tablet  2 times daily        08/14/22 0115              Shon Baton, MD 08/14/22 [phone]

## 2022-08-13 NOTE — ED Triage Notes (Signed)
Pt states that she was diagnosed with a "staph infection" and started on doxycycline yesterday. C/o of feeling "motion sickness" that started today. Worse with movement, but also feeling dizzy at rest. C/o feeling a slight headache. Has not been tested for staph, but states MD felt she had this. C/o nausea denies vomiting. Denies fevers.

## 2022-08-14 LAB — BASIC METABOLIC PANEL
Anion gap: 7 (ref 5–15)
BUN: 13 mg/dL (ref 6–20)
CO2: 27 mmol/L (ref 22–32)
Calcium: 9.1 mg/dL (ref 8.9–10.3)
Chloride: 104 mmol/L (ref 98–111)
Creatinine, Ser: 0.65 mg/dL (ref 0.44–1.00)
GFR, Estimated: >60 mL/min (ref >60)
Glucose, Bld: 102 mg/dL — ABNORMAL HIGH (ref 70–99)
Potassium: 3.4 mmol/L — ABNORMAL LOW (ref 3.5–5.1)
Sodium: 138 mmol/L (ref 135–145)

## 2022-08-14 MED ORDER — SULFAMETHOXAZOLE-TRIMETHOPRIM 800-160 MG PO TABS: 1.0000 | ORAL_TABLET | Freq: Two times a day (BID) | ORAL | 0 refills | Status: AC

## 2022-08-14 NOTE — Discharge Instructions (Signed)
You were seen today for lightheadedness.  This could be related to taking doxycycline.  Make sure that you are staying hydrated.  You will finish 5 days of Bactrim.  Stop taking the doxycycline.

## 2022-08-15 ENCOUNTER — Telehealth: Payer: Self-pay | Admitting: Family Medicine
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
## 2022-08-16 ENCOUNTER — Ambulatory Visit (INDEPENDENT_AMBULATORY_CARE_PROVIDER_SITE_OTHER): Payer: Medicaid Other | Admitting: Student

## 2022-08-16 VITALS — BP 110/70 | HR 87 | Ht 62.0 in | Wt 162.4 lb

## 2022-08-16 DIAGNOSIS — T300 Burn of unspecified body region, unspecified degree: Secondary | ICD-10-CM | POA: Diagnosis not present

## 2022-08-16 NOTE — Patient Instructions (Signed)
It was wonderful to see you today. Thank you for allowing me to be a part of your care. Below is a short summary of what we discussed at your visit today:  I recommend you continue the use of bone gel and Vaseline over the affected area.  For pain you can do Tylenol or ibuprofen.  If you developed significant swelling with numbness or tingling sensations in your hands please call back to be see  Please bring all of your medications to every appointment!  If you have any questions or concerns, please do not hesitate to contact us via phone or MyChart message.   Alen Bleacher, MD [facility]
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
## 2022-08-23 ENCOUNTER — Telehealth: Payer: Self-pay

## 2022-08-23 ENCOUNTER — Ambulatory Visit (INDEPENDENT_AMBULATORY_CARE_PROVIDER_SITE_OTHER): Payer: Medicaid Other

## 2022-08-23 DIAGNOSIS — G43009 Migraine without aura, not intractable, without status migrainosus: Secondary | ICD-10-CM

## 2022-08-23 MED ORDER — KETOROLAC TROMETHAMINE 60 MG/2ML IM SOLN
60.0000 mg | Freq: Once | INTRAMUSCULAR | Status: AC
  Administered 2022-08-23: 60 mg via INTRAMUSCULAR

## 2022-08-23 MED ORDER — METOCLOPRAMIDE HCL 5 MG/ML IJ SOLN
10.0000 mg | Freq: Once | INTRAMUSCULAR | Status: AC
  Administered 2022-08-23: 10 mg via INTRAMUSCULAR

## 2022-08-23 MED ORDER — DIPHENHYDRAMINE HCL 50 MG/ML IJ SOLN
50.0000 mg | Freq: Once | INTRAMUSCULAR | Status: AC
  Administered 2022-08-23: 25 mg via INTRAMUSCULAR

## 2022-08-23 NOTE — Telephone Encounter (Signed)
Pt called informed that Dr Delice Lesch stated she can have the headache cocktail. Pt has not started the propranolol '20mg'$  BID she stated she want to talk to Dr Delice Lesch more about the medication. She stated that before when starting mediation she has had a reaction she is worried about taken it,

## 2022-08-23 NOTE — Telephone Encounter (Signed)
Noted. Proceed with migraine cocktail, will discuss concerns on her f/u next week. Thanks

## 2022-08-23 NOTE — Telephone Encounter (Signed)
Patient is calling in asking to come in for migraine cocktail. Okay to put on schedule?

## 2022-08-23 NOTE — Telephone Encounter (Signed)
Ok for cocktail, but it looks like she had one 2 weeks ago in the ER? Sounds like migraines are more frequent. Has she been able to tolerate the Propranolol '20mg'$  BID? If yes, I would like to increase dose to help cut down on frequency of migraines

## 2022-08-24 ENCOUNTER — Ambulatory Visit (HOSPITAL_COMMUNITY)
Admission: EM | Admit: 2022-08-24 | Discharge: 2022-08-24 | Disposition: A | Discharge status: Home / Self Care | Payer: Medicaid Other | Attending: Internal Medicine | Admitting: Internal Medicine

## 2022-08-24 ENCOUNTER — Encounter (HOSPITAL_COMMUNITY): Payer: Self-pay

## 2022-08-24 DIAGNOSIS — S43402A Unspecified sprain of left shoulder joint, initial encounter: Secondary | ICD-10-CM

## 2022-08-24 DIAGNOSIS — S46911A Strain of unspecified muscle, fascia and tendon at shoulder and upper arm level, right arm, initial encounter: Secondary | ICD-10-CM | POA: Diagnosis not present

## 2022-08-24 MED ORDER — METHOCARBAMOL 500 MG PO TABS: 500.0000 mg | ORAL_TABLET | Freq: Two times a day (BID) | ORAL | 0 refills | Status: DC

## 2022-08-24 NOTE — ED Triage Notes (Signed)
Chief Complaint: pain in the right Coller bone to the neck when turning her head. Pain is sharp. No known injuries, falls, or trauma. History of connective tissue disorder.   Onset: 0630 today   Prescriptions or OTC medications tried: No

## 2022-08-24 NOTE — Discharge Instructions (Addendum)
Rest affected painful area.   Heating pad use only 20 minutes on-20 minutes off cycle As pain recedes, begin normal activities slowly as tolerated.  Please do not drive or operate heavy machinery after taking muscle relaxants because they make you drowsy. Please return to the urgent care if symptoms persist There is no indication for x-rays at this time.

## 2022-08-25 NOTE — ED Provider Notes (Signed)
[place]    CSN: [id number] Arrival date & time: 08/24/22  1845      History   Chief Complaint Chief Complaint  Patient presents with   Shoulder Pain   Neck Pain    HPI Michelle [name] is a 32 y.o. female comes to the urgent care with right shoulder pain and right sided neck pain which started this morning.  Patient describes the pain as sharp, aggravated by movement of the right upper extremity and turning her head to the right.  No known relieving factors.  No weakness in the upper extremities.  No fever or chills.  No trauma to the neck.  No dizziness, near syncope or syncopal episodes.  Pain does not radiate into the arms.  No bruising. HPI  Past Medical History:  Diagnosis Date   Anxiety    Asthma    Bipolar disorder ([place])    no current med.   Depression    no current med.   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   Migraine    Nephrolithiasis    PTSD (post-traumatic stress disorder)    PTSD (post-traumatic stress disorder)    Pyelonephritis 01/16/2019   Seizures ([place])    Tachycardia    TMJ (dislocation of temporomandibular joint)    Vertigo     Patient Active Problem List   Diagnosis Date Noted   Injection site reaction 08/09/2022   Penicillin allergy 03/22/2022   Sinus infection 01/02/2022   Epiploic appendagitis 10/18/2021   Sacroiliac joint dysfunction of right side 10/17/2021   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   Seborrheic dermatitis 09/22/2021   Other fatigue 09/11/2021   Hair loss 09/11/2021   Constipation 08/30/2021   Dizziness 02/10/2021   Hypermobility syndrome 10/04/2020   Encounter  for pre-operative examination 08/17/2020   Nipple discharge in female 06/04/2020   GERD (gastroesophageal reflux disease) 06/04/2020   Labral tear of right hip joint 04/12/2020   Right hip pain 02/03/2020   Finger pain, right 10/23/2019   Eustachian tube dysfunction, bilateral 12/16/2018   Bee sting allergy 11/06/2018   Allergic reaction 11/04/2018   Mild intermittent asthma without complication [id number]   Chronic rhinitis 11/04/2018   Chronic urticaria 10/04/2018   Rash 11/16/2017   Easy bruising 11/16/2017   Anxiety 10/27/2016   SUI (stress urinary incontinence, female) 04/10/2016   Migraine 02/10/2016   Lipoma 08/23/2015   PTSD 03/02/2010   Tobacco use disorder 02/07/2008   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    Past Surgical History:  Procedure Laterality Date   ABDOMINAL HYSTERECTOMY     ADENOIDECTOMY, TONSILLECTOMY AND MYRINGOTOMY WITH TUBE PLACEMENT  1990's   APPENDECTOMY     CESAREAN SECTION  06/20/2012   Procedure: CESAREAN SECTION;  Surgeon: Emily Filbert, MD;  Location: [facility];  Service: Obstetrics;  Laterality: N/A;   HIP SURGERY     LAPAROSCOPIC APPENDECTOMY  07/25/2012   Procedure: APPENDECTOMY LAPAROSCOPIC;  Surgeon: Zenovia Jarred, MD;  Location: [facility];  Service: General;  Laterality: N/A;   LAPAROSCOPIC TUBAL LIGATION Bilateral 08/04/2014   Procedure: BILATERAL LAPAROSCOPIC  TUBAL LIGATION;  Surgeon: Woodroe Mode, MD;  Location: [facility];  Service: Gynecology;  Laterality: Bilateral;   LIPOMA EXCISION N/A 09/16/2015   Procedure: EXCISION LIPOMA LUMBAR REGION;  Surgeon: Donnie Mesa, MD;  Location: [facility];  Service: General;  Laterality: N/A;   TONSILLECTOMY     WISDOM TOOTH EXTRACTION      OB History     Gravida  3   Para  3   Term  2   Preterm  1   AB  0   Living  3      SAB  0   IAB  0   Ectopic  0   Multiple  0   Live Births  1        Obstetric Comments  C-Section Indication: Non-reassuring  fetal tracing, growth delay & marked proteinuria & Pre-Eclampsia Eclamptic Seizure during C-section delivery          Home Medications    Prior to Admission medications   Medication Sig Start Date End Date Taking? Authorizing Provider  cetirizine (ZYRTEC) 10 MG tablet TAKE 2 TABLETS(20 MG) BY MOUTH TWICE DAILY 04/20/22  Yes Garnet Sierras, DO  famotidine (PEPCID) 20 MG tablet TAKE 1 TABLET(20 MG) BY MOUTH TWICE DAILY 04/20/22  Yes Garnet Sierras, DO  methocarbamol (ROBAXIN) 500 MG tablet Take 1 tablet (500 mg total) by mouth 2 (two) times daily. 08/24/22  Yes Jenea Dake, Myrene Galas, MD  EPIPEN 2-PAK 0.3 MG/0.3ML SOAJ injection Inject 0.3 mg into the muscle once as needed for anaphylaxis. 07/24/22   Garnet Sierras, DO  propranolol (INDERAL) 20 MG tablet Take 1 tablet every night for 1 week, then increase to 1 tablet twice a day 04/25/22   Cameron Sprang, MD  VENTOLIN HFA 108 [phone] Base) MCG/ACT inhaler INHALE 2 PUFFS INTO THE LUNGS EVERY 4 HOURS AS NEEDED FOR WHEEZING OR SHORTNESS OF BREATH 07/05/22   Althea Charon, FNP  fluticasone (FLONASE) 50 MCG/ACT nasal spray Place 1 spray into both nostrils daily. Patient not taking: No sig reported 01/06/19 08/06/20  Garnet Sierras, DO  nortriptyline (PAMELOR) 25 MG capsule Take 1 capsule (25 mg total) by mouth at bedtime. 04/09/20 08/06/20  Cameron Sprang, MD    Family History Family History  Problem Relation Age of Onset   Diabetes Mother    Anesthesia problems Mother        hard to wake up post-op   Atrial fibrillation Mother    Congestive Heart Failure Mother    Diabetes Father    Heart disease Father    Diabetes Sister    Anesthesia problems Sister        hard to wake up post-op   Colon cancer Maternal Grandfather    Breast cancer Paternal Grandmother    Colon cancer Paternal Grandfather    Colon polyps Maternal Aunt    Eczema Neg Hx    Allergic rhinitis Neg Hx    Asthma Neg Hx     Social History Social History   Tobacco Use   Smoking status:  Former    Packs/day: 0.00    Years: 14.00    Total pack years: 0.00    Types: Cigarettes    Quit date: 03/17/2017    Years since quitting: 5.4    Passive exposure: Never   Smokeless tobacco: Never  Vaping Use   Vaping Use: Some days  Substance Use Topics   Alcohol use: No   Drug use: No  Allergies   Adhesive [tape], Bee venom, Reglan [metoclopramide], and Amoxil [amoxicillin]   Review of Systems Review of Systems As per HPI  Physical Exam Triage Vital Signs ED Triage Vitals  Enc Vitals Group     BP 08/24/22 2011 124/84     Pulse Rate 08/24/22 2011 (!) 102     Resp 08/24/22 2011 16     Temp 08/24/22 2011 98.3 F (36.8 C)     Temp Source 08/24/22 2011 Oral     SpO2 08/24/22 2011 97 %     Weight 08/24/22 2011 160 lb (72.6 kg)     Height 08/24/22 2011 5' 2"$  (1.575 m)     Head Circumference --      Peak Flow --      Pain Score 08/24/22 2010 4     Pain Loc --      Pain Edu? --      Excl. in [place]? --    No data found.  Updated Vital Signs BP 124/84 (BP Location: Left Arm)   Pulse (!) 102   Temp 98.3 F (36.8 C) (Oral)   Resp 16   Ht 5' 2"$  (1.575 m)   Wt 72.6 kg   LMP 08/14/2016 (Exact Date)   SpO2 97%   BMI 29.26 kg/m   Visual Acuity Right Eye Distance:   Left Eye Distance:   Bilateral Distance:    Right Eye Near:   Left Eye Near:    Bilateral Near:     Physical Exam Vitals reviewed.  Constitutional:      General: She is not in acute distress.    Appearance: Normal appearance. She is not ill-appearing.  HENT:     Right Ear: Tympanic membrane normal.     Left Ear: Tympanic membrane normal.  Musculoskeletal:     Comments: Tenderness on palpation over the right side of the neck.  Patient's neck is supple.  Power is 5/5 in both upper extremities.  Patient has tenderness on palpation of the left shoulder.  Neurological:     Mental Status: She is alert.  Psychiatric:     Comments: Flat affect      UC Treatments / Results  Labs (all labs  ordered are listed, but only abnormal results are displayed) Labs Reviewed - No data to display  EKG   Radiology No results found.  Procedures Procedures (including critical care time)  Medications Ordered in UC Medications - No data to display  Initial Impression / Assessment and Plan / UC Course  I have reviewed the triage vital signs and the nursing notes.  Pertinent labs & imaging results that were available during my care of the patient were reviewed by me and considered in my medical decision making (see chart for details).     1.  Sprain of the right shoulder: Robaxin as needed for muscle stiffness and/or spasms Ibuprofen as needed for pain Gentle range of motion exercises Heating pad use will help with symptoms Return to urgent care if symptoms worsen. No indication for imaging at this time. Final Clinical Impressions(s) / UC Diagnoses   Final diagnoses:  Sprain of left shoulder, unspecified shoulder sprain type, initial encounter     Discharge Instructions      Rest affected painful area.   Heating pad use only 20 minutes on-20 minutes off cycle As pain recedes, begin normal activities slowly as tolerated.  Please do not drive or operate heavy machinery after taking muscle relaxants because they make you drowsy. Please  return to the urgent care if symptoms persist There is no indication for x-rays at this time.   ED Prescriptions     Medication Sig Dispense Auth. Provider   methocarbamol (ROBAXIN) 500 MG tablet Take 1 tablet (500 mg total) by mouth 2 (two) times daily. 20 tablet Andree Golphin, Myrene Galas, MD      PDMP not reviewed this encounter.   Chase Picket, MD 08/25/22 [phone]

## 2022-08-28 ENCOUNTER — Ambulatory Visit: Payer: Medicaid Other | Admitting: Neurology

## 2022-09-26 ENCOUNTER — Ambulatory Visit (INDEPENDENT_AMBULATORY_CARE_PROVIDER_SITE_OTHER): Payer: Medicaid Other | Admitting: Neurology

## 2022-09-26 ENCOUNTER — Ambulatory Visit: Payer: Medicaid Other | Admitting: Neurology

## 2022-09-26 ENCOUNTER — Encounter: Payer: Self-pay | Admitting: Neurology

## 2022-09-26 VITALS — BP 133/83 | HR 98 | Ht 62.0 in | Wt 167.0 lb

## 2022-09-26 DIAGNOSIS — G43009 Migraine without aura, not intractable, without status migrainosus: Secondary | ICD-10-CM | POA: Diagnosis not present

## 2022-09-26 NOTE — Patient Instructions (Addendum)
Good to see you.  You can try Magnesium oxide '400mg'$  daily and Riboflavin (vitamin b2) '400mg'$  daily to help with migraine prevention  2. Continue working on improved sleep, exercise, and diet  3. Follow-up in 6 months,call for any changes

## 2022-09-26 NOTE — Progress Notes (Signed)
NEUROLOGY FOLLOW UP OFFICE NOTE  Michelle [name] [id number] Aug 02, 1990  HISTORY OF PRESENT ILLNESS: I had the pleasure of seeing Michelle [name] in follow-up in the neurology clinic on 09/26/2022.  The patient was last seen 5 months ago for migraines. She is alone in the office today. Records and images were personally reviewed where available.  She had a good response to nortriptyline and was able to wean off medication, however on last visit reported migraine recurrence. She was concerned nortriptyline caused tachycardia and opted to try a different preventative. We discussed starting Propranolol, however after she had an allergic reaction to Reglan, she became hesitant to start any new medications. After 2 doses of Reglan, she had depression, body shakiness, could not stop crying. She reports that migraines appear to have quieted down, last migraine was on 08/23/22 when she received a migraine cocktail. She has started improving her diet and exercise, which did help with migraines in the past. She gets 8 hours of sleep but wakes up a couple of times to urinate. No daytime drowsiness. She denies any dizziness, vision changes, focal numbness/tingling/weakness, no falls. 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    History on Initial Assessment 03/06/2016: This is a 32yo RH woman with a history of bipolar disorder, PTSD, hypertension, who presented with new onset headaches since June 2016. 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 They were so bad that she was shaking on the floor and felt like she would pass out. She went to the ER on 01/06/16 and reports having daily headaches that wax and wane in intensity since then. She would wake up with a headache then towards  the evening they become more severe 10/10 with throbbing pain and nausea. She occasionally sees black dots in her vision. No positional component. She stopped Depo-Provera last week. No photo/phonophobia, tinnitus, focal numbness/tingling/weakness, dizziness, diplopia, dysarthria, dysphagia, bowel/bladder dysfunction. She has had neck pains as well. She has chronic back pain.    She has been dealing with significant stomach cramps and will be seeing a specialist regarding concern for endometriosis. She has been taking Tramadol and Percocet daily for the cramps, and has prn Fioricet for the headaches, taking 2-3 daily for the past 2 months. She gets 8-9 hours of sleep but still feels exhausted (for the past couple of years). She denies snoring, she has daytime drowsiness. She was given Reglan as well in the ER and takes this daily. There is a history of one seizure during delivery of her third child in 2013. No family history of migraines.   Prior rescue: sumatriptan  Prior preventative   PAST MEDICAL HISTORY: Past Medical History:  Diagnosis Date   Anxiety    Asthma    Bipolar disorder ([place])    no current med.   Depression    no current med.   Dermatitis 01/16/2019   Family history of adverse reaction to anesthesia    states mother and sister are hard to wake up post-op   Gestational diabetes    History of seizure 06/20/2012   x 1 - during delivery of child(eclampsia)   Hypermobile Ehlers-Danlos syndrome    Hypertension    Idiopathic urticaria    Lipoma of lower back 08/2015   Medullary sponge kidney  of both kidneys 12/2019   Migraine    Nephrolithiasis    PTSD (post-traumatic stress disorder)    PTSD (post-traumatic stress disorder)    Pyelonephritis 01/16/2019   Seizures ([place])    Tachycardia    TMJ (dislocation of temporomandibular joint)    Vertigo     MEDICATIONS: Current Outpatient Medications on File Prior to Visit  Medication Sig Dispense Refill   cetirizine (ZYRTEC)  10 MG tablet TAKE 2 TABLETS(20 MG) BY MOUTH TWICE DAILY 120 tablet 5   EPIPEN 2-PAK 0.3 MG/0.3ML SOAJ injection Inject 0.3 mg into the muscle once as needed for anaphylaxis. 2 each 1   famotidine (PEPCID) 20 MG tablet TAKE 1 TABLET(20 MG) BY MOUTH TWICE DAILY 60 tablet 5   VENTOLIN HFA 108 (90 Base) MCG/ACT inhaler INHALE 2 PUFFS INTO THE LUNGS EVERY 4 HOURS AS NEEDED FOR WHEEZING OR SHORTNESS OF BREATH 18 g 1   [DISCONTINUED] fluticasone (FLONASE) 50 MCG/ACT nasal spray Place 1 spray into both nostrils daily. (Patient not taking: No sig reported) 15.8 mL 5   [DISCONTINUED] nortriptyline (PAMELOR) 25 MG capsule Take 1 capsule (25 mg total) by mouth at bedtime. 30 capsule 6   No current facility-administered medications on file prior to visit.    ALLERGIES: Allergies  Allergen Reactions   Adhesive [Tape] Hives and Other (See Comments)    EKG or heart monitor pads- caused raised, red areas (WELTS) on the skin; "sensitive" pads fell off   Bee Venom Hives and Other (See Comments)    Welts, also   Reglan [Metoclopramide]     "Bad side effects: severe depression, anxiety, body tremors"   Amoxil [Amoxicillin] Rash    FAMILY HISTORY: Family History  Problem Relation Age of Onset   Diabetes Mother    Anesthesia problems Mother        hard to wake up post-op   Atrial fibrillation Mother    Congestive Heart Failure Mother    Diabetes Father    Heart disease Father    Diabetes Sister    Anesthesia problems Sister        hard to wake up post-op   Colon cancer Maternal Grandfather    Breast cancer Paternal Grandmother    Colon cancer Paternal Grandfather    Colon polyps Maternal Aunt    Eczema Neg Hx    Allergic rhinitis Neg Hx    Asthma Neg Hx     SOCIAL HISTORY: Social History   Socioeconomic History   Marital status: Single    Spouse name: Not on file   Number of children: 2   Years of education: Not on file   Highest education level: Not on file  Occupational History   Not  on file  Tobacco Use   Smoking status: Former    Packs/day: 0.00    Years: 14.00    Total pack years: 0.00    Types: Cigarettes    Quit date: 03/17/2017    Years since quitting: 5.5    Passive exposure: Never   Smokeless tobacco: Never  Vaping Use   Vaping Use: Some days  Substance and Sexual Activity   Alcohol use: No   Drug use: No   Sexual activity: Yes    Birth control/protection: Surgical  Other Topics Concern   Not on file  Social History Narrative   Lives with boyfriend and 3 children in a one story home.  Does not work.  Education: 9th grade. Right handed    Social Determinants of Health  Financial Resource Strain: Not on file  Food Insecurity: Not on file  Transportation Needs: No Transportation Needs (06/12/2022)   PRAPARE - Hydrologist (Medical): No    Lack of Transportation (Non-Medical): No  Physical Activity: Not on file  Stress: Not on file  Social Connections: Not on file  Intimate Partner Violence: Not on file     PHYSICAL EXAM: Vitals:   09/26/22 1444  BP: 133/83  Pulse: 98  SpO2: 98%   General: No acute distress Head:  Normocephalic/atraumatic Skin/Extremities: No rash, no edema Neurological Exam: alert and awake. No aphasia or dysarthria. Fund of knowledge is appropriate.  Attention and concentration are normal.   Cranial nerves: Pupils equal, round. Extraocular movements intact with no nystagmus. Visual fields full.  No facial asymmetry.  Motor: Bulk and tone normal, muscle strength 5/5 throughout with no pronator drift.   Finger to nose testing intact.  Gait narrow-based and steady, able to tandem walk adequately.  Romberg negative.   IMPRESSION: This is a 32 yo RH woman with a history of bipolar disorder, PTSD, hypertension, s/p hysterectomy, EDS, who presented in 2017 for new onset daily headaches with migranous features. MRI brain normal. She had a good response to nortriptyline and was able to wean off  medication, however had migraine recurrence a year ago. She did not want to restart nortriptyline due to concern for tachycardia, avoiding Topiramate/Zonisamide due to nephrolithiasis. She had an allergic reaction to Reglan and has been hesitant to start any new medications, including Propranolol prescribed on last visit. Her last migraine was a month ago, we discussed option for rescue medications but she is hesitant about trying a new medication at this time. She reports that if migraines last 3 days, she will call our office for a migraine cocktail. Continue to monitor, she will try magnesium and riboflavin, continue working on exercise, sleep hygiene, and diet. Follow-up in 6 months, call for any changes.   Thank you for allowing me to participate in her care.  Please do not hesitate to call for any questions or concerns.    Ellouise Newer, M.D.   CC: Dr. Owens Shark

## 2022-09-28 DIAGNOSIS — J309 Allergic rhinitis, unspecified: Secondary | ICD-10-CM | POA: Diagnosis not present

## 2022-09-28 DIAGNOSIS — Z0000 Encounter for general adult medical examination without abnormal findings: Secondary | ICD-10-CM | POA: Diagnosis not present

## 2022-09-28 DIAGNOSIS — G43709 Chronic migraine without aura, not intractable, without status migrainosus: Secondary | ICD-10-CM | POA: Diagnosis not present

## 2022-09-28 DIAGNOSIS — D8944 Hereditary alpha tryptasemia: Secondary | ICD-10-CM | POA: Diagnosis not present

## 2022-09-28 DIAGNOSIS — L293 Anogenital pruritus, unspecified: Secondary | ICD-10-CM | POA: Diagnosis not present

## 2022-10-02 DIAGNOSIS — J302 Other seasonal allergic rhinitis: Secondary | ICD-10-CM | POA: Diagnosis not present

## 2022-10-05 DIAGNOSIS — Z124 Encounter for screening for malignant neoplasm of cervix: Secondary | ICD-10-CM | POA: Diagnosis not present

## 2022-10-19 ENCOUNTER — Emergency Department (HOSPITAL_BASED_OUTPATIENT_CLINIC_OR_DEPARTMENT_OTHER)
Admission: EM | Admit: 2022-10-19 | Discharge: 2022-10-19 | Disposition: A | Discharge status: Home / Self Care | Payer: Medicaid Other | Attending: Emergency Medicine | Admitting: Emergency Medicine

## 2022-10-19 ENCOUNTER — Encounter (HOSPITAL_BASED_OUTPATIENT_CLINIC_OR_DEPARTMENT_OTHER): Payer: Self-pay

## 2022-10-19 ENCOUNTER — Other Ambulatory Visit: Payer: Self-pay

## 2022-10-19 ENCOUNTER — Emergency Department (HOSPITAL_BASED_OUTPATIENT_CLINIC_OR_DEPARTMENT_OTHER): Payer: Medicaid Other | Admitting: Radiology

## 2022-10-19 DIAGNOSIS — M545 Low back pain, unspecified: Secondary | ICD-10-CM | POA: Diagnosis not present

## 2022-10-19 DIAGNOSIS — M542 Cervicalgia: Secondary | ICD-10-CM | POA: Diagnosis not present

## 2022-10-19 DIAGNOSIS — Z87891 Personal history of nicotine dependence: Secondary | ICD-10-CM | POA: Diagnosis not present

## 2022-10-19 LAB — PREGNANCY, URINE: Preg Test, Ur: NEGATIVE

## 2022-10-19 MED ORDER — LIDOCAINE 5 % EX PTCH
1.0000 | MEDICATED_PATCH | CUTANEOUS | Status: DC
  Administered 2022-10-19: 1 via TRANSDERMAL
  Filled 2022-10-19: qty 1

## 2022-10-19 MED ORDER — IBUPROFEN 400 MG PO TABS
600.0000 mg | ORAL_TABLET | Freq: Once | ORAL | Status: AC
  Administered 2022-10-19: 600 mg via ORAL
  Filled 2022-10-19: qty 1

## 2022-10-19 NOTE — Discharge Instructions (Signed)
Your x-rays today were normal.  Take Tylenol Motrin as needed for pain.  Return to the emergency department for any emergent condition.

## 2022-10-19 NOTE — ED Notes (Signed)
Discharge paperwork given and verbally understood. 

## 2022-10-19 NOTE — ED Triage Notes (Signed)
Patient here POV from Home.  Endorses tripping going up the steps when she was walking up the steps while carrying a Case of Water.  No Fall. Now endorses Midline Neck pain and Lower Back.   NAD Noted during Triage. A&Ox4. GCS 15. Ambulatory.

## 2022-10-19 NOTE — ED Provider Notes (Signed)
[facility] Provider Note   CSN: [id number] Arrival date & time: 10/19/22  1850     History  Chief Complaint  Patient presents with   Neck Pain    ZABRINA SPOHN is a 32 y.o. female.   Neck Pain    Patient with history of bipolar, tobacco abuse, anxiety presents to the emergency department due to neck pain and low back pain.  It started today while she was carrying heavy water bottles upstairs.  She felt she twisted, did not actually fall or make contact with the ground.  Since then she has have been having pain to the middle of her cervical spine and lumbar spine.  Denies any loss of bladder or bowel function, no saddle anesthesia, lower extremity weakness.  She does not have any history of radiation, she is not on any steroids or immunomodulating medicines.   Home Medications Prior to Admission medications   Medication Sig Start Date End Date Taking? Authorizing Provider  cetirizine (ZYRTEC) 10 MG tablet TAKE 2 TABLETS(20 MG) BY MOUTH TWICE DAILY 04/20/22   Garnet Sierras, DO  EPIPEN 2-PAK 0.3 MG/0.3ML SOAJ injection Inject 0.3 mg into the muscle once as needed for anaphylaxis. 07/24/22   Garnet Sierras, DO  famotidine (PEPCID) 20 MG tablet TAKE 1 TABLET(20 MG) BY MOUTH TWICE DAILY 04/20/22   Garnet Sierras, DO  VENTOLIN HFA 108 (90 Base) MCG/ACT inhaler INHALE 2 PUFFS INTO THE LUNGS EVERY 4 HOURS AS NEEDED FOR WHEEZING OR SHORTNESS OF BREATH 07/05/22   Althea Charon, FNP  fluticasone (FLONASE) 50 MCG/ACT nasal spray Place 1 spray into both nostrils daily. Patient not taking: No sig reported 01/06/19 08/06/20  Garnet Sierras, DO  nortriptyline (PAMELOR) 25 MG capsule Take 1 capsule (25 mg total) by mouth at bedtime. 04/09/20 08/06/20  Cameron Sprang, MD      Allergies    Adhesive [tape], Bee venom, Reglan [metoclopramide], and Amoxil [amoxicillin]    Review of Systems   Review of Systems  Musculoskeletal:  Positive for neck pain.    Physical  Exam Updated Vital Signs BP 108/79 (BP Location: Right Arm)   Pulse 82   Temp (!) 96.7 F (35.9 C) (Temporal)   Resp 16   Ht 5\' 2"  (1.575 m)   Wt 75.8 kg   LMP 08/14/2016 (Exact Date)   SpO2 100%   BMI 30.56 kg/m  Physical Exam Vitals and nursing note reviewed. Exam conducted with a chaperone present.  Constitutional:      Appearance: Normal appearance.  HENT:     Head: Normocephalic and atraumatic.  Eyes:     General: No scleral icterus.       Right eye: No discharge.        Left eye: No discharge.     Extraocular Movements: Extraocular movements intact.     Pupils: Pupils are equal, round, and reactive to light.  Cardiovascular:     Rate and Rhythm: Normal rate and regular rhythm.     Pulses: Normal pulses.     Heart sounds: Normal heart sounds. No murmur heard.    No friction rub. No gallop.  Pulmonary:     Effort: Pulmonary effort is normal. No respiratory distress.     Breath sounds: Normal breath sounds.  Abdominal:     General: Abdomen is flat. Bowel sounds are normal. There is no distension.     Palpations: Abdomen is soft.     Tenderness: There is  no abdominal tenderness.  Musculoskeletal:        General: Tenderness present.     Cervical back: Tenderness present.     Comments: Diffuse lumbar ttp, mild line.   Skin:    General: Skin is warm and dry.     Coloration: Skin is not jaundiced.  Neurological:     Mental Status: She is alert. Mental status is at baseline.     Coordination: Coordination normal.     Comments: CN II-XII grossly intact right upper and lower extremity stance symmetric bilaterally.     ED Results / Procedures / Treatments   Labs (all labs ordered are listed, but only abnormal results are displayed) Labs Reviewed  PREGNANCY, URINE    EKG None  Radiology DG Lumbar Spine Complete  Result Date: 10/19/2022 CLINICAL DATA:  Pain EXAM: LUMBAR SPINE - COMPLETE 4 VIEW COMPARISON:  None Available. FINDINGS: Six lumbar type vertebral  bodies. There is no evidence of lumbar spine fracture. Alignment is normal. Intervertebral disc spaces are maintained. IMPRESSION: No radiographic evidence of traumatic injury. Electronically Signed   By: Lorenza Cambridge M.D.   On: 10/19/2022 19:53   DG Cervical Spine Complete  Result Date: 10/19/2022 CLINICAL DATA:  Pain EXAM: CERVICAL SPINE - COMPLETE 4+ VIEW COMPARISON:  None Available. FINDINGS: Straightening of the normal cervical lordosis. There is no evidence of cervical spine fracture or prevertebral soft tissue swelling. Alignment is normal. No other significant bone abnormalities are identified. IMPRESSION: Negative cervical spine radiographs. Electronically Signed   By: Lorenza Cambridge M.D.   On: 10/19/2022 19:52    Procedures Procedures    Medications Ordered in ED Medications  lidocaine (LIDODERM) 5 % 1 patch (1 patch Transdermal Patch Applied 10/19/22 1934)  ibuprofen (ADVIL) tablet 600 mg (600 mg Oral Given 10/19/22 1932)    ED Course/ Medical Decision Making/ A&P                             Medical Decision Making Amount and/or Complexity of Data Reviewed Labs: ordered. Radiology: ordered.  Risk Prescription drug management.   Patient presents due to atraumatic neck and back pain.  Physical exam seems most supportive of muscle strain.  There is no indication of cauda equina, no red flag symptoms and no trauma so I again do not really suspect a compression fracture.  She is afebrile, no risk factors for epidural abscess.  Plan films were ordered, negative for any acute process.  Patient advised to take yet supportive medication and follow-up with PCP.  Stable for discharge.        Final Clinical Impression(s) / ED Diagnoses Final diagnoses:  Neck pain    Rx / DC Orders ED Discharge Orders     None         Theron Arista, Cordelia Poche 10/19/22 2152    Linwood Dibbles, MD 10/20/22 1537

## 2022-10-30 ENCOUNTER — Encounter: Payer: Self-pay | Admitting: *Deleted

## 2022-10-31 ENCOUNTER — Encounter (HOSPITAL_BASED_OUTPATIENT_CLINIC_OR_DEPARTMENT_OTHER): Payer: Self-pay | Admitting: Emergency Medicine

## 2022-10-31 ENCOUNTER — Emergency Department (HOSPITAL_BASED_OUTPATIENT_CLINIC_OR_DEPARTMENT_OTHER)
Admission: EM | Admit: 2022-10-31 | Discharge: 2022-10-31 | Disposition: A | Discharge status: Home / Self Care | Payer: Medicaid Other | Attending: Emergency Medicine | Admitting: Emergency Medicine

## 2022-10-31 ENCOUNTER — Other Ambulatory Visit: Payer: Self-pay

## 2022-10-31 DIAGNOSIS — I10 Essential (primary) hypertension: Secondary | ICD-10-CM | POA: Diagnosis not present

## 2022-10-31 DIAGNOSIS — W57XXXA Bitten or stung by nonvenomous insect and other nonvenomous arthropods, initial encounter: Secondary | ICD-10-CM | POA: Diagnosis not present

## 2022-10-31 DIAGNOSIS — S60461A Insect bite (nonvenomous) of left index finger, initial encounter: Secondary | ICD-10-CM | POA: Insufficient documentation

## 2022-10-31 DIAGNOSIS — J3489 Other specified disorders of nose and nasal sinuses: Secondary | ICD-10-CM | POA: Diagnosis not present

## 2022-10-31 DIAGNOSIS — S61251A Open bite of left index finger without damage to nail, initial encounter: Secondary | ICD-10-CM | POA: Diagnosis not present

## 2022-10-31 MED ORDER — TRIAMCINOLONE ACETONIDE 0.1 % EX CREA: 1.0000 | TOPICAL_CREAM | Freq: Two times a day (BID) | CUTANEOUS | 0 refills | Status: AC

## 2022-10-31 NOTE — Discharge Instructions (Signed)
As discussed, we will treat affected area with steroid cream to apply twice daily for the next 7 days.  Recommend continue use dose of Zyrtec at home as needed for feelings of itchiness.  Look for signs of infection as we discussed.  Please do not hesitate to return to emergency department for worrisome signs and symptoms we discussed become apparent.

## 2022-10-31 NOTE — ED Notes (Signed)
Discharge instructions, follow up care, and prescriptions reviewed and explained, pt verbalized understanding.  

## 2022-10-31 NOTE — ED Provider Notes (Signed)
[place] [facility] AT [facility] Provider Note   CSN: [id number] Arrival date & time: 10/31/22  1719     History  Chief Complaint  Patient presents with   Insect Bite    Michelle [name] is a 32 y.o. female.  HPI   32 year old female presents emergency department with complaints of insect bite on right hand.  Patient states that she noted area of pain/itchiness around 10 AM this morning.  Reports persistence of symptoms prompting visit to the emergency department today.  Denies any known bite.  Never saw insect.  Denies any exposure to known allergens/changes in routine self-care..  States area has been itchy as well as slightly painful with touching.  Has tried no medications for this.  Denies fever, chills, chest pain, shortness of breath, abdominal pain, nausea, vomiting   Past medical history significant for Ehlers-Danlos syndrome, hypertension, PTSD, TMJ, idiopathic urticaria, bipolar disorder, lipoma of lower back, medullary sponge kidney  Home Medications Prior to Admission medications   Medication Sig Start Date End Date Taking? Authorizing Provider  triamcinolone cream (KENALOG) 0.1 % Apply 1 Application topically 2 (two) times daily for 7 days. 10/31/22 11/07/22 Yes Sherian Maroon A, PA  cetirizine (ZYRTEC) 10 MG tablet TAKE 2 TABLETS(20 MG) BY MOUTH TWICE DAILY 04/20/22   Ellamae Sia, DO  EPIPEN 2-PAK 0.3 MG/0.3ML SOAJ injection Inject 0.3 mg into the muscle once as needed for anaphylaxis. 07/24/22   Ellamae Sia, DO  famotidine (PEPCID) 20 MG tablet TAKE 1 TABLET(20 MG) BY MOUTH TWICE DAILY 04/20/22   Ellamae Sia, DO  VENTOLIN HFA 108 (90 Base) MCG/ACT inhaler INHALE 2 PUFFS INTO THE LUNGS EVERY 4 HOURS AS NEEDED FOR WHEEZING OR SHORTNESS OF BREATH 07/05/22   Nehemiah Settle, FNP  fluticasone (FLONASE) 50 MCG/ACT nasal spray Place 1 spray into both nostrils daily. Patient not taking: No sig reported 01/06/19 08/06/20  Ellamae Sia, DO  nortriptyline (PAMELOR)  25 MG capsule Take 1 capsule (25 mg total) by mouth at bedtime. 04/09/20 08/06/20  Van Clines, MD      Allergies    Adhesive [tape], Bee venom, Reglan [metoclopramide], and Amoxil [amoxicillin]    Review of Systems   Review of Systems  All other systems reviewed and are negative.   Physical Exam Updated Vital Signs BP (!) 136/96 (BP Location: Left Arm)   Pulse (!) 114   Temp 98.3 F (36.8 C) (Oral)   Resp 18   Ht  (1.575 m)   Wt 75.8 kg   LMP 08/14/2016 (Exact Date)   SpO2 99%   BMI 30.54 kg/m  Physical Exam Vitals and nursing note reviewed.  Constitutional:      General: She is not in acute distress.    Appearance: She is well-developed.  HENT:     Head: Normocephalic and atraumatic.  Eyes:     Conjunctiva/sclera: Conjunctivae normal.  Cardiovascular:     Rate and Rhythm: Normal rate and regular rhythm.     Heart sounds: No murmur heard. Pulmonary:     Effort: Pulmonary effort is normal. No respiratory distress.     Breath sounds: Normal breath sounds.  Abdominal:     Palpations: Abdomen is soft.     Tenderness: There is no abdominal tenderness.  Musculoskeletal:        General: No swelling.     Cervical back: Neck supple.  Skin:    General: Skin is warm and dry.     Capillary  Refill: Capillary refill takes less than 2 seconds.     Comments: Patient full range of motion of bilateral wrists, digits.  Small area of mild erythema measuring approximately 0.5 cm in diameter appreciated on dorsal aspect of proximal left index finger.  2 small circular scabbed areas appreciated in the center indicative of possible bite.  No obvious extension of erythema.  No palpable fluctuance/induration.  Area not warm to the touch comparatively to surrounding skin and slightly tender to palpation.  No sensory deficits distally.  Neurological:     Mental Status: She is alert.  Psychiatric:        Mood and Affect: Mood normal.     ED Results / Procedures / Treatments    Labs (all labs ordered are listed, but only abnormal results are displayed) Labs Reviewed - No data to display  EKG None  Radiology No results found.  Procedures Procedures    Medications Ordered in ED Medications - No data to display  ED Course/ Medical Decision Making/ A&P                             Medical Decision Making Risk Prescription drug management.   This patient presents to the ED for concern of insect bite, this involves an extensive number of treatment options, and is a complaint that carries with it a high risk of complications and morbidity.  The differential diagnosis includes black widow/brown includes bite, insect bite, cellulitis, erysipelas, abscess formation, necrotizing fasciitis   Co morbidities that complicate the patient evaluation  See HPI   Additional history obtained:  Additional history obtained from EMR External records from outside source obtained and reviewed including hospital records   Lab Tests:  N/a   Imaging Studies ordered:  N/a   Cardiac Monitoring: / EKG:  The patient was maintained on a cardiac monitor.  I personally viewed and interpreted the cardiac monitored which showed an underlying rhythm of: Sinus rhythm   Consultations Obtained:  N/a   Problem List / ED Course / Critical interventions / Medication management  Insect bite Reevaluation of the patient showed that the patient stayed the same I have reviewed the patients home medicines and have made adjustments as needed   Social Determinants of Health:  Former cigarette use.  Denies illicit drug use   Test / Admission - Considered:  Insect bite Vitals signs within normal range and stable throughout visit. 32 year old female presents emergency department with complaints of insect bite.  No signs of cellulitis, erysipelas necrotizing fasciitis.  No obvious surrounding necrosis, ecchymotic or purple appearing skin.  Patient without neurovascular  compromise or appreciable weakness.  Will treat at home with topical steroidal cream as well as recommend oral antihistamine therapy.  Close follow-up recommended with primary care for reassessment of symptoms.  Treatment plan discussed at length with patient she acknowledged understanding was agreeable to said plan. Worrisome signs and symptoms were discussed with the patient, and the patient acknowledged understanding to return to the ED if noticed. Patient was stable upon discharge.          Final Clinical Impression(s) / ED Diagnoses Final diagnoses:  Insect bite of left index finger, initial encounter    Rx / DC Orders ED Discharge Orders          Ordered    triamcinolone cream (KENALOG) 0.1 %  2 times daily        10/31/22 1748  Peter Garter, [place] 10/31/22 1757    Ernie Avena, MD 10/31/22 2230

## 2022-10-31 NOTE — ED Triage Notes (Signed)
Pt arrives to ED with c/o possible insect bite to right index finger last night. She notes she thinks it is a spider.

## 2022-11-07 ENCOUNTER — Telehealth: Payer: Self-pay | Admitting: Neurology

## 2022-11-07 ENCOUNTER — Ambulatory Visit (INDEPENDENT_AMBULATORY_CARE_PROVIDER_SITE_OTHER): Payer: Medicaid Other

## 2022-11-07 DIAGNOSIS — G43009 Migraine without aura, not intractable, without status migrainosus: Secondary | ICD-10-CM

## 2022-11-07 MED ORDER — DIPHENHYDRAMINE HCL 50 MG/ML IJ SOLN
50.0000 mg | Freq: Once | INTRAMUSCULAR | Status: AC
  Administered 2022-11-07: 25 mg via INTRAMUSCULAR

## 2022-11-07 MED ORDER — DIPHENHYDRAMINE HCL 50 MG/ML IJ SOLN
25.0000 mg | Freq: Once | INTRAMUSCULAR | Status: AC
Start: 2022-11-07 — End: 2022-11-07
  Administered 2022-11-07: 25 mg via INTRAVENOUS

## 2022-11-07 MED ORDER — KETOROLAC TROMETHAMINE 60 MG/2ML IM SOLN
60.0000 mg | Freq: Once | INTRAMUSCULAR | Status: AC
Start: 2022-11-07 — End: 2022-11-07
  Administered 2022-11-07: 60 mg via INTRAMUSCULAR

## 2022-11-07 NOTE — Telephone Encounter (Signed)
Front to call patient to add to the schedule for Headache cocktail today or tomorrow.

## 2022-11-07 NOTE — Telephone Encounter (Signed)
Ok for migraine cocktail, thanks 

## 2022-11-07 NOTE — Telephone Encounter (Signed)
New message    Patient is asking for a migraine cocktail.   Please advise.

## 2022-11-07 NOTE — Telephone Encounter (Signed)
Patient called stating she has not heard back about coming in to get a migraine cocktail. States she would like to come in today.

## 2022-11-09 ENCOUNTER — Other Ambulatory Visit: Payer: Self-pay

## 2022-11-09 ENCOUNTER — Encounter (HOSPITAL_BASED_OUTPATIENT_CLINIC_OR_DEPARTMENT_OTHER): Payer: Self-pay

## 2022-11-09 ENCOUNTER — Emergency Department (HOSPITAL_BASED_OUTPATIENT_CLINIC_OR_DEPARTMENT_OTHER)
Admission: EM | Admit: 2022-11-09 | Discharge: 2022-11-09 | Disposition: A | Discharge status: Home / Self Care | Payer: Medicaid Other | Attending: Emergency Medicine | Admitting: Emergency Medicine

## 2022-11-09 DIAGNOSIS — R519 Headache, unspecified: Secondary | ICD-10-CM | POA: Diagnosis present

## 2022-11-09 DIAGNOSIS — G43909 Migraine, unspecified, not intractable, without status migrainosus: Secondary | ICD-10-CM | POA: Insufficient documentation

## 2022-11-09 DIAGNOSIS — M545 Low back pain, unspecified: Secondary | ICD-10-CM | POA: Diagnosis not present

## 2022-11-09 MED ORDER — DIPHENHYDRAMINE HCL 50 MG/ML IJ SOLN
50.0000 mg | Freq: Once | INTRAMUSCULAR | Status: AC
  Administered 2022-11-09: 50 mg via INTRAVENOUS
  Filled 2022-11-09: qty 1

## 2022-11-09 MED ORDER — SODIUM CHLORIDE 0.9 % IV BOLUS
1000.0000 mL | Freq: Once | INTRAVENOUS | Status: AC
  Administered 2022-11-09: 1000 mL via INTRAVENOUS

## 2022-11-09 MED ORDER — KETOROLAC TROMETHAMINE 15 MG/ML IJ SOLN
15.0000 mg | Freq: Once | INTRAMUSCULAR | Status: AC
  Administered 2022-11-09: 15 mg via INTRAVENOUS
  Filled 2022-11-09: qty 1

## 2022-11-09 MED ORDER — ONDANSETRON HCL 4 MG/2ML IJ SOLN
4.0000 mg | Freq: Once | INTRAMUSCULAR | Status: AC
  Administered 2022-11-09: 4 mg via INTRAVENOUS
  Filled 2022-11-09: qty 2

## 2022-11-09 NOTE — ED Triage Notes (Signed)
"  Migraine for 2 day,  went to neurologist yesterday and got migraine cocktail but it wore off today" rates pain 8/10 with associated nausea

## 2022-11-09 NOTE — ED Notes (Signed)
ED Provider at bedside. 

## 2022-11-09 NOTE — ED Notes (Signed)
RN reviewed discharge instructions with pt. Pt verbalized understanding and had no further questions. VSS upon discharge.  

## 2022-11-09 NOTE — Discharge Instructions (Signed)
Follow-up with your neurologist if your headaches continue.  Return to the emergency room if you have any worsening symptoms or an unusual type of headache.

## 2022-11-09 NOTE — ED Provider Notes (Signed)
[place] EMERGENCY DEPARTMENT AT [facility] Provider Note   CSN: [id number] Arrival date & time: 11/09/22  1844     History  Chief Complaint  Patient presents with   Migraine    "Migraine for 2 day,  went to neurologist yesterday and got migraine cocktail but it wore off today" rates pain 8/10 with associated nausea     Michelle [name] is a 32 y.o. female.  Patient is a 32 year old female who presents with migraine.  She has a history of migraines.  She is followed by neurology.  She said she had a migraine 2 days ago and went to the neurology office for migraine cocktail.  She was said she was given a shot of a steroid there as well as Benadryl and she thinks tramadol but she is not sure.  She had onset of a headache earlier this afternoon.  She says it is left-sided.  There is no neck pain.  She had some nausea but no vomiting.  Some photophobia.  It feels similar to prior migrainous type headaches she has had in the past.  No atypical symptoms other than she says it waxes and wanes in intensity which is a little bit unusual.  No recent head trauma.  No fevers.       Home Medications Prior to Admission medications   Medication Sig Start Date End Date Taking? Authorizing Provider  cetirizine (ZYRTEC) 10 MG tablet TAKE 2 TABLETS(20 MG) BY MOUTH TWICE DAILY 04/20/22   Ellamae Sia, DO  EPIPEN 2-PAK 0.3 MG/0.3ML SOAJ injection Inject 0.3 mg into the muscle once as needed for anaphylaxis. 07/24/22   Ellamae Sia, DO  famotidine (PEPCID) 20 MG tablet TAKE 1 TABLET(20 MG) BY MOUTH TWICE DAILY 04/20/22   Ellamae Sia, DO  VENTOLIN HFA 108 (90 Base) MCG/ACT inhaler INHALE 2 PUFFS INTO THE LUNGS EVERY 4 HOURS AS NEEDED FOR WHEEZING OR SHORTNESS OF BREATH 07/05/22   Nehemiah Settle, FNP  fluticasone (FLONASE) 50 MCG/ACT nasal spray Place 1 spray into both nostrils daily. Patient not taking: No sig reported 01/06/19 08/06/20  Ellamae Sia, DO  nortriptyline (PAMELOR) 25 MG capsule Take 1  capsule (25 mg total) by mouth at bedtime. 04/09/20 08/06/20  Van Clines, MD      Allergies    Adhesive [tape], Bee venom, Reglan [metoclopramide], and Amoxil [amoxicillin]    Review of Systems   Review of Systems  Constitutional:  Negative for chills, diaphoresis, fatigue and fever.  HENT:  Negative for congestion, rhinorrhea and sneezing.   Eyes: Negative.   Respiratory:  Negative for cough, chest tightness and shortness of breath.   Cardiovascular:  Negative for chest pain and leg swelling.  Gastrointestinal:  Positive for nausea. Negative for abdominal pain, blood in stool, diarrhea and vomiting.  Genitourinary:  Negative for difficulty urinating, flank pain, frequency and hematuria.  Musculoskeletal:  Negative for arthralgias and back pain.  Skin:  Negative for rash.  Neurological:  Positive for headaches. Negative for dizziness, speech difficulty, weakness and numbness.    Physical Exam Updated Vital Signs BP 106/80   Pulse 90   Temp 98.2 F (36.8 C) (Oral)   Resp 17   Ht  (1.575 m)   Wt 77.1 kg   LMP 08/14/2016 (Exact Date)   SpO2 99%   BMI 31.09 kg/m  Physical Exam Constitutional:      Appearance: She is well-developed.  HENT:     Head: Normocephalic and atraumatic.  Eyes:     Pupils: Pupils are equal, round, and reactive to light.  Neck:     Comments: No meningismus Cardiovascular:     Rate and Rhythm: Normal rate and regular rhythm.     Heart sounds: Normal heart sounds.  Pulmonary:     Effort: Pulmonary effort is normal. No respiratory distress.     Breath sounds: Normal breath sounds. No wheezing or rales.  Chest:     Chest wall: No tenderness.  Abdominal:     General: Bowel sounds are normal.     Palpations: Abdomen is soft.     Tenderness: There is no abdominal tenderness. There is no guarding or rebound.  Musculoskeletal:        General: Normal range of motion.     Cervical back: Normal range of motion and neck supple.  Lymphadenopathy:      Cervical: No cervical adenopathy.  Skin:    General: Skin is warm and dry.     Findings: No rash.  Neurological:     General: No focal deficit present.     Mental Status: She is alert and oriented to person, place, and time.     ED Results / Procedures / Treatments   Labs (all labs ordered are listed, but only abnormal results are displayed) Labs Reviewed - No data to display  EKG None  Radiology No results found.  Procedures Procedures    Medications Ordered in ED Medications  diphenhydrAMINE (BENADRYL) injection 50 mg (50 mg Intravenous Given 11/09/22 2141)  ondansetron (ZOFRAN) injection 4 mg (4 mg Intravenous Given 11/09/22 2138)  ketorolac (TORADOL) 15 MG/ML injection 15 mg (15 mg Intravenous Given 11/09/22 2141)  sodium chloride 0.9 % bolus 1,000 mL (0 mLs Intravenous Stopped 11/09/22 2222)    ED Course/ Medical Decision Making/ A&P                             Medical Decision Making Amount and/or Complexity of Data Reviewed External Data Reviewed: notes.  Risk Drug therapy requiring intensive monitoring for toxicity. Decision regarding hospitalization.   Patient is a 32 year old female who presents with a migraine.  It seems like a typical pain for her prior migraines.  No neck pain.  No other symptoms that would be more concerning for meningitis or subarachnoid hemorrhage.  No neurologic deficits.  No fevers or other suggestions of infection.  She was given IV fluids along with Benadryl, Zofran and Toradol.  She feels much better and says her headache is much better and she is ready to go.  She was discharged home in good condition.  She was encouraged to follow-up with her neurologist if her symptoms continue.  Return precautions were given.  Final Clinical Impression(s) / ED Diagnoses Final diagnoses:  Migraine without status migrainosus, not intractable, unspecified migraine type    Rx / DC Orders ED Discharge Orders     None         Rolan Bucco, MD 11/09/22 2229

## 2022-11-11 ENCOUNTER — Encounter (HOSPITAL_COMMUNITY): Payer: Self-pay | Admitting: Emergency Medicine

## 2022-11-11 ENCOUNTER — Ambulatory Visit (HOSPITAL_COMMUNITY)
Admission: EM | Admit: 2022-11-11 | Discharge: 2022-11-11 | Disposition: A | Discharge status: Home / Self Care | Payer: Medicaid Other

## 2022-11-11 DIAGNOSIS — R21 Rash and other nonspecific skin eruption: Secondary | ICD-10-CM | POA: Diagnosis not present

## 2022-11-11 NOTE — ED Provider Notes (Signed)
[facility]   MRN: [id number] DOB: 1991-05-27  Subjective:   Michelle [name] is a 32 y.o. female presenting for new rash on chest this morning. Symptoms started after a hot bath. No new soaps or shampoo. She was having some watery eyes and runny nose as well. While waiting to be examined today, her symptoms resolved. No CP or SOB. No lip or tongue swelling. Feels fine now per patient.   No current facility-administered medications for this encounter.  Current Outpatient Medications:    cyclobenzaprine (FLEXERIL) 5 MG tablet, Take 5 mg by mouth 3 (three) times daily as needed for muscle spasms., Disp: , Rfl:    cetirizine (ZYRTEC) 10 MG tablet, TAKE 2 TABLETS(20 MG) BY MOUTH TWICE DAILY, Disp: 120 tablet, Rfl: 5   EPIPEN 2-PAK 0.3 MG/0.3ML SOAJ injection, Inject 0.3 mg into the muscle once as needed for anaphylaxis., Disp: 2 each, Rfl: 1   famotidine (PEPCID) 20 MG tablet, TAKE 1 TABLET(20 MG) BY MOUTH TWICE DAILY, Disp: 60 tablet, Rfl: 5   VENTOLIN HFA 108 (90 Base) MCG/ACT inhaler, INHALE 2 PUFFS INTO THE LUNGS EVERY 4 HOURS AS NEEDED FOR WHEEZING OR SHORTNESS OF BREATH, Disp: 18 g, Rfl: 1   Allergies  Allergen Reactions   Adhesive [Tape] Hives and Other (See Comments)    EKG or heart monitor pads- caused raised, red areas (WELTS) on the skin; "sensitive" pads fell off   Bee Venom Hives and Other (See Comments)    Welts, also   Reglan [Metoclopramide]     "Bad side effects: severe depression, anxiety, body tremors"   Amoxil [Amoxicillin] Rash    Past Medical History:  Diagnosis Date   Anxiety    Asthma    Bipolar disorder (HCC)    no current med.   Depression    no current med.   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   Migraine    Nephrolithiasis    PTSD (post-traumatic stress disorder)    PTSD (post-traumatic stress disorder)    Pyelonephritis 01/16/2019   Seizures (HCC)    Tachycardia    TMJ (dislocation of temporomandibular joint)    Vertigo      Past Surgical History:  Procedure Laterality Date   ABDOMINAL HYSTERECTOMY     ADENOIDECTOMY, TONSILLECTOMY AND MYRINGOTOMY WITH TUBE PLACEMENT  1990's   APPENDECTOMY     CESAREAN SECTION  06/20/2012   Procedure: CESAREAN SECTION;  Surgeon: Allie Bossier, MD;  Location: [facility];  Service: Obstetrics;  Laterality: N/A;   HIP SURGERY     LAPAROSCOPIC APPENDECTOMY  07/25/2012   Procedure: APPENDECTOMY LAPAROSCOPIC;  Surgeon: Liz Malady, MD;  Location: [facility];  Service: General;  Laterality: N/A;   LAPAROSCOPIC TUBAL LIGATION Bilateral 08/04/2014   Procedure: BILATERAL LAPAROSCOPIC TUBAL LIGATION;  Surgeon: Adam Phenix, MD;  Location: [facility];  Service: Gynecology;  Laterality: Bilateral;   LIPOMA EXCISION N/A 09/16/2015   Procedure: EXCISION LIPOMA LUMBAR REGION;  Surgeon: Manus Rudd, MD;  Location: [facility];  Service: General;  Laterality: N/A;   TONSILLECTOMY  WISDOM TOOTH EXTRACTION      Family History  Problem Relation Age of Onset   Diabetes Mother    Anesthesia problems Mother        hard to wake up post-op   Atrial fibrillation Mother    Congestive Heart Failure Mother    Diabetes Father    Heart disease Father    Diabetes Sister    Anesthesia problems Sister        hard to wake up post-op   Colon cancer Maternal Grandfather    Breast cancer Paternal Grandmother    Colon cancer Paternal Grandfather    Colon polyps Maternal Aunt    Eczema Neg Hx    Allergic rhinitis Neg Hx    Asthma Neg Hx     Social History   Tobacco Use   Smoking status: Former    Packs/day: 0.00    Years:  14.00    Additional pack years: 0.00    Total pack years: 0.00    Types: Cigarettes    Quit date: 03/17/2017    Years since quitting: 5.6    Passive exposure: Never   Smokeless tobacco: Never  Vaping Use   Vaping Use: Some days  Substance Use Topics   Alcohol use: No   Drug use: No    ROS REFER TO HPI FOR PERTINENT POSITIVES AND NEGATIVES   Objective:   Vitals: BP 105/74 (BP Location: Left Arm)   Pulse 85   Temp 97.8 F (36.6 C) (Oral)   Resp 16   LMP 08/14/2016 (Exact Date)   SpO2 98%   Physical Exam Vitals and nursing note reviewed.  Constitutional:      General: She is not in acute distress.    Appearance: Normal appearance. She is not ill-appearing.  HENT:     Head: Normocephalic.     Right Ear: External ear normal.     Left Ear: External ear normal.     Nose: Rhinorrhea present. No congestion.     Mouth/Throat:     Mouth: Mucous membranes are moist.     Pharynx: Oropharynx is clear. No oropharyngeal exudate or posterior oropharyngeal erythema.  Eyes:     Extraocular Movements: Extraocular movements intact.     Conjunctiva/sclera: Conjunctivae normal.     Pupils: Pupils are equal, round, and reactive to light.  Cardiovascular:     Rate and Rhythm: Normal rate and regular rhythm.     Pulses: Normal pulses.     Heart sounds: Normal heart sounds. No murmur heard. Pulmonary:     Effort: Pulmonary effort is normal. No respiratory distress.     Breath sounds: Normal breath sounds. No wheezing.  Musculoskeletal:     Cervical back: Normal range of motion.  Skin:    General: Skin is warm.     Findings: No rash.     Comments: Scratch marks on chest, but no hives or other rash present at this time.   Neurological:     Mental Status: She is alert and oriented to person, place, and time.  Psychiatric:        Mood and Affect: Mood normal.        Behavior: Behavior normal.     No results found for this or any previous visit (from the past 24  hour(s)).  Assessment and Plan :   PDMP not reviewed this encounter.  1. Rash and nonspecific skin eruption    Likely heat dermatitis, resolved. Patient cautioned to avoid hot baths.  She  needs to stay cool and hydrated today. She will continue her daily cetirizine 10 mg and Pepcid 20 mg twice daily. Red flags discussed, return precautions discussed.     AllwardtCrist Infante, PA-C 11/11/22 1241

## 2022-11-11 NOTE — Discharge Instructions (Addendum)
Thanks for coming in today.  Glad to see that symptoms have resolved.  Recommend you continue your daily Zyrtec and famotidine as directed.  Keep cool and well-hydrated.  Monitor if any changes.  Use your EpiPen as directed if needed for severe allergic reaction.

## 2022-11-11 NOTE — ED Triage Notes (Signed)
About hour and half ago started itching on chest, eye are watering and nose is running. Denies anything new today.

## 2022-12-03 ENCOUNTER — Other Ambulatory Visit: Payer: Self-pay | Admitting: Allergy

## 2022-12-20 DIAGNOSIS — L989 Disorder of the skin and subcutaneous tissue, unspecified: Secondary | ICD-10-CM | POA: Diagnosis not present

## 2022-12-20 DIAGNOSIS — T148XXA Other injury of unspecified body region, initial encounter: Secondary | ICD-10-CM | POA: Diagnosis not present

## 2022-12-21 ENCOUNTER — Encounter (HOSPITAL_BASED_OUTPATIENT_CLINIC_OR_DEPARTMENT_OTHER): Payer: Self-pay

## 2022-12-21 ENCOUNTER — Other Ambulatory Visit: Payer: Self-pay

## 2022-12-21 ENCOUNTER — Emergency Department (HOSPITAL_BASED_OUTPATIENT_CLINIC_OR_DEPARTMENT_OTHER)
Admission: EM | Admit: 2022-12-21 | Discharge: 2022-12-21 | Disposition: A | Discharge status: Home / Self Care | Payer: Medicaid Other | Attending: Emergency Medicine | Admitting: Emergency Medicine

## 2022-12-21 DIAGNOSIS — L989 Disorder of the skin and subcutaneous tissue, unspecified: Secondary | ICD-10-CM

## 2022-12-21 DIAGNOSIS — R200 Anesthesia of skin: Secondary | ICD-10-CM | POA: Diagnosis not present

## 2022-12-21 DIAGNOSIS — R21 Rash and other nonspecific skin eruption: Secondary | ICD-10-CM | POA: Diagnosis present

## 2022-12-21 MED ORDER — CEPHALEXIN 500 MG PO CAPS: 500.0000 mg | ORAL_CAPSULE | Freq: Four times a day (QID) | ORAL | 0 refills | Status: DC

## 2022-12-21 MED ORDER — PREDNISONE 10 MG PO TABS: 20.0000 mg | ORAL_TABLET | Freq: Every day | ORAL | 0 refills | Status: DC

## 2022-12-21 NOTE — ED Notes (Signed)
Pt discharged to home using teachback Method. Discharge instructions have been discussed with patient and/or family members. Pt verbally acknowledges understanding d/c instructions, has been given opportunity for questions to be answered, and endorses comprehension to checkout at registration before leaving.  

## 2022-12-21 NOTE — ED Provider Notes (Signed)
[place] EMERGENCY DEPARTMENT AT [facility] Provider Note   CSN: [id number] Arrival date & time: 12/21/22  4098     History  Chief Complaint  Patient presents with   Rash    Michelle [name] is a 32 y.o. female.  HPI 32 year old female history of Ehlers-Danlos syndrome, seasonal allergies, presents today with erythematous area to medial left thigh.  She states that this began several days ago.  She was outside the days preceding this.  She states the area itches and has some redness.  Has had some slight swelling.  She has not had fever, chills, nausea, vomiting.  She has had no similar symptoms to this in the past     Home Medications Prior to Admission medications   Medication Sig Start Date End Date Taking? Authorizing Provider  cephALEXin (KEFLEX) 500 MG capsule Take 1 capsule (500 mg total) by mouth 4 (four) times daily. 12/21/22  Yes Margarita Grizzle, MD  predniSONE (DELTASONE) 10 MG tablet Take 2 tablets (20 mg total) by mouth daily. 12/21/22  Yes Margarita Grizzle, MD  cetirizine (ZYRTEC) 10 MG tablet TAKE 2 TABLETS(20 MG) BY MOUTH TWICE DAILY 04/20/22   Ellamae Sia, DO  cyclobenzaprine (FLEXERIL) 5 MG tablet Take 5 mg by mouth 3 (three) times daily as needed for muscle spasms. 11/09/22   [provider]  EPIPEN 2-PAK 0.3 MG/0.3ML SOAJ injection Inject 0.3 mg into the muscle once as needed for anaphylaxis. 07/24/22   Ellamae Sia, DO  famotidine (PEPCID) 20 MG tablet TAKE 1 TABLET(20 MG) BY MOUTH TWICE DAILY 04/20/22   Ellamae Sia, DO  VENTOLIN HFA 108 (90 Base) MCG/ACT inhaler INHALE 2 PUFFS INTO THE LUNGS EVERY 4 HOURS AS NEEDED FOR WHEEZING OR SHORTNESS OF BREATH 07/05/22   Nehemiah Settle, FNP  fluticasone (FLONASE) 50 MCG/ACT nasal spray Place 1 spray into both nostrils daily. Patient not taking: No sig reported 01/06/19 08/06/20  Ellamae Sia, DO  nortriptyline (PAMELOR) 25 MG capsule Take 1 capsule (25 mg total) by mouth at bedtime. 04/09/20 08/06/20  Van Clines, MD       Allergies    Adhesive [tape], Bee venom, Reglan [metoclopramide], and Amoxil [amoxicillin]    Review of Systems   Review of Systems  Physical Exam Updated Vital Signs BP 101/76 (BP Location: Right Arm)   Pulse 87   Temp 98.2 F (36.8 C) (Oral)   Resp 18   Ht 1.575 m (5\' 2" )   Wt 77.1 kg   LMP 08/14/2016 (Exact Date)   SpO2 100%   BMI 31.09 kg/m  Physical Exam Vitals and nursing note reviewed.  Constitutional:      General: She is not in acute distress.    Appearance: She is well-developed.  HENT:     Head: Normocephalic and atraumatic.     Right Ear: External ear normal.     Left Ear: External ear normal.     Nose: Nose normal.  Eyes:     Conjunctiva/sclera: Conjunctivae normal.     Pupils: Pupils are equal, round, and reactive to light.  Pulmonary:     Effort: Pulmonary effort is normal.  Musculoskeletal:        General: Normal range of motion.     Cervical back: Normal range of motion and neck supple.  Skin:    General: Skin is warm and dry.     Comments: Erythematous area to medial left thigh with some small vesiculation.  There is no induration, fluctuance,  or tenderness.  Area is approximately 4 x 2 cm  Neurological:     Mental Status: She is alert and oriented to person, place, and time.     Motor: No abnormal muscle tone.     Coordination: Coordination normal.  Psychiatric:        Behavior: Behavior normal.        Thought Content: Thought content normal.     ED Results / Procedures / Treatments   Labs (all labs ordered are listed, but only abnormal results are displayed) Labs Reviewed - No data to display  EKG None  Radiology No results found.  Procedures Procedures    Medications Ordered in ED Medications - No data to display  ED Course/ Medical Decision Making/ A&P                             Medical Decision Making  32 year old female with lesion to left thigh area.  There is mild discomfort but mainly itching with it.  She  has been outside recently. Differential diagnosis includes but is not limited to allergic dermatitis such as poison oak, poison ivy, bug bite, early cellulitis Plan Short course of steroids, Benadryl, and Keflex. Discussed return precautions and need for follow-up.        Final Clinical Impression(s) / ED Diagnoses Final diagnoses:  Skin lesion    Rx / DC Orders ED Discharge Orders          Ordered    cephALEXin (KEFLEX) 500 MG capsule  4 times daily        12/21/22 0931    predniSONE (DELTASONE) 10 MG tablet  Daily        12/21/22 0931              Margarita Grizzle, MD 12/21/22 [phone]

## 2022-12-21 NOTE — ED Triage Notes (Signed)
Onset Monday raised redden area to right inner thigh.  States is itchy and getting worse

## 2022-12-21 NOTE — Discharge Instructions (Addendum)
Please take Benadryl 25 mg every 6 hours. You may use warm soaks to area.  Please complete antibiotics and prednisone as prescribed. Return for reevaluation if you have worsening symptoms such as increased redness, swelling, fever, chills, or other new symptoms

## 2022-12-22 ENCOUNTER — Other Ambulatory Visit: Payer: Self-pay

## 2022-12-22 ENCOUNTER — Encounter (HOSPITAL_BASED_OUTPATIENT_CLINIC_OR_DEPARTMENT_OTHER): Payer: Self-pay | Admitting: Emergency Medicine

## 2022-12-22 ENCOUNTER — Telehealth: Payer: Self-pay | Admitting: *Deleted

## 2022-12-22 ENCOUNTER — Emergency Department (HOSPITAL_BASED_OUTPATIENT_CLINIC_OR_DEPARTMENT_OTHER)
Admission: EM | Admit: 2022-12-22 | Discharge: 2022-12-22 | Disposition: A | Discharge status: Home / Self Care | Payer: Medicaid Other | Attending: Emergency Medicine | Admitting: Emergency Medicine

## 2022-12-22 DIAGNOSIS — T23122A Burn of first degree of single left finger (nail) except thumb, initial encounter: Secondary | ICD-10-CM | POA: Diagnosis not present

## 2022-12-22 DIAGNOSIS — T300 Burn of unspecified body region, unspecified degree: Secondary | ICD-10-CM

## 2022-12-22 DIAGNOSIS — T23222A Burn of second degree of single left finger (nail) except thumb, initial encounter: Secondary | ICD-10-CM | POA: Diagnosis not present

## 2022-12-22 DIAGNOSIS — S6992XA Unspecified injury of left wrist, hand and finger(s), initial encounter: Secondary | ICD-10-CM | POA: Diagnosis present

## 2022-12-22 DIAGNOSIS — X110XXA Contact with hot water in bath or tub, initial encounter: Secondary | ICD-10-CM | POA: Diagnosis not present

## 2022-12-22 DIAGNOSIS — T310 Burns involving less than 10% of body surface: Secondary | ICD-10-CM | POA: Diagnosis not present

## 2022-12-22 MED ORDER — SILVER SULFADIAZINE 1 % EX CREA
TOPICAL_CREAM | Freq: Once | CUTANEOUS | Status: AC
  Filled 2022-12-22: qty 85

## 2022-12-22 NOTE — Transitions of Care (Post Inpatient/ED Visit) (Signed)
12/22/2022  Name: Michelle [name] MRN: [id number] DOB: 1990/09/12  Today's TOC FU Call Status: Today's TOC FU Call Status:: Successful TOC FU Call Competed TOC FU Call Complete Date: 12/22/22  Transition Care Management Follow-up Telephone Call Date of Discharge: 12/21/22 Discharge Facility: [facility] ([facility]) Type of Discharge: Emergency Department Reason for ED Visit: Other: (rash) How have you been since you were released from the hospital?: Better Any questions or concerns?: No  Items Reviewed: Did you receive and understand the discharge instructions provided?: Yes Medications obtained,verified, and reconciled?: Yes (Medications Reviewed) Any new allergies since your discharge?: No Dietary orders reviewed?: NA Do you have support at home?: Yes People in Home: child(ren), dependent, parent(s) Name of Support/Comfort Primary Source: Mom, daughter and son  Medications Reviewed Today: Medications Reviewed Today     Reviewed by Heidi Dach, RN (Registered Nurse) on 12/22/22 at 1116  Med List Status: <None>   Medication Order Taking? Sig Documenting Provider Last Dose Status Informant  cephALEXin (KEFLEX) 500 MG capsule [id number] Yes Take 1 capsule (500 mg total) by mouth 4 (four) times daily. Margarita Grizzle, MD Taking Active   cetirizine (ZYRTEC) 10 MG tablet [id number] Yes TAKE 2 TABLETS(20 MG) BY MOUTH TWICE DAILY Ellamae Sia, DO Taking Active   cyclobenzaprine (FLEXERIL) 5 MG tablet [id number] No Take 5 mg by mouth 3 (three) times daily as needed for muscle spasms.  Patient not taking: Reported on 12/22/2022   [provider] Not Taking Active   EPIPEN 2-PAK 0.3 MG/0.3ML SOAJ injection [id number] Yes Inject 0.3 mg into the muscle once as needed for anaphylaxis. Ellamae Sia, DO Taking Active   famotidine (PEPCID) 20 MG tablet [id number] Yes TAKE 1 TABLET(20 MG) BY MOUTH TWICE DAILY Ellamae Sia, DO Taking Active    Patient not taking:   Discontinued 08/06/20 1226    Discontinued 08/06/20 1226          Med Note Katrinka Blazing, JEFFREY W   Mon Apr 19, 2020  9:27 AM) Has not picked up prescription  predniSONE (DELTASONE) 10 MG tablet [id number] Yes Take 2 tablets (20 mg total) by mouth daily. Margarita Grizzle, MD Taking Active   VENTOLIN HFA 108 [phone] Base) MCG/ACT inhaler [id number] Yes INHALE 2 PUFFS INTO THE LUNGS EVERY 4 HOURS AS NEEDED FOR WHEEZING OR SHORTNESS OF Marchia Meiers, FNP Taking Active             Home Care and Equipment/Supplies: Were Home Health Services Ordered?: NA Any new equipment or medical supplies ordered?: NA  Functional Questionnaire: Do you need assistance with bathing/showering or dressing?: No Do you need assistance with meal preparation?: No Do you need assistance with eating?: No Do you have difficulty maintaining continence: No Do you need assistance with getting out of bed/getting out of a chair/moving?: No Do you have difficulty managing or taking your medications?: No  Follow up appointments reviewed: PCP Follow-up appointment confirmed?: NA Specialist Hospital Follow-up appointment confirmed?: NA Do you need transportation to your follow-up appointment?: No Do you understand care options if your condition(s) worsen?: Yes-patient verbalized understanding  SDOH Interventions Today    Flowsheet Row Most Recent Value  SDOH Interventions   Transportation Interventions Intervention Not Indicated      The patient was given information about care management services as a benefit of their Medicaid health plan today.   Patient  did not agree to enrollment in care management services and does not wish to consider at this time.   Estanislado Emms RN, BSN [place]  Managed [facility] RN Care Coordinator [phone]

## 2022-12-22 NOTE — ED Triage Notes (Signed)
Pt presents to ed for concerns for superficial burn to left middle finger that occurred today around 7:45 pm from boiling hot water.

## 2022-12-22 NOTE — Discharge Instructions (Addendum)
Apply the Silvadene twice daily after washing her skin.  Get help right away if: You have red streaks near the burn. You are in severe pain.

## 2022-12-22 NOTE — ED Provider Notes (Signed)
[place] EMERGENCY DEPARTMENT AT [facility] Provider Note   CSN: [id number] Arrival date & time: 12/22/22  2052     History  Chief Complaint  Patient presents with   Burn    Michelle [name] is a 32 y.o. female who reports burning her left middle finger tonight with boiling water.  Patient states that it is painful to the touch.  She has no circumferential pain, no blistering.  There is no breaks in the skin.   Burn      Home Medications Prior to Admission medications   Medication Sig Start Date End Date Taking? Authorizing Provider  cephALEXin (KEFLEX) 500 MG capsule Take 1 capsule (500 mg total) by mouth 4 (four) times daily. 12/21/22   Margarita Grizzle, MD  cetirizine (ZYRTEC) 10 MG tablet TAKE 2 TABLETS(20 MG) BY MOUTH TWICE DAILY 04/20/22   Ellamae Sia, DO  cyclobenzaprine (FLEXERIL) 5 MG tablet Take 5 mg by mouth 3 (three) times daily as needed for muscle spasms. Patient not taking: Reported on 12/22/2022 11/09/22   [provider]  EPIPEN 2-PAK 0.3 MG/0.3ML SOAJ injection Inject 0.3 mg into the muscle once as needed for anaphylaxis. 07/24/22   Ellamae Sia, DO  famotidine (PEPCID) 20 MG tablet TAKE 1 TABLET(20 MG) BY MOUTH TWICE DAILY 04/20/22   Ellamae Sia, DO  predniSONE (DELTASONE) 10 MG tablet Take 2 tablets (20 mg total) by mouth daily. 12/21/22   Margarita Grizzle, MD  VENTOLIN HFA 108 (90 Base) MCG/ACT inhaler INHALE 2 PUFFS INTO THE LUNGS EVERY 4 HOURS AS NEEDED FOR WHEEZING OR SHORTNESS OF BREATH 07/05/22   Nehemiah Settle, FNP  fluticasone [facility]) 50 MCG/ACT nasal spray Place 1 spray into both nostrils daily. Patient not taking: No sig reported 01/06/19 08/06/20  Ellamae Sia, DO  nortriptyline (PAMELOR) 25 MG capsule Take 1 capsule (25 mg total) by mouth at bedtime. 04/09/20 08/06/20  Van Clines, MD      Allergies    Adhesive [tape], Bee venom, Reglan [metoclopramide], and Amoxil [amoxicillin]    Review of Systems   Review of Systems  Physical  Exam Updated Vital Signs BP 116/87 (BP Location: Right Arm)   Pulse 100   Temp 98.2 F (36.8 C) (Oral)   Resp 20   Ht 5\' 2"  (1.575 m)   Wt 77.1 kg   LMP 08/14/2016 (Exact Date)   SpO2 100%   BMI 31.09 kg/m  Physical Exam Vitals and nursing note reviewed.  Constitutional:      General: She is not in acute distress.    Appearance: She is well-developed. She is not diaphoretic.  HENT:     Head: Normocephalic and atraumatic.     Right Ear: External ear normal.     Left Ear: External ear normal.     Nose: Nose normal.     Mouth/Throat:     Mouth: Mucous membranes are moist.  Eyes:     General: No scleral icterus.    Conjunctiva/sclera: Conjunctivae normal.  Cardiovascular:     Rate and Rhythm: Normal rate and regular rhythm.     Heart sounds: Normal heart sounds. No murmur heard.    No friction rub. No gallop.  Pulmonary:     Effort: Pulmonary effort is normal. No respiratory distress.     Breath sounds: Normal breath sounds.  Abdominal:     General: Bowel sounds are normal. There is no distension.     Palpations: Abdomen is soft. There is no mass.  Tenderness: There is no abdominal tenderness. There is no guarding.  Musculoskeletal:     Cervical back: Normal range of motion.     Comments: Mild erythema of the distal portion of the dorsal surface of the index finger on the left.  No full-thickness burns, no blistering.  Skin:    General: Skin is warm and dry.  Neurological:     Mental Status: She is alert and oriented to person, place, and time.  Psychiatric:        Behavior: Behavior normal.     ED Results / Procedures / Treatments   Labs (all labs ordered are listed, but only abnormal results are displayed) Labs Reviewed - No data to display  EKG None  Radiology No results found.  Procedures Procedures    Medications Ordered in ED Medications  silver sulfADIAZINE (SILVADENE) 1 % cream (has no administration in time range)    ED Course/ Medical  Decision Making/ A&P                             Medical Decision Making Risk Prescription drug management.   Patient appears to have a first-degree burn of her finger her.  No evidence of full-thickness burn.  Will have her use Silvadene or Neosporin.  Follow-up outpatient if symptoms worsening.  Discussed outpatient follow-up and return precautions.        Final Clinical Impression(s) / ED Diagnoses Final diagnoses:  Superficial burn    Rx / DC Orders ED Discharge Orders     None         Arthor Captain, PA-C 12/22/22 2318    Sloan Leiter, DO 12/23/22 0011

## 2022-12-26 ENCOUNTER — Telehealth: Payer: Self-pay

## 2022-12-26 NOTE — Transitions of Care (Post Inpatient/ED Visit) (Signed)
   12/26/2022  Name: Michelle [name] MRN: [id number] DOB: 12-05-1990  Today's TOC FU Call Status: Today's TOC FU Call Status:: Unsuccessul Call (1st Attempt) Unsuccessful Call (1st Attempt) Date: 12/26/22  Attempted to reach the patient regarding the most recent Inpatient/ED visit.  Follow Up Plan: Additional outreach attempts will be made to reach the patient to complete the Transitions of Care (Post Inpatient/ED visit) call.   Abelino Derrick, MHA [facility]  Managed [facility] Social Worker [phone]

## 2022-12-27 ENCOUNTER — Telehealth: Payer: Self-pay
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
## 2023-01-06 ENCOUNTER — Telehealth: Payer: Self-pay | Admitting: Family Medicine
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
## 2023-01-08 ENCOUNTER — Ambulatory Visit: Payer: Self-pay

## 2023-01-20 ENCOUNTER — Other Ambulatory Visit: Payer: Self-pay

## 2023-01-20 ENCOUNTER — Emergency Department (HOSPITAL_BASED_OUTPATIENT_CLINIC_OR_DEPARTMENT_OTHER)
Admission: EM | Admit: 2023-01-20 | Discharge: 2023-01-21 | Disposition: A | Discharge status: Home / Self Care | Payer: Medicaid Other | Attending: Emergency Medicine | Admitting: Emergency Medicine

## 2023-01-20 ENCOUNTER — Encounter (HOSPITAL_BASED_OUTPATIENT_CLINIC_OR_DEPARTMENT_OTHER): Payer: Self-pay

## 2023-01-20 DIAGNOSIS — H9201 Otalgia, right ear: Secondary | ICD-10-CM | POA: Diagnosis present

## 2023-01-20 DIAGNOSIS — H7291 Unspecified perforation of tympanic membrane, right ear: Secondary | ICD-10-CM | POA: Diagnosis not present

## 2023-01-20 MED ORDER — OFLOXACIN 0.3 % OP SOLN
5.0000 [drp] | Freq: Every day | OPHTHALMIC | Status: DC
  Filled 2023-01-20: qty 5

## 2023-01-20 NOTE — Discharge Instructions (Signed)
Please read and follow all provided instructions.  Your diagnoses today include:  1. Perforation of right tympanic membrane    Tests performed today include: Vital signs. See below for your results today.   Medications prescribed:  Ofloxacin (ear drops) - Use 5 drops in affected ear twice a day   Take any prescribed medications only as directed.  Home care instructions:  Follow any educational materials contained in this packet.  BE VERY CAREFUL not to take multiple medicines containing Tylenol (also called acetaminophen). Doing so can lead to an overdose which can damage your liver and cause liver failure and possibly death.   Follow-up instructions: Please follow-up with the ENT referral listed.  Call Monday for a follow-up appointment.  Return instructions:  Please return to the Emergency Department if you experience worsening symptoms.  Please return if you have any other emergent concerns.  Additional Information:  Your vital signs today were: BP (!) 119/90   Pulse 89   Temp 98.6 F (37 C)   Resp 20   Ht 5\' 2"  (1.575 m)   Wt 74.8 kg   LMP 08/14/2016 (Exact Date)   SpO2 100%   BMI 30.18 kg/m  If your blood pressure (BP) was elevated above 135/85 this visit, please have this repeated by your doctor within one month. --------------
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
## 2023-01-23 ENCOUNTER — Ambulatory Visit (INDEPENDENT_AMBULATORY_CARE_PROVIDER_SITE_OTHER): Payer: Medicaid Other | Admitting: Student

## 2023-01-23 ENCOUNTER — Encounter: Payer: Self-pay | Admitting: Student

## 2023-01-23 VITALS — BP 126/85 | HR 98 | Ht 62.0 in | Wt 168.5 lb

## 2023-01-23 DIAGNOSIS — H9201 Otalgia, right ear: Secondary | ICD-10-CM

## 2023-01-23 HISTORY — DX: Otalgia, right ear: H92.01

## 2023-01-23 NOTE — Patient Instructions (Signed)
It was great to see you today!   Your ear does not appear infected. I would continue using the drops the ER gave you as prescribed. Be careful not to submerge yourself in water, do not use Q-tips, and avoid getting water into your ear. If you begin to have pus draining out of your ear please call our office and let us know, we can send in antibiotics.   Future Appointments  Date Time Provider Department Center  02/01/2023  1:00 PM Ashok Croon, MD [facility] None  04/03/2023 11:30 AM Van Clines, MD [facility] None    Please arrive 15 minutes before your appointment to ensure smooth check in process.    Please call the clinic at [phone] if your symptoms worsen or you have any concerns.  Thank you for allowing me to participate in your care, Dr. Glendale Chard [facility]

## 2023-01-23 NOTE — Assessment & Plan Note (Signed)
No clinical signs of infection. Recommended to continue antibiotic drops as prescribed by ED. If patient begins to have purulent drainage from her ear prior to ENT appointment, will prescribe additional antibiotics.  - follow up with ENT
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
## 2023-01-26 ENCOUNTER — Telehealth: Payer: Self-pay

## 2023-01-26 DIAGNOSIS — H7290 Unspecified perforation of tympanic membrane, unspecified ear: Secondary | ICD-10-CM

## 2023-01-26 NOTE — Telephone Encounter (Signed)
Patient calls nurse line requesting a referral to ENT.   She reports she made an apt with ENT for 7/18 per ED recommendations. However, she reports she was told a referral needs to be placed which was not done at ED visit.   Apt is with [facility] and [facility].   Will forward to PCP to place.

## 2023-02-01 ENCOUNTER — Ambulatory Visit (INDEPENDENT_AMBULATORY_CARE_PROVIDER_SITE_OTHER): Payer: Medicaid Other | Admitting: Otolaryngology

## 2023-02-01 ENCOUNTER — Encounter (INDEPENDENT_AMBULATORY_CARE_PROVIDER_SITE_OTHER): Payer: Self-pay | Admitting: Otolaryngology

## 2023-02-01 VITALS — BP 110/73 | HR 91 | Ht 62.0 in | Wt 165.0 lb

## 2023-02-01 DIAGNOSIS — R0981 Nasal congestion: Secondary | ICD-10-CM | POA: Diagnosis not present

## 2023-02-01 DIAGNOSIS — H9191 Unspecified hearing loss, right ear: Secondary | ICD-10-CM

## 2023-02-01 DIAGNOSIS — H7291 Unspecified perforation of tympanic membrane, right ear: Secondary | ICD-10-CM | POA: Diagnosis not present

## 2023-02-01 DIAGNOSIS — H6993 Unspecified Eustachian tube disorder, bilateral: Secondary | ICD-10-CM

## 2023-02-01 DIAGNOSIS — J3089 Other allergic rhinitis: Secondary | ICD-10-CM | POA: Diagnosis not present

## 2023-02-01 MED ORDER — FLUTICASONE PROPIONATE 50 MCG/ACT NA SUSP: 2.0000 | Freq: Every day | NASAL | 6 refills | Status: DC

## 2023-02-01 MED ORDER — CETIRIZINE HCL 10 MG PO TABS: 10.0000 mg | ORAL_TABLET | Freq: Every day | ORAL | 11 refills | Status: DC
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
## 2023-02-01 NOTE — Patient Instructions (Signed)
-   schedule Audiogram

## 2023-02-06 ENCOUNTER — Ambulatory Visit: Payer: Medicaid Other | Admitting: Family Medicine

## 2023-02-06 ENCOUNTER — Encounter: Payer: Self-pay | Admitting: Family Medicine

## 2023-02-06 VITALS — BP 106/79 | HR 92 | Ht 62.0 in | Wt 166.6 lb

## 2023-02-06 DIAGNOSIS — H7290 Unspecified perforation of tympanic membrane, unspecified ear: Secondary | ICD-10-CM

## 2023-02-06 HISTORY — DX: Unspecified perforation of tympanic membrane, unspecified ear: H72.90

## 2023-02-06 NOTE — Assessment & Plan Note (Signed)
Suspect that patient's ear pain is likely due to eustachian tube dysfunction.  Will defer to ENT for further recommendations regarding treatment as they are following as above last week with recommendations to continue Zyrtec and Flonase daily.  Exam does not suggest current active otitis media or otitis externa the patient does not have systemic signs of viral or bacterial illness.  Recommend continue supportive care with Tylenol, and to call her ENT doctor for further recommendations.  Patient has hearing evaluation scheduled and follow-up with ENT afterwards.  Patient agreeable to plan and answered all questions.

## 2023-02-06 NOTE — Patient Instructions (Addendum)
It was great to see you! Thank you for allowing me to participate in your care!  Our plans for today:  -As we discussed, lets see how your ear does the next couple days.  I do not believe you have an ear infection at this time.  I recommend you call the ears nose and throat doctor and ask for any further recommendations.  Please go to your hearing appointment and follow-up with ENT.  Please use Tylenol as needed for pain.   Please arrive 15 minutes PRIOR to your next scheduled appointment time! If you do not, this affects OTHER patients' care.  Take care and seek immediate care sooner if you develop any concerns.   Celine Mans, MD, PGY-2 [facility] 2:35 PM 02/06/2023  [facility]

## 2023-02-06 NOTE — Progress Notes (Signed)
    SUBJECTIVE:   CHIEF COMPLAINT / HPI: ear pain  Ear pain going on for three days. Ache for a week, but after ENT visit last. Saw ENT for perforated right ear drum. No drainage, fever, chills, cough, runny nose. Non radiating. No oral pain. Not worse with jaw movement.  PERTINENT  PMH / PSH: Perforated right tympanic membrane.  OBJECTIVE:   BP 106/79   Pulse 92   Ht 5\' 2"  (1.575 m)   Wt 166 lb 9.6 oz (75.6 kg)   LMP 08/14/2016 (Exact Date)   SpO2 100%   BMI 30.47 kg/m   General: NAD, well appearing HEENT: Right-sided 2 to 3 mm perforation on inferior/posterior side of right TM, no effusion, mild erythema, right ear canal normal, no pain on palpation of the pinna no obvious drainage, moist mucous membranes, no posterior oropharyngeal erythema Neuro: A&O Respiratory: normal WOB on RA Extremities: Moving all 4 extremities equally   ASSESSMENT/PLAN:   Perforation of tympanic membrane, unspecified laterality Assessment & Plan: Suspect that patient's ear pain is likely due to eustachian tube dysfunction.  Will defer to ENT for further recommendations regarding treatment as they are following as above last week with recommendations to continue Zyrtec and Flonase daily.  Exam does not suggest current active otitis media or otitis externa the patient does not have systemic signs of viral or bacterial illness.  Recommend continue supportive care with Tylenol, and to call her ENT doctor for further recommendations.  Patient has hearing evaluation scheduled and follow-up with ENT afterwards.  Patient agreeable to plan and answered all questions.    Return if symptoms worsen or fail to improve.  Celine Mans, MD [facility]

## 2023-02-15 ENCOUNTER — Ambulatory Visit: Payer: Medicaid Other | Attending: Otolaryngology | Admitting: Audiology

## 2023-02-15 DIAGNOSIS — H9011 Conductive hearing loss, unilateral, right ear, with unrestricted hearing on the contralateral side: Secondary | ICD-10-CM | POA: Diagnosis not present
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
## 2023-02-22 ENCOUNTER — Ambulatory Visit (INDEPENDENT_AMBULATORY_CARE_PROVIDER_SITE_OTHER): Payer: Medicaid Other | Admitting: Otolaryngology

## 2023-02-22 VITALS — BP 119/70 | HR 101

## 2023-02-22 DIAGNOSIS — H9011 Conductive hearing loss, unilateral, right ear, with unrestricted hearing on the contralateral side: Secondary | ICD-10-CM

## 2023-02-22 DIAGNOSIS — J3089 Other allergic rhinitis: Secondary | ICD-10-CM | POA: Diagnosis not present

## 2023-02-22 DIAGNOSIS — H7291 Unspecified perforation of tympanic membrane, right ear: Secondary | ICD-10-CM | POA: Diagnosis not present

## 2023-02-22 DIAGNOSIS — R0981 Nasal congestion: Secondary | ICD-10-CM | POA: Diagnosis not present

## 2023-02-22 DIAGNOSIS — H6993 Unspecified Eustachian tube disorder, bilateral: Secondary | ICD-10-CM | POA: Diagnosis not present
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
## 2023-03-20 DIAGNOSIS — H7291 Unspecified perforation of tympanic membrane, right ear: Secondary | ICD-10-CM | POA: Diagnosis not present

## 2023-04-03 ENCOUNTER — Ambulatory Visit: Payer: Medicaid Other | Admitting: Neurology

## 2023-04-03 ENCOUNTER — Encounter: Payer: Self-pay | Admitting: Neurology

## 2023-04-03 VITALS — BP 119/64 | HR 93 | Ht 62.0 in | Wt 165.6 lb

## 2023-04-03 DIAGNOSIS — G43009 Migraine without aura, not intractable, without status migrainosus: Secondary | ICD-10-CM

## 2023-04-03 NOTE — Patient Instructions (Addendum)
Always good to see you. Continue to monitor migraines, may try restarting magnesium and riboflavin (vitamin B2) supplements if they increase in frequency.   Let me know if you need a migraine cocktail, and if ineffective, we can do a course of steroids. Follow-up in 1 year, call for any changes.
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
## 2023-04-10 ENCOUNTER — Other Ambulatory Visit: Payer: Self-pay

## 2023-04-10 ENCOUNTER — Encounter (HOSPITAL_BASED_OUTPATIENT_CLINIC_OR_DEPARTMENT_OTHER): Payer: Self-pay | Admitting: Emergency Medicine

## 2023-04-10 ENCOUNTER — Emergency Department (HOSPITAL_BASED_OUTPATIENT_CLINIC_OR_DEPARTMENT_OTHER)
Admission: EM | Admit: 2023-04-10 | Discharge: 2023-04-10 | Disposition: A | Discharge status: Home / Self Care | Payer: Medicaid Other | Attending: Emergency Medicine | Admitting: Emergency Medicine

## 2023-04-10 ENCOUNTER — Telehealth: Payer: Self-pay | Admitting: Neurology

## 2023-04-10 DIAGNOSIS — G43809 Other migraine, not intractable, without status migrainosus: Secondary | ICD-10-CM

## 2023-04-10 DIAGNOSIS — G43009 Migraine without aura, not intractable, without status migrainosus: Secondary | ICD-10-CM | POA: Diagnosis not present

## 2023-04-10 DIAGNOSIS — R519 Headache, unspecified: Secondary | ICD-10-CM | POA: Diagnosis present

## 2023-04-10 LAB — CBC WITH DIFFERENTIAL/PLATELET
Abs Immature Granulocytes: 0.03 10*3/uL (ref 0.00–0.07)
Basophils Absolute: 0 10*3/uL (ref 0.0–0.1)
Basophils Relative: 0 %
Eosinophils Absolute: 0.1 10*3/uL (ref 0.0–0.5)
Eosinophils Relative: 1 %
HCT: 39 % (ref 36.0–46.0)
Hemoglobin: 13.3 g/dL (ref 12.0–15.0)
Immature Granulocytes: 0 %
Lymphocytes Relative: 30 %
Lymphs Abs: 3.3 10*3/uL (ref 0.7–4.0)
MCH: 30.6 pg (ref 26.0–34.0)
MCHC: 34.1 g/dL (ref 30.0–36.0)
MCV: 89.9 fL (ref 80.0–100.0)
Monocytes Absolute: 0.7 10*3/uL (ref 0.1–1.0)
Monocytes Relative: 7 %
Neutro Abs: 6.8 10*3/uL (ref 1.7–7.7)
Neutrophils Relative %: 62 %
Platelets: 275 10*3/uL (ref 150–400)
RBC: 4.34 MIL/uL (ref 3.87–5.11)
RDW: 11.6 % (ref 11.5–15.5)
WBC: 11 10*3/uL — ABNORMAL HIGH (ref 4.0–10.5)
nRBC: 0 % (ref 0.0–0.2)

## 2023-04-10 LAB — HCG, SERUM, QUALITATIVE: Preg, Serum: NEGATIVE

## 2023-04-10 LAB — BASIC METABOLIC PANEL
Anion gap: 9 (ref 5–15)
BUN: 11 mg/dL (ref 6–20)
CO2: 25 mmol/L (ref 22–32)
Calcium: 9 mg/dL (ref 8.9–10.3)
Chloride: 101 mmol/L (ref 98–111)
Creatinine, Ser: 0.72 mg/dL (ref 0.44–1.00)
GFR, Estimated: >60 mL/min (ref >60)
Glucose, Bld: 90 mg/dL (ref 70–99)
Potassium: 3.5 mmol/L (ref 3.5–5.1)
Sodium: 135 mmol/L (ref 135–145)

## 2023-04-10 MED ORDER — ONDANSETRON HCL 4 MG/2ML IJ SOLN
4.0000 mg | Freq: Once | INTRAMUSCULAR | Status: AC
  Administered 2023-04-10: 4 mg via INTRAVENOUS
  Filled 2023-04-10: qty 2

## 2023-04-10 MED ORDER — DEXAMETHASONE SODIUM PHOSPHATE 10 MG/ML IJ SOLN
10.0000 mg | Freq: Once | INTRAMUSCULAR | Status: AC
  Administered 2023-04-10: 10 mg via INTRAVENOUS
  Filled 2023-04-10: qty 1

## 2023-04-10 MED ORDER — SODIUM CHLORIDE 0.9 % IV BOLUS
1000.0000 mL | Freq: Once | INTRAVENOUS | Status: AC
  Administered 2023-04-10: 1000 mL via INTRAVENOUS

## 2023-04-10 MED ORDER — DIPHENHYDRAMINE HCL 50 MG/ML IJ SOLN
50.0000 mg | Freq: Once | INTRAMUSCULAR | Status: AC
  Administered 2023-04-10: 50 mg via INTRAVENOUS
  Filled 2023-04-10: qty 1

## 2023-04-10 MED ORDER — KETOROLAC TROMETHAMINE 30 MG/ML IJ SOLN
30.0000 mg | Freq: Once | INTRAMUSCULAR | Status: AC
  Administered 2023-04-10: 30 mg via INTRAVENOUS
  Filled 2023-04-10: qty 1

## 2023-04-10 NOTE — Telephone Encounter (Signed)
Patient wants to know if she can come today for a cocktail shot. I let her know she may not be able to today. She said if she can't today, can she come tomorrow

## 2023-04-10 NOTE — ED Provider Notes (Signed)
[facility]ERGENCY DEPARTMENT AT [facility] Provider Note   CSN: [id number] Arrival date & time: 04/10/23  2006     History  Chief Complaint  Patient presents with   Migraine    MAETTA BRANDSTETTER is a 32 y.o. female history of migraines here presenting with headache.  Patient states that she has headache for the last 2 days.  She states that this is typical her previous migraine.  Patient has nausea but no vomiting.  Denies any focal weakness or trouble speaking.  Patient states that right now she is not on any medications for migraines.  She states that she went to ER previously and received Zofran and Benadryl and Toradol and steroids and fluids and that usually resolves her headache for several weeks.  Patient follows with Dr. Karel Jarvis from neurology  The history is provided by the patient.       Home Medications Prior to Admission medications   Medication Sig Start Date End Date Taking? Authorizing Provider  cetirizine (ZYRTEC) 10 MG tablet Take 1 tablet (10 mg total) by mouth daily. Patient taking differently: Take 20 mg by mouth 2 (two) times daily. 02/01/23   Ashok Croon, MD  EPIPEN 2-PAK 0.3 MG/0.3ML SOAJ injection Inject 0.3 mg into the muscle once as needed for anaphylaxis. 07/24/22   Ellamae Sia, DO  famotidine (PEPCID) 20 MG tablet TAKE 1 TABLET(20 MG) BY MOUTH TWICE DAILY 04/20/22   Ellamae Sia, DO  fluticasone [facility]) 50 MCG/ACT nasal spray Place 2 sprays into both nostrils daily. 02/01/23   Ashok Croon, MD  VENTOLIN HFA 108 (90 Base) MCG/ACT inhaler INHALE 2 PUFFS INTO THE LUNGS EVERY 4 HOURS AS NEEDED FOR WHEEZING OR SHORTNESS OF BREATH 07/05/22   Nehemiah Settle, FNP  nortriptyline (PAMELOR) 25 MG capsule Take 1 capsule (25 mg total) by mouth at bedtime. 04/09/20 08/06/20  Van Clines, MD      Allergies    Adhesive [tape], Bee venom, Reglan [metoclopramide], and Amoxil [amoxicillin]    Review of Systems   Review of Systems  Neurological:   Positive for headaches.  All other systems reviewed and are negative.   Physical Exam Updated Vital Signs BP 132/88 (BP Location: Right Arm)   Pulse 100   Temp 98.4 F (36.9 C)   Resp 20   LMP 08/14/2016 (Exact Date)   SpO2 98%  Physical Exam Vitals and nursing note reviewed.  Constitutional:      Comments: Uncomfortable  HENT:     Head: Normocephalic.     Nose: Nose normal.     Mouth/Throat:     Mouth: Mucous membranes are dry.  Eyes:     Extraocular Movements: Extraocular movements intact.     Pupils: Pupils are equal, round, and reactive to light.  Cardiovascular:     Rate and Rhythm: Normal rate and regular rhythm.     Pulses: Normal pulses.     Heart sounds: Normal heart sounds.  Pulmonary:     Effort: Pulmonary effort is normal.     Breath sounds: Normal breath sounds.  Abdominal:     General: Abdomen is flat.     Palpations: Abdomen is soft.  Musculoskeletal:        General: Normal range of motion.     Cervical back: Normal range of motion and neck supple.  Skin:    General: Skin is warm.     Capillary Refill: Capillary refill takes less than 2 seconds.  Neurological:  General: No focal deficit present.     Mental Status: She is oriented to person, place, and time.     Comments: Cranial nerves II to XII intact.  Patient has normal strength and sensation bilateral arms and legs.  Psychiatric:        Mood and Affect: Mood normal.        Behavior: Behavior normal.     ED Results / Procedures / Treatments   Labs (all labs ordered are listed, but only abnormal results are displayed) Labs Reviewed  CBC WITH DIFFERENTIAL/PLATELET  BASIC METABOLIC PANEL  HCG, SERUM, QUALITATIVE    EKG None  Radiology No results found.  Procedures Procedures    Medications Ordered in ED Medications  sodium chloride 0.9 % bolus 1,000 mL (has no administration in time range)  diphenhydrAMINE (BENADRYL) injection 50 mg (has no administration in time range)   ondansetron (ZOFRAN) injection 4 mg (has no administration in time range)  ketorolac (TORADOL) 30 MG/ML injection 30 mg (has no administration in time range)  dexamethasone (DECADRON) injection 10 mg (has no administration in time range)    ED Course/ Medical Decision Making/ A&P                                 Medical Decision Making SHARRYN NAKADA is a 32 y.o. female here presenting with headaches.  Patient has history of migraines.  Patient has worsening headaches which is typical of her migraines.  Patient has unremarkable neuroexam.  Plan to get CBC and BMP and give migraine cocktail with IV fluids and Benadryl and Zofran and Toradol and steroids.   10:23 PM Reviewed patient's labs and they were unremarkable.  Patient was given migraine cocktail and felt better.  Stable for discharge.  She has follow-up with neurology  Problems Addressed: Other migraine without status migrainosus, not intractable: acute illness or injury  Amount and/or Complexity of Data Reviewed Labs: ordered. Decision-making details documented in ED Course.  Risk Prescription drug management.    Final Clinical Impression(s) / ED Diagnoses Final diagnoses:  None    Rx / DC Orders ED Discharge Orders     None         Charlynne Pander, MD 04/10/23 2224

## 2023-04-10 NOTE — ED Triage Notes (Signed)
Migraine started yesterday. Not relieved with home otc pain medications Hx migraine, normally requires "migraine cocktail"

## 2023-04-10 NOTE — Telephone Encounter (Signed)
Pt called no answer left a voice mail to call the office back

## 2023-04-10 NOTE — Discharge Instructions (Addendum)
You need to see Dr. Karel Jarvis for follow-up.  Continue Tylenol or Motrin for headache  Return to ER if you have worse headache or fever or neck pain

## 2023-04-10 NOTE — Telephone Encounter (Signed)
That is fine, thanks

## 2023-04-11 ENCOUNTER — Telehealth: Payer: Self-pay | Admitting: Neurology

## 2023-04-11 MED ORDER — PREDNISONE 10 MG (21) PO TBPK: ORAL_TABLET | ORAL | 0 refills | Status: DC

## 2023-04-11 NOTE — Telephone Encounter (Signed)
Caller stated she went to ER last night for headache and received cocktail. Stated the headache is back this morning and is requesting call from nurse. Would like an earlier appointment

## 2023-04-11 NOTE — Telephone Encounter (Signed)
Pt went to ER for headache cocktail see other phone note pt wants to come for sooner appointment

## 2023-04-11 NOTE — Telephone Encounter (Signed)
Pt called this is day 3 of having headache she would like to try the steroid taper to break the headache she said it started when she turned on her wax warmer and forgot to get a piece of styrofoam out of it and it burned

## 2023-04-11 NOTE — Addendum Note (Signed)
Addended by: Dimas Chyle on: 04/11/2023 10:14 AM   Modules accepted: Orders

## 2023-04-11 NOTE — Telephone Encounter (Signed)
No need for earlier appt, she contacts [place] when she needs a cocktail, or knows to call if HA continues and can send steroid taper. Thanks

## 2023-05-11 ENCOUNTER — Ambulatory Visit (INDEPENDENT_AMBULATORY_CARE_PROVIDER_SITE_OTHER): Payer: Medicaid Other | Admitting: Family Medicine

## 2023-05-11 VITALS — BP 110/78 | HR 99 | Ht 62.0 in | Wt 160.4 lb

## 2023-05-11 DIAGNOSIS — M62838 Other muscle spasm: Secondary | ICD-10-CM

## 2023-05-11 DIAGNOSIS — Z23 Encounter for immunization: Secondary | ICD-10-CM | POA: Diagnosis not present

## 2023-05-11 NOTE — Progress Notes (Signed)
    SUBJECTIVE:   CHIEF COMPLAINT / HPI:   Neck pain Patient states that she woke up this morning feeling stiffness in her neck and upper back Has had symptoms like this in the past which resolved on their own.  Once she used Flexeril which provided some benefit States that her symptoms have improved/resolved since onset and she is now able to move her neck in all directions without much pain.  She does not wish to use any medications now  PERTINENT  PMH / PSH: N/A  OBJECTIVE:   LMP 08/14/2016 (Exact Date)   General: NAD, pleasant, able to participate in exam Respiratory: No respiratory distress Skin: warm and dry, no rashes noted Psych: Normal affect and mood  Neck: No gross deformity Nontender to palpation throughout the cervical spine and occiput Full range of motion without pain, no pain with movement against resistance    ASSESSMENT/PLAN:   Assessment & Plan Trapezius muscle spasm Symptoms improving and nearly resolved.  Upon shared decision making decided against using any medications at this time.  Patient is okay with continuing supportive care, discussed massages and heat therapy.  Discussed return precautions   Vonna Drafts, MD [facility]

## 2023-07-06 ENCOUNTER — Ambulatory Visit
Admission: EM | Admit: 2023-07-06 | Discharge: 2023-07-06 | Disposition: A | Discharge status: Home / Self Care | Payer: Medicaid Other

## 2023-07-06 DIAGNOSIS — S161XXA Strain of muscle, fascia and tendon at neck level, initial encounter: Secondary | ICD-10-CM

## 2023-07-06 MED ORDER — PREDNISONE 20 MG PO TABS: 40.0000 mg | ORAL_TABLET | Freq: Every day | ORAL | 0 refills | Status: AC

## 2023-07-06 NOTE — ED Triage Notes (Signed)
"  I have pain in my neck on the left side, more when I am touching area or moving a certain way". No injury. No sore throat "scratchy when I first got up". No fever. No cold symptoms.

## 2023-07-06 NOTE — ED Provider Notes (Signed)
[name]    CSN: [id number] Arrival date & time: 07/06/23  4034      History   Chief Complaint Chief Complaint  Patient presents with   Neck Pain    HPI Michelle [name] is a 32 y.o. female.   Patient here today for evaluation of left anterior neck pain that started last night.  She reports that she has pain with certain movements of her neck.  Yawning also causes more pain.  She denies any pain with swallowing.  She has not any fever.  She has not had any associated swelling.  She does not report any known injury.  The history is provided by the patient.  Neck Pain Associated symptoms: no fever and no headaches     Past Medical History:  Diagnosis Date   Anxiety    Asthma    Bipolar disorder (HCC)    no current med.   Depression    no current med.   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   Migraine    Nephrolithiasis    PTSD (post-traumatic stress disorder)    PTSD (post-traumatic stress disorder)    Pyelonephritis 01/16/2019   Seizures (HCC)    Tachycardia    TMJ (dislocation of temporomandibular joint)    Vertigo     Patient Active Problem List   Diagnosis Date Noted   Perforation of tympanic membrane 02/06/2023   Ear pain, right 01/23/2023   Injection site reaction 08/09/2022   Penicillin allergy 03/22/2022   Sinus infection 01/02/2022   Left lower quadrant abdominal pain 11/07/2021   Epiploic appendagitis 10/18/2021   Sacroiliac joint dysfunction of right side 10/17/2021   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   Seborrheic dermatitis 09/22/2021   Other fatigue 09/11/2021   Hair loss 09/11/2021    Constipation 08/30/2021   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   Dizziness 02/10/2021   Hypermobility syndrome 10/04/2020   Encounter for pre-operative examination 08/17/2020   Nipple discharge in female 06/04/2020   GERD (gastroesophageal reflux disease) 06/04/2020   Labral tear of right hip joint 04/12/2020   Right hip pain 02/03/2020   Finger pain, right 10/23/2019   Eustachian tube dysfunction, bilateral 12/16/2018   Bee sting allergy 11/06/2018   Allergic reaction 11/04/2018   Mild intermittent asthma without complication 11/04/2018   Chronic rhinitis 11/04/2018   Chronic urticaria 10/04/2018   Rash 11/16/2017   Easy bruising 11/16/2017   Anxiety 10/27/2016   SUI (stress urinary incontinence, female) 04/10/2016   Overactive bladder 04/10/2016   Migraine 02/10/2016   Lipoma 08/23/2015   PTSD 03/02/2010   Tobacco use disorder 02/07/2008   DSORD Ihor Gully, 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    Past Surgical History:  Procedure Laterality Date   ABDOMINAL HYSTERECTOMY     ADENOIDECTOMY, TONSILLECTOMY AND MYRINGOTOMY WITH TUBE PLACEMENT  1990's   APPENDECTOMY     CESAREAN SECTION  06/20/2012   Procedure: CESAREAN SECTION;  Surgeon: Allie Bossier, MD;  Location: [facility];  Service: Obstetrics;  Laterality: N/A;   HIP SURGERY     LAPAROSCOPIC APPENDECTOMY  07/25/2012   Procedure: APPENDECTOMY  LAPAROSCOPIC;  Surgeon: Liz Malady, MD;  Location: [facility] OR;  Service: General;  Laterality: N/A;   LAPAROSCOPIC TUBAL LIGATION Bilateral 08/04/2014   Procedure: BILATERAL LAPAROSCOPIC TUBAL LIGATION;  Surgeon: Adam Phenix, MD;  Location: [facility];  Service: Gynecology;  Laterality: Bilateral;   LIPOMA EXCISION N/A 09/16/2015   Procedure: EXCISION LIPOMA LUMBAR REGION;  Surgeon: Manus Rudd, MD;  Location: [facility];  Service: General;  Laterality: N/A;   TONSILLECTOMY     WISDOM TOOTH EXTRACTION      OB History     Gravida  3   Para  3   Term  2   Preterm  1    AB  0   Living  3      SAB  0   IAB  0   Ectopic  0   Multiple  0   Live Births  1        Obstetric Comments  C-Section Indication: Non-reassuring fetal tracing, growth delay & marked proteinuria & Pre-Eclampsia Eclamptic Seizure during C-section delivery          Home Medications    Prior to Admission medications   Medication Sig Start Date End Date Taking? Authorizing Provider  diphenhydrAMINE (BENADRYL) 25 mg capsule Take 25 mg by mouth every 6 (six) hours as needed.   Yes [provider]  predniSONE (DELTASONE) 20 MG tablet Take 2 tablets (40 mg total) by mouth daily with breakfast for 5 days. 07/06/23 07/11/23 Yes Tomi Bamberger, PA-C  cetirizine (ZYRTEC) 10 MG tablet Take 1 tablet (10 mg total) by mouth daily. Patient taking differently: Take 20 mg by mouth 2 (two) times daily. 02/01/23   Ashok Croon, MD  EPIPEN 2-PAK 0.3 MG/0.3ML SOAJ injection Inject 0.3 mg into the muscle once as needed for anaphylaxis. 07/24/22   Ellamae Sia, DO  famotidine (PEPCID) 20 MG tablet TAKE 1 TABLET(20 MG) BY MOUTH TWICE DAILY 04/20/22   Ellamae Sia, DO  fluticasone [facility]) 50 MCG/ACT nasal spray Place 2 sprays into both nostrils daily. 02/01/23   Ashok Croon, MD  VENTOLIN HFA 108 (90 Base) MCG/ACT inhaler INHALE 2 PUFFS INTO THE LUNGS EVERY 4 HOURS AS NEEDED FOR WHEEZING OR SHORTNESS OF BREATH 07/05/22   Nehemiah Settle, FNP  nortriptyline (PAMELOR) 25 MG capsule Take 1 capsule (25 mg total) by mouth at bedtime. 04/09/20 08/06/20  Van Clines, MD    Family History Family History  Problem Relation Age of Onset   Diabetes Mother    Anesthesia problems Mother        hard to wake up post-op   Atrial fibrillation Mother    Congestive Heart Failure Mother    Diabetes Father    Heart disease Father    Diabetes Sister    Anesthesia problems Sister        hard to wake up post-op   Colon cancer Maternal Grandfather    Breast cancer Paternal Grandmother     Colon cancer Paternal Grandfather    Colon polyps Maternal Aunt    Eczema Neg Hx    Allergic rhinitis Neg Hx    Asthma Neg Hx     Social History Social History   Tobacco Use   Smoking status: Former    Current packs/day: 0.00    Types: Cigarettes    Quit date: 03/18/2003    Years since quitting: 20.3    Passive exposure: Never   Smokeless tobacco: Never  Vaping Use  Vaping status: Every Day   Last attempt to quit: 01/28/2023   Substances: Nicotine, Flavoring  Substance Use Topics   Alcohol use: No   Drug use: No     Allergies   Adhesive [tape], Bee venom, Reglan [metoclopramide], and Amoxil [amoxicillin]   Review of Systems Review of Systems  Constitutional:  Negative for chills and fever.  HENT:  Negative for congestion, postnasal drip, rhinorrhea, sore throat and trouble swallowing.   Eyes:  Negative for discharge and redness.  Gastrointestinal:  Negative for abdominal pain, nausea and vomiting.  Musculoskeletal:  Positive for myalgias and neck pain.  Neurological:  Negative for headaches.     Physical Exam Triage Vital Signs ED Triage Vitals  Encounter Vitals Group     BP 07/06/23 1048 110/74     Systolic BP Percentile --      Diastolic BP Percentile --      Pulse Rate 07/06/23 1048 92     Resp 07/06/23 1048 18     Temp 07/06/23 1048 98.2 F (36.8 C)     Temp Source 07/06/23 1048 Oral     SpO2 07/06/23 1048 98 %     Weight 07/06/23 1046 158 lb (71.7 kg)     Height 07/06/23 1046 5\' 2"  (1.575 m)     Head Circumference --      Peak Flow --      Pain Score 07/06/23 1043 8     Pain Loc --      Pain Education --      Exclude from Growth Chart --    No data found.  Updated Vital Signs BP 110/74 (BP Location: Right Arm)   Pulse 92   Temp 98.2 F (36.8 C) (Oral)   Resp 18   Ht 5\' 2"  (1.575 m)   Wt 158 lb (71.7 kg)   LMP 08/14/2016 (Exact Date)   SpO2 98%   BMI 28.90 kg/m   Visual Acuity Right Eye Distance:   Left Eye Distance:   Bilateral  Distance:    Right Eye Near:   Left Eye Near:    Bilateral Near:     Physical Exam Vitals and nursing note reviewed.  Constitutional:      General: She is not in acute distress.    Appearance: Normal appearance. She is not ill-appearing.  HENT:     Head: Normocephalic and atraumatic.     Mouth/Throat:     Mouth: Mucous membranes are moist.     Pharynx: Oropharynx is clear. No oropharyngeal exudate or posterior oropharyngeal erythema.  Eyes:     Conjunctiva/sclera: Conjunctivae normal.  Cardiovascular:     Rate and Rhythm: Normal rate.  Pulmonary:     Effort: Pulmonary effort is normal.  Musculoskeletal:     Cervical back: Normal range of motion and neck supple. No rigidity.  Lymphadenopathy:     Cervical: No cervical adenopathy.  Neurological:     Mental Status: She is alert.  Psychiatric:        Mood and Affect: Mood normal.        Behavior: Behavior normal.        Thought Content: Thought content normal.      UC Treatments / Results  Labs (all labs ordered are listed, but only abnormal results are displayed) Labs Reviewed - No data to display  EKG   Radiology No results found.  Procedures Procedures (including critical care time)  Medications Ordered in UC Medications - No data to display  Initial  Impression / Assessment and Plan / UC Course  I have reviewed the triage vital signs and the nursing notes.  Pertinent labs & imaging results that were available during my care of the patient were reviewed by me and considered in my medical decision making (see chart for details).    Suspect likely muscle strain and recommended steroid burst.  Discussed that this might lower the immune response and that should bacterial infection be present this may worsen symptoms.  At this time there is no clear concern for infection and patient agrees to proceed with steroid burst.  Recommended follow-up with any development of new symptoms or further concerns.  Final  Clinical Impressions(s) / UC Diagnoses   Final diagnoses:  Neck muscle strain, initial encounter   Discharge Instructions   None    ED Prescriptions     Medication Sig Dispense Auth. Provider   predniSONE (DELTASONE) 20 MG tablet Take 2 tablets (40 mg total) by mouth daily with breakfast for 5 days. 10 tablet Tomi Bamberger, PA-C      PDMP not reviewed this encounter.   Tomi Bamberger, PA-C 07/06/23 1112

## 2023-11-14 ENCOUNTER — Telehealth: Payer: Self-pay | Admitting: Neurology

## 2023-11-14 ENCOUNTER — Ambulatory Visit (INDEPENDENT_AMBULATORY_CARE_PROVIDER_SITE_OTHER)

## 2023-11-14 DIAGNOSIS — G43009 Migraine without aura, not intractable, without status migrainosus: Secondary | ICD-10-CM

## 2023-11-14 MED ORDER — KETOROLAC TROMETHAMINE 60 MG/2ML IM SOLN
60.0000 mg | Freq: Once | INTRAMUSCULAR | Status: AC
Start: 2023-11-14 — End: 2023-11-14
  Administered 2023-11-14: 60 mg via INTRAMUSCULAR

## 2023-11-14 MED ORDER — DIPHENHYDRAMINE HCL 50 MG/ML IJ SOLN
50.0000 mg | Freq: Once | INTRAMUSCULAR | Status: AC
Start: 2023-11-14 — End: 2023-11-14
  Administered 2023-11-14: 25 mg via INTRAMUSCULAR

## 2023-11-14 NOTE — Progress Notes (Signed)
 Per patient Reglan  she had a server side effect.  Per Dr. Jaymes Mew patient to get Benadryl  and Toradol .   Patient advised she will be getting Benadryl  25 mg, and Toradol  60 mg.

## 2023-11-14 NOTE — Telephone Encounter (Signed)
 Ok for headache cocktail, thanks

## 2023-11-14 NOTE — Telephone Encounter (Signed)
 Pt. Would like Headache cocktail today, please call back with Dr denial or approval

## 2023-11-14 NOTE — Telephone Encounter (Signed)
 Pt called an informed that Dr Ty Gales said that its ok for her to get headache cocktail and that its ok for her to get it, she needs to be here by 4:15

## 2023-11-29 ENCOUNTER — Ambulatory Visit (INDEPENDENT_AMBULATORY_CARE_PROVIDER_SITE_OTHER): Admitting: Sports Medicine

## 2023-11-29 ENCOUNTER — Encounter: Payer: Self-pay | Admitting: Sports Medicine

## 2023-11-29 ENCOUNTER — Ambulatory Visit (HOSPITAL_BASED_OUTPATIENT_CLINIC_OR_DEPARTMENT_OTHER)
Admission: RE | Admit: 2023-11-29 | Discharge: 2023-11-29 | Disposition: A | Discharge status: Home / Self Care | Source: Ambulatory Visit | Attending: Sports Medicine | Admitting: Sports Medicine

## 2023-11-29 VITALS — BP 120/80 | Ht 62.0 in | Wt 154.0 lb

## 2023-11-29 DIAGNOSIS — M24851 Other specific joint derangements of right hip, not elsewhere classified: Secondary | ICD-10-CM | POA: Insufficient documentation

## 2023-11-29 DIAGNOSIS — M25551 Pain in right hip: Secondary | ICD-10-CM | POA: Diagnosis not present

## 2023-11-29 NOTE — Progress Notes (Signed)
   Subjective:    Patient ID: Michelle [name], female    DOB: 1990/11/02, 33 y.o.   MRN: [id number]  HPI chief complaint: Right hip pain  Michelle [name] presents today complaining of 2 months of right hip pain.  No injury that she can recall.  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  She also has a history of a previous right hip labral repair done in 2022 by Dr. Darene Economy.  That injury was also atraumatic and felt to be due in part to her Ehlers-Danlos.  She did well postoperatively up until about 2 months ago.  Her pain then began to return.  It is deep within the groin and will radiate to the posterior hip.  It is worse with weightbearing such as walking and standing.  Resolves with sitting.  She is tried Tylenol  but it has not been helpful.  She is unable to take oral NSAIDs.  She does endorse painful catching and popping as well as a tight feeling in the right hip.  All of the symptoms are identical to what she experienced with her previous labral tear.  Past medical history reviewed Medications reviewed Allergies reviewed   Review of Systems As above    Objective:   Physical Exam  Right hip: Good range of motion.  No tenderness to palpation.  Markedly positive FADIR.  No palpable popping of the anterior or lateral hip with active flexion and extension.  Right hip x-rays show no evidence of arthropathy and no acute findings.    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 Darene Economy in 2022 Ehlers-Danlos  MRI arthrogram of the right hip to rule out recurrent labral tear.  I will follow-up with her via MyChart with those results once available.  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  This will be done at the [facility] outpatient PT facility in [place].

## 2023-12-04 ENCOUNTER — Ambulatory Visit (INDEPENDENT_AMBULATORY_CARE_PROVIDER_SITE_OTHER)

## 2023-12-04 ENCOUNTER — Encounter: Payer: Self-pay | Admitting: Sports Medicine

## 2023-12-04 ENCOUNTER — Ambulatory Visit (INDEPENDENT_AMBULATORY_CARE_PROVIDER_SITE_OTHER): Admitting: Sports Medicine

## 2023-12-04 DIAGNOSIS — M25551 Pain in right hip: Secondary | ICD-10-CM | POA: Diagnosis not present

## 2023-12-04 DIAGNOSIS — S73191D Other sprain of right hip, subsequent encounter: Secondary | ICD-10-CM

## 2023-12-04 DIAGNOSIS — M24851 Other specific joint derangements of right hip, not elsewhere classified: Secondary | ICD-10-CM

## 2023-12-04 MED ORDER — GADOBUTROL 1 MMOL/ML IV SOLN
1.0000 mL | Freq: Once | INTRAVENOUS | Status: AC | PRN
  Administered 2023-12-04: 1 mL via INTRAVENOUS

## 2023-12-04 MED ORDER — TRIAMCINOLONE ACETONIDE 40 MG/ML IJ SUSP
40.0000 mg | Freq: Once | INTRAMUSCULAR | Status: AC
Start: 2023-12-04 — End: 2023-12-04
  Administered 2023-12-04: 40 mg via INTRAMUSCULAR

## 2023-12-04 NOTE — Progress Notes (Signed)
    Procedures performed today:    Procedure: Real-time Ultrasound Guided gadolinium contrast injection of the right hip joint Device: Samsung HS60  Verbal informed consent obtained.  Time-out conducted.  Noted no overlying erythema, induration, or other signs of local infection.  Skin prepped in a sterile fashion.  Local anesthesia: Topical Ethyl chloride.  With sterile technique and under real time ultrasound guidance: Noted femoral head spurring consistent with osteoarthritis, mild effusion, 22-gauge spinal needle advanced to the femoral head/neck junction, contacted bone, I injected 1 cc kenalog  40, 2 cc lidocaine , 2 cc bupivacaine , syringe switched and 0.1 cc gadolinium injected, syringe again switched and 10 cc sterile saline used to fully distend the joint. Joint visualized and capsule seen distending confirming intra-articular placement of contrast material and medication. Completed without difficulty  Advised to call if fevers/chills, erythema, induration, drainage, or persistent bleeding.  Images permanently stored in PACS Impression: Technically successful ultrasound guided gadolinium contrast injection for MR arthrography.  Please see separate MR arthrogram report.  Independent interpretation of notes and tests performed by another provider:   None.  Brief History, Exam, Impression, and Recommendations:    Labral tear of right hip joint Injection performed today for MR arthrography, further management per primary treating provider. 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    ____________________________________________ Joselyn Nicely. Sandy Crumb, M.D., ABFM., CAQSM., AME. Primary Care and Sports Medicine [facility]  Adjunct Professor of [facility] Medicine  [employer]  Restaurant manager, fast food

## 2023-12-04 NOTE — Assessment & Plan Note (Signed)
 Injection performed today for MR arthrography, further management per primary treating provider. 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

## 2023-12-05 ENCOUNTER — Ambulatory Visit: Payer: Self-pay

## 2023-12-05 ENCOUNTER — Telehealth: Admitting: Physician Assistant

## 2023-12-05 DIAGNOSIS — R232 Flushing: Secondary | ICD-10-CM | POA: Diagnosis not present

## 2023-12-05 DIAGNOSIS — T380X5A Adverse effect of glucocorticoids and synthetic analogues, initial encounter: Secondary | ICD-10-CM | POA: Diagnosis not present

## 2023-12-05 NOTE — Telephone Encounter (Signed)
 Chief Complaint: flush face Symptoms: flush face, redness Frequency: x 1 day Pertinent Negatives: Patient denies swelling Disposition: [] ED /[x] Urgent Care (no appt availability in office) / [] Appointment(In office/virtual)/ [x]  [facility]/ [] Home Care/ [] Refused Recommended Disposition /[] [place] [facility]/ []  Follow-up with PCP  Additional Notes: pt states that red flush face and warm to touch started this morning. States that she took a benadryl  and it did not help.   Copied from CRM [id number]. Topic: Clinical - Red Word Triage >> Dec 05, 2023  2:06 PM Adrianna P wrote: Red Word that prompted transfer to Nurse Triage: patient took benadryl  about an hour ago and it has not helped for allergic reaction Reason for Disposition  Taking new prescription medicine  (Exceptions: finished taking new prescription antibiotic OR questions about flushing from niacin)  Protocols used: Rash - Widespread On Drugs-A-AH

## 2023-12-05 NOTE — Progress Notes (Signed)
 Virtual Visit Consent   Michelle [name], you are scheduled for a virtual visit with a [facility] provider today. Just as with appointments in the office, your consent must be obtained to participate. Your consent will be active for this visit and any virtual visit you may have with one of our providers in the next 365 days. If you have a MyChart account, a copy of this consent can be sent to you electronically.  As this is a virtual visit, video technology does not allow for your provider to perform a traditional examination. This may limit your provider's ability to fully assess your condition. If your provider identifies any concerns that need to be evaluated in person or the need to arrange testing (such as labs, EKG, etc.), we will make arrangements to do so. Although advances in technology are sophisticated, we cannot ensure that it will always work on either your end or our end. If the connection with a video visit is poor, the visit may have to be switched to a telephone visit. With either a video or telephone visit, we are not always able to ensure that we have a secure connection.  By engaging in this virtual visit, you consent to the provision of healthcare and authorize for your insurance to be billed (if applicable) for the services provided during this visit. Depending on your insurance coverage, you may receive a charge related to this service.  I need to obtain your verbal consent now. Are you willing to proceed with your visit today? Michelle [name] has provided verbal consent on 12/05/2023 for a virtual visit (video or telephone). Angelia Kelp, PA-C  Date: 12/05/2023 3:37 PM   Virtual Visit via Video Note   I, Angelia Kelp, connected with  Michelle [name]  ([id number], 09-16-1990) on 12/05/23 at  3:00 PM EDT by a video-enabled telemedicine application and verified that I am speaking with the correct person using two identifiers.  Location: Patient: Virtual Visit Location  Patient: Home Provider: Virtual Visit Location Provider: Home Office   I discussed the limitations of evaluation and management by telemedicine and the availability of in person appointments. The patient expressed understanding and agreed to proceed.    History of Present Illness: Michelle [name] is a 33 y.o. who identifies as a female who was assigned female at birth, and is being seen today for possible allergic reaction. Had a Steroid intra-articular injection (Triamcinolone ) done with MRI yesterday for hip pain. Today she developed flushing of the face with warmth and redness. She denies any shortness of breath, difficulty breathing, swelling of the face/tongue/throat. She did take 2 Benadryl  and has started to have some improvements at this time, but mild symptoms persist.    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   Dizziness 02/10/2021   Hypermobility syndrome 10/04/2020   Encounter for pre-operative examination 08/17/2020   Nipple discharge in female 06/04/2020   GERD (gastroesophageal reflux disease) 06/04/2020   Labral tear of right hip joint 04/12/2020  Right hip pain 02/03/2020   Finger pain, right 10/23/2019   Eustachian tube dysfunction, bilateral 12/16/2018   Bee sting allergy  11/06/2018   Allergic reaction 11/04/2018   Mild intermittent asthma without complication 11/04/2018   Chronic rhinitis 11/04/2018   Chronic urticaria 10/04/2018   Rash 11/16/2017    Easy bruising 11/16/2017   Anxiety 10/27/2016   SUI (stress urinary incontinence, female) 04/10/2016   Overactive bladder 04/10/2016   Migraine 02/10/2016   Lipoma 08/23/2015   PTSD 03/02/2010   Tobacco use disorder 02/07/2008   DSORD BIPOLAR I, UNSPC, 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 (Patient taking differently: Take 20 mg by mouth 2 (two) times daily.), Disp: 30 tablet, Rfl: 11   diphenhydrAMINE  (BENADRYL ) 25 mg capsule, Take 25 mg by mouth every 6 (six) hours as needed., Disp: , Rfl:    EPIPEN  2-PAK 0.3 MG/0.3ML SOAJ injection, Inject 0.3 mg into the muscle once as needed for anaphylaxis., Disp: 2 each, Rfl: 1   famotidine  (PEPCID ) 20 MG tablet, TAKE 1 TABLET(20 MG) BY MOUTH TWICE DAILY, Disp: 60 tablet, Rfl: 5   fluticasone  (FLONASE ) 50 MCG/ACT nasal spray, Place 2 sprays into both nostrils daily., Disp: 16 g, Rfl: 6   VENTOLIN  HFA 108 (90 Base) MCG/ACT inhaler, INHALE 2 PUFFS INTO THE LUNGS EVERY 4 HOURS AS NEEDED FOR WHEEZING OR SHORTNESS OF BREATH, Disp: 18 g, Rfl: 1  Observations/Objective: Patient is well-developed, well-nourished in no acute distress.  Resting comfortably at home.  Head is normocephalic, atraumatic.  No labored breathing.  Speech is clear and coherent with logical content.  Patient is alert and oriented at baseline.    Assessment and Plan: 1. Flushing (Primary)  2. Adverse effect of cortisone  - Suspect Cortisone Flushing following recent Steroid  intra-articular injection - Advised to start a daily 24-hr non-drowsy antihistamine like Claritin , Zyrtec , or Allegra; She is going to start Zyrtec  since she has taken in the past and tolerates - Can add Benadryl  as needed for any breakthrough flushing symptoms - Add Famotidine  (Pepcid ) 20mg  once or twice daily - Discussed expectant course of 1-2 weeks but should improve over the next few days with decreased frequency - Cold compresses - Seek in person evaluation if worsening  Follow Up Instructions: I discussed the assessment and treatment plan with the patient. The patient was provided an opportunity to ask questions and all were answered. The patient agreed with the plan and demonstrated an understanding of the instructions.  A copy of instructions were sent to the patient via MyChart unless otherwise noted below.    The patient was advised to call back or seek an in-person evaluation if the symptoms worsen or if the condition fails to improve as anticipated.    Angelia Kelp, PA-C

## 2023-12-05 NOTE — Patient Instructions (Signed)
 Michelle [name], thank you for joining Angelia Kelp, PA-C for today's virtual visit.  While this provider is not your primary care provider (PCP), if your PCP is located in our provider database this encounter information will be shared with them immediately following your visit.   A [facility] MyChart account gives you access to today's visit and all your visits, tests, and labs performed at [facility] " click here if you don't have a [facility] MyChart account or go to [email or web address]  Consent: (Patient) Michelle [name] provided verbal consent for this virtual visit at the beginning of the encounter.  Current Medications:  Current Outpatient Medications:    cetirizine  (ZYRTEC ) 10 MG tablet, Take 1 tablet (10 mg total) by mouth daily. (Patient taking differently: Take 20 mg by mouth 2 (two) times daily.), Disp: 30 tablet, Rfl: 11   diphenhydrAMINE  (BENADRYL ) 25 mg capsule, Take 25 mg by mouth every 6 (six) hours as needed., Disp: , Rfl:    EPIPEN  2-PAK 0.3 MG/0.3ML SOAJ injection, Inject 0.3 mg into the muscle once as needed for anaphylaxis., Disp: 2 each, Rfl: 1   famotidine  (PEPCID ) 20 MG tablet, TAKE 1 TABLET(20 MG) BY MOUTH TWICE DAILY, Disp: 60 tablet, Rfl: 5   fluticasone  (FLONASE ) 50 MCG/ACT nasal spray, Place 2 sprays into both nostrils daily., Disp: 16 g, Rfl: 6   VENTOLIN  HFA 108 (90 Base) MCG/ACT inhaler, INHALE 2 PUFFS INTO THE LUNGS EVERY 4 HOURS AS NEEDED FOR WHEEZING OR SHORTNESS OF BREATH, Disp: 18 g, Rfl: 1   Medications ordered in this encounter:  No orders of the defined types were placed in this encounter.    *If you need refills on other medications prior to your next appointment, please contact your pharmacy*  Follow-Up: Call back or seek an in-person evaluation if the symptoms worsen or if the condition fails to improve as anticipated.   Virtual Care [phone]  Other Instructions A "cortisone flush," or flushing of  the face and chest, is a common side effect of corticosteroid injections, like cortisone shots. It typically occurs within the first few days after the injection and usually resolves on its own within 1-2 days. This flushing is believed to be due to a histamine -mediated response and is often explained to patients before the procedure.  Elaboration: What is it? Cortisone flush is a temporary redness and warmth of the face and chest that can occur after receiving a corticosteroid injection.  Why does it happen? It's thought to be related to the body's response to the steroid, potentially involving the release of histamine .  How long does it last? The flush usually appears within the first 24 hours after the injection and typically resolves within 1-2 days.  What can you do about it? There's not much that can be done to actively treat the flush itself. It will usually subside on its own as the body adjusts to the steroid.  Is it serious? No, facial flushing is a temporary and usually harmless side effect.  Other potential side effects: Besides flushing, other possible side effects of corticosteroid injections include pain at the injection site, temporary bruising, insomnia, mood changes, and increased appetite.  Important note: If you have diabetes, a cortisone injection may temporarily increase your blood sugar levels. Monitor your blood sugar levels closely and follow your doctor's instructions.    If you have been instructed to have an in-person evaluation today at a local Urgent Care facility, please use the link below. It  will take you to a list of all of our available [facility], including address, phone number and hours of operation. Please do not delay care.  [facility]  If you or a family member do not have a primary care provider, use the link below to schedule a visit and establish care. When you choose a [facility] primary care physician or advanced practice  provider, you gain a long-term partner in health. Find a Primary Care Provider  Learn more about [facility]'s in-office and virtual care options: [facility] - Get Care Now

## 2023-12-05 NOTE — Telephone Encounter (Signed)
 Copied from CRM [id number]. Topic: Clinical - Red Word Triage >> Dec 05, 2023 12:33 PM Brittney F wrote: Red Word that prompted transfer to Nurse Triage: flushing in her face; red and warm to the touch; some sensation in the face   Chief Complaint: Allergic Reaction-Suspected  Symptoms: Redness, Flushed,   Frequency: One hour ago  Pertinent Negatives: Patient denies Swelling.   Disposition: [] ED /[] Urgent Care (no appt availability in office) / [] Appointment(In office/virtual)/ []  [facility]/ [] Home Care/ [] Refused Recommended Disposition /[] [facility]/ []  Follow-up with PCP Additional Notes: Flushed, Redness, and Warm to the touch. Unsure if it was in reaction to the shot she received in her hip yesterday. The patient opted to take an OTC antihistamine and call back.

## 2023-12-06 ENCOUNTER — Encounter: Payer: Self-pay | Admitting: Sports Medicine

## 2023-12-06 NOTE — Telephone Encounter (Signed)
 This is normal after the steroid injection, goes away on its own, no further evaluation or intervention needed.

## 2023-12-06 NOTE — Telephone Encounter (Signed)
 Attempted call to patient.left a voice mail message requesting a return call.

## 2023-12-07 DIAGNOSIS — H5213 Myopia, bilateral: Secondary | ICD-10-CM | POA: Diagnosis not present

## 2023-12-07 NOTE — Telephone Encounter (Signed)
 Patient states she had telehealth visit with provider and was told that was likely a steroid flush-   She has been taking cetirizine  and Famotidine   and symptoms have improved but not yet completely resolved.

## 2023-12-12 ENCOUNTER — Ambulatory Visit: Attending: Sports Medicine | Admitting: Physical Therapy

## 2023-12-12 NOTE — Therapy (Deleted)
 OUTPATIENT PHYSICAL THERAPY LOWER EXTREMITY EVALUATION  Patient Name: Michelle [name] MRN: [id number] DOB:1990/12/22, 33 y.o., female Today's Date: 12/12/2023    Past Medical History:  Diagnosis Date   Anxiety    Asthma    Bipolar disorder (HCC)    no current med.   Depression    no current med.   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   Migraine    Nephrolithiasis    PTSD (post-traumatic stress disorder)    PTSD (post-traumatic stress disorder)    Pyelonephritis 01/16/2019   Seizures (HCC)    Tachycardia    TMJ (dislocation of temporomandibular joint)    Vertigo    Past Surgical History:  Procedure Laterality Date   ABDOMINAL HYSTERECTOMY     ADENOIDECTOMY, TONSILLECTOMY AND MYRINGOTOMY WITH TUBE PLACEMENT  1990's   APPENDECTOMY     CESAREAN SECTION  06/20/2012   Procedure: CESAREAN SECTION;  Surgeon: Ana Balling, MD;  Location: [facility];  Service: Obstetrics;  Laterality: N/A;   HIP SURGERY     LAPAROSCOPIC APPENDECTOMY  07/25/2012   Procedure: APPENDECTOMY LAPAROSCOPIC;  Surgeon: Cloyce Darby, MD;  Location: [facility] OR;  Service: General;  Laterality: N/A;   LAPAROSCOPIC TUBAL LIGATION Bilateral 08/04/2014   Procedure: BILATERAL LAPAROSCOPIC TUBAL LIGATION;  Surgeon: Tresia Fruit, MD;  Location: [facility];  Service: Gynecology;  Laterality: Bilateral;   LIPOMA EXCISION N/A 09/16/2015   Procedure: EXCISION LIPOMA LUMBAR REGION;  Surgeon: Dareen Ebbing, MD;  Location: [facility];  Service: General;  Laterality: N/A;   TONSILLECTOMY     WISDOM TOOTH EXTRACTION     Patient Active Problem List   Diagnosis Date  Noted   Perforation of tympanic membrane 02/06/2023   Ear pain, right 01/23/2023   Injection site reaction 08/09/2022   Penicillin allergy  03/22/2022   Sinus infection 01/02/2022   Left lower quadrant abdominal pain 11/07/2021   Epiploic appendagitis 10/18/2021   Sacroiliac joint dysfunction of right side 10/17/2021   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   Seborrheic dermatitis 09/22/2021   Other fatigue 09/11/2021   Hair loss 09/11/2021   Constipation 08/30/2021   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   Dizziness 02/10/2021   Hypermobility syndrome 10/04/2020   Encounter for pre-operative examination 08/17/2020   Nipple discharge in female 06/04/2020   GERD (gastroesophageal reflux disease) 06/04/2020   Labral tear of right hip joint 04/12/2020   Right hip pain 02/03/2020   Finger pain, right 10/23/2019   Eustachian tube dysfunction, bilateral 12/16/2018   Bee sting allergy  11/06/2018   Allergic reaction 11/04/2018   Mild intermittent asthma without complication 11/04/2018   Chronic rhinitis 11/04/2018   Chronic urticaria 10/04/2018   Rash 11/16/2017   Easy bruising 11/16/2017   Anxiety 10/27/2016   SUI (stress urinary incontinence, female) 04/10/2016   Overactive bladder 04/10/2016   Migraine 02/10/2016   Lipoma 08/23/2015   PTSD 03/02/2010   Tobacco use disorder 02/07/2008   DSORD Elease Grice, 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    PCP: Vallorie Gayer, DO  REFERRING PROVIDER: Frazier Jacob, DO  THERAPY DIAG:  No diagnosis found.  REFERRING DIAG: Hip pain  Rationale for Evaluation and Treatment:  Rehabilitation  SUBJECTIVE:  PERTINENT PAST HISTORY:  Bipolar, PTSD, Stress incontinence, EDS, R hip labral repair in 2020        PRECAUTIONS: {Therapy precautions:24002}  WEIGHT BEARING RESTRICTIONS {Yes ***/No:24003}  FALLS:  Has patient fallen in last 6 months? {yes/no:20286}, Number of falls: ***  MOI/History of condition:  Onset date: ***  SUBJECTIVE  STATEMENT  Michelle [name] is a 33 y.o. female who presents to clinic with chief complaint of ***.  ***   Red flags:  {has/denies:26543} {kerredflag:26542}  Pain:  Are you having pain? {yes/no:20286} Pain location: *** NPRS scale:  {NUMBERS; 0-10:5044}/10 to {NUMBERS; 0-10:5044}/10 Aggravating factors: *** Relieving factors: *** Pain description: {PAIN DESCRIPTION:21022940} Stage: {Desc; acute/subacute/chronic:13799} 24 hour pattern: ***   Occupation: ***  Assistive Device: ***  Hand Dominance: ***  Patient Goals/Specific Activities: ***   OBJECTIVE:   DIAGNOSTIC FINDINGS:  IMPRESSION: Compared to 06/27/2020:   1. Interval postsurgical change within the anterior superior right acetabulum. There is linear extension of contrast from the articular side through the peripheral side of the chondrolabral junction in the same region of the anterior superior right acetabular labrum where a full-thickness tear was seen on preoperative MRI 06/27/2020. This may represent a focal residual or recurrent tear. 2. Mild anterior superior right acetabular and femoral head cartilage thinning is minimally worsened from 06/27/2020. 3. Mild concavity of the anterior superior right femoral head-neck junction likely represents interval osteochondroplasty in this region, treatment for CAM-type femoroacetabular impingement. 4. Mild marrow edema within the sacral side of the right greater than left inferior sacroiliac joints. This is new on the right and slightly increased on the left compared to 06/27/2020 MRI. This may represent mild osteoarthritis.  GENERAL OBSERVATION/GAIT: ***  SENSATION: Light touch: {intact/deficits:24005}  PALPATION: ***  MUSCLE LENGTH: Hamstrings: Right {kerminsig:27227} restriction; Left {kerminsig:27227} restriction Hip flexors: Right {kerminsig:27227} restriction; Left {kerminsig:27227} restriction Rec Fem: Right {kerminsig:27227} restriction; Left  {kerminsig:27227} restriction  LE MMT:  MMT Right (Eval) Left (Eval)  Hip flexion (L2, L3) *** ***  Knee extension (L3) *** ***  Knee flexion *** ***  Hip abduction *** ***  Hip extension *** ***  Hip external rotation    Hip internal rotation    Hip adduction    Ankle dorsiflexion (L4)    Ankle plantarflexion (S1)    Ankle inversion    Ankle eversion    Great Toe ext (L5)    Grossly     (Blank rows = not tested, score listed is out of 5 possible points.  N = WNL, D = diminished, C = clear for gross weakness with myotome testing, * = concordant pain with testing)  LE ROM:  ROM Right (Eval) Left (Eval)  Hip flexion    Hip extension    Hip abduction    Hip adduction    Hip internal rotation    Hip external rotation    Knee extension    Knee flexion    Ankle dorsiflexion    Ankle plantarflexion    Ankle inversion    Ankle eversion     (Blank rows = not tested, N = WNL, * = concordant pain with testing)  Functional Tests  Eval  SPECIAL TESTS:  ***   PATIENT SURVEYS:  ***   TODAY'S TREATMENT: ***   PATIENT EDUCATION ([place]/[facility]):  POC, diagnosis, prognosis, HEP, and outcome measures.  Pt educated via explanation, demonstration, and handout (HEP).  Pt confirms understanding verbally.   HOME EXERCISE PROGRAM: ***  Treatment priorities   Eval                                                  ASSESSMENT:  CLINICAL IMPRESSION: Michelle [name] is a 33 y.o. female who presents to clinic with signs and sxs consistent with ***.    OBJECTIVE IMPAIRMENTS: Pain, ***  ACTIVITY LIMITATIONS: ***  PERSONAL FACTORS: See medical history and pertinent history   REHAB POTENTIAL: {rehabpotential:25112}  CLINICAL DECISION MAKING: {clinical decision making:25114}  EVALUATION COMPLEXITY: {Evaluation complexity:25115}   GOALS:   SHORT TERM GOALS: Target date: ***  Michelle [name] will be >75% HEP  compliant to improve carryover between sessions and facilitate independent management of condition  Evaluation: ongoing Goal status: INITIAL   LONG TERM GOALS: Target date: ***  Michelle [name] will [name] report >/= 50% decrease in pain from evaluation to improve function in daily tasks  Evaluation/Baseline: ***/10 max pain Goal status: INITIAL   2.  ***   3.  ***   4.  ***   5.  ***   6.  ***   PLAN: PT FREQUENCY: 1-2x/week  PT DURATION: 8 weeks  PLANNED INTERVENTIONS:  97164- PT Re-evaluation, 97110-Therapeutic exercises, 97530- Therapeutic activity, 97112- Neuromuscular re-education, 97535- [name] Care, 40981- Manual therapy, [id number]- Gait training, [id number]- Aquatic Therapy, [id number]- Electrical stimulation (manual), [id number]- Vasopneumatic device, [id number]- Traction (mechanical), [id number]- Ionotophoresis 4mg /ml Dexamethasone , Taping, Dry Needling, Joint manipulation, and Spinal manipulation.   Caedon Bond PT, DPT 12/12/2023, 12:39 PM

## 2023-12-13 ENCOUNTER — Telehealth: Payer: Self-pay

## 2023-12-13 NOTE — Telephone Encounter (Signed)
 Patient came into office to see if she could get any suggestions on a surgeon for her hip. Patient states that she would like to know if she needs a labor repair, or hip replacement. Please advise, thanks.

## 2023-12-13 NOTE — Telephone Encounter (Signed)
 I think Dr. Lovenia Ruby is primarily managing this, my role was only for the contrast injection.  I will route this question to Dr. Lovenia Ruby.

## 2023-12-14 NOTE — Telephone Encounter (Signed)
 I spoke with patient. She is going to keep her visit with Dr. Darene Economy on 12/18/23. She will call us  back if she still feels she needs advice from Dr. Lovenia Ruby.

## 2023-12-18 DIAGNOSIS — M25551 Pain in right hip: Secondary | ICD-10-CM | POA: Diagnosis not present

## 2023-12-21 ENCOUNTER — Ambulatory Visit (INDEPENDENT_AMBULATORY_CARE_PROVIDER_SITE_OTHER): Admitting: Student

## 2023-12-21 ENCOUNTER — Encounter: Payer: Self-pay | Admitting: Student

## 2023-12-21 VITALS — BP 108/78 | HR 78 | Ht 62.0 in | Wt 157.0 lb

## 2023-12-21 DIAGNOSIS — Z124 Encounter for screening for malignant neoplasm of cervix: Secondary | ICD-10-CM | POA: Diagnosis not present

## 2023-12-21 DIAGNOSIS — Z01818 Encounter for other preprocedural examination: Secondary | ICD-10-CM

## 2023-12-21 LAB — POCT GLYCOSYLATED HEMOGLOBIN (HGB A1C): Hemoglobin A1C: 5.6 % (ref 4.0–5.6)

## 2023-12-21 MED ORDER — FLUTICASONE PROPIONATE 50 MCG/ACT NA SUSP: 2.0000 | Freq: Every day | NASAL | 6 refills | Status: AC

## 2023-12-21 MED ORDER — ALBUTEROL SULFATE HFA 108 (90 BASE) MCG/ACT IN AERS: 2.0000 | INHALATION_SPRAY | RESPIRATORY_TRACT | 1 refills | Status: AC | PRN

## 2023-12-21 MED ORDER — CETIRIZINE HCL 10 MG PO TABS: 10.0000 mg | ORAL_TABLET | Freq: Every day | ORAL | 11 refills | Status: AC

## 2023-12-21 MED ORDER — EPIPEN 2-PAK 0.3 MG/0.3ML IJ SOAJ: 0.3000 mg | Freq: Once | INTRAMUSCULAR | 1 refills | Status: AC | PRN

## 2023-12-21 MED ORDER — NICOTINE 14 MG/24HR TD PT24: 14.0000 mg | MEDICATED_PATCH | Freq: Every day | TRANSDERMAL | 0 refills | Status: DC

## 2023-12-21 MED ORDER — FAMOTIDINE 20 MG PO TABS: 20.0000 mg | ORAL_TABLET | Freq: Every day | ORAL | 5 refills | Status: AC

## 2023-12-21 NOTE — Assessment & Plan Note (Addendum)
 Obtain CBC, coag labs (given hx easy bruising), A1c.  Final Assessment: Average risk.  The patient is at average risk of complications from a moderate risk surgery, although above average risk for pneumonia per NISQ risk calculator. Letter faxed to surgeon relaying this information.  I recommend the following additional tests: None other than those above. No acute or unstable cardiopulmonary symptoms necessitating chest x-ray prior to surgery. I recommend the following changes to medications in the perioperative setting: Start nicotine  patches 14 mg q24 hours and cease vaping.

## 2023-12-21 NOTE — Patient Instructions (Addendum)
 It was great seeing you today.  As we discussed, - Nicotine  patches sent to pharmacy, try to significantly lower vaping use before surgery to reduce pneumonia and infection risk - Labs today - I will fax surgery letter to their office   If you have any questions or concerns, please feel free to call the clinic.   Have a wonderful day,  Dr. Vallorie Gayer [facility] [phone]

## 2023-12-21 NOTE — Progress Notes (Signed)
    SUBJECTIVE:   CHIEF COMPLAINT / HPI:   Surgical Pre-operative Evaluation:  Procedure: Right labrum hip tear repair with bone spurring Procedural risk: Intermediate risk   Anesthesia: General  PMH: - Hypermobility Ehler's Danlos Syndrome - Current nicotine  user (vaping) - Mild intermittent asthma without complication (has not had to use inhaler lately) - Bipolar 1 disorder  Surgical History:  The patient denies any complication with anesthesia, bleeding, or post-operative confusion, nausea, or vomiting in prior surgical interventions. The patient denies any  history of spinal surgeries.  Family History: The patient denies any family history of complications with anesthesia and VTE.  Social History: Tobacco Use: Vape (Daily) (Recommend smoking cessation anytime before surgery to improve wound healing, 4-8 weeks befter surgery reduces risk of respiratory complications) Alcohol Use: None Other substance use: None  Functional Capacity: 8 METS  OBJECTIVE:   BP 108/78   Pulse 78   Ht 5\' 2"  (1.575 m)   Wt 157 lb (71.2 kg)   LMP 08/14/2016 (Exact Date)   SpO2 98%   BMI 28.72 kg/m   General: Pleasant, well apearing HEENT: Mucous membranes moist. No oropharyngeal erythema. Fair dentition. Cardiac: RRR Neuro: A&O Respiratory: normal WOB on RA. No wheezing or crackles on auscultation, good lung sounds throughout Extremities: Moving all 4 extremities equally  ASSESSMENT/PLAN:   Pre-operative exam Obtain CBC, coag labs (given hx easy bruising), A1c.  Final Assessment: Average risk.  The patient is at average risk of complications from a moderate risk surgery, although above average risk for pneumonia per NISQ risk calculator. Letter faxed to surgeon relaying this information.  I recommend the following additional tests: None other than those above. No acute or unstable cardiopulmonary symptoms necessitating chest x-ray prior to surgery. I recommend the following  changes to medications in the perioperative setting: Start nicotine  patches 14 mg q24 hours and cease vaping.   Crystian Frith, DO [facility]

## 2023-12-22 LAB — PROTIME-INR
INR: 0.9 (ref 0.9–1.2)
Prothrombin Time: 10.3 s (ref 9.1–12.0)

## 2023-12-22 LAB — CBC WITH DIFFERENTIAL/PLATELET
Basophils Absolute: 0 10*3/uL (ref 0.0–0.2)
Basos: 0 %
EOS (ABSOLUTE): 0 10*3/uL (ref 0.0–0.4)
Eos: 0 %
Hematocrit: 44.6 % (ref 34.0–46.6)
Hemoglobin: 14.6 g/dL (ref 11.1–15.9)
Immature Grans (Abs): 0 10*3/uL (ref 0.0–0.1)
Immature Granulocytes: 0 %
Lymphocytes Absolute: 2.1 10*3/uL (ref 0.7–3.1)
Lymphs: 25 %
MCH: 30.9 pg (ref 26.6–33.0)
MCHC: 32.7 g/dL (ref 31.5–35.7)
MCV: 94 fL (ref 79–97)
Monocytes Absolute: 0.8 10*3/uL (ref 0.1–0.9)
Monocytes: 9 %
Neutrophils Absolute: 5.6 10*3/uL (ref 1.4–7.0)
Neutrophils: 66 %
Platelets: 270 10*3/uL (ref 150–450)
RBC: 4.73 x10E6/uL (ref 3.77–5.28)
RDW: 11.9 % (ref 11.7–15.4)
WBC: 8.6 10*3/uL (ref 3.4–10.8)

## 2023-12-22 LAB — APTT: aPTT: 30 s (ref 24–33)

## 2023-12-24 ENCOUNTER — Telehealth: Payer: Self-pay

## 2023-12-24 NOTE — Telephone Encounter (Signed)
 Pharmacy [facility] faxed a form stating that Brand Epipen  0.3mg  inj is on indefinite back order. Please resend rx with substitution allowed.  Placed form in PCP's box

## 2023-12-25 ENCOUNTER — Encounter: Payer: Self-pay | Admitting: *Deleted

## 2023-12-25 ENCOUNTER — Telehealth: Payer: Self-pay

## 2023-12-25 NOTE — Telephone Encounter (Signed)
 Patient returns call to nurse line. She states that she placed Nicotine  patch on left arm on Saturday evening. After placing patch, she noticed some itching/ irritation. She removed patch on Sunday and noticed that area was broke out in a red, itchy rash.   She states that she has taken cetirizine  with no improvement. Denies fever or chills.   She reports placing a patch on right arm with no adverse reactions.   Advised patient that I would send message to provider regarding continuance of patches, however, would hold off placing additional patches until instructed by provider. She states that she tried Chantix  in the past for smoking cessation, however, felt that this did not work for her.   She is agreeable to other alternatives if appropriate.   Please advise.   Elsie Halo, RN

## 2023-12-25 NOTE — Telephone Encounter (Signed)
 Patient LVM on nurse line regards to nicotine  patches.   She reports a rash around the patch site. No further information given.   Attempted to call patient back, however no answer.

## 2023-12-28 ENCOUNTER — Other Ambulatory Visit: Payer: Self-pay | Admitting: Student

## 2023-12-28 MED ORDER — NICOTINE POLACRILEX 4 MG MT GUM: 4.0000 mg | CHEWING_GUM | OROMUCOSAL | 0 refills | Status: DC | PRN

## 2023-12-28 NOTE — Progress Notes (Signed)
 Nicorette gum sent to pharmacy. Pt had advserse skin reaction to nicotine  patches.

## 2024-01-10 DIAGNOSIS — M25551 Pain in right hip: Secondary | ICD-10-CM | POA: Diagnosis not present

## 2024-01-11 DIAGNOSIS — M25551 Pain in right hip: Secondary | ICD-10-CM | POA: Diagnosis not present

## 2024-01-11 DIAGNOSIS — S70251A Superficial foreign body, right hip, initial encounter: Secondary | ICD-10-CM | POA: Diagnosis not present

## 2024-01-11 DIAGNOSIS — M6588 Other synovitis and tenosynovitis, other site: Secondary | ICD-10-CM | POA: Diagnosis not present

## 2024-01-11 DIAGNOSIS — M65861 Other synovitis and tenosynovitis, right lower leg: Secondary | ICD-10-CM | POA: Diagnosis not present

## 2024-01-11 DIAGNOSIS — M65851 Other synovitis and tenosynovitis, right thigh: Secondary | ICD-10-CM | POA: Diagnosis not present

## 2024-01-11 DIAGNOSIS — M25851 Other specified joint disorders, right hip: Secondary | ICD-10-CM | POA: Diagnosis not present

## 2024-01-15 DIAGNOSIS — Z9889 Other specified postprocedural states: Secondary | ICD-10-CM | POA: Diagnosis not present

## 2024-01-15 DIAGNOSIS — M25651 Stiffness of right hip, not elsewhere classified: Secondary | ICD-10-CM | POA: Diagnosis not present

## 2024-01-15 DIAGNOSIS — M25551 Pain in right hip: Secondary | ICD-10-CM | POA: Diagnosis not present

## 2024-01-15 DIAGNOSIS — R2689 Other abnormalities of gait and mobility: Secondary | ICD-10-CM | POA: Diagnosis not present

## 2024-01-16 ENCOUNTER — Emergency Department (HOSPITAL_BASED_OUTPATIENT_CLINIC_OR_DEPARTMENT_OTHER)

## 2024-01-16 ENCOUNTER — Encounter (HOSPITAL_BASED_OUTPATIENT_CLINIC_OR_DEPARTMENT_OTHER): Payer: Self-pay

## 2024-01-16 ENCOUNTER — Emergency Department (HOSPITAL_BASED_OUTPATIENT_CLINIC_OR_DEPARTMENT_OTHER)
Admission: EM | Admit: 2024-01-16 | Discharge: 2024-01-16 | Disposition: A | Discharge status: Home / Self Care | Attending: Emergency Medicine | Admitting: Emergency Medicine

## 2024-01-16 ENCOUNTER — Other Ambulatory Visit: Payer: Self-pay

## 2024-01-16 DIAGNOSIS — D72829 Elevated white blood cell count, unspecified: Secondary | ICD-10-CM | POA: Diagnosis not present

## 2024-01-16 DIAGNOSIS — R1012 Left upper quadrant pain: Secondary | ICD-10-CM | POA: Diagnosis not present

## 2024-01-16 DIAGNOSIS — K760 Fatty (change of) liver, not elsewhere classified: Secondary | ICD-10-CM | POA: Diagnosis not present

## 2024-01-16 DIAGNOSIS — Z90710 Acquired absence of both cervix and uterus: Secondary | ICD-10-CM | POA: Diagnosis not present

## 2024-01-16 DIAGNOSIS — N83202 Unspecified ovarian cyst, left side: Secondary | ICD-10-CM | POA: Diagnosis not present

## 2024-01-16 DIAGNOSIS — R1032 Left lower quadrant pain: Secondary | ICD-10-CM | POA: Diagnosis not present

## 2024-01-16 LAB — COMPREHENSIVE METABOLIC PANEL WITH GFR
ALT: 156 U/L — ABNORMAL HIGH (ref 0–44)
AST: 128 U/L — ABNORMAL HIGH (ref 15–41)
Albumin: 3.9 g/dL (ref 3.5–5.0)
Alkaline Phosphatase: 89 U/L (ref 38–126)
Anion gap: 9 (ref 5–15)
BUN: 15 mg/dL (ref 6–20)
CO2: 28 mmol/L (ref 22–32)
Calcium: 9.6 mg/dL (ref 8.9–10.3)
Chloride: 100 mmol/L (ref 98–111)
Creatinine, Ser: 0.68 mg/dL (ref 0.44–1.00)
GFR, Estimated: >60 mL/min (ref >60)
Glucose, Bld: 95 mg/dL (ref 70–99)
Potassium: 4.4 mmol/L (ref 3.5–5.1)
Sodium: 137 mmol/L (ref 135–145)
Total Bilirubin: 0.2 mg/dL (ref 0.0–1.2)
Total Protein: 6.7 g/dL (ref 6.5–8.1)

## 2024-01-16 LAB — LIPASE, BLOOD: Lipase: 23 U/L (ref 11–51)

## 2024-01-16 LAB — CBC
HCT: 40.2 % (ref 36.0–46.0)
Hemoglobin: 13.6 g/dL (ref 12.0–15.0)
MCH: 30.7 pg (ref 26.0–34.0)
MCHC: 33.8 g/dL (ref 30.0–36.0)
MCV: 90.7 fL (ref 80.0–100.0)
Platelets: 280 10*3/uL (ref 150–400)
RBC: 4.43 MIL/uL (ref 3.87–5.11)
RDW: 11.8 % (ref 11.5–15.5)
WBC: 11.3 10*3/uL — ABNORMAL HIGH (ref 4.0–10.5)
nRBC: 0 % (ref 0.0–0.2)

## 2024-01-16 LAB — URINALYSIS, ROUTINE W REFLEX MICROSCOPIC
Bilirubin Urine: NEGATIVE
Glucose, UA: NEGATIVE mg/dL
Hgb urine dipstick: NEGATIVE
Ketones, ur: NEGATIVE mg/dL
Leukocytes,Ua: NEGATIVE
Nitrite: NEGATIVE
Protein, ur: NEGATIVE mg/dL
Specific Gravity, Urine: 1.012 (ref 1.005–1.030)
pH: 7 (ref 5.0–8.0)

## 2024-01-16 MED ORDER — SODIUM CHLORIDE 0.9 % IV BOLUS
1000.0000 mL | Freq: Once | INTRAVENOUS | Status: AC
  Administered 2024-01-16: 1000 mL via INTRAVENOUS

## 2024-01-16 MED ORDER — ONDANSETRON HCL 4 MG/2ML IJ SOLN
4.0000 mg | Freq: Once | INTRAMUSCULAR | Status: AC
  Administered 2024-01-16: 4 mg via INTRAVENOUS
  Filled 2024-01-16: qty 2

## 2024-01-16 MED ORDER — FENTANYL CITRATE PF 50 MCG/ML IJ SOSY
50.0000 ug | PREFILLED_SYRINGE | Freq: Once | INTRAMUSCULAR | Status: AC
  Administered 2024-01-16: 50 ug via INTRAVENOUS
  Filled 2024-01-16: qty 1

## 2024-01-16 MED ORDER — IOHEXOL 300 MG/ML  SOLN
100.0000 mL | Freq: Once | INTRAMUSCULAR | Status: AC | PRN
  Administered 2024-01-16: 100 mL via INTRAVENOUS

## 2024-01-16 MED ORDER — HYDROCODONE-ACETAMINOPHEN 5-325 MG PO TABS: 1.0000 | ORAL_TABLET | Freq: Four times a day (QID) | ORAL | 0 refills | Status: AC | PRN

## 2024-01-16 NOTE — ED Triage Notes (Signed)
 Pt POV with husband with LLQ ABD pain.  Pt has hx of kidney stones and ha had recent Right Labrum surgery 5 days ago.  Pt has been urinating less today than normal and pain onset 1230 today.  Pt took Valium at 1600 and Naproxen  at 0800 - normal surgical meds.  Pt also on Xaralto.

## 2024-01-16 NOTE — ED Provider Notes (Signed)
 [place] EMERGENCY DEPARTMENT AT [facility] Provider Note   CSN: [id number] Arrival date & time: 01/16/24  1926     Patient presents with: Abdominal Pain   Michelle [name] is a 33 y.o. female who presents to the ED with a cc of L sided abdominal pain.  Patient had a recent hip surgery for debridement of scar tissue on the right.  She awoke with pain in the left side of her abdomen from sleep last night.  She states that this is similar to previous kidney stones but denies any urinary symptoms flank pain or pelvic pressure.  Pain is waxing and waning severe at times she does have nausea but has not had any vomiting.  She is currently taking naproxen  and Valium for pain control from her surgery.  She is not on any opiates.  She denies fevers chills chest pain or shortness of breath    Abdominal Pain      Prior to Admission medications   Medication Sig Start Date End Date Taking? Authorizing Provider  albuterol  (VENTOLIN  HFA) 108 (90 Base) MCG/ACT inhaler Inhale 2 puffs into the lungs every 4 (four) hours as needed for wheezing or shortness of breath. 12/21/23   Dameron, Marisa, DO  cetirizine  (ZYRTEC ) 10 MG tablet Take 1 tablet (10 mg total) by mouth daily. 12/21/23   Dameron, Marisa, DO  EPIPEN  2-PAK 0.3 MG/0.3ML SOAJ injection Inject 0.3 mg into the muscle once as needed for anaphylaxis. 12/21/23   Dameron, Marisa, DO  famotidine  (PEPCID ) 20 MG tablet Take 1 tablet (20 mg total) by mouth daily. 12/21/23   Dameron, Marisa, DO  fluticasone  (FLONASE ) 50 MCG/ACT nasal spray Place 2 sprays into both nostrils daily. 12/21/23   Dameron, Marisa, DO  nicotine  polacrilex (NICORETTE ) 4 MG gum Take 1 each (4 mg total) by mouth as needed for smoking cessation. 12/28/23   Dameron, Marisa, DO    Allergies: Adhesive [tape], Bee venom, Reglan  [metoclopramide ], and Amoxil  [amoxicillin ]    Review of Systems  Gastrointestinal:  Positive for abdominal pain.    Updated Vital Signs BP (!) 154/97 (BP  Location: Right Arm)   Pulse 70   Temp 97.8 F (36.6 C) (Oral)   Resp 16   Ht 5' 2 (1.575 m)   Wt 70.3 kg   LMP 08/14/2016 (Exact Date)   SpO2 100%   BMI 28.35 kg/m   Physical Exam Vitals and nursing note reviewed.  Constitutional:      General: She is not in acute distress.    Appearance: She is well-developed. She is not diaphoretic.  HENT:     Head: Normocephalic and atraumatic.     Right Ear: External ear normal.     Left Ear: External ear normal.     Nose: Nose normal.     Mouth/Throat:     Mouth: Mucous membranes are moist.  Eyes:     General: No scleral icterus.    Conjunctiva/sclera: Conjunctivae normal.  Cardiovascular:     Rate and Rhythm: Normal rate and regular rhythm.     Heart sounds: Normal heart sounds. No murmur heard.    No friction rub. No gallop.  Pulmonary:     Effort: Pulmonary effort is normal. No respiratory distress.     Breath sounds: Normal breath sounds.  Abdominal:     General: Bowel sounds are normal. There is no distension.     Palpations: Abdomen is soft. There is no mass.     Tenderness: There is abdominal tenderness  in the left upper quadrant and left lower quadrant. There is no right CVA tenderness, left CVA tenderness or guarding.  Musculoskeletal:     Cervical back: Normal range of motion.  Skin:    General: Skin is warm and dry.  Neurological:     Mental Status: She is alert and oriented to person, place, and time.  Psychiatric:        Behavior: Behavior normal.     (all labs ordered are listed, but only abnormal results are displayed) Labs Reviewed  COMPREHENSIVE METABOLIC PANEL WITH GFR - Abnormal; Notable for the following components:      Result Value   AST 128 (*)    ALT 156 (*)    All other components within normal limits  CBC - Abnormal; Notable for the following components:   WBC 11.3 (*)    All other components within normal limits  URINALYSIS, ROUTINE W REFLEX MICROSCOPIC - Abnormal; Notable for the following  components:   Color, Urine COLORLESS (*)    All other components within normal limits  LIPASE, BLOOD    EKG: None  Radiology: No results found.   Procedures   Medications Ordered in the ED  fentaNYL  (SUBLIMAZE ) injection 50 mcg (has no administration in time range)  sodium chloride  0.9 % bolus 1,000 mL (has no administration in time range)  ondansetron  (ZOFRAN ) injection 4 mg (has no administration in time range)  iohexol  (OMNIPAQUE ) 300 MG/ML solution 100 mL (100 mLs Intravenous Contrast Given 01/16/24 2105)                                    Medical Decision Making 33 y/o F  with recent R hip surgery presents with Left lower quadrant (LLQ) abdominal pain. Pertinent History:  History of nephrolithiasis (kidney stones).  No history of gynecologic malignancy or prior ovarian pathology.  No recent trauma  or known gastrointestinal disease.  Differential diagnosis:  LLQ pain is a common presentation with a broad differential, including gastrointestinal (e.g., diverticulitis, colitis), urologic (e.g., urolithiasis), and gynecologic (e.g., ovarian cyst, torsion) etiologies.    Laboratory evaluation - mildly elevated white blood cell count, which may represent a nonspecific acute phase reactant in the context of acute pain and/or benign inflammatory processes. Hepatic enzymes elevated in the setting of Hepatic steatosis- will have patient follow up on elevated liver enzymes with pcp  Imaging:  CT abdomen and pelvis with contrast demonstrates a benign-appearing ovarian cyst , no evidence of hemorrhage or torsion.  No evidence of acute diverticulitis, appendicitis, bowel obstruction, or urolithiasis. No free air, abscess, or other acute intra-abdominal pathology identified.  No hydronephrosis or perinephric stranding to suggest acute stone passage.  Medical Decision-Making: Patient here w/ LLQ pain Patient is afebrile. No peritoneal signs on exam. No hemodynamic  instability.  Urolithiasis is less likely given the absence of stones or secondary signs on CT and lack of hematuria.  Findings consistent with benign ovarian cyst, which can cause LLQ pain  The mildly elevated WBC count is likely a nonspecific response to acute pain or minor inflammation, as there is no evidence of infection, abscess, or systemic inflammatory response. No evidence of acute diverticulitis  No evidence of surgical or life-threatening pathology on imaging or exam.  No acute intervention required. Symptomatic management (analgesia, observation) is appropriate.  Outpatient gynecologic follow-up is recommended for interval assessment of the ovarian cyst  Return precautions provided for worsening pain,  fever, vomiting, or new symptoms. Follow up with pcp regarding elevated liver enzymes. PDMP reviewed during this encounter.  Disposition:  Discharged in stable condition with instructions for follow-up and return precautions  Amount and/or Complexity of Data Reviewed Labs: ordered. Radiology: ordered.  Risk Prescription drug management.         Final diagnoses:  None    ED Discharge Orders     None          Michelle Chroman, PA-C 01/17/24 1619    Michelle Lamar BROCKS, MD 01/22/24 1017

## 2024-01-16 NOTE — Discharge Instructions (Signed)
 Follow these instructions at home: Take all medicines only as told by your health care provider. If told, get Pap tests and regular exams of the pelvis. Do not smoke, vape, or use nicotine or tobacco. Return to your normal activities when your provider says that it's safe. Keep all follow-up visits. Your provider may want to check your cyst for 2-3 months to see if it changes. If you're in menopause, it's important to have your cyst checked closely because females in this age group have a higher rate of cancer of the ovaries. Contact a health care provider if: You have any new symptoms. Your symptoms get worse. Get help right away if: You have really bad pain in the belly or pelvis, and the pain comes on all of a sudden. You have heavy bleeding that soaks through more than one pad every hour.

## 2024-01-16 NOTE — ED Notes (Signed)
PA at bedside with patient and family.   

## 2024-01-17 ENCOUNTER — Encounter (HOSPITAL_BASED_OUTPATIENT_CLINIC_OR_DEPARTMENT_OTHER): Payer: Self-pay

## 2024-01-17 DIAGNOSIS — M25651 Stiffness of right hip, not elsewhere classified: Secondary | ICD-10-CM | POA: Diagnosis not present

## 2024-01-17 DIAGNOSIS — R2689 Other abnormalities of gait and mobility: Secondary | ICD-10-CM | POA: Diagnosis not present

## 2024-01-17 DIAGNOSIS — Z9889 Other specified postprocedural states: Secondary | ICD-10-CM | POA: Diagnosis not present

## 2024-01-17 DIAGNOSIS — M25551 Pain in right hip: Secondary | ICD-10-CM | POA: Diagnosis not present

## 2024-01-22 ENCOUNTER — Ambulatory Visit (INDEPENDENT_AMBULATORY_CARE_PROVIDER_SITE_OTHER): Admitting: Family Medicine

## 2024-01-22 VITALS — BP 112/79 | HR 99 | Temp 98.2°F | Ht 62.0 in | Wt 168.1 lb

## 2024-01-22 DIAGNOSIS — Z9889 Other specified postprocedural states: Secondary | ICD-10-CM | POA: Diagnosis not present

## 2024-01-22 DIAGNOSIS — R2689 Other abnormalities of gait and mobility: Secondary | ICD-10-CM | POA: Diagnosis not present

## 2024-01-22 DIAGNOSIS — M25651 Stiffness of right hip, not elsewhere classified: Secondary | ICD-10-CM | POA: Diagnosis not present

## 2024-01-22 DIAGNOSIS — R7989 Other specified abnormal findings of blood chemistry: Secondary | ICD-10-CM | POA: Diagnosis not present

## 2024-01-22 DIAGNOSIS — M25551 Pain in right hip: Secondary | ICD-10-CM | POA: Diagnosis not present

## 2024-01-22 DIAGNOSIS — K5900 Constipation, unspecified: Secondary | ICD-10-CM | POA: Diagnosis not present

## 2024-01-22 MED ORDER — POLYETHYLENE GLYCOL 3350 17 GM/SCOOP PO POWD: 17.0000 g | Freq: Every day | ORAL | 0 refills | Status: AC | PRN

## 2024-01-22 MED ORDER — SENNA 8.6 MG PO TABS: 1.0000 | ORAL_TABLET | Freq: Every day | ORAL | 0 refills | Status: AC

## 2024-01-22 NOTE — Patient Instructions (Addendum)
 It was wonderful to see you today!  You are seen to follow-up on your elevated liver enzymes and incidentally seen fatty liver on CT.  We will trend your liver enzymes over your next 2 visits, and today we will test you for viruses that can cause acute liver dysfunction.  When I have the results of these tests I will give you a call if there are any abnormalities found.  At your next visit in 2 weeks, we will repeat your CMP and check your coagulation studies to further evaluate your liver function.  For your constipation I have sent you prescription for MiraLAX  and senna, you can take these daily to start and increase to twice daily if you continue to feel constipated.  Your next appointment date and time are listed below.  Please call [phone] with any questions about today's appointment.   If you need any additional refills, please call your pharmacy before calling the office.  Lucie Pinal, DO Family Medicine

## 2024-01-22 NOTE — Progress Notes (Signed)
    SUBJECTIVE:   CHIEF COMPLAINT / HPI:   ED follow up, elevated LFTs w/ fatty liver noted on CT. left lower quadrant pain from her ED visit has resolved.  She denies any new symptoms of right upper quadrant pain, fever chills, easy bleeding or bruising.  She had a hysterectomy so no longer has menses.  Currently on Xarelto for DVT prophylaxis following major joint surgery.  She will complete her course of Xarelto in the next 7 days.  She also reports that since her hip surgery on June 30 she has been somewhat backed up and has gained some weight.  She does have issues with constipation intermittently but does feel like she is somewhat backed up.  Her only medication for constipation at this time is docusate.  PERTINENT  PMH / PSH: Ehlers-Danlos  OBJECTIVE:   BP 112/79   Pulse 99   Temp 98.2 F (36.8 C) (Oral)   Ht 5' 2 (1.575 m)   Wt 168 lb 2 oz (76.3 kg)   LMP 08/14/2016 (Exact Date)   SpO2 99%   BMI 30.75 kg/m   General: A&O, NAD HEENT: No sign of trauma, EOM grossly intact Cardiac: RRR, no m/r/g Respiratory: CTAB, normal WOB, no w/c/r GI: Soft, NTTP, non-distended  Extremities: NTTP, no peripheral edema. Neuro: Normal gait, moves all four extremities appropriately. Psych: Appropriate mood and affect   ASSESSMENT/PLAN:   Assessment & Plan Elevated LFTs -repeat CMP today to trend LFTs -Acute hepatitis panel, hep c -follow up in two weeks to complete coagulation studies Constipation, unspecified constipation type -discontinue docusate -begin daily miralax  and senna   Marcelle Bebout, DO [facility]

## 2024-01-23 ENCOUNTER — Ambulatory Visit: Payer: Self-pay | Admitting: Family Medicine

## 2024-01-23 LAB — ACUTE HEP PANEL AND HEP B SURFACE AB
Hep A IgM: NEGATIVE
Hep B C IgM: NEGATIVE
Hep C Virus Ab: NONREACTIVE
Hepatitis B Surf Ab Quant: <3.5 m[IU]/mL — ABNORMAL LOW
Hepatitis B Surface Ag: NEGATIVE

## 2024-01-23 LAB — COMPREHENSIVE METABOLIC PANEL WITH GFR
ALT: 66 IU/L — ABNORMAL HIGH (ref 0–32)
AST: 29 IU/L (ref 0–40)
Albumin: 4.1 g/dL (ref 3.9–4.9)
Alkaline Phosphatase: 80 IU/L (ref 44–121)
BUN/Creatinine Ratio: 20 (ref 9–23)
BUN: 14 mg/dL (ref 6–20)
Bilirubin Total: <0.2 mg/dL (ref 0.0–1.2)
CO2: 19 mmol/L — ABNORMAL LOW (ref 20–29)
Calcium: 9.2 mg/dL (ref 8.7–10.2)
Chloride: 102 mmol/L (ref 96–106)
Creatinine, Ser: 0.7 mg/dL (ref 0.57–1.00)
Globulin, Total: 2.1 g/dL (ref 1.5–4.5)
Glucose: 93 mg/dL (ref 70–99)
Potassium: 4.5 mmol/L (ref 3.5–5.2)
Sodium: 137 mmol/L (ref 134–144)
Total Protein: 6.2 g/dL (ref 6.0–8.5)
eGFR: 116 mL/min/1.73 (ref >59)

## 2024-01-29 DIAGNOSIS — Z9889 Other specified postprocedural states: Secondary | ICD-10-CM | POA: Diagnosis not present

## 2024-01-29 DIAGNOSIS — M25551 Pain in right hip: Secondary | ICD-10-CM | POA: Diagnosis not present

## 2024-02-05 DIAGNOSIS — M25651 Stiffness of right hip, not elsewhere classified: Secondary | ICD-10-CM | POA: Diagnosis not present

## 2024-02-05 DIAGNOSIS — Z9889 Other specified postprocedural states: Secondary | ICD-10-CM | POA: Diagnosis not present

## 2024-02-05 DIAGNOSIS — M25551 Pain in right hip: Secondary | ICD-10-CM | POA: Diagnosis not present

## 2024-02-05 DIAGNOSIS — R2689 Other abnormalities of gait and mobility: Secondary | ICD-10-CM | POA: Diagnosis not present

## 2024-02-07 ENCOUNTER — Ambulatory Visit (INDEPENDENT_AMBULATORY_CARE_PROVIDER_SITE_OTHER): Payer: Self-pay | Admitting: Family Medicine

## 2024-02-07 VITALS — BP 124/86 | HR 100 | Wt 160.6 lb

## 2024-02-07 DIAGNOSIS — K760 Fatty (change of) liver, not elsewhere classified: Secondary | ICD-10-CM

## 2024-02-07 DIAGNOSIS — R7989 Other specified abnormal findings of blood chemistry: Secondary | ICD-10-CM | POA: Diagnosis not present

## 2024-02-07 DIAGNOSIS — M62838 Other muscle spasm: Secondary | ICD-10-CM | POA: Diagnosis not present

## 2024-02-07 MED ORDER — TIZANIDINE HCL 4 MG PO TABS: 4.0000 mg | ORAL_TABLET | Freq: Three times a day (TID) | ORAL | 0 refills | Status: DC | PRN

## 2024-02-07 NOTE — Assessment & Plan Note (Signed)
 In the setting of recent orthopedic surgery. Will switch to tizanidine  for spasms. Stop diazepam. Counseled on sedating effects and not taking with diazepam or hydrocodone . Continue PT. Follow up as needed.

## 2024-02-07 NOTE — Patient Instructions (Signed)
 I have repeated your LFTs today. They are likely due to fatty liver. Continue to work on diet and exercise to help with this.  I have sent in tizanidine . Be careful of sedation with this. I recommend taking the first dose before bedtime.

## 2024-02-07 NOTE — Assessment & Plan Note (Addendum)
 Likely cause of elevated LFTs; as seen on CTAP. Improved with cessation of diazepam, so this could be a contributor. She has stopped this medication. Will recheck today. If consistently downtrending, will not need to repeat. Counseled on lifestyle changes.

## 2024-02-07 NOTE — Progress Notes (Signed)
    SUBJECTIVE:   CHIEF COMPLAINT / HPI:   Needs liver enzyme check Was in ED with ovarian cyst pain and was found to have elevated LFTs with hepatic steatosis. She came in to the clinic 7/8 to have these rechecked, and they were much improved after stopping diazepam. Hepatitis panel was negative, though she is not hepatitis B immune.  Muscle spasms and pain In the right thigh. Has been on and off since her hip surgery for R labral tear. Has had more pain with PT. Takes diazepam for this pain s/p her surgery, but she has been off of this due to the effects of LFTs and possible respiratory depression with hydrocodone  she was given at the ED for her ovarian cyst pain. She is no longer taking her hydrocodone .  PERTINENT  PMH / PSH: asthma, EDS, bipolar disease  OBJECTIVE:   BP 124/86   Pulse 100   Wt 160 lb 9.6 oz (72.8 kg)   LMP 08/14/2016 (Exact Date)   SpO2 99%   BMI 29.37 kg/m   General: Alert and oriented, in NAD Skin: Warm, dry, and intact without lesions HEENT: NCAT, EOM grossly normal, midline nasal septum Cardiac: RRR, no m/r/g appreciated Respiratory: CTAB, breathing and speaking comfortably on RA Abdominal: Soft, ondistended, normoactive bowel sounds Extremities/MSK: Moves all extremities grossly equally, mild TTP over R greater trochanter and gluteal muscles Neurological: No gross focal deficit, gait normal Psychiatric: Appropriate mood and affect   ASSESSMENT/PLAN:   Assessment & Plan Hepatic steatosis Likely cause of elevated LFTs; as seen on CTAP. Improved with cessation of diazepam, so this could be a contributor. She has stopped this medication. Will recheck today. If consistently downtrending, will not need to repeat. Counseled on lifestyle changes. Muscle spasm of right lower extremity In the setting of recent orthopedic surgery. Will switch to tizanidine  for spasms. Stop diazepam. Counseled on sedating effects and not taking with diazepam or hydrocodone .  Continue PT. Follow up as needed.   Stuart Redo, MD [facility]

## 2024-02-08 ENCOUNTER — Ambulatory Visit: Payer: Self-pay | Admitting: Family Medicine

## 2024-02-08 LAB — COMPREHENSIVE METABOLIC PANEL WITH GFR
ALT: 12 IU/L (ref 0–32)
AST: 16 IU/L (ref 0–40)
Albumin: 4.6 g/dL (ref 3.9–4.9)
Alkaline Phosphatase: 74 IU/L (ref 44–121)
BUN/Creatinine Ratio: 13 (ref 9–23)
BUN: 9 mg/dL (ref 6–20)
Bilirubin Total: 0.3 mg/dL (ref 0.0–1.2)
CO2: 22 mmol/L (ref 20–29)
Calcium: 9.7 mg/dL (ref 8.7–10.2)
Chloride: 102 mmol/L (ref 96–106)
Creatinine, Ser: 0.69 mg/dL (ref 0.57–1.00)
Globulin, Total: 2.4 g/dL (ref 1.5–4.5)
Glucose: 91 mg/dL (ref 70–99)
Potassium: 4.3 mmol/L (ref 3.5–5.2)
Sodium: 140 mmol/L (ref 134–144)
Total Protein: 7 g/dL (ref 6.0–8.5)
eGFR: 117 mL/min/1.73

## 2024-02-12 DIAGNOSIS — M25551 Pain in right hip: Secondary | ICD-10-CM | POA: Diagnosis not present

## 2024-03-02 ENCOUNTER — Telehealth: Payer: Self-pay | Admitting: Family Medicine

## 2024-03-02 ENCOUNTER — Other Ambulatory Visit: Payer: Self-pay

## 2024-03-02 ENCOUNTER — Emergency Department (HOSPITAL_BASED_OUTPATIENT_CLINIC_OR_DEPARTMENT_OTHER)
Admission: EM | Admit: 2024-03-02 | Discharge: 2024-03-03 | Disposition: A | Discharge status: Home / Self Care | Attending: Emergency Medicine | Admitting: Emergency Medicine

## 2024-03-02 DIAGNOSIS — M79605 Pain in left leg: Secondary | ICD-10-CM | POA: Diagnosis not present

## 2024-03-02 DIAGNOSIS — I10 Essential (primary) hypertension: Secondary | ICD-10-CM | POA: Diagnosis not present

## 2024-03-02 DIAGNOSIS — Z7901 Long term (current) use of anticoagulants: Secondary | ICD-10-CM | POA: Diagnosis not present

## 2024-03-02 DIAGNOSIS — Z720 Tobacco use: Secondary | ICD-10-CM | POA: Insufficient documentation

## 2024-03-02 DIAGNOSIS — J45909 Unspecified asthma, uncomplicated: Secondary | ICD-10-CM | POA: Diagnosis not present

## 2024-03-02 MED ORDER — ENOXAPARIN SODIUM 80 MG/0.8ML IJ SOSY
1.0000 mg/kg | PREFILLED_SYRINGE | Freq: Once | INTRAMUSCULAR | Status: AC
  Administered 2024-03-02: 75 mg via SUBCUTANEOUS
  Filled 2024-03-02: qty 0.8

## 2024-03-02 NOTE — ED Notes (Signed)
 MD at bedside.

## 2024-03-02 NOTE — ED Notes (Signed)
 Light green and lav sent to lab
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
## 2024-03-02 NOTE — ED Triage Notes (Signed)
 Pt POV reporting swelling of L calf, ankle, and foot, recent 4hr car ride, called PCP and advised to be seen to rule out DVT. Denies CP or SOB.

## 2024-03-02 NOTE — Discharge Instructions (Signed)
 You were seen today for left leg pain and swelling.  The concern was for possible DVT.  Return tomorrow for ultrasound imaging.  You have been treated in the meantime.  If you develop chest pain or shortness of breath, you should be reevaluated.

## 2024-03-02 NOTE — Telephone Encounter (Signed)
**  After Hours/ Emergency Line Call**  Received a page to call [phone] - [phone].  Patient: Michelle [name]  Caller: Self  Confirmed name & DOB of patient with caller  Subjective:  Went to [place] [place] today this weekend. Noticed ankle and calf swelling. No redness. No pain. Does feel tight. Drive was 4 hours.  Not have swelling before the drive.  No difficulty breathing.  Not that much bigger than the other leg. History of hysterectomy, not birth control. No falls or trauma. Vapes, no tobacco use.   Objective:  Observations: NAD comfortably talking over the phon,   Assessment & Plan  Michelle [name] is a 33 y.o. female with PMHx s/f hysterectomy who calls with the following complaints and concerns:   Lower leg swelling   Recommendations:  Discussed that this is concerning for provoked deep vein thrombosis in the setting of prolonged immobility. Reassuringly no shortness of breath or difficulty breathing currently, discussed the risks of thromboembolic including PE. Recommended ED evaluation for ultrasound of lower extremity.  Patient agreeable to plan.  -- Red flags discussed.   -- Will forward to PCP.  Michelle Provencal, MD, PGY-3 [facility] 8:22 PM 03/02/2024

## 2024-03-03 ENCOUNTER — Ambulatory Visit (HOSPITAL_BASED_OUTPATIENT_CLINIC_OR_DEPARTMENT_OTHER)
Admission: RE | Admit: 2024-03-03 | Discharge: 2024-03-03 | Disposition: A | Discharge status: Home / Self Care | Source: Ambulatory Visit | Attending: Emergency Medicine | Admitting: Emergency Medicine

## 2024-03-03 DIAGNOSIS — M79605 Pain in left leg: Secondary | ICD-10-CM | POA: Insufficient documentation

## 2024-03-03 DIAGNOSIS — M7989 Other specified soft tissue disorders: Secondary | ICD-10-CM | POA: Diagnosis not present

## 2024-03-06 ENCOUNTER — Ambulatory Visit: Admitting: Family Medicine

## 2024-03-06 VITALS — BP 119/78 | HR 114 | Ht 62.0 in | Wt 162.0 lb

## 2024-03-06 DIAGNOSIS — D239 Other benign neoplasm of skin, unspecified: Secondary | ICD-10-CM | POA: Diagnosis present

## 2024-03-06 DIAGNOSIS — L700 Acne vulgaris: Secondary | ICD-10-CM | POA: Insufficient documentation

## 2024-03-06 NOTE — Patient Instructions (Signed)
 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 This is benign and not cancerous. Come back when you are able for an excisional vs punch biopsy so we can confirm the diagnosis. Let me know if you have any questions before then.

## 2024-03-06 NOTE — Progress Notes (Signed)
    SUBJECTIVE:   CHIEF COMPLAINT / HPI:   Cystic acne Has had these lesions on her back in the past. Previously saw a dermatologist at [facility]. She had an injection in one of the lesions, but it never went away, now for 8 years. The one place that remains is stable in size, but it itches. The color has stayed the same at pink color.  PERTINENT  PMH / PSH: recently got back from the beach  OBJECTIVE:   BP 119/78   Pulse (!) 114 (96 BPM on patient recheck at home)   Ht 5' 2 (1.575 m)   Wt 162 lb (73.5 kg)   LMP 08/14/2016 (Exact Date)   SpO2 100%   BMI 29.63 kg/m   General: Alert and oriented, in NAD Skin: Warm, dry, and intact; ovoid 2 to 3 cm firm lesion overlying left upper shoulder without tenderness to palpation, scale, erythema, drainage, bleeding; healing pustule at the 5 o'clock position relative to the ovoid lesion HEENT: NCAT, EOM grossly normal, midline nasal septum Respiratory: Breathing and speaking comfortably on RA Extremities: Moves all extremities grossly equally Neurological: No gross focal deficit Psychiatric: Appropriate mood and affect     ASSESSMENT/PLAN:   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  Reassured by lack of growth or change in morphology over multiple years.  Advised lesion almost certainly benign.  Through shared decision making, patient elected for punch biopsy instead of excisional biopsy to further characterize the lesion.  She will schedule this with me.   Stuart Redo, MD [facility]

## 2024-03-18 ENCOUNTER — Encounter: Payer: Self-pay | Admitting: Sports Medicine

## 2024-04-02 ENCOUNTER — Other Ambulatory Visit

## 2024-04-02 ENCOUNTER — Ambulatory Visit (INDEPENDENT_AMBULATORY_CARE_PROVIDER_SITE_OTHER): Payer: Medicaid Other | Admitting: Neurology

## 2024-04-02 ENCOUNTER — Encounter: Payer: Self-pay | Admitting: Neurology

## 2024-04-02 VITALS — BP 107/74 | HR 72 | Ht 62.0 in | Wt 160.0 lb

## 2024-04-02 DIAGNOSIS — R202 Paresthesia of skin: Secondary | ICD-10-CM

## 2024-04-02 DIAGNOSIS — G43009 Migraine without aura, not intractable, without status migrainosus: Secondary | ICD-10-CM | POA: Diagnosis not present

## 2024-04-02 MED ORDER — RIZATRIPTAN BENZOATE 10 MG PO TBDP: ORAL_TABLET | ORAL | 11 refills | Status: AC

## 2024-04-02 NOTE — Patient Instructions (Signed)
 Good to see you!  Have bloodwork done for TSH, B12, vitamin D levels  2. A prescription for as needed Maxalt  was sent to the pharmacy to take at the onset of migraine. May take second dose in 2 hours if needed. Do not take more than 3 a week  3. Follow-up in 1 year, call for any changes
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
## 2024-04-03 ENCOUNTER — Ambulatory Visit (INDEPENDENT_AMBULATORY_CARE_PROVIDER_SITE_OTHER): Admitting: Family Medicine

## 2024-04-03 VITALS — BP 108/74 | HR 102 | Wt 159.8 lb

## 2024-04-03 DIAGNOSIS — R110 Nausea: Secondary | ICD-10-CM | POA: Diagnosis not present

## 2024-04-03 DIAGNOSIS — L989 Disorder of the skin and subcutaneous tissue, unspecified: Secondary | ICD-10-CM | POA: Insufficient documentation

## 2024-04-03 DIAGNOSIS — L910 Hypertrophic scar: Secondary | ICD-10-CM | POA: Diagnosis not present

## 2024-04-03 MED ORDER — ONDANSETRON 4 MG PO TBDP
4.0000 mg | ORAL_TABLET | Freq: Once | ORAL | Status: AC
  Administered 2024-04-03: 4 mg via ORAL

## 2024-04-03 NOTE — Assessment & Plan Note (Addendum)
 Ddx includes dermatofibroma, epidermal cyst. Less likely cancerous due to consistent shape, size, and color of the lesion for 8 years.  - Punch biopsy performed today - consider excision in the future discussed depending on the biopsy result (She agreed with the plan) - The tissue was mostly fatty with no pus or dead tissues -Specimen sent to pathology - We will call the patient with the results. Gave her info about the procedure with precautions to look out for: bleeding, infection, oozing, pain. - Follow up as needed, in the future could consider excision  Skin Biopsy Procedure Note  PRE-OP DIAGNOSIS: Nodular skin lesion POST-OP DIAGNOSIS: Same  PROCEDURE: skin punch biopsy Performing Physician: Otto Fairly, MD, MPH Supervising Physician (if applicable):N/A  PROCEDURE:   Punch (Size 4mm)  The area surrounding the skin lesion was prepared and draped in the  usual sterile manner. 3 cc 1% Lidocaine  with Epi was infiltrated to the lesion. The lesion was removed in the usual manner by the  biopsy method noted above. Hemostasis was assured. The procedure was completed by brief nausea.  Closure: Pressure dressing with 2x2 gauze and tape. Bacitracin  ointment applied.  Followup: The patient tolerated the procedure well without  complications.  Standard post-procedure care is explained and return  precautions are given.

## 2024-04-03 NOTE — Patient Instructions (Signed)
 Removing a Small Tissue of Skin for Testing (Skin Biopsy): What to Know After After a small tissue of your skin is removed for testing, you may have soreness, mild pain, bruising, or itching in the areas that were cut. You may also have some redness and swelling. Follow these instructions at home: Caring for your cut from surgery  Take care of your cut as told. Make sure you: Wash your hands with soap and water for at least 20 seconds before and after you change your bandage. If you can't use soap and water, use hand sanitizer. Change your bandage. Leave stitches or skin glue alone. Leave tape strips alone unless you're told to take them off. You may trim the edges of the tape strips if they curl up. Check your cut every day for signs of infection. Check for: Redness, swelling, or pain. Fluid or blood. Warmth. Pus or a bad smell. General instructions Take your medicines only as told. Avoid heavy exercises and activities until the stitches are removed or the area heals. Avoid exposing the area to the sun until the wound has healed. Use sunscreen to protect the area from the sun after it has healed. You'll be told what to do to reduce scarring. Scarring should lessen over time. Do not take baths, swim, or use a hot tub until you're told it's OK. Ask if you can shower. Talk with your provider about your test results or treatment options. Ask if you need to have more tests. Ask what things are safe for you to do at home. Ask when you can go back to work or school. Contact a health care provider if: You have any signs of infection, including redness, swelling, or warmth around your cut from surgery. You have a fever. Your stitches, skin glue, or tape strips loosen or come off sooner than expected. Your cut from surgery opens up. Get help right away if: You have bleeding that doesn't stop even after you put pressure or a bandage. This information is not intended to replace advice given to you  by your health care provider. Make sure you discuss any questions you have with your health care provider. Document Revised: 10/08/2023 Document Reviewed: 09/06/2023 Elsevier Patient Education  2025 [employer].

## 2024-04-03 NOTE — Progress Notes (Signed)
 SUBJECTIVE:   CHIEF COMPLAINT / HPI:  Michelle [name] is a 33 yo with a pertinent hx of Ehlers Denlos and acne vulgaris presenting to derm clinic for L back rash  L upper back lesion:  She endorses a pink L upper back lesion that has been present for 8 years and was initially painful. Denies any change in color, shape, or size overtime. She reports having gone to [facility] in the past for injection (she does not know what was injected) to the area, which resolved her pain but the lesion appearance remained unchanged, though I do not see record of this in the chart.  She has not tried any OTC medication or been prescribed another remedy for this lesion. Denies hx of biopsy to the lesion. In the last 2 months it has started itching, and does not bleed when scratched. She takes cetirizine  for allergies, which does not improve this itching. Denies oozing, irritation, or pain.   Patient feels anxious today. She previously wanted a punch biopsy but now curious about an excisional biopsy.  Hx of back acne She also reports a hx of back acne for which she was also seen at [facility] for in the past, which did not respond to clindamycin, but resolved after her hystectomy in 2018.  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 Pulmonary: Normal respiratory effort on room air Psych: Anxious and fidgeting  Skin: Erythematous firm, smooth, circinate nodule with central area of yellow circular hypopigmentation on L upper back. About 3 cm. Non tender to palpation with surrounding mild  erythema. Does not appear infected.  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 Less likely cancerous due to consistent shape, size, and color of the lesion for 8 years.  - Punch biopsy performed today - consider excision in the future discussed depending on the biopsy result (She agreed with the plan) - The tissue was mostly fatty with no pus or dead tissues -Specimen sent to pathology - We will call the patient with the results. Gave her info about the procedure with precautions to look out for: bleeding, infection, oozing, pain. - Follow up as needed, in the future could consider excision  Skin Biopsy Procedure Note  PRE-OP DIAGNOSIS: Nodular skin lesion POST-OP DIAGNOSIS: Same  PROCEDURE: skin punch biopsy Performing Physician: Otto Fairly, MD, MPH Supervising Physician (if applicable):N/A  PROCEDURE:   Punch (Size 4mm)  The area surrounding the skin lesion was prepared and draped in the  usual sterile manner. 3 cc 1% Lidocaine  with Epi was infiltrated to the lesion. The lesion was removed in the usual manner by the  biopsy method noted above. Hemostasis was assured.  The procedure was completed by brief nausea.  Closure: Pressure dressing with 2x2 gauze and tape. Bacitracin  ointment applied.  Followup: The patient tolerated the procedure well without  complications.  Standard post-procedure care is explained and return  precautions are given.  Nausea After the prcoedure she became nauseous due to anxiety and was given Zofran  4 mg. She felt better and was able to ambulate home without difficulty.  This is a Psychologist, occupational Note.  The care of the  patient was discussed with Dr. Anders and the assessment and plan formulated with her assistance.  Please see her attestation of this encounter.  Duwaine Amber, MS3 [facility]  FMTS ATTENDING NOTE Otto Anders COME I evaluated this patient, including history taking and physical exam, independently and in conjunction with the medical student. I have edited the medical student's note and agree with the documentation. My edits are in blue highlights

## 2024-04-07 ENCOUNTER — Ambulatory Visit: Admitting: Family Medicine

## 2024-04-07 ENCOUNTER — Ambulatory Visit: Payer: Self-pay | Admitting: Family Medicine

## 2024-04-07 VITALS — BP 110/74 | HR 89 | Ht 62.0 in | Wt 160.0 lb

## 2024-04-07 DIAGNOSIS — I951 Orthostatic hypotension: Secondary | ICD-10-CM | POA: Diagnosis not present

## 2024-04-07 DIAGNOSIS — D8940 Mast cell activation, unspecified: Secondary | ICD-10-CM

## 2024-04-07 DIAGNOSIS — M2550 Pain in unspecified joint: Secondary | ICD-10-CM

## 2024-04-07 DIAGNOSIS — L910 Hypertrophic scar: Secondary | ICD-10-CM

## 2024-04-07 DIAGNOSIS — Q7962 Hypermobile Ehlers-Danlos syndrome: Secondary | ICD-10-CM | POA: Diagnosis not present

## 2024-04-07 DIAGNOSIS — Q796 Ehlers-Danlos syndrome, unspecified: Secondary | ICD-10-CM | POA: Diagnosis not present

## 2024-04-07 DIAGNOSIS — L989 Disorder of the skin and subcutaneous tissue, unspecified: Secondary | ICD-10-CM

## 2024-04-07 LAB — DERMATOLOGY PATHOLOGY

## 2024-04-07 MED ORDER — NORTRIPTYLINE HCL 25 MG PO CAPS: 25.0000 mg | ORAL_CAPSULE | Freq: Every day | ORAL | 0 refills | Status: DC

## 2024-04-07 NOTE — Telephone Encounter (Signed)
 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 I advised her that excision and recent biopsy can worsen keloids and will benefit from referral to a dermatologist for evaluation and treatment. She stated that she still bleeds a bit from the biopsy site and I advised her to contact the front office for an appointment soon for wound check if no improvement. No signs of infection which I went over with her. Referral to dermatology agreed upon and was ordered. She was appreciative of the call.

## 2024-04-07 NOTE — Progress Notes (Signed)
   LILLETTE Ileana Collet, PhD, LAT, ATC acting as a scribe for Artist Lloyd, MD.  Michelle [name] is a 33 y.o. 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  MS: chronic R hip pain Skin/Immune reactions: sensitive skin, hives, urticaria,  Head/Spine: chronic migraine, dizziness/lightheadedness,   Pertinent review of systems: No fevers or chills Current insomnia Relevant historical information: Mom with possible vascular EDS and with possible osteogenesis imperfecta.  Mom is significantly disabled and cannot leave the home easily. History of migraines  Exam:  BP 110/74   Pulse 89   Ht 5' 2 (1.575 m)   Wt 160 lb (72.6 kg)   LMP 08/14/2016 (Exact Date)   SpO2 99%   BMI 29.26 kg/m  General: Well Developed, well nourished, and in no acute distress.   MSK: Hypermobile     Assessment and Plan: 33 y.o. female with chronic musculoskeletal pain in multiple locations secondary to Ehlers-Danlos.  Plan to refer to PT.  Hopefully the focus of this can be switching to home exercise program.  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  For POTS recommend 3 L of water a day and 8 to 12 g of salt along with home exercise program using the [facility] or [facility] protocol.  For mast cell with chronic urticaria continue H1 and H2 blocker.  Consider adding cromolyn or montelukast.  Insomnia: Unclear etiology.  She does have a history of migraines and has taken nortriptyline  in the past.  Will try nortriptyline  at bedtime and see if that helps.  If needed we could switch to trazodone .  PDMP not reviewed this encounter. Orders Placed This Encounter  Procedures   Ambulatory referral to Physical Therapy    Referral Priority:   Routine    Referral Type:   Physical Medicine    Referral Reason:   Specialty Services Required     Requested Specialty:   Physical Therapy    Number of Visits Requested:   1   Meds ordered this encounter  Medications   nortriptyline  (PAMELOR ) 25 MG capsule    Sig: Take 1 capsule (25 mg total) by mouth at bedtime.    Dispense:  90 capsule    Refill:  0     Discussed warning signs or symptoms. Please see discharge instructions. Patient expresses understanding.   The above documentation has been reviewed and is accurate and complete Artist Lloyd, M.D.

## 2024-04-07 NOTE — Patient Instructions (Addendum)
 Thank you for coming in today.   Check out the book Disjointed Navigating the Diagnosis and Management of Hypermobile Ehlers-Danlos Syndrome and Hypermobility Spectrum Disorders.    For POTS symptoms: Consume 3 L water and 8-12 gram of salt per day   Check out these websites for exercises to try if you have POTS ([facility] Protocol, [place] Protocol, and Levine Protocol) https://www.taylor-robbins.com/ https://dean.info/   A referral for physical therapy has been submitted. A representative from the physical therapy office will contact you to coordinate scheduling after confirming your benefits with your insurance provider. If you do not hear from the physical therapy office within the next 1-2 weeks, please let us  know.   Start Nortriptyline  at bedtime to see if it helps with sleep.   See you back in 1 month.

## 2024-04-08 ENCOUNTER — Ambulatory Visit (INDEPENDENT_AMBULATORY_CARE_PROVIDER_SITE_OTHER): Admitting: Family Medicine

## 2024-04-08 ENCOUNTER — Encounter: Payer: Self-pay | Admitting: Family Medicine

## 2024-04-08 VITALS — BP 104/78 | HR 84 | Temp 98.1°F | Ht 62.0 in | Wt 158.2 lb

## 2024-04-08 DIAGNOSIS — L989 Disorder of the skin and subcutaneous tissue, unspecified: Secondary | ICD-10-CM

## 2024-04-08 NOTE — Assessment & Plan Note (Signed)
 No concern for postbiopsy skin infection or continued bleeding.  Biopsy site has yet to scab over but expect this to continue to progress with normal healing.  No active bleeding to consider silver  nitrate today.  Follow-up as needed.

## 2024-04-08 NOTE — Progress Notes (Signed)
    SUBJECTIVE:   CHIEF COMPLAINT / HPI: Bleeding from biopsy site  Site has continued to bleed only on bandaid. No other symptoms other than itchiness. No redness or swelling. No fevers. Sent in by Dr. Anders  PERTINENT  PMH / PSH: Dysautonomia, Ehlers-Danlos, bipolar  OBJECTIVE:   BP 104/78   Pulse 84   Temp 98.1 F (36.7 C) (Oral)   Ht 5' 2 (1.575 m)   Wt 158 lb 4 oz (71.8 kg)   LMP 08/14/2016 (Exact Date)   SpO2 100%   BMI 28.94 kg/m   General: NAD, well appearing Neuro: A&O Respiratory: normal WOB on RA Extremities: Moving all 4 extremities equally Back: in progress healing biopsy site   ASSESSMENT/PLAN:   Assessment & Plan Back skin lesion No concern for postbiopsy skin infection or continued bleeding.  Biopsy site has yet to scab over but expect this to continue to progress with normal healing.  No active bleeding to consider silver  nitrate today.  Follow-up as needed.   Ozell Provencal, MD [facility]

## 2024-04-08 NOTE — Telephone Encounter (Signed)
 Forwarding to Dr. Denyse Amass to review and advise.

## 2024-04-09 NOTE — Telephone Encounter (Signed)
 That should probably be okay however can you send me a link to the recipe so I can look it over myself and make sure that the basic ratio of electrolytes is reasonable?

## 2024-04-09 NOTE — Telephone Encounter (Signed)
 Forwarding to Dr. Denyse Amass to review and advise.

## 2024-04-10 ENCOUNTER — Ambulatory Visit: Payer: Self-pay | Admitting: Neurology

## 2024-04-10 LAB — VITAMIN B12: Vitamin B-12: 277 pg/mL (ref 200–1100)

## 2024-04-10 LAB — TSH: TSH: 1 m[IU]/L

## 2024-04-10 LAB — VITAMIN D 1,25 DIHYDROXY
Vitamin D 1, 25 (OH)2 Total: 69 pg/mL (ref 18–72)
Vitamin D2 1, 25 (OH)2: <8 pg/mL
Vitamin D3 1, 25 (OH)2: 69 pg/mL

## 2024-05-01 ENCOUNTER — Emergency Department (HOSPITAL_BASED_OUTPATIENT_CLINIC_OR_DEPARTMENT_OTHER)
Admission: EM | Admit: 2024-05-01 | Discharge: 2024-05-01 | Disposition: A | Discharge status: Home / Self Care | Attending: Emergency Medicine | Admitting: Emergency Medicine

## 2024-05-01 ENCOUNTER — Other Ambulatory Visit: Payer: Self-pay

## 2024-05-01 DIAGNOSIS — R002 Palpitations: Secondary | ICD-10-CM | POA: Insufficient documentation

## 2024-05-01 LAB — CBC WITH DIFFERENTIAL/PLATELET
Abs Immature Granulocytes: 0.03 K/uL (ref 0.00–0.07)
Basophils Absolute: 0 K/uL (ref 0.0–0.1)
Basophils Relative: 1 %
Eosinophils Absolute: 0.1 K/uL (ref 0.0–0.5)
Eosinophils Relative: 1 %
HCT: 39 % (ref 36.0–46.0)
Hemoglobin: 13.5 g/dL (ref 12.0–15.0)
Immature Granulocytes: 0 %
Lymphocytes Relative: 35 %
Lymphs Abs: 2.7 K/uL (ref 0.7–4.0)
MCH: 31.3 pg (ref 26.0–34.0)
MCHC: 34.6 g/dL (ref 30.0–36.0)
MCV: 90.3 fL (ref 80.0–100.0)
Monocytes Absolute: 0.8 K/uL (ref 0.1–1.0)
Monocytes Relative: 10 %
Neutro Abs: 4.2 K/uL (ref 1.7–7.7)
Neutrophils Relative %: 53 %
Platelets: 263 K/uL (ref 150–400)
RBC: 4.32 MIL/uL (ref 3.87–5.11)
RDW: 11.5 % (ref 11.5–15.5)
WBC: 7.8 K/uL (ref 4.0–10.5)
nRBC: 0 % (ref 0.0–0.2)

## 2024-05-01 LAB — BASIC METABOLIC PANEL WITH GFR
Anion gap: 11 (ref 5–15)
BUN: 9 mg/dL (ref 6–20)
CO2: 25 mmol/L (ref 22–32)
Calcium: 9.6 mg/dL (ref 8.9–10.3)
Chloride: 102 mmol/L (ref 98–111)
Creatinine, Ser: 0.7 mg/dL (ref 0.44–1.00)
GFR, Estimated: >60 mL/min (ref >60)
Glucose, Bld: 97 mg/dL (ref 70–99)
Potassium: 3.7 mmol/L (ref 3.5–5.1)
Sodium: 138 mmol/L (ref 135–145)

## 2024-05-01 LAB — PREGNANCY, URINE: Preg Test, Ur: NEGATIVE

## 2024-05-01 LAB — TSH: TSH: 1.79 u[IU]/mL (ref 0.350–4.500)

## 2024-05-01 LAB — D-DIMER, QUANTITATIVE: D-Dimer, Quant: <0.27 ug{FEU}/mL (ref 0.00–0.50)

## 2024-05-01 LAB — TROPONIN T, HIGH SENSITIVITY: Troponin T High Sensitivity: <15 ng/L (ref 0–19)

## 2024-05-01 MED ORDER — SODIUM CHLORIDE 0.9 % IV BOLUS
1000.0000 mL | Freq: Once | INTRAVENOUS | Status: AC
  Administered 2024-05-01: 1000 mL via INTRAVENOUS

## 2024-05-01 NOTE — ED Triage Notes (Signed)
 Pt caox4 ambulatory c/o palpitations, increased HR, and feeling jittery since approx 0930-10a this morning. PMH POTS. Denies CP or SOB. Denies flu-like s/s.

## 2024-05-01 NOTE — Discharge Instructions (Addendum)
 While you were in the emergency room, you had blood work done that was normal.  Your palpitations could be related to your POTS.  Drink plenty of water.  Follow-up with your primary care doctor.

## 2024-05-01 NOTE — ED Notes (Signed)
 Reviewed discharge instructions and home care with pt. Pt verbalized understanding and had no further questions. Pt exited ED without complications.

## 2024-05-01 NOTE — ED Provider Notes (Signed)
 [place] EMERGENCY DEPARTMENT AT [facility] Provider Note   CSN: [id number] Arrival date & time: 05/01/24  1154     Patient presents with: Palpitations   Michelle [name] is a 33 y.o. female.   This is a 33 year old female with a history of POTS who is here today for palpitations and tachycardia.  Patient denies chest pain or shortness of breath.  Denies recent travel, oral contraceptives or pain in her legs.  Fever, no chills.   Palpitations      Prior to Admission medications   Medication Sig Start Date End Date Taking? Authorizing Provider  albuterol  (VENTOLIN  HFA) 108 (90 Base) MCG/ACT inhaler Inhale 2 puffs into the lungs every 4 (four) hours as needed for wheezing or shortness of breath. 12/21/23   Dameron, Marisa, DO  cetirizine  (ZYRTEC ) 10 MG tablet Take 1 tablet (10 mg total) by mouth daily. 12/21/23   Dameron, Marisa, DO  EPIPEN  2-PAK 0.3 MG/0.3ML SOAJ injection Inject 0.3 mg into the muscle once as needed for anaphylaxis. 12/21/23   Dameron, Marisa, DO  famotidine  (PEPCID ) 20 MG tablet Take 1 tablet (20 mg total) by mouth daily. 12/21/23   Dameron, Marisa, DO  fluticasone  (FLONASE ) 50 MCG/ACT nasal spray Place 2 sprays into both nostrils daily. 12/21/23   Dameron, Marisa, DO  HYDROcodone -acetaminophen  (NORCO/VICODIN) 5-325 MG tablet Take 1 tablet by mouth every 6 (six) hours as needed for severe pain (pain score 7-10). Patient not taking: Reported on 04/07/2024 01/16/24   Harris, Abigail, PA-C  nortriptyline  (PAMELOR ) 25 MG capsule Take 1 capsule (25 mg total) by mouth at bedtime. 04/07/24   Corey, Evan S, MD  polyethylene glycol powder (MIRALAX ) 17 GM/SCOOP powder Take 17 g by mouth daily as needed (constipation). 01/22/24   Cleotilde Lukes, DO  rizatriptan  (MAXALT -MLT) 10 MG disintegrating tablet Take 1 tablet at onset of migraine. May repeat in 2 hours if needed. Do not take more than 3 a week 04/02/24   Georjean Darice HERO, MD  senna (SENOKOT) 8.6 MG TABS tablet Take 1 tablet  (8.6 mg total) by mouth daily. 01/22/24   Cleotilde Lukes, DO  tiZANidine  (ZANAFLEX ) 4 MG tablet Take 1 tablet (4 mg total) by mouth every 8 (eight) hours as needed for muscle spasms. Patient not taking: Reported on 04/07/2024 02/07/24   Tharon Lung, MD    Allergies: Adhesive [tape], Bee venom, Reglan  [metoclopramide ], and Amoxil  [amoxicillin ]    Review of Systems  Cardiovascular:  Positive for palpitations.    Updated Vital Signs BP 95/69   Pulse (!) 120   Temp 98.3 F (36.8 C) (Oral)   Resp (!) 24   LMP 08/14/2016 (Exact Date)   SpO2 95%   Physical Exam Vitals and nursing note reviewed.  Constitutional:      Appearance: She is not toxic-appearing.  Eyes:     Pupils: Pupils are equal, round, and reactive to light.  Cardiovascular:     Rate and Rhythm: Tachycardia present.     Pulses: Normal pulses.     Heart sounds: No murmur heard. Pulmonary:     Effort: Pulmonary effort is normal.  Abdominal:     General: Abdomen is flat.     Palpations: Abdomen is soft.  Musculoskeletal:        General: Normal range of motion.     Cervical back: Normal range of motion.  Skin:    General: Skin is warm.  Neurological:     General: No focal deficit present.     Mental  Status: She is alert.     Cranial Nerves: No cranial nerve deficit.     Motor: No weakness.     Gait: Gait normal.     (all labs ordered are listed, but only abnormal results are displayed) Labs Reviewed  BASIC METABOLIC PANEL WITH GFR  CBC WITH DIFFERENTIAL/PLATELET  D-DIMER, QUANTITATIVE  TSH  PREGNANCY, URINE  TROPONIN T, HIGH SENSITIVITY    EKG: EKG Interpretation Date/Time:  Thursday May 01 2024 12:05:46 EDT Ventricular Rate:  117 PR Interval:  126 QRS Duration:  83 QT Interval:  308 QTC Calculation: 430 R Axis:   71  Text Interpretation: Sinus tachycardia Ventricular premature complex Aberrant conduction of SV complex(es) Low voltage, precordial leads Borderline T abnormalities, anterior  leads Confirmed by Mannie Pac [phone]) on 05/01/2024 12:49:21 PM  Radiology: No results found.   Ultrasound ED Echo  Date/Time: 05/01/2024 12:50 PM  Performed by: Mannie Pac DASEN, DO Authorized by: Mannie Pac DASEN, DO   Procedure details:    Indications: chest pain     Views: subxiphoid and IVC view     Images: archived     Limitations:  Body habitus Findings:    Pericardium: no pericardial effusion     LV Function: normal (>50% EF)     RV Diameter: normal     IVC: normal   Impression:    Impression: normal      Medications Ordered in the ED  sodium chloride  0.9 % bolus 1,000 mL (1,000 mLs Intravenous New Bag/Given 05/01/24 1221)                                    Medical Decision Making 33 year old female here today for palpitations.  Differential diagnoses include POTS, PVCs, PACs, dehydration, PE, hyperthyroid.  Plan-patient's EKG, per my independent review shows sinus tachycardia, without evidence of acute ischemia.  She has no infectious symptoms.  Given her tachycardia, did obtain D-dimer which was undetectable.  TSH normal.  Troponin less than 15.  No anemia. Negative urine pregnancy. Given patient's absence of additional cardiac risk factors, do not believe a delta troponin is required.  Bedside ultrasound showed normal wall function, no pericardial effusion.   Symptoms likely related to patient's POTS.  Provided her with some fluids.  Patient discharged with return precautions.  Amount and/or Complexity of Data Reviewed Labs: ordered.        Final diagnoses:  Palpitations    ED Discharge Orders     None          Mannie Pac DASEN, DO 05/01/24 1251

## 2024-05-05 ENCOUNTER — Encounter: Payer: Self-pay | Admitting: Family Medicine

## 2024-05-05 ENCOUNTER — Ambulatory Visit (INDEPENDENT_AMBULATORY_CARE_PROVIDER_SITE_OTHER): Admitting: Family Medicine

## 2024-05-05 VITALS — BP 118/80 | HR 117 | Ht 62.0 in | Wt 162.0 lb

## 2024-05-05 DIAGNOSIS — I951 Orthostatic hypotension: Secondary | ICD-10-CM

## 2024-05-05 DIAGNOSIS — R109 Unspecified abdominal pain: Secondary | ICD-10-CM | POA: Diagnosis not present

## 2024-05-05 DIAGNOSIS — D8940 Mast cell activation, unspecified: Secondary | ICD-10-CM | POA: Diagnosis not present

## 2024-05-05 MED ORDER — METOPROLOL TARTRATE 25 MG PO TABS: 12.5000 mg | ORAL_TABLET | Freq: Two times a day (BID) | ORAL | 3 refills | Status: DC

## 2024-05-05 NOTE — Patient Instructions (Addendum)
 Thank you for coming in today.   Continue salt and water recommendations  Maximize Zyrtec  & Pepcid   Let me know if not better and I can prescribe cromolyn  Check back in 1 month

## 2024-05-05 NOTE — Progress Notes (Signed)
 I, Leotis Batter, CMA acting as a scribe for Artist Lloyd, MD.  Michelle [name] is a 33 y.o. female who presents to [facility] at [facility] today for f/u hEDS, dysautonomia, and MCAS. Pt was last seen by Dr. Lloyd on 04/07/24 and was referred to PT. Also advised on fluid/salt intake, exercise protocol, and prescribed nortriptyline .   Today, pt reports 1st visit with PT will be 05/17/24. Has been compliant with increased salt and fluid intake. Has questions about home made electrolyte drink. Was in the ED this past Thursday for palpitations. Had not tried POTS exercises. Has been under stress dealing with 33 y/o who has been dealing with chronic joint pain and pos ANA. Taking Nortriptyline , tolerating well but notes minimal change in sleep habits. Continues to have dizziness and intermittent palpitations.   She has had prior evaluation for palpitation and tachycardia in 2017 and 2018 with a Holter monitor and echocardiogram that were reportedly unremarkable.  She reports having been on a beta-blocker in the past.  She cannot quite remember which one it was.  Pertinent review of systems: No fevers or chills.  Positive for abdominal cramping and discomfort and constipation notable especially after eating.  She has already tried MiraLAX .  She is interested in a gastroenterology referral  Relevant historical information: Dysautonomia and Ehlers-Danlos syndrome.   Exam:  BP 118/80   Pulse (!) 117   Ht 5' 2 (1.575 m)   Wt 162 lb (73.5 kg)   LMP 08/14/2016 (Exact Date)   SpO2 98%   BMI 29.63 kg/m  General: Well Developed, well nourished, and in no acute distress.   MSK: Normal gait    Lab and Radiology Results No results found for this or any previous visit (from the past 72 hours). No results found.     Assessment and Plan: 33 y.o. 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  POTS is very noticeable and bad enough that she had an emergency room visit recently for palpitation.  We discussed fluid and salt goals.  She is already doing a pretty good job with that already.  Prescribed metoprolol  low-dose.  Plan on rechecking in 1 month.  Mast cell: Patient notes intermittent hives and rash and abdominal discomfort.  She already is taking Zyrtec  and famotidine  at low-dose.  We talked about maximizing dose.  She will report back in 1 week if not improved will add cromolyn oral solution.  Abdominal cramping and intermittent constipation.  Likely secondary to mast cell.  Diagnosis could also be chronic idiopathic constipation or IBS-C.  Patient requested gastroenterology referral which does make sense.  She has not had formal evaluation yet including colonoscopy.  She is taking MiraLAX  already.  We talked about trying to avoid exacerbating food.  Recheck in 1 month.   PDMP not reviewed this encounter. Orders Placed This Encounter  Procedures   Ambulatory referral to Gastroenterology    Referral Priority:   Routine    Referral Type:   Consultation    Referral Reason:   Specialty Services Required    Number of Visits Requested:   1   Meds ordered this encounter  Medications   metoprolol  tartrate (LOPRESSOR ) 25 MG tablet    Sig: Take 0.5 tablets (12.5 mg total) by mouth 2 (two) times daily.    Dispense:  60 tablet    Refill:  3     Discussed warning signs or symptoms. Please  see discharge instructions. Patient expresses understanding.   The above documentation has been reviewed and is accurate and complete Artist Lloyd, M.D.

## 2024-05-06 ENCOUNTER — Ambulatory Visit (INDEPENDENT_AMBULATORY_CARE_PROVIDER_SITE_OTHER): Admitting: Family Medicine

## 2024-05-06 ENCOUNTER — Other Ambulatory Visit: Payer: Self-pay | Admitting: Family Medicine

## 2024-05-06 ENCOUNTER — Ambulatory Visit: Attending: Family Medicine

## 2024-05-06 ENCOUNTER — Encounter: Payer: Self-pay | Admitting: Family Medicine

## 2024-05-06 VITALS — BP 114/79 | HR 117 | Ht 62.0 in | Wt 161.0 lb

## 2024-05-06 DIAGNOSIS — G90A Postural orthostatic tachycardia syndrome (POTS): Secondary | ICD-10-CM

## 2024-05-06 DIAGNOSIS — R002 Palpitations: Secondary | ICD-10-CM

## 2024-05-06 DIAGNOSIS — R000 Tachycardia, unspecified: Secondary | ICD-10-CM

## 2024-05-06 NOTE — Progress Notes (Signed)
   SUBJECTIVE:   CHIEF COMPLAINT / HPI:  Discussed the use of AI scribe software for clinical note transcription with the patient, who gave verbal consent to proceed.  History of Present Illness Michelle [name] is a 33 year old female with POTS who presents with concerns about sinus tachycardia and recent EKG findings.  She experiences a consistently high heart rate, with episodes reaching 150-160 bpm. She has not started metoprolol  but has it available. She experiences random palpitations, sometimes resolving with rest, but recently had an episode lasting over an hour, prompting an emergency department visit. During this episode, she felt her heartbeat behind her ear. She occasionally experiences quick, mild chest pain but no consistent chest pain or shortness of breath.  Her past medical history includes POTS. She has a family history of AFib, with her mother having strokes related to this condition. She is concerned about genetic predispositions but does not believe she has AFib.  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 She also takes B12 supplements as recommended by her neurologist due to low-normal levels for neurological health.  Recent lab work from the emergency department showed normal potassium levels and thyroid  function.   OBJECTIVE:  BP 114/79   Pulse (!) 117   Ht 5' 2 (1.575 m)   Wt 161 lb (73 kg)   LMP 08/14/2016 (Exact Date)   SpO2 99%   BMI 29.45 kg/m   Physical Exam GENERAL: Alert, cooperative, well developed, no acute distress. HEENT: Normocephalic, normal oropharynx, moist mucous membranes. CHEST: Clear to auscultation bilaterally, no wheezes, rhonchi, or crackles. CARDIOVASCULAR: Heart rate 94 beats per minute on my exam, normal rhythm, S1 and S2 normal without murmurs. ABDOMEN: Soft, non-distended, without organomegaly, normal bowel  sounds. EXTREMITIES: No cyanosis or edema. NEUROLOGICAL: Cranial nerves grossly intact, moves all extremities without gross motor or sensory deficit.  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 EKG from the ED shows sinus tachycardia with good conduction and no significant abnormalities. Potassium level is 3.7, thyroid  function is normal, and there is no anemia. I do not feel another EKG is needed today given similar symptoms to 05/01/24. Start metoprolol  half tablet BID as prescribed by Dr. Joane. Order a Zio patch for 14 days of monitoring after shared decision making about benefits of monitoring and the risk of allergic reaction to the adhesive. Continue antihistamines at high dose which I hope will reduce this risk. Continue electrolyte supplementation for general POTS treatment.  Stuart Redo, MD [facility]

## 2024-05-06 NOTE — Patient Instructions (Addendum)
 VISIT SUMMARY: You came in today because of concerns about your high heart rate and recent EKG findings. We discussed your symptoms, including episodes of rapid heart rate and palpitations, and reviewed your recent emergency department visit.  YOUR PLAN: POSTURAL ORTHOSTATIC TACHYCARDIA SYNDROME (POTS) WITH SINUS TACHYCARDIA: You have a condition called POTS, which causes your heart rate to increase significantly when you stand up. Your recent EKG showed sinus tachycardia, meaning your heart is beating faster than normal but in a regular rhythm. -Start taking metoprolol , half a tablet twice a day, as prescribed by your other physician. -We will use a Zio patch to monitor your heart for 14 days. -Continue taking electrolyte supplementation -Watch for any adverse reactions to the Zio patch and let us  know if you experience any issues.  ALLERGY  TO ADHESIVE: You have a known allergy  to adhesive, which causes severe skin reactions like hives and rashes. -Pre-treat with cetirizine  20 mg daily and famotidine  40 mg daily before applying the Zio patch. -Monitor for any skin reactions while using the Zio patch and stop using it if you have a severe reaction.  Please let me know if you have any other questions.  Dr. Tharon

## 2024-05-06 NOTE — Progress Notes (Unsigned)
 EP to read.

## 2024-05-07 ENCOUNTER — Encounter: Payer: Self-pay | Admitting: Family Medicine

## 2024-05-09 ENCOUNTER — Telehealth: Payer: Self-pay

## 2024-05-09 NOTE — Telephone Encounter (Signed)
 Patient calls nurse line in regards to zio patch.  She reports she placed this last night, however she reports irritation to the area. She reports she has a hx of adhesive reaction.   She reports her skin is red and inflamed. She reports itching with a burning sensation.  No fevers or chills.   Patient advised to contact customer service for any tips or tricks. Patient to let me know their recommendations.   Will forward to PCP.

## 2024-05-09 NOTE — Telephone Encounter (Signed)
 Patient returns call to nurse line.   She reports she spoke with customer service.   She reports they advised her to wear the monitor as long as possible. They advised she may take it off at any time, per her discretion.   She reports she does want to try and wear it as long as she can. She will call us  to let us  know if she ends up taking it off.  Will forward to PCP for update.

## 2024-05-12 ENCOUNTER — Encounter: Payer: Self-pay | Admitting: Family Medicine

## 2024-05-13 ENCOUNTER — Encounter: Payer: Self-pay | Admitting: Family Medicine

## 2024-05-13 DIAGNOSIS — D8940 Mast cell activation, unspecified: Secondary | ICD-10-CM

## 2024-05-13 DIAGNOSIS — M25551 Pain in right hip: Secondary | ICD-10-CM | POA: Diagnosis not present

## 2024-05-13 MED ORDER — CROMOLYN SODIUM 100 MG/5ML PO CONC: 100.0000 mg | Freq: Four times a day (QID) | ORAL | 2 refills | Status: DC

## 2024-05-15 DIAGNOSIS — R002 Palpitations: Secondary | ICD-10-CM

## 2024-05-15 DIAGNOSIS — R000 Tachycardia, unspecified: Secondary | ICD-10-CM

## 2024-05-15 DIAGNOSIS — G90A Postural orthostatic tachycardia syndrome (POTS): Secondary | ICD-10-CM | POA: Diagnosis not present

## 2024-05-16 ENCOUNTER — Ambulatory Visit: Payer: Self-pay | Admitting: Student

## 2024-05-19 ENCOUNTER — Ambulatory Visit (HOSPITAL_BASED_OUTPATIENT_CLINIC_OR_DEPARTMENT_OTHER): Admitting: Physical Therapy
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
## 2024-05-26 ENCOUNTER — Encounter (HOSPITAL_BASED_OUTPATIENT_CLINIC_OR_DEPARTMENT_OTHER): Payer: Self-pay | Admitting: Physical Therapy

## 2024-05-26 ENCOUNTER — Ambulatory Visit (HOSPITAL_BASED_OUTPATIENT_CLINIC_OR_DEPARTMENT_OTHER): Attending: Family Medicine | Admitting: Physical Therapy

## 2024-05-26 DIAGNOSIS — Q7962 Hypermobile Ehlers-Danlos syndrome: Secondary | ICD-10-CM | POA: Diagnosis not present

## 2024-05-26 DIAGNOSIS — R2689 Other abnormalities of gait and mobility: Secondary | ICD-10-CM | POA: Insufficient documentation
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
## 2024-05-27 ENCOUNTER — Encounter (HOSPITAL_BASED_OUTPATIENT_CLINIC_OR_DEPARTMENT_OTHER): Payer: Self-pay | Admitting: Physical Therapy

## 2024-05-29 ENCOUNTER — Ambulatory Visit (HOSPITAL_BASED_OUTPATIENT_CLINIC_OR_DEPARTMENT_OTHER): Admitting: Physical Therapy

## 2024-06-02 ENCOUNTER — Ambulatory Visit (HOSPITAL_BASED_OUTPATIENT_CLINIC_OR_DEPARTMENT_OTHER): Admitting: Physical Therapy

## 2024-06-02 ENCOUNTER — Encounter (HOSPITAL_BASED_OUTPATIENT_CLINIC_OR_DEPARTMENT_OTHER): Payer: Self-pay | Admitting: Physical Therapy

## 2024-06-02 DIAGNOSIS — R2689 Other abnormalities of gait and mobility: Secondary | ICD-10-CM | POA: Diagnosis not present
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
## 2024-06-03 ENCOUNTER — Encounter: Payer: Self-pay | Admitting: Neurology

## 2024-06-05 ENCOUNTER — Ambulatory Visit: Admitting: Family Medicine

## 2024-06-05 VITALS — BP 116/82 | HR 100 | Ht 62.0 in | Wt 166.0 lb

## 2024-06-05 DIAGNOSIS — Q7962 Hypermobile Ehlers-Danlos syndrome: Secondary | ICD-10-CM | POA: Diagnosis not present

## 2024-06-05 DIAGNOSIS — D8940 Mast cell activation, unspecified: Secondary | ICD-10-CM

## 2024-06-05 DIAGNOSIS — I951 Orthostatic hypotension: Secondary | ICD-10-CM | POA: Diagnosis not present

## 2024-06-05 MED ORDER — TIZANIDINE HCL 2 MG PO TABS: 2.0000 mg | ORAL_TABLET | Freq: Three times a day (TID) | ORAL | 3 refills | Status: AC | PRN

## 2024-06-05 MED ORDER — MIDODRINE HCL 2.5 MG PO TABS: 2.5000 mg | ORAL_TABLET | Freq: Three times a day (TID) | ORAL | 3 refills | Status: DC

## 2024-06-05 NOTE — Patient Instructions (Addendum)
Thank you for coming in today.   Continue physical therapy  Check back in 1 month

## 2024-06-05 NOTE — Progress Notes (Signed)
   LILLETTE Ileana Collet, PhD, LAT, ATC acting as a scribe for Artist Lloyd, MD.  Michelle [name] is a 33 y.o. female who presents to [facility] today for f/u EDS, abdominal cramping, dysautonomia, and MCAS. Pt was last seen by Dr. Lloyd on 05/05/24 and was advised on fluid/salt intake and prescribed metoprolol . Also referred to GI. Later prescribed cromolyn.  Today, pt reports she tried the cromolyn but was getting HA as a side effect. Metoprolol  in the mornings as her BP around 85/50's. She's hurting a good bit over the last couple weeks. Fatigues, difficulty sleeping. Increase neck pain. She also has noticed weight gain.  Pertinent review of systems: No fevers or chills  Relevant historical information: POTS   Exam:  BP 116/82   Pulse 100   Ht 5' 2 (1.575 m)   Wt 166 lb (75.3 kg)   LMP 08/14/2016 (Exact Date)   SpO2 99%   BMI 30.36 kg/m  General: Well Developed, well nourished, and in no acute distress.   MSK: Excessive lumbar motion.  Normal cervical motion.    Lab and Radiology Results No results found for this or any previous visit (from the past 72 hours). No results found.     Assessment and Plan: 33 y.o. female with continued musculoskeletal pain from hypermobility and Ehlers-Danlos.  Currently engaged with physical therapy which overall I think should be helpful.  Plan to continue PT.  I have prescribed tizanidine  for use at bedtime.  This may be helpful.  POTS/dysautonomia remains a large problem.  Metoprolol  and salt and fluid have been insufficient.  Metoprolol  does help with palpitations but she is still feeling lightheadedness and dizziness.  Will add midodrine.  Recheck in 1 month.   PDMP not reviewed this encounter. No orders of the defined types were placed in this encounter.  Meds ordered this encounter  Medications   midodrine (PROAMATINE) 2.5 MG tablet    Sig: Take 1 tablet (2.5 mg total) by mouth 3 (three) times daily  with meals.    Dispense:  90 tablet    Refill:  3   tiZANidine  (ZANAFLEX ) 2 MG tablet    Sig: Take 1-2 tablets (2-4 mg total) by mouth every 8 (eight) hours as needed.    Dispense:  60 tablet    Refill:  3     Discussed warning signs or symptoms. Please see discharge instructions. Patient expresses understanding.   The above documentation has been reviewed and is accurate and complete Artist Lloyd, M.D.

## 2024-06-09 ENCOUNTER — Ambulatory Visit (HOSPITAL_BASED_OUTPATIENT_CLINIC_OR_DEPARTMENT_OTHER): Admitting: Physical Therapy

## 2024-06-09 ENCOUNTER — Encounter (HOSPITAL_BASED_OUTPATIENT_CLINIC_OR_DEPARTMENT_OTHER): Payer: Self-pay | Admitting: Physical Therapy

## 2024-06-09 DIAGNOSIS — R2689 Other abnormalities of gait and mobility: Secondary | ICD-10-CM
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
## 2024-06-10 ENCOUNTER — Encounter (HOSPITAL_BASED_OUTPATIENT_CLINIC_OR_DEPARTMENT_OTHER): Payer: Self-pay | Admitting: Physical Therapy

## 2024-06-13 ENCOUNTER — Encounter: Payer: Self-pay | Admitting: Family Medicine

## 2024-06-16 NOTE — Telephone Encounter (Signed)
 Forwarding to Dr. Denyse Amass to review and advise.

## 2024-06-17 ENCOUNTER — Ambulatory Visit (HOSPITAL_BASED_OUTPATIENT_CLINIC_OR_DEPARTMENT_OTHER): Admitting: Physical Therapy

## 2024-06-17 ENCOUNTER — Encounter (HOSPITAL_BASED_OUTPATIENT_CLINIC_OR_DEPARTMENT_OTHER): Payer: Self-pay | Admitting: Physical Therapy

## 2024-06-17 DIAGNOSIS — R2689 Other abnormalities of gait and mobility: Secondary | ICD-10-CM | POA: Insufficient documentation
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
## 2024-06-18 ENCOUNTER — Encounter (HOSPITAL_BASED_OUTPATIENT_CLINIC_OR_DEPARTMENT_OTHER): Payer: Self-pay | Admitting: Physical Therapy

## 2024-06-18 ENCOUNTER — Ambulatory Visit (HOSPITAL_BASED_OUTPATIENT_CLINIC_OR_DEPARTMENT_OTHER): Admitting: Physical Therapy

## 2024-06-18 DIAGNOSIS — R2689 Other abnormalities of gait and mobility: Secondary | ICD-10-CM | POA: Diagnosis not present

## 2024-06-18 MED ORDER — PROPRANOLOL HCL ER 60 MG PO CP24: 60.0000 mg | ORAL_CAPSULE | Freq: Every day | ORAL | 3 refills | Status: AC
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
## 2024-06-18 NOTE — Addendum Note (Signed)
 Addended by: JOANE ARTIST RAMAN on: 06/18/2024 07:11 AM   Modules accepted: Orders

## 2024-06-20 ENCOUNTER — Ambulatory Visit (HOSPITAL_BASED_OUTPATIENT_CLINIC_OR_DEPARTMENT_OTHER): Admitting: Physical Therapy

## 2024-06-23 ENCOUNTER — Telehealth (HOSPITAL_BASED_OUTPATIENT_CLINIC_OR_DEPARTMENT_OTHER): Payer: Self-pay | Admitting: Physical Therapy

## 2024-06-23 ENCOUNTER — Ambulatory Visit (HOSPITAL_BASED_OUTPATIENT_CLINIC_OR_DEPARTMENT_OTHER): Admitting: Physical Therapy
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
## 2024-06-24 ENCOUNTER — Encounter: Payer: Self-pay | Admitting: Nurse Practitioner

## 2024-06-24 ENCOUNTER — Other Ambulatory Visit

## 2024-06-24 ENCOUNTER — Ambulatory Visit: Admitting: Nurse Practitioner

## 2024-06-24 VITALS — BP 104/68 | HR 72 | Ht 62.0 in | Wt 165.0 lb

## 2024-06-24 DIAGNOSIS — K5904 Chronic idiopathic constipation: Secondary | ICD-10-CM | POA: Diagnosis not present

## 2024-06-24 DIAGNOSIS — R1032 Left lower quadrant pain: Secondary | ICD-10-CM | POA: Diagnosis not present

## 2024-06-24 DIAGNOSIS — K760 Fatty (change of) liver, not elsewhere classified: Secondary | ICD-10-CM | POA: Diagnosis not present

## 2024-06-24 DIAGNOSIS — K5909 Other constipation: Secondary | ICD-10-CM

## 2024-06-24 LAB — SEDIMENTATION RATE: Sed Rate: 31 mm/h — ABNORMAL HIGH (ref 0–20)

## 2024-06-24 LAB — HIGH SENSITIVITY CRP: CRP, High Sensitivity: 4.07 mg/L (ref 0.000–5.000)

## 2024-06-24 MED ORDER — LINACLOTIDE 72 MCG PO CAPS: 72.0000 ug | ORAL_CAPSULE | Freq: Every day | ORAL | 0 refills | Status: DC

## 2024-06-24 NOTE — Progress Notes (Signed)
 Agree with assessment and plan as outlined.

## 2024-06-24 NOTE — Patient Instructions (Signed)
 Your provider has requested that you go to the basement level for lab work before leaving today. Press B on the elevator. The lab is located at the first door on the left as you exit the elevator.  We have sent the following medications to your pharmacy for you to pick up at your convenience:  Linzess   Linzess  works best when taken once a day every day, on an empty stomach, at least 30 minutes before your first meal of the day.  When Linzess  is taken daily as directed:  *Constipation relief is typically felt in about a week *IBS-C patients may begin to experience relief from belly pain and overall abdominal symptoms (pain, discomfort, and bloating) in about 1 week,   with symptoms typically improving over 12 weeks.  Diarrhea may occur in the first 2 weeks -keep taking it.  The diarrhea should go away and you should start having normal, complete, full bowel movements. It may be helpful to start treatment when you can be near the comfort of your own bathroom, such as a weekend.    Take IB Gard twice a day as need for pain  Take Miralax  at bedtime as needed
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
## 2024-06-25 LAB — IGA: Immunoglobulin A: 243 mg/dL (ref 47–310)

## 2024-06-25 LAB — TISSUE TRANSGLUTAMINASE, IGA: (tTG) Ab, IgA: <1 U/mL

## 2024-06-26 ENCOUNTER — Ambulatory Visit (HOSPITAL_BASED_OUTPATIENT_CLINIC_OR_DEPARTMENT_OTHER): Payer: Self-pay | Admitting: Physical Therapy

## 2024-06-26 ENCOUNTER — Ambulatory Visit: Payer: Self-pay | Admitting: Nurse Practitioner

## 2024-06-27 ENCOUNTER — Telehealth (HOSPITAL_BASED_OUTPATIENT_CLINIC_OR_DEPARTMENT_OTHER): Payer: Self-pay | Admitting: Physical Therapy

## 2024-06-27 ENCOUNTER — Ambulatory Visit (HOSPITAL_BASED_OUTPATIENT_CLINIC_OR_DEPARTMENT_OTHER): Admitting: Physical Therapy

## 2024-06-27 NOTE — Telephone Encounter (Signed)
 Patient did not show for aquatic therapy PT appointment.  Called patient and spoke to her regarding missed appointment.  Patient apologized and stated she didn't have the appointment on the calendar.  Confirmed next 2 upcoming appointments with her.   Per attendance policy: If patient has 2 occasions of late cancellation (less than 24 hr notice) or no-show, patient will be allowed to schedule one appointment at a time.  After the 3rd incident of late cancellation or no-show, patient will be discharged and require a new referral from physician to resume treatment.   Delon Aquas, PTA 06/27/2024 12:11 PM [facility] [address] [place], [place], [address] Phone: [phone]   Fax:  [phone]

## 2024-06-30 ENCOUNTER — Encounter (HOSPITAL_BASED_OUTPATIENT_CLINIC_OR_DEPARTMENT_OTHER): Payer: Self-pay | Admitting: Physical Therapy

## 2024-06-30 ENCOUNTER — Ambulatory Visit (HOSPITAL_BASED_OUTPATIENT_CLINIC_OR_DEPARTMENT_OTHER): Admitting: Physical Therapy

## 2024-06-30 DIAGNOSIS — R2689 Other abnormalities of gait and mobility: Secondary | ICD-10-CM

## 2024-06-30 NOTE — Therapy (Signed)
 Pt arrived for appt but reported she started a new medicine recently and it is giving her some significant GI symptoms- she would rather we just hold off on PT today. No charge for today's visit.  Josette Rough, PT, DPT 06/30/2024 11:08 AM

## 2024-07-04 ENCOUNTER — Encounter: Payer: Self-pay | Admitting: *Deleted

## 2024-07-04 ENCOUNTER — Ambulatory Visit
Admission: EM | Admit: 2024-07-04 | Discharge: 2024-07-04 | Disposition: A | Discharge status: Home / Self Care | Attending: Emergency Medicine | Admitting: Emergency Medicine

## 2024-07-04 ENCOUNTER — Ambulatory Visit (HOSPITAL_BASED_OUTPATIENT_CLINIC_OR_DEPARTMENT_OTHER): Admitting: Physical Therapy

## 2024-07-04 DIAGNOSIS — H8111 Benign paroxysmal vertigo, right ear: Secondary | ICD-10-CM

## 2024-07-04 MED ORDER — MECLIZINE HCL 12.5 MG PO TABS: 12.5000 mg | ORAL_TABLET | Freq: Three times a day (TID) | ORAL | 0 refills | Status: AC | PRN

## 2024-07-04 NOTE — Discharge Instructions (Addendum)
 You were seen at urgent care today for concerns of dizziness. Based on your exam, you appear to be experiencing vertigo focused in on the right ear. This is a reassuring finding although obviously uncomfortable. I have started you on an antihistamine called meclizine  try to help with this vertigo sensation.  I would recommend following up closely with your primary care provider if symptoms or not improving.  You can attempt to perform the Epley maneuver at home to help with this right-sided discomfort.  This can also be performed by a physical therapist or ENT if you are unable to achieve significant improvement in your symptoms with this at home.  For concerns of significantly worsening symptoms such as severe headache, visual disturbance, loss of consciousness, or other symptoms, seek medical evaluation.
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
## 2024-07-04 NOTE — ED Triage Notes (Signed)
 Pt reports dizzy since 2pm. States symptoms are worse with movement or when she moves her head. States I feel right side heavy. Hx of POTS and has these symptoms at times, just not as continuously as today. Denies recent illness, cough, fever, n/v/d today. Reports she started Linzess  on Sunday and had some diarrhea earlier this week. Facial symmetry present, grips strong and equal, no drift, speech clear

## 2024-07-07 ENCOUNTER — Ambulatory Visit (INDEPENDENT_AMBULATORY_CARE_PROVIDER_SITE_OTHER): Admitting: Family Medicine

## 2024-07-07 VITALS — BP 110/60 | HR 78 | Ht 62.0 in | Wt 168.0 lb

## 2024-07-07 DIAGNOSIS — I951 Orthostatic hypotension: Secondary | ICD-10-CM

## 2024-07-07 DIAGNOSIS — G4701 Insomnia due to medical condition: Secondary | ICD-10-CM | POA: Diagnosis not present

## 2024-07-07 DIAGNOSIS — Q7962 Hypermobile Ehlers-Danlos syndrome: Secondary | ICD-10-CM

## 2024-07-07 DIAGNOSIS — H8110 Benign paroxysmal vertigo, unspecified ear: Secondary | ICD-10-CM | POA: Diagnosis not present

## 2024-07-07 MED ORDER — TRAZODONE HCL 50 MG PO TABS: 50.0000 mg | ORAL_TABLET | Freq: Every day | ORAL | 1 refills | Status: AC

## 2024-07-07 NOTE — Progress Notes (Unsigned)
"       ° °  I, Michelle [name] am a scribe for Dr. Artist Lloyd, MD.  Michelle [name] is a 33 y.o. 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 Pt was last seen by Dr. Lloyd on 06/05/24 and was prescribed tizanidine , midodrine , and advised to cont PT.  For dysautonomia in the interim she did not tolerate metoprolol  or midodrine  very well and has been switched to extended release propranolol  which works quite well.  He is happy with how that is going.  Today, pt reports that she is feeling pretty good today. Was at Urgent care Friday night from dizziness and headache. Urgent care stated that it was Vertigo. It went away completely yesterday. Joints are achy, but she thinks that might be due to the cold weather.  The vertigo resolved on its own.  She notes insomnia is still bothersome.  She did not tolerate Ambien  very well in the past.  She notes nortriptyline  has not helped.    Pertinent review of systems: No fevers or chills  Relevant historical information: Dysautonomia and EDS.   Exam:  BP 110/60   Pulse 78   Ht 5' 2 (1.575 m)   Wt 168 lb (76.2 kg)   LMP 08/14/2016   SpO2 99%   BMI 30.73 kg/m  General: Well Developed, well nourished, and in no acute distress.   MSK: Normal gait and coordination.    Lab and Radiology Results No results found for this or any previous visit (from the past 72 hours). No results found.     Assessment and Plan: 33 y.o. 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  Recently she had an episode of what sounds like BPPV.  This has resolved on its own.  If needed will refer to vestibular physical therapy.  She is currently attending PT at [facility].  If possible she can get the services there if needed.  From a dysautonomia perspective she is doing pretty well with extended release propranolol .  Continue current regimen.  Pain overall doing pretty okay on physical  therapy.  Watchful waiting for now.  Insomnia.  Will try trazodone .  Hold off on Ambien  if we could as she has had problems with that in the past.     PDMP not reviewed this encounter. No orders of the defined types were placed in this encounter.  Meds ordered this encounter  Medications   traZODone  (DESYREL ) 50 MG tablet    Sig: Take 1 tablet (50 mg total) by mouth at bedtime.    Dispense:  30 tablet    Refill:  1     Discussed warning signs or symptoms. Please see discharge instructions. Patient expresses understanding.   The above documentation has been reviewed and is accurate and complete Artist Lloyd, M.D.   "

## 2024-07-07 NOTE — Patient Instructions (Signed)
 Thank you for coming in today.   Let me know if the vertigo comes back. Next step would be vestibular PT.  Ask the PT at [facility] if they can do that.   Meclezine can help with the nausea from dizzy like sea sickness.   Try trazodone  at bedtime for sleep.   Recheck as needed. Let me know how you are doing.

## 2024-07-14 ENCOUNTER — Ambulatory Visit (HOSPITAL_BASED_OUTPATIENT_CLINIC_OR_DEPARTMENT_OTHER): Admitting: Physical Therapy

## 2024-07-16 ENCOUNTER — Ambulatory Visit (HOSPITAL_BASED_OUTPATIENT_CLINIC_OR_DEPARTMENT_OTHER): Admitting: Physical Therapy

## 2024-07-25 ENCOUNTER — Telehealth: Payer: Self-pay | Admitting: Family Medicine

## 2024-07-25 NOTE — Progress Notes (Unsigned)
 Complex Care Management Note Care Guide Note  07/25/2024 Name: Michelle [name] MRN: [id number] DOB: Aug 12, 1990   Complex Care Management Outreach Attempts: An unsuccessful telephone outreach was attempted today to offer the patient information about available complex care management services.  Follow Up Plan:  Additional outreach attempts will be made to offer the patient complex care management information and services.   Encounter Outcome:  No Answer  [facility]  [facility], [facility] Guide Direct Dial:   Fax: [phone]

## 2024-07-28 ENCOUNTER — Ambulatory Visit: Admitting: Family Medicine

## 2024-07-28 ENCOUNTER — Encounter: Payer: Self-pay | Admitting: Family Medicine

## 2024-07-28 VITALS — BP 104/68 | HR 85 | Ht 62.0 in | Wt 175.0 lb

## 2024-07-28 DIAGNOSIS — T148XXA Other injury of unspecified body region, initial encounter: Secondary | ICD-10-CM | POA: Diagnosis present

## 2024-07-28 NOTE — Patient Instructions (Addendum)
 1) Your pain is from muscle spasm of your sternocleidomastoid muscle (a muscle of your neck that helps with head positions). - Take tizanidine  about 1-2 hours before bedtime. This can decrease the spasm and help you sleep. It can make you drowsy so be careful. - During the day, you can continue tylenol  as needed and using heating pads to help the muscle strain - Do small range-of-motion exercises like moving your neck in small circles clockwise and counterclockwise to prevent your neck from freezing up.

## 2024-07-28 NOTE — Progress Notes (Signed)
" ° ° °  SUBJECTIVE:   CHIEF COMPLAINT / HPI:      Discussed the use of AI scribe software for clinical note transcription with the patient, who gave verbal consent to proceed.  History of Present Illness Michelle [name] is a 34 year old female with medullary sponge kidney who presents with neck pain.  Neck pain - Constant aching pain localized to the left side of the neck, under the jaw, present since Saturday morning - Occasional throbbing quality - Pain radiated towards the ear during one episode last night - Exacerbated by turning head to the right - No radiation except for the single episode - No recent physical strain, heavy lifting, or falls - No jaw pain or toothache - Swallowing is painless  Temporomandibular joint dysfunction - History of TMJ - Current pain is distinct from prior TMJ pain and located further down the neck  Pain management - Tylenol  used for pain without relief - No NSAIDs taken due to underlying kidney condition - Tizanidine  available at home but not used due to concerns about interaction with Linzess        PERTINENT  PMH / PSH: ehlers danlos  OBJECTIVE:   BP 104/68   Pulse 85   Ht 5' 2 (1.575 m)   Wt 175 lb (79.4 kg)   LMP 08/14/2016   SpO2 99%   BMI 32.01 kg/m   General: Alert, pleasant woman. NAD. HEENT:  TTP along L cleidosternomastroid. Pain with turning head to R; Normal active ROM on neck otherwise. CV: RRR, no murmurs. Cap refill <2. Resp: CTAB, no wheezing or crackles. Normal WOB on RA  Ext: Moves all ext spontaneously Skin: Warm, well perfused        ASSESSMENT/PLAN:   Assessment & Plan Muscle strain Hx and exam cw strain of sternocleidomastoid muscle on L. No signs of abscess or radiculopathy on exam. - Cont prn tizanidine  - Cont prn tylenol  - Avoid NSAIDs due to kidney diseaase - Encouraged heat and ROM exercises.     Michelle Nearing, MD [facility] "

## 2024-07-30 ENCOUNTER — Ambulatory Visit (INDEPENDENT_AMBULATORY_CARE_PROVIDER_SITE_OTHER): Payer: Self-pay | Admitting: Family Medicine

## 2024-07-30 ENCOUNTER — Other Ambulatory Visit: Payer: Self-pay | Admitting: Nurse Practitioner

## 2024-07-30 ENCOUNTER — Encounter: Payer: Self-pay | Admitting: Family Medicine

## 2024-07-30 VITALS — BP 107/67 | HR 90 | Wt 173.0 lb

## 2024-07-30 DIAGNOSIS — M542 Cervicalgia: Secondary | ICD-10-CM | POA: Diagnosis present

## 2024-07-30 MED ORDER — LIDOCAINE VISCOUS HCL 2 % MT SOLN: 5.0000 mL | Freq: Three times a day (TID) | OROMUCOSAL | 0 refills | Status: AC | PRN

## 2024-07-30 NOTE — Progress Notes (Signed)
" ° °  SUBJECTIVE:   CHIEF COMPLAINT / HPI:  Discussed the use of AI scribe software for clinical note transcription with the patient, who gave verbal consent to proceed.  History of Present Illness Michelle [name] is a 34 year old female with Ehlers-Danlos syndrome who presents with a sore spot in the back of her tongue.  Lingual and submandibular pain - Persistent sore spot in the back of the tongue since before Christmas - Focal pain under the jaw for several days - Pain worsens with certain tongue movements, turning the head, yawning, and sometimes chewing - Pain is localized to one area under the jaw - No visible swelling noted currently - No fever, rash, respiratory, or other cold symptoms - No awareness of neck trauma - No radicular symptoms  Lymphadenopathy history - Significant lymph node swelling two years ago with similar pain, but currently without visible swelling  Dental history - Prior tooth abscess requiring wisdom tooth removal - Current pain is lower in the neck and different from previous dental pain  Medical history - Ehlers-Danlos syndrome   OBJECTIVE:  BP 107/67   Pulse 90   Wt 173 lb (78.5 kg)   LMP 08/14/2016   SpO2 100%   BMI 31.64 kg/m   Physical Exam GENERAL: Alert, cooperative, well developed, no acute distress. HEENT: Normocephalic, normal oropharynx, moist mucous membranes, enlarged tongue papillae posterior tongue, no significant dental abnormalities NECK: No cervical lymphadenopathy, neck pain on right side SCM with palpation and with diagonal head movement, no step-offs or crepitus or pain to palpation of posterior neck/vertebrae NEUROLOGICAL: Cranial nerves grossly intact, moves all extremities without gross motor or sensory deficit, bilateral upper extremities with strength and sensation intact, no radicular symptoms with neck ROM  ASSESSMENT/PLAN:   Assessment & Plan Neck pain Likely MSK strain, though question some oral involvement given  tongue ulceration and enlarged papillae. Differential also includes tooth infection, but location atypical. Subluxation/skeletal issue considered with her Ehlers-Danlos syndrome but less likely based on exam and no trauma. - Prescribed magic mouthwash with lidocaine  for pain relief of the oral cavity. - Follow up with dentist. - Continue range of motion exercises. Can also use tizanidine  prn. - Consider ultrasound if symptoms persist.  Stuart Redo, MD [facility]  "

## 2024-07-30 NOTE — Patient Instructions (Addendum)
 I am going to send in some magic mouthwash for the ulcer on your tongue. This may be causing some of the pain, though I feel this is likely a muscle strain. Continue range of motion exercises per Dr. Elicia. I also recommend following with your dentist. Send me a mychart message if not improving.

## 2024-08-17 ENCOUNTER — Emergency Department (HOSPITAL_BASED_OUTPATIENT_CLINIC_OR_DEPARTMENT_OTHER)
Admission: EM | Admit: 2024-08-17 | Discharge: 2024-08-17 | Discharge status: Against Medical Advice | Attending: Emergency Medicine | Admitting: Emergency Medicine

## 2024-08-17 ENCOUNTER — Other Ambulatory Visit: Payer: Self-pay

## 2024-08-17 DIAGNOSIS — R1032 Left lower quadrant pain: Secondary | ICD-10-CM | POA: Insufficient documentation

## 2024-08-17 DIAGNOSIS — R110 Nausea: Secondary | ICD-10-CM | POA: Insufficient documentation

## 2024-08-17 DIAGNOSIS — Z5321 Procedure and treatment not carried out due to patient leaving prior to being seen by health care provider: Secondary | ICD-10-CM | POA: Diagnosis not present

## 2024-08-17 LAB — LIPASE, BLOOD: Lipase: 28 U/L (ref 11–51)

## 2024-08-17 LAB — CBC
HCT: 38.8 % (ref 36.0–46.0)
Hemoglobin: 13.4 g/dL (ref 12.0–15.0)
MCH: 31.3 pg (ref 26.0–34.0)
MCHC: 34.5 g/dL (ref 30.0–36.0)
MCV: 90.7 fL (ref 80.0–100.0)
Platelets: 300 10*3/uL (ref 150–400)
RBC: 4.28 MIL/uL (ref 3.87–5.11)
RDW: 11.7 % (ref 11.5–15.5)
WBC: 10.4 10*3/uL (ref 4.0–10.5)
nRBC: 0 % (ref 0.0–0.2)

## 2024-08-17 LAB — COMPREHENSIVE METABOLIC PANEL WITH GFR
ALT: 12 U/L (ref 0–44)
AST: 16 U/L (ref 15–41)
Albumin: 4.2 g/dL (ref 3.5–5.0)
Alkaline Phosphatase: 63 U/L (ref 38–126)
Anion gap: 11 (ref 5–15)
BUN: 18 mg/dL (ref 6–20)
CO2: 23 mmol/L (ref 22–32)
Calcium: 9.4 mg/dL (ref 8.9–10.3)
Chloride: 104 mmol/L (ref 98–111)
Creatinine, Ser: 0.72 mg/dL (ref 0.44–1.00)
GFR, Estimated: >60 mL/min
Glucose, Bld: 106 mg/dL — ABNORMAL HIGH (ref 70–99)
Potassium: 4.2 mmol/L (ref 3.5–5.1)
Sodium: 138 mmol/L (ref 135–145)
Total Bilirubin: 0.3 mg/dL (ref 0.0–1.2)
Total Protein: 7.2 g/dL (ref 6.5–8.1)

## 2024-08-17 NOTE — ED Triage Notes (Signed)
 Pt reports LLQ pain since this am, progressively worsening as the day has gone on.  Pt h/o of ovarian cysts and kidney stones.  Pt reports feeling like she is urinating less often than usual.  + nausea, -vomiting.  AAOx4 in triage.

## 2024-08-27 ENCOUNTER — Ambulatory Visit: Admitting: Nurse Practitioner

## 2024-11-26 ENCOUNTER — Ambulatory Visit: Admitting: Dermatology

## 2025-04-02 ENCOUNTER — Ambulatory Visit: Admitting: Neurology
